# Patient Record
Sex: Female | Born: 1935
Health system: Southern US, Community
[De-identification: ages and names within clinical notes are randomized; demographics above are authoritative.]

## PROBLEM LIST (undated history)

## (undated) DIAGNOSIS — I10 Essential (primary) hypertension: Secondary | ICD-10-CM

## (undated) DIAGNOSIS — E7800 Pure hypercholesterolemia, unspecified: Secondary | ICD-10-CM

## (undated) DIAGNOSIS — M810 Age-related osteoporosis without current pathological fracture: Secondary | ICD-10-CM

## (undated) DIAGNOSIS — IMO0002 Reserved for concepts with insufficient information to code with codable children: Secondary | ICD-10-CM

## (undated) DIAGNOSIS — Z460 Encounter for fitting and adjustment of spectacles and contact lenses: Secondary | ICD-10-CM

## (undated) DIAGNOSIS — N63 Unspecified lump in unspecified breast: Secondary | ICD-10-CM

## (undated) DIAGNOSIS — I739 Peripheral vascular disease, unspecified: Secondary | ICD-10-CM

## (undated) DIAGNOSIS — N6019 Diffuse cystic mastopathy of unspecified breast: Secondary | ICD-10-CM

## (undated) DIAGNOSIS — E039 Hypothyroidism, unspecified: Secondary | ICD-10-CM

## (undated) DIAGNOSIS — R0989 Other specified symptoms and signs involving the circulatory and respiratory systems: Secondary | ICD-10-CM

## (undated) DIAGNOSIS — R9430 Abnormal result of cardiovascular function study, unspecified: Secondary | ICD-10-CM

## (undated) DIAGNOSIS — I2510 Atherosclerotic heart disease of native coronary artery without angina pectoris: Secondary | ICD-10-CM

## (undated) DIAGNOSIS — E785 Hyperlipidemia, unspecified: Secondary | ICD-10-CM

## (undated) DIAGNOSIS — K602 Anal fissure, unspecified: Secondary | ICD-10-CM

## (undated) DIAGNOSIS — I779 Disorder of arteries and arterioles, unspecified: Secondary | ICD-10-CM

## (undated) HISTORY — DX: Hypothyroidism, unspecified: E03.9

## (undated) HISTORY — DX: Other specified symptoms and signs involving the circulatory and respiratory systems: R09.89

## (undated) HISTORY — DX: Reserved for concepts with insufficient information to code with codable children: IMO0002

## (undated) HISTORY — DX: Age-related osteoporosis without current pathological fracture: M81.0

## (undated) HISTORY — DX: Pure hypercholesterolemia, unspecified: E78.00

## (undated) HISTORY — DX: Essential (primary) hypertension: I10

## (undated) HISTORY — DX: Unspecified lump in unspecified breast: N63.0

## (undated) HISTORY — DX: Disorder of arteries and arterioles, unspecified: I77.9

## (undated) HISTORY — DX: Hyperlipidemia, unspecified: E78.5

## (undated) HISTORY — DX: Diffuse cystic mastopathy of unspecified breast: N60.19

## (undated) HISTORY — PX: RECTOPERITONEAL FISTULA CLOSURE: SHX2314

## (undated) HISTORY — DX: Anal fissure, unspecified: K60.2

## (undated) HISTORY — DX: Abnormal result of cardiovascular function study, unspecified: R94.30

## (undated) HISTORY — DX: Atherosclerotic heart disease of native coronary artery without angina pectoris: I25.10

## (undated) HISTORY — DX: Peripheral vascular disease, unspecified: I73.9

## (undated) HISTORY — PX: THYROID CYST EXCISION: SHX2511

---

## 1991-07-08 HISTORY — PX: CORONARY ANGIOPLASTY: SHX604

## 1998-04-05 ENCOUNTER — Ambulatory Visit (HOSPITAL_COMMUNITY): Admission: RE | Admit: 1998-04-05 | Discharge: 1998-04-05 | Payer: Self-pay | Admitting: *Deleted

## 1998-05-02 ENCOUNTER — Other Ambulatory Visit: Admission: RE | Admit: 1998-05-02 | Discharge: 1998-05-02 | Payer: Self-pay | Admitting: *Deleted

## 1999-04-26 ENCOUNTER — Ambulatory Visit (HOSPITAL_COMMUNITY): Admission: RE | Admit: 1999-04-26 | Discharge: 1999-04-26 | Payer: Self-pay | Admitting: *Deleted

## 1999-04-26 ENCOUNTER — Encounter: Payer: Self-pay | Admitting: *Deleted

## 1999-06-03 ENCOUNTER — Other Ambulatory Visit: Admission: RE | Admit: 1999-06-03 | Discharge: 1999-06-03 | Payer: Self-pay | Admitting: *Deleted

## 2000-05-06 ENCOUNTER — Ambulatory Visit (HOSPITAL_COMMUNITY): Admission: RE | Admit: 2000-05-06 | Discharge: 2000-05-06 | Payer: Self-pay | Admitting: *Deleted

## 2000-05-06 ENCOUNTER — Encounter: Payer: Self-pay | Admitting: *Deleted

## 2000-07-01 ENCOUNTER — Other Ambulatory Visit: Admission: RE | Admit: 2000-07-01 | Discharge: 2000-07-01 | Payer: Self-pay | Admitting: *Deleted

## 2001-07-26 ENCOUNTER — Ambulatory Visit (HOSPITAL_COMMUNITY): Admission: RE | Admit: 2001-07-26 | Discharge: 2001-07-26 | Payer: Self-pay | Admitting: *Deleted

## 2001-07-26 ENCOUNTER — Encounter: Payer: Self-pay | Admitting: *Deleted

## 2001-08-18 ENCOUNTER — Other Ambulatory Visit: Admission: RE | Admit: 2001-08-18 | Discharge: 2001-08-18 | Payer: Self-pay | Admitting: *Deleted

## 2002-09-23 ENCOUNTER — Other Ambulatory Visit: Admission: RE | Admit: 2002-09-23 | Discharge: 2002-09-23 | Payer: Self-pay | Admitting: *Deleted

## 2002-09-28 ENCOUNTER — Encounter: Payer: Self-pay | Admitting: *Deleted

## 2002-09-28 ENCOUNTER — Ambulatory Visit (HOSPITAL_COMMUNITY): Admission: RE | Admit: 2002-09-28 | Discharge: 2002-09-28 | Payer: Self-pay | Admitting: *Deleted

## 2003-10-17 ENCOUNTER — Ambulatory Visit (HOSPITAL_COMMUNITY): Admission: RE | Admit: 2003-10-17 | Discharge: 2003-10-17 | Payer: Self-pay | Admitting: *Deleted

## 2003-10-18 ENCOUNTER — Other Ambulatory Visit: Admission: RE | Admit: 2003-10-18 | Discharge: 2003-10-18 | Payer: Self-pay | Admitting: *Deleted

## 2004-03-21 ENCOUNTER — Encounter: Admission: RE | Admit: 2004-03-21 | Discharge: 2004-03-21 | Payer: Self-pay | Admitting: Internal Medicine

## 2004-06-03 ENCOUNTER — Ambulatory Visit: Payer: Self-pay | Admitting: Endocrinology

## 2004-08-22 ENCOUNTER — Ambulatory Visit: Payer: Self-pay

## 2004-12-31 ENCOUNTER — Ambulatory Visit (HOSPITAL_COMMUNITY): Admission: RE | Admit: 2004-12-31 | Discharge: 2004-12-31 | Payer: Self-pay | Admitting: *Deleted

## 2005-03-27 ENCOUNTER — Ambulatory Visit: Payer: Self-pay

## 2005-05-06 ENCOUNTER — Ambulatory Visit: Payer: Self-pay | Admitting: Endocrinology

## 2005-05-09 ENCOUNTER — Ambulatory Visit (HOSPITAL_COMMUNITY): Admission: RE | Admit: 2005-05-09 | Discharge: 2005-05-09 | Payer: Self-pay | Admitting: Obstetrics and Gynecology

## 2005-05-26 ENCOUNTER — Ambulatory Visit: Payer: Self-pay | Admitting: Cardiology

## 2005-07-15 ENCOUNTER — Ambulatory Visit: Payer: Self-pay

## 2005-07-15 ENCOUNTER — Encounter: Payer: Self-pay | Admitting: Cardiology

## 2005-11-05 ENCOUNTER — Encounter: Payer: Self-pay | Admitting: *Deleted

## 2006-03-18 ENCOUNTER — Ambulatory Visit (HOSPITAL_COMMUNITY): Admission: RE | Admit: 2006-03-18 | Discharge: 2006-03-18 | Payer: Self-pay | Admitting: Obstetrics & Gynecology

## 2006-04-15 ENCOUNTER — Ambulatory Visit: Payer: Self-pay

## 2006-05-11 ENCOUNTER — Ambulatory Visit: Payer: Self-pay | Admitting: Endocrinology

## 2006-06-02 ENCOUNTER — Ambulatory Visit: Payer: Self-pay | Admitting: Cardiology

## 2007-04-05 ENCOUNTER — Encounter: Payer: Self-pay | Admitting: *Deleted

## 2007-04-05 DIAGNOSIS — E039 Hypothyroidism, unspecified: Secondary | ICD-10-CM | POA: Insufficient documentation

## 2007-04-05 DIAGNOSIS — E041 Nontoxic single thyroid nodule: Secondary | ICD-10-CM | POA: Insufficient documentation

## 2007-04-16 ENCOUNTER — Ambulatory Visit: Payer: Self-pay | Admitting: Internal Medicine

## 2007-05-18 ENCOUNTER — Encounter: Payer: Self-pay | Admitting: Cardiology

## 2007-05-18 ENCOUNTER — Ambulatory Visit: Payer: Self-pay

## 2007-05-21 ENCOUNTER — Ambulatory Visit: Payer: Self-pay | Admitting: Cardiology

## 2007-06-10 ENCOUNTER — Ambulatory Visit (HOSPITAL_COMMUNITY): Admission: RE | Admit: 2007-06-10 | Discharge: 2007-06-10 | Payer: Self-pay | Admitting: Obstetrics & Gynecology

## 2007-07-19 ENCOUNTER — Ambulatory Visit: Payer: Self-pay | Admitting: Internal Medicine

## 2007-07-19 DIAGNOSIS — N63 Unspecified lump in unspecified breast: Secondary | ICD-10-CM

## 2007-07-19 HISTORY — DX: Unspecified lump in unspecified breast: N63.0

## 2007-07-19 LAB — CONVERTED CEMR LAB
ALT: 29 units/L (ref 0–35)
Alkaline Phosphatase: 51 units/L (ref 39–117)
BUN: 19 mg/dL (ref 6–23)
Basophils Absolute: 0 10*3/uL (ref 0.0–0.1)
Basophils Relative: 0.1 % (ref 0.0–1.0)
Bilirubin Urine: NEGATIVE
CO2: 28 meq/L (ref 19–32)
Calcium: 9.6 mg/dL (ref 8.4–10.5)
Chloride: 105 meq/L (ref 96–112)
Creatinine, Ser: 0.7 mg/dL (ref 0.4–1.2)
Eosinophils Relative: 0.2 % (ref 0.0–5.0)
GFR calc Af Amer: 106 mL/min
Glucose, Bld: 109 mg/dL — ABNORMAL HIGH (ref 70–99)
Glucose, Urine, Semiquant: NEGATIVE
HCT: 38.4 % (ref 36.0–46.0)
Lymphocytes Relative: 15 % (ref 12.0–46.0)
MCV: 96.1 fL (ref 78.0–100.0)
Neutro Abs: 4.6 10*3/uL (ref 1.4–7.7)
Nitrite: NEGATIVE
Platelets: 277 10*3/uL (ref 150–400)
Potassium: 5.4 meq/L — ABNORMAL HIGH (ref 3.5–5.1)
TSH: 1 microintl units/mL (ref 0.35–5.50)
Total CHOL/HDL Ratio: 5
Total Protein: 6.8 g/dL (ref 6.0–8.3)
Urobilinogen, UA: 0.2
VLDL: 9 mg/dL (ref 0–40)
WBC: 5.8 10*3/uL (ref 4.5–10.5)
pH: 5

## 2007-07-21 ENCOUNTER — Encounter: Payer: Self-pay | Admitting: Internal Medicine

## 2007-07-21 ENCOUNTER — Ambulatory Visit: Payer: Self-pay | Admitting: Internal Medicine

## 2007-07-21 DIAGNOSIS — R7309 Other abnormal glucose: Secondary | ICD-10-CM | POA: Insufficient documentation

## 2007-08-05 ENCOUNTER — Other Ambulatory Visit: Admission: RE | Admit: 2007-08-05 | Discharge: 2007-08-05 | Payer: Self-pay | Admitting: Obstetrics & Gynecology

## 2007-08-09 ENCOUNTER — Telehealth: Payer: Self-pay | Admitting: Internal Medicine

## 2008-04-07 ENCOUNTER — Ambulatory Visit: Payer: Self-pay | Admitting: Internal Medicine

## 2008-04-07 DIAGNOSIS — M899 Disorder of bone, unspecified: Secondary | ICD-10-CM | POA: Insufficient documentation

## 2008-04-07 DIAGNOSIS — M949 Disorder of cartilage, unspecified: Secondary | ICD-10-CM

## 2008-04-07 DIAGNOSIS — R030 Elevated blood-pressure reading, without diagnosis of hypertension: Secondary | ICD-10-CM | POA: Insufficient documentation

## 2008-05-29 ENCOUNTER — Ambulatory Visit: Payer: Self-pay

## 2008-05-29 ENCOUNTER — Ambulatory Visit: Payer: Self-pay | Admitting: Cardiology

## 2008-07-20 ENCOUNTER — Ambulatory Visit (HOSPITAL_COMMUNITY): Admission: RE | Admit: 2008-07-20 | Discharge: 2008-07-20 | Payer: Self-pay | Admitting: Obstetrics & Gynecology

## 2008-08-23 ENCOUNTER — Ambulatory Visit: Payer: Self-pay | Admitting: Internal Medicine

## 2008-10-04 LAB — CONVERTED CEMR LAB
BUN: 18 mg/dL (ref 6–23)
Basophils Relative: 0.3 % (ref 0.0–3.0)
CO2: 29 meq/L (ref 19–32)
Calcium: 9.3 mg/dL (ref 8.4–10.5)
Chloride: 105 meq/L (ref 96–112)
Creatinine, Ser: 0.6 mg/dL (ref 0.4–1.2)
Direct LDL: 148.9 mg/dL
Eosinophils Absolute: 0 10*3/uL (ref 0.0–0.7)
GFR calc non Af Amer: 105 mL/min
Glucose, Bld: 93 mg/dL (ref 70–99)
HDL: 44.5 mg/dL (ref >39.0)
Hemoglobin: 13.4 g/dL (ref 12.0–15.0)
Hgb A1c MFr Bld: 5.6 % (ref 4.6–6.0)
Lymphocytes Relative: 27.4 % (ref 12.0–46.0)
Monocytes Absolute: 0.3 10*3/uL (ref 0.1–1.0)
Monocytes Relative: 7 % (ref 3.0–12.0)
Potassium: 4.9 meq/L (ref 3.5–5.1)
Sodium: 141 meq/L (ref 135–145)
Total Bilirubin: 1 mg/dL (ref 0.3–1.2)
VLDL: 14 mg/dL (ref 0–40)
WBC: 4.8 10*3/uL (ref 4.5–10.5)

## 2008-10-16 ENCOUNTER — Ambulatory Visit: Payer: Self-pay | Admitting: Internal Medicine

## 2008-12-06 ENCOUNTER — Ambulatory Visit: Payer: Self-pay | Admitting: Cardiology

## 2009-01-04 ENCOUNTER — Telehealth (INDEPENDENT_AMBULATORY_CARE_PROVIDER_SITE_OTHER): Payer: Self-pay | Admitting: *Deleted

## 2009-01-04 LAB — CONVERTED CEMR LAB: Pap Smear: NORMAL

## 2009-06-04 ENCOUNTER — Encounter: Payer: Self-pay | Admitting: Cardiology

## 2009-06-05 ENCOUNTER — Ambulatory Visit: Payer: Self-pay | Admitting: Cardiology

## 2009-07-27 ENCOUNTER — Ambulatory Visit: Payer: Self-pay | Admitting: Internal Medicine

## 2009-08-27 ENCOUNTER — Ambulatory Visit (HOSPITAL_COMMUNITY): Admission: RE | Admit: 2009-08-27 | Discharge: 2009-08-27 | Payer: Self-pay | Admitting: Obstetrics & Gynecology

## 2009-12-05 ENCOUNTER — Encounter: Payer: Self-pay | Admitting: Cardiology

## 2009-12-06 ENCOUNTER — Ambulatory Visit: Payer: Self-pay

## 2009-12-06 ENCOUNTER — Encounter: Payer: Self-pay | Admitting: Cardiology

## 2010-01-16 ENCOUNTER — Ambulatory Visit: Payer: Self-pay | Admitting: Internal Medicine

## 2010-01-21 LAB — CONVERTED CEMR LAB
Albumin: 4.5 g/dL (ref 3.5–5.2)
Alkaline Phosphatase: 54 units/L (ref 39–117)
CO2: 29 meq/L (ref 19–32)
Cholesterol: 204 mg/dL — ABNORMAL HIGH (ref 0–200)
GFR calc non Af Amer: 110.59 mL/min (ref >60)
Glucose, Bld: 104 mg/dL — ABNORMAL HIGH (ref 70–99)
HCT: 40.5 % (ref 36.0–46.0)
Lymphocytes Relative: 20.4 % (ref 12.0–46.0)
MCHC: 33.9 g/dL (ref 30.0–36.0)
MCV: 99.4 fL (ref 78.0–100.0)
Monocytes Relative: 6.4 % (ref 3.0–12.0)
Neutro Abs: 3.7 10*3/uL (ref 1.4–7.7)
Potassium: 5.3 meq/L — ABNORMAL HIGH (ref 3.5–5.1)
RBC: 4.07 M/uL (ref 3.87–5.11)
RDW: 13.2 % (ref 11.5–14.6)
TSH: 0.7 microintl units/mL (ref 0.35–5.50)
Total CHOL/HDL Ratio: 4
Triglycerides: 62 mg/dL (ref 0.0–149.0)
VLDL: 12.4 mg/dL (ref 0.0–40.0)

## 2010-06-19 ENCOUNTER — Ambulatory Visit: Payer: Self-pay | Admitting: Internal Medicine

## 2010-07-10 ENCOUNTER — Encounter: Payer: Self-pay | Admitting: Cardiology

## 2010-07-11 ENCOUNTER — Encounter: Payer: Self-pay | Admitting: Cardiology

## 2010-07-11 ENCOUNTER — Ambulatory Visit
Admission: RE | Admit: 2010-07-11 | Discharge: 2010-07-11 | Payer: Self-pay | Source: Home / Self Care | Attending: Cardiology | Admitting: Cardiology

## 2010-07-28 ENCOUNTER — Encounter: Payer: Self-pay | Admitting: Obstetrics & Gynecology

## 2010-08-08 NOTE — Miscellaneous (Signed)
  Clinical Lists Changes  Observations: Added new observation of PAST MED HX: CAD.... Marland KitchenPCI to the right coronary artery and diagonal in the remote past. Residual 70% LAD lesion....  /  Myoview 2007... no ischemia Hyperlipidemia Hypothyroidism Carotid artery disease.Marland KitchenMarland KitchenDoppler... June, 2010... 40-59% LICA, 60-79% RICA  /  doppler..12/2009..stable 60-79%RICA, 40-59% LICA Surgery for thyroid cyst in the past. Thyroid nodules followed by Dr. Everardo All Increased pulsation  in the abdomen.. no abdominal aneurysm by ultrasound EF   67% by Myoview scan January 2007 Hypertension.. white coat  Fosamax indigestion.  after hrt   age 76   (07/10/2010 15:53) Added new observation of PRIMARY MD: [name] MD (07/10/2010 15:53)       Past History:  Past Medical History: CAD.... Marland KitchenPCI to the right coronary artery and diagonal in the remote past. Residual 70% LAD lesion....  /  Myoview 2007... no ischemia Hyperlipidemia Hypothyroidism Carotid artery disease.Marland KitchenMarland KitchenDoppler... June, 2010... 40-59% LICA, 60-79% RICA  /  doppler..12/2009..stable 60-79%RICA, 40-59% LICA Surgery for thyroid cyst in the past. Thyroid nodules followed by Dr. Everardo All Increased pulsation  in the abdomen.. no abdominal aneurysm by ultrasound EF   67% by Myoview scan January 2007 Hypertension.. white coat  Fosamax indigestion.  after hrt   age 47

## 2010-08-08 NOTE — Assessment & Plan Note (Signed)
Summary: flu shot/cjr   Nurse Visit   Allergies: No Known Drug Allergies  Orders Added: 1)  Admin 1st Vaccine [90471] 2)  Flu Vaccine 53yrs + [95621]    Flu Vaccine Consent Questions     Do you have a history of severe allergic reactions to this vaccine? no    Any prior history of allergic reactions to egg and/or gelatin? no    Do you have a sensitivity to the preservative Thimersol? no    Do you have a past history of Guillan-Barre Syndrome? no    Do you currently have an acute febrile illness? no    Have you ever had a severe reaction to latex? no    Vaccine information given and explained to patient? yes    Are you currently pregnant? no    Lot Number:[id number]   Exp Date:01/04/2011   Site Given  Left Deltoid IM Romualdo Bolk, CMA (AAMA)  June 19, 2010 1:21 PM

## 2010-08-08 NOTE — Assessment & Plan Note (Signed)
Summary: emp/pt fasting/cjr   Vital Signs:  Patient profile:   76 year old female Menstrual status:  postmenopausal Height:      63 inches Weight:      111 pounds Pulse rate:   100 / minute BP sitting:   170 / 80  (left arm) Cuff size:   regular  Vitals Entered By: Romualdo Bolk, CMA (AAMA) (January 16, 2010 9:30 AM) CC: Annual Visit for Disease Management- No pap- Pt has a gyn who does paps   History of Present Illness: Natasha Harper cone ins for yearly check     1.   Risk factors based on Past M, S, F history: 2.   Physical Activities:  walk  swim  play  3.   Depression/mood:   Over all this . doing well 4.   Hearing:  good  5.   ADL's:   independent  does all housework  6.   Fall Risk:   none and no risk  wallks   about 2 miles each day.  7.   Home Safety:    no falls  8.   Height, weight, &visual acuity: vision  seeing     well  had vision check   9.   Counseling:  declines  colonscopy  10.   Labs ordered based on risk factors:  11.           Referral Coordination 12.           Care Plan 13.            Cognitive Assessment  Pt is A&Ox3,affect,speech,memory,attention,&motor skills appear intact.   Since last visit  here  there have been no major changes in health status   .  Bp  120 at home  very elevated in all office areas and cuff has been check ed also.  Dr Hyacinth Meeker stool card  get reg   checks  Thyoird no change in meds  LIPIDs: mds per Dr Myrtis Ser no reported se.      Preventive Care Screening  Mammogram:    Date:  11/04/2009    Results:  normal   Pap Smear:    Date:  01/04/2009    Results:  normal   Last Tetanus Booster:    Date:  07/07/2004    Results:  Historical    Contraindications/Deferment of Procedures/Staging:    Test/Procedure: Colonoscopy    Reason for deferment: patient declined   Preventive Screening-Counseling & Management  Alcohol-Tobacco     Alcohol drinks/day: <1     Alcohol type: wine      Smoking Status: quit     Year  Quit: 45 years ago  Caffeine-Diet-Exercise     Caffeine use/day: less than 1 a year     Does Patient Exercise: yes     Type of exercise: walking     Times/week: 7  Hep-HIV-STD-Contraception     Dental Visit-last 6 months yes     Sun Exposure-Excessive: no  Safety-Violence-Falls     Seat Belt Use: yes     Firearms in the Home: no firearms in the home     Smoke Detectors: yes     Fall Risk: no risk     Fall Risk Counseling: not indicated; no significant falls noted      Blood Transfusions:  no.    Current Medications (verified): 1)  Lipitor 80 Mg  Tabs (Atorvastatin Calcium) .... Take 1 By Mouth Qd 2)  Levothyroxine Sodium 50 Mcg  Tabs (  Levothyroxine Sodium) .... Take 1 By Mouth Qd 3)  Baby Aspirin 81 Mg  Chew (Aspirin) 4)  One-A-Day Extras Antioxidant   Caps (Multiple Vitamins-Minerals) 5)  Fish Oil   Oil (Fish Oil) .... Once Daily  Allergies (verified): No Known Drug Allergies  Past History:  Past medical, surgical, family and social histories (including risk factors) reviewed, and no changes noted (except as noted below).  Past Medical History: CAD.... Marland KitchenPCI to the right coronary artery and diagonal in the remote past. Residual 70% LAD lesion....  /  Myoview 2007... no ischemia Hyperlipidemia Hypothyroidism Carotid artery disease.Marland KitchenMarland KitchenDoppler... June, 2010... 40-59% LICA, 60-79% RICA Surgery for thyroid cyst in the past. Thyroid nodules followed by Dr. Everardo All Increased pulsation  in the abdomen.. no abdominal aneurysm by ultrasound EF   67% by Myoview scan January 2007 Hypertension.. white coat  Fosamax indigestion.  after hrt   age 48   Past Surgical History: Reviewed history from 04/07/2008 and no changes required. Removed thyroid cyst   1980s Percutaneous transluminal coronary angioplasty Stress Cardiolite (07/15/2005)  Past History:  Care Management: Cardiology Myrtis Ser Endocrinology: Everardo All Gynecology: Hyacinth Meeker  Family History: Reviewed history from 04/07/2008  and no changes required. Father died 73's MI Mom died 34 in her sleep.  No fam hx DM  No Cancer.  Osteoporosis and falling in mom  Social History: Reviewed history from 10/16/2008 and no changes required. Former Smoker  stopped in the 60s  Alcohol use-yes  1/ week .   Drug use-no Regular exercise-yes 2 miles  per days. Married sleep  is good  HH of 2    no pets  family was from [place]. Caffeine use/day:  less than 1 a year Seat Belt Use:  yes Dental Care w/in 6 mos.:  yes Sun Exposure-Excessive:  no Fall Risk:  no risk Blood Transfusions:  no  Review of Systems  The patient denies anorexia, fever, weight loss, weight gain, vision loss, decreased hearing, dyspnea on exertion, prolonged cough, abdominal pain, melena, hematochezia, severe indigestion/heartburn, hematuria, transient blindness, difficulty walking, depression, enlarged lymph nodes, angioedema, and breast masses.    Physical Exam  General:  Well-developed,well-nourished,in no acute distress; alert,appropriate and cooperative throughout examination Head:  normocephalic and atraumatic.   Eyes:  vision grossly intact and pupils equal.  eoms nl Ears:  R ear normal, L ear normal, and no external deformities.   Nose:  no external deformity, no external erythema, and no nasal discharge.   Mouth:  pharynx pink and moist.   Neck:  supple.  no new masses  Breasts:  No mass, nodules, thickening, tenderness, bulging, retraction, inflamation, nipple discharge or skin changes noted.   Lungs:  Normal respiratory effort, chest expands symmetrically. Lungs are clear to auscultation, no crackles or wheezes.no dullness.   Heart:  Normal rate and regular rhythm. S1 and S2 normal without gallop, murmur, click, rub or other extra sounds.no lifts.   Abdomen:  Bowel sounds positive,abdomen soft and non-tender without masses, organomegaly or  noted. Genitalia:  per gyne Msk:  normal ROM, no joint warmth, and no redness over joints.     Pulses:  nl pedal pulses without delay or bruit  Extremities:  no clubbing cyanosis or edema  Neurologic:  non focal  alert & oriented X3, gait normal, and DTRs symmetrical and normal.   Pt is A&Ox3,affect,speech,memory,attention,&motor skills appear intact.  Skin:  turgor normal, color normal, no ecchymoses, and no petechiae.   Cervical Nodes:  No lymphadenopathy noted Axillary Nodes:  No palpable lymphadenopathy  Psych:  Oriented X3, normally interactive, good eye contact, and minimally  anxious.     Impression & Recommendations:  Problem # 1:  PREVENTIVE HEALTH CARE (ICD-V70.0) counseled   declines colon   Problem # 2:  HYPERTENSION, WHITE COAT (ICD-796.2) see notes  from past  pat  again reports   normal readings at home or when not in a medical facility   Orders: TLB-BMP (Basic Metabolic Panel-BMET) (80048-METABOL)  Problem # 3:  HYPOTHYROIDISM (ICD-244.9)  Her updated medication list for this problem includes:    Levothyroxine Sodium 50 Mcg Tabs (Levothyroxine sodium) .Marland Kitchen... Take 1 by mouth qd  Orders: TLB-TSH (Thyroid Stimulating Hormone) (84443-TSH) Venipuncture (41324) Prescription Created Electronically [phone])  Labs Reviewed: TSH: 0.96 (08/23/2008)    HgBA1c: 5.6 (08/23/2008) Chol: 210 (08/23/2008)   HDL: 44.5 (08/23/2008)   LDL: DEL (08/23/2008)   TG: 69 (08/23/2008)  Problem # 4:  THYROID NODULE (ICD-241.0)  Orders: TLB-TSH (Thyroid Stimulating Hormone) (84443-TSH)  Problem # 5:  HYPERLIPIDEMIA (ICD-272.4)  Her updated medication list for this problem includes:    Lipitor 80 Mg Tabs (Atorvastatin calcium) .Marland Kitchen... Take 1 by mouth qd  Orders: TLB-Hepatic/Liver Function Pnl (80076-HEPATIC) TLB-Lipid Panel (80061-LIPID) Venipuncture (72536)  Labs Reviewed: SGOT: 28 (08/23/2008)   SGPT: 29 (08/23/2008)   HDL:44.5 (08/23/2008), 42.6 (07/19/2007)  LDL:DEL (08/23/2008), DEL (07/19/2007)  Chol:210 (08/23/2008), 213 (07/19/2007)  Trig:69 (08/23/2008), 44  (07/19/2007)  Problem # 6:  CORONARY ARTERY DISEASE (ICD-414.00)  Her updated medication list for this problem includes:    Baby Aspirin 81 Mg Chew (Aspirin)  Orders: TLB-Lipid Panel (80061-LIPID)  Labs Reviewed: Chol: 210 (08/23/2008)   HDL: 44.5 (08/23/2008)   LDL: DEL (08/23/2008)   TG: 69 (08/23/2008)  Problem # 7:  bone health high risk with family hx and age habitus and thyroid disease but she will not take meds for this  so    dexa t done by her GYne    . consider dexa   for information .  Complete Medication List: 1)  Lipitor 80 Mg Tabs (Atorvastatin calcium) .... Take 1 by mouth qd 2)  Levothyroxine Sodium 50 Mcg Tabs (Levothyroxine sodium) .... Take 1 by mouth qd 3)  Baby Aspirin 81 Mg Chew (Aspirin) 4)  One-a-day Extras Antioxidant Caps (Multiple vitamins-minerals) 5)  Fish Oil Oil (Fish oil) .... Once daily  Other Orders: TLB-CBC Platelet - w/Differential (85025-CBCD) T-Vitamin D (25-Hydroxy) (64403-47425)  Patient Instructions: 1)  You will be informed of lab results when available.  2)  Continue minitoring your Blood pressure  and call or follow up if elevated 150 and above .  3)  Continue healthy eating and exercise . Prescriptions: LEVOTHYROXINE SODIUM 50 MCG  TABS (LEVOTHYROXINE SODIUM) take 1 by mouth qd  #90 Tablet x 3   Entered and Authorized by:   [name] MD   Signed by:   [name] MD on 01/16/2010   Method used:   Electronically to        [facility]* (retail)             ,          Ph: [phone]       Fax: [phone]   RxID:   [id number] LIPITOR 80 MG  TABS (ATORVASTATIN CALCIUM) take 1 by mouth qd  #90 x 3   Entered and Authorized by:   [name] MD   Signed by:   [name] MD on 01/16/2010   Method used:  Electronically to        [facility]* (retail)             ,          Ph: [phone]       Fax: [phone]   RxID:   [id number]    Eye Exam  Last eye exam 2 years ago- Normal- Done in  [place]. Romualdo Bolk, CMA (AAMA)  January 16, 2010 9:36 AM

## 2010-08-08 NOTE — Assessment & Plan Note (Signed)
Summary: flu shot/cjr   Nurse Visit   Allergies: No Known Drug Allergies  Orders Added: 1)  Admin 1st Vaccine [90471] 2)  Flu Vaccine 19yrs + [40347]   Flu Vaccine Consent Questions     Do you have a history of severe allergic reactions to this vaccine? no    Any prior history of allergic reactions to egg and/or gelatin? no    Do you have a sensitivity to the preservative Thimersol? no    Do you have a past history of Guillan-Barre Syndrome? no    Do you currently have an acute febrile illness? no    Have you ever had a severe reaction to latex? no    Vaccine information given and explained to patient? yes    Are you currently pregnant? no    Lot Number:[id number]   Exp Date:01/03/2010   Site Given  Left Deltoid IM Romualdo Bolk, CMA [facility])  July 27, 2009 11:34 AM

## 2010-08-08 NOTE — Assessment & Plan Note (Signed)
Summary: Z6X      Allergies Added: NKDA  Visit Type:  Follow-up Primary Provider:  [name] MD  CC:  CAD.  History of Present Illness: The patient is seen for followup of coronary artery disease.  She has done very well.  I saw her last November, 2010.  In June, 2011, she had carotid Doppler showing stable disease.  She's not having any chest pain.  Her Myoview was done last in 2007.  There is normal LV function.  Current Medications (verified): 1)  Lipitor 80 Mg  Tabs (Atorvastatin Calcium) .... Take 1 By Mouth Qd 2)  Levothyroxine Sodium 50 Mcg  Tabs (Levothyroxine Sodium) .... Take 1 By Mouth Qd 3)  Baby Aspirin 81 Mg  Chew (Aspirin) 4)  One-A-Day Extras Antioxidant   Caps (Multiple Vitamins-Minerals) 5)  Fish Oil   Oil (Fish Oil) .... Once Daily  Allergies (verified): No Known Drug Allergies  Past History:  Past Medical History: CAD.... Marland KitchenPCI to the right coronary artery and diagonal in the remote past. Residual 70% LAD lesion....  /  Myoview 2007... no ischemia Hyperlipidemia .Marland KitchenMarland KitchenLDL remains high on Lipitor 80, but patient had stent she'll drop in LDL from Lipitor Hypothyroidism Carotid artery disease.Marland KitchenMarland KitchenDoppler... June, 2010... 40-59% LICA, 60-79% RICA  /  doppler..12/2009..stable 60-79%RICA, 40-59% LICA Surgery for thyroid cyst in the past. Thyroid nodules followed by Dr. Everardo All Increased pulsation  in the abdomen.. no abdominal aneurysm by ultrasound EF   67% by Myoview scan January 2007 Hypertension.. white coat  Fosamax indigestion.  after hrt   age 42   Review of Systems       Patient denies fever, chills, headache, sweats, rash, change in vision, change in hearing, chest pain, cough, nausea vomiting, urinary symptoms.  All other systems are reviewed and are negative  Vital Signs:  Patient profile:   76 year old female Menstrual status:  postmenopausal Height:      63 inches Weight:      112 pounds BMI:     19.91 Pulse rate:   83 / minute BP sitting:    154 / 62  (left arm) Cuff size:   regular  Vitals Entered By: Hardin Negus, RMA (July 11, 2010 9:55 AM)  Physical Exam  General:  patient looks great. Eyes:  no xanthelasma. Neck:  no jugular venous distention. Lungs:  lungs are clear.  Respiratory effort is nonlabored. Heart:  cardiac exam reveals S1 and S2.  No clicks or significant murmurs. Abdomen:  abdomen soft. Extremities:  no peripheral edema. Psych:  patient is oriented to person time and place.  Affect is normal.   Impression & Recommendations:  Problem # 1:  HYPERTENSION, WHITE COAT (ICD-796.2) Blood pressure was 120 systolic at home this morning.  She has white coat hypertension.  Her systolic of 154 here in the office today is better than usual.  No change in therapy.  Problem # 2:  HYPOTHYROIDISM (ICD-244.9)  Her updated medication list for this problem includes:    Levothyroxine Sodium 50 Mcg Tabs (Levothyroxine sodium) .Marland Kitchen... Take 1 by mouth qd Thyroid is treated.  Problem # 3:  CAROTID ARTERY STENOSIS, WITHOUT INFARCTION (ICD-433.10)  Her updated medication list for this problem includes:    Baby Aspirin 81 Mg Chew (Aspirin) Carotids are followed very carefully with Dopplers.  Problem # 4:  HYPERLIPIDEMIA (ICD-272.4)  Her updated medication list for this problem includes:    Lipitor 80 Mg Tabs (Atorvastatin calcium) .Marland Kitchen... Take 1 by mouth qd  The patient takes Lipitor 80 mg.  When this was started in the past she had a very high percentage dropped and her LDL.  Her LDL remains elevated but I feel that we should not change medicines further.  Problem # 5:  CORONARY ARTERY DISEASE (ICD-414.00)  Her updated medication list for this problem includes:    Baby Aspirin 81 Mg Chew (Aspirin)  Orders: EKG w/ Interpretation (93000) Coronary disease is stable.  EKG is done today and reviewed by me.  There is no significant abnormality.  No further workup is needed.  I'll see her back in one year.  Patient  Instructions: 1)  Your physician wants you to follow-up in:  1 year.  You will receive a reminder letter in the mail two months in advance. If you don't receive a letter, please call our office to schedule the follow-up appointment.

## 2010-08-08 NOTE — Miscellaneous (Signed)
Summary: Orders Update  Clinical Lists Changes  Orders: Added new Test order of Carotid Duplex (Carotid Duplex) - Signed 

## 2010-11-19 NOTE — Assessment & Plan Note (Signed)
[facility]                            CARDIOLOGY OFFICE NOTE   NAME:Breighner, ROBERTTA HALFHILL                      MRN:          [id number]  DATE:05/29/2008                            DOB:          08/03/1934    Ms. Graul is doing very well.  She is not having any significant  symptoms.  She has known coronary disease post intervention of the right  and diagonal in the past with residual LAD disease.  In 2007, she had a  negative Myoview scan.  We are following her carotids.  She is not  having any significant chest pain.  She is on high-dose Lipitor.  She is  on aspirin.   The patient is having no chest pain or shortness of breath.  She has had  no syncope or presyncope.   ALLERGIES:  No known drug allergies.   MEDICATIONS:  1. Lipitor 80.  2. Thyroxine 50 mcg.  3. Multivitamin.  4. Aspirin.  5. Fish oil.   OTHER MEDICAL PROBLEMS:  See the list on my note of May 21, 2007,  and see the EMR flow sheet.   REVIEW OF SYSTEMS:  She is not having any fevers or chills.  She has had  no skin rashes.  She has no GI or GU symptoms.   PHYSICAL EXAMINATION:  The patient brings blood pressures from home  today.  She has elevated pressure when she comes into the office, but  her pressures at home are very clearly documented in the normal range.  I have several days worth of pressures.  Her pressures run in the range  of 115-125 over 60-75.  This is quite good, and there is no reason to  change her meds.  The patient is oriented to person, time, and place.  Affect is normal.  HEENT reveals no xanthelasma.  She has normal  extraocular motion.  There are no carotid bruits.  There is no jugular  venous distention.  Lungs are clear.  Respiratory effort is not labored.  Cardiac exam reveals an S1 with an S2.  There are no clicks or  significant murmurs.  Her abdomen is soft.  She has no peripheral edema.   EKG reveals no significant change.   Ms. Schelling is  quite stable.  Her coronary disease is stable.  Her  problems are listed on the note of May 21, 2007.  No change in her  meds.     Luis Abed, MD, [facility]  Electronically Signed    JDK/MedQ  DD: 05/29/2008  DT: 05/29/2008  Job #: [id number]   cc:   Neta Mends. Fabian Sharp, MD

## 2010-11-19 NOTE — Assessment & Plan Note (Signed)
[facility]                            CARDIOLOGY OFFICE NOTE   NAME:Natasha Harper, Natasha Harper                      MRN:          [id number]  DATE:05/21/2007                            DOB:          February 07, 1935    Natasha Harper is doing well.  She has known coronary disease but has been  very stable.  She had an intervention of the right coronary in the past.  She has residual 70% LAD lesion.  She is not having chest pain.  Her  Myoview in January 2007, showed no ischemia.  She did receive aggressive  secondary prevention.   There has been a question of elevated blood pressure.  She insists that  she becomes anxious only when she comes to the physician's office.  The  rest of the time she has no problem.  In fact, when she had her carotid  Doppler, her systolic was in the range of 140.  Today her blood pressure  is clearly elevated.  We will encourage her to obtain a home blood  pressure cuff and to call in her pressures to [place] and to comes back for  followup pressures and then she will give this information to Dr. Fabian Sharp  and we can decide going forward if she needs to be treated.   PAST MEDICAL HISTORY:   ALLERGIES:  No known drug allergies.   MEDICATIONS:  1. Lipitor 80.  2. Thyroid 50 mcg.  3. Multivitamin.  4. Aspirin 81.   OTHER MEDICAL PROBLEMS:  See the list below.   REVIEW OF SYSTEMS:  See the HPI.  Her review of systems otherwise is  negative.   PHYSICAL EXAMINATION:  VITAL SIGNS:  Blood pressure when first taken was  180/70.  See the discussion above.  Her pulse is 85.  NEUROLOGIC:  The patient is oriented to person, time, and place.  Affect  is normal.  HEENT:  Reveals no xanthelasma.  She has normal extraocular motion.  NECK:  There is no jugular venous distention.  She has a soft right  carotid bruit.  LUNGS:  Clear.  Respiratory effort is not labored.  CARDIAC:  Reveals an S1 with an S2.  There are no clicks or significant  murmurs.  ABDOMEN:  Soft.  She has normal bowel sounds.  EXTREMITIES:  She has no peripheral edema.   EKG today is normal with normal sinus rhythm.  She had carotid dopplers  on May 21, 2007.  These show that her disease is stable.  She had  60-79% right carotid stenoses, 40-59% left carotid stenosis.   PROBLEMS:  1. Coronary disease.  She is post percutaneous coronary intervention      to the right coronary artery and the diagonal in the past with a      residual 70% left anterior descending artery lesion but no ischemia      in January 2007.  She has no symptoms.  Secondary prevention is      indicated.  2. Elevated cholesterol.  She is on high dose Lipitor.  3. History of surgery for a thyroid cyst in  the past.  She has thyroid      nodules that are followed by Dr. Everardo All.  4. Carotid stenosis.  See the data above.  This is stable and she will      have followup Dopplers in one year.  5. Question of a mild increased pulsation in her abdomen in the past      and her ultrasound shows no aneurysm of her aorta.  6. Blood pressure.  We need to be sure that she is not hypertensive at      home.  She will obtain a blood pressure cuff and obtain blood      pressures and call them in to [place] and come back for a followup      visit.     Natasha Abed, MD, [facility]  Electronically Signed    JDK/MedQ  DD: 05/21/2007  DT: 05/21/2007  Job #: [id number]   cc:   Neta Mends. Fabian Sharp, MD

## 2010-11-22 NOTE — Assessment & Plan Note (Signed)
[facility]                              CARDIOLOGY OFFICE NOTE   NAME:Humm, Natasha Harper                      MRN:          [id number]  DATE:06/02/2006                            DOB:          [birth year]    Natasha Harper is doing well.  She saw Dr. Everardo All recently for the follow up of  her thyroid.  She is not having any chest pain.  She does have known  coronary disease.  The patient underwent a Myoview scan in January of 2007.  She walked on the treadmill for 8 minutes.  She had no chest pain.  There  was no significant EKG change.  The nuclear images were normal.  Her  ejection fraction was normal.  She has had follow up of her carotids.  On  April 15, 2006, she has stable carotid disease with 60-79% on the right  and 40-59% on the left.  Last year we also did an abdominal ultrasound to be  sure that she had no abdominal aortic aneurysm and she had a normal caliber  aorta.  She did have calcified plaque in the aorta.   She has not had any shortness of breath or chest pain.  She is walking  regularly.  She has had no syncope or presyncope.   PAST MEDICAL HISTORY:  ALLERGIES:  NO KNOWN DRUG ALLERGIES.   Medications:  1. Lipitor 80.  2. Vitamin.  3. Aspirin 81.  4. Thyroxine 50 mcg.   Other Medical Problems:  See the list below.   REVIEW OF SYSTEMS:  She is really doing fine.  She has no complaints.  Her  review of systems is negative.   PHYSICAL EXAM:  Her weight is 121 pounds, which is stable.  Blood pressure  is 150/74.  This is somewhat increased from the past.  She says that she  feels nervous when she sees any doctors.  However, her blood pressures have  been lower in the past.  Also, her blood pressure was 173/69 when she saw  Dr. Everardo All on May 11, 2006.  Her pulse rate is 83.  Patient is oriented  to person, time and place and her affect is normal.  She does have carotid  bruit.  She has known carotid disease.  There is no  jugular venous  distention.  LUNGS:  Clear.  RESPIRATORY EFFORT:  Not labored.  CARDIAC:  An S1 with an S2.  She has no clicks or significant murmurs.  ABDOMEN:  Soft.  There is no masses or bruits.  She has 2+ distal pulses.  There is no edema.  She has no major musculoskeletal deformities.   EKG reveals no significant abnormalities.  She does have sinus rhythm.   PROBLEMS:  1. Coronary artery disease.  She had percutaneous coronary intervention to      the right coronary artery and a diagonal in the past.  She has residual      70% left anterior descending disease but is asymptomatic and her      Myoview in January of 2007 showed no ischemia.  Aggressive  secondary      prevention is in order.  She is on Lipitor 80 and she is on aspirin.  2. Elevated cholesterol.  Once again, she is on Lipitor.  3. History of surgery for a thyroid cyst in the past.  Also, she has      thyroid nodules and her overall thyroid status is followed by Dr.      Everardo All.  4. Significant carotid stenoses.  As mentioned, her carotids are stable by      Doppler and this needs to be followed yearly.  5. Question of a mild increased pulsation in her abdomen in the past.  Her      abdominal ultrasound showed no aneurysm.  6. Elevated blood pressure.  The patient will be seeing Dr. Fabian Sharp soon.      With these higher blood pressures, I suspect that medications do need      to be added.  In particular, with known coronary disease and carotid      disease adding an ACE inhibitor or an ARB would most likely be quite      prudent. I have not started the medicine today.  However, I talked with      the patient about discussing this with Dr. Fabian Sharp.     Luis Abed, MD, [facility]  Electronically Signed    JDK/MedQ  DD: 06/02/2006  DT: 06/02/2006  Job #: [id number]   cc:   Neta Mends. Fabian Sharp, MD

## 2010-12-13 ENCOUNTER — Other Ambulatory Visit: Payer: Self-pay | Admitting: Cardiology

## 2010-12-13 DIAGNOSIS — I6529 Occlusion and stenosis of unspecified carotid artery: Secondary | ICD-10-CM

## 2010-12-17 ENCOUNTER — Other Ambulatory Visit: Payer: Self-pay | Admitting: *Deleted

## 2010-12-17 ENCOUNTER — Encounter (INDEPENDENT_AMBULATORY_CARE_PROVIDER_SITE_OTHER): Payer: Medicare Other | Admitting: *Deleted

## 2010-12-17 DIAGNOSIS — I6529 Occlusion and stenosis of unspecified carotid artery: Secondary | ICD-10-CM

## 2010-12-18 ENCOUNTER — Encounter: Payer: Self-pay | Admitting: Cardiology

## 2011-03-03 ENCOUNTER — Other Ambulatory Visit: Payer: Self-pay | Admitting: Internal Medicine

## 2011-03-31 ENCOUNTER — Other Ambulatory Visit: Payer: Self-pay | Admitting: Internal Medicine

## 2011-06-02 ENCOUNTER — Encounter: Payer: Self-pay | Admitting: Internal Medicine

## 2011-06-02 ENCOUNTER — Ambulatory Visit (INDEPENDENT_AMBULATORY_CARE_PROVIDER_SITE_OTHER): Payer: Medicare Other | Admitting: Internal Medicine

## 2011-06-02 VITALS — BP 180/80 | HR 88 | Ht 63.0 in | Wt 111.0 lb

## 2011-06-02 DIAGNOSIS — R7309 Other abnormal glucose: Secondary | ICD-10-CM

## 2011-06-02 DIAGNOSIS — Z0000 Encounter for general adult medical examination without abnormal findings: Secondary | ICD-10-CM | POA: Insufficient documentation

## 2011-06-02 DIAGNOSIS — I2510 Atherosclerotic heart disease of native coronary artery without angina pectoris: Secondary | ICD-10-CM

## 2011-06-02 DIAGNOSIS — R030 Elevated blood-pressure reading, without diagnosis of hypertension: Secondary | ICD-10-CM

## 2011-06-02 DIAGNOSIS — E785 Hyperlipidemia, unspecified: Secondary | ICD-10-CM

## 2011-06-02 DIAGNOSIS — T887XXA Unspecified adverse effect of drug or medicament, initial encounter: Secondary | ICD-10-CM | POA: Insufficient documentation

## 2011-06-02 DIAGNOSIS — E039 Hypothyroidism, unspecified: Secondary | ICD-10-CM

## 2011-06-02 DIAGNOSIS — Z23 Encounter for immunization: Secondary | ICD-10-CM

## 2011-06-02 LAB — LIPID PANEL
Total CHOL/HDL Ratio: 4
VLDL: 11 mg/dL (ref 0.0–40.0)

## 2011-06-02 LAB — CBC WITH DIFFERENTIAL/PLATELET
Basophils Absolute: 0 10*3/uL (ref 0.0–0.1)
Basophils Relative: 0.4 % (ref 0.0–3.0)
Eosinophils Absolute: 0 10*3/uL (ref 0.0–0.7)
Eosinophils Relative: 0.1 % (ref 0.0–5.0)
Hemoglobin: 13.4 g/dL (ref 12.0–15.0)
Lymphs Abs: 1 10*3/uL (ref 0.7–4.0)
Monocytes Absolute: 0.2 10*3/uL (ref 0.1–1.0)
Neutro Abs: 3.6 10*3/uL (ref 1.4–7.7)
Platelets: 235 10*3/uL (ref 150.0–400.0)
WBC: 4.8 10*3/uL (ref 4.5–10.5)

## 2011-06-02 LAB — HEPATIC FUNCTION PANEL
Bilirubin, Direct: 0 mg/dL (ref 0.0–0.3)
Total Bilirubin: 0.8 mg/dL (ref 0.3–1.2)

## 2011-06-02 LAB — BASIC METABOLIC PANEL
Calcium: 9.3 mg/dL (ref 8.4–10.5)
Chloride: 104 mEq/L (ref 96–112)
Creatinine, Ser: 0.7 mg/dL (ref 0.4–1.2)
Glucose, Bld: 103 mg/dL — ABNORMAL HIGH (ref 70–99)

## 2011-06-02 LAB — CK: Total CK: 71 U/L (ref 7–177)

## 2011-06-02 NOTE — Assessment & Plan Note (Signed)
BP ate 128 121/60 - 70

## 2011-06-02 NOTE — Progress Notes (Signed)
Subjective:    Patient ID: Natasha Harper, female    DOB: 10/05/1934, 76 y.o.   MRN: [id number]  HPI Patient comes in today for follow up of  multiple medical problems.  And wellness visit  HT reading good at home comes with readings in the 120 range  Feels well no cp sob claudication  LIPIDS: change to generic liptor  felt different and   Less energy. Wrists and ankles hurt .  And some muscles?  Onset 2 weeks after changing brand.  Last year .  Want to go back to brand cause of poss se   inhaled corticosteroids on high dose per Dr Myrtis Ser     Hearing:  Ok   Vision:  No limitations at present .  Safety:  Has smoke detector and wears seat belts.  No firearms. No excess sun exposure. Sees dentist regularly.  Falls: no   Advance directive :  Reviewed  Has one.  Memory: Felt to be good  , no concern from her or her family.  Depression: No anhedonia unusual crying or depressive symptoms  Nutrition: Eats well balanced diet; adequate calcium and vitamin D. No swallowing chewiing problems.  Injury: no major injuries in the last six months.  Other healthcare providers:  Reviewed today .  Social:  Lives with husband married. No pets.   Preventive parameters: up-to-date on colonoscopy, mammogram, immunizations. Including Tdap and pneumovax.  ADLS:   There are no problems or need for assistance  driving, feeding, obtaining food, dressing, toileting and bathing, managing money using phone. She is independent.   Outpatient Encounter Prescriptions as of 06/02/2011  Medication Sig Dispense Refill  . Ascorbic Acid (VITAMIN C) 100 MG tablet Take 100 mg by mouth daily.        Marland Kitchen aspirin 81 MG tablet Take 81 mg by mouth daily.        Marland Kitchen atorvastatin (LIPITOR) 80 MG tablet        . fish oil-omega-3 fatty acids 1000 MG capsule Take 2 g by mouth daily.        Marland Kitchen levothyroxine (SYNTHROID, LEVOTHROID) 50 MCG tablet TAKE 1 TABLET DAILY  90 tablet  2  . MULTIPLE VITAMIN PO Take by mouth.        .  DISCONTD: atorvastatin (LIPITOR) 80 MG tablet TAKE 1 TABLET DAILY  90 tablet  1     Review of Systems ROS:  GEN/ HEENTNo fever, significant weight changes sweats headaches vision problems hearing changes, CV/ PULM; No chest pain shortness of breath cough, syncope,edema  change in exercise tolerance. GI /GU: No adominal pain, vomiting, change in bowel habits. No blood in the stool. No significant GU symptoms. SKIN/HEME: ,no acute skin rashes suspicious lesions or bleeding. No lymphadenopathy, nodules, masses.  NEURO/ PSYCH:  No neurologic signs such as weakness numbness No depression anxiety. IMM/ Allergy: No unusual infections.  Allergy .   no REST of 12 system review negative  Past history family history social history reviewed in the electronic medical record.  Centricity; not yet abstracted  In to EPIC      Objective:   Physical Exam Physical Exam: Vital signs reviewed BJY:NWGN is a well-developed well-nourished alert cooperative  white female who appears her stated age in no acute distress.  HEENT: normocephalic  traumatic , Eyes: PERRL EOM's full, conjunctiva clear, Nares: paten,t no deformity discharge or tenderness., Ears: no deformity EAC's clear TMs with normal landmarks. Mouth: clear OP, no lesions, edema.  Moist mucous membranes. Dentition in  adequate repair. NECK: supple without masses, thyromegaly or bruits. CHEST/PULM:  Clear to auscultation and percussion breath sounds equal no wheeze , rales or rhonchi. No chest wall deformities or tenderness. CV: PMI is nondisplaced, S1 S2 no gallops, murmurs, rubs. Peripheral pulses are full without delay.No JVD .  180/60  Breast: normal by inspection . No dimpling, discharge, , tenderness or discharge . lumpiness both breast right more than left.   ABDOMEN: Bowel sounds normal nontender  No guard or rebound, no hepato splenomegal no CVA tenderness.  No hernia. Extremtities:  No clubbing cyanosis or edema, no acute joint swelling or  redness no focal atrophy NEURO:  Oriented x3, cranial nerves 3-12 appear to be intact, no obvious focal weakness,gait within normal limits no abnormal reflexes or asymmetrical SKIN: No acute rashes normal turgor, color, no bruising or petechiae. PSYCH: Oriented, good eye contact, no obvious depression anxiety, cognition and judgment appear normal.      Assessment & Plan:  Preventive Health Care Counseled regarding healthy nutrition, exercise, sleep, injury prevention, calcium vit d and healthy wellness.  Marland KitchenLIPIDS  Poss se of meds changed to generic ok to change back and see how she does and check vit d in meantime,  WC  HT    Documented in past  Continuing.   Document.  Hypothyroid    No change in meds  Check levels is apparently now on generic .   Lumpy breasts .

## 2011-06-02 NOTE — Patient Instructions (Addendum)
Will notify you  of labs when available.  Ok to go back to brand lipitor Bring in blood pressure monitor to your cards visit. Continue lifestyle intervention healthy eating and exercise . Otherwise yearly check  However if  Muscle aches dont go away call  Here or dr Myrtis Ser office for advice

## 2011-06-03 LAB — SEDIMENTATION RATE: Sed Rate: 23 mm/hr — ABNORMAL HIGH (ref 0–22)

## 2011-06-03 LAB — VITAMIN D 25 HYDROXY (VIT D DEFICIENCY, FRACTURES): Vit D, 25-Hydroxy: 44 ng/mL (ref 30–89)

## 2011-06-05 NOTE — Progress Notes (Signed)
Quick Note:  Left message to call back. ______ 

## 2011-07-29 ENCOUNTER — Encounter: Payer: Self-pay | Admitting: *Deleted

## 2011-08-05 ENCOUNTER — Other Ambulatory Visit: Payer: Self-pay | Admitting: Internal Medicine

## 2011-08-05 MED ORDER — LEVOTHYROXINE SODIUM 50 MCG PO TABS: 50.0000 ug | ORAL_TABLET | Freq: Every day | ORAL | Status: DC

## 2011-08-05 NOTE — Telephone Encounter (Signed)
Rx sent to pharmacy   

## 2011-08-05 NOTE — Telephone Encounter (Signed)
Pt needs new rxs levothyroxine 50 mcg and atorvastatin 80 mg #90 with 3 refills call to [facility] [phone]

## 2011-08-15 ENCOUNTER — Encounter: Payer: Self-pay | Admitting: Cardiology

## 2011-08-15 DIAGNOSIS — I739 Peripheral vascular disease, unspecified: Secondary | ICD-10-CM

## 2011-08-15 DIAGNOSIS — I779 Disorder of arteries and arterioles, unspecified: Secondary | ICD-10-CM | POA: Insufficient documentation

## 2011-08-15 DIAGNOSIS — IMO0002 Reserved for concepts with insufficient information to code with codable children: Secondary | ICD-10-CM | POA: Insufficient documentation

## 2011-08-15 DIAGNOSIS — E785 Hyperlipidemia, unspecified: Secondary | ICD-10-CM | POA: Insufficient documentation

## 2011-08-15 DIAGNOSIS — I2510 Atherosclerotic heart disease of native coronary artery without angina pectoris: Secondary | ICD-10-CM | POA: Insufficient documentation

## 2011-08-15 DIAGNOSIS — E039 Hypothyroidism, unspecified: Secondary | ICD-10-CM | POA: Insufficient documentation

## 2011-08-15 DIAGNOSIS — R9430 Abnormal result of cardiovascular function study, unspecified: Secondary | ICD-10-CM | POA: Insufficient documentation

## 2011-08-18 ENCOUNTER — Ambulatory Visit (INDEPENDENT_AMBULATORY_CARE_PROVIDER_SITE_OTHER): Payer: Medicare Other | Admitting: Cardiology

## 2011-08-18 ENCOUNTER — Encounter: Payer: Self-pay | Admitting: Cardiology

## 2011-08-18 VITALS — BP 184/82 | HR 97 | Ht 63.0 in | Wt 116.0 lb

## 2011-08-18 DIAGNOSIS — E785 Hyperlipidemia, unspecified: Secondary | ICD-10-CM

## 2011-08-18 DIAGNOSIS — R030 Elevated blood-pressure reading, without diagnosis of hypertension: Secondary | ICD-10-CM

## 2011-08-18 DIAGNOSIS — I779 Disorder of arteries and arterioles, unspecified: Secondary | ICD-10-CM

## 2011-08-18 DIAGNOSIS — I2510 Atherosclerotic heart disease of native coronary artery without angina pectoris: Secondary | ICD-10-CM

## 2011-08-18 NOTE — Assessment & Plan Note (Signed)
The patient is doing well. She had a residual 70% lesion in the remote past. Her last nuclear scan exercise test was 2007. Therefore it is appropriate to proceed with followup exercise testing at this time. This will be arranged.

## 2011-08-18 NOTE — Assessment & Plan Note (Signed)
The patient has white coat hypertension. She checks her blood pressure at home on a regular basis. It is always normal. Her home blood pressure cuff has been correlated to the office cuff. She does not need any treatment.

## 2011-08-18 NOTE — Patient Instructions (Signed)
Your physician wants you to follow-up in:  12 months. You will receive a reminder letter in the mail two months in advance. If you don't receive a letter, please call our office to schedule the follow-up appointment.  Your physician has requested that you have a lexiscan myoview. For further information please visit [email or web address]. Please follow instruction sheet, as given.    

## 2011-08-18 NOTE — Progress Notes (Signed)
HPI Patient is seen today to followup coronary disease. I saw her last in December, 2011. She is doing well. She's not had any chest pain or shortness of breath. She underwent PCI to the right coronary artery and diagonal in the remote past. She had a residual 70% LAD lesion. Her last evaluation with exercise testing was in 2007 with a nuclear study. This revealed no ischemia. She has significant carotid artery disease also. Her Doppler's were followed very carefully.  As part of today's evaluation I have carefully reviewed all of her cardiology past medical history and completely updated the new electronic medical record No Known Allergies  Current Outpatient Prescriptions  Medication Sig Dispense Refill  . Ascorbic Acid (VITAMIN C) 100 MG tablet Take 100 mg by mouth daily.        Marland Kitchen aspirin 81 MG tablet Take 81 mg by mouth daily.        Marland Kitchen atorvastatin (LIPITOR) 80 MG tablet Take 80 mg by mouth daily.       . fish oil-omega-3 fatty acids 1000 MG capsule Take 2 g by mouth daily.        Marland Kitchen levothyroxine (SYNTHROID, LEVOTHROID) 50 MCG tablet Take 1 tablet (50 mcg total) by mouth daily.  90 tablet  3  . MULTIPLE VITAMIN PO Take by mouth.          History   Social History  . Marital Status: Married    Spouse Name: N/A    Number of Children: N/A  . Years of Education: N/A   Occupational History  . Not on file.   Social History Main Topics  . Smoking status: Former Smoker    Quit date: 07/07/1958  . Smokeless tobacco: Not on file  . Alcohol Use: Yes     1 per week  . Drug Use: No  . Sexually Active: Not on file   Other Topics Concern  . Not on file   Social History Narrative   HH  Of 2  No pets Family from [place] Exercise   Walks every day at least 30 minutes  2 miles .Sleep  Ok 10 - 7     Family History  Problem Relation Age of Onset  . Heart attack Father   . Osteoporosis      Past Medical History  Diagnosis Date  . CAD (coronary artery disease)     PCI to RCA and  diagonal in remote past, residual 70% LAD  /   nuclear, 2007, no ischemia  . Dyslipidemia     Significant drop in LDL from Lipitor even though LDL remains high  . Hypothyroidism     Thyroid surgery in the past, thyroid nodules followed by Dr.Ellison  . Carotid arterial disease     Doppler, June, 2011, stable, 60-79% R. ICA, 40-59% LICA  . HTN (hypertension)     white coat  . Prominent abdominal aortic pulsation     No abdominal aneurysm by ultrasound  . Ejection fraction     EF 65%, nuclear, 2007    Past Surgical History  Procedure Date  . Thyroid cyst excision   . Coronary angioplasty with stent placement     ROS Patient denies fever, chills, headache, sweats, rash, change in vision, change in hearing, chest pain, cough, nausea vomiting, urinary symptoms. All other systems are reviewed and are negative.  PHYSICAL EXAM Patient is stable. She is oriented to person time and place. Affect is normal. There is no xanthelasma. There is no jugular  venous distention. Lungs are clear. Respiratory effort is nonlabored. Cardiac exam reveals S1 and S2. There are no clicks or significant murmurs. The abdomen is soft. There is no peripheral edema. There no musculoskeletal deformities. There are no skin rashes. Filed Vitals:   08/18/11 0903  BP: 184/82  Pulse: 97  Height: 5\' 3"  (1.6 m)  Weight: 116 lb (52.617 kg)   EKG is done today and reviewed by me. She has normal sinus rhythm. There are very mild nonspecific ST-T wave changes. There is no significant change.  ASSESSMENT & PLAN

## 2011-08-18 NOTE — Assessment & Plan Note (Signed)
The patient has very careful followup of her significant carotid disease. This is stable.

## 2011-08-18 NOTE — Assessment & Plan Note (Signed)
The patient's lipids are well treated. Her LDL has remained high over time but she had a very significant drop with statin therapy. This percent reduction in her LDL is significant. No change in therapy.

## 2011-08-27 ENCOUNTER — Ambulatory Visit (HOSPITAL_COMMUNITY): Payer: Medicare Other | Attending: Cardiovascular Disease | Admitting: Radiology

## 2011-08-27 VITALS — BP 164/73 | Ht 63.0 in | Wt 111.0 lb

## 2011-08-27 DIAGNOSIS — I4949 Other premature depolarization: Secondary | ICD-10-CM

## 2011-08-27 DIAGNOSIS — E785 Hyperlipidemia, unspecified: Secondary | ICD-10-CM | POA: Insufficient documentation

## 2011-08-27 DIAGNOSIS — I739 Peripheral vascular disease, unspecified: Secondary | ICD-10-CM | POA: Insufficient documentation

## 2011-08-27 DIAGNOSIS — R002 Palpitations: Secondary | ICD-10-CM | POA: Insufficient documentation

## 2011-08-27 DIAGNOSIS — I2510 Atherosclerotic heart disease of native coronary artery without angina pectoris: Secondary | ICD-10-CM

## 2011-08-27 DIAGNOSIS — Z87891 Personal history of nicotine dependence: Secondary | ICD-10-CM | POA: Insufficient documentation

## 2011-08-27 DIAGNOSIS — I779 Disorder of arteries and arterioles, unspecified: Secondary | ICD-10-CM | POA: Insufficient documentation

## 2011-08-27 DIAGNOSIS — Z8249 Family history of ischemic heart disease and other diseases of the circulatory system: Secondary | ICD-10-CM | POA: Insufficient documentation

## 2011-08-27 DIAGNOSIS — Z9861 Coronary angioplasty status: Secondary | ICD-10-CM | POA: Insufficient documentation

## 2011-08-27 MED ORDER — TECHNETIUM TC 99M TETROFOSMIN IV KIT
10.0000 | PACK | Freq: Once | INTRAVENOUS | Status: AC | PRN
  Administered 2011-08-27: 10 via INTRAVENOUS

## 2011-08-27 MED ORDER — TECHNETIUM TC 99M TETROFOSMIN IV KIT
30.0000 | PACK | Freq: Once | INTRAVENOUS | Status: AC | PRN
  Administered 2011-08-27: 30 via INTRAVENOUS

## 2011-08-27 NOTE — Progress Notes (Signed)
[facility] SITE 3 NUCLEAR MED [address] [place] [address] [phone]  Cardiology Nuclear Med Study  Natasha Harper is a 77 y.o. female [id number] 02/19/1935   Nuclear Med Background Indication for Stress Test:  Evaluation for Ischemia and PTCA Patency History: '93 MI-'93 Angioplasty: RCA,DX, Heart Cath: residual 70% LAD PTCA to RCA, '07 MPS: EF: (-) ischemia Cardiac Risk Factors: Carotid Disease, Family History - CAD, History of Smoking, Lipids and PVD  Symptoms:  Palpitations   Nuclear Pre-Procedure Caffeine/Decaff Intake:  None NPO After: 7:30pm   Lungs:  clear IV 0.9% NS with Angio Cath:  20g  IV Site: R Antecubital  IV Started by:  Stanton Kidney, EMT-P  Chest Size (in):  34 Cup Size: C  Height: 5\' 3"  (1.6 m)  Weight:  111 lb (50.349 kg)  BMI:  Body mass index is 19.66 kg/(m^2). Tech Comments:  NA    Nuclear Med Study 1 or 2 day study: 1 day  Stress Test Type:  Stress  Reading MD: Charlton Haws, MD  Order Authorizing Provider:  J.Katz  Resting Radionuclide: Technetium 78m Tetrofosmin  Resting Radionuclide Dose: 11.0 mCi   Stress Radionuclide:  Technetium 46m Tetrofosmin  Stress Radionuclide Dose: 33.0 mCi           Stress Protocol Rest HR: 75 Stress HR: 136  Rest BP: 164/73 Stress BP: 212/66  Exercise Time (min): 6:45 METS: 8.10   Predicted Max HR: 146 bpm % Max HR: 93.15 bpm Rate Pressure Product: 28413   Dose of Adenosine (mg):  n/a Dose of Lexiscan: n/a mg  Dose of Atropine (mg): n/a Dose of Dobutamine: n/a mcg/kg/min (at max HR)  Stress Test Technologist: Milana Na, EMT-P  Nuclear Technologist:  Doyne Keel, CNMT     Rest Procedure:  Myocardial perfusion imaging was performed at rest 45 minutes following the intravenous administration of Technetium 81m Tetrofosmin. Rest ECG: NSR  Stress Procedure:  The patient exercised for 6:45.  The patient stopped due to fatigue and denied any chest pain.  There were no significant  ST-T wave changes and rare pvcs.  Technetium 6m Tetrofosmin was injected at peak exercise and myocardial perfusion imaging was performed after a brief delay. Stress ECG: No significant change from baseline ECG  QPS Raw Data Images:  Normal; no motion artifact; normal heart/lung ratio. Stress Images:  Normal homogeneous uptake in all areas of the myocardium. Rest Images:  Normal homogeneous uptake in all areas of the myocardium. Subtraction (SDS):  Normal Transient Ischemic Dilatation (Normal <1.22):  1.01 Lung/Heart Ratio (Normal <0.45):  0.33  Quantitative Gated Spect Images QGS EDV:  61 ml QGS ESV:  20 ml QGS cine images:  NL LV Function; NL Wall Motion QGS EF: 67%  Impression Exercise Capacity:  Fair exercise capacity. BP Response:  Hypertensive blood pressure response. Clinical Symptoms:  No chest pain. ECG Impression:  No significant ST segment change suggestive of ischemia. Comparison with Prior Nuclear Study: No images to compare  Overall Impression:  Normal stress nuclear study.   Charlton Haws

## 2011-09-10 ENCOUNTER — Telehealth: Payer: Self-pay | Admitting: Internal Medicine

## 2011-09-10 MED ORDER — ATORVASTATIN CALCIUM 80 MG PO TABS: 80.0000 mg | ORAL_TABLET | Freq: Every day | ORAL | Status: DC

## 2011-09-10 NOTE — Telephone Encounter (Signed)
Rx sent to [facility] x 1 rf.  Pt states she thinks she has enough to last until the rx comes in the mail.  Pt aware to call back if pt needs rx called in locally.

## 2011-09-10 NOTE — Telephone Encounter (Signed)
Patient called stating she need a refill on her atorvastatin and [facility] her pharmacy states they never received an rx from her MD. Patient states she only has 6 pills left.  Please assist.

## 2011-12-30 ENCOUNTER — Other Ambulatory Visit: Payer: Self-pay | Admitting: *Deleted

## 2011-12-30 DIAGNOSIS — I6529 Occlusion and stenosis of unspecified carotid artery: Secondary | ICD-10-CM

## 2012-01-06 ENCOUNTER — Encounter (INDEPENDENT_AMBULATORY_CARE_PROVIDER_SITE_OTHER): Payer: Medicare Other

## 2012-01-06 DIAGNOSIS — I6529 Occlusion and stenosis of unspecified carotid artery: Secondary | ICD-10-CM

## 2012-01-13 ENCOUNTER — Encounter: Payer: Self-pay | Admitting: Cardiology

## 2012-01-21 ENCOUNTER — Encounter: Payer: Self-pay | Admitting: Cardiology

## 2012-01-21 NOTE — Progress Notes (Signed)
   The patient had a followup carotid Doppler that was quite stable. Since it has been stable over time she does not need a six-month followup. We will arrange for a one-year followup.

## 2012-03-11 ENCOUNTER — Other Ambulatory Visit: Payer: Self-pay | Admitting: Family Medicine

## 2012-03-11 MED ORDER — ATORVASTATIN CALCIUM 80 MG PO TABS: 80.0000 mg | ORAL_TABLET | Freq: Every day | ORAL | Status: DC

## 2012-06-08 ENCOUNTER — Other Ambulatory Visit: Payer: Self-pay | Admitting: Internal Medicine

## 2012-06-08 MED ORDER — ATORVASTATIN CALCIUM 80 MG PO TABS: 80.0000 mg | ORAL_TABLET | Freq: Every day | ORAL | Status: DC

## 2012-06-09 ENCOUNTER — Ambulatory Visit (INDEPENDENT_AMBULATORY_CARE_PROVIDER_SITE_OTHER): Payer: Medicare Other | Admitting: Family Medicine

## 2012-06-09 DIAGNOSIS — Z23 Encounter for immunization: Secondary | ICD-10-CM

## 2012-06-21 ENCOUNTER — Other Ambulatory Visit: Payer: Self-pay | Admitting: Obstetrics & Gynecology

## 2012-06-21 DIAGNOSIS — Z1231 Encounter for screening mammogram for malignant neoplasm of breast: Secondary | ICD-10-CM

## 2012-07-08 ENCOUNTER — Ambulatory Visit (INDEPENDENT_AMBULATORY_CARE_PROVIDER_SITE_OTHER): Payer: Medicare Other | Admitting: Internal Medicine

## 2012-07-08 ENCOUNTER — Encounter: Payer: Self-pay | Admitting: Internal Medicine

## 2012-07-08 VITALS — BP 166/80 | HR 97 | Temp 98.0°F | Wt 115.0 lb

## 2012-07-08 DIAGNOSIS — E785 Hyperlipidemia, unspecified: Secondary | ICD-10-CM

## 2012-07-08 DIAGNOSIS — M899 Disorder of bone, unspecified: Secondary | ICD-10-CM

## 2012-07-08 DIAGNOSIS — E039 Hypothyroidism, unspecified: Secondary | ICD-10-CM

## 2012-07-08 DIAGNOSIS — R030 Elevated blood-pressure reading, without diagnosis of hypertension: Secondary | ICD-10-CM

## 2012-07-08 DIAGNOSIS — I2510 Atherosclerotic heart disease of native coronary artery without angina pectoris: Secondary | ICD-10-CM

## 2012-07-08 LAB — BASIC METABOLIC PANEL
BUN: 14 mg/dL (ref 6–23)
Creatinine, Ser: 0.6 mg/dL (ref 0.4–1.2)
GFR: 107.65 mL/min (ref >60.00)
Glucose, Bld: 101 mg/dL — ABNORMAL HIGH (ref 70–99)

## 2012-07-08 LAB — CBC WITH DIFFERENTIAL/PLATELET
Basophils Relative: 0.2 % (ref 0.0–3.0)
HCT: 39.4 % (ref 36.0–46.0)
Hemoglobin: 13.3 g/dL (ref 12.0–15.0)
Lymphocytes Relative: 20.6 % (ref 12.0–46.0)
Lymphs Abs: 1.1 10*3/uL (ref 0.7–4.0)
MCHC: 33.8 g/dL (ref 30.0–36.0)
Monocytes Relative: 5.5 % (ref 3.0–12.0)
Neutro Abs: 3.7 10*3/uL (ref 1.4–7.7)
RBC: 3.97 Mil/uL (ref 3.87–5.11)

## 2012-07-08 LAB — LIPID PANEL: Cholesterol: 179 mg/dL (ref 0–200)

## 2012-07-08 LAB — TSH: TSH: 0.77 u[IU]/mL (ref 0.35–5.50)

## 2012-07-08 LAB — HEPATIC FUNCTION PANEL
ALT: 28 U/L (ref 0–35)
AST: 29 U/L (ref 0–37)
Bilirubin, Direct: 0.1 mg/dL (ref 0.0–0.3)
Total Bilirubin: 0.8 mg/dL (ref 0.3–1.2)

## 2012-07-08 NOTE — Progress Notes (Signed)
Chief Complaint  Patient presents with  . Follow-up    HPI: Patient comes in today for follow up of  multiple medical problems.  Last visit  Was over a year ago.   Bp readings at home  119-123 range .   She has known white coat hypertension see note from Dr. Myrtis Ser blood pressure readings at home are usually in the 120 range.  She exercises walks on a regular basis no symptoms Dexa scan done by Dr. Hyacinth Meeker showed some declrease    Drinks penty of milk   Vit d ok. We'll not take medicine if abnormal so test was not repeated CAD CAROTID   Had neg stress nuclear study in feb 13  Fu carotid dopplers   Thyroid doing well on the brand Synthroid.  ROS: See pertinent positives and negatives per HPI. No unusual changes hearing vision bleeding. No significant depression no history of falling. Rest as per  hpi .   Past Medical History  Diagnosis Date  . CAD (coronary artery disease)     PCI to RCA and diagonal in remote past, residual 70% LAD  /   nuclear, 2007, no ischemia  . Dyslipidemia     Significant drop in LDL from Lipitor even though LDL remains high  . Hypothyroidism     Thyroid surgery in the past, thyroid nodules followed by Dr.Ellison  . Carotid arterial disease     Doppler, June, 2011, stable, 60-79% R. ICA, 40-59% LICA  . HTN (hypertension)     white coat  . Prominent abdominal aortic pulsation     No abdominal aneurysm by ultrasound  . Ejection fraction     EF 65%, nuclear, 2007    Family History  Problem Relation Age of Onset  . Heart attack Father   . Osteoporosis      History   Social History  . Marital Status: Married    Spouse Name: N/A    Number of Children: N/A  . Years of Education: N/A   Social History Main Topics  . Smoking status: Former Smoker    Quit date: 07/07/1958  . Smokeless tobacco: None  . Alcohol Use: Yes     Comment: 1 per week  . Drug Use: No  . Sexually Active: None   Other Topics Concern  . None   Social History Narrative   HH  Of  2  No pets Family from [place] Exercise   Walks every day at least 30 minutes  2 miles .Sleep  Ok 10 - 7     Outpatient Encounter Prescriptions as of 07/08/2012  Medication Sig Dispense Refill  . Ascorbic Acid (VITAMIN C) 100 MG tablet Take 100 mg by mouth daily.        Marland Kitchen aspirin 81 MG tablet Take 81 mg by mouth daily.        Marland Kitchen atorvastatin (LIPITOR) 80 MG tablet Take 1 tablet (80 mg total) by mouth daily.  90 tablet  0  . fish oil-omega-3 fatty acids 1000 MG capsule Take 2 g by mouth daily.        Marland Kitchen levothyroxine (SYNTHROID, LEVOTHROID) 50 MCG tablet Take 1 tablet (50 mcg total) by mouth daily.  90 tablet  3  . MULTIPLE VITAMIN PO Take by mouth.          EXAM:  BP 166/80  Pulse 97  Temp 98 F (36.7 C) (Oral)  Wt 115 lb (52.164 kg)  SpO2 96%  There is no height on file  to calculate BMI.  GENERAL: vitals reviewed and listed above, alert, oriented, appears well hydrated and in no acute distress  HEENT: atraumatic, conjunctiva  clear, no obvious abnormalities on inspection of external nose and ears OP : no lesion edema or exudate   NECK: no obvious masses on inspection palpation no adenopathy Abdomen:  Sof,t normal bowel sounds without hepatosplenomegaly, no guarding rebound or masses no CVA tenderness  LUNGS: clear to auscultation bilaterally, no wheezes, rales or rhonchi, good air movement  CV: HRRR, no clubbing cyanosis or  peripheral edema nl cap refill   MS: moves all extremities without noticeable focal  abnormality  PSYCH: pleasant and cooperative, no obvious depression or anxiety Lab Results  Component Value Date   WBC 4.8 06/02/2011   HGB 13.4 06/02/2011   HCT 39.5 06/02/2011   PLT 235.0 06/02/2011   GLUCOSE 103* 06/02/2011   CHOL 186 06/02/2011   TRIG 55.0 06/02/2011   HDL 51.70 06/02/2011   LDLDIRECT 152.1 01/16/2010   LDLCALC 123* 06/02/2011   ALT 26 06/02/2011   AST 29 06/02/2011   NA 140 06/02/2011   K 4.5 06/02/2011   CL 104 06/02/2011   CREATININE  0.7 06/02/2011   BUN 14 06/02/2011   CO2 26 06/02/2011   TSH 0.60 06/02/2011   HGBA1C 5.6 08/23/2008    ASSESSMENT AND PLAN:  Discussed the following assessment and plan:  1. Hypothyroidism  Basic metabolic panel, Lipid panel, TSH, Hepatic function panel, CBC with Differential   on brand med had problem with generic swallowing   2. HYPERTENSION, WHITE COAT  Basic metabolic panel, Lipid panel, TSH, Hepatic function panel, CBC with Differential   documented   3. OSTEOPENIA  Basic metabolic panel, Lipid panel, TSH, Hepatic function panel, CBC with Differential   counseled  doesnt want medication  4. CAD (coronary artery disease)  Basic metabolic panel, Lipid panel, TSH, Hepatic function panel, CBC with Differential   stable monitoring  nuclear stress 2013 neg   5. Dyslipidemia  Basic metabolic panel, Lipid panel, TSH, Hepatic function panel, CBC with Differential   on high dose lipitor without known se  labs  to  mointor for today     -Patient advised to return or notify health care team  immediately if symptoms worsen or persist or new concerns arise.  Patient Instructions  Continue lifestyle intervention healthy eating and exercise . Weight bearing exercise . Healthy eating . Will notify you  of labs when available.  If ok then  Yearly visit .      Neta Mends. Krithi Bray M.D.

## 2012-07-08 NOTE — Addendum Note (Signed)
Addended by: Raj Janus T on: 07/08/2012 12:12 PM   Modules accepted: Orders

## 2012-07-08 NOTE — Patient Instructions (Signed)
Continue lifestyle intervention healthy eating and exercise . Weight bearing exercise . Healthy eating . Will notify you  of labs when available.  If ok then  Yearly visit .

## 2012-07-09 ENCOUNTER — Ambulatory Visit (HOSPITAL_COMMUNITY)
Admission: RE | Admit: 2012-07-09 | Discharge: 2012-07-09 | Disposition: A | Discharge status: Home / Self Care | Payer: Medicare Other | Source: Ambulatory Visit | Attending: Obstetrics & Gynecology | Admitting: Obstetrics & Gynecology

## 2012-07-09 DIAGNOSIS — Z1231 Encounter for screening mammogram for malignant neoplasm of breast: Secondary | ICD-10-CM | POA: Insufficient documentation

## 2012-07-14 ENCOUNTER — Other Ambulatory Visit: Payer: Self-pay | Admitting: Obstetrics & Gynecology

## 2012-07-14 DIAGNOSIS — R928 Other abnormal and inconclusive findings on diagnostic imaging of breast: Secondary | ICD-10-CM

## 2012-07-14 NOTE — Progress Notes (Signed)
Quick Note:  Called and spoke with pt and pt is aware. ______ 

## 2012-07-22 ENCOUNTER — Other Ambulatory Visit: Payer: Self-pay | Admitting: Obstetrics & Gynecology

## 2012-07-22 ENCOUNTER — Ambulatory Visit
Admission: RE | Admit: 2012-07-22 | Discharge: 2012-07-22 | Disposition: A | Discharge status: Home / Self Care | Payer: Medicare Other | Source: Ambulatory Visit | Attending: Obstetrics & Gynecology | Admitting: Obstetrics & Gynecology

## 2012-07-22 DIAGNOSIS — R928 Other abnormal and inconclusive findings on diagnostic imaging of breast: Secondary | ICD-10-CM

## 2012-08-04 ENCOUNTER — Ambulatory Visit
Admission: RE | Admit: 2012-08-04 | Discharge: 2012-08-04 | Disposition: A | Discharge status: Home / Self Care | Payer: Medicare Other | Source: Ambulatory Visit | Attending: Obstetrics & Gynecology | Admitting: Obstetrics & Gynecology

## 2012-08-04 ENCOUNTER — Other Ambulatory Visit: Payer: Self-pay | Admitting: Obstetrics & Gynecology

## 2012-08-04 ENCOUNTER — Other Ambulatory Visit (HOSPITAL_COMMUNITY)
Admission: RE | Admit: 2012-08-04 | Discharge: 2012-08-04 | Disposition: A | Discharge status: Home / Self Care | Payer: Medicare Other | Source: Ambulatory Visit | Attending: Obstetrics & Gynecology | Admitting: Obstetrics & Gynecology

## 2012-08-04 DIAGNOSIS — R928 Other abnormal and inconclusive findings on diagnostic imaging of breast: Secondary | ICD-10-CM

## 2012-08-04 DIAGNOSIS — N6009 Solitary cyst of unspecified breast: Secondary | ICD-10-CM | POA: Insufficient documentation

## 2012-08-20 ENCOUNTER — Encounter: Payer: Self-pay | Admitting: Cardiology

## 2012-08-23 ENCOUNTER — Encounter: Payer: Self-pay | Admitting: Cardiology

## 2012-08-23 ENCOUNTER — Ambulatory Visit (INDEPENDENT_AMBULATORY_CARE_PROVIDER_SITE_OTHER): Payer: Medicare Other | Admitting: Cardiology

## 2012-08-23 VITALS — BP 154/86 | HR 90 | Ht 63.0 in | Wt 115.0 lb

## 2012-08-23 DIAGNOSIS — I779 Disorder of arteries and arterioles, unspecified: Secondary | ICD-10-CM

## 2012-08-23 DIAGNOSIS — E785 Hyperlipidemia, unspecified: Secondary | ICD-10-CM

## 2012-08-23 DIAGNOSIS — I2510 Atherosclerotic heart disease of native coronary artery without angina pectoris: Secondary | ICD-10-CM

## 2012-08-23 DIAGNOSIS — R030 Elevated blood-pressure reading, without diagnosis of hypertension: Secondary | ICD-10-CM

## 2012-08-23 NOTE — Assessment & Plan Note (Signed)
She has mild increase in her systolic pressure. She has white coat hypertension. We know that her pressures are normal at home. No change in therapy.

## 2012-08-23 NOTE — Patient Instructions (Addendum)
Your physician has requested that you have a carotid duplex in July, 2014. This test is an ultrasound of the carotid arteries in your neck. It looks at blood flow through these arteries that supply the brain with blood. Allow one hour for this exam. There are no restrictions or special instructions.  Your physician wants you to follow-up in: 1 year.   You will receive a reminder letter in the mail two months in advance. If you don't receive a letter, please call our office to schedule the follow-up appointment.

## 2012-08-23 NOTE — Assessment & Plan Note (Signed)
Her current dose of Lipitor is the appropriate dose of medicine for her.

## 2012-08-23 NOTE — Assessment & Plan Note (Signed)
Coronary disease is stable. Nuclear stress study February, 2013 revealed no scar or ischemia and a normal ejection fraction. No further workup needed

## 2012-08-23 NOTE — Assessment & Plan Note (Signed)
She will be scheduled to have a followup Doppler July, 2014.

## 2012-08-23 NOTE — Progress Notes (Signed)
Patient ID: Natasha Harper, female   DOB: [birth year], 78 y.o.   MRN: [id number]   HPI    The patient is seen for cardiology followup. I have followed her for many years. Fortunately she is remains stable. We decided last year to proceed with a nuclear stress study as it had been more than 5 years. The study showed normal LV function and no evidence of significant ischemia. She is on high-dose Lipitor. We know that her LDL remains above range. However I initially she had substantial decrease in LDL when Lipitor was added. The current guidelines Outlined that her current dosing of Lipitor is the drug that she should receive.  No Known Allergies  Current Outpatient Prescriptions  Medication Sig Dispense Refill  . Ascorbic Acid (VITAMIN C) 100 MG tablet Take 100 mg by mouth daily.        Marland Kitchen aspirin 81 MG tablet Take 81 mg by mouth daily.        Marland Kitchen atorvastatin (LIPITOR) 80 MG tablet Take 1 tablet (80 mg total) by mouth daily.  90 tablet  0  . fish oil-omega-3 fatty acids 1000 MG capsule Take 2 g by mouth daily.        Marland Kitchen levothyroxine (SYNTHROID, LEVOTHROID) 50 MCG tablet Take 50 mcg by mouth daily. Brand name only      . MULTIPLE VITAMIN PO Take by mouth.         No current facility-administered medications for this visit.    History   Social History  . Marital Status: Married    Spouse Name: N/A    Number of Children: N/A  . Years of Education: N/A   Occupational History  . Not on file.   Social History Main Topics  . Smoking status: Former Smoker    Quit date: 07/07/1958  . Smokeless tobacco: Not on file  . Alcohol Use: Yes     Comment: 1 per week  . Drug Use: No  . Sexually Active: Not on file   Other Topics Concern  . Not on file   Social History Narrative   HH     Of 2  No pets    Family from [place]    Exercise   Walks every day at least 30 minutes  2 miles .   Sleep  Ok 10 - 7     Family History  Problem Relation Age of Onset  . Heart attack Father   .  Osteoporosis      Past Medical History  Diagnosis Date  . CAD (coronary artery disease)     PCI to RCA and diagonal in remote past, residual 70% LAD  /   nuclear, 2007, no ischemia  . Dyslipidemia     Significant drop in LDL from Lipitor even though LDL remains high  . Hypothyroidism     Thyroid surgery in the past, thyroid nodules followed by Dr.Ellison  . Carotid arterial disease     Doppler, June, 2011, stable, 60-79% R. ICA, 40-59% LICA  . HTN (hypertension)     white coat  . Prominent abdominal aortic pulsation     No abdominal aneurysm by ultrasound  . Ejection fraction     EF 65%, nuclear, 2007    Past Surgical History  Procedure Laterality Date  . Thyroid cyst excision    . Coronary angioplasty with stent placement      Patient Active Problem List  Diagnosis  . HYPOTHYROIDISM  . LUMP OR MASS IN BREAST  .  OSTEOPENIA  . HYPERGLYCEMIA, FASTING  . HYPERTENSION, WHITE COAT  . Visit for preventive health examination  . Medication side effect  . CAD (coronary artery disease)  . Dyslipidemia  . Carotid arterial disease  . Hypothyroidism  . Ejection fraction    ROS    Patient denies fever, chills, headache, sweats, rash, change in vision, change in hearing, chest pain, cough, nausea vomiting, urinary symptoms. All other systems are reviewed and are negative.  PHYSICAL EXAM   Patient is oriented to person time and place. Affect is normal. There is no jugulovenous distention. She does have carotid bruits. Lungs are clear. Respiratory effort is nonlabored. Cardiac exam reveals S1 and S2. There no clicks or significant murmurs. The abdomen is soft. There's no peripheral edema.  Filed Vitals:   08/23/12 0907  BP: 154/86  Pulse: 90  Height: 5\' 3"  (1.6 m)  Weight: 115 lb (52.164 kg)   EKG is done today and reviewed by me. There is no significant abnormality. There is no significant change from the past.  ASSESSMENT & PLAN

## 2012-09-13 ENCOUNTER — Other Ambulatory Visit: Payer: Self-pay | Admitting: Family Medicine

## 2012-09-13 MED ORDER — ATORVASTATIN CALCIUM 80 MG PO TABS: 80.0000 mg | ORAL_TABLET | Freq: Every day | ORAL | Status: DC

## 2012-09-27 ENCOUNTER — Other Ambulatory Visit: Payer: Self-pay | Admitting: Internal Medicine

## 2012-09-27 MED ORDER — LEVOTHYROXINE SODIUM 50 MCG PO TABS: 50.0000 ug | ORAL_TABLET | Freq: Every day | ORAL | Status: DC

## 2012-12-20 ENCOUNTER — Other Ambulatory Visit: Payer: Self-pay | Admitting: Internal Medicine

## 2012-12-20 DIAGNOSIS — N6001 Solitary cyst of right breast: Secondary | ICD-10-CM

## 2012-12-24 ENCOUNTER — Ambulatory Visit
Admission: RE | Admit: 2012-12-24 | Discharge: 2012-12-24 | Disposition: A | Discharge status: Home / Self Care | Payer: Medicare Other | Source: Ambulatory Visit | Attending: Internal Medicine | Admitting: Internal Medicine

## 2012-12-24 DIAGNOSIS — N6001 Solitary cyst of right breast: Secondary | ICD-10-CM

## 2013-01-06 ENCOUNTER — Encounter (INDEPENDENT_AMBULATORY_CARE_PROVIDER_SITE_OTHER): Payer: Self-pay | Admitting: Surgery

## 2013-01-06 ENCOUNTER — Ambulatory Visit (INDEPENDENT_AMBULATORY_CARE_PROVIDER_SITE_OTHER): Payer: Medicare Other | Admitting: Surgery

## 2013-01-06 VITALS — BP 164/82 | HR 78 | Resp 18 | Ht 63.0 in | Wt 113.0 lb

## 2013-01-06 DIAGNOSIS — N63 Unspecified lump in unspecified breast: Secondary | ICD-10-CM

## 2013-01-06 NOTE — Patient Instructions (Signed)
We will schedule surgery to remove the cyst in the right breast.

## 2013-01-06 NOTE — Progress Notes (Signed)
Patient ID: Natasha Harper, female   DOB: [birth year], 78 y.o.   MRN: [id number]  Chief Complaint  Patient presents with  . Breast Mass    Eval rt br mass    HPI Natasha Harper is a 78 y.o. female.  She was referred for evaluation of a complex cyst in the right breast. She has had multiple cysts in her breast over many years. However, one of them appears to have an intracystic component. Excisional biopsy was recommended. The other cysts have been stable. She has had prior cyst aspirations many years ago. She is not having any rest symptoms or problems. She otherwise considers herself in good health.  HPI  Past Medical History  Diagnosis Date  . CAD (coronary artery disease)     PCI to RCA and diagonal in remote past, residual 70% LAD  /   nuclear, 2007, no ischemia  . Dyslipidemia     Significant drop in LDL from Lipitor even though LDL remains high  . Hypothyroidism     Thyroid surgery in the past, thyroid nodules followed by Dr.Ellison  . Carotid arterial disease     Doppler, June, 2011, stable, 60-79% R. ICA, 40-59% LICA  . HTN (hypertension)     white coat  . Prominent abdominal aortic pulsation     No abdominal aneurysm by ultrasound  . Ejection fraction     EF 65%, nuclear, 2007    Past Surgical History  Procedure Laterality Date  . Thyroid cyst excision    . Coronary angioplasty with stent placement      Family History  Problem Relation Age of Onset  . Heart attack Father   . Osteoporosis      Social History History  Substance Use Topics  . Smoking status: Former Smoker    Quit date: 07/07/1958  . Smokeless tobacco: Not on file  . Alcohol Use: Yes     Comment: 1 per week    No Known Allergies  Current Outpatient Prescriptions  Medication Sig Dispense Refill  . Ascorbic Acid (VITAMIN C) 100 MG tablet Take 100 mg by mouth daily.        Marland Kitchen aspirin 81 MG tablet Take 81 mg by mouth daily.        Marland Kitchen atorvastatin (LIPITOR) 80 MG tablet Take 1 tablet (80 mg  total) by mouth daily.  90 tablet  2  . fish oil-omega-3 fatty acids 1000 MG capsule Take 2 g by mouth daily.        Marland Kitchen levothyroxine (SYNTHROID, LEVOTHROID) 50 MCG tablet Take 1 tablet (50 mcg total) by mouth daily. Brand name only  90 tablet  1  . MULTIPLE VITAMIN PO Take by mouth.         No current facility-administered medications for this visit.    Review of Systems Review of Systems  Constitutional: Negative for fever, chills and unexpected weight change.  HENT: Negative for hearing loss, congestion, sore throat, trouble swallowing and voice change.   Eyes: Negative for visual disturbance.  Respiratory: Negative for cough and wheezing.   Cardiovascular: Negative for chest pain, palpitations and leg swelling.  Gastrointestinal: Negative for nausea, vomiting, abdominal pain, diarrhea, constipation, blood in stool, abdominal distention and anal bleeding.  Genitourinary: Negative for hematuria, vaginal bleeding and difficulty urinating.  Musculoskeletal: Negative for arthralgias.  Skin: Negative for rash and wound.  Neurological: Negative for seizures, syncope and headaches.  Hematological: Negative for adenopathy. Does not bruise/bleed easily.  Psychiatric/Behavioral: Negative for confusion.  Blood pressure 164/82, pulse 78, resp. rate 18, height 5\' 3"  (1.6 m), weight 113 lb (51.256 kg).  Physical Exam Physical Exam  Vitals reviewed. Constitutional: She is oriented to person, place, and time. She appears well-developed and well-nourished. No distress.  HENT:  Head: Normocephalic and atraumatic.  Mouth/Throat: Oropharynx is clear and moist.  Eyes: Conjunctivae and EOM are normal. Pupils are equal, round, and reactive to light. No scleral icterus.  Neck: Normal range of motion. Neck supple. No tracheal deviation present. No thyromegaly present.  Cardiovascular: Normal rate, regular rhythm, normal heart sounds and intact distal pulses.  Exam reveals no gallop and no friction rub.    No murmur heard. Pulmonary/Chest: Effort normal and breath sounds normal. No respiratory distress. She has no wheezes. She has no rales.    Multiple right breast cysts, all well circumscribed. The one in the lower outer quadrant is the one in question.  Abdominal: Soft. Bowel sounds are normal. She exhibits no distension and no mass. There is no tenderness. There is no rebound and no guarding.  Musculoskeletal: Normal range of motion. She exhibits no edema and no tenderness.  Lymphadenopathy:    She has no cervical adenopathy.    She has no axillary adenopathy.       Right: No supraclavicular adenopathy present.       Left: No supraclavicular adenopathy present.  Neurological: She is alert and oriented to person, place, and time.  Skin: Skin is warm and dry. No rash noted. She is not diaphoretic. No erythema.  Psychiatric: She has a normal mood and affect. Her behavior is normal. Judgment and thought content normal.    Data Reviewed I reviewed the mammogram ultrasound and pathology reports as well as the notes in the electronic medical record  Assessment    Complex cyst right breast lower-outer quadrant, uncertain clinical significance     Plan    I recommended excision of this area. This can be done as an outpatient with IV sedation or general anesthesia. I told her I think this is likely benign but could harbor an intracystic carcinoma., Risks and complications of surgery. I think all questions are answered. We'll set this up to be done at her convenience.        [name] 01/06/2013, 3:48 PM

## 2013-01-10 ENCOUNTER — Encounter (HOSPITAL_BASED_OUTPATIENT_CLINIC_OR_DEPARTMENT_OTHER): Payer: Self-pay | Admitting: *Deleted

## 2013-01-10 NOTE — Progress Notes (Signed)
No labs needed-sees dr Verlin Grills ekg-2/14

## 2013-01-12 ENCOUNTER — Encounter (HOSPITAL_BASED_OUTPATIENT_CLINIC_OR_DEPARTMENT_OTHER)
Admission: RE | Disposition: A | Discharge status: Home / Self Care | Payer: Self-pay | Source: Ambulatory Visit | Attending: Surgery

## 2013-01-12 ENCOUNTER — Encounter (HOSPITAL_BASED_OUTPATIENT_CLINIC_OR_DEPARTMENT_OTHER): Payer: Self-pay | Admitting: Anesthesiology

## 2013-01-12 ENCOUNTER — Ambulatory Visit (HOSPITAL_BASED_OUTPATIENT_CLINIC_OR_DEPARTMENT_OTHER)
Admission: RE | Admit: 2013-01-12 | Discharge: 2013-01-12 | Disposition: A | Discharge status: Home / Self Care | Payer: Medicare Other | Source: Ambulatory Visit | Attending: Surgery | Admitting: Surgery

## 2013-01-12 ENCOUNTER — Encounter (HOSPITAL_BASED_OUTPATIENT_CLINIC_OR_DEPARTMENT_OTHER): Payer: Self-pay | Admitting: *Deleted

## 2013-01-12 ENCOUNTER — Ambulatory Visit (HOSPITAL_BASED_OUTPATIENT_CLINIC_OR_DEPARTMENT_OTHER): Payer: Medicare Other | Admitting: Anesthesiology

## 2013-01-12 DIAGNOSIS — N6019 Diffuse cystic mastopathy of unspecified breast: Secondary | ICD-10-CM

## 2013-01-12 DIAGNOSIS — I6529 Occlusion and stenosis of unspecified carotid artery: Secondary | ICD-10-CM | POA: Insufficient documentation

## 2013-01-12 DIAGNOSIS — E785 Hyperlipidemia, unspecified: Secondary | ICD-10-CM | POA: Insufficient documentation

## 2013-01-12 DIAGNOSIS — Z79899 Other long term (current) drug therapy: Secondary | ICD-10-CM | POA: Insufficient documentation

## 2013-01-12 DIAGNOSIS — E039 Hypothyroidism, unspecified: Secondary | ICD-10-CM | POA: Insufficient documentation

## 2013-01-12 DIAGNOSIS — Z9861 Coronary angioplasty status: Secondary | ICD-10-CM | POA: Insufficient documentation

## 2013-01-12 DIAGNOSIS — Z87891 Personal history of nicotine dependence: Secondary | ICD-10-CM | POA: Insufficient documentation

## 2013-01-12 DIAGNOSIS — I2510 Atherosclerotic heart disease of native coronary artery without angina pectoris: Secondary | ICD-10-CM | POA: Insufficient documentation

## 2013-01-12 DIAGNOSIS — N63 Unspecified lump in unspecified breast: Secondary | ICD-10-CM

## 2013-01-12 DIAGNOSIS — I10 Essential (primary) hypertension: Secondary | ICD-10-CM | POA: Insufficient documentation

## 2013-01-12 DIAGNOSIS — I658 Occlusion and stenosis of other precerebral arteries: Secondary | ICD-10-CM | POA: Insufficient documentation

## 2013-01-12 DIAGNOSIS — Z7982 Long term (current) use of aspirin: Secondary | ICD-10-CM | POA: Insufficient documentation

## 2013-01-12 HISTORY — DX: Encounter for fitting and adjustment of spectacles and contact lenses: Z46.0

## 2013-01-12 HISTORY — PX: BREAST BIOPSY: SHX20

## 2013-01-12 SURGERY — BREAST BIOPSY
Anesthesia: Monitor Anesthesia Care | Site: Breast | Laterality: Right | Wound class: Clean

## 2013-01-12 MED ORDER — OXYCODONE HCL 5 MG/5ML PO SOLN: 5.0000 mg | Freq: Once | ORAL | Status: DC | PRN

## 2013-01-12 MED ORDER — HYDROCODONE-ACETAMINOPHEN 5-325 MG PO TABS: 1.0000 | ORAL_TABLET | ORAL | Status: DC | PRN

## 2013-01-12 MED ORDER — MIDAZOLAM HCL 5 MG/5ML IJ SOLN
INTRAMUSCULAR | Status: DC | PRN
  Administered 2013-01-12: 2 mg via INTRAVENOUS

## 2013-01-12 MED ORDER — MEPERIDINE HCL 25 MG/ML IJ SOLN: 6.2500 mg | INTRAMUSCULAR | Status: DC | PRN

## 2013-01-12 MED ORDER — FENTANYL CITRATE 0.05 MG/ML IJ SOLN: 50.0000 ug | INTRAMUSCULAR | Status: DC | PRN

## 2013-01-12 MED ORDER — HYDROMORPHONE HCL PF 1 MG/ML IJ SOLN: 0.2500 mg | INTRAMUSCULAR | Status: DC | PRN

## 2013-01-12 MED ORDER — LIDOCAINE HCL (CARDIAC) 20 MG/ML IV SOLN
INTRAVENOUS | Status: DC | PRN
  Administered 2013-01-12: 50 mg via INTRAVENOUS

## 2013-01-12 MED ORDER — FENTANYL CITRATE 0.05 MG/ML IJ SOLN
INTRAMUSCULAR | Status: DC | PRN
  Administered 2013-01-12: 100 ug via INTRAVENOUS

## 2013-01-12 MED ORDER — ONDANSETRON HCL 4 MG/2ML IJ SOLN: 4.0000 mg | Freq: Once | INTRAMUSCULAR | Status: DC | PRN

## 2013-01-12 MED ORDER — MIDAZOLAM HCL 2 MG/2ML IJ SOLN: 1.0000 mg | INTRAMUSCULAR | Status: DC | PRN

## 2013-01-12 MED ORDER — CEFAZOLIN SODIUM-DEXTROSE 2-3 GM-% IV SOLR
2.0000 g | INTRAVENOUS | Status: AC
  Administered 2013-01-12: 2 g via INTRAVENOUS

## 2013-01-12 MED ORDER — CHLORHEXIDINE GLUCONATE 4 % EX LIQD: 1.0000 "application " | Freq: Once | CUTANEOUS | Status: DC

## 2013-01-12 MED ORDER — LACTATED RINGERS IV SOLN
INTRAVENOUS | Status: DC
  Administered 2013-01-12: 08:00:00 via INTRAVENOUS

## 2013-01-12 MED ORDER — LIDOCAINE-EPINEPHRINE (PF) 1 %-1:200000 IJ SOLN
INTRAMUSCULAR | Status: DC | PRN
  Administered 2013-01-12: 10:00:00 via INTRAMUSCULAR

## 2013-01-12 MED ORDER — OXYCODONE HCL 5 MG PO TABS: 5.0000 mg | ORAL_TABLET | Freq: Once | ORAL | Status: DC | PRN

## 2013-01-12 MED ORDER — PROPOFOL 10 MG/ML IV BOLUS
INTRAVENOUS | Status: DC | PRN
  Administered 2013-01-12 (×4): 20 mg via INTRAVENOUS

## 2013-01-12 MED ORDER — ONDANSETRON HCL 4 MG/2ML IJ SOLN
INTRAMUSCULAR | Status: DC | PRN
  Administered 2013-01-12: 4 mg via INTRAVENOUS

## 2013-01-12 SURGICAL SUPPLY — 50 items
ADH SKN CLS APL DERMABOND .7 (GAUZE/BANDAGES/DRESSINGS) ×1
APPLICATOR COTTON TIP 6IN STRL (MISCELLANEOUS) IMPLANT
BINDER BREAST LRG (GAUZE/BANDAGES/DRESSINGS) IMPLANT
BINDER BREAST MEDIUM (GAUZE/BANDAGES/DRESSINGS) IMPLANT
BINDER BREAST XLRG (GAUZE/BANDAGES/DRESSINGS) IMPLANT
BINDER BREAST XXLRG (GAUZE/BANDAGES/DRESSINGS) IMPLANT
BLADE HEX COATED 2.75 (ELECTRODE) ×2 IMPLANT
BLADE SURG 15 STRL LF DISP TIS (BLADE) ×1 IMPLANT
BLADE SURG 15 STRL SS (BLADE) ×2
CANISTER SUCTION 1200CC (MISCELLANEOUS) ×2 IMPLANT
CHLORAPREP W/TINT 26ML (MISCELLANEOUS) ×2 IMPLANT
CLIP TI MEDIUM 6 (CLIP) IMPLANT
CLIP TI WIDE RED SMALL 6 (CLIP) IMPLANT
CLOTH BEACON ORANGE TIMEOUT ST (SAFETY) ×2 IMPLANT
COVER MAYO STAND STRL (DRAPES) ×2 IMPLANT
COVER TABLE BACK 60X90 (DRAPES) ×2 IMPLANT
DECANTER SPIKE VIAL GLASS SM (MISCELLANEOUS) IMPLANT
DERMABOND ADVANCED (GAUZE/BANDAGES/DRESSINGS) ×1
DERMABOND ADVANCED .7 DNX12 (GAUZE/BANDAGES/DRESSINGS) ×1 IMPLANT
DEVICE DUBIN W/COMP PLATE 8390 (MISCELLANEOUS) IMPLANT
DRAPE LAPAROTOMY TRNSV 102X78 (DRAPE) ×2 IMPLANT
DRAPE SURG 17X23 STRL (DRAPES) IMPLANT
DRAPE UTILITY XL STRL (DRAPES) ×2 IMPLANT
ELECT REM PT RETURN 9FT ADLT (ELECTROSURGICAL) ×2
ELECTRODE REM PT RTRN 9FT ADLT (ELECTROSURGICAL) ×1 IMPLANT
GLOVE BIO SURGEON STRL SZ 6 (GLOVE) ×1 IMPLANT
GLOVE BIOGEL PI IND STRL 6.5 (GLOVE) IMPLANT
GLOVE BIOGEL PI INDICATOR 6.5 (GLOVE) ×1
GLOVE EUDERMIC 7 POWDERFREE (GLOVE) ×2 IMPLANT
GLOVE SURG SS PI 7.0 STRL IVOR (GLOVE) ×1 IMPLANT
GOWN PREVENTION PLUS XLARGE (GOWN DISPOSABLE) ×5 IMPLANT
KIT MARKER MARGIN INK (KITS) IMPLANT
NDL HYPO 25X1 1.5 SAFETY (NEEDLE) ×1 IMPLANT
NEEDLE HYPO 25X1 1.5 SAFETY (NEEDLE) ×2 IMPLANT
NS IRRIG 1000ML POUR BTL (IV SOLUTION) IMPLANT
PACK BASIN DAY SURGERY FS (CUSTOM PROCEDURE TRAY) ×2 IMPLANT
PENCIL BUTTON HOLSTER BLD 10FT (ELECTRODE) ×2 IMPLANT
SHEET MEDIUM DRAPE 40X70 STRL (DRAPES) ×1 IMPLANT
SLEEVE SCD COMPRESS KNEE MED (MISCELLANEOUS) ×2 IMPLANT
SPONGE GAUZE 4X4 12PLY (GAUZE/BANDAGES/DRESSINGS) IMPLANT
SPONGE INTESTINAL PEANUT (DISPOSABLE) IMPLANT
SPONGE LAP 4X18 X RAY DECT (DISPOSABLE) ×2 IMPLANT
STAPLER VISISTAT 35W (STAPLE) IMPLANT
SUT MNCRL AB 4-0 PS2 18 (SUTURE) ×2 IMPLANT
SUT SILK 0 TIES 10X30 (SUTURE) IMPLANT
SUT VICRYL 3-0 CR8 SH (SUTURE) ×2 IMPLANT
SYR CONTROL 10ML LL (SYRINGE) ×2 IMPLANT
TOWEL OR NON WOVEN STRL DISP B (DISPOSABLE) ×2 IMPLANT
TUBE CONNECTING 20X1/4 (TUBING) ×2 IMPLANT
YANKAUER SUCT BULB TIP NO VENT (SUCTIONS) ×2 IMPLANT

## 2013-01-12 NOTE — Op Note (Signed)
Natasha Harper [facility] 1935/02/05 [id number] 01/06/2013  Preoperative diagnosis: complex right breast cyst, pathology inconclusive from needle core biopsy  Postoperative diagnosis: same  Procedure: excision complex right breast cyst  Surgeon: Currie Paris, MD, FACS  Anesthesia: MAC combined with regional for post-op pain   Clinical History and Indications: this patient has a complex right breast cyst with a question of an intraluminal mass. The needle core biopsy was inconclusive and excisional biopsy recommended. The mass was readily palpable.    Description of Procedure: the patient was seen in the preoperative area, the plans confirmed, and the right breast marked as the operative side. She was taken to the operating room and the mass was identified and marked prior to the administration of IV sedation.  After satisfactory IV sedation was obtained the right breast was prepped and draped in a time out was performed. A combination of 1% Xylocaine with epinephrine mixed equally with 0.5% Marcaine was used for local. I infiltrated the skin overlying the mass and around the mass was a subcutaneous tissues being fully anesthetized. I then made a curvilinear incision over the mass. The mass was excised using cautery. It came out intact and in toto. It appeared to be a blue domed cyst. Specimen mammogram showed the clip in the specimen.  Additional local was infiltrated to help with postop pain control. The incision was closed in layers with 3-0 Vicryl, 4-0 Monocryl subcuticular, plus Dermabond.  The patient tolerated the procedure well. There were no complications. Counts were correct. Blood loss minimal.  Currie Paris, MD, FACS 01/12/2013 10:12 AM

## 2013-01-12 NOTE — Anesthesia Preprocedure Evaluation (Addendum)
Anesthesia Evaluation  Patient identified by MRN, date of birth, ID band Patient awake    Reviewed: Allergy & Precautions, H&P , NPO status , Patient's Chart, lab work & pertinent test results  Airway Mallampati: I TM Distance: >3 FB Neck ROM: Full    Dental   Pulmonary          Cardiovascular hypertension, Pt. on medications + CAD     Neuro/Psych    GI/Hepatic   Endo/Other  Hypothyroidism   Renal/GU      Musculoskeletal   Abdominal   Peds  Hematology   Anesthesia Other Findings   Reproductive/Obstetrics                           Anesthesia Physical Anesthesia Plan  ASA: II  Anesthesia Plan: MAC   Post-op Pain Management:    Induction: Intravenous  Airway Management Planned: Simple Face Mask  Additional Equipment:   Intra-op Plan:   Post-operative Plan: Extubation in OR  Informed Consent: I have reviewed the patients History and Physical, chart, labs and discussed the procedure including the risks, benefits and alternatives for the proposed anesthesia with the patient or authorized representative who has indicated his/her understanding and acceptance.     Plan Discussed with: CRNA and Surgeon  Anesthesia Plan Comments:        Anesthesia Quick Evaluation

## 2013-01-12 NOTE — Anesthesia Postprocedure Evaluation (Signed)
Anesthesia Post Note  Patient: Natasha Harper  Procedure(s) Performed: Procedure(s) (LRB): Removal of right breast mass (Right)  Anesthesia type: general  Patient location: PACU  Post pain: Pain level controlled  Post assessment: Patient's Cardiovascular Status Stable  Last Vitals:  Filed Vitals:   01/12/13 1100  BP: 153/57  Pulse: 71  Temp: 36.8 C  Resp: 16    Post vital signs: Reviewed and stable  Level of consciousness: sedated  Complications: No apparent anesthesia complications

## 2013-01-12 NOTE — Transfer of Care (Signed)
Immediate Anesthesia Transfer of Care Note  Patient: Natasha Harper  Procedure(s) Performed: Procedure(s): Removal of right breast mass (Right)  Patient Location: PACU  Anesthesia Type:MAC  Level of Consciousness: awake, alert  and oriented  Airway & Oxygen Therapy: Patient Spontanous Breathing  Post-op Assessment: Report given to PACU RN and Post -op Vital signs reviewed and stable  Post vital signs: Reviewed and stable  Complications: No apparent anesthesia complications

## 2013-01-12 NOTE — Interval H&P Note (Signed)
History and Physical Interval Note:  01/12/2013 9:20 AM  Natasha Harper  has presented today for surgery, with the diagnosis of right breast mass  The various methods of treatment have been discussed with the patient and family. After consideration of risks, benefits and other options for treatment, the patient has consented to  Procedure(s): Removal of right breast mass (Right) as a surgical intervention .  The patient's history has been reviewed, patient examined, no change in status, stable for surgery.  I have reviewed the patient's chart and labs.  Questions were answered to the patient's satisfaction.   The right breast is marked as the operative side  Shondra Capps J

## 2013-01-12 NOTE — H&P (View-Only) (Signed)
Patient ID: Natasha Harper, female   DOB: 05/26/1935, 77 y.o.   MRN: [id number]  Chief Complaint  Patient presents with  . Breast Mass    Eval rt br mass    HPI Natasha Harper is a 77 y.o. female.  She was referred for evaluation of a complex cyst in the right breast. She has had multiple cysts in her breast over many years. However, one of them appears to have an intracystic component. Excisional biopsy was recommended. The other cysts have been stable. She has had prior cyst aspirations many years ago. She is not having any rest symptoms or problems. She otherwise considers herself in good health.  HPI  Past Medical History  Diagnosis Date  . CAD (coronary artery disease)     PCI to RCA and diagonal in remote past, residual 70% LAD  /   nuclear, 2007, no ischemia  . Dyslipidemia     Significant drop in LDL from Lipitor even though LDL remains high  . Hypothyroidism     Thyroid surgery in the past, thyroid nodules followed by Dr.Ellison  . Carotid arterial disease     Doppler, June, 2011, stable, 60-79% R. ICA, 40-59% LICA  . HTN (hypertension)     white coat  . Prominent abdominal aortic pulsation     No abdominal aneurysm by ultrasound  . Ejection fraction     EF 65%, nuclear, 2007    Past Surgical History  Procedure Laterality Date  . Thyroid cyst excision    . Coronary angioplasty with stent placement      Family History  Problem Relation Age of Onset  . Heart attack Father   . Osteoporosis      Social History History  Substance Use Topics  . Smoking status: Former Smoker    Quit date: 07/07/1958  . Smokeless tobacco: Not on file  . Alcohol Use: Yes     Comment: 1 per week    No Known Allergies  Current Outpatient Prescriptions  Medication Sig Dispense Refill  . Ascorbic Acid (VITAMIN C) 100 MG tablet Take 100 mg by mouth daily.        . aspirin 81 MG tablet Take 81 mg by mouth daily.        . atorvastatin (LIPITOR) 80 MG tablet Take 1 tablet (80 mg  total) by mouth daily.  90 tablet  2  . fish oil-omega-3 fatty acids 1000 MG capsule Take 2 g by mouth daily.        . levothyroxine (SYNTHROID, LEVOTHROID) 50 MCG tablet Take 1 tablet (50 mcg total) by mouth daily. Brand name only  90 tablet  1  . MULTIPLE VITAMIN PO Take by mouth.         No current facility-administered medications for this visit.    Review of Systems Review of Systems  Constitutional: Negative for fever, chills and unexpected weight change.  HENT: Negative for hearing loss, congestion, sore throat, trouble swallowing and voice change.   Eyes: Negative for visual disturbance.  Respiratory: Negative for cough and wheezing.   Cardiovascular: Negative for chest pain, palpitations and leg swelling.  Gastrointestinal: Negative for nausea, vomiting, abdominal pain, diarrhea, constipation, blood in stool, abdominal distention and anal bleeding.  Genitourinary: Negative for hematuria, vaginal bleeding and difficulty urinating.  Musculoskeletal: Negative for arthralgias.  Skin: Negative for rash and wound.  Neurological: Negative for seizures, syncope and headaches.  Hematological: Negative for adenopathy. Does not bruise/bleed easily.  Psychiatric/Behavioral: Negative for confusion.      Blood pressure 164/82, pulse 78, resp. rate 18, height 5' 3" (1.6 m), weight 113 lb (51.256 kg).  Physical Exam Physical Exam  Vitals reviewed. Constitutional: She is oriented to person, place, and time. She appears well-developed and well-nourished. No distress.  HENT:  Head: Normocephalic and atraumatic.  Mouth/Throat: Oropharynx is clear and moist.  Eyes: Conjunctivae and EOM are normal. Pupils are equal, round, and reactive to light. No scleral icterus.  Neck: Normal range of motion. Neck supple. No tracheal deviation present. No thyromegaly present.  Cardiovascular: Normal rate, regular rhythm, normal heart sounds and intact distal pulses.  Exam reveals no gallop and no friction rub.    No murmur heard. Pulmonary/Chest: Effort normal and breath sounds normal. No respiratory distress. She has no wheezes. She has no rales.    Multiple right breast cysts, all well circumscribed. The one in the lower outer quadrant is the one in question.  Abdominal: Soft. Bowel sounds are normal. She exhibits no distension and no mass. There is no tenderness. There is no rebound and no guarding.  Musculoskeletal: Normal range of motion. She exhibits no edema and no tenderness.  Lymphadenopathy:    She has no cervical adenopathy.    She has no axillary adenopathy.       Right: No supraclavicular adenopathy present.       Left: No supraclavicular adenopathy present.  Neurological: She is alert and oriented to person, place, and time.  Skin: Skin is warm and dry. No rash noted. She is not diaphoretic. No erythema.  Psychiatric: She has a normal mood and affect. Her behavior is normal. Judgment and thought content normal.    Data Reviewed I reviewed the mammogram ultrasound and pathology reports as well as the notes in the electronic medical record  Assessment    Complex cyst right breast lower-outer quadrant, uncertain clinical significance     Plan    I recommended excision of this area. This can be done as an outpatient with IV sedation or general anesthesia. I told her I think this is likely benign but could harbor an intracystic carcinoma., Risks and complications of surgery. I think all questions are answered. We'll set this up to be done at her convenience.        Rahmon Heigl J 01/06/2013, 3:48 PM    

## 2013-01-13 ENCOUNTER — Encounter (HOSPITAL_BASED_OUTPATIENT_CLINIC_OR_DEPARTMENT_OTHER): Payer: Self-pay | Admitting: Surgery

## 2013-01-13 ENCOUNTER — Encounter (INDEPENDENT_AMBULATORY_CARE_PROVIDER_SITE_OTHER): Payer: Self-pay | Admitting: Surgery

## 2013-01-13 ENCOUNTER — Telehealth (INDEPENDENT_AMBULATORY_CARE_PROVIDER_SITE_OTHER): Payer: Self-pay

## 2013-01-13 NOTE — Telephone Encounter (Signed)
Spoke with pt, giving her her path results - benign

## 2013-02-09 ENCOUNTER — Ambulatory Visit (INDEPENDENT_AMBULATORY_CARE_PROVIDER_SITE_OTHER): Payer: Medicare Other | Admitting: Surgery

## 2013-02-09 ENCOUNTER — Encounter (INDEPENDENT_AMBULATORY_CARE_PROVIDER_SITE_OTHER): Payer: Self-pay | Admitting: Surgery

## 2013-02-09 VITALS — BP 166/88 | HR 74 | Temp 97.3°F | Resp 14 | Ht 63.0 in | Wt 115.0 lb

## 2013-02-09 DIAGNOSIS — Z09 Encounter for follow-up examination after completed treatment for conditions other than malignant neoplasm: Secondary | ICD-10-CM

## 2013-02-09 DIAGNOSIS — N63 Unspecified lump in unspecified breast: Secondary | ICD-10-CM

## 2013-02-09 NOTE — Patient Instructions (Signed)
We will see you again on an as needed basis. Please call the office at [phone] if you have any questions or concerns. Thank you for allowing us to take care of you.  

## 2013-02-09 NOTE — Progress Notes (Signed)
NAMEMARIELYS Harper [facility]                                            DOB: 1935-03-23 DATE: 02/09/2013                                                  MRN: [id number]  CC:  Chief Complaint  Patient presents with  . Follow-up    HPI: This patient comes in for post op follow-up .Sheunderwent removal of a right breast mass on 01/12/13. She feels that she is doing well.  PE:  VITAL SIGNS: BP 166/88  Pulse 74  Temp(Src) 97.3 F (36.3 C) (Temporal)  Resp 14  Ht 5\' 3"  (1.6 m)  Wt 115 lb (52.164 kg)  BMI 20.38 kg/m2  General: The patient appears to be healthy, NAD Incision: Healing nicely  DATA REVIEWED: PATH: Diagnosis Breast, lumpectomy, Right - BENIGN CYST, 2.6 CM. - FIBROCYSTIC CHANGES. - THERE IS NO EVIDENCE OF MALIGNANCY. - SEE COMMENT. Microscopic Comment Grossly, there is a 2.6 cm cystic lesion. Histologic evaluation reveals a benign cyst lined by a simple layer of cuboidal cells. Within the lumen of the cyst is debris and cholesterol clefts, suggestive of previous procedure. Malignant features are not identified. The surgical resection margin(s) of the specimen were inked and microscopically evaluated. (JBK:ecj 01/13/2013) Pecola Leisure MD Pathologist, Electronic Signature (Case signed 01/13/2013)  IMPRESSION: The patient is doing well S/P removal of right breast mass.    PLAN: RTC PRN. I gave the patient a copy of the pathology report and reviewed it with her

## 2013-03-04 ENCOUNTER — Other Ambulatory Visit: Payer: Self-pay | Admitting: Family Medicine

## 2013-03-04 MED ORDER — LEVOTHYROXINE SODIUM 50 MCG PO TABS: 50.0000 ug | ORAL_TABLET | Freq: Every day | ORAL | Status: DC

## 2013-03-07 HISTORY — PX: BREAST EXCISIONAL BIOPSY: SUR124

## 2013-03-08 ENCOUNTER — Encounter (INDEPENDENT_AMBULATORY_CARE_PROVIDER_SITE_OTHER): Payer: Medicare Other

## 2013-03-08 DIAGNOSIS — I6529 Occlusion and stenosis of unspecified carotid artery: Secondary | ICD-10-CM

## 2013-03-08 DIAGNOSIS — I779 Disorder of arteries and arterioles, unspecified: Secondary | ICD-10-CM

## 2013-03-10 ENCOUNTER — Encounter: Payer: Self-pay | Admitting: Cardiology

## 2013-03-11 ENCOUNTER — Other Ambulatory Visit: Payer: Self-pay | Admitting: Family Medicine

## 2013-05-12 ENCOUNTER — Other Ambulatory Visit: Payer: Self-pay

## 2013-05-19 ENCOUNTER — Ambulatory Visit (INDEPENDENT_AMBULATORY_CARE_PROVIDER_SITE_OTHER): Payer: Medicare Other

## 2013-05-19 DIAGNOSIS — Z23 Encounter for immunization: Secondary | ICD-10-CM

## 2013-05-30 ENCOUNTER — Other Ambulatory Visit: Payer: Self-pay | Admitting: Family Medicine

## 2013-05-30 MED ORDER — ATORVASTATIN CALCIUM 80 MG PO TABS: 80.0000 mg | ORAL_TABLET | Freq: Every day | ORAL | Status: DC

## 2013-07-21 ENCOUNTER — Telehealth: Payer: Self-pay | Admitting: Internal Medicine

## 2013-07-21 NOTE — Telephone Encounter (Signed)
[facility] requesting a new script for levothyroxine (SYNTHROID, LEVOTHROID) 50 MCG tablet

## 2013-07-22 MED ORDER — LEVOTHYROXINE SODIUM 50 MCG PO TABS: 50.0000 ug | ORAL_TABLET | Freq: Every day | ORAL | Status: DC

## 2013-07-22 NOTE — Telephone Encounter (Signed)
I sent in a 90 day supply.  The patient is past due for her yearly wellness exam.  Please try to have this scheduled within the 90 days of her prescription.  Thanks!

## 2013-09-07 ENCOUNTER — Telehealth: Payer: Self-pay | Admitting: Internal Medicine

## 2013-09-07 MED ORDER — ATORVASTATIN CALCIUM 80 MG PO TABS: 80.0000 mg | ORAL_TABLET | Freq: Every day | ORAL | Status: DC

## 2013-09-07 NOTE — Telephone Encounter (Signed)
[facility] - [place], [place] - [address] requesting refill of atorvastatin (LIPITOR) 80 MG tablet

## 2013-09-07 NOTE — Telephone Encounter (Signed)
Seen the pt made an appt for April.  Sent in one 90 day refill to the pharmacy.

## 2013-09-21 ENCOUNTER — Ambulatory Visit (INDEPENDENT_AMBULATORY_CARE_PROVIDER_SITE_OTHER): Payer: Medicare Other | Admitting: Cardiology

## 2013-09-21 ENCOUNTER — Encounter: Payer: Self-pay | Admitting: Cardiology

## 2013-09-21 VITALS — BP 170/98 | HR 100 | Ht 63.0 in | Wt 121.0 lb

## 2013-09-21 DIAGNOSIS — I739 Peripheral vascular disease, unspecified: Secondary | ICD-10-CM

## 2013-09-21 DIAGNOSIS — R030 Elevated blood-pressure reading, without diagnosis of hypertension: Secondary | ICD-10-CM

## 2013-09-21 DIAGNOSIS — I2510 Atherosclerotic heart disease of native coronary artery without angina pectoris: Secondary | ICD-10-CM

## 2013-09-21 DIAGNOSIS — I779 Disorder of arteries and arterioles, unspecified: Secondary | ICD-10-CM

## 2013-09-21 NOTE — Progress Notes (Signed)
HPI  The patient is seen today for followup coronary disease. I saw her last February, 2014. Her coronary disease is stable. She is undergone nuclear stress testing in 2013. There was no ischemia. She has good LV function. She has been on 80 mg of atorvastatin for many years. She had a breast mass removed during the past year. It was benign. She's doing well.  No Known Allergies  Current Outpatient Prescriptions  Medication Sig Dispense Refill  . Ascorbic Acid (VITAMIN C) 100 MG tablet Take 100 mg by mouth daily.        Marland Kitchen. aspirin 81 MG tablet Take 81 mg by mouth daily.        Marland Kitchen. atorvastatin (LIPITOR) 80 MG tablet Take 1 tablet (80 mg total) by mouth daily.  90 tablet  0  . fish oil-omega-3 fatty acids 1000 MG capsule Take 2 g by mouth daily.        Marland Kitchen. levothyroxine (SYNTHROID, LEVOTHROID) 50 MCG tablet Take 1 tablet (50 mcg total) by mouth daily. Brand name only  90 tablet  0  . MULTIPLE VITAMIN PO Take by mouth.         No current facility-administered medications for this visit.    History   Social History  . Marital Status: Married    Spouse Name: N/A    Number of Children: N/A  . Years of Education: N/A   Occupational History  . Not on file.   Social History Main Topics  . Smoking status: Former Smoker    Quit date: 07/07/1958  . Smokeless tobacco: Not on file  . Alcohol Use: Yes     Comment: 1 per week  . Drug Use: No  . Sexual Activity: Not on file   Other Topics Concern  . Not on file   Social History Narrative   HH     Of 2  No pets    Family from [place]    Exercise   Walks every day at least 30 minutes  2 miles .   Sleep  Ok 10 - 7     Family History  Problem Relation Age of Onset  . Heart attack Father   . Osteoporosis      Past Medical History  Diagnosis Date  . Dyslipidemia     Significant drop in LDL from Lipitor even though LDL remains high  . Hypothyroidism     Thyroid surgery in the past, thyroid nodules followed by Dr.Ellison  .  Carotid arterial disease     Doppler, June, 2011, stable, 60-79% R. ICA, 40-59% LICA  . Prominent abdominal aortic pulsation     No abdominal aneurysm by ultrasound  . Ejection fraction     EF 65%, nuclear, 2007  . Contact lens/glasses fitting     wears contacts or glasses  . CAD (coronary artery disease)     PCI to RCA and diagonal in remote past, residual 70% LAD  /   nuclear, 2007, no ischemia  . HTN (hypertension)      no med    Past Surgical History  Procedure Laterality Date  . Thyroid cyst excision    . Coronary angioplasty  1993  . Rectoperitoneal fistula closure    . Breast biopsy Right 01/12/2013    Procedure: Removal of right breast mass;  Surgeon: Currie Parishristian J Streck, MD;  Location: [facility];  Service: General;  Laterality: Right;    Patient Active Problem List   Diagnosis Date Noted  .  CAD (coronary artery disease)   . Dyslipidemia   . Carotid arterial disease   . Hypothyroidism   . Ejection fraction   . Visit for preventive health examination 06/02/2011  . Medication side effect 06/02/2011  . OSTEOPENIA 04/07/2008  . HYPERTENSION, WHITE COAT 04/07/2008  . HYPERGLYCEMIA, FASTING 07/21/2007  . HYPOTHYROIDISM 04/05/2007    ROS   Patient denies fever, chills, headache, sweats, rash, change in vision, change in hearing, chest pain, cough, nausea vomiting, urinary symptoms. All other systems are reviewed and are negative.  PHYSICAL EXAM  Patient is oriented to person time and place. Affect is normal. There is no jugulovenous distention. Lungs are clear. Respiratory effort is not labored. Cardiac exam reveals S1 and S2. There no clicks or significant murmurs. The abdomen is soft. There is no peripheral edema.  Filed Vitals:   09/21/13 1124  BP: 170/98  Pulse: 100  Height: 5\' 3"  (1.6 m)  Weight: 121 lb (54.885 kg)   EKG is done today and reviewed by me. There is normal sinus rhythm. An EKG is normal.  ASSESSMENT & PLAN

## 2013-09-21 NOTE — Assessment & Plan Note (Signed)
The patient's cardiac status is stable. Her nuclear scan in 2013 revealed no ischemia. Wall motion is normal. She is treated with aspirin and high-dose atorvastatin. No further workup.  When I see her next year we will decide who will follow her in the future if I am not able to see her.

## 2013-09-21 NOTE — Patient Instructions (Signed)
**Note De-identified Natasha Harper** Your physician recommends that you continue on your current medications as directed. Please refer to the Current Medication list given to you today.  Your physician wants you to follow-up in: 1 year. You will receive a reminder letter in the mail two months in advance. If you don't receive a letter, please call our office to schedule the follow-up appointment.  

## 2013-09-21 NOTE — Assessment & Plan Note (Signed)
In September, 2015. Her last study was stable. She does have significant disease.

## 2013-09-21 NOTE — Assessment & Plan Note (Signed)
As always her blood pressure is high in the office today. We have documented well that she has white coat hypertension. No change in therapy.

## 2013-10-05 ENCOUNTER — Ambulatory Visit (INDEPENDENT_AMBULATORY_CARE_PROVIDER_SITE_OTHER): Payer: Medicare Other | Admitting: Internal Medicine

## 2013-10-05 ENCOUNTER — Encounter: Payer: Self-pay | Admitting: Internal Medicine

## 2013-10-05 VITALS — BP 158/76 | Temp 98.3°F | Ht 63.0 in | Wt 119.0 lb

## 2013-10-05 DIAGNOSIS — R7309 Other abnormal glucose: Secondary | ICD-10-CM

## 2013-10-05 DIAGNOSIS — E785 Hyperlipidemia, unspecified: Secondary | ICD-10-CM

## 2013-10-05 DIAGNOSIS — R030 Elevated blood-pressure reading, without diagnosis of hypertension: Secondary | ICD-10-CM

## 2013-10-05 DIAGNOSIS — I2510 Atherosclerotic heart disease of native coronary artery without angina pectoris: Secondary | ICD-10-CM

## 2013-10-05 DIAGNOSIS — E039 Hypothyroidism, unspecified: Secondary | ICD-10-CM

## 2013-10-05 DIAGNOSIS — Z23 Encounter for immunization: Secondary | ICD-10-CM

## 2013-10-05 DIAGNOSIS — H9190 Unspecified hearing loss, unspecified ear: Secondary | ICD-10-CM

## 2013-10-05 DIAGNOSIS — Z0000 Encounter for general adult medical examination without abnormal findings: Secondary | ICD-10-CM

## 2013-10-05 LAB — BASIC METABOLIC PANEL
BUN: 15 mg/dL (ref 6–23)
CALCIUM: 9.4 mg/dL (ref 8.4–10.5)
CO2: 26 mEq/L (ref 19–32)
Chloride: 103 mEq/L (ref 96–112)
Creatinine, Ser: 0.6 mg/dL (ref 0.4–1.2)
GFR: 99.35 mL/min (ref >60.00)
Glucose, Bld: 98 mg/dL (ref 70–99)
POTASSIUM: 5 meq/L (ref 3.5–5.1)
SODIUM: 136 meq/L (ref 135–145)

## 2013-10-05 LAB — HEPATIC FUNCTION PANEL
ALBUMIN: 4.2 g/dL (ref 3.5–5.2)
ALK PHOS: 53 U/L (ref 39–117)
ALT: 26 U/L (ref 0–35)
AST: 24 U/L (ref 0–37)
BILIRUBIN DIRECT: 0 mg/dL (ref 0.0–0.3)
BILIRUBIN TOTAL: 0.8 mg/dL (ref 0.3–1.2)
Total Protein: 7.1 g/dL (ref 6.0–8.3)

## 2013-10-05 LAB — LIPID PANEL
CHOL/HDL RATIO: 3
Cholesterol: 182 mg/dL (ref 0–200)
HDL: 52.1 mg/dL (ref >39.00)
LDL CALC: 119 mg/dL — AB (ref 0–99)
Triglycerides: 57 mg/dL (ref 0.0–149.0)
VLDL: 11.4 mg/dL (ref 0.0–40.0)

## 2013-10-05 LAB — CBC WITH DIFFERENTIAL/PLATELET
BASOS ABS: 0 10*3/uL (ref 0.0–0.1)
Basophils Relative: 0.3 % (ref 0.0–3.0)
EOS PCT: 0.3 % (ref 0.0–5.0)
Eosinophils Absolute: 0 10*3/uL (ref 0.0–0.7)
HEMATOCRIT: 38.5 % (ref 36.0–46.0)
Hemoglobin: 12.9 g/dL (ref 12.0–15.0)
LYMPHS ABS: 1.1 10*3/uL (ref 0.7–4.0)
LYMPHS PCT: 25.4 % (ref 12.0–46.0)
MCHC: 33.5 g/dL (ref 30.0–36.0)
MCV: 99.8 fl (ref 78.0–100.0)
Monocytes Absolute: 0.3 10*3/uL (ref 0.1–1.0)
Monocytes Relative: 7.8 % (ref 3.0–12.0)
Neutro Abs: 2.8 10*3/uL (ref 1.4–7.7)
Neutrophils Relative %: 66.2 % (ref 43.0–77.0)
PLATELETS: 242 10*3/uL (ref 150.0–400.0)
RBC: 3.85 Mil/uL — ABNORMAL LOW (ref 3.87–5.11)
RDW: 13.8 % (ref 11.5–14.6)
WBC: 4.2 10*3/uL — AB (ref 4.5–10.5)

## 2013-10-05 LAB — HEMOGLOBIN A1C: Hgb A1c MFr Bld: 5.7 % (ref 4.6–6.5)

## 2013-10-05 LAB — TSH: TSH: 0.44 u[IU]/mL (ref 0.35–5.50)

## 2013-10-05 NOTE — Patient Instructions (Signed)
Continue lifestyle intervention healthy eating and exercise . You hearng screen in left ear showed decreased hearing   You could consider getting  Audiologist to check  Your hearing . Will notify you  of labs when available.  Wellness and labs and med check in a year.   Fall Prevention and Home Safety Falls cause injuries and can affect all age groups. It is possible to use preventive measures to significantly decrease the likelihood of falls. There are many simple measures which can make your home safer and prevent falls. OUTDOORS  Repair cracks and edges of walkways and driveways.  Remove high doorway thresholds.  Trim shrubbery on the main path into your home.  Have good outside lighting.  Clear walkways of tools, rocks, debris, and clutter.  Check that handrails are not broken and are securely fastened. Both sides of steps should have handrails.  Have leaves, snow, and ice cleared regularly.  Use sand or salt on walkways during winter months.  In the garage, clean up grease or oil spills. BATHROOM  Install night lights.  Install grab bars by the toilet and in the tub and shower.  Use non-skid mats or decals in the tub or shower.  Place a plastic non-slip stool in the shower to sit on, if needed.  Keep floors dry and clean up all water on the floor immediately.  Remove soap buildup in the tub or shower on a regular basis.  Secure bath mats with non-slip, double-sided rug tape.  Remove throw rugs and tripping hazards from the floors. BEDROOMS  Install night lights.  Make sure a bedside light is easy to reach.  Do not use oversized bedding.  Keep a telephone by your bedside.  Have a firm chair with side arms to use for getting dressed.  Remove throw rugs and tripping hazards from the floor. KITCHEN  Keep handles on pots and pans turned toward the center of the stove. Use back burners when possible.  Clean up spills quickly and allow time for  drying.  Avoid walking on wet floors.  Avoid hot utensils and knives.  Position shelves so they are not too high or low.  Place commonly used objects within easy reach.  If necessary, use a sturdy step stool with a grab bar when reaching.  Keep electrical cables out of the way.  Do not use floor polish or wax that makes floors slippery. If you must use wax, use non-skid floor wax.  Remove throw rugs and tripping hazards from the floor. STAIRWAYS  Never leave objects on stairs.  Place handrails on both sides of stairways and use them. Fix any loose handrails. Make sure handrails on both sides of the stairways are as long as the stairs.  Check carpeting to make sure it is firmly attached along stairs. Make repairs to worn or loose carpet promptly.  Avoid placing throw rugs at the top or bottom of stairways, or properly secure the rug with carpet tape to prevent slippage. Get rid of throw rugs, if possible.  Have an electrician put in a light switch at the top and bottom of the stairs. OTHER FALL PREVENTION TIPS  Wear low-heel or rubber-soled shoes that are supportive and fit well. Wear closed toe shoes.  When using a stepladder, make sure it is fully opened and both spreaders are firmly locked. Do not climb a closed stepladder.  Add color or contrast paint or tape to grab bars and handrails in your home. Place contrasting color strips on  first and last steps.  Learn and use mobility aids as needed. Install an electrical emergency response system.  Turn on lights to avoid dark areas. Replace light bulbs that burn out immediately. Get light switches that glow.  Arrange furniture to create clear pathways. Keep furniture in the same place.  Firmly attach carpet with non-skid or double-sided tape.  Eliminate uneven floor surfaces.  Select a carpet pattern that does not visually hide the edge of steps.  Be aware of all pets. OTHER HOME SAFETY TIPS  Set the water temperature  for 120 F (48.8 C).  Keep emergency numbers on or near the telephone.  Keep smoke detectors on every level of the home and near sleeping areas. Document Released: 06/13/2002 Document Revised: 12/23/2011 Document Reviewed: 09/12/2011 [facility] Patient Information 2014 [place], [place].

## 2013-10-05 NOTE — Progress Notes (Signed)
Chief Complaint  Patient presents with  . Medicare Wellness    HPI: Patient comes in today for Preventive Medicare wellness visit . No major injuries, ed visits  , new medications since last visit. Had breast mass removal benign scar tissue.  On synthroid  50 per day.    Health Maintenance  Topic Date Due  . Influenza Vaccine  02/04/2014  . Tetanus/tdap  07/07/2014  . Colonoscopy  06/01/2021  . Pneumococcal Polysaccharide Vaccine Age 77 And Over  Completed  . Zostavax  Completed   Health Maintenance Review     Hearing: ok  Vision:  No limitations at present . Last eye check UTD  Safety:  Has smoke detector and wears seat belts.  No firearms. No excess sun exposure. Sees dentist regularly.  Falls: no  Advance directive :  Reviewed  hsa hcpoa but not adv directive.   Memory: Felt to be good  , no concern from her or her family.  Depression: No anhedonia unusual crying or depressive symptoms  Nutrition: Eats well balanced diet; adequate calcium and vitamin D. No swallowing chewing problems.  Injury: no major injuries in the last six months.  Other healthcare providers:  Reviewed today .  Social:  Lives with spouse married. No pets.   Preventive parameters: up-to-date  Reviewed   ADLS:   There are no problems or need for assistance  driving, feeding, obtaining food, dressing, toileting and bathing, managing money using phone. She is independent.  EXERCISE/ HABITS  Per week  Walk  Weather permits daily,   No tobacco ,red wine 3 x per week.   Etoh,water no sugar drinks.    ROS:  GEN/ HEENT: No fever, significant weight changes sweats headaches vision problems hearing changes, CV/ PULM; No chest pain shortness of breath cough, syncope,edema  change in exercise tolerance. GI /GU: No adominal pain, vomiting, change in bowel habits. No blood in the stool. No significant GU symptoms. SKIN/HEME: ,no acute skin rashes suspicious lesions or bleeding. No lymphadenopathy,  nodules, masses.  NEURO/ PSYCH:  No neurologic signs such as weakness numbness. No depression anxiety. IMM/ Allergy: No unusual infections.  Allergy .   REST of 12 system review negative except as per HPI   Past Medical History  Diagnosis Date  . Dyslipidemia     Significant drop in LDL from Lipitor even though LDL remains high  . Hypothyroidism     Thyroid surgery in the past, thyroid nodules followed by Dr.Ellison  . Carotid arterial disease     Doppler, June, 2011, stable, 60-79% R. ICA, 40-59% LICA  . Prominent abdominal aortic pulsation     No abdominal aneurysm by ultrasound  . Ejection fraction     EF 65%, nuclear, 2007  . Contact lens/glasses fitting     wears contacts or glasses  . CAD (coronary artery disease)     PCI to RCA and diagonal in remote past, residual 70% LAD  /   nuclear, 2007, no ischemia  . HTN (hypertension)      no med  . Lump or mass in breast 07/19/2007    Excised 01/12/13. B9 on pathQualifier: Diagnosis of  By: Fabian Sharp MD, Neta Mends      Family History  Problem Relation Age of Onset  . Heart attack Father   . Osteoporosis      History   Social History  . Marital Status: Married    Spouse Name: N/A    Number of Children: N/A  . Years  of Education: N/A   Social History Main Topics  . Smoking status: Former Smoker    Quit date: 07/07/1958  . Smokeless tobacco: None  . Alcohol Use: Yes     Comment: 1 per week  . Drug Use: No  . Sexual Activity: None   Other Topics Concern  . None   Social History Narrative   HH     Of 2  No pets    Family from [place]    Exercise   Walks every day at least 30 minutes  2 miles .   Sleep  Ok 10 - 7     Outpatient Encounter Prescriptions as of 10/05/2013  Medication Sig  . Ascorbic Acid (VITAMIN C) 100 MG tablet Take 100 mg by mouth daily.    Marland Kitchen aspirin 81 MG tablet Take 81 mg by mouth daily.    Marland Kitchen atorvastatin (LIPITOR) 80 MG tablet Take 1 tablet (80 mg total) by mouth daily.  . fish oil-omega-3  fatty acids 1000 MG capsule Take 2 g by mouth daily.    Marland Kitchen levothyroxine (SYNTHROID, LEVOTHROID) 50 MCG tablet Take 1 tablet (50 mcg total) by mouth daily. Brand name only  . MULTIPLE VITAMIN PO Take by mouth.      EXAM:  BP 158/76  Temp(Src) 98.3 F (36.8 C) (Oral)  Ht 5\' 3"  (1.6 m)  Wt 119 lb (53.978 kg)  BMI 21.09 kg/m2  Body mass index is 21.09 kg/(m^2).  Physical Exam: Vital signs reviewed [id number] is a well-developed well-nourished alert cooperative   who appears stated age  Or younger in no acute distress.  HEENT: normocephalic atraumatic , Eyes: PERRL EOM's full, conjunctiva clear, Nares: paten,t no deformity discharge or tenderness., Ears: no deformity EAC's clear TMs with normal landmarks. Mouth: clear OP, no lesions, edema.  Moist mucous membranes. Dentition in adequate repair. NECK: supple without masses, thyromegaly or bruits. Breast: normal by inspection . No dimpling, discharge, masses, tenderness or discharge  Well healed scar inferior breast . . CHEST/PULM:  Clear to auscultation and percussion breath sounds equal no wheeze , rales or rhonchi. No chest wall deformities or tenderness. CV: PMI is nondisplaced, S1 S2 no gallops, murmurs, rubs. Peripheral pulses are present No JVD .  ABDOMEN: Bowel sounds normal nontender  No guard or rebound, no hepato splenomegal no CVA tenderness.  No hernia. There is a mid abdominal bruit Extremtities:  No clubbing cyanosis or edema, no acute joint swelling or redness no focal atrophy NEURO:  Oriented x3, cranial nerves 3-12 appear to be intact, no obvious focal weakness,gait within normal limits no abnormal reflexes or asymmetrical SKIN: No acute rashes normal turgor, color, no bruising or petechiae. PSYCH: Oriented, good eye contact, no obvious depression anxiety, cognition and judgment appear normal. LN: no cervical axillary inguinal adenopathy No noted deficits in memory, attention, and speech.   Lab Results  Component Value Date    WBC 4.2* 10/05/2013   HGB 12.9 10/05/2013   HCT 38.5 10/05/2013   PLT 242.0 10/05/2013   GLUCOSE 98 10/05/2013   CHOL 182 10/05/2013   TRIG 57.0 10/05/2013   HDL 52.10 10/05/2013   LDLDIRECT 152.1 01/16/2010   LDLCALC 119* 10/05/2013   ALT 26 10/05/2013   AST 24 10/05/2013   NA 136 10/05/2013   K 5.0 10/05/2013   CL 103 10/05/2013   CREATININE 0.6 10/05/2013   BUN 15 10/05/2013   CO2 26 10/05/2013   TSH 0.44 10/05/2013   HGBA1C 5.7 10/05/2013    ASSESSMENT  AND PLAN:  Discussed the following assessment and plan:  Visit for preventive health examination - declined dexa would not take medication etc  - Plan: Basic metabolic panel, CBC with Differential, Hemoglobin A1c, Hepatic function panel, Lipid panel, TSH  CAD (coronary artery disease)  Dyslipidemia - Plan: Lipid panel  HYPERGLYCEMIA, FASTING - Plan: CBC with Differential, Hemoglobin A1c  HYPOTHYROIDISM - Plan: TSH  HYPERTENSION, WHITE COAT - 120s at home  - Plan: Basic metabolic panel, CBC with Differential, Hepatic function panel  Need for vaccination with 13-polyvalent pneumococcal conjugate vaccine - Plan: Pneumococcal conjugate vaccine 13-valent  Decreased hearing - screen pt aware will consider further eval Declining dexa scan reviewed health care parameters appears well today check labs maintain medicines followup in a year or earlier if needed. Fortunately her vascular disease does not appear to be symptomatic at this time. Patient Care Team: Madelin HeadingsWanda K Panosh, MD as PCP - General Luis AbedJeffrey D Katz, MD (Cardiology) Annamaria BootsMary Suzanne Miller, MD (Gynecology)  Patient Instructions  Continue lifestyle intervention healthy eating and exercise . You hearng screen in left ear showed decreased hearing   You could consider getting  Audiologist to check  Your hearing . Will notify you  of labs when available.  Wellness and labs and med check in a year.   Fall Prevention and Home Safety Falls cause injuries and can affect all age groups. It is possible to  use preventive measures to significantly decrease the likelihood of falls. There are many simple measures which can make your home safer and prevent falls. OUTDOORS  Repair cracks and edges of walkways and driveways.  Remove high doorway thresholds.  Trim shrubbery on the main path into your home.  Have good outside lighting.  Clear walkways of tools, rocks, debris, and clutter.  Check that handrails are not broken and are securely fastened. Both sides of steps should have handrails.  Have leaves, snow, and ice cleared regularly.  Use sand or salt on walkways during winter months.  In the garage, clean up grease or oil spills. BATHROOM  Install night lights.  Install grab bars by the toilet and in the tub and shower.  Use non-skid mats or decals in the tub or shower.  Place a plastic non-slip stool in the shower to sit on, if needed.  Keep floors dry and clean up all water on the floor immediately.  Remove soap buildup in the tub or shower on a regular basis.  Secure bath mats with non-slip, double-sided rug tape.  Remove throw rugs and tripping hazards from the floors. BEDROOMS  Install night lights.  Make sure a bedside light is easy to reach.  Do not use oversized bedding.  Keep a telephone by your bedside.  Have a firm chair with side arms to use for getting dressed.  Remove throw rugs and tripping hazards from the floor. KITCHEN  Keep handles on pots and pans turned toward the center of the stove. Use back burners when possible.  Clean up spills quickly and allow time for drying.  Avoid walking on wet floors.  Avoid hot utensils and knives.  Position shelves so they are not too high or low.  Place commonly used objects within easy reach.  If necessary, use a sturdy step stool with a grab bar when reaching.  Keep electrical cables out of the way.  Do not use floor polish or wax that makes floors slippery. If you must use wax, use non-skid floor  wax.  Remove throw rugs and  tripping hazards from the floor. STAIRWAYS  Never leave objects on stairs.  Place handrails on both sides of stairways and use them. Fix any loose handrails. Make sure handrails on both sides of the stairways are as long as the stairs.  Check carpeting to make sure it is firmly attached along stairs. Make repairs to worn or loose carpet promptly.  Avoid placing throw rugs at the top or bottom of stairways, or properly secure the rug with carpet tape to prevent slippage. Get rid of throw rugs, if possible.  Have an electrician put in a light switch at the top and bottom of the stairs. OTHER FALL PREVENTION TIPS  Wear low-heel or rubber-soled shoes that are supportive and fit well. Wear closed toe shoes.  When using a stepladder, make sure it is fully opened and both spreaders are firmly locked. Do not climb a closed stepladder.  Add color or contrast paint or tape to grab bars and handrails in your home. Place contrasting color strips on first and last steps.  Learn and use mobility aids as needed. Install an electrical emergency response system.  Turn on lights to avoid dark areas. Replace light bulbs that burn out immediately. Get light switches that glow.  Arrange furniture to create clear pathways. Keep furniture in the same place.  Firmly attach carpet with non-skid or double-sided tape.  Eliminate uneven floor surfaces.  Select a carpet pattern that does not visually hide the edge of steps.  Be aware of all pets. OTHER HOME SAFETY TIPS  Set the water temperature for 120 F (48.8 C).  Keep emergency numbers on or near the telephone.  Keep smoke detectors on every level of the home and near sleeping areas. Document Released: 06/13/2002 Document Revised: 12/23/2011 Document Reviewed: 09/12/2011 [facility] Patient Information 2014 [place], [place].     Neta Mends. Panosh M.D.     Pre visit review using our clinic review tool, if applicable.  No additional management support is needed unless otherwise documented below in the visit note.

## 2013-11-02 ENCOUNTER — Ambulatory Visit: Payer: Self-pay | Admitting: Obstetrics & Gynecology

## 2013-11-03 ENCOUNTER — Encounter: Payer: Self-pay | Admitting: Obstetrics & Gynecology

## 2013-11-04 ENCOUNTER — Ambulatory Visit (INDEPENDENT_AMBULATORY_CARE_PROVIDER_SITE_OTHER): Payer: Medicare Other | Admitting: Obstetrics & Gynecology

## 2013-11-04 ENCOUNTER — Encounter: Payer: Self-pay | Admitting: Obstetrics & Gynecology

## 2013-11-04 VITALS — BP 140/80 | HR 84 | Ht 63.0 in | Wt 118.0 lb

## 2013-11-04 DIAGNOSIS — Z01419 Encounter for gynecological examination (general) (routine) without abnormal findings: Secondary | ICD-10-CM

## 2013-11-04 DIAGNOSIS — Z124 Encounter for screening for malignant neoplasm of cervix: Secondary | ICD-10-CM

## 2013-11-04 NOTE — Patient Instructions (Signed)

## 2013-11-04 NOTE — Progress Notes (Signed)
79 y.o. G3P3 MarriedCaucasianF here for annual exam.  No vaginal bleeding.  Doing well.  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  Staying in the area.  Saw Dr. Fabian SharpPanosh 10/05/13.  Note reviewed.  Had excsional biopsy in September.  Pathology was negative.  MMG due.  Pt aware and will schedule.  Patient's last menstrual period was 07/07/1997.          Sexually active: yes  The current method of family planning is post menopausal status.    Exercising: yes  walking Smoker:  Former smoker   Health Maintenance: Pap:  06/26/10 WNL History of abnormal Pap:  no MMG:  07/09/12 MMG, 1/14-additional views, US, biopsies right breast-then excisional biopsy 9/14-scar tissue from needle biopsy Colonoscopy:  none BMD:   2010, osteoporosis TDaP:  2013 Screening Labs: PCP, Hb today: PCP, Urine today: PCP   reports that she quit smoking about 55 years ago. She has never used smokeless tobacco. She reports that she does not drink alcohol or use illicit drugs.  Past Medical History  Diagnosis Date  . Dyslipidemia     Significant drop in LDL from Lipitor even though LDL remains high  . Hypothyroidism     Thyroid surgery in the past, thyroid nodules followed by Dr.Ellison  . Carotid arterial disease     Doppler, June, 2011, stable, 60-79% R. ICA, 40-59% LICA  . Prominent abdominal aortic pulsation     No abdominal aneurysm by ultrasound  . Ejection fraction     EF 65%, nuclear, 2007  . Contact lens/glasses fitting     wears contacts or glasses  . CAD (coronary artery disease)     PCI to RCA and diagonal in remote past, residual 70% LAD  /   nuclear, 2007, no ischemia  . HTN (hypertension)      no med  . Lump or mass in breast 07/19/2007    Excised 01/12/13. B9 on pathQualifier: Diagnosis of  By: Fabian SharpPanosh MD, Neta MendsWanda K    . Fibrocystic breast   . Hypercholesterolemia   . Rectal fissure   . Osteoporosis     Past Surgical History  Procedure Laterality Date  . Thyroid cyst excision    . Coronary angioplasty  1993  .  Rectoperitoneal fistula closure    . Breast biopsy Right 01/12/2013    Procedure: Removal of right breast mass;  Surgeon: Currie Parishristian J Streck, MD;  Location: [facility];  Service: General;  Laterality: Right;  . Breast excisional biopsy  9/14    scar tissue from the needle biopsy    Current Outpatient Prescriptions  Medication Sig Dispense Refill  . Ascorbic Acid (VITAMIN C) 100 MG tablet Take 100 mg by mouth daily.        Marland Kitchen. aspirin 81 MG tablet Take 81 mg by mouth daily.        Marland Kitchen. atorvastatin (LIPITOR) 80 MG tablet Take 1 tablet (80 mg total) by mouth daily.  90 tablet  0  . fish oil-omega-3 fatty acids 1000 MG capsule Take 2 g by mouth daily.        Marland Kitchen. levothyroxine (SYNTHROID, LEVOTHROID) 50 MCG tablet Take 1 tablet (50 mcg total) by mouth daily. Brand name only  90 tablet  0  . MULTIPLE VITAMIN PO Take by mouth.         No current facility-administered medications for this visit.    Family History  Problem Relation Age of Onset  . Heart attack Father   . Osteoporosis Mother  ROS:  Pertinent items are noted in HPI.  Otherwise, a comprehensive ROS was negative.  Exam:   BP 140/80  Pulse 84  Ht 5\' 3"  (1.6 m)  Wt 118 lb (53.524 kg)  BMI 20.91 kg/m2  LMP 07/07/1997  Weight change: +5lb  Height: 5\' 3"  (160 cm)  Ht Readings from Last 3 Encounters:  11/04/13 5\' 3"  (1.6 m)  10/05/13 5\' 3"  (1.6 m)  09/21/13 5\' 3"  (1.6 m)    General appearance: alert, cooperative and appears stated age Head: Normocephalic, without obvious abnormality, atraumatic Neck: no adenopathy, supple, symmetrical, trachea midline and thyroid normal to inspection and palpation Lungs: clear to auscultation bilaterally Breasts: normal appearance, no masses or tenderness, pt has existing nodularieis in the right breast that are stable Heart: regular rate and rhythm Abdomen: soft, non-tender; bowel sounds normal; no masses,  no organomegaly Extremities: extremities normal, atraumatic, no  cyanosis or edema Skin: Skin color, texture, turgor normal. No rashes or lesions Lymph nodes: Cervical, supraclavicular, and axillary nodes normal. No abnormal inguinal nodes palpated Neurologic: Grossly normal   Pelvic: External genitalia:  no lesions              Urethra:  normal appearing urethra with no masses, tenderness or lesions              Bartholins and Skenes: normal                 Vagina: normal appearing vagina with normal color and discharge, no lesions              Cervix: no lesions              Pap taken: yes Bimanual Exam:  Uterus:  normal size, contour, position, consistency, mobility, non-tender              Adnexa: normal adnexa and no mass, fullness, tenderness               Rectovaginal: Confirms               Anus:  normal sphincter tone, no lesions  A:  Well Woman with normal exam PMP, no HRT Hypothyroidism Osteoporosis Elevated lipids  P:   Mammogram due.  Pt will schedule. pap smear obtained Labs with Dr. Fabian SharpPanosh Vaccines UTD Declines BMD as she will not proceed with any treatement. Declines colonoscopy.  She knows I disagree with this. return annually or prn  An After Visit Summary was printed and given to the patient.

## 2013-11-07 LAB — IPS PAP SMEAR ONLY

## 2013-12-22 ENCOUNTER — Telehealth: Payer: Self-pay | Admitting: Internal Medicine

## 2013-12-22 MED ORDER — LEVOTHYROXINE SODIUM 50 MCG PO TABS: 50.0000 ug | ORAL_TABLET | Freq: Every day | ORAL | Status: DC

## 2013-12-22 MED ORDER — ATORVASTATIN CALCIUM 80 MG PO TABS: 80.0000 mg | ORAL_TABLET | Freq: Every day | ORAL | Status: DC

## 2013-12-22 NOTE — Telephone Encounter (Signed)
Sent to the pharmacy by e-scribe. 

## 2013-12-22 NOTE — Telephone Encounter (Signed)
[facility] - [place], [place] - [address] is requesting 90 day re-fills on the following: atorvastatin (LIPITOR) 80 MG tablet levothyroxine (SYNTHROID, LEVOTHROID) 50 MCG tablet

## 2014-05-08 ENCOUNTER — Encounter: Payer: Self-pay | Admitting: Obstetrics & Gynecology

## 2014-06-12 ENCOUNTER — Ambulatory Visit (INDEPENDENT_AMBULATORY_CARE_PROVIDER_SITE_OTHER): Payer: Medicare Other | Admitting: Family Medicine

## 2014-06-12 DIAGNOSIS — Z23 Encounter for immunization: Secondary | ICD-10-CM

## 2014-09-14 ENCOUNTER — Other Ambulatory Visit: Payer: Self-pay | Admitting: Internal Medicine

## 2014-09-15 ENCOUNTER — Telehealth: Payer: Self-pay | Admitting: Family Medicine

## 2014-09-15 NOTE — Telephone Encounter (Signed)
Refilled once.  Pt is now due for her medicare wellness visit.  Will send a message to scheduling.

## 2014-09-15 NOTE — Telephone Encounter (Signed)
The pt is now due for her medicare wellness exam.  Please call the pt and make an appt.  Thanks!

## 2014-09-15 NOTE — Telephone Encounter (Signed)
vm has not been set up °

## 2014-09-19 NOTE — Telephone Encounter (Signed)
Pt has been sch for 12-20-14

## 2014-09-22 ENCOUNTER — Ambulatory Visit (INDEPENDENT_AMBULATORY_CARE_PROVIDER_SITE_OTHER): Payer: Medicare Other | Admitting: Cardiology

## 2014-09-22 ENCOUNTER — Encounter: Payer: Self-pay | Admitting: Cardiology

## 2014-09-22 VITALS — BP 146/82 | HR 83 | Ht 63.0 in | Wt 110.4 lb

## 2014-09-22 DIAGNOSIS — I2510 Atherosclerotic heart disease of native coronary artery without angina pectoris: Secondary | ICD-10-CM

## 2014-09-22 DIAGNOSIS — E785 Hyperlipidemia, unspecified: Secondary | ICD-10-CM

## 2014-09-22 DIAGNOSIS — I739 Peripheral vascular disease, unspecified: Secondary | ICD-10-CM

## 2014-09-22 DIAGNOSIS — R030 Elevated blood-pressure reading, without diagnosis of hypertension: Secondary | ICD-10-CM

## 2014-09-22 DIAGNOSIS — I779 Disorder of arteries and arterioles, unspecified: Secondary | ICD-10-CM

## 2014-09-22 NOTE — Patient Instructions (Addendum)
Your physician recommends that you continue on your current medications as directed. Please refer to the Current Medication list given to you today.  Your physician has requested that you have a carotid duplex. This test is an ultrasound of the carotid arteries in your neck. It looks at blood flow through these arteries that supply the brain with blood. Allow one hour for this exam. There are no restrictions or special instructions.  Your physician wants you to follow-up in: 1 year. You will receive a reminder letter in the mail two months in advance. If you don't receive a letter, please call our office to schedule the follow-up appointment.  

## 2014-09-22 NOTE — Assessment & Plan Note (Signed)
Patient had PCI to the RCA and diagonal in the remote past. There was 70% residual LAD disease. Nuclear scans in 2007 and 2013 revealed no ischemia. Ejection fraction has remained normal. She is on aspirin and high-dose statin. She is stable. No further workup is needed.

## 2014-09-22 NOTE — Assessment & Plan Note (Signed)
Historically we have never gotten her LDL to goal. However she had a significant drop when we started Lipitor in the past. I've chosen not to press for any other medications over time.

## 2014-09-22 NOTE — Progress Notes (Signed)
Cardiology Office Note   Date:  09/22/2014   ID:  Natasha Harper, DOB 1935-05-20, MRN [id number]  PCP:  Lorretta Harp, MD  Cardiologist:  Willa Rough, MD   Chief Complaint  Patient presents with  . Appointment    Follow-up coronary artery disease      History of Present Illness: Natasha Harper is a 79 y.o. female who presents today to follow-up coronary artery disease. I have followed her since the early 1990s. Her coronary disease is stable. She underwent stress testing in 2013 with no obvious ischemia. She has good left ventricular function. She has been on high-dose atorvastatin for many years. She is doing well. She is not having any chest pain or shortness of breath.    Past Medical History  Diagnosis Date  . Dyslipidemia     Significant drop in LDL from Lipitor even though LDL remains high  . Hypothyroidism     Thyroid surgery in the past, thyroid nodules followed by Dr.Ellison  . Carotid arterial disease     Doppler, June, 2011, stable, 60-79% R. ICA, 40-59% LICA  . Prominent abdominal aortic pulsation     No abdominal aneurysm by ultrasound  . Ejection fraction     EF 65%, nuclear, 2007  . Contact lens/glasses fitting     wears contacts or glasses  . CAD (coronary artery disease)     PCI to RCA and diagonal in remote past, residual 70% LAD  /   nuclear, 2007, no ischemia  . HTN (hypertension)      no med  . Lump or mass in breast 07/19/2007    Excised 01/12/13. B9 on pathQualifier: Diagnosis of  By: Fabian Sharp MD, Neta Mends    . Fibrocystic breast   . Hypercholesterolemia   . Rectal fissure   . Osteoporosis     Past Surgical History  Procedure Laterality Date  . Thyroid cyst excision    . Coronary angioplasty  1993  . Rectoperitoneal fistula closure    . Breast biopsy Right 01/12/2013    Procedure: Removal of right breast mass;  Surgeon: Currie Paris, MD;  Location: [facility];  Service: General;  Laterality: Right;  . Breast  excisional biopsy  9/14    scar tissue from the needle biopsy    Patient Active Problem List   Diagnosis Date Noted  . Decreased hearing 10/05/2013  . CAD (coronary artery disease)   . Dyslipidemia   . Carotid arterial disease   . Hypothyroidism   . Ejection fraction   . Visit for preventive health examination 06/02/2011  . Medication side effect 06/02/2011  . OSTEOPENIA 04/07/2008  . HYPERTENSION, WHITE COAT 04/07/2008  . HYPERGLYCEMIA, FASTING 07/21/2007  . HYPOTHYROIDISM 04/05/2007      Current Outpatient Prescriptions  Medication Sig Dispense Refill  . Ascorbic Acid (VITAMIN C) 100 MG tablet Take 100 mg by mouth daily.      Marland Kitchen aspirin 81 MG tablet Take 81 mg by mouth daily.      Marland Kitchen atorvastatin (LIPITOR) 80 MG tablet TAKE 1 BY MOUTH DAILY 90 tablet 0  . fish oil-omega-3 fatty acids 1000 MG capsule Take 2 g by mouth daily.      Marland Kitchen levothyroxine (SYNTHROID, LEVOTHROID) 50 MCG tablet Take 1 tablet (50 mcg total) by mouth daily. Brand name only 90 tablet 2  . MULTIPLE VITAMIN PO Take by mouth.       No current facility-administered medications for this visit.    Allergies:  Actonel; Evista; and Fosamax    Social History:  The patient  reports that she quit smoking about 56 years ago. She has never used smokeless tobacco. She reports that she does not drink alcohol or use illicit drugs.   Family History:  The patient's family history includes Heart attack in her father; Osteoporosis in her mother.    ROS:  Please see the history of present illness.     Patient denies fever, chills, headache, sweats, rash, change in vision, change in hearing, chest pain, cough, nausea or vomiting, urinary symptoms. All other systems are reviewed and are negative.   PHYSICAL EXAM: VS:  BP 146/82 mmHg  Pulse 83  Ht 5\' 3"  (1.6 m)  Wt 110 lb 6.4 oz (50.077 kg)  BMI 19.56 kg/m2  LMP 07/07/1997 , Patient is oriented to person time and place. Affect is normal. Head is atraumatic. Sclera and  conjunctiva are normal. There is no jugular venous distention. Lungs are clear. Respiratory effort is nonlabored. Cardiac exam reveals an S1 and S2. The abdomen is soft. There is no peripheral edema. There are no musculoskeletal deformities. There are no skin rashes.  EKG:  EKG is done today and reviewed by me. There is normal sinus rhythm. There is no significant change from the past.    Recent Labs: 10/05/2013: ALT 26; BUN 15; Creatinine 0.6; Hemoglobin 12.9; Platelets 242.0; Potassium 5.0; Sodium 136; TSH 0.44    Lipid Panel    Component Value Date/Time   CHOL 182 10/05/2013 1042   TRIG 57.0 10/05/2013 1042   HDL 52.10 10/05/2013 1042   CHOLHDL 3 10/05/2013 1042   VLDL 11.4 10/05/2013 1042   LDLCALC 119* 10/05/2013 1042   LDLDIRECT 152.1 01/16/2010 1015      Wt Readings from Last 3 Encounters:  09/22/14 110 lb 6.4 oz (50.077 kg)  11/04/13 118 lb (53.524 kg)  10/05/13 119 lb (53.978 kg)      Current medicines are reviewed  The patient understands her medications.     ASSESSMENT AND PLAN:

## 2014-09-22 NOTE — Assessment & Plan Note (Signed)
The patient has known carotid disease. I thought she had had a follow-up study in 2015. However after review the last report that I see was 2014. She will be scheduled for a carotid Doppler soon.

## 2014-09-22 NOTE — Assessment & Plan Note (Addendum)
She has well-documented whitecoat hypertension. Today her pressure is actually pretty good. No change in therapy.   The patient is aware that I will be retiring in September, 2016. I will be making arrangements for her to see Dr. Dietrich PatesPaula Ross for cardiology follow-up in the future.

## 2014-10-04 ENCOUNTER — Ambulatory Visit (HOSPITAL_COMMUNITY): Payer: Medicare Other | Attending: Cardiology | Admitting: Cardiology

## 2014-10-04 DIAGNOSIS — I779 Disorder of arteries and arterioles, unspecified: Secondary | ICD-10-CM | POA: Insufficient documentation

## 2014-10-04 DIAGNOSIS — I6523 Occlusion and stenosis of bilateral carotid arteries: Secondary | ICD-10-CM | POA: Diagnosis present

## 2014-10-04 DIAGNOSIS — I739 Peripheral vascular disease, unspecified: Secondary | ICD-10-CM

## 2014-10-04 NOTE — Progress Notes (Signed)
Carotid duplex performed 

## 2014-10-06 ENCOUNTER — Other Ambulatory Visit: Payer: Self-pay | Admitting: Internal Medicine

## 2014-10-06 NOTE — Telephone Encounter (Signed)
Sent to the pharmacy by e-scribe. 

## 2014-10-10 ENCOUNTER — Telehealth: Payer: Self-pay | Admitting: Internal Medicine

## 2014-10-10 NOTE — Telephone Encounter (Signed)
Spoke to the pt.  She is paying out of pocket for the name brand.  No authorization required.

## 2014-10-10 NOTE — Telephone Encounter (Signed)
PA request was received for Synthroid 50 mcg.  [facility] prefers generic.  Is there a reason why patient is taking Brand?

## 2014-10-11 NOTE — Telephone Encounter (Signed)
Ok

## 2014-10-17 ENCOUNTER — Telehealth: Payer: Self-pay | Admitting: Internal Medicine

## 2014-10-17 NOTE — Telephone Encounter (Signed)
[facility] has questions that need to be answered before her SYNTHROID 50 MCG tablet  Can be approved. What meds tried and failed? They faxed info 04/06.

## 2014-10-17 NOTE — Telephone Encounter (Signed)
Patient pays out of pocket for medication.

## 2014-10-23 NOTE — Telephone Encounter (Signed)
PA was submitted and approved for the below medication.

## 2014-12-20 ENCOUNTER — Ambulatory Visit (INDEPENDENT_AMBULATORY_CARE_PROVIDER_SITE_OTHER): Payer: Medicare Other | Admitting: Internal Medicine

## 2014-12-20 ENCOUNTER — Encounter: Payer: Self-pay | Admitting: Internal Medicine

## 2014-12-20 VITALS — BP 124/70 | Temp 98.1°F | Ht 62.25 in | Wt 109.8 lb

## 2014-12-20 DIAGNOSIS — E785 Hyperlipidemia, unspecified: Secondary | ICD-10-CM

## 2014-12-20 DIAGNOSIS — M899 Disorder of bone, unspecified: Secondary | ICD-10-CM

## 2014-12-20 DIAGNOSIS — E039 Hypothyroidism, unspecified: Secondary | ICD-10-CM | POA: Diagnosis not present

## 2014-12-20 DIAGNOSIS — Z0000 Encounter for general adult medical examination without abnormal findings: Secondary | ICD-10-CM | POA: Insufficient documentation

## 2014-12-20 DIAGNOSIS — R030 Elevated blood-pressure reading, without diagnosis of hypertension: Secondary | ICD-10-CM

## 2014-12-20 DIAGNOSIS — Z23 Encounter for immunization: Secondary | ICD-10-CM

## 2014-12-20 DIAGNOSIS — M949 Disorder of cartilage, unspecified: Secondary | ICD-10-CM

## 2014-12-20 DIAGNOSIS — IMO0001 Reserved for inherently not codable concepts without codable children: Secondary | ICD-10-CM

## 2014-12-20 LAB — CBC WITH DIFFERENTIAL/PLATELET
Basophils Absolute: 0 10*3/uL (ref 0.0–0.1)
Basophils Relative: 0.5 % (ref 0.0–3.0)
EOS ABS: 0 10*3/uL (ref 0.0–0.7)
Eosinophils Relative: 0.2 % (ref 0.0–5.0)
HCT: 40.4 % (ref 36.0–46.0)
Hemoglobin: 13.4 g/dL (ref 12.0–15.0)
LYMPHS PCT: 20.9 % (ref 12.0–46.0)
Lymphs Abs: 1.1 10*3/uL (ref 0.7–4.0)
MCHC: 33.2 g/dL (ref 30.0–36.0)
MCV: 100.9 fl — ABNORMAL HIGH (ref 78.0–100.0)
Monocytes Absolute: 0.4 10*3/uL (ref 0.1–1.0)
Monocytes Relative: 7.5 % (ref 3.0–12.0)
NEUTROS PCT: 70.9 % (ref 43.0–77.0)
Neutro Abs: 3.6 10*3/uL (ref 1.4–7.7)
PLATELETS: 235 10*3/uL (ref 150.0–400.0)
RBC: 4.01 Mil/uL (ref 3.87–5.11)
RDW: 13.7 % (ref 11.5–15.5)
WBC: 5 10*3/uL (ref 4.0–10.5)

## 2014-12-20 LAB — HEPATIC FUNCTION PANEL
ALT: 25 U/L (ref 0–35)
AST: 25 U/L (ref 0–37)
Albumin: 4.4 g/dL (ref 3.5–5.2)
Alkaline Phosphatase: 53 U/L (ref 39–117)
Bilirubin, Direct: 0.1 mg/dL (ref 0.0–0.3)
TOTAL PROTEIN: 7 g/dL (ref 6.0–8.3)
Total Bilirubin: 0.7 mg/dL (ref 0.2–1.2)

## 2014-12-20 LAB — LIPID PANEL
CHOL/HDL RATIO: 4
Cholesterol: 189 mg/dL (ref 0–200)
HDL: 53.6 mg/dL (ref >39.00)
LDL CALC: 124 mg/dL — AB (ref 0–99)
NonHDL: 135.4
TRIGLYCERIDES: 57 mg/dL (ref 0.0–149.0)
VLDL: 11.4 mg/dL (ref 0.0–40.0)

## 2014-12-20 LAB — BASIC METABOLIC PANEL
BUN: 14 mg/dL (ref 6–23)
CO2: 28 mEq/L (ref 19–32)
Calcium: 9.7 mg/dL (ref 8.4–10.5)
Chloride: 103 mEq/L (ref 96–112)
Creatinine, Ser: 0.63 mg/dL (ref 0.40–1.20)
GFR: 97.22 mL/min (ref >60.00)
GLUCOSE: 99 mg/dL (ref 70–99)
POTASSIUM: 4.9 meq/L (ref 3.5–5.1)
SODIUM: 137 meq/L (ref 135–145)

## 2014-12-20 LAB — TSH: TSH: 0.63 u[IU]/mL (ref 0.35–4.50)

## 2014-12-20 LAB — VITAMIN D 25 HYDROXY (VIT D DEFICIENCY, FRACTURES): VITD: 22.83 ng/mL — ABNORMAL LOW (ref 30.00–100.00)

## 2014-12-20 MED ORDER — SYNTHROID 50 MCG PO TABS: ORAL_TABLET | ORAL | Status: DC

## 2014-12-20 MED ORDER — ATORVASTATIN CALCIUM 80 MG PO TABS: ORAL_TABLET | ORAL | Status: DC

## 2014-12-20 NOTE — Progress Notes (Signed)
Pre visit review using our clinic review tool, if applicable. No additional management support is needed unless otherwise documented below in the visit note.  Chief Complaint  Patient presents with  . Medicare Wellness    HPI: Natasha Harper 81 y.o. comes in today for Preventive Medicare wellness visit . She feels she is doing quite well .  No cv sx  At this time  Thyroid no change med taking needs refill Lipid  On hgihg dose for vascular risk no se reported   Health Maintenance  Topic Date Due  . DEXA SCAN  04/21/2002  . INFLUENZA VACCINE  02/05/2015  . COLONOSCOPY  06/01/2021  . TETANUS/TDAP  12/19/2024  . ZOSTAVAX  Completed  . PNA vac Low Risk Adult  Completed   Health Maintenance Review LIFESTYLE:  Exercise:  Walking  Tobacco/ETS:no only smoke for  1 years years ago  Alcohol: ocass red wine  Sugar beverages:  no Sleep:  Ok 7-8  Drug use: no Bone density:  Colonoscopy:   Dr Hyacinth Meeker does  Stool tests  Due mammo  MEDICARE DOCUMENT QUESTIONS  TO SCAN   Hearing: ok  No change  Dec hearing  functions well   Vision:  No limitations at present . Last eye check UTD  Safety:  Has smoke detector and wears seat belts.  No firearms. No excess sun exposure. Sees dentist regularly.  Falls: no  Advance directive :  Reviewed  doesnt have one.  HO given   Memory: Felt to be good  , no concern from her or her family.  Depression: No anhedonia unusual crying or depressive symptoms  Nutrition: Eats well balanced diet; adequate calcium and vitamin D. No swallowing chewing problems.  Injury: no major injuries in the last six months.  Other healthcare providers:  Reviewed today .  Social:  Lives with spouse married. No pets.   hh of 2   Preventive parameters: up-to-date  Reviewed   ADLS:   There are no problems or need for assistance  driving, feeding, obtaining food, dressing, toileting and bathing, managing money using phone. She is independent.     ROS:  GEN/  HEENT: No fever, significant weight changes sweats headaches vision problems hearing changes, CV/ PULM; No chest pain shortness of breath cough, syncope,edema  change in exercise tolerance. GI /GU: No adominal pain, vomiting, change in bowel habits. No blood in the stool. No significant GU symptoms. SKIN/HEME: ,no acute skin rashes suspicious lesions or bleeding. No lymphadenopathy, nodules, masses.  NEURO/ PSYCH:  No neurologic signs such as weakness numbness. No depression anxiety. IMM/ Allergy: No unusual infections.  Allergy .   REST of 12 system review negative except as per HPI   Past Medical History  Diagnosis Date  . Dyslipidemia     Significant drop in LDL from Lipitor even though LDL remains high  . Hypothyroidism     Thyroid surgery in the past, thyroid nodules followed by Dr.Ellison  . Carotid arterial disease     Doppler, June, 2011, stable, 60-79% R. ICA, 40-59% LICA  . Prominent abdominal aortic pulsation     No abdominal aneurysm by ultrasound  . Ejection fraction     EF 65%, nuclear, 2007  . Contact lens/glasses fitting     wears contacts or glasses  . CAD (coronary artery disease)     PCI to RCA and diagonal in remote past, residual 70% LAD  /   nuclear, 2007, no ischemia  . HTN (hypertension)  no med  . Lump or mass in breast 07/19/2007    Excised 01/12/13. B9 on pathQualifier: Diagnosis of  By: Fabian Sharp MD, Neta Mends    . Fibrocystic breast   . Hypercholesterolemia   . Rectal fissure   . Osteoporosis     Family History  Problem Relation Age of Onset  . Heart attack Father   . Osteoporosis Mother     History   Social History  . Marital Status: Married    Spouse Name: N/A  . Number of Children: N/A  . Years of Education: N/A   Social History Main Topics  . Smoking status: Former Smoker    Quit date: 07/07/1958  . Smokeless tobacco: Never Used  . Alcohol Use: No  . Drug Use: No  . Sexual Activity:    Partners: Male   Other Topics Concern  .  None   Social History Narrative   HH     Of 2  No pets    Family from [place]    Exercise   Walks every day at least 30 minutes  2 miles .   Sleep  Ok 10 - 7    Just moved to condo      Outpatient Encounter Prescriptions as of 12/20/2014  Medication Sig  . Ascorbic Acid (VITAMIN C) 100 MG tablet Take 100 mg by mouth daily.    Marland Kitchen aspirin 81 MG tablet Take 81 mg by mouth daily.    Marland Kitchen atorvastatin (LIPITOR) 80 MG tablet TAKE 1 BY MOUTH DAILY  . fish oil-omega-3 fatty acids 1000 MG capsule Take 2 g by mouth daily.    . MULTIPLE VITAMIN PO Take by mouth.    . SYNTHROID 50 MCG tablet TAKE 1 BY MOUTH DAILY  . [DISCONTINUED] atorvastatin (LIPITOR) 80 MG tablet TAKE 1 BY MOUTH DAILY  . [DISCONTINUED] SYNTHROID 50 MCG tablet TAKE 1 BY MOUTH DAILY   No facility-administered encounter medications on file as of 12/20/2014.    EXAM:  BP 124/70 mmHg  Temp(Src) 98.1 F (36.7 C) (Oral)  Ht 5' 2.25" (1.581 m)  Wt 109 lb 12.8 oz (49.805 kg)  BMI 19.93 kg/m2  LMP 07/07/1997  Body mass index is 19.93 kg/(m^2).  Physical Exam: Vital signs reviewed [id number] is a well-developed well-nourished alert cooperative   who appears stated age in no acute distress.  HEENT: normocephalic atraumatic , Eyes: PERRL EOM's full, conjunctiva clear, Nares: paten,t no deformity discharge or tenderness., Ears: no deformity EAC's clear TMs with normal landmarks. Mouth: clear OP, no lesions, edema.  Moist mucous membranes. Dentition in adequate repair. NECK: supple without masses, thyromegaly or bruits. CHEST/PULM:  Clear to auscultation and percussion breath sounds equal no wheeze , rales or rhonchi. No chest wall deformities or tenderness.Breast: normal by inspection . No dimpling, discharge, masses, tenderness or discharge . CV: PMI is nondisplaced, S1 S2 no gallops, murmurs, rubs. Peripheral pulses are full without delay.No JVD .  ABDOMEN: Bowel sounds normal nontender  No guard or rebound, no hepato splenomegal  no CVA tenderness.  No hernia. Extremtities:  No clubbing cyanosis or edema, no acute joint swelling or redness no focal atrophy  Vv noted pronation left foot  NEURO:  Oriented x3, cranial nerves 3-12 appear to be intact, no obvious focal weakness,gait within normal limits SKIN: No acute rashes normal turgor, color, no bruising or petechiae. PSYCH: Oriented, good eye contact, no obvious depression anxiety, cognition and judgment appear normal. LN: no cervical axillary inguinal adenopathy No noted deficits in  memory, attention, and speech.   Lab Results  Component Value Date   WBC 4.2* 10/05/2013   HGB 12.9 10/05/2013   HCT 38.5 10/05/2013   PLT 242.0 10/05/2013   GLUCOSE 98 10/05/2013   CHOL 182 10/05/2013   TRIG 57.0 10/05/2013   HDL 52.10 10/05/2013   LDLDIRECT 152.1 01/16/2010   LDLCALC 119* 10/05/2013   ALT 26 10/05/2013   AST 24 10/05/2013   NA 136 10/05/2013   K 5.0 10/05/2013   CL 103 10/05/2013   CREATININE 0.6 10/05/2013   BUN 15 10/05/2013   CO2 26 10/05/2013   TSH 0.44 10/05/2013   HGBA1C 5.7 10/05/2013  .hm BP Readings from Last 3 Encounters:  12/20/14 124/70  09/22/14 146/82  11/04/13 140/80    ASSESSMENT AND PLAN:  Discussed the following assessment and plan:  Visit for preventive health examination - Plan: Basic metabolic panel, CBC with Differential/Platelet, Hepatic function panel, Lipid panel, TSH, Vit D  25 hydroxy (rtn osteoporosis monitoring)  Medicare annual wellness visit, subsequent - Ho on advance directives  review  Hypothyroidism, unspecified hypothyroidism type - lab and refill  - Plan: Basic metabolic panel, CBC with Differential/Platelet, Hepatic function panel, Lipid panel, TSH, Vit D  25 hydroxy (rtn osteoporosis monitoring)  Hyperlipidemia - Plan: Basic metabolic panel, CBC with Differential/Platelet, Hepatic function panel, Lipid panel, TSH, Vit D  25 hydroxy (rtn osteoporosis monitoring)  Need for tetanus booster - Plan: Td vaccine  greater than or equal to 7yo preservative free IM  Disorder of bone and cartilage - bone helth not interested in taking meds exericsien vit d check level today - Plan: Basic metabolic panel, CBC with Differential/Platelet, Hepatic function panel, Lipid panel, TSH, Vit D  25 hydroxy (rtn osteoporosis monitoring)  Elevated BP - reported normal at home  continue to monitor  Transitioning to dr Tenny Craw dr Myrtis Ser retiring follws catotid disease lipids cad  And wc ht. Not interested in osteoporosis medication  So declines dexa   Patient Care Team: [name], MD as PCP - General Luis Abed, MD (Cardiology) Jerene Bears, MD (Gynecology)  Patient Instructions  Continue lifestyle intervention healthy eating and exercise .  Healthy lifestyle includes : At least 150 minutes of exercise weeks  , weight at healthy levels, which is usually   BMI 19-25. Avoid trans fats and processed foods;  Increase fresh fruits and veges to 5 servings per day. And avoid sweet beverages including tea and juice. Mediterranean diet with olive oil and nuts have been noted to be heart and brain healthy . Avoid tobacco products . Limit  alcohol to  7 per week for women and 14 servings for men.  Get adequate sleep . Wear seat belts . Don't text and drive .   Will notify you  of labs when available. Due for mammogram  Check bp readings at home  Can do daily for 1 week per month to make sure in range  Best is below 140/90 however below 145 is acceptable   Address Advance directive  Pearlean Brownie. Panosh M.D.

## 2014-12-20 NOTE — Patient Instructions (Addendum)
Continue lifestyle intervention healthy eating and exercise .  Healthy lifestyle includes : At least 150 minutes of exercise weeks  , weight at healthy levels, which is usually   BMI 19-25. Avoid trans fats and processed foods;  Increase fresh fruits and veges to 5 servings per day. And avoid sweet beverages including tea and juice. Mediterranean diet with olive oil and nuts have been noted to be heart and brain healthy . Avoid tobacco products . Limit  alcohol to  7 per week for women and 14 servings for men.  Get adequate sleep . Wear seat belts . Don't text and drive .   Will notify you  of labs when available. Due for mammogram  Check bp readings at home  Can do daily for 1 week per month to make sure in range  Best is below 140/90 however below 145 is acceptable   Address Advance directive  [facility]

## 2014-12-21 ENCOUNTER — Ambulatory Visit (INDEPENDENT_AMBULATORY_CARE_PROVIDER_SITE_OTHER): Payer: Medicare Other | Admitting: Obstetrics & Gynecology

## 2014-12-21 ENCOUNTER — Encounter: Payer: Self-pay | Admitting: Obstetrics & Gynecology

## 2014-12-21 VITALS — BP 130/72 | HR 80 | Resp 14 | Ht 62.5 in | Wt 110.0 lb

## 2014-12-21 DIAGNOSIS — Z01419 Encounter for gynecological examination (general) (routine) without abnormal findings: Secondary | ICD-10-CM

## 2014-12-21 DIAGNOSIS — Z124 Encounter for screening for malignant neoplasm of cervix: Secondary | ICD-10-CM

## 2014-12-21 NOTE — Progress Notes (Signed)
Patient ID: Natasha Harper, female   DOB: [birth year], 80 y.o.   MRN: [id number]   80 y.o. G3P3 MarriedCaucasianF here for annual exam.  Doing well.  Saw Dr. Myrtis Ser in march.  Had carotid dopplers in April.  These were stable.  This shows stenosis that hasn't changed.  Will have repeated in one year.    Denies vaginal bleeding.  Husband having cataract surgery.  Pt knows she is overdue for her MMG.  She will schedule after her husband's surgery for each eye is scheduled.    PCP:  Dr. Fabian Sharp.  Just saw her yesterday.    Patient's last menstrual period was 07/07/1997.          Sexually active: Yes.    The current method of family planning is post menopausal status.    Exercising: Yes.    walking Smoker:  No- (Former smoker)  Health Maintenance: Pap:  11-04-13  History of abnormal Pap:  no MMG:  1/14-additional views, [place], biopsies right breast-then excisional biopsy 9/14-scar tissue from needle biopsy Colonoscopy:  Never BMD:   2010 osteoporosis  TD: 12-20-14 Screening Labs: PCP does labs Urine today: PCP    reports that she quit smoking about 56 years ago. She has never used smokeless tobacco. She reports that she does not drink alcohol or use illicit drugs.  Past Medical History  Diagnosis Date  . Dyslipidemia     Significant drop in LDL from Lipitor even though LDL remains high  . Hypothyroidism     Thyroid surgery in the past, thyroid nodules followed by Dr.Ellison  . Carotid arterial disease     Doppler, June, 2011, stable, 60-79% R. ICA, 40-59% LICA  . Prominent abdominal aortic pulsation     No abdominal aneurysm by ultrasound  . Ejection fraction     EF 65%, nuclear, 2007  . Contact lens/glasses fitting     wears contacts or glasses  . CAD (coronary artery disease)     PCI to RCA and diagonal in remote past, residual 70% LAD  /   nuclear, 2007, no ischemia  . HTN (hypertension)      no med  . Lump or mass in breast 07/19/2007    Excised 01/12/13. B9 on pathQualifier:  Diagnosis of  By: Fabian Sharp MD, Neta Mends    . Fibrocystic breast   . Hypercholesterolemia   . Rectal fissure   . Osteoporosis     Past Surgical History  Procedure Laterality Date  . Thyroid cyst excision    . Coronary angioplasty  1993  . Rectoperitoneal fistula closure    . Breast biopsy Right 01/12/2013    Procedure: Removal of right breast mass;  Surgeon: Currie Paris, MD;  Location: [facility];  Service: General;  Laterality: Right;  . Breast excisional biopsy  9/14    scar tissue from the needle biopsy    Current Outpatient Prescriptions  Medication Sig Dispense Refill  . Ascorbic Acid (VITAMIN C) 100 MG tablet Take 100 mg by mouth daily.      Marland Kitchen aspirin 81 MG tablet Take 81 mg by mouth daily.      Marland Kitchen atorvastatin (LIPITOR) 80 MG tablet TAKE 1 BY MOUTH DAILY 90 tablet 3  . fish oil-omega-3 fatty acids 1000 MG capsule Take 2 g by mouth daily.      . MULTIPLE VITAMIN PO Take by mouth.      . SYNTHROID 50 MCG tablet TAKE 1 BY MOUTH DAILY 90 tablet 3  No current facility-administered medications for this visit.    Family History  Problem Relation Age of Onset  . Heart attack Father   . Osteoporosis Mother     ROS:  Pertinent items are noted in HPI.  Otherwise, a comprehensive ROS was negative.  Exam:   BP 130/72 mmHg  Pulse 80  Resp 14  Ht 5' 2.5" (1.588 m)  Wt 110 lb (49.896 kg)  BMI 19.79 kg/m2  LMP 07/07/1997  Weight change: -8#  Height: 5' 2.5" (158.8 cm)  Ht Readings from Last 3 Encounters:  12/21/14 5' 2.5" (1.588 m)  12/20/14 5' 2.25" (1.581 m)  09/22/14 5\' 3"  (1.6 m)    General appearance: alert, cooperative and appears stated age Head: Normocephalic, without obvious abnormality, atraumatic Neck: no adenopathy, supple, symmetrical, trachea midline and thyroid normal to inspection and palpation Lungs: clear to auscultation bilaterally Breasts: normal appearance, left breast masses that have been present for years Heart: regular rate and  rhythm Abdomen: soft, non-tender; bowel sounds normal; no masses,  no organomegaly Extremities: extremities normal, atraumatic, no cyanosis or edema Skin: Skin color, texture, turgor normal. No rashes or lesions Lymph nodes: Cervical, supraclavicular, and axillary nodes normal. No abnormal inguinal nodes palpated Neurologic: Grossly normal   Pelvic: External genitalia:  no lesions              Urethra:  normal appearing urethra with no masses, tenderness or lesions              Bartholins and Skenes: normal                 Vagina: normal appearing vagina with normal color and discharge, no lesions              Cervix: no lesions              Pap taken: No. Bimanual Exam:  Uterus:  normal size, contour, position, consistency, mobility, non-tender              Adnexa: normal adnexa and no mass, fullness, tenderness               Rectovaginal: Confirms               Anus:  normal sphincter tone, no lesions  Chaperone was present for exam.   A: Well Woman with normal exam PMP, no HRT Hypothyroidism Osteoporosis Elevated lipids Coronary artery disease--followed by Dr. Myrtis Ser Carotid artery disease--doing yearly dopplers  P: Mammogram due. Pt will schedule. Info for breast center given pap smear obtained 2015.  No pap today. Labs with Dr. Fabian Sharp Declines BMD as she will not proceed with any treatement. Declines colonoscopy. Has never had one.  Encouraged but again she declined.  She knows I disagree with this. return annually or prn

## 2015-06-15 ENCOUNTER — Encounter: Payer: Self-pay | Admitting: Internal Medicine

## 2015-06-19 ENCOUNTER — Ambulatory Visit (INDEPENDENT_AMBULATORY_CARE_PROVIDER_SITE_OTHER): Payer: Medicare Other | Admitting: Family Medicine

## 2015-06-19 DIAGNOSIS — Z23 Encounter for immunization: Secondary | ICD-10-CM | POA: Diagnosis not present

## 2015-09-20 ENCOUNTER — Other Ambulatory Visit: Payer: Self-pay | Admitting: Internal Medicine

## 2015-09-20 DIAGNOSIS — I6523 Occlusion and stenosis of bilateral carotid arteries: Secondary | ICD-10-CM

## 2015-10-04 ENCOUNTER — Ambulatory Visit (HOSPITAL_COMMUNITY)
Admission: RE | Admit: 2015-10-04 | Discharge: 2015-10-04 | Disposition: A | Discharge status: Home / Self Care | Payer: Medicare Other | Source: Ambulatory Visit | Attending: Internal Medicine | Admitting: Internal Medicine

## 2015-10-04 DIAGNOSIS — E785 Hyperlipidemia, unspecified: Secondary | ICD-10-CM | POA: Diagnosis not present

## 2015-10-04 DIAGNOSIS — I10 Essential (primary) hypertension: Secondary | ICD-10-CM | POA: Insufficient documentation

## 2015-10-04 DIAGNOSIS — I6523 Occlusion and stenosis of bilateral carotid arteries: Secondary | ICD-10-CM

## 2015-10-05 ENCOUNTER — Ambulatory Visit (INDEPENDENT_AMBULATORY_CARE_PROVIDER_SITE_OTHER): Payer: Medicare Other | Admitting: Internal Medicine

## 2015-10-05 ENCOUNTER — Encounter: Payer: Self-pay | Admitting: Internal Medicine

## 2015-10-05 VITALS — BP 156/74 | HR 87 | Ht 63.0 in | Wt 114.8 lb

## 2015-10-05 DIAGNOSIS — Z7982 Long term (current) use of aspirin: Secondary | ICD-10-CM

## 2015-10-05 DIAGNOSIS — E785 Hyperlipidemia, unspecified: Secondary | ICD-10-CM

## 2015-10-05 DIAGNOSIS — Z1329 Encounter for screening for other suspected endocrine disorder: Secondary | ICD-10-CM

## 2015-10-05 DIAGNOSIS — R030 Elevated blood-pressure reading, without diagnosis of hypertension: Secondary | ICD-10-CM

## 2015-10-05 NOTE — Patient Instructions (Signed)
**Note De-Identified Natasha Harper Obfuscation** Medication Instructions:  Same-no changes  Labwork: Lipids, CBC, CMET and TSH on 10/10/15 anytime between 7:30 and 5:00 at this office.  Testing/Procedures: None  Follow-Up: Your physician wants you to follow-up in: 1 year. You will receive a reminder letter in the mail two months in advance. If you don't receive a letter, please call our office to schedule the follow-up appointment.     If you need a refill on your cardiac medications before your next appointment, please call your pharmacy.

## 2015-10-05 NOTE — Progress Notes (Signed)
Cardiology Office Note   Date:  10/05/2015   ID:  Natasha Prestorlene G Garlington, DOB [birth year], MRN [id number]  PCP:  Lorretta HarpPANOSH,Natasha KOTVAN, MD  Cardiologist:   Dietrich PatesPaula Ross, MD    Pt presents for f/u of CAD and CV dz   History of Present Illness: Natasha Harper is a 81 y.o. female with a history of CAD  Remoe intervention in 1993   HL,  Last stres test in 2013  Also a history of CV dz  Just had carotid USN  Moderate stable plaquing    Feeling great  Breathing good  Walks 2xmlies daily when weather No dizziness  er   Outpatient Prescriptions Prior to Visit  Medication Sig Dispense Refill  . Ascorbic Acid (VITAMIN C) 100 MG tablet Take 100 mg by mouth daily.      Marland Kitchen. aspirin 81 MG tablet Take 81 mg by mouth daily.      Marland Kitchen. atorvastatin (LIPITOR) 80 MG tablet TAKE 1 BY MOUTH DAILY 90 tablet 3  . fish oil-omega-3 fatty acids 1000 MG capsule Take 2 g by mouth daily.      . MULTIPLE VITAMIN PO Take 1 tablet by mouth daily.     Marland Kitchen. SYNTHROID 50 MCG tablet TAKE 1 BY MOUTH DAILY 90 tablet 3   No facility-administered medications prior to visit.     Allergies:   Actonel; Evista; and Fosamax   Past Medical History  Diagnosis Date  . Dyslipidemia     Significant drop in LDL from Lipitor even though LDL remains high  . Hypothyroidism     Thyroid surgery in the past, thyroid nodules followed by Dr.Ellison  . Carotid arterial disease (HCC)     Doppler, June, 2011, stable, 60-79% R. ICA, 40-59% LICA  . Prominent abdominal aortic pulsation     No abdominal aneurysm by ultrasound  . Ejection fraction     EF 65%, nuclear, 2007  . Contact lens/glasses fitting     wears contacts or glasses  . CAD (coronary artery disease)     PCI to RCA and diagonal in remote past, residual 70% LAD  /   nuclear, 2007, no ischemia  . HTN (hypertension)      no med  . Lump or mass in breast 07/19/2007    Excised 01/12/13. B9 on pathQualifier: Diagnosis of  By: Fabian SharpPanosh MD, Neta MendsWanda K    . Fibrocystic breast   .  Hypercholesterolemia   . Rectal fissure   . Osteoporosis     Past Surgical History  Procedure Laterality Date  . Thyroid cyst excision    . Coronary angioplasty  1993  . Rectoperitoneal fistula closure    . Breast biopsy Right 01/12/2013    Procedure: Removal of right breast mass;  Surgeon: Currie Parishristian J Streck, MD;  Location: [facility];  Service: General;  Laterality: Right;  . Breast excisional biopsy  9/14    scar tissue from the needle biopsy     Social History:  The patient  reports that she quit smoking about 57 years ago. She has never used smokeless tobacco. She reports that she does not drink alcohol or use illicit drugs.   Family History:  The patient's family history includes Heart attack in her father; Osteoporosis in her mother.    ROS:  Please see the history of present illness. All other systems are reviewed and  Negative to the above problem except as noted.    PHYSICAL EXAM: VS:  BP 156/74 mmHg  Pulse 87  Ht  (1.6 m)  Wt 114 lb 12.8 oz (52.073 kg)  BMI 20.34 kg/m2  LMP 07/07/1997   BP was 165/85 on my check   GEN:  Thin d, in no acute distress HEENT: normal Neck: no JVD, + carotid bruits No masses Cardiac: RRR; no murmurs, rubs, or gallops,no edema  Respiratory:  clear to auscultation bilaterally, normal work of breathing GI: soft, nontender, nondistended, + BS  No hepatomegaly  MS: no deformity Moving all extremities   Skin: warm and dry, no rash Neuro:  Strength and sensation are intact Psych: euthymic mood, full affect   EKG:  EKG is ordered today.  SR 87 bpm     Lipid Panel    Component Value Date/Time   CHOL 189 12/20/2014 1057   TRIG 57.0 12/20/2014 1057   HDL 53.60 12/20/2014 1057   CHOLHDL 4 12/20/2014 1057   VLDL 11.4 12/20/2014 1057   LDLCALC 124* 12/20/2014 1057   LDLDIRECT 152.1 01/16/2010 1015      Wt Readings from Last 3 Encounters:  10/05/15 114 lb 12.8 oz (52.073 kg)  12/21/14 110 lb (49.896 kg)    12/20/14 109 lb 12.8 oz (49.805 kg)      ASSESSMENT AND PLAN:  1  CAD  No symptsm of angina (difficulty swallowing at time)  Follow  2  HL  Will get lipdis    3  BP  HIgh today  She says it is always better at home   I have asked her to come in with BP cuff  Compare  Bring when she comes with labs  She should also take cuff and readings to her appt with W Panosh in June   4  CV dz  Moderate plaquing of carotids  F/U in 1 year    Get all her labs for her physical in June    Follow up in 1 year      Signed, Dietrich Pates, MD  10/05/2015 10:12 AM    [facility] [address] [place], [place], [place]  [address] Phone: [phone]; Fax: [phone]

## 2015-10-10 ENCOUNTER — Other Ambulatory Visit (INDEPENDENT_AMBULATORY_CARE_PROVIDER_SITE_OTHER): Payer: Medicare Other | Admitting: *Deleted

## 2015-10-10 ENCOUNTER — Ambulatory Visit (INDEPENDENT_AMBULATORY_CARE_PROVIDER_SITE_OTHER): Payer: Medicare Other | Admitting: *Deleted

## 2015-10-10 VITALS — BP 169/82 | HR 96 | Resp 18

## 2015-10-10 DIAGNOSIS — Z136 Encounter for screening for cardiovascular disorders: Secondary | ICD-10-CM

## 2015-10-10 DIAGNOSIS — Z7982 Long term (current) use of aspirin: Secondary | ICD-10-CM | POA: Diagnosis not present

## 2015-10-10 DIAGNOSIS — E785 Hyperlipidemia, unspecified: Secondary | ICD-10-CM | POA: Diagnosis not present

## 2015-10-10 DIAGNOSIS — Z0130 Encounter for examination of blood pressure without abnormal findings: Secondary | ICD-10-CM

## 2015-10-10 DIAGNOSIS — Z1329 Encounter for screening for other suspected endocrine disorder: Secondary | ICD-10-CM | POA: Diagnosis not present

## 2015-10-10 LAB — LIPID PANEL
CHOL/HDL RATIO: 2.9 ratio (ref <=5.0)
Cholesterol: 181 mg/dL (ref 125–200)
HDL: 62 mg/dL (ref >=46)
LDL Cholesterol: 107 mg/dL (ref <130)
Triglycerides: 58 mg/dL (ref <150)
VLDL: 12 mg/dL (ref <30)

## 2015-10-10 LAB — CBC WITH DIFFERENTIAL/PLATELET
BASOS PCT: 0 %
Basophils Absolute: 0 cells/uL (ref 0–200)
EOS ABS: 0 {cells}/uL — AB (ref 15–500)
Eosinophils Relative: 0 %
HEMATOCRIT: 40.6 % (ref 35.0–45.0)
Hemoglobin: 13.4 g/dL (ref 11.7–15.5)
LYMPHS ABS: 1342 {cells}/uL (ref 850–3900)
Lymphocytes Relative: 22 %
MCH: 33.3 pg — ABNORMAL HIGH (ref 27.0–33.0)
MCHC: 33 g/dL (ref 32.0–36.0)
MCV: 100.7 fL — AB (ref 80.0–100.0)
MONO ABS: 366 {cells}/uL (ref 200–950)
MPV: 10.2 fL (ref 7.5–12.5)
Monocytes Relative: 6 %
Neutro Abs: 4392 cells/uL (ref 1500–7800)
Neutrophils Relative %: 72 %
Platelets: 261 10*3/uL (ref 140–400)
RBC: 4.03 MIL/uL (ref 3.80–5.10)
RDW: 13.4 % (ref 11.0–15.0)
WBC: 6.1 10*3/uL (ref 3.8–10.8)

## 2015-10-10 LAB — COMPREHENSIVE METABOLIC PANEL
ALK PHOS: 53 U/L (ref 33–130)
ALT: 23 U/L (ref 6–29)
AST: 23 U/L (ref 10–35)
Albumin: 4.4 g/dL (ref 3.6–5.1)
BILIRUBIN TOTAL: 0.7 mg/dL (ref 0.2–1.2)
BUN: 14 mg/dL (ref 7–25)
CALCIUM: 9.3 mg/dL (ref 8.6–10.4)
CO2: 25 mmol/L (ref 20–31)
Chloride: 102 mmol/L (ref 98–110)
Creat: 0.67 mg/dL (ref 0.60–0.93)
Glucose, Bld: 105 mg/dL — ABNORMAL HIGH (ref 65–99)
POTASSIUM: 3.9 mmol/L (ref 3.5–5.3)
Sodium: 137 mmol/L (ref 135–146)
TOTAL PROTEIN: 6.7 g/dL (ref 6.1–8.1)

## 2015-10-10 LAB — TSH: TSH: 0.92 m[IU]/L

## 2015-10-10 NOTE — Progress Notes (Signed)
1.) Reason for visit: BP check  2.) Name of MD requesting visit: Dr. Tenny Crawoss  3.) H&P: BP was elevated at OV 10/05/15--156/74.    4.) ROS related to problem: patient brought her home cuff with her.  bp with that cuff was 169/82.  Checked manually BP was 170/82.  She brought a list of blood pressure reading from 3/31 through today, taken at different times of day.  The highest is 129/75.  The lowest is 122/62  All other readings are within 120s/60s-70s.  "at home I'm not nervous".  5.) Assessment and plan per MD: Will forward to Dr. Tenny Crawoss for review.  I instructed patient that per Dr. Charlott Rakesoss's OV note patient should continue to monitor BP at home and take list of readings to her PCP appointment in June.  Pt verbalizes understanding and states she usually does this.

## 2015-10-10 NOTE — Addendum Note (Signed)
Addended by: Tonita PhoenixBOWDEN, Bulmaro Feagans K on: 10/10/2015 09:12 AM   Modules accepted: Orders

## 2015-10-22 NOTE — Progress Notes (Signed)
Keep on same meds  ?

## 2015-11-13 ENCOUNTER — Other Ambulatory Visit: Payer: Self-pay

## 2015-11-13 MED ORDER — SYNTHROID 50 MCG PO TABS: ORAL_TABLET | ORAL | Status: DC

## 2015-11-13 MED ORDER — ATORVASTATIN CALCIUM 80 MG PO TABS: ORAL_TABLET | ORAL | Status: DC

## 2015-11-14 ENCOUNTER — Telehealth: Payer: Self-pay | Admitting: Internal Medicine

## 2015-11-14 ENCOUNTER — Telehealth: Payer: Self-pay | Admitting: *Deleted

## 2015-11-14 MED ORDER — SYNTHROID 50 MCG PO TABS: ORAL_TABLET | ORAL | Status: DC

## 2015-11-14 NOTE — Telephone Encounter (Signed)
Synthroid PA sent to [employer] [place] via Cover My Meds  ,   [facility]

## 2015-11-14 NOTE — Telephone Encounter (Signed)
Synthroid 50 mcg has been approved from 11-14-15 to 11-13-16

## 2016-03-03 ENCOUNTER — Other Ambulatory Visit: Payer: Self-pay | Admitting: Obstetrics & Gynecology

## 2016-03-03 ENCOUNTER — Other Ambulatory Visit: Payer: Self-pay | Admitting: Surgery

## 2016-03-03 DIAGNOSIS — Z1231 Encounter for screening mammogram for malignant neoplasm of breast: Secondary | ICD-10-CM

## 2016-03-13 ENCOUNTER — Ambulatory Visit
Admission: RE | Admit: 2016-03-13 | Discharge: 2016-03-13 | Disposition: A | Discharge status: Home / Self Care | Payer: Medicare Other | Source: Ambulatory Visit | Attending: Family Medicine | Admitting: Family Medicine

## 2016-03-13 DIAGNOSIS — Z1231 Encounter for screening mammogram for malignant neoplasm of breast: Secondary | ICD-10-CM | POA: Diagnosis not present

## 2016-03-18 ENCOUNTER — Encounter: Payer: Self-pay | Admitting: Obstetrics & Gynecology

## 2016-03-18 ENCOUNTER — Ambulatory Visit (INDEPENDENT_AMBULATORY_CARE_PROVIDER_SITE_OTHER): Payer: Medicare Other | Admitting: Obstetrics & Gynecology

## 2016-03-18 ENCOUNTER — Other Ambulatory Visit: Payer: Self-pay | Admitting: Obstetrics & Gynecology

## 2016-03-18 VITALS — BP 172/74 | HR 80 | Resp 16 | Ht 63.0 in | Wt 114.0 lb

## 2016-03-18 DIAGNOSIS — Z124 Encounter for screening for malignant neoplasm of cervix: Secondary | ICD-10-CM

## 2016-03-18 DIAGNOSIS — Z01419 Encounter for gynecological examination (general) (routine) without abnormal findings: Secondary | ICD-10-CM

## 2016-03-18 DIAGNOSIS — R928 Other abnormal and inconclusive findings on diagnostic imaging of breast: Secondary | ICD-10-CM

## 2016-03-18 LAB — POCT URINALYSIS DIPSTICK
Bilirubin, UA: NEGATIVE
Blood, UA: NEGATIVE
Glucose, UA: NEGATIVE
KETONES UA: NEGATIVE
Leukocytes, UA: NEGATIVE
Nitrite, UA: NEGATIVE
PH UA: 5
PROTEIN UA: NEGATIVE
UROBILINOGEN UA: NEGATIVE

## 2016-03-18 NOTE — Progress Notes (Signed)
81 y.o. G3P3 MarriedCaucasianF here for annual exam.  Doing well.  No vaginal bleeding.    Dr  Tenny Crawoss did blood work in April.  Carotid dopplers were done in march and were stable.    PCP:  Dr. Fabian SharpPanosh  Patient's last menstrual period was 07/07/1997.          Sexually active: Yes.    The current method of family planning is post menopausal status.    Exercising: Yes.     Smoker:  no  Health Maintenance: Pap:  11/04/2013 negative History of abnormal Pap:  no MMG:  03/14/16 follow up is needed Colonoscopy:  Never BMD:   2010 Osteoporosis TDaP:  2016 Hep C testing: not indicated Zostavax:  Completed Pneumonia vaccine:  completed Screening Labs: PCP, Hb today: PCP, Urine today: normal   reports that she quit smoking about 57 years ago. She has never used smokeless tobacco. She reports that she does not drink alcohol or use drugs.  Past Medical History:  Diagnosis Date  . CAD (coronary artery disease)    PCI to RCA and diagonal in remote past, residual 70% LAD  /   nuclear, 2007, no ischemia  . Carotid arterial disease (HCC)    Doppler, June, 2011, stable, 60-79% R. ICA, 40-59% LICA  . Contact lens/glasses fitting    wears contacts or glasses  . Dyslipidemia    Significant drop in LDL from Lipitor even though LDL remains high  . Ejection fraction    EF 65%, nuclear, 2007  . Fibrocystic breast   . HTN (hypertension)     no med  . Hypercholesterolemia   . Hypothyroidism    Thyroid surgery in the past, thyroid nodules followed by Dr.Ellison  . Lump or mass in breast 07/19/2007   Excised 01/12/13. B9 on pathQualifier: Diagnosis of  By: Fabian SharpPanosh MD, Neta MendsWanda K    . Osteoporosis   . Prominent abdominal aortic pulsation    No abdominal aneurysm by ultrasound  . Rectal fissure     Past Surgical History:  Procedure Laterality Date  . BREAST BIOPSY Right 01/12/2013   Procedure: Removal of right breast mass;  Surgeon: Currie Parishristian J Streck, MD;  Location: [facility];  Service:  General;  Laterality: Right;  . BREAST EXCISIONAL BIOPSY  9/14   scar tissue from the needle biopsy  . CORONARY ANGIOPLASTY  1993  . RECTOPERITONEAL FISTULA CLOSURE    . THYROID CYST EXCISION      Current Outpatient Prescriptions  Medication Sig Dispense Refill  . Ascorbic Acid (VITAMIN C) 100 MG tablet Take 100 mg by mouth daily.      Marland Kitchen. aspirin 81 MG tablet Take 81 mg by mouth daily.      Marland Kitchen. atorvastatin (LIPITOR) 80 MG tablet TAKE 1 BY MOUTH DAILY 90 tablet 3  . fish oil-omega-3 fatty acids 1000 MG capsule Take 2 g by mouth daily.      . MULTIPLE VITAMIN PO Take 1 tablet by mouth daily.     Marland Kitchen. SYNTHROID 50 MCG tablet TAKE 1 BY MOUTH DAILY 90 tablet 3   No current facility-administered medications for this visit.     Family History  Problem Relation Age of Onset  . Heart attack Father   . Osteoporosis Mother     ROS:  Pertinent items are noted in HPI.  Otherwise, a comprehensive ROS was negative.  Exam:   BP (!) 172/74 (BP Location: Right Arm, Patient Position: Sitting, Cuff Size: Normal)   Pulse 80  Resp 16   Ht 5\' 3"  (1.6 m)   Wt 114 lb (51.7 kg)   LMP 07/07/1997   BMI 20.19 kg/m   Weight change: +4#   Height: 5\' 3"  (160 cm)  Ht Readings from Last 3 Encounters:  03/18/16 5\' 3"  (1.6 m)  10/05/15 5\' 3"  (1.6 m)  12/21/14 5' 2.5" (1.588 m)   General appearance: alert, cooperative and appears stated age Head: Normocephalic, without obvious abnormality, atraumatic Neck: no adenopathy, supple, symmetrical, trachea midline and thyroid normal to inspection and palpation Lungs: clear to auscultation bilaterally Breasts: multiple cyst feeling lesions in bilateral breasts.  This is a stable exam from prior years. Heart: regular rate and rhythm Abdomen: soft, non-tender; bowel sounds normal; no masses,  no organomegaly Extremities: extremities normal, atraumatic, no cyanosis or edema Skin: Skin color, texture, turgor normal. No rashes or lesions Lymph nodes: Cervical,  supraclavicular, and axillary nodes normal. No abnormal inguinal nodes palpated Neurologic: Grossly normal   Pelvic: External genitalia:  no lesions              Urethra:  normal appearing urethra with no masses, tenderness or lesions              Bartholins and Skenes: normal                 Vagina: normal appearing vagina with normal color and discharge, no lesions              Cervix: no lesions              Pap taken: Yes.   Bimanual Exam:  Uterus:  normal              Adnexa: normal adnexa and no mass, fullness, tenderness               Rectovaginal: Confirms               Anus:  normal sphincter tone, no lesions  Chaperone was present for exam.  A:              Well Woman with normal exam PMP, no HRT Hypothyroidism Osteoporosis.  Declines additional BMDs. H/O carotic plaques and h/o hyperlipidemia  P:      Mammogram guidelines reviewed.  Pt is continuing yearly MMG due hx of prior complex cysts and due to difficulty with clinical breast exam. pap smear obtained Lab work done typically with Dr. Fabian Sharp and now with Dr. Tenny Craw. Continues to decline with screening colonoscopy, stool tests for blood, and BMD.   return annually or prn

## 2016-03-19 LAB — IPS PAP SMEAR ONLY

## 2016-03-21 ENCOUNTER — Ambulatory Visit
Admission: RE | Admit: 2016-03-21 | Discharge: 2016-03-21 | Disposition: A | Discharge status: Home / Self Care | Payer: Medicare Other | Source: Ambulatory Visit | Attending: Obstetrics & Gynecology | Admitting: Obstetrics & Gynecology

## 2016-03-21 DIAGNOSIS — N63 Unspecified lump in breast: Secondary | ICD-10-CM | POA: Diagnosis not present

## 2016-03-21 DIAGNOSIS — R928 Other abnormal and inconclusive findings on diagnostic imaging of breast: Secondary | ICD-10-CM

## 2016-03-21 DIAGNOSIS — N6002 Solitary cyst of left breast: Secondary | ICD-10-CM | POA: Diagnosis not present

## 2016-06-02 ENCOUNTER — Ambulatory Visit (INDEPENDENT_AMBULATORY_CARE_PROVIDER_SITE_OTHER): Payer: Medicare Other | Admitting: Family Medicine

## 2016-06-02 DIAGNOSIS — Z23 Encounter for immunization: Secondary | ICD-10-CM | POA: Diagnosis not present

## 2016-09-19 NOTE — Progress Notes (Signed)
No chief complaint on file.   HPI: Natasha Harper 82 y.o. comes in today for Preventive Medicare  visit  And med management .Since last visit. No major health change  Sees card yearly  Dr Waunita Schooner Following breast area .  Has wc ht  Monitor at home 125  Range and  Monitor has been checked in office  .    Thyroid taking med daily  LIPIDS      On high dose  Med    Vascular disease  On asa no bleeding   Health Maintenance  Topic Date Due  . DEXA SCAN  09/22/2017 (Originally 04/21/2002)  . TETANUS/TDAP  12/19/2024  . INFLUENZA VACCINE  Completed  . PNA vac Low Risk Adult  Completed   Health Maintenance Review LIFESTYLE:  TAD red wine 3 x per week  Sugar beverages:n Sleep:ok  walks exercise every day weather permitting    Hearing: some dec   Vision:  No limitations at present . Last eye check UTD  Safety:  Has smoke detector and wears seat belts.  No firearms. No excess sun exposure. Sees dentist regularly.  Falls: n  Memory: Felt to be ok   , no concern from her or her family.  Depression: No anhedonia unusual crying or depressive symptoms  Nutrition: Eats well balanced diet; adequate calcium and vitamin D. No swallowing chewing problems.  Injury: no major injuries in the last six months.  Other healthcare providers:  Reviewed today .  Social:  Lives with spouse married. No pets.   Preventive parameters: up-to-date  Reviewed   ADLS:   There are no problems or need for assistance  driving, feeding, obtaining food, dressing, toileting and bathing, managing money using phone. She is independent.    ROS:  GEN/ HEENT: No fever, significant weight changes sweats headaches vision problems hearing changes, CV/ PULM; No chest pain shortness of breath cough, syncope,edema  change in exercise tolerance. GI /GU: No adominal pain, vomiting, change in bowel habits. No blood in the stool. No significant GU symptoms. SKIN/HEME: ,no acute skin rashes suspicious  lesions or bleeding. No lymphadenopathy, nodules, masses.  NEURO/ PSYCH:  No neurologic signs such as weakness numbness. No depression anxiety. IMM/ Allergy: No unusual infections.  Allergy .   REST of 12 system review negative except as per HPI   Past Medical History:  Diagnosis Date  . CAD (coronary artery disease)    PCI to RCA and diagonal in remote past, residual 70% LAD  /   nuclear, 2007, no ischemia  . Carotid arterial disease (HCC)    Doppler, June, 2011, stable, 62-69% R. ICA, 48-54% LICA  . Contact lens/glasses fitting    wears contacts or glasses  . Dyslipidemia    Significant drop in LDL from Lipitor even though LDL remains high  . Ejection fraction    EF 65%, nuclear, 2007  . Fibrocystic breast   . HTN (hypertension)     no med  . Hypercholesterolemia   . Hypothyroidism    Thyroid surgery in the past, thyroid nodules followed by Dr.Ellison  . Lump or mass in breast 07/19/2007   Excised 01/12/13. B9 on pathQualifier: Diagnosis of  By: Regis Bill MD, Standley Brooking    . Osteoporosis   . Prominent abdominal aortic pulsation    No abdominal aneurysm by ultrasound  . Rectal fissure     Family History  Problem Relation Age of Onset  . Heart attack Father   . Osteoporosis Mother  Social History   Social History  . Marital status: Married    Spouse name: N/A  . Number of children: N/A  . Years of education: N/A   Social History Main Topics  . Smoking status: Former Smoker    Quit date: 07/07/1958  . Smokeless tobacco: Never Used  . Alcohol use No  . Drug use: No  . Sexual activity: Yes    Partners: Male   Other Topics Concern  . None   Social History Narrative   HH     Of 2  No pets    Family from [place] every day at least 30 minutes  2 miles .   Sleep  Ok 10 - 7    Just moved to condo      Outpatient Encounter Prescriptions as of 09/22/2016  Medication Sig  . Ascorbic Acid (VITAMIN C) 100 MG tablet Take 100 mg by mouth daily.      Marland Kitchen aspirin 81 MG tablet Take 81 mg by mouth daily.    Marland Kitchen atorvastatin (LIPITOR) 80 MG tablet TAKE 1 BY MOUTH DAILY  . fish oil-omega-3 fatty acids 1000 MG capsule Take 2 g by mouth daily.    . MULTIPLE VITAMIN PO Take 1 tablet by mouth daily.   Marland Kitchen SYNTHROID 50 MCG tablet TAKE 1 BY MOUTH DAILY   No facility-administered encounter medications on file as of 09/22/2016.     EXAM:  BP (!) 182/80 (BP Location: Right Arm, Patient Position: Sitting, Cuff Size: Normal)   Pulse 92   Temp 98.2 F (36.8 C) (Oral)   Ht 5' 2.75" (1.594 m)   Wt 116 lb (52.6 kg)   LMP 07/07/1997   BMI 20.71 kg/m   Body mass index is 20.71 kg/m. bp r and left 170/80 168/80 Physical Exam: Vital signs reviewed seems mildy hard of hearing  Nl affect  IPJ:ASNK is a well-developed well-nourished alert cooperative   who appears stated age in no acute distress.  HEENT: normocephalic atraumatic , Eyes: PERRL EOM's full, conjunctiva clear, Nares: paten,t no deformity discharge or tenderness., Ears: no deformity EAC's clear TMs with normal landmarks. Mouth: clear OP, no lesions, edema.  Moist mucous membranes. Dentition in adequate repair. NECK: supple without masses, thyromegaly or bruits. CHEST/PULM:  Clear to auscultation and percussion breath sounds equal no wheeze , rales or rhonchi. No chest wall deformities or tenderness. CV: PMI is nondisplaced, S1 S2 no gallops, murmurs, rubs. Peripheral pulses are full .No JVD .  Breast exam tender no obv masses  ABDOMEN: Bowel sounds normal nontender  No guard or rebound, no hepato splenomegal no CVA tenderness.  Mid abd bruit heard  But pulses present    Extremtities:  No clubbing cyanosis or edema, no acute joint swelling or redness no focal atrophy vv djd changes no ulcers swelling  NEURO:  Oriented x3, cranial nerves 3-12 appear to be intact, no obvious focal weakness,gait within normal limits no abnormal reflexes or asymmetrical SKIN: No acute rashes normal turgor, color, no  bruising or petechiae. Age changes  PSYCH: Oriented, good eye contact, no obvious depression anxiety, cognition and judgment appear normal. LN: no cervical axillary i adenopathy No noted deficits in memory, attention, and speech.    Wt Readings from Last 3 Encounters:  09/22/16 116 lb (52.6 kg)  03/18/16 114 lb (51.7 kg)  10/05/15 114 lb 12.8 oz (52.1 kg)   Wt Readings from Last 3 Encounters:  09/22/16 116 lb (52.6 kg)  03/18/16 114 lb (51.7 kg)  10/05/15 114 lb 12.8 oz (52.1 kg)    ASSESSMENT AND PLAN:  Discussed the following assessment and plan:  Visit for preventive health examination - Plan: TSH, Lipid panel, Hepatic function panel, Hemoglobin F7T, Basic metabolic panel, CBC with Differential/Platelet  HYPERGLYCEMIA, FASTING - Plan: TSH, Lipid panel, Hepatic function panel, Hemoglobin K2I, Basic metabolic panel, CBC with Differential/Platelet  HYPERTENSION, WHITE COAT - Plan: TSH, Lipid panel, Hepatic function panel, Hemoglobin O9B, Basic metabolic panel, CBC with Differential/Platelet  Dyslipidemia - Plan: TSH, Lipid panel, Hepatic function panel, Hemoglobin D5H, Basic metabolic panel, CBC with Differential/Platelet  Hypothyroidism, unspecified type - Plan: TSH, Lipid panel  Patient Care Team: Burnis Medin, MD as PCP - General Megan Salon, MD (Gynecology) Fay Records, MD as Consulting Physician (Cardiology) Rutherford Guys, MD as Consulting Physician (Ophthalmology)  Patient Instructions  Glad  You are doing well.  consider getting hearing checked  Audiology   Will notify you  of labs when available.   Consider getting bone density to check risk for fracture  .  Please check your blood pressure readings twice a day for a week at home to make sure they are in range. If not then contact us or Dr. Harrington Challenger for follow-up. Blood pressure 120/80 is much better.   Bone Health Bones protect organs, store calcium, and anchor muscles. Good health habits, such as eating  nutritious foods and exercising regularly, are important for maintaining healthy bones. They can also help to prevent a condition that causes bones to lose density and become weak and brittle (osteoporosis). Why is bone mass important? Bone mass refers to the amount of bone tissue that you have. The higher your bone mass, the stronger your bones. An important step toward having healthy bones throughout life is to have strong and dense bones during childhood. A young adult who has a high bone mass is more likely to have a high bone mass later in life. Bone mass at its greatest it is called peak bone mass. A large decline in bone mass occurs in older adults. In women, it occurs about the time of menopause. During this time, it is important to practice good health habits, because if more bone is lost than what is replaced, the bones will become less healthy and more likely to break (fracture). If you find that you have a low bone mass, you may be able to prevent osteoporosis or further bone loss by changing your diet and lifestyle. How can I find out if my bone mass is low? Bone mass can be measured with an X-ray test that is called a bone mineral density (BMD) test. This test is recommended for all women who are age 65 or older. It may also be recommended for men who are age 56 or older, or for people who are more likely to develop osteoporosis due to:  Having bones that break easily.  Having a long-term disease that weakens bones, such as kidney disease or rheumatoid arthritis.  Having menopause earlier than normal.  Taking medicine that weakens bones, such as steroids, thyroid hormones, or hormone treatment for breast cancer or prostate cancer.  Smoking.  Drinking three or more alcoholic drinks each day. What are the nutritional recommendations for healthy bones? To have healthy bones, you need to get enough of the right minerals and vitamins. Most nutrition experts recommend getting these  nutrients from the foods that you eat. Nutritional recommendations vary from person to person. Ask your  health care provider what is healthy for you. Here are some general guidelines. Calcium Recommendations  Calcium is the most important (essential) mineral for bone health. Most people can get enough calcium from their diet, but supplements may be recommended for people who are at risk for osteoporosis. Good sources of calcium include:  Dairy products, such as low-fat or nonfat milk, cheese, and yogurt.  Dark green leafy vegetables, such as bok choy and broccoli.  Calcium-fortified foods, such as orange juice, cereal, bread, soy beverages, and tofu products.  Nuts, such as almonds. Follow these recommended amounts for daily calcium intake:  Children, age 193?3: 700 mg.  Children, age 19?8: 1,000 mg.  Children, age 52?13: 1,300 mg.  Teens, age 80?18: 1,300 mg.  Adults, age 76?50: 1,000 mg.  Adults, age 29?70:  Men: 1,000 mg.  Women: 1,200 mg.  Adults, age 199 or older: 1,200 mg.  Pregnant and breastfeeding females:  Teens: 1,300 mg.  Adults: 1,000 mg. Vitamin D Recommendations  Vitamin D is the most essential vitamin for bone health. It helps the body to absorb calcium. Sunlight stimulates the skin to make vitamin D, so be sure to get enough sunlight. If you live in a cold climate or you do not get outside often, your health care provider may recommend that you take vitamin D supplements. Good sources of vitamin D in your diet include:  Egg yolks.  Saltwater fish.  Milk and cereal fortified with vitamin D. Follow these recommended amounts for daily vitamin D intake:  Children and teens, age 72?18: 26 international units.  Adults, age 81 or younger: 400-800 international units.  Adults, age 80 or older: 800-1,000 international units. Other Nutrients  Other nutrients for bone health include:  Phosphorus. This mineral is found in meat, poultry, dairy foods, nuts, and  legumes. The recommended daily intake for adult men and adult women is 700 mg.  Magnesium. This mineral is found in seeds, nuts, dark green vegetables, and legumes. The recommended daily intake for adult men is 400?420 mg. For adult women, it is 310?320 mg.  Vitamin K. This vitamin is found in green leafy vegetables. The recommended daily intake is 120 mg for adult men and 90 mg for adult women. What type of physical activity is best for building and maintaining healthy bones? Weight-bearing and strength-building activities are important for building and maintaining peak bone mass. Weight-bearing activities cause muscles and bones to work against gravity. Strength-building activities increases muscle strength that supports bones. Weight-bearing and muscle-building activities include:  Walking and hiking.  Jogging and running.  Dancing.  Gym exercises.  Lifting weights.  Tennis and racquetball.  Climbing stairs.  Aerobics. Adults should get at least 30 minutes of moderate physical activity on most days. Children should get at least 60 minutes of moderate physical activity on most days. Ask your health care provide what type of exercise is best for you. Where can I find more information? For more information, check out the following websites:  [place]: [employer]se  [employer] of Health: http://www.niams.AnonymousEar.fr.asp This information is not intended to replace advice given to you by your health care provider. Make sure you discuss any questions you have with your health care provider. Document Released: 09/13/2003 Document Revised: 01/11/2016 Document Reviewed: 06/28/2014 Elsevier Interactive Patient Education  2017 [employer] Maintenance, Female Adopting a healthy lifestyle and getting preventive care can go a long way to promote health and wellness. Talk with your health  care provider about what  schedule of regular examinations is right for you. This is a good chance for you to check in with your provider about disease prevention and staying healthy. In between checkups, there are plenty of things you can do on your own. Experts have done a lot of research about which lifestyle changes and preventive measures are most likely to keep you healthy. Ask your health care provider for more information. Weight and diet Eat a healthy diet  Be sure to include plenty of vegetables, fruits, low-fat dairy products, and lean protein.  Do not eat a lot of foods high in solid fats, added sugars, or salt.  Get regular exercise. This is one of the most important things you can do for your health.  Most adults should exercise for at least 150 minutes each week. The exercise should increase your heart rate and make you sweat (moderate-intensity exercise).  Most adults should also do strengthening exercises at least twice a week. This is in addition to the moderate-intensity exercise. Maintain a healthy weight  Body mass index (BMI) is a measurement that can be used to identify possible weight problems. It estimates body fat based on height and weight. Your health care provider can help determine your BMI and help you achieve or maintain a healthy weight.  For females 54 years of age and older:  A BMI below 18.5 is considered underweight.  A BMI of 18.5 to 24.9 is normal.  A BMI of 25 to 29.9 is considered overweight.  A BMI of 30 and above is considered obese. Watch levels of cholesterol and blood lipids  You should start having your blood tested for lipids and cholesterol at 80 years of age, then have this test every 5 years.  You may need to have your cholesterol levels checked more often if:  Your lipid or cholesterol levels are high.  You are older than 80 years of age.  You are at high risk for heart disease. Cancer screening Lung Cancer  Lung cancer  screening is recommended for adults 81-66 years old who are at high risk for lung cancer because of a history of smoking.  A yearly low-dose CT scan of the lungs is recommended for people who:  Currently smoke.  Have quit within the past 15 years.  Have at least a 30-pack-year history of smoking. A pack year is smoking an average of one pack of cigarettes a day for 1 year.  Yearly screening should continue until it has been 15 years since you quit.  Yearly screening should stop if you develop a health problem that would prevent you from having lung cancer treatment. Breast Cancer  Practice breast self-awareness. This means understanding how your breasts normally appear and feel.  It also means doing regular breast self-exams. Let your health care provider know about any changes, no matter how small.  If you are in your 20s or 30s, you should have a clinical breast exam (CBE) by a health care provider every 1-3 years as part of a regular health exam.  If you are 9 or older, have a CBE every year. Also consider having a breast X-ray (mammogram) every year.  If you have a family history of breast cancer, talk to your health care provider about genetic screening.  If you are at high risk for breast cancer, talk to your health care provider about having an MRI and a mammogram every year.  Breast cancer gene (BRCA) assessment is recommended for women who have family members with  BRCA-related cancers. BRCA-related cancers include:  Breast.  Ovarian.  Tubal.  Peritoneal cancers.  Results of the assessment will determine the need for genetic counseling and BRCA1 and BRCA2 testing. Cervical Cancer  Your health care provider may recommend that you be screened regularly for cancer of the pelvic organs (ovaries, uterus, and vagina). This screening involves a pelvic examination, including checking for microscopic changes to the surface of your cervix (Pap test). You may be encouraged to  have this screening done every 3 years, beginning at age 2.  For women ages 23-65, health care providers may recommend pelvic exams and Pap testing every 3 years, or they may recommend the Pap and pelvic exam, combined with testing for human papilloma virus (HPV), every 5 years. Some types of HPV increase your risk of cervical cancer. Testing for HPV may also be done on women of any age with unclear Pap test results.  Other health care providers may not recommend any screening for nonpregnant women who are considered low risk for pelvic cancer and who do not have symptoms. Ask your health care provider if a screening pelvic exam is right for you.  If you have had past treatment for cervical cancer or a condition that could lead to cancer, you need Pap tests and screening for cancer for at least 20 years after your treatment. If Pap tests have been discontinued, your risk factors (such as having a new sexual partner) need to be reassessed to determine if screening should resume. Some women have medical problems that increase the chance of getting cervical cancer. In these cases, your health care provider may recommend more frequent screening and Pap tests. Colorectal Cancer  This type of cancer can be detected and often prevented.  Routine colorectal cancer screening usually begins at 80 years of age and continues through 80 years of age.  Your health care provider may recommend screening at an earlier age if you have risk factors for colon cancer.  Your health care provider may also recommend using home test kits to check for hidden blood in the stool.  A small camera at the end of a tube can be used to examine your colon directly (sigmoidoscopy or colonoscopy). This is done to check for the earliest forms of colorectal cancer.  Routine screening usually begins at age 11.  Direct examination of the colon should be repeated every 5-10 years through 80 years of age. However, you may need to be  screened more often if early forms of precancerous polyps or small growths are found. Skin Cancer  Check your skin from head to toe regularly.  Tell your health care provider about any new moles or changes in moles, especially if there is a change in a mole's shape or color.  Also tell your health care provider if you have a mole that is larger than the size of a pencil eraser.  Always use sunscreen. Apply sunscreen liberally and repeatedly throughout the day.  Protect yourself by wearing long sleeves, pants, a wide-brimmed hat, and sunglasses whenever you are outside. Heart disease, diabetes, and high blood pressure  High blood pressure causes heart disease and increases the risk of stroke. High blood pressure is more likely to develop in:  People who have blood pressure in the high end of the normal range (130-139/85-89 mm Hg).  People who are overweight or obese.  People who are African American.  If you are 66-30 years of age, have your blood pressure checked every 3-5 years. If  you are 16 years of age or older, have your blood pressure checked every year. You should have your blood pressure measured twice-once when you are at a hospital or clinic, and once when you are not at a hospital or clinic. Record the average of the two measurements. To check your blood pressure when you are not at a hospital or clinic, you can use:  An automated blood pressure machine at a pharmacy.  A home blood pressure monitor.  If you are between 42 years and 8 years old, ask your health care provider if you should take aspirin to prevent strokes.  Have regular diabetes screenings. This involves taking a blood sample to check your fasting blood sugar level.  If you are at a normal weight and have a low risk for diabetes, have this test once every three years after 80 years of age.  If you are overweight and have a high risk for diabetes, consider being tested at a younger age or more  often. Preventing infection Hepatitis B  If you have a higher risk for hepatitis B, you should be screened for this virus. You are considered at high risk for hepatitis B if:  You were born in a country where hepatitis B is common. Ask your health care provider which countries are considered high risk.  Your parents were born in a high-risk country, and you have not been immunized against hepatitis B (hepatitis B vaccine).  You have HIV or AIDS.  You use needles to inject street drugs.  You live with someone who has hepatitis B.  You have had sex with someone who has hepatitis B.  You get hemodialysis treatment.  You take certain medicines for conditions, including cancer, organ transplantation, and autoimmune conditions. Hepatitis C  Blood testing is recommended for:  Everyone born from [birth year] through 1963.  Anyone with known risk factors for hepatitis C. Sexually transmitted infections (STIs)  You should be screened for sexually transmitted infections (STIs) including gonorrhea and chlamydia if:  You are sexually active and are younger than 80 years of age.  You are older than 80 years of age and your health care provider tells you that you are at risk for this type of infection.  Your sexual activity has changed since you were last screened and you are at an increased risk for chlamydia or gonorrhea. Ask your health care provider if you are at risk.  If you do not have HIV, but are at risk, it may be recommended that you take a prescription medicine daily to prevent HIV infection. This is called pre-exposure prophylaxis (PrEP). You are considered at risk if:  You are sexually active and do not regularly use condoms or know the HIV status of your partner(s).  You take drugs by injection.  You are sexually active with a partner who has HIV. Talk with your health care provider about whether you are at high risk of being infected with HIV. If you choose to begin PrEP, you  should first be tested for HIV. You should then be tested every 3 months for as long as you are taking PrEP. Pregnancy  If you are premenopausal and you may become pregnant, ask your health care provider about preconception counseling.  If you may become pregnant, take 400 to 800 micrograms (mcg) of folic acid every day.  If you want to prevent pregnancy, talk to your health care provider about birth control (contraception). Osteoporosis and menopause  Osteoporosis is a disease in which  the bones lose minerals and strength with aging. This can result in serious bone fractures. Your risk for osteoporosis can be identified using a bone density scan.  If you are 4 years of age or older, or if you are at risk for osteoporosis and fractures, ask your health care provider if you should be screened.  Ask your health care provider whether you should take a calcium or vitamin D supplement to lower your risk for osteoporosis.  Menopause may have certain physical symptoms and risks.  Hormone replacement therapy may reduce some of these symptoms and risks. Talk to your health care provider about whether hormone replacement therapy is right for you. Follow these instructions at home:  Schedule regular health, dental, and eye exams.  Stay current with your immunizations.  Do not use any tobacco products including cigarettes, chewing tobacco, or electronic cigarettes.  If you are pregnant, do not drink alcohol.  If you are breastfeeding, limit how much and how often you drink alcohol.  Limit alcohol intake to no more than 1 drink per day for nonpregnant women. One drink equals 12 ounces of beer, 5 ounces of wine, or 1 ounces of hard liquor.  Do not use street drugs.  Do not share needles.  Ask your health care provider for help if you need support or information about quitting drugs.  Tell your health care provider if you often feel depressed.  Tell your health care provider if you have  ever been abused or do not feel safe at home. This information is not intended to replace advice given to you by your health care provider. Make sure you discuss any questions you have with your health care provider. Document Released: 01/06/2011 Document Revised: 11/29/2015 Document Reviewed: 03/27/2015 Elsevier Interactive Patient Education  2017 [place]. Mykiah Schmuck M.D.

## 2016-09-22 ENCOUNTER — Ambulatory Visit (INDEPENDENT_AMBULATORY_CARE_PROVIDER_SITE_OTHER): Payer: Medicare Other | Admitting: Internal Medicine

## 2016-09-22 ENCOUNTER — Encounter: Payer: Self-pay | Admitting: Internal Medicine

## 2016-09-22 VITALS — BP 182/80 | HR 92 | Temp 98.2°F | Ht 62.75 in | Wt 116.0 lb

## 2016-09-22 DIAGNOSIS — E039 Hypothyroidism, unspecified: Secondary | ICD-10-CM

## 2016-09-22 DIAGNOSIS — R7309 Other abnormal glucose: Secondary | ICD-10-CM | POA: Diagnosis not present

## 2016-09-22 DIAGNOSIS — Z0000 Encounter for general adult medical examination without abnormal findings: Secondary | ICD-10-CM | POA: Diagnosis not present

## 2016-09-22 DIAGNOSIS — R030 Elevated blood-pressure reading, without diagnosis of hypertension: Secondary | ICD-10-CM | POA: Diagnosis not present

## 2016-09-22 DIAGNOSIS — E785 Hyperlipidemia, unspecified: Secondary | ICD-10-CM

## 2016-09-22 LAB — HEMOGLOBIN A1C: Hgb A1c MFr Bld: 5.6 % (ref 4.6–6.5)

## 2016-09-22 LAB — HEPATIC FUNCTION PANEL
ALBUMIN: 4.3 g/dL (ref 3.5–5.2)
ALK PHOS: 57 U/L (ref 39–117)
ALT: 24 U/L (ref 0–35)
AST: 24 U/L (ref 0–37)
BILIRUBIN DIRECT: 0.1 mg/dL (ref 0.0–0.3)
TOTAL PROTEIN: 6.7 g/dL (ref 6.0–8.3)
Total Bilirubin: 0.7 mg/dL (ref 0.2–1.2)

## 2016-09-22 LAB — BASIC METABOLIC PANEL
BUN: 15 mg/dL (ref 6–23)
CHLORIDE: 102 meq/L (ref 96–112)
CO2: 27 mEq/L (ref 19–32)
Calcium: 9.6 mg/dL (ref 8.4–10.5)
Creatinine, Ser: 0.5 mg/dL (ref 0.40–1.20)
GFR: 126.36 mL/min (ref >60.00)
GLUCOSE: 103 mg/dL — AB (ref 70–99)
POTASSIUM: 4.3 meq/L (ref 3.5–5.1)
SODIUM: 139 meq/L (ref 135–145)

## 2016-09-22 LAB — LIPID PANEL
CHOL/HDL RATIO: 4
Cholesterol: 204 mg/dL — ABNORMAL HIGH (ref 0–200)
HDL: 55.5 mg/dL (ref >39.00)
LDL CALC: 136 mg/dL — AB (ref 0–99)
NONHDL: 148.88
TRIGLYCERIDES: 62 mg/dL (ref 0.0–149.0)
VLDL: 12.4 mg/dL (ref 0.0–40.0)

## 2016-09-22 LAB — CBC WITH DIFFERENTIAL/PLATELET
BASOS ABS: 0 10*3/uL (ref 0.0–0.1)
Basophils Relative: 0.3 % (ref 0.0–3.0)
EOS ABS: 0 10*3/uL (ref 0.0–0.7)
Eosinophils Relative: 0.1 % (ref 0.0–5.0)
HEMATOCRIT: 39.3 % (ref 36.0–46.0)
Hemoglobin: 13.4 g/dL (ref 12.0–15.0)
LYMPHS PCT: 16.5 % (ref 12.0–46.0)
Lymphs Abs: 0.8 10*3/uL (ref 0.7–4.0)
MCHC: 34 g/dL (ref 30.0–36.0)
MCV: 101.7 fl — AB (ref 78.0–100.0)
Monocytes Absolute: 0.3 10*3/uL (ref 0.1–1.0)
Monocytes Relative: 6.3 % (ref 3.0–12.0)
NEUTROS ABS: 3.6 10*3/uL (ref 1.4–7.7)
Neutrophils Relative %: 76.8 % (ref 43.0–77.0)
PLATELETS: 260 10*3/uL (ref 150.0–400.0)
RBC: 3.87 Mil/uL (ref 3.87–5.11)
RDW: 12.8 % (ref 11.5–15.5)
WBC: 4.7 10*3/uL (ref 4.0–10.5)

## 2016-09-22 LAB — TSH: TSH: 0.65 u[IU]/mL (ref 0.35–4.50)

## 2016-09-22 NOTE — Patient Instructions (Addendum)
Glad  You are doing well.  consider getting hearing checked  Audiology   Will notify you  of labs when available.   Consider getting bone density to check risk for fracture  .  Please check your blood pressure readings twice a day for a week at home to make sure they are in range. If not then contact us or Dr. Harrington Challenger for follow-up. Blood pressure 120/80 is much better.   Bone Health Bones protect organs, store calcium, and anchor muscles. Good health habits, such as eating nutritious foods and exercising regularly, are important for maintaining healthy bones. They can also help to prevent a condition that causes bones to lose density and become weak and brittle (osteoporosis). Why is bone mass important? Bone mass refers to the amount of bone tissue that you have. The higher your bone mass, the stronger your bones. An important step toward having healthy bones throughout life is to have strong and dense bones during childhood. A young adult who has a high bone mass is more likely to have a high bone mass later in life. Bone mass at its greatest it is called peak bone mass. A large decline in bone mass occurs in older adults. In women, it occurs about the time of menopause. During this time, it is important to practice good health habits, because if more bone is lost than what is replaced, the bones will become less healthy and more likely to break (fracture). If you find that you have a low bone mass, you may be able to prevent osteoporosis or further bone loss by changing your diet and lifestyle. How can I find out if my bone mass is low? Bone mass can be measured with an X-ray test that is called a bone mineral density (BMD) test. This test is recommended for all women who are age 50 or older. It may also be recommended for men who are age 65 or older, or for people who are more likely to develop osteoporosis due to:  Having bones that break easily.  Having a long-term disease that weakens bones,  such as kidney disease or rheumatoid arthritis.  Having menopause earlier than normal.  Taking medicine that weakens bones, such as steroids, thyroid hormones, or hormone treatment for breast cancer or prostate cancer.  Smoking.  Drinking three or more alcoholic drinks each day. What are the nutritional recommendations for healthy bones? To have healthy bones, you need to get enough of the right minerals and vitamins. Most nutrition experts recommend getting these nutrients from the foods that you eat. Nutritional recommendations vary from person to person. Ask your health care provider what is healthy for you. Here are some general guidelines. Calcium Recommendations  Calcium is the most important (essential) mineral for bone health. Most people can get enough calcium from their diet, but supplements may be recommended for people who are at risk for osteoporosis. Good sources of calcium include:  Dairy products, such as low-fat or nonfat milk, cheese, and yogurt.  Dark green leafy vegetables, such as bok choy and broccoli.  Calcium-fortified foods, such as orange juice, cereal, bread, soy beverages, and tofu products.  Nuts, such as almonds. Follow these recommended amounts for daily calcium intake:  Children, age 63?3: 700 mg.  Children, age 82?8: 1,000 mg.  Children, age 59?13: 1,300 mg.  Teens, age 82?18: 1,300 mg.  Adults, age 80?50: 1,000 mg.  Adults, age 31?70:  Men: 1,000 mg.  Women: 1,200 mg.  Adults, age 829 or older:  1,200 mg.  Pregnant and breastfeeding females:  Teens: 1,300 mg.  Adults: 1,000 mg. Vitamin D Recommendations  Vitamin D is the most essential vitamin for bone health. It helps the body to absorb calcium. Sunlight stimulates the skin to make vitamin D, so be sure to get enough sunlight. If you live in a cold climate or you do not get outside often, your health care provider may recommend that you take vitamin D supplements. Good sources of vitamin D  in your diet include:  Egg yolks.  Saltwater fish.  Milk and cereal fortified with vitamin D. Follow these recommended amounts for daily vitamin D intake:  Children and teens, age 20?18: 73 international units.  Adults, age 36 or younger: 400-800 international units.  Adults, age 65 or older: 800-1,000 international units. Other Nutrients  Other nutrients for bone health include:  Phosphorus. This mineral is found in meat, poultry, dairy foods, nuts, and legumes. The recommended daily intake for adult men and adult women is 700 mg.  Magnesium. This mineral is found in seeds, nuts, dark green vegetables, and legumes. The recommended daily intake for adult men is 400?420 mg. For adult women, it is 310?320 mg.  Vitamin K. This vitamin is found in green leafy vegetables. The recommended daily intake is 120 mg for adult men and 90 mg for adult women. What type of physical activity is best for building and maintaining healthy bones? Weight-bearing and strength-building activities are important for building and maintaining peak bone mass. Weight-bearing activities cause muscles and bones to work against gravity. Strength-building activities increases muscle strength that supports bones. Weight-bearing and muscle-building activities include:  Walking and hiking.  Jogging and running.  Dancing.  Gym exercises.  Lifting weights.  Tennis and racquetball.  Climbing stairs.  Aerobics. Adults should get at least 30 minutes of moderate physical activity on most days. Children should get at least 60 minutes of moderate physical activity on most days. Ask your health care provide what type of exercise is best for you. Where can I find more information? For more information, check out the following websites:  [place]: YardHomes.se  [employer] of Health: http://www.niams.AnonymousEar.fr.asp This  information is not intended to replace advice given to you by your health care provider. Make sure you discuss any questions you have with your health care provider. Document Released: 09/13/2003 Document Revised: 01/11/2016 Document Reviewed: 06/28/2014 Elsevier Interactive Patient Education  2017 [place] Maintenance, Female Adopting a healthy lifestyle and getting preventive care can go a long way to promote health and wellness. Talk with your health care provider about what schedule of regular examinations is right for you. This is a good chance for you to check in with your provider about disease prevention and staying healthy. In between checkups, there are plenty of things you can do on your own. Experts have done a lot of research about which lifestyle changes and preventive measures are most likely to keep you healthy. Ask your health care provider for more information. Weight and diet Eat a healthy diet  Be sure to include plenty of vegetables, fruits, low-fat dairy products, and lean protein.  Do not eat a lot of foods high in solid fats, added sugars, or salt.  Get regular exercise. This is one of the most important things you can do for your health.  Most adults should exercise for at least 150 minutes each week. The exercise should increase your heart rate and make you sweat (moderate-intensity exercise).  Most adults should also do strengthening exercises at least twice a week. This is in addition to the moderate-intensity exercise. Maintain a healthy weight  Body mass index (BMI) is a measurement that can be used to identify possible weight problems. It estimates body fat based on height and weight. Your health care provider can help determine your BMI and help you achieve or maintain a healthy weight.  For females 9 years of age and older:  A BMI below 18.5 is considered underweight.  A BMI of 18.5 to 24.9 is normal.  A BMI of 25 to 29.9 is considered  overweight.  A BMI of 30 and above is considered obese. Watch levels of cholesterol and blood lipids  You should start having your blood tested for lipids and cholesterol at 80 years of age, then have this test every 5 years.  You may need to have your cholesterol levels checked more often if:  Your lipid or cholesterol levels are high.  You are older than 80 years of age.  You are at high risk for heart disease. Cancer screening Lung Cancer  Lung cancer screening is recommended for adults 30-77 years old who are at high risk for lung cancer because of a history of smoking.  A yearly low-dose CT scan of the lungs is recommended for people who:  Currently smoke.  Have quit within the past 15 years.  Have at least a 30-pack-year history of smoking. A pack year is smoking an average of one pack of cigarettes a day for 1 year.  Yearly screening should continue until it has been 15 years since you quit.  Yearly screening should stop if you develop a health problem that would prevent you from having lung cancer treatment. Breast Cancer  Practice breast self-awareness. This means understanding how your breasts normally appear and feel.  It also means doing regular breast self-exams. Let your health care provider know about any changes, no matter how small.  If you are in your 20s or 30s, you should have a clinical breast exam (CBE) by a health care provider every 1-3 years as part of a regular health exam.  If you are 12 or older, have a CBE every year. Also consider having a breast X-ray (mammogram) every year.  If you have a family history of breast cancer, talk to your health care provider about genetic screening.  If you are at high risk for breast cancer, talk to your health care provider about having an MRI and a mammogram every year.  Breast cancer gene (BRCA) assessment is recommended for women who have family members with BRCA-related cancers. BRCA-related cancers  include:  Breast.  Ovarian.  Tubal.  Peritoneal cancers.  Results of the assessment will determine the need for genetic counseling and BRCA1 and BRCA2 testing. Cervical Cancer  Your health care provider may recommend that you be screened regularly for cancer of the pelvic organs (ovaries, uterus, and vagina). This screening involves a pelvic examination, including checking for microscopic changes to the surface of your cervix (Pap test). You may be encouraged to have this screening done every 3 years, beginning at age 42.  For women ages 55-65, health care providers may recommend pelvic exams and Pap testing every 3 years, or they may recommend the Pap and pelvic exam, combined with testing for human papilloma virus (HPV), every 5 years. Some types of HPV increase your risk of cervical cancer. Testing for HPV may also be done on women of any age with  unclear Pap test results.  Other health care providers may not recommend any screening for nonpregnant women who are considered low risk for pelvic cancer and who do not have symptoms. Ask your health care provider if a screening pelvic exam is right for you.  If you have had past treatment for cervical cancer or a condition that could lead to cancer, you need Pap tests and screening for cancer for at least 20 years after your treatment. If Pap tests have been discontinued, your risk factors (such as having a new sexual partner) need to be reassessed to determine if screening should resume. Some women have medical problems that increase the chance of getting cervical cancer. In these cases, your health care provider may recommend more frequent screening and Pap tests. Colorectal Cancer  This type of cancer can be detected and often prevented.  Routine colorectal cancer screening usually begins at 80 years of age and continues through 80 years of age.  Your health care provider may recommend screening at an earlier age if you have risk factors  for colon cancer.  Your health care provider may also recommend using home test kits to check for hidden blood in the stool.  A small camera at the end of a tube can be used to examine your colon directly (sigmoidoscopy or colonoscopy). This is done to check for the earliest forms of colorectal cancer.  Routine screening usually begins at age 26.  Direct examination of the colon should be repeated every 5-10 years through 80 years of age. However, you may need to be screened more often if early forms of precancerous polyps or small growths are found. Skin Cancer  Check your skin from head to toe regularly.  Tell your health care provider about any new moles or changes in moles, especially if there is a change in a mole's shape or color.  Also tell your health care provider if you have a mole that is larger than the size of a pencil eraser.  Always use sunscreen. Apply sunscreen liberally and repeatedly throughout the day.  Protect yourself by wearing long sleeves, pants, a wide-brimmed hat, and sunglasses whenever you are outside. Heart disease, diabetes, and high blood pressure  High blood pressure causes heart disease and increases the risk of stroke. High blood pressure is more likely to develop in:  People who have blood pressure in the high end of the normal range (130-139/85-89 mm Hg).  People who are overweight or obese.  People who are African American.  If you are 11-21 years of age, have your blood pressure checked every 3-5 years. If you are 16 years of age or older, have your blood pressure checked every year. You should have your blood pressure measured twice-once when you are at a hospital or clinic, and once when you are not at a hospital or clinic. Record the average of the two measurements. To check your blood pressure when you are not at a hospital or clinic, you can use:  An automated blood pressure machine at a pharmacy.  A home blood pressure monitor.  If you  are between 52 years and 10 years old, ask your health care provider if you should take aspirin to prevent strokes.  Have regular diabetes screenings. This involves taking a blood sample to check your fasting blood sugar level.  If you are at a normal weight and have a low risk for diabetes, have this test once every three years after 80 years of age.  If you are overweight and have a high risk for diabetes, consider being tested at a younger age or more often. Preventing infection Hepatitis B  If you have a higher risk for hepatitis B, you should be screened for this virus. You are considered at high risk for hepatitis B if:  You were born in a country where hepatitis B is common. Ask your health care provider which countries are considered high risk.  Your parents were born in a high-risk country, and you have not been immunized against hepatitis B (hepatitis B vaccine).  You have HIV or AIDS.  You use needles to inject street drugs.  You live with someone who has hepatitis B.  You have had sex with someone who has hepatitis B.  You get hemodialysis treatment.  You take certain medicines for conditions, including cancer, organ transplantation, and autoimmune conditions. Hepatitis C  Blood testing is recommended for:  Everyone born from [birth year] through 1963.  Anyone with known risk factors for hepatitis C. Sexually transmitted infections (STIs)  You should be screened for sexually transmitted infections (STIs) including gonorrhea and chlamydia if:  You are sexually active and are younger than 80 years of age.  You are older than 80 years of age and your health care provider tells you that you are at risk for this type of infection.  Your sexual activity has changed since you were last screened and you are at an increased risk for chlamydia or gonorrhea. Ask your health care provider if you are at risk.  If you do not have HIV, but are at risk, it may be recommended that you  take a prescription medicine daily to prevent HIV infection. This is called pre-exposure prophylaxis (PrEP). You are considered at risk if:  You are sexually active and do not regularly use condoms or know the HIV status of your partner(s).  You take drugs by injection.  You are sexually active with a partner who has HIV. Talk with your health care provider about whether you are at high risk of being infected with HIV. If you choose to begin PrEP, you should first be tested for HIV. You should then be tested every 3 months for as long as you are taking PrEP. Pregnancy  If you are premenopausal and you may become pregnant, ask your health care provider about preconception counseling.  If you may become pregnant, take 400 to 800 micrograms (mcg) of folic acid every day.  If you want to prevent pregnancy, talk to your health care provider about birth control (contraception). Osteoporosis and menopause  Osteoporosis is a disease in which the bones lose minerals and strength with aging. This can result in serious bone fractures. Your risk for osteoporosis can be identified using a bone density scan.  If you are 58 years of age or older, or if you are at risk for osteoporosis and fractures, ask your health care provider if you should be screened.  Ask your health care provider whether you should take a calcium or vitamin D supplement to lower your risk for osteoporosis.  Menopause may have certain physical symptoms and risks.  Hormone replacement therapy may reduce some of these symptoms and risks. Talk to your health care provider about whether hormone replacement therapy is right for you. Follow these instructions at home:  Schedule regular health, dental, and eye exams.  Stay current with your immunizations.  Do not use any tobacco products including cigarettes, chewing tobacco, or electronic cigarettes.  If you are  pregnant, do not drink alcohol.  If you are breastfeeding, limit  how much and how often you drink alcohol.  Limit alcohol intake to no more than 1 drink per day for nonpregnant women. One drink equals 12 ounces of beer, 5 ounces of wine, or 1 ounces of hard liquor.  Do not use street drugs.  Do not share needles.  Ask your health care provider for help if you need support or information about quitting drugs.  Tell your health care provider if you often feel depressed.  Tell your health care provider if you have ever been abused or do not feel safe at home. This information is not intended to replace advice given to you by your health care provider. Make sure you discuss any questions you have with your health care provider. Document Released: 01/06/2011 Document Revised: 11/29/2015 Document Reviewed: 03/27/2015 Elsevier Interactive Patient Education  2017 [employer].

## 2016-10-02 NOTE — Progress Notes (Signed)
Cardiology Office Note   Date:  10/03/2016   ID:  Natasha Harper, DOB [birth year], MRN [id number]  PCP:  Lorretta Harp, MD  Cardiologist:   Dietrich Pates, MD   F?U of CAD      History of Present Illness: Natasha Harper is a 82 y.o. female with a history of CAD  Remote intervention in 1993  Stress test in 2013  Aslo a nistory of mod CV dz, HL  I saw him in March 2017  No SOB   BP at home 118 to 120  Then 120 to 124    Pt says she walks  Plans to do more with weathier improving   Denoes CP     No outpatient prescriptions have been marked as taking for the 10/03/16 encounter (Office Visit) with Pricilla Riffle, MD.     Allergies:   Actonel [risedronate sodium]; Evista [raloxifene]; and Fosamax [alendronate sodium]   Past Medical History:  Diagnosis Date  . CAD (coronary artery disease)    PCI to RCA and diagonal in remote past, residual 70% LAD  /   nuclear, 2007, no ischemia  . Carotid arterial disease (HCC)    Doppler, June, 2011, stable, 60-79% R. ICA, 40-59% LICA  . Contact lens/glasses fitting    wears contacts or glasses  . Dyslipidemia    Significant drop in LDL from Lipitor even though LDL remains high  . Ejection fraction    EF 65%, nuclear, 2007  . Fibrocystic breast   . HTN (hypertension)     no med  . Hypercholesterolemia   . Hypothyroidism    Thyroid surgery in the past, thyroid nodules followed by Dr.Ellison  . Lump or mass in breast 07/19/2007   Excised 01/12/13. B9 on pathQualifier: Diagnosis of  By: Fabian Sharp MD, Neta Mends    . Osteoporosis   . Prominent abdominal aortic pulsation    No abdominal aneurysm by ultrasound  . Rectal fissure     Past Surgical History:  Procedure Laterality Date  . BREAST BIOPSY Right 01/12/2013   Procedure: Removal of right breast mass;  Surgeon: Currie Paris, MD;  Location: [facility];  Service: General;  Laterality: Right;  . BREAST EXCISIONAL BIOPSY  9/14   scar tissue from the needle biopsy  .  CORONARY ANGIOPLASTY  1993  . RECTOPERITONEAL FISTULA CLOSURE    . THYROID CYST EXCISION       Social History:  The patient  reports that she quit smoking about 58 years ago. She has never used smokeless tobacco. She reports that she does not drink alcohol or use drugs.   Family History:  The patient's family history includes Heart attack in her father; Osteoporosis in her mother.    ROS:  Please see the history of present illness. All other systems are reviewed and  Negative to the above problem except as noted.    PHYSICAL EXAM: VS:  BP (!) 180/90   Pulse 84   Ht 5' 2.75" (1.594 m)   Wt 115 lb 3.2 oz (52.3 kg)   LMP 07/07/1997   BMI 20.57 kg/m   GEN: Well nourished, well developed, in no acute distress  HEENT: normal  Neck: no JVD, Bruit over R carotid  NO masses Cardiac: RRR; no murmurs, rubs, or gallops,no edema  Respiratory:  clear to auscultation bilaterally, normal work of breathing GI: soft, nontender, nondistended, + BS  No hepatomegaly  MS: no deformity Moving all extremities   Skin: warm  and dry, no rash Neuro:  Strength and sensation are intact Psych: euthymic mood, full affect   EKG:  EKG is not  ordered today.   Lipid Panel    Component Value Date/Time   CHOL 204 (H) 09/22/2016 1059   TRIG 62.0 09/22/2016 1059   HDL 55.50 09/22/2016 1059   CHOLHDL 4 09/22/2016 1059   VLDL 12.4 09/22/2016 1059   LDLCALC 136 (H) 09/22/2016 1059   LDLDIRECT 152.1 01/16/2010 1015      Wt Readings from Last 3 Encounters:  10/03/16 115 lb 3.2 oz (52.3 kg)  09/22/16 116 lb (52.6 kg)  03/18/16 114 lb (51.7 kg)      ASSESSMENT AND PLAN:  1  HTN  BP is high  She says she takes at home  110s to 120s  Says she has brought cuff to clinic and it has been OK    2  CAD  No symtpoms to sugg angina    3  CV dz    FU USN next week  4  HL  LDL is 137  Not optimal  disccussed with pt  She refuses to add med or change meds  Says she will work on diet  F?U in 1 year     Current medicines are reviewed at length with the patient today.  The patient does not have concerns regarding medicines.  Signed, Dietrich PatesPaula Miraya Cudney, MD  10/03/2016 10:54 AM    [facility] [address], [place], [place]  [address] Phone: [phone]; Fax: [phone]

## 2016-10-03 ENCOUNTER — Ambulatory Visit (INDEPENDENT_AMBULATORY_CARE_PROVIDER_SITE_OTHER): Payer: Medicare Other | Admitting: Internal Medicine

## 2016-10-03 ENCOUNTER — Encounter: Payer: Self-pay | Admitting: Internal Medicine

## 2016-10-03 VITALS — BP 180/90 | HR 84 | Ht 62.75 in | Wt 115.2 lb

## 2016-10-03 DIAGNOSIS — I2510 Atherosclerotic heart disease of native coronary artery without angina pectoris: Secondary | ICD-10-CM | POA: Diagnosis not present

## 2016-10-03 NOTE — Patient Instructions (Signed)
Your physician wants you to follow-up in: ONE YEAR WITH DR ROSS You will receive a reminder letter in the mail two months in advance. If you don't receive a letter, please call our office to schedule the follow-up appointment.   If you need a refill on your cardiac medications before your next appointment, please call your pharmacy.  

## 2016-10-20 ENCOUNTER — Ambulatory Visit (HOSPITAL_COMMUNITY)
Admission: RE | Admit: 2016-10-20 | Discharge: 2016-10-20 | Disposition: A | Discharge status: Home / Self Care | Payer: Medicare Other | Source: Ambulatory Visit | Attending: Cardiology | Admitting: Cardiology

## 2016-10-20 ENCOUNTER — Other Ambulatory Visit: Payer: Self-pay | Admitting: Internal Medicine

## 2016-10-20 DIAGNOSIS — I10 Essential (primary) hypertension: Secondary | ICD-10-CM | POA: Insufficient documentation

## 2016-10-20 DIAGNOSIS — I6523 Occlusion and stenosis of bilateral carotid arteries: Secondary | ICD-10-CM

## 2016-10-20 DIAGNOSIS — Z87891 Personal history of nicotine dependence: Secondary | ICD-10-CM | POA: Diagnosis not present

## 2016-10-20 DIAGNOSIS — I2510 Atherosclerotic heart disease of native coronary artery without angina pectoris: Secondary | ICD-10-CM | POA: Diagnosis not present

## 2016-10-28 ENCOUNTER — Other Ambulatory Visit: Payer: Self-pay | Admitting: Emergency Medicine

## 2016-10-28 MED ORDER — SYNTHROID 50 MCG PO TABS: ORAL_TABLET | ORAL | 3 refills | Status: DC

## 2016-12-02 ENCOUNTER — Other Ambulatory Visit: Payer: Self-pay | Admitting: Family Medicine

## 2016-12-02 MED ORDER — ATORVASTATIN CALCIUM 80 MG PO TABS: ORAL_TABLET | ORAL | 2 refills | Status: DC

## 2016-12-02 NOTE — Telephone Encounter (Signed)
Sent to the pharmacy by e-scribe.  Pt had yearly 09/22/16 and asked to return in 1 year.

## 2017-01-16 ENCOUNTER — Telehealth: Payer: Self-pay

## 2017-01-16 NOTE — Telephone Encounter (Signed)
Received PA request for Synthroid. PA submitted & pending. Key: [id number]

## 2017-01-19 NOTE — Telephone Encounter (Signed)
PA approved, form faxed back to pharmacy. 

## 2017-03-27 ENCOUNTER — Encounter: Payer: Self-pay | Admitting: Internal Medicine

## 2017-04-30 ENCOUNTER — Other Ambulatory Visit: Payer: Self-pay | Admitting: Obstetrics & Gynecology

## 2017-04-30 ENCOUNTER — Ambulatory Visit (INDEPENDENT_AMBULATORY_CARE_PROVIDER_SITE_OTHER): Payer: Medicare Other

## 2017-04-30 DIAGNOSIS — Z23 Encounter for immunization: Secondary | ICD-10-CM | POA: Diagnosis not present

## 2017-04-30 DIAGNOSIS — Z1231 Encounter for screening mammogram for malignant neoplasm of breast: Secondary | ICD-10-CM

## 2017-05-21 ENCOUNTER — Ambulatory Visit
Admission: RE | Admit: 2017-05-21 | Discharge: 2017-05-21 | Disposition: A | Discharge status: Home / Self Care | Payer: Medicare Other | Source: Ambulatory Visit | Attending: Obstetrics & Gynecology | Admitting: Obstetrics & Gynecology

## 2017-05-21 DIAGNOSIS — Z1231 Encounter for screening mammogram for malignant neoplasm of breast: Secondary | ICD-10-CM | POA: Diagnosis not present

## 2017-07-16 ENCOUNTER — Encounter: Payer: Self-pay | Admitting: Obstetrics & Gynecology

## 2017-07-16 ENCOUNTER — Ambulatory Visit (INDEPENDENT_AMBULATORY_CARE_PROVIDER_SITE_OTHER): Payer: Medicare Other | Admitting: Obstetrics & Gynecology

## 2017-07-16 ENCOUNTER — Other Ambulatory Visit: Payer: Self-pay

## 2017-07-16 VITALS — BP 156/82 | HR 100 | Resp 14 | Ht 62.5 in | Wt 115.2 lb

## 2017-07-16 DIAGNOSIS — Z01419 Encounter for gynecological examination (general) (routine) without abnormal findings: Secondary | ICD-10-CM | POA: Diagnosis not present

## 2017-07-16 NOTE — Progress Notes (Signed)
83 y.o. G3P3 MarriedCaucasianF here for annual exam.  Doing well.  Had a nice holiday.  Denies vaginal bleeding.    Patient's last menstrual period was 07/07/1997.          Sexually active: Yes.    The current method of family planning is post menopausal status.    Exercising: Yes.    walking Smoker:  Former smoker   Health Maintenance: Pap:  03/18/16 Neg   11/04/13 Neg  History of abnormal Pap:  no MMG:  05/21/17 BIRADS1:neg   Colonoscopy:  never BMD:   2010 osteoporosis  TDaP:  2016  Pneumonia vaccine(s):  2015 Shingrix:   2012 Hep C testing: n/a Screening Labs: PCP, Hb today: PCP, Urine today: not collected    reports that she quit smoking about 59 years ago. she has never used smokeless tobacco. She reports that she does not drink alcohol or use drugs.  Past Medical History:  Diagnosis Date  . CAD (coronary artery disease)    PCI to RCA and diagonal in remote past, residual 70% LAD  /   nuclear, 2007, no ischemia  . Carotid arterial disease (HCC)    Doppler, June, 2011, stable, 60-79% R. ICA, 40-59% LICA  . Contact lens/glasses fitting    wears contacts or glasses  . Dyslipidemia    Significant drop in LDL from Lipitor even though LDL remains high  . Ejection fraction    EF 65%, nuclear, 2007  . Fibrocystic breast   . HTN (hypertension)     no med  . Hypercholesterolemia   . Hypothyroidism    Thyroid surgery in the past, thyroid nodules followed by Dr.Ellison  . Lump or mass in breast 07/19/2007   Excised 01/12/13. B9 on pathQualifier: Diagnosis of  By: Fabian Sharp MD, Neta Mends    . Osteoporosis   . Prominent abdominal aortic pulsation    No abdominal aneurysm by ultrasound  . Rectal fissure     Past Surgical History:  Procedure Laterality Date  . BREAST BIOPSY Right 01/12/2013   Procedure: Removal of right breast mass;  Surgeon: Currie Paris, MD;  Location: [facility];  Service: General;  Laterality: Right;  . BREAST EXCISIONAL BIOPSY  9/14   scar  tissue from the needle biopsy  . CORONARY ANGIOPLASTY  1993  . RECTOPERITONEAL FISTULA CLOSURE    . THYROID CYST EXCISION      Current Outpatient Medications  Medication Sig Dispense Refill  . Ascorbic Acid (VITAMIN C) 100 MG tablet Take 100 mg by mouth daily.      Marland Kitchen aspirin 81 MG tablet Take 81 mg by mouth daily.      Marland Kitchen atorvastatin (LIPITOR) 80 MG tablet TAKE 1 BY MOUTH DAILY 90 tablet 2  . fish oil-omega-3 fatty acids 1000 MG capsule Take 2 g by mouth daily.      . MULTIPLE VITAMIN PO Take 1 tablet by mouth daily.     Marland Kitchen SYNTHROID 50 MCG tablet TAKE 1 BY MOUTH DAILY 90 tablet 3   No current facility-administered medications for this visit.     Family History  Problem Relation Age of Onset  . Heart attack Father   . Osteoporosis Mother     ROS:  Pertinent items are noted in HPI.  Otherwise, a comprehensive ROS was negative.  Exam:   BP (!) 156/82 (BP Location: Right Arm, Patient Position: Sitting, Cuff Size: Normal)   Pulse 100   Resp 14   Ht 5' 2.5" (1.588 m)  Wt 115 lb 4 oz (52.3 kg)   LMP 07/07/1997   BMI 20.74 kg/m    Height: 5' 2.5" (158.8 cm)  Ht Readings from Last 3 Encounters:  07/16/17 5' 2.5" (1.588 m)  10/03/16 5' 2.75" (1.594 m)  09/22/16 5' 2.75" (1.594 m)    General appearance: alert, cooperative and appears stated age Head: Normocephalic, without obvious abnormality, atraumatic Neck: no adenopathy, supple, symmetrical, trachea midline and thyroid normal to inspection and palpation Lungs: clear to auscultation bilaterally Breasts: normal appearance, no masses or tenderness,  Right bbreast mass Heart: regular rate and rhythm Abdomen: soft, non-tender; bowel sounds normal; no masses,  no organomegaly Extremities: extremities normal, atraumatic, no cyanosis or edema Skin: Skin color, texture, turgor normal. No rashes or lesions Lymph nodes: Cervical, supraclavicular, and axillary nodes normal. No abnormal inguinal nodes palpated Neurologic: Grossly  normal   Pelvic: External genitalia:  no lesions              Urethra:  normal appearing urethra with no masses, tenderness or lesions              Bartholins and Skenes: normal                 Vagina: normal appearing vagina with normal color and discharge, no lesions              Cervix: no lesions              Pap taken: No. Bimanual Exam:  Uterus:  normal size, contour, position, consistency, mobility, non-tender              Adnexa: normal adnexa and no mass, fullness, tenderness               Rectovaginal: Confirms               Anus:  normal sphincter tone, no lesions  Chaperone was present for exam.  A:  Well Woman with normal exam PMP, no HRT Thyroid nodule, on medication for suppression Osteoporosis.  Has declined additional BMDs. H/o carotic plaques and elevated lipids.  Followed by Dr. Tenny Crawoss.   Elevated blood pressure, also noted on AEX with Dr. Fabian SharpPanosh.  Pt aware.  Will follow up with Dr. Fabian SharpPanosh.  Has appt upcoming.  P:   Mammogram guidelines reviewed pap smear not indicated Continues to decline colonoscopy.  Cologuard discussed.  Declines as well. BMD discussed.  Declines. Lab work done with Dr. Fabian SharpPanosh 3/18. return annually or prn

## 2017-08-05 ENCOUNTER — Other Ambulatory Visit: Payer: Self-pay

## 2017-08-05 MED ORDER — SYNTHROID 50 MCG PO TABS: ORAL_TABLET | ORAL | 3 refills | Status: DC

## 2017-08-27 ENCOUNTER — Telehealth: Payer: Self-pay | Admitting: Internal Medicine

## 2017-08-27 ENCOUNTER — Other Ambulatory Visit: Payer: Self-pay

## 2017-08-27 MED ORDER — ATORVASTATIN CALCIUM 80 MG PO TABS: ORAL_TABLET | ORAL | 0 refills | Status: DC

## 2017-08-27 NOTE — Telephone Encounter (Signed)
Copied from CRM [id number]. Topic: Quick Communication - Rx Refill/Question >> Aug 27, 2017 12:47 PM Natasha Harper, Natasha Harper wrote: Medication: atorvastatin (LIPITOR) 80 MG tablet   Has the patient contacted their pharmacy? Yes.  Pharmacy says that they have reached out to the provider but no answer   (Agent: If no, request that the patient contact the pharmacy for the refill.)   Preferred Pharmacy (with phone number or street name): [address] - [place], [place] - [id number] [phone] (Phone) [phone] (Fax)     Agent: Please be advised that RX refills may take up to 3 business days. We ask that you follow-up with your pharmacy.

## 2017-10-13 ENCOUNTER — Other Ambulatory Visit: Payer: Self-pay | Admitting: Internal Medicine

## 2017-10-13 DIAGNOSIS — I6523 Occlusion and stenosis of bilateral carotid arteries: Secondary | ICD-10-CM

## 2017-10-22 ENCOUNTER — Ambulatory Visit (HOSPITAL_COMMUNITY)
Admission: RE | Admit: 2017-10-22 | Discharge: 2017-10-22 | Disposition: A | Discharge status: Home / Self Care | Payer: Medicare Other | Source: Ambulatory Visit | Attending: Cardiovascular Disease | Admitting: Cardiovascular Disease

## 2017-10-22 DIAGNOSIS — I6523 Occlusion and stenosis of bilateral carotid arteries: Secondary | ICD-10-CM | POA: Insufficient documentation

## 2017-11-20 ENCOUNTER — Other Ambulatory Visit: Payer: Self-pay | Admitting: Internal Medicine

## 2017-11-24 NOTE — Progress Notes (Signed)
Chief Complaint  Patient presents with  . Annual Exam    No new concerns    HPI: Natasha Harper 83 y.o. comes in today for Preventive Medicare exam/visit .Since last visit.   Doing ok has seen gyne and to see dr Harrington Challenger next week   mammo has breast lumps   About the same  bp 129 range at home    .    Highest  131  Always goes up in office and in past  bp monitor correlated  Hx for since seeing dr Ron Parker.   Taking thyroid med daily  Lipids   Taking  For vascular disease no sx .  Has decline bone  Further bone building  Therapy see past se    Health Maintenance  Topic Date Due  . DEXA SCAN  04/21/2002  . INFLUENZA VACCINE  02/04/2018  . TETANUS/TDAP  12/19/2024  . PNA vac Low Risk Adult  Completed   Health Maintenance Review LIFESTYLE:  Exercise:   Walk  2 miles per day .   Tobacco/ETS: no Alcohol:   Red wine couple per week  Sugar beverages: no Sleep: at least  7  Drug use: no HH: 2  No pets  [place] died    Hearing:  Pretty good   Vision:  No limitations at present . Last eye check UTD  contact .     Safety:  Has smoke detector and wears seat belts.  No firearms. No excess sun exposure. Sees dentist regular  Memory: Felt to be good  , no concern from her or her family.  Depression: No anhedonia unusual crying or depressive symptoms  Nutrition: Eats well balanced diet; adequate calcium and vitamin D. No swallowing chewing problems.  Injury: no major injuries in the last six months.  Other healthcare providers:  Reviewed today .  Social:  Lives with spouse married. No pets.   Preventive parameters: up-to-date  Reviewed   ADLS:   There are no problems or need for assistance  driving, feeding, obtaining food, dressing, toileting and bathing, managing money using phone. She is independent.  Daughter breast cancer  Post rx  Doing better   ROS:  GEN/ HEENT: No fever, significant weight changes sweats headaches vision problems hearing changes, CV/ PULM; No  chest pain shortness of breath cough, syncope,edema  change in exercise tolerance. GI /GU: No adominal pain, vomiting, change in bowel habits. No blood in the stool. No significant GU symptoms. SKIN/HEME: ,no acute skin rashes suspicious lesions or bleeding. No lymphadenopathy, nodules, masses.  NEURO/ PSYCH:  No neurologic signs such as weakness numbness. No depression anxiety. IMM/ Allergy: No unusual infections.  Allergy .   REST of 12 system review negative except as per HPI   Past Medical History:  Diagnosis Date  . CAD (coronary artery disease)    PCI to RCA and diagonal in remote past, residual 70% LAD  /   nuclear, 2007, no ischemia  . Carotid arterial disease (HCC)    Doppler, June, 2011, stable, 21-30% R. ICA, 86-57% LICA  . Contact lens/glasses fitting    wears contacts or glasses  . Dyslipidemia    Significant drop in LDL from Lipitor even though LDL remains high  . Ejection fraction    EF 65%, nuclear, 2007  . Fibrocystic breast   . HTN (hypertension)     no med  . Hypercholesterolemia   . Hypothyroidism    Thyroid surgery in the past, thyroid nodules followed by Dr.Ellison  .  Lump or mass in breast 07/19/2007   Excised 01/12/13. B9 on pathQualifier: Diagnosis of  By: Regis Bill MD, Standley Brooking    . Osteoporosis   . Prominent abdominal aortic pulsation    No abdominal aneurysm by ultrasound  . Rectal fissure     Family History  Problem Relation Age of Onset  . Heart attack Father   . Osteoporosis Mother   . Breast cancer Daughter     Social History   Socioeconomic History  . Marital status: Married    Spouse name: Not on file  . Number of children: Not on file  . Years of education: Not on file  . Highest education level: Not on file  Occupational History  . Not on file  Social Needs  . Financial resource strain: Not on file  . Food insecurity:    Worry: Not on file    Inability: Not on file  . Transportation needs:    Medical: Not on file    Non-medical:  Not on file  Tobacco Use  . Smoking status: Former Smoker    Last attempt to quit: 07/07/1958    Years since quitting: 59.4  . Smokeless tobacco: Never Used  Substance and Sexual Activity  . Alcohol use: No  . Drug use: No  . Sexual activity: Yes    Partners: Male  Lifestyle  . Physical activity:    Days per week: Not on file    Minutes per session: Not on file  . Stress: Not on file  Relationships  . Social connections:    Talks on phone: Not on file    Gets together: Not on file    Attends religious service: Not on file    Active member of club or organization: Not on file    Attends meetings of clubs or organizations: Not on file    Relationship status: Not on file  Other Topics Concern  . Not on file  Social History Narrative   HH     Of 2  No pets    Family from [place] every day at least 30 minutes  2 miles .   Sleep  Ok 10 - 7    Just moved to condo      Outpatient Encounter Medications as of 11/25/2017  Medication Sig  . Ascorbic Acid (VITAMIN C) 100 MG tablet Take 100 mg by mouth daily.    Marland Kitchen aspirin 81 MG tablet Take 81 mg by mouth daily.    Marland Kitchen atorvastatin (LIPITOR) 80 MG tablet TAKE 1 TABLET BY MOUTH DAILY. PATIENT NEEDS APPOINTMENT FOR FUTHER REFILLS.  . fish oil-omega-3 fatty acids 1000 MG capsule Take 2 g by mouth daily.    . MULTIPLE VITAMIN PO Take 1 tablet by mouth daily.   Marland Kitchen SYNTHROID 50 MCG tablet TAKE 1 BY MOUTH DAILY   No facility-administered encounter medications on file as of 11/25/2017.     EXAM:  BP (!) 162/80 (BP Location: Right Arm, Patient Position: Sitting, Cuff Size: Normal)   Pulse (!) 101   Temp 98.1 F (36.7 C) (Oral)   Ht 5' 2.5" (1.588 m)   Wt 115 lb 1.6 oz (52.2 kg)   LMP 07/07/1997   BMI 20.72 kg/m   Body mass index is 20.72 kg/m.  Physical Exam: Vital signs reviewed AJO:INOM is a well-developed well-nourished alert cooperative   who appears stated age in no acute distress.  HEENT: normocephalic  atraumatic , Eyes: PERRL EOM's  full, conjunctiva clear, Nares: paten,t no deformity discharge or tenderness., Ears: no deformity EAC's clear TMs with normal landmarks. Mouth: clear OP, no lesions, edema.  Moist mucous membranes. Dentition in adequate repair. NECK: supple without masses, thyromegaly or bruits. CHEST/PULM:  Clear to auscultation and percussion breath sounds equal no wheeze , rales or rhonchi. Mild kyphosis . Breast  Right with 2 cysts mibile  nontender 1-2 cm smooth no axilla changes  CV: PMI is nondisplaced, S1 S2 no gallops, murmurs, rubs. Peripheral pulses are full without delay.No JVD .   Varicosities superfical legs no ulcers  ABDOMEN: Bowel sounds normal nontender  No guard or rebound, no hepato splenomegal no CVA tenderness.   Extremtities:  No clubbing cyanosis or edema, no acute joint swelling or redness no focal atrophy NEURO:  Oriented x3, cranial nerves 3-12 appear to be intact, no obvious focal weakness,gait within normal limits no abnormal reflexes or asymmetrical SKIN: No acute rashes normal turgor, color, no bruising or petechiae. PSYCH: Oriented, good eye contact,  cognition and judgment appear normal. mildy anxious  Nl speech  LN: no cervical axillary inguinal adenopathy No noted deficits in memory, attention, and speech.   Lab Results  Component Value Date   WBC 4.7 09/22/2016   HGB 13.4 09/22/2016   HCT 39.3 09/22/2016   PLT 260.0 09/22/2016   GLUCOSE 103 (H) 09/22/2016   CHOL 204 (H) 09/22/2016   TRIG 62.0 09/22/2016   HDL 55.50 09/22/2016   LDLDIRECT 152.1 01/16/2010   LDLCALC 136 (H) 09/22/2016   ALT 24 09/22/2016   AST 24 09/22/2016   NA 139 09/22/2016   K 4.3 09/22/2016   CL 102 09/22/2016   CREATININE 0.50 09/22/2016   BUN 15 09/22/2016   CO2 27 09/22/2016   TSH 0.65 09/22/2016   HGBA1C 5.6 09/22/2016    ASSESSMENT AND PLAN:  Discussed the following assessment and plan:  Visit for preventive health examination  Medication management  - Plan: CBC with Differential/Platelet, Basic metabolic panel, Hepatic function panel, Hemoglobin A1c, Lipid panel, TSH  HYPERTENSION, WHITE COAT - contineu to monitor nl readings at home and  bring in monitor  to  yearly or every other year visit s - Plan: CBC with Differential/Platelet, Basic metabolic panel, Hepatic function panel, Hemoglobin A1c, Lipid panel, TSH  Hypothyroidism, unspecified type - Plan: CBC with Differential/Platelet, Basic metabolic panel, Hepatic function panel, Hemoglobin A1c, Lipid panel, TSH  HYPERGLYCEMIA, FASTING - Plan: CBC with Differential/Platelet, Basic metabolic panel, Hepatic function panel, Hemoglobin A1c, Lipid panel, TSH  Dyslipidemia - Plan: CBC with Differential/Platelet, Basic metabolic panel, Hepatic function panel, Hemoglobin A1c, Lipid panel, TSH  Bilateral carotid artery disease, unspecified type ([place]) - Plan: CBC with Differential/Platelet, Basic metabolic panel, Hepatic function panel, Hemoglobin A1c, Lipid panel, TSH  Osteoporosis without current pathological fracture, unspecified osteoporosis type - [past hx of se of meds  should reconsider  check into pro[facility]   Multiple benign lumps of breast - being followed   Patient Care Team: Burnis Medin, MD as PCP - General Megan Salon, MD (Gynecology) Fay Records, MD as Consulting Physician (Cardiology) Rutherford Guys, MD as Consulting Physician (Ophthalmology)  Patient Instructions  Please continue to monitor BP readings and  Take in your monitor when you go to see dr Harrington Challenger to make sure still in range .    Will notify you  of labs when available.  malke sure you are getting vit D .    Health Maintenance, Female Adopting a healthy lifestyle  and getting preventive care can go a long way to promote health and wellness. Talk with your health care provider about what schedule of regular examinations is right for you. This is a good chance for you to check in with your provider about disease  prevention and staying healthy. In between checkups, there are plenty of things you can do on your own. Experts have done a lot of research about which lifestyle changes and preventive measures are most likely to keep you healthy. Ask your health care provider for more information. Weight and diet Eat a healthy diet  Be sure to include plenty of vegetables, fruits, low-fat dairy products, and lean protein.  Do not eat a lot of foods high in solid fats, added sugars, or salt.  Get regular exercise. This is one of the most important things you can do for your health. ? Most adults should exercise for at least 150 minutes each week. The exercise should increase your heart rate and make you sweat (moderate-intensity exercise). ? Most adults should also do strengthening exercises at least twice a week. This is in addition to the moderate-intensity exercise.  Maintain a healthy weight  Body mass index (BMI) is a measurement that can be used to identify possible weight problems. It estimates body fat based on height and weight. Your health care provider can help determine your BMI and help you achieve or maintain a healthy weight.  For females 20 years of age and older: ? A BMI below 18.5 is considered underweight. ? A BMI of 18.5 to 24.9 is normal. ? A BMI of 25 to 29.9 is considered overweight. ? A BMI of 30 and above is considered obese.  Watch levels of cholesterol and blood lipids  You should start having your blood tested for lipids and cholesterol at 81 years of age, then have this test every 5 years.  You may need to have your cholesterol levels checked more often if: ? Your lipid or cholesterol levels are high. ? You are older than 81 years of age. ? You are at high risk for heart disease.  Cancer screening Lung Cancer  Lung cancer screening is recommended for adults 73-41 years old who are at high risk for lung cancer because of a history of smoking.  A yearly low-dose CT scan  of the lungs is recommended for people who: ? Currently smoke. ? Have quit within the past 15 years. ? Have at least a 30-pack-year history of smoking. A pack year is smoking an average of one pack of cigarettes a day for 1 year.  Yearly screening should continue until it has been 15 years since you quit.  Yearly screening should stop if you develop a health problem that would prevent you from having lung cancer treatment.  Breast Cancer  Practice breast self-awareness. This means understanding how your breasts normally appear and feel.  It also means doing regular breast self-exams. Let your health care provider know about any changes, no matter how small.  If you are in your 20s or 30s, you should have a clinical breast exam (CBE) by a health care provider every 1-3 years as part of a regular health exam.  If you are 60 or older, have a CBE every year. Also consider having a breast X-ray (mammogram) every year.  If you have a family history of breast cancer, talk to your health care provider about genetic screening.  If you are at high risk for breast cancer, talk to your  health care provider about having an MRI and a mammogram every year.  Breast cancer gene (BRCA) assessment is recommended for women who have family members with BRCA-related cancers. BRCA-related cancers include: ? Breast. ? Ovarian. ? Tubal. ? Peritoneal cancers.  Results of the assessment will determine the need for genetic counseling and BRCA1 and BRCA2 testing.  Cervical Cancer Your health care provider may recommend that you be screened regularly for cancer of the pelvic organs (ovaries, uterus, and vagina). This screening involves a pelvic examination, including checking for microscopic changes to the surface of your cervix (Pap test). You may be encouraged to have this screening done every 3 years, beginning at age 81.  For women ages 42-65, health care providers may recommend pelvic exams and Pap testing  every 3 years, or they may recommend the Pap and pelvic exam, combined with testing for human papilloma virus (HPV), every 5 years. Some types of HPV increase your risk of cervical cancer. Testing for HPV may also be done on women of any age with unclear Pap test results.  Other health care providers may not recommend any screening for nonpregnant women who are considered low risk for pelvic cancer and who do not have symptoms. Ask your health care provider if a screening pelvic exam is right for you.  If you have had past treatment for cervical cancer or a condition that could lead to cancer, you need Pap tests and screening for cancer for at least 20 years after your treatment. If Pap tests have been discontinued, your risk factors (such as having a new sexual partner) need to be reassessed to determine if screening should resume. Some women have medical problems that increase the chance of getting cervical cancer. In these cases, your health care provider may recommend more frequent screening and Pap tests.  Colorectal Cancer  This type of cancer can be detected and often prevented.  Routine colorectal cancer screening usually begins at 81 years of age and continues through 81 years of age.  Your health care provider may recommend screening at an earlier age if you have risk factors for colon cancer.  Your health care provider may also recommend using home test kits to check for hidden blood in the stool.  A small camera at the end of a tube can be used to examine your colon directly (sigmoidoscopy or colonoscopy). This is done to check for the earliest forms of colorectal cancer.  Routine screening usually begins at age 83.  Direct examination of the colon should be repeated every 5-10 years through 81 years of age. However, you may need to be screened more often if early forms of precancerous polyps or small growths are found.  Skin Cancer  Check your skin from head to toe  regularly.  Tell your health care provider about any new moles or changes in moles, especially if there is a change in a mole's shape or color.  Also tell your health care provider if you have a mole that is larger than the size of a pencil eraser.  Always use sunscreen. Apply sunscreen liberally and repeatedly throughout the day.  Protect yourself by wearing long sleeves, pants, a wide-brimmed hat, and sunglasses whenever you are outside.  Heart disease, diabetes, and high blood pressure  High blood pressure causes heart disease and increases the risk of stroke. High blood pressure is more likely to develop in: ? People who have blood pressure in the high end of the normal range (130-139/85-89 mm Hg). ?  People who are overweight or obese. ? People who are African American.  If you are 16-19 years of age, have your blood pressure checked every 3-5 years. If you are 4 years of age or older, have your blood pressure checked every year. You should have your blood pressure measured twice-once when you are at a hospital or clinic, and once when you are not at a hospital or clinic. Record the average of the two measurements. To check your blood pressure when you are not at a hospital or clinic, you can use: ? An automated blood pressure machine at a pharmacy. ? A home blood pressure monitor.  If you are between 47 years and 2 years old, ask your health care provider if you should take aspirin to prevent strokes.  Have regular diabetes screenings. This involves taking a blood sample to check your fasting blood sugar level. ? If you are at a normal weight and have a low risk for diabetes, have this test once every three years after 81 years of age. ? If you are overweight and have a high risk for diabetes, consider being tested at a younger age or more often. Preventing infection Hepatitis B  If you have a higher risk for hepatitis B, you should be screened for this virus. You are considered  at high risk for hepatitis B if: ? You were born in a country where hepatitis B is common. Ask your health care provider which countries are considered high risk. ? Your parents were born in a high-risk country, and you have not been immunized against hepatitis B (hepatitis B vaccine). ? You have HIV or AIDS. ? You use needles to inject street drugs. ? You live with someone who has hepatitis B. ? You have had sex with someone who has hepatitis B. ? You get hemodialysis treatment. ? You take certain medicines for conditions, including cancer, organ transplantation, and autoimmune conditions.  Hepatitis C  Blood testing is recommended for: ? Everyone born from [birth year] through 1963. ? Anyone with known risk factors for hepatitis C.  Sexually transmitted infections (STIs)  You should be screened for sexually transmitted infections (STIs) including gonorrhea and chlamydia if: ? You are sexually active and are younger than 81 years of age. ? You are older than 81 years of age and your health care provider tells you that you are at risk for this type of infection. ? Your sexual activity has changed since you were last screened and you are at an increased risk for chlamydia or gonorrhea. Ask your health care provider if you are at risk.  If you do not have HIV, but are at risk, it may be recommended that you take a prescription medicine daily to prevent HIV infection. This is called pre-exposure prophylaxis (PrEP). You are considered at risk if: ? You are sexually active and do not regularly use condoms or know the HIV status of your partner(s). ? You take drugs by injection. ? You are sexually active with a partner who has HIV.  Talk with your health care provider about whether you are at high risk of being infected with HIV. If you choose to begin PrEP, you should first be tested for HIV. You should then be tested every 3 months for as long as you are taking PrEP. Pregnancy  If you are  premenopausal and you may become pregnant, ask your health care provider about preconception counseling.  If you may become pregnant, take 400 to 800 micrograms (mcg)  of folic acid every day.  If you want to prevent pregnancy, talk to your health care provider about birth control (contraception). Osteoporosis and menopause  Osteoporosis is a disease in which the bones lose minerals and strength with aging. This can result in serious bone fractures. Your risk for osteoporosis can be identified using a bone density scan.  If you are 48 years of age or older, or if you are at risk for osteoporosis and fractures, ask your health care provider if you should be screened.  Ask your health care provider whether you should take a calcium or vitamin D supplement to lower your risk for osteoporosis.  Menopause may have certain physical symptoms and risks.  Hormone replacement therapy may reduce some of these symptoms and risks. Talk to your health care provider about whether hormone replacement therapy is right for you. Follow these instructions at home:  Schedule regular health, dental, and eye exams.  Stay current with your immunizations.  Do not use any tobacco products including cigarettes, chewing tobacco, or electronic cigarettes.  If you are pregnant, do not drink alcohol.  If you are breastfeeding, limit how much and how often you drink alcohol.  Limit alcohol intake to no more than 1 drink per day for nonpregnant women. One drink equals 12 ounces of beer, 5 ounces of wine, or 1 ounces of hard liquor.  Do not use street drugs.  Do not share needles.  Ask your health care provider for help if you need support or information about quitting drugs.  Tell your health care provider if you often feel depressed.  Tell your health care provider if you have ever been abused or do not feel safe at home. This information is not intended to replace advice given to you by your health care  provider. Make sure you discuss any questions you have with your health care provider. Document Released: 01/06/2011 Document Revised: 11/29/2015 Document Reviewed: 03/27/2015 Elsevier Interactive Patient Education  2018 [place]. Thamar Holik M.D.

## 2017-11-25 ENCOUNTER — Encounter: Payer: Self-pay | Admitting: Internal Medicine

## 2017-11-25 ENCOUNTER — Ambulatory Visit (INDEPENDENT_AMBULATORY_CARE_PROVIDER_SITE_OTHER): Payer: Medicare Other | Admitting: Internal Medicine

## 2017-11-25 VITALS — BP 162/80 | HR 101 | Temp 98.1°F | Ht 62.5 in | Wt 115.1 lb

## 2017-11-25 DIAGNOSIS — Z0000 Encounter for general adult medical examination without abnormal findings: Secondary | ICD-10-CM | POA: Diagnosis not present

## 2017-11-25 DIAGNOSIS — Z79899 Other long term (current) drug therapy: Secondary | ICD-10-CM

## 2017-11-25 DIAGNOSIS — E039 Hypothyroidism, unspecified: Secondary | ICD-10-CM | POA: Diagnosis not present

## 2017-11-25 DIAGNOSIS — N63 Unspecified lump in unspecified breast: Secondary | ICD-10-CM

## 2017-11-25 DIAGNOSIS — I779 Disorder of arteries and arterioles, unspecified: Secondary | ICD-10-CM

## 2017-11-25 DIAGNOSIS — M810 Age-related osteoporosis without current pathological fracture: Secondary | ICD-10-CM | POA: Diagnosis not present

## 2017-11-25 DIAGNOSIS — R030 Elevated blood-pressure reading, without diagnosis of hypertension: Secondary | ICD-10-CM

## 2017-11-25 DIAGNOSIS — E785 Hyperlipidemia, unspecified: Secondary | ICD-10-CM

## 2017-11-25 DIAGNOSIS — I739 Peripheral vascular disease, unspecified: Secondary | ICD-10-CM

## 2017-11-25 DIAGNOSIS — R7309 Other abnormal glucose: Secondary | ICD-10-CM

## 2017-11-25 LAB — BASIC METABOLIC PANEL
BUN: 12 mg/dL (ref 6–23)
CALCIUM: 9.5 mg/dL (ref 8.4–10.5)
CO2: 28 meq/L (ref 19–32)
Chloride: 101 mEq/L (ref 96–112)
Creatinine, Ser: 0.59 mg/dL (ref 0.40–1.20)
GFR: 104.08 mL/min (ref >60.00)
GLUCOSE: 101 mg/dL — AB (ref 70–99)
Potassium: 4.3 mEq/L (ref 3.5–5.1)
Sodium: 137 mEq/L (ref 135–145)

## 2017-11-25 LAB — CBC WITH DIFFERENTIAL/PLATELET
BASOS PCT: 0.3 % (ref 0.0–3.0)
Basophils Absolute: 0 10*3/uL (ref 0.0–0.1)
EOS ABS: 0 10*3/uL (ref 0.0–0.7)
Eosinophils Relative: 0.3 % (ref 0.0–5.0)
HEMATOCRIT: 39.6 % (ref 36.0–46.0)
HEMOGLOBIN: 13.2 g/dL (ref 12.0–15.0)
LYMPHS PCT: 15.4 % (ref 12.0–46.0)
Lymphs Abs: 0.8 10*3/uL (ref 0.7–4.0)
MCHC: 33.4 g/dL (ref 30.0–36.0)
MCV: 104.4 fl — ABNORMAL HIGH (ref 78.0–100.0)
MONO ABS: 0.3 10*3/uL (ref 0.1–1.0)
Monocytes Relative: 7 % (ref 3.0–12.0)
Neutro Abs: 3.8 10*3/uL (ref 1.4–7.7)
Neutrophils Relative %: 77 % (ref 43.0–77.0)
Platelets: 274 10*3/uL (ref 150.0–400.0)
RBC: 3.8 Mil/uL — AB (ref 3.87–5.11)
RDW: 14.1 % (ref 11.5–15.5)
WBC: 5 10*3/uL (ref 4.0–10.5)

## 2017-11-25 LAB — LIPID PANEL
CHOL/HDL RATIO: 3
Cholesterol: 199 mg/dL (ref 0–200)
HDL: 57.7 mg/dL (ref >39.00)
LDL CALC: 130 mg/dL — AB (ref 0–99)
NonHDL: 141.79
Triglycerides: 60 mg/dL (ref 0.0–149.0)
VLDL: 12 mg/dL (ref 0.0–40.0)

## 2017-11-25 LAB — HEPATIC FUNCTION PANEL
ALK PHOS: 59 U/L (ref 39–117)
ALT: 22 U/L (ref 0–35)
AST: 22 U/L (ref 0–37)
Albumin: 4.3 g/dL (ref 3.5–5.2)
BILIRUBIN DIRECT: 0.1 mg/dL (ref 0.0–0.3)
BILIRUBIN TOTAL: 0.6 mg/dL (ref 0.2–1.2)
Total Protein: 7.1 g/dL (ref 6.0–8.3)

## 2017-11-25 LAB — TSH: TSH: 1 u[IU]/mL (ref 0.35–4.50)

## 2017-11-25 LAB — HEMOGLOBIN A1C: Hgb A1c MFr Bld: 5.6 % (ref 4.6–6.5)

## 2017-11-25 NOTE — Patient Instructions (Addendum)
Please continue to monitor BP readings and  Take in your monitor when you go to see dr Harrington Challenger to make sure still in range .    Will notify you  of labs when available.  malke sure you are getting vit D .    Health Maintenance, Female Adopting a healthy lifestyle and getting preventive care can go a long way to promote health and wellness. Talk with your health care provider about what schedule of regular examinations is right for you. This is a good chance for you to check in with your provider about disease prevention and staying healthy. In between checkups, there are plenty of things you can do on your own. Experts have done a lot of research about which lifestyle changes and preventive measures are most likely to keep you healthy. Ask your health care provider for more information. Weight and diet Eat a healthy diet  Be sure to include plenty of vegetables, fruits, low-fat dairy products, and lean protein.  Do not eat a lot of foods high in solid fats, added sugars, or salt.  Get regular exercise. This is one of the most important things you can do for your health. ? Most adults should exercise for at least 150 minutes each week. The exercise should increase your heart rate and make you sweat (moderate-intensity exercise). ? Most adults should also do strengthening exercises at least twice a week. This is in addition to the moderate-intensity exercise.  Maintain a healthy weight  Body mass index (BMI) is a measurement that can be used to identify possible weight problems. It estimates body fat based on height and weight. Your health care provider can help determine your BMI and help you achieve or maintain a healthy weight.  For females 53 years of age and older: ? A BMI below 18.5 is considered underweight. ? A BMI of 18.5 to 24.9 is normal. ? A BMI of 25 to 29.9 is considered overweight. ? A BMI of 30 and above is considered obese.  Watch levels of cholesterol and blood  lipids  You should start having your blood tested for lipids and cholesterol at 81 years of age, then have this test every 5 years.  You may need to have your cholesterol levels checked more often if: ? Your lipid or cholesterol levels are high. ? You are older than 81 years of age. ? You are at high risk for heart disease.  Cancer screening Lung Cancer  Lung cancer screening is recommended for adults 50-33 years old who are at high risk for lung cancer because of a history of smoking.  A yearly low-dose CT scan of the lungs is recommended for people who: ? Currently smoke. ? Have quit within the past 15 years. ? Have at least a 30-pack-year history of smoking. A pack year is smoking an average of one pack of cigarettes a day for 1 year.  Yearly screening should continue until it has been 15 years since you quit.  Yearly screening should stop if you develop a health problem that would prevent you from having lung cancer treatment.  Breast Cancer  Practice breast self-awareness. This means understanding how your breasts normally appear and feel.  It also means doing regular breast self-exams. Let your health care provider know about any changes, no matter how small.  If you are in your 20s or 30s, you should have a clinical breast exam (CBE) by a health care provider every 1-3 years as part of a regular  health exam.  If you are 40 or older, have a CBE every year. Also consider having a breast X-ray (mammogram) every year.  If you have a family history of breast cancer, talk to your health care provider about genetic screening.  If you are at high risk for breast cancer, talk to your health care provider about having an MRI and a mammogram every year.  Breast cancer gene (BRCA) assessment is recommended for women who have family members with BRCA-related cancers. BRCA-related cancers include: ? Breast. ? Ovarian. ? Tubal. ? Peritoneal cancers.  Results of the assessment will  determine the need for genetic counseling and BRCA1 and BRCA2 testing.  Cervical Cancer Your health care provider may recommend that you be screened regularly for cancer of the pelvic organs (ovaries, uterus, and vagina). This screening involves a pelvic examination, including checking for microscopic changes to the surface of your cervix (Pap test). You may be encouraged to have this screening done every 3 years, beginning at age 75.  For women ages 30-65, health care providers may recommend pelvic exams and Pap testing every 3 years, or they may recommend the Pap and pelvic exam, combined with testing for human papilloma virus (HPV), every 5 years. Some types of HPV increase your risk of cervical cancer. Testing for HPV may also be done on women of any age with unclear Pap test results.  Other health care providers may not recommend any screening for nonpregnant women who are considered low risk for pelvic cancer and who do not have symptoms. Ask your health care provider if a screening pelvic exam is right for you.  If you have had past treatment for cervical cancer or a condition that could lead to cancer, you need Pap tests and screening for cancer for at least 20 years after your treatment. If Pap tests have been discontinued, your risk factors (such as having a new sexual partner) need to be reassessed to determine if screening should resume. Some women have medical problems that increase the chance of getting cervical cancer. In these cases, your health care provider may recommend more frequent screening and Pap tests.  Colorectal Cancer  This type of cancer can be detected and often prevented.  Routine colorectal cancer screening usually begins at 81 years of age and continues through 81 years of age.  Your health care provider may recommend screening at an earlier age if you have risk factors for colon cancer.  Your health care provider may also recommend using home test kits to check  for hidden blood in the stool.  A small camera at the end of a tube can be used to examine your colon directly (sigmoidoscopy or colonoscopy). This is done to check for the earliest forms of colorectal cancer.  Routine screening usually begins at age 81.  Direct examination of the colon should be repeated every 5-10 years through 81 years of age. However, you may need to be screened more often if early forms of precancerous polyps or small growths are found.  Skin Cancer  Check your skin from head to toe regularly.  Tell your health care provider about any new moles or changes in moles, especially if there is a change in a mole's shape or color.  Also tell your health care provider if you have a mole that is larger than the size of a pencil eraser.  Always use sunscreen. Apply sunscreen liberally and repeatedly throughout the day.  Protect yourself by wearing long sleeves, pants, a  wide-brimmed hat, and sunglasses whenever you are outside.  Heart disease, diabetes, and high blood pressure  High blood pressure causes heart disease and increases the risk of stroke. High blood pressure is more likely to develop in: ? People who have blood pressure in the high end of the normal range (130-139/85-89 mm Hg). ? People who are overweight or obese. ? People who are African American.  If you are 33-38 years of age, have your blood pressure checked every 3-5 years. If you are 47 years of age or older, have your blood pressure checked every year. You should have your blood pressure measured twice-once when you are at a hospital or clinic, and once when you are not at a hospital or clinic. Record the average of the two measurements. To check your blood pressure when you are not at a hospital or clinic, you can use: ? An automated blood pressure machine at a pharmacy. ? A home blood pressure monitor.  If you are between 2 years and 60 years old, ask your health care provider if you should take  aspirin to prevent strokes.  Have regular diabetes screenings. This involves taking a blood sample to check your fasting blood sugar level. ? If you are at a normal weight and have a low risk for diabetes, have this test once every three years after 81 years of age. ? If you are overweight and have a high risk for diabetes, consider being tested at a younger age or more often. Preventing infection Hepatitis B  If you have a higher risk for hepatitis B, you should be screened for this virus. You are considered at high risk for hepatitis B if: ? You were born in a country where hepatitis B is common. Ask your health care provider which countries are considered high risk. ? Your parents were born in a high-risk country, and you have not been immunized against hepatitis B (hepatitis B vaccine). ? You have HIV or AIDS. ? You use needles to inject street drugs. ? You live with someone who has hepatitis B. ? You have had sex with someone who has hepatitis B. ? You get hemodialysis treatment. ? You take certain medicines for conditions, including cancer, organ transplantation, and autoimmune conditions.  Hepatitis C  Blood testing is recommended for: ? Everyone born from [birth year] through 1963. ? Anyone with known risk factors for hepatitis C.  Sexually transmitted infections (STIs)  You should be screened for sexually transmitted infections (STIs) including gonorrhea and chlamydia if: ? You are sexually active and are younger than 81 years of age. ? You are older than 81 years of age and your health care provider tells you that you are at risk for this type of infection. ? Your sexual activity has changed since you were last screened and you are at an increased risk for chlamydia or gonorrhea. Ask your health care provider if you are at risk.  If you do not have HIV, but are at risk, it may be recommended that you take a prescription medicine daily to prevent HIV infection. This is called  pre-exposure prophylaxis (PrEP). You are considered at risk if: ? You are sexually active and do not regularly use condoms or know the HIV status of your partner(s). ? You take drugs by injection. ? You are sexually active with a partner who has HIV.  Talk with your health care provider about whether you are at high risk of being infected with HIV. If you choose to  begin PrEP, you should first be tested for HIV. You should then be tested every 3 months for as long as you are taking PrEP. Pregnancy  If you are premenopausal and you may become pregnant, ask your health care provider about preconception counseling.  If you may become pregnant, take 400 to 800 micrograms (mcg) of folic acid every day.  If you want to prevent pregnancy, talk to your health care provider about birth control (contraception). Osteoporosis and menopause  Osteoporosis is a disease in which the bones lose minerals and strength with aging. This can result in serious bone fractures. Your risk for osteoporosis can be identified using a bone density scan.  If you are 9 years of age or older, or if you are at risk for osteoporosis and fractures, ask your health care provider if you should be screened.  Ask your health care provider whether you should take a calcium or vitamin D supplement to lower your risk for osteoporosis.  Menopause may have certain physical symptoms and risks.  Hormone replacement therapy may reduce some of these symptoms and risks. Talk to your health care provider about whether hormone replacement therapy is right for you. Follow these instructions at home:  Schedule regular health, dental, and eye exams.  Stay current with your immunizations.  Do not use any tobacco products including cigarettes, chewing tobacco, or electronic cigarettes.  If you are pregnant, do not drink alcohol.  If you are breastfeeding, limit how much and how often you drink alcohol.  Limit alcohol intake to no more  than 1 drink per day for nonpregnant women. One drink equals 12 ounces of beer, 5 ounces of wine, or 1 ounces of hard liquor.  Do not use street drugs.  Do not share needles.  Ask your health care provider for help if you need support or information about quitting drugs.  Tell your health care provider if you often feel depressed.  Tell your health care provider if you have ever been abused or do not feel safe at home. This information is not intended to replace advice given to you by your health care provider. Make sure you discuss any questions you have with your health care provider. Document Released: 01/06/2011 Document Revised: 11/29/2015 Document Reviewed: 03/27/2015 Elsevier Interactive Patient Education  [employer].

## 2017-12-01 ENCOUNTER — Ambulatory Visit: Payer: Medicare Other | Admitting: Internal Medicine

## 2017-12-01 ENCOUNTER — Encounter: Payer: Self-pay | Admitting: Internal Medicine

## 2017-12-01 VITALS — BP 170/72 | HR 86 | Ht 62.5 in | Wt 114.1 lb

## 2017-12-01 DIAGNOSIS — E782 Mixed hyperlipidemia: Secondary | ICD-10-CM

## 2017-12-01 DIAGNOSIS — I6523 Occlusion and stenosis of bilateral carotid arteries: Secondary | ICD-10-CM | POA: Diagnosis not present

## 2017-12-01 DIAGNOSIS — I10 Essential (primary) hypertension: Secondary | ICD-10-CM

## 2017-12-01 DIAGNOSIS — I25700 Atherosclerosis of coronary artery bypass graft(s), unspecified, with unstable angina pectoris: Secondary | ICD-10-CM

## 2017-12-01 NOTE — Patient Instructions (Signed)
Your physician recommends that you continue on your current medications as directed. Please refer to the Current Medication list given to you today. Your physician wants you to follow-up in: 1 year with Dr. Ross.  You will receive a reminder letter in the mail two months in advance. If you don't receive a letter, please call our office to schedule the follow-up appointment.  

## 2017-12-01 NOTE — Progress Notes (Signed)
++   Cardiology Office Note   Date:  12/01/2017   ID:  Natasha Harper, DOB 12-Feb-1935, MRN [id number]  PCP:  [name], MD  Cardiologist:   Dietrich Pates, MD   F/U  of CAD      History of Present Illness: Natasha Harper is a 83 y.o. female with a history of CAD  Remote intervention in 1993  Stress test in 2013  Aslo a nistory of mod CV dz, HL  I saw her in March 2018    Since seen she denies CP   Breathihg is good   Walks in morning    No dizziness   BP is better at home    She says it is always high in doctors office     Current Meds  Medication Sig  . Ascorbic Acid (VITAMIN C) 100 MG tablet Take 100 mg by mouth daily.    Marland Kitchen aspirin 81 MG tablet Take 81 mg by mouth daily.    Marland Kitchen atorvastatin (LIPITOR) 80 MG tablet TAKE 1 TABLET BY MOUTH DAILY. PATIENT NEEDS APPOINTMENT FOR FUTHER REFILLS.  . fish oil-omega-3 fatty acids 1000 MG capsule Take 2 g by mouth daily.    . MULTIPLE VITAMIN PO Take 1 tablet by mouth daily.   Marland Kitchen SYNTHROID 50 MCG tablet TAKE 1 BY MOUTH DAILY     Allergies:   Actonel [risedronate sodium]; Evista [raloxifene]; and Fosamax [alendronate sodium]   Past Medical History:  Diagnosis Date  . CAD (coronary artery disease)    PCI to RCA and diagonal in remote past, residual 70% LAD  /   nuclear, 2007, no ischemia  . Carotid arterial disease (HCC)    Doppler, June, 2011, stable, 60-79% R. ICA, 40-59% LICA  . Contact lens/glasses fitting    wears contacts or glasses  . Dyslipidemia    Significant drop in LDL from Lipitor even though LDL remains high  . Ejection fraction    EF 65%, nuclear, 2007  . Fibrocystic breast   . HTN (hypertension)     no med  . Hypercholesterolemia   . Hypothyroidism    Thyroid surgery in the past, thyroid nodules followed by Dr.Ellison  . Lump or mass in breast 07/19/2007   Excised 01/12/13. B9 on pathQualifier: Diagnosis of  By: Fabian Sharp MD, Neta Mends    . Osteoporosis   . Prominent abdominal aortic pulsation    No abdominal  aneurysm by ultrasound  . Rectal fissure     Past Surgical History:  Procedure Laterality Date  . BREAST BIOPSY Right 01/12/2013   Procedure: Removal of right breast mass;  Surgeon: Currie Paris, MD;  Location: [facility];  Service: General;  Laterality: Right;  . BREAST EXCISIONAL BIOPSY  9/14   scar tissue from the needle biopsy  . CORONARY ANGIOPLASTY  1993  . RECTOPERITONEAL FISTULA CLOSURE    . THYROID CYST EXCISION       Social History:  The patient  reports that she quit smoking about 59 years ago. She has never used smokeless tobacco. She reports that she does not drink alcohol or use drugs.   Family History:  The patient's family history includes Breast cancer in her daughter; Heart attack in her father; Osteoporosis in her mother.    ROS:  Please see the history of present illness. All other systems are reviewed and  Negative to the above problem except as noted.    PHYSICAL EXAM: VS:  BP (!) 170/72  Pulse 86   Ht 5' 2.5" (1.588 m)   Wt 51.8 kg (114 lb 1.9 oz)   LMP 07/07/1997   BMI 20.54 kg/m   GEN: Well nourished, well developed, in no acute distress  HEENT: normal  Neck: JVP is normal    Cardiac: RRR; no murmurs, rubs, or gallops,no edema  Respiratory:  clear to auscultation bilaterally, normal work of breathing GI: soft, nontender, nondistended, + BS  No hepatomegaly  MS: no deformity Moving all extremities   Skin: warm and dry, no rash Neuro:  Strength and sensation are intact Psych: euthymic mood, full affect   EKG:  EKG is  ordered today.  SR 86 bpm     Lipid Panel    Component Value Date/Time   CHOL 199 11/25/2017 1023   TRIG 60.0 11/25/2017 1023   HDL 57.70 11/25/2017 1023   CHOLHDL 3 11/25/2017 1023   VLDL 12.0 11/25/2017 1023   LDLCALC 130 (H) 11/25/2017 1023   LDLDIRECT 152.1 01/16/2010 1015      Wt Readings from Last 3 Encounters:  12/01/17 51.8 kg (114 lb 1.9 oz)  11/25/17 52.2 kg (115 lb 1.6 oz)  07/16/17 52.3  kg (115 lb 4 oz)      ASSESSMENT AND PLAN:  1  HTN  BP remains high in clinic   She says much better at home   [facility] has brought cuff in past   I encouraged her to follow closely      2  CAD  No symtpoms to sugg angina    3  CV dz    Remains mild     4  HL  LDL remains in 130s   She says she is taking lipitor daily   In past she has been adament not to change   Would recomm she continue     Pt wants f/u in 1 year   No sooner    WIll call with problems     Current medicines are reviewed at length with the patient today.  The patient does not have concerns regarding medicines.  Signed, Dietrich Pates, MD  12/01/2017 9:31 AM    [facility] [address] [place], [place], [place]  [address] Phone: [phone]; Fax: [phone]

## 2018-01-15 ENCOUNTER — Encounter: Payer: Medicare Other | Admitting: Internal Medicine

## 2018-02-24 ENCOUNTER — Other Ambulatory Visit: Payer: Self-pay | Admitting: Internal Medicine

## 2018-05-05 ENCOUNTER — Telehealth: Payer: Self-pay | Admitting: *Deleted

## 2018-05-05 NOTE — Telephone Encounter (Signed)
Prior auth for Synthroid sent to [email or web address] [id number].

## 2018-05-05 NOTE — Telephone Encounter (Signed)
05/05/2018-Noted.  Copied from CRM [id number]. Topic: General - Other >> May 05, 2018 11:27 AM Tamela Oddi wrote: Reason for CRM: Erie Noe with [employer] called to inform the doctor of an approved medication for SYNTHROID 50 MCG tablet.  Please advise if there are any questions.  CB# [phone].

## 2018-05-10 ENCOUNTER — Ambulatory Visit (INDEPENDENT_AMBULATORY_CARE_PROVIDER_SITE_OTHER): Payer: Medicare Other | Admitting: *Deleted

## 2018-05-10 DIAGNOSIS — Z23 Encounter for immunization: Secondary | ICD-10-CM | POA: Diagnosis not present

## 2018-09-17 ENCOUNTER — Ambulatory Visit: Payer: Medicare Other | Admitting: Obstetrics & Gynecology

## 2018-10-25 ENCOUNTER — Other Ambulatory Visit: Payer: Self-pay | Admitting: Internal Medicine

## 2019-07-16 ENCOUNTER — Ambulatory Visit: Payer: Medicare Other | Attending: Internal Medicine

## 2019-07-16 DIAGNOSIS — Z23 Encounter for immunization: Secondary | ICD-10-CM

## 2019-07-16 NOTE — Progress Notes (Signed)
   Covid-19 Vaccination Clinic  Name:  Natasha Harper    MRN: [id number] DOB: [birth year]  07/16/2019  [name] was observed post Covid-19 immunization for 30 minutes based on pre-vaccination screening without incidence. She was provided with Vaccine Information Sheet and instruction to access the V-Safe system.   [name] was instructed to call 911 with any severe reactions post vaccine: Marland Kitchen Difficulty breathing  . Swelling of your face and throat  . A fast heartbeat  . A bad rash all over your body  . Dizziness and weakness    Immunizations Administered    Name Date Dose VIS Date Route   Pfizer COVID-19 Vaccine 07/16/2019  2:34 PM 0.3 mL 06/17/2019 Intramuscular   Manufacturer: [employer], [employer]   Lot: [id number]   NDC: [address]-3

## 2019-07-27 ENCOUNTER — Other Ambulatory Visit: Payer: Self-pay | Admitting: Internal Medicine

## 2019-08-05 ENCOUNTER — Ambulatory Visit: Payer: Medicare Other

## 2019-08-06 ENCOUNTER — Ambulatory Visit: Payer: Medicare Other | Attending: Internal Medicine

## 2019-08-06 DIAGNOSIS — Z23 Encounter for immunization: Secondary | ICD-10-CM | POA: Insufficient documentation

## 2019-08-06 NOTE — Progress Notes (Signed)
   Covid-19 Vaccination Clinic  Name:  Natasha Harper    MRN: [id number] DOB: 04-Jun-1935  08/06/2019  [name] was observed post Covid-19 immunization for 15 minutes without incidence. She was provided with Vaccine Information Sheet and instruction to access the V-Safe system.   [name] was instructed to call 911 with any severe reactions post vaccine: Marland Kitchen Difficulty breathing  . Swelling of your face and throat  . A fast heartbeat  . A bad rash all over your body  . Dizziness and weakness    Immunizations Administered    Name Date Dose VIS Date Route   Pfizer COVID-19 Vaccine 08/06/2019 10:38 AM 0.3 mL 06/17/2019 Intramuscular   Manufacturer: [employer], [employer]   Lot: [id number]   NDC: [address]-7

## 2019-09-02 ENCOUNTER — Telehealth: Payer: Self-pay | Admitting: Internal Medicine

## 2019-09-02 MED ORDER — ATORVASTATIN CALCIUM 80 MG PO TABS: ORAL_TABLET | ORAL | 0 refills | Status: DC

## 2019-09-02 NOTE — Telephone Encounter (Signed)
Medication has been sent in 

## 2019-09-02 NOTE — Telephone Encounter (Signed)
Medication refill: atorvastatin  Pharmacy: [facility]  FAX: [phone]  Pt is completely out of this medication and due to her husband having surgery she will not be able until after March 11th. I have scheduled her a cpe for September 19, 2019 at 10:00am. Pt is asking if she can have it filled until she can come in at that time

## 2019-09-16 NOTE — Progress Notes (Signed)
Chief Complaint  Patient presents with  . Annual Exam    HPI: Patient  Natasha Harper  85 y.o. comes in today for Preventive Health Care visit  She is hard of hearing exp with mask.     Says her bp syst is in the 120 130 range at hoime  Last checked   In past month No new ss  Sees Dr Tenny Craw yearly  No cv pulm sx reported  Taking vit d  caclium upsets stomach HLD taking  atorva  Thyroid taking med  synthroid  Has had both Covid  shioots  Masks is uncomfortable breathing after a while and is looking forward to open breathing   Health Maintenance  Topic Date Due  . DEXA SCAN  Never done  . INFLUENZA VACCINE  02/05/2019  . TETANUS/TDAP  12/19/2024  . PNA vac Low Risk Adult  Completed   Health Maintenance Review LIFESTYLE:  Exercise:  walka a lot and house ework  Tobacco/ETS: Alcohol:  rew wine  Sugar beverages: Sleep: up twice   About 7-8  Drug use: no HH of  2   Denies fall or depression but gets anxious   ROS:  GEN/ HEENT: No fever, significant weight changes sweats headaches vision problemsCV/ PULM; No chest pain shortness of breath cough, syncope,edema  change in exercise tolerance. GI /GU: No adominal pain, vomiting, change in bowel habits. No blood in the stool. SKIN/HEME: ,no acute skin rashes suspicious lesions or bleeding. No lymphadenopathy, nodules, masses.  NEURO/ PSYCH:  No new neurologic signs such as weakness numbness. Tends to be anxious in medical visits  IMM/ Allergy: No unusual infections.  Allergy .   REST of 12 system review negative except as per HPI   Past Medical History:  Diagnosis Date  . CAD (coronary artery disease)    PCI to RCA and diagonal in remote past, residual 70% LAD  /   nuclear, 2007, no ischemia  . Carotid arterial disease (HCC)    Doppler, June, 2011, stable, 60-79% R. ICA, 40-59% LICA  . Contact lens/glasses fitting    wears contacts or glasses  . Dyslipidemia    Significant drop in LDL from Lipitor even though LDL remains  high  . Ejection fraction    EF 65%, nuclear, 2007  . Fibrocystic breast   . HTN (hypertension)     no med  . Hypercholesterolemia   . Hypothyroidism    Thyroid surgery in the past, thyroid nodules followed by Dr.Ellison  . Lump or mass in breast 07/19/2007   Excised 01/12/13. B9 on pathQualifier: Diagnosis of  By: Fabian Sharp MD, Neta Mends    . Osteoporosis   . Prominent abdominal aortic pulsation    No abdominal aneurysm by ultrasound  . Rectal fissure     Past Surgical History:  Procedure Laterality Date  . BREAST BIOPSY Right 01/12/2013   Procedure: Removal of right breast mass;  Surgeon: Currie Paris, MD;  Location: [facility];  Service: General;  Laterality: Right;  . BREAST EXCISIONAL BIOPSY  9/14   scar tissue from the needle biopsy  . CORONARY ANGIOPLASTY  1993  . RECTOPERITONEAL FISTULA CLOSURE    . THYROID CYST EXCISION      Family History  Problem Relation Age of Onset  . Heart attack Father   . Osteoporosis Mother   . Breast cancer Daughter     Social History   Socioeconomic History  . Marital status: Married    Spouse name:  Not on file  . Number of children: Not on file  . Years of education: Not on file  . Highest education level: Not on file  Occupational History  . Not on file  Tobacco Use  . Smoking status: Former Smoker    Quit date: 07/07/1958    Years since quitting: 61.2  . Smokeless tobacco: Never Used  Substance and Sexual Activity  . Alcohol use: No  . Drug use: No  . Sexual activity: Yes    Partners: Male  Other Topics Concern  . Not on file  Social History Narrative   HH     Of 2  No pets    Family from [place]    Exercise   Walks every day at least 30 minutes  2 miles .   Sleep  Ok 10 - 7    [place]     Social Determinants of [employer]   Financial Resource Strain:   . Difficulty of Paying Living Expenses:   Food Insecurity:   . Worried About Programme researcher, broadcasting/film/videounning Out of Food in the Last Year:   . Baristaan Out of Food  in the Last Year:   Transportation Needs:   . Freight forwarderLack of Transportation (Medical):   Marland Kitchen. Lack of Transportation (Non-Medical):   Physical Activity:   . Days of Exercise per Week:   . Minutes of Exercise per Session:   Stress:   . Feeling of Stress :   Social Connections:   . Frequency of Communication with Friends and Family:   . Frequency of Social Gatherings with Friends and Family:   . Attends Religious Services:   . Active Member of Clubs or Organizations:   . Attends BankerClub or Organization Meetings:   Marland Kitchen. Marital Status:     Outpatient Medications Prior to Visit  Medication Sig Dispense Refill  . Ascorbic Acid (VITAMIN C) 100 MG tablet Take 100 mg by mouth daily.      Marland Kitchen. aspirin 81 MG tablet Take 81 mg by mouth daily.      Marland Kitchen. atorvastatin (LIPITOR) 80 MG tablet TAKE 1 TABLET BY MOUTH DAILY 90 tablet 0  . fish oil-omega-3 fatty acids 1000 MG capsule Take 2 g by mouth daily.      . MULTIPLE VITAMIN PO Take 1 tablet by mouth daily.     Marland Kitchen. SYNTHROID 50 MCG tablet TAKE 1 TABLET BY MOUTH DAILY 90 tablet 3   No facility-administered medications prior to visit.     EXAM:  BP (!) 160/80   Pulse 95   Temp 98 F (36.7 C) (Other (Comment))   Ht 5' 1.5" (1.562 m)   Wt 112 lb (50.8 kg)   LMP 07/07/1997   SpO2 99%   BMI 20.82 kg/m  Reports  120 sytolic  Range.  In feb  Body mass index is 20.82 kg/m. Wt Readings from Last 3 Encounters:  09/19/19 112 lb (50.8 kg)  12/01/17 114 lb 1.9 oz (51.8 kg)  11/25/17 115 lb 1.6 oz (52.2 kg)    Physical Exam: Vital signs reviewed ZOX:WRUEGEN:This is a well-developed well-nourished alert cooperative    who appearsr stated age in no acute distress. Had to repeat and lift my mask  She is hard of hearing  HEENT: normocephalic atraumatic , Eyes: PERRL EOM's full, conjunctiva clear, ., Ears: no deformity EAC's clear TMs with normal landmarks. Mouth: clear OP, masked NECK: supple without masses, thyromegaly or bruits. CHEST/PULM:  Clear to auscultation and  percussion breath sounds equal no  wheeze , rales or rhonchi. No chest wall deformities or tenderness. CV: PMI is nondisplaced, S1 S2 no gallops, murmurs, rubs. Peripheral pulses are present without delay.No JVD .  ABDOMEN: Bowel sounds normal nontender  No guard or rebound, no hepato splenomegal no CVA tenderness.   Extremtities:  No clubbing cyanosis or edema, no acute joint swelling or redness no focal atrophy  Many superficial vv in feet  But no edema  Some djd changes  Bunion feet but no callous or ulcers  NEURO:  Oriented x3, cranial nerves 3-12 appear to be intact,x hearing   no obvious focal weakness,gait within normal limits no abnormal reflexes gait nl  Memory not tested  today SKIN: No acute rashes normal turgor, color, no bruising or petechiae. Age changes  PSYCH: Oriented, good eye contact, no obvious depression anxiety,says  bp goes up in doctors office   LN: no cervical axillary inguinal adenopathy  Lab Results  Component Value Date   WBC 5.0 11/25/2017   HGB 13.2 11/25/2017   HCT 39.6 11/25/2017   PLT 274.0 11/25/2017   GLUCOSE 101 (H) 11/25/2017   CHOL 199 11/25/2017   TRIG 60.0 11/25/2017   HDL 57.70 11/25/2017   LDLDIRECT 152.1 01/16/2010   LDLCALC 130 (H) 11/25/2017   ALT 22 11/25/2017   AST 22 11/25/2017   NA 137 11/25/2017   K 4.3 11/25/2017   CL 101 11/25/2017   CREATININE 0.59 11/25/2017   BUN 12 11/25/2017   CO2 28 11/25/2017   TSH 1.00 11/25/2017   HGBA1C 5.6 11/25/2017    BP Readings from Last 3 Encounters:  09/19/19 (!) 160/80  12/01/17 (!) 170/72  11/25/17 (!) 162/80  repeat 140/90  Lab plan reviewed with patient   ASSESSMENT AND PLAN:  Discussed the following assessment and plan:    ICD-10-CM   1. Visit for preventive health examination  Z00.00   2. Elevated blood pressure reading without diagnosis of hypertension ?  Z61.0 Basic metabolic panel    CBC with Differential/Platelet    Hepatic function panel    Lipid panel    TSH    T4, free     VITAMIN D 25 Hydroxy (Vit-D Deficiency, Fractures)  3. Medication management  R60.454 Basic metabolic panel    CBC with Differential/Platelet    Hepatic function panel    Lipid panel    TSH    T4, free    VITAMIN D 25 Hydroxy (Vit-D Deficiency, Fractures)  4. Hypothyroidism, unspecified type  U98.1 Basic metabolic panel    CBC with Differential/Platelet    Hepatic function panel    Lipid panel    TSH    T4, free    VITAMIN D 25 Hydroxy (Vit-D Deficiency, Fractures)  5. Dyslipidemia  X91.4 Basic metabolic panel    CBC with Differential/Platelet    Hepatic function panel    Lipid panel    TSH    T4, free    VITAMIN D 25 Hydroxy (Vit-D Deficiency, Fractures)  6. Disorder of bone and cartilage  M89.9 VITAMIN D 25 Hydroxy (Vit-D Deficiency, Fractures)   M94.9   7. Decreased hearing of both ears  H91.93    aggravated by masking   Return in about 1 year (around 09/18/2020) for depending on results and BP control. Bp monitoring at home advised   Continue weight bearing  Exercise and vit d  Strongly consider hearing assistance   A barrier to best communication .  NO medication changes  Patient Care Team: Shanon Ace  K, MD as PCP - General Pricilla Riffle, MD as PCP - Cardiology (Cardiology) Jerene Bears, MD (Gynecology) Pricilla Riffle, MD as Consulting Physician (Cardiology) Jethro Bolus, MD as Consulting Physician (Ophthalmology) Patient Instructions  Please check your BP readings at home every 1-2 weeks to make sure   In range  Below 140/90 would be best .   Will notify of labs when available.   Keep up exercise  Vit d   1000 - 4000 IU per day  For bone health Strongly advise  Check in to hearing assistance   To help with communication   .   Health Maintenance, Female Adopting a healthy lifestyle and getting preventive care are important in promoting health and wellness. Ask your health care provider about:  The right schedule for you to have regular tests and  exams.  Things you can do on your own to prevent diseases and keep yourself healthy. What should I know about diet, weight, and exercise? Eat a healthy diet   Eat a diet that includes plenty of vegetables, fruits, low-fat dairy products, and lean protein.  Do not eat a lot of foods that are high in solid fats, added sugars, or sodium. Maintain a healthy weight Body mass index (BMI) is used to identify weight problems. It estimates body fat based on height and weight. Your health care provider can help determine your BMI and help you achieve or maintain a healthy weight. Get regular exercise Get regular exercise. This is one of the most important things you can do for your health. Most adults should:  Exercise for at least 150 minutes each week. The exercise should increase your heart rate and make you sweat (moderate-intensity exercise).  Do strengthening exercises at least twice a week. This is in addition to the moderate-intensity exercise.  Spend less time sitting. Even light physical activity can be beneficial. Watch cholesterol and blood lipids Have your blood tested for lipids and cholesterol at 83 years of age, then have this test every 5 years. Have your cholesterol levels checked more often if:  Your lipid or cholesterol levels are high.  You are older than 83 years of age.  You are at high risk for heart disease. What should I know about cancer screening? Depending on your health history and family history, you may need to have cancer screening at various ages. This may include screening for:  Breast cancer.  Cervical cancer.  Colorectal cancer.  Skin cancer.  Lung cancer. What should I know about heart disease, diabetes, and high blood pressure? Blood pressure and heart disease  High blood pressure causes heart disease and increases the risk of stroke. This is more likely to develop in people who have high blood pressure readings, are of African descent, or are  overweight.  Have your blood pressure checked: ? Every 3-5 years if you are 24-58 years of age. ? Every year if you are 88 years old or older. Diabetes Have regular diabetes screenings. This checks your fasting blood sugar level. Have the screening done:  Once every three years after age 70 if you are at a normal weight and have a low risk for diabetes.  More often and at a younger age if you are overweight or have a high risk for diabetes. What should I know about preventing infection? Hepatitis B If you have a higher risk for hepatitis B, you should be screened for this virus. Talk with your health care provider to find out if  you are at risk for hepatitis B infection. Hepatitis C Testing is recommended for:  Everyone born from [birth year] through 1963.  Anyone with known risk factors for hepatitis C. Sexually transmitted infections (STIs)  Get screened for STIs, including gonorrhea and chlamydia, if: ? You are sexually active and are younger than 84 years of age. ? You are older than 83 years of age and your health care provider tells you that you are at risk for this type of infection. ? Your sexual activity has changed since you were last screened, and you are at increased risk for chlamydia or gonorrhea. Ask your health care provider if you are at risk.  Ask your health care provider about whether you are at high risk for HIV. Your health care provider may recommend a prescription medicine to help prevent HIV infection. If you choose to take medicine to prevent HIV, you should first get tested for HIV. You should then be tested every 3 months for as long as you are taking the medicine. Pregnancy  If you are about to stop having your period (premenopausal) and you may become pregnant, seek counseling before you get pregnant.  Take 400 to 800 micrograms (mcg) of folic acid every day if you become pregnant.  Ask for birth control (contraception) if you want to prevent  pregnancy. Osteoporosis and menopause Osteoporosis is a disease in which the bones lose minerals and strength with aging. This can result in bone fractures. If you are 46 years old or older, or if you are at risk for osteoporosis and fractures, ask your health care provider if you should:  Be screened for bone loss.  Take a calcium or vitamin D supplement to lower your risk of fractures.  Be given hormone replacement therapy (HRT) to treat symptoms of menopause. Follow these instructions at home: Lifestyle  Do not use any products that contain nicotine or tobacco, such as cigarettes, e-cigarettes, and chewing tobacco. If you need help quitting, ask your health care provider.  Do not use street drugs.  Do not share needles.  Ask your health care provider for help if you need support or information about quitting drugs. Alcohol use  Do not drink alcohol if: ? Your health care provider tells you not to drink. ? You are pregnant, may be pregnant, or are planning to become pregnant.  If you drink alcohol: ? Limit how much you use to 0-1 drink a day. ? Limit intake if you are breastfeeding.  Be aware of how much alcohol is in your drink. In the [place] one drink equals one 12 oz bottle of beer (355 mL), one 5 oz glass of wine (148 mL), or one 1 oz glass of hard liquor (44 mL). General instructions  Schedule regular health, dental, and eye exams.  Stay current with your vaccines.  Tell your health care provider if: ? You often feel depressed. ? You have ever been abused or do not feel safe at home. Summary  Adopting a healthy lifestyle and getting preventive care are important in promoting health and wellness.  Follow your health care provider's instructions about healthy diet, exercising, and getting tested or screened for diseases.  Follow your health care provider's instructions on monitoring your cholesterol and blood pressure. This information is not intended to replace  advice given to you by your health care provider. Make sure you discuss any questions you have with your health care provider. Document Revised: 06/16/2018 Document Reviewed: 06/16/2018 Elsevier Patient Education  2020 Elsevier  Inc.     Neta Mends. Trysta Showman M.D.

## 2019-09-19 ENCOUNTER — Ambulatory Visit (INDEPENDENT_AMBULATORY_CARE_PROVIDER_SITE_OTHER): Payer: Medicare Other | Admitting: Internal Medicine

## 2019-09-19 ENCOUNTER — Other Ambulatory Visit: Payer: Self-pay

## 2019-09-19 ENCOUNTER — Encounter: Payer: Self-pay | Admitting: Internal Medicine

## 2019-09-19 VITALS — BP 160/80 | HR 95 | Temp 98.0°F | Ht 61.5 in | Wt 112.0 lb

## 2019-09-19 DIAGNOSIS — Z79899 Other long term (current) drug therapy: Secondary | ICD-10-CM

## 2019-09-19 DIAGNOSIS — M899 Disorder of bone, unspecified: Secondary | ICD-10-CM

## 2019-09-19 DIAGNOSIS — E785 Hyperlipidemia, unspecified: Secondary | ICD-10-CM | POA: Diagnosis not present

## 2019-09-19 DIAGNOSIS — M949 Disorder of cartilage, unspecified: Secondary | ICD-10-CM

## 2019-09-19 DIAGNOSIS — E039 Hypothyroidism, unspecified: Secondary | ICD-10-CM

## 2019-09-19 DIAGNOSIS — R030 Elevated blood-pressure reading, without diagnosis of hypertension: Secondary | ICD-10-CM

## 2019-09-19 DIAGNOSIS — Z0000 Encounter for general adult medical examination without abnormal findings: Secondary | ICD-10-CM | POA: Diagnosis not present

## 2019-09-19 DIAGNOSIS — H9193 Unspecified hearing loss, bilateral: Secondary | ICD-10-CM

## 2019-09-19 LAB — CBC WITH DIFFERENTIAL/PLATELET
Basophils Absolute: 0 10*3/uL (ref 0.0–0.1)
Basophils Relative: 0.5 % (ref 0.0–3.0)
Eosinophils Absolute: 0 10*3/uL (ref 0.0–0.7)
Eosinophils Relative: 0.3 % (ref 0.0–5.0)
HCT: 40 % (ref 36.0–46.0)
Hemoglobin: 13.7 g/dL (ref 12.0–15.0)
Lymphocytes Relative: 15.9 % (ref 12.0–46.0)
Lymphs Abs: 0.8 10*3/uL (ref 0.7–4.0)
MCHC: 34.1 g/dL (ref 30.0–36.0)
MCV: 103.8 fl — ABNORMAL HIGH (ref 78.0–100.0)
Monocytes Absolute: 0.3 10*3/uL (ref 0.1–1.0)
Monocytes Relative: 5 % (ref 3.0–12.0)
Neutro Abs: 4 10*3/uL (ref 1.4–7.7)
Neutrophils Relative %: 78.3 % — ABNORMAL HIGH (ref 43.0–77.0)
Platelets: 235 10*3/uL (ref 150.0–400.0)
RBC: 3.85 Mil/uL — ABNORMAL LOW (ref 3.87–5.11)
RDW: 13.4 % (ref 11.5–15.5)
WBC: 5.1 10*3/uL (ref 4.0–10.5)

## 2019-09-19 LAB — LIPID PANEL
Cholesterol: 194 mg/dL (ref 0–200)
HDL: 55.5 mg/dL (ref >39.00)
LDL Cholesterol: 125 mg/dL — ABNORMAL HIGH (ref 0–99)
NonHDL: 138.36
Total CHOL/HDL Ratio: 3
Triglycerides: 66 mg/dL (ref 0.0–149.0)
VLDL: 13.2 mg/dL (ref 0.0–40.0)

## 2019-09-19 LAB — VITAMIN D 25 HYDROXY (VIT D DEFICIENCY, FRACTURES): VITD: 37.02 ng/mL (ref 30.00–100.00)

## 2019-09-19 LAB — BASIC METABOLIC PANEL
BUN: 14 mg/dL (ref 6–23)
CO2: 27 mEq/L (ref 19–32)
Calcium: 9.7 mg/dL (ref 8.4–10.5)
Chloride: 101 mEq/L (ref 96–112)
Creatinine, Ser: 0.62 mg/dL (ref 0.40–1.20)
GFR: 92.06 mL/min (ref >60.00)
Glucose, Bld: 108 mg/dL — ABNORMAL HIGH (ref 70–99)
Potassium: 4.7 mEq/L (ref 3.5–5.1)
Sodium: 137 mEq/L (ref 135–145)

## 2019-09-19 LAB — HEPATIC FUNCTION PANEL
ALT: 25 U/L (ref 0–35)
AST: 26 U/L (ref 0–37)
Albumin: 4.4 g/dL (ref 3.5–5.2)
Alkaline Phosphatase: 63 U/L (ref 39–117)
Bilirubin, Direct: 0.1 mg/dL (ref 0.0–0.3)
Total Bilirubin: 0.6 mg/dL (ref 0.2–1.2)
Total Protein: 7.3 g/dL (ref 6.0–8.3)

## 2019-09-19 LAB — T4, FREE: Free T4: 1.26 ng/dL (ref 0.60–1.60)

## 2019-09-19 LAB — TSH: TSH: 1.01 u[IU]/mL (ref 0.35–4.50)

## 2019-09-19 NOTE — Patient Instructions (Addendum)
Please check your BP readings at home every 1-2 weeks to make sure   In range  Below 140/90 would be best .   Will notify of labs when available.   Keep up exercise  Vit d   1000 - 4000 IU per day  For bone health Strongly advise  Check in to hearing assistance   To help with communication   .   Health Maintenance, Female Adopting a healthy lifestyle and getting preventive care are important in promoting health and wellness. Ask your health care provider about:  The right schedule for you to have regular tests and exams.  Things you can do on your own to prevent diseases and keep yourself healthy. What should I know about diet, weight, and exercise? Eat a healthy diet   Eat a diet that includes plenty of vegetables, fruits, low-fat dairy products, and lean protein.  Do not eat a lot of foods that are high in solid fats, added sugars, or sodium. Maintain a healthy weight Body mass index (BMI) is used to identify weight problems. It estimates body fat based on height and weight. Your health care provider can help determine your BMI and help you achieve or maintain a healthy weight. Get regular exercise Get regular exercise. This is one of the most important things you can do for your health. Most adults should:  Exercise for at least 150 minutes each week. The exercise should increase your heart rate and make you sweat (moderate-intensity exercise).  Do strengthening exercises at least twice a week. This is in addition to the moderate-intensity exercise.  Spend less time sitting. Even light physical activity can be beneficial. Watch cholesterol and blood lipids Have your blood tested for lipids and cholesterol at 83 years of age, then have this test every 5 years. Have your cholesterol levels checked more often if:  Your lipid or cholesterol levels are high.  You are older than 83 years of age.  You are at high risk for heart disease. What should I know about cancer  screening? Depending on your health history and family history, you may need to have cancer screening at various ages. This may include screening for:  Breast cancer.  Cervical cancer.  Colorectal cancer.  Skin cancer.  Lung cancer. What should I know about heart disease, diabetes, and high blood pressure? Blood pressure and heart disease  High blood pressure causes heart disease and increases the risk of stroke. This is more likely to develop in people who have high blood pressure readings, are of African descent, or are overweight.  Have your blood pressure checked: ? Every 3-5 years if you are 56-61 years of age. ? Every year if you are 101 years old or older. Diabetes Have regular diabetes screenings. This checks your fasting blood sugar level. Have the screening done:  Once every three years after age 38 if you are at a normal weight and have a low risk for diabetes.  More often and at a younger age if you are overweight or have a high risk for diabetes. What should I know about preventing infection? Hepatitis B If you have a higher risk for hepatitis B, you should be screened for this virus. Talk with your health care provider to find out if you are at risk for hepatitis B infection. Hepatitis C Testing is recommended for:  Everyone born from [birth year] through 1963.  Anyone with known risk factors for hepatitis C. Sexually transmitted infections (STIs)  Get screened for STIs,  including gonorrhea and chlamydia, if: ? You are sexually active and are younger than 83 years of age. ? You are older than 83 years of age and your health care provider tells you that you are at risk for this type of infection. ? Your sexual activity has changed since you were last screened, and you are at increased risk for chlamydia or gonorrhea. Ask your health care provider if you are at risk.  Ask your health care provider about whether you are at high risk for HIV. Your health care provider may  recommend a prescription medicine to help prevent HIV infection. If you choose to take medicine to prevent HIV, you should first get tested for HIV. You should then be tested every 3 months for as long as you are taking the medicine. Pregnancy  If you are about to stop having your period (premenopausal) and you may become pregnant, seek counseling before you get pregnant.  Take 400 to 800 micrograms (mcg) of folic acid every day if you become pregnant.  Ask for birth control (contraception) if you want to prevent pregnancy. Osteoporosis and menopause Osteoporosis is a disease in which the bones lose minerals and strength with aging. This can result in bone fractures. If you are 35 years old or older, or if you are at risk for osteoporosis and fractures, ask your health care provider if you should:  Be screened for bone loss.  Take a calcium or vitamin D supplement to lower your risk of fractures.  Be given hormone replacement therapy (HRT) to treat symptoms of menopause. Follow these instructions at home: Lifestyle  Do not use any products that contain nicotine or tobacco, such as cigarettes, e-cigarettes, and chewing tobacco. If you need help quitting, ask your health care provider.  Do not use street drugs.  Do not share needles.  Ask your health care provider for help if you need support or information about quitting drugs. Alcohol use  Do not drink alcohol if: ? Your health care provider tells you not to drink. ? You are pregnant, may be pregnant, or are planning to become pregnant.  If you drink alcohol: ? Limit how much you use to 0-1 drink a day. ? Limit intake if you are breastfeeding.  Be aware of how much alcohol is in your drink. In the [place] one drink equals one 12 oz bottle of beer (355 mL), one 5 oz glass of wine (148 mL), or one 1 oz glass of hard liquor (44 mL). General instructions  Schedule regular health, dental, and eye exams.  Stay current with your  vaccines.  Tell your health care provider if: ? You often feel depressed. ? You have ever been abused or do not feel safe at home. Summary  Adopting a healthy lifestyle and getting preventive care are important in promoting health and wellness.  Follow your health care provider's instructions about healthy diet, exercising, and getting tested or screened for diseases.  Follow your health care provider's instructions on monitoring your cholesterol and blood pressure. This information is not intended to replace advice given to you by your health care provider. Make sure you discuss any questions you have with your health care provider. Document Revised: 06/16/2018 Document Reviewed: 06/16/2018 Elsevier Patient Education  2020 [employer].

## 2019-09-21 NOTE — Progress Notes (Signed)
Thyroid in range stay on same dose  Blood sugar borderline  the same  Cholesterol the same  Vit d is normal range now

## 2019-10-24 ENCOUNTER — Other Ambulatory Visit: Payer: Self-pay | Admitting: Internal Medicine

## 2019-11-02 ENCOUNTER — Telehealth: Payer: Self-pay | Admitting: Internal Medicine

## 2019-11-02 NOTE — Telephone Encounter (Signed)
I called [facility] at [phone] and spoke to a pharmacist and he stated that the Synthroid 50 mcg has already been processed and is ready to ship to the patient. He said it could have been the timing when she called they may have not received the approval yet.   I called the patient and let her know and she verbalized an understanding.

## 2019-11-02 NOTE — Telephone Encounter (Signed)
Patient needs a PA for this medication   SYNTHROID 50 MCG tablet  [facility]) [facility] - [place], [place] - [address] Phone:  [phone]  Fax:  [phone]

## 2019-11-02 NOTE — Telephone Encounter (Signed)
Pt states she called her Pharmacy to refill her Synthroid and they told her the PA was not approved yet. Pt is wondering what she needs to do b/c she was informed by CMA that it was approved.  She also wants to know if it is approved-does she need to notify them again or will it be automatic to the pharmacy?   Pt can be reached at [phone]

## 2019-11-02 NOTE — Telephone Encounter (Signed)
Prior authorization was completed on 11/01/2019 Key # [id number] Approved from 11/01/2019 through 10/31/2020  Called patient and let her know that the PA has been approved and for her to reach out to [facility] to make sure they send now that her PA has been approved. Patient verbalized an understanding.

## 2019-11-03 NOTE — Telephone Encounter (Signed)
Leta Jungling from [facility] called and stated the Synthroid is approved for one year starting April 27th, 2021

## 2020-03-09 ENCOUNTER — Encounter: Payer: Self-pay | Admitting: Internal Medicine

## 2020-04-25 ENCOUNTER — Telehealth: Payer: Self-pay | Admitting: Internal Medicine

## 2020-04-25 NOTE — Telephone Encounter (Signed)
NO ANSWER/NO VM. Pt due to schedule Medicare Annual Wellness Visit (AWV) either virtually or in office.  Last AWV 12/20/14; please schedule at anytime with [facility] Nurse Health Advisor 2.  This should be a 45 minute visit.

## 2020-05-17 ENCOUNTER — Ambulatory Visit: Payer: Medicare Other | Admitting: Internal Medicine

## 2020-07-05 ENCOUNTER — Telehealth: Payer: Self-pay | Admitting: Internal Medicine

## 2020-07-05 NOTE — Telephone Encounter (Signed)
Left message for patient to call back and schedule Medicare Annual Wellness Visit (AWV) either virtually or in office.   Last AWV 12/20/14  please schedule at anytime with [facility] Nurse Health Advisor 1 or 2   This should be a 45 minute visit.

## 2020-07-26 NOTE — Progress Notes (Signed)
++   Cardiology Office Note   Date:  07/31/2020   ID:  Natasha Harper, DOB 04/18/1935, MRN [id number]  PCP:  [name], MD  Cardiologist:   Dietrich Pates, MD   F/U  of CAD and hypertension    History of Present Illness: Natasha Harper is a 86 y.o. female with a history of CAD  Remote intervention in 1993 (a PCI to the RCA and diagonal; residual 70% lesion parentheses.  Stress test in two thousand seven and 2013  Aslo a nistory of mod CV dz, HL and hypertension (whitecoat). I saw the pt in 2019  Since seen she denies CP   Breathing is OK   Walking regularly    NO dizziness  No TIA like symptoms BP at home   126 to 130/    Current Meds  Medication Sig  . Ascorbic Acid (VITAMIN C) 100 MG tablet Take 100 mg by mouth daily.  Marland Kitchen aspirin 81 MG tablet Take 81 mg by mouth daily.  Marland Kitchen atorvastatin (LIPITOR) 80 MG tablet TAKE 1 TABLET BY MOUTH DAILY  . fish oil-omega-3 fatty acids 1000 MG capsule Take 2 g by mouth daily.  . MULTIPLE VITAMIN PO Take 1 tablet by mouth daily.  Marland Kitchen SYNTHROID 50 MCG tablet TAKE 1 TABLET BY MOUTH DAILY     Allergies:   Actonel [risedronate sodium], Evista [raloxifene], and Fosamax [alendronate sodium]   Past Medical History:  Diagnosis Date  . CAD (coronary artery disease)    PCI to RCA and diagonal in remote past, residual 70% LAD  /   nuclear, 2007, no ischemia  . Carotid arterial disease (HCC)    Doppler, June, 2011, stable, 60-79% R. ICA, 40-59% LICA  . Contact lens/glasses fitting    wears contacts or glasses  . Dyslipidemia    Significant drop in LDL from Lipitor even though LDL remains high  . Ejection fraction    EF 65%, nuclear, 2007  . Fibrocystic breast   . HTN (hypertension)     no med  . Hypercholesterolemia   . Hypothyroidism    Thyroid surgery in the past, thyroid nodules followed by Dr.Ellison  . Lump or mass in breast 07/19/2007   Excised 01/12/13. B9 on pathQualifier: Diagnosis of  By: Fabian Sharp MD, Neta Mends    . Osteoporosis   .  Prominent abdominal aortic pulsation    No abdominal aneurysm by ultrasound  . Rectal fissure     Past Surgical History:  Procedure Laterality Date  . BREAST BIOPSY Right 01/12/2013   Procedure: Removal of right breast mass;  Surgeon: Currie Paris, MD;  Location: [facility];  Service: General;  Laterality: Right;  . BREAST EXCISIONAL BIOPSY  9/14   scar tissue from the needle biopsy  . CORONARY ANGIOPLASTY  1993  . RECTOPERITONEAL FISTULA CLOSURE    . THYROID CYST EXCISION       Social History:  The patient  reports that she quit smoking about 62 years ago. She has never used smokeless tobacco. She reports that she does not drink alcohol and does not use drugs.   Family History:  The patient's family history includes Breast cancer in her daughter; Heart attack in her father; Osteoporosis in her mother.    ROS:  Please see the history of present illness. All other systems are reviewed and  Negative to the above problem except as noted.    PHYSICAL EXAM: VS:  BP (!) 192/100   Pulse 91  Ht 5\' 3"  (1.6 m)   Wt 116 lb 9.6 oz (52.9 kg)   LMP 07/07/1997   BMI 20.65 kg/m   09/04/1997 86 yo in no acute distress  HEENT: normal  Neck: JVP is normal    Cardiac: RRR; no murmurs  No LE  edema   Promnent veins   Respiratory:  clear to auscultation bilaterally GI: soft, nontender, nondistended, + BS  No hepatomegaly  MS: no deformity Moving all extremities   Skin: warm and dry, no rash Neuro:  Strength and sensation are intact Psych: euthymic mood, full affect   EKG:  EKG is  ordered today.  SR91 bpm     Lipid Panel    Component Value Date/Time   CHOL 194 09/19/2019 1041   TRIG 66.0 09/19/2019 1041   HDL 55.50 09/19/2019 1041   CHOLHDL 3 09/19/2019 1041   VLDL 13.2 09/19/2019 1041   LDLCALC 125 (H) 09/19/2019 1041   LDLDIRECT 152.1 01/16/2010 1015      Wt Readings from Last 3 Encounters:  07/31/20 116 lb 9.6 oz (52.9 kg)  09/19/19 112 lb (50.8 kg)   12/01/17 114 lb 1.9 oz (51.8 kg)      ASSESSMENT AND PLAN:  1  HTN patient's blood pressure today in clinic is severely elevated.  She says at home it is reading in the 120s to 130s.  I have stressed to her the importance of taking her cuff to Dr. 12/03/17 office so that we make sure that it is accurate and reading those numbers out.  She has a long history of whitecoat hypertension and again she says she is anxious because she has not been here in a couple of years.  . 2  CAD remote intervention.  Myoview after that showed no ischemia.  Patient fairly active for her age.  No symtpoms to sugg angina follow-up.  3  CV dz    Remains mild.  Will set up for carotid ultrasound.  4  HL LDL has been higher than it supposed to be.  Longstanding.  She is due to get lipids soon with Dr. Rosezella Florida.  She ate today so I would not check.  Plan for follow-up in 1 year.    Signed, Fabian Sharp, MD  07/31/2020 9:36 AM    [facility] [address] [place], [place], [place]  [place] Phone: [phone]; Fax: [phone]

## 2020-07-31 ENCOUNTER — Other Ambulatory Visit: Payer: Self-pay

## 2020-07-31 ENCOUNTER — Ambulatory Visit: Payer: Medicare Other | Admitting: Internal Medicine

## 2020-07-31 ENCOUNTER — Encounter: Payer: Self-pay | Admitting: Internal Medicine

## 2020-07-31 VITALS — BP 192/100 | HR 91 | Ht 63.0 in | Wt 116.6 lb

## 2020-07-31 DIAGNOSIS — I6523 Occlusion and stenosis of bilateral carotid arteries: Secondary | ICD-10-CM | POA: Diagnosis not present

## 2020-07-31 NOTE — Patient Instructions (Signed)
Medication Instructions:  No changes *If you need a refill on your cardiac medications before your next appointment, please call your pharmacy*   Lab Work: Take lab slip with you to Dr. Rosezella Florida office to get blood work   Testing/Procedures: Your physician has requested that you have a carotid duplex. This test is an ultrasound of the carotid arteries in your neck. It looks at blood flow through these arteries that supply the brain with blood. Allow one hour for this exam. There are no restrictions or special instructions.   Follow-Up: At [facility], you and your health needs are our priority.  As part of our continuing mission to provide you with exceptional heart care, we have created designated Provider Care Teams.  These Care Teams include your primary Cardiologist (physician) and Advanced Practice Providers (APPs -  Physician Assistants and Nurse Practitioners) who all work together to provide you with the care you need, when you need it.   Your next appointment:   12 month(s)  The format for your next appointment:   In Person  Provider:   You may see Dietrich Pates, MD or one of the following Advanced Practice Providers on your designated Care Team:    Tereso Newcomer, PA-C  Chelsea Aus, [place]   Other Instructions

## 2020-08-06 ENCOUNTER — Ambulatory Visit (HOSPITAL_COMMUNITY)
Admission: RE | Admit: 2020-08-06 | Discharge: 2020-08-06 | Disposition: A | Discharge status: Home / Self Care | Payer: Medicare Other | Source: Ambulatory Visit | Attending: Internal Medicine | Admitting: Internal Medicine

## 2020-08-06 ENCOUNTER — Telehealth: Payer: Self-pay

## 2020-08-06 ENCOUNTER — Other Ambulatory Visit: Payer: Self-pay

## 2020-08-06 DIAGNOSIS — I6523 Occlusion and stenosis of bilateral carotid arteries: Secondary | ICD-10-CM

## 2020-08-06 NOTE — Telephone Encounter (Signed)
Pt erbalized understanding and agreed to carotid in one year.

## 2020-08-06 NOTE — Telephone Encounter (Signed)
-----   Message from Pricilla Riffle, MD sent at 08/06/2020  3:06 PM EST ----- Mild plaquing of R carotid    No new recommendations   I would not keep on automatic follow up

## 2020-09-05 ENCOUNTER — Telehealth: Payer: Self-pay | Admitting: Internal Medicine

## 2020-09-05 NOTE — Telephone Encounter (Signed)
Left message for patient to call back and schedule Medicare Annual Wellness Visit (AWV) either virtually or in office. No detailed message  Last AWV 12/20/14  please schedule at anytime with [facility] Nurse Health Advisor 1 or 2   This should be a 45 minute visit.

## 2020-10-22 ENCOUNTER — Other Ambulatory Visit: Payer: Self-pay

## 2020-10-22 ENCOUNTER — Encounter: Payer: Self-pay | Admitting: Internal Medicine

## 2020-10-22 ENCOUNTER — Ambulatory Visit (INDEPENDENT_AMBULATORY_CARE_PROVIDER_SITE_OTHER): Payer: Medicare Other | Admitting: Internal Medicine

## 2020-10-22 VITALS — BP 160/80 | HR 91 | Temp 97.6°F | Ht 61.5 in | Wt 115.0 lb

## 2020-10-22 DIAGNOSIS — E039 Hypothyroidism, unspecified: Secondary | ICD-10-CM | POA: Diagnosis not present

## 2020-10-22 DIAGNOSIS — Z79899 Other long term (current) drug therapy: Secondary | ICD-10-CM | POA: Diagnosis not present

## 2020-10-22 DIAGNOSIS — H9193 Unspecified hearing loss, bilateral: Secondary | ICD-10-CM | POA: Diagnosis not present

## 2020-10-22 DIAGNOSIS — Z0000 Encounter for general adult medical examination without abnormal findings: Secondary | ICD-10-CM | POA: Diagnosis not present

## 2020-10-22 DIAGNOSIS — N6029 Fibroadenosis of unspecified breast: Secondary | ICD-10-CM

## 2020-10-22 DIAGNOSIS — E538 Deficiency of other specified B group vitamins: Secondary | ICD-10-CM | POA: Diagnosis not present

## 2020-10-22 DIAGNOSIS — E785 Hyperlipidemia, unspecified: Secondary | ICD-10-CM

## 2020-10-22 DIAGNOSIS — R030 Elevated blood-pressure reading, without diagnosis of hypertension: Secondary | ICD-10-CM

## 2020-10-22 DIAGNOSIS — I779 Disorder of arteries and arterioles, unspecified: Secondary | ICD-10-CM

## 2020-10-22 LAB — CBC WITH DIFFERENTIAL/PLATELET
Basophils Absolute: 0 10*3/uL (ref 0.0–0.1)
Basophils Relative: 0.4 % (ref 0.0–3.0)
Eosinophils Absolute: 0 10*3/uL (ref 0.0–0.7)
Eosinophils Relative: 0.1 % (ref 0.0–5.0)
HCT: 40.6 % (ref 36.0–46.0)
Hemoglobin: 13.6 g/dL (ref 12.0–15.0)
Lymphocytes Relative: 16.9 % (ref 12.0–46.0)
Lymphs Abs: 0.8 10*3/uL (ref 0.7–4.0)
MCHC: 33.5 g/dL (ref 30.0–36.0)
MCV: 103.2 fl — ABNORMAL HIGH (ref 78.0–100.0)
Monocytes Absolute: 0.3 10*3/uL (ref 0.1–1.0)
Monocytes Relative: 5.4 % (ref 3.0–12.0)
Neutro Abs: 3.7 10*3/uL (ref 1.4–7.7)
Neutrophils Relative %: 77.2 % — ABNORMAL HIGH (ref 43.0–77.0)
Platelets: 227 10*3/uL (ref 150.0–400.0)
RBC: 3.94 Mil/uL (ref 3.87–5.11)
RDW: 13.1 % (ref 11.5–15.5)
WBC: 4.8 10*3/uL (ref 4.0–10.5)

## 2020-10-22 LAB — BASIC METABOLIC PANEL
BUN: 13 mg/dL (ref 6–23)
CO2: 27 mEq/L (ref 19–32)
Calcium: 9.4 mg/dL (ref 8.4–10.5)
Chloride: 101 mEq/L (ref 96–112)
Creatinine, Ser: 0.63 mg/dL (ref 0.40–1.20)
GFR: 81.99 mL/min (ref >60.00)
Glucose, Bld: 100 mg/dL — ABNORMAL HIGH (ref 70–99)
Potassium: 4.3 mEq/L (ref 3.5–5.1)
Sodium: 137 mEq/L (ref 135–145)

## 2020-10-22 LAB — LIPID PANEL
Cholesterol: 178 mg/dL (ref 0–200)
HDL: 57.5 mg/dL (ref >39.00)
LDL Cholesterol: 107 mg/dL — ABNORMAL HIGH (ref 0–99)
NonHDL: 120.97
Total CHOL/HDL Ratio: 3
Triglycerides: 68 mg/dL (ref 0.0–149.0)
VLDL: 13.6 mg/dL (ref 0.0–40.0)

## 2020-10-22 LAB — HEPATIC FUNCTION PANEL
ALT: 24 U/L (ref 0–35)
AST: 26 U/L (ref 0–37)
Albumin: 4.2 g/dL (ref 3.5–5.2)
Alkaline Phosphatase: 59 U/L (ref 39–117)
Bilirubin, Direct: 0.1 mg/dL (ref 0.0–0.3)
Total Bilirubin: 0.6 mg/dL (ref 0.2–1.2)
Total Protein: 7.1 g/dL (ref 6.0–8.3)

## 2020-10-22 LAB — TSH: TSH: 0.91 u[IU]/mL (ref 0.35–4.50)

## 2020-10-22 NOTE — Progress Notes (Signed)
Chief Complaint  Patient presents with  . Annual Exam    HPI: Natasha Harper 86 y.o. comes in today for Preventive Medicare exam and medication evaluation..  Dr. Tenny Craw request blood pressure machine comparison and lab work to include lipid BMP CBC.  She states her blood pressure always rises in the doctor's office has brought her machine today.  Readings at home include 135/72 pulse 90 139/74 pulse 84 134/74 pulse 91 1 reading was 141.  No current chest pain shortness of breath  Did have a little down mood previously but getting out walking.  No injury or fall.  Has lumpy breasts and is followed with mammograms.  Health Maintenance  Topic Date Due  . DEXA SCAN  Never done  . COVID-19 Vaccine (4 - Booster for Pfizer series) 10/23/2020  . INFLUENZA VACCINE  02/04/2021  . TETANUS/TDAP  12/19/2024  . PNA vac Low Risk Adult  Completed  . HPV VACCINES  Aged Out   Health Maintenance Review LIFESTYLE:  Exercise:  Walk on most  Days  Tobacco/ETS:n Alcohol: Occasional  Sleep: 7 to 8 hours nocturia x2 Drug use: no HH: 2 cat    Hearing:  The same as decreased hearing  Vision:  No limitations at present . Last eye check UTD  Safety:  Has smoke detector and wears seat belts.  No firearms. No excess sun exposure. Sees dentist regularly.  Falls: No  Memory: Felt to be good  , no concern from her or her family.  Depression: No anhedonia unusual crying or depressive symptoms  Nutrition: Eats well balanced diet; adequate calcium and vitamin D.  Takes over-the-counter multivitamin with D no swallowing chewing problems.  Injury: no major injuries in the last six months.  Other healthcare providers:  Reviewed today .   Preventive parameters: up-to-date  Reviewed has had DEXA scan has declined bone building therapy in the past  ADLS:   There are no problems or need for assistance  driving, feeding, obtaining food, dressing, toileting and bathing, managing money using phone.  She is independent.    ROS:  GEN/ HEENT: No fever, significant weight changes sweats headaches vision problems hearing changes, CV/ PULM; No chest pain shortness of breath cough, syncope,edema  change in exercise tolerance. GI /GU: No adominal pain, vomiting, change in bowel habits. No blood in the stool. No significant GU symptoms. SKIN/HEME: ,no acute skin rashes suspicious lesions or bleeding. No lymphadenopathy, nodules, masses.  NEURO/ PSYCH:  No neurologic signs such as weakness numbness. No depression anxiety. IMM/ Allergy: No unusual infections.  Allergy .   REST of 12 system review negative except as per HPI   Past Medical History:  Diagnosis Date  . CAD (coronary artery disease)    PCI to RCA and diagonal in remote past, residual 70% LAD  /   nuclear, 2007, no ischemia  . Carotid arterial disease (HCC)    Doppler, June, 2011, stable, 60-79% R. ICA, 40-59% LICA  . Contact lens/glasses fitting    wears contacts or glasses  . Dyslipidemia    Significant drop in LDL from Lipitor even though LDL remains high  . Ejection fraction    EF 65%, nuclear, 2007  . Fibrocystic breast   . HTN (hypertension)     no med  . Hypercholesterolemia   . Hypothyroidism    Thyroid surgery in the past, thyroid nodules followed by Dr.Ellison  . Lump or mass in breast 07/19/2007   Excised 01/12/13. B9 on pathQualifier: Diagnosis of  ByFabian Sharp MD, Neta Mends    . Osteoporosis   . Prominent abdominal aortic pulsation    No abdominal aneurysm by ultrasound  . Rectal fissure     Family History  Problem Relation Age of Onset  . Heart attack Father   . Osteoporosis Mother   . Breast cancer Daughter     Social History   Socioeconomic History  . Marital status: Married    Spouse name: Not on file  . Number of children: Not on file  . Years of education: Not on file  . Highest education level: Not on file  Occupational History  . Not on file  Tobacco Use  . Smoking status: Former Smoker     Quit date: 07/07/1958    Years since quitting: 62.3  . Smokeless tobacco: Never Used  Substance and Sexual Activity  . Alcohol use: No  . Drug use: No  . Sexual activity: Yes    Partners: Male  Other Topics Concern  . Not on file  Social History Narrative   HH     Of 2  No pets    Family from [place]    Exercise   Walks every day at least 30 minutes  2 miles .   Sleep  Ok 10 - 7    Just moved to [place] [employer] Strain: Not on file  Food Insecurity: Not on file  Transportation Needs: Not on file  Physical Activity: Not on file  Stress: Not on file  Social Connections: Not on file    Outpatient Encounter Medications as of 10/22/2020  Medication Sig  . Ascorbic Acid (VITAMIN C) 100 MG tablet Take 100 mg by mouth daily.  Marland Kitchen aspirin 81 MG tablet Take 81 mg by mouth daily.  Marland Kitchen atorvastatin (LIPITOR) 80 MG tablet TAKE 1 TABLET BY MOUTH DAILY  . fish oil-omega-3 fatty acids 1000 MG capsule Take 2 g by mouth daily.  . MULTIPLE VITAMIN PO Take 1 tablet by mouth daily.  Marland Kitchen SYNTHROID 50 MCG tablet TAKE 1 TABLET BY MOUTH DAILY   No facility-administered encounter medications on file as of 10/22/2020.    EXAM:  BP (!) 160/80 (BP Location: Left Arm, Patient Position: Sitting, Cuff Size: Normal)   Pulse 91   Temp 97.6 F (36.4 C) (Oral)   Ht 5' 1.5" (1.562 m)   Wt 115 lb (52.2 kg)   LMP 07/07/1997   SpO2 96%   BMI 21.38 kg/m  BP 172/70 88 office   176/91 88 patient monitor  Body mass index is 21.38 kg/m. Blood pressure right arm sitting office cuff 172/70 pulse 88 patient machine 176/91 pulse 88. Physical Exam: Vital signs reviewed GNF:AOZH is a well-developed well-nourished alert cooperative   who appears stated age in no acute distress.  Hard of hearing normal cognition. HEENT: normocephalic atraumatic , Eyes: PERRL EOM's full, conjunctiva clear, Nares: paten,t no deformity discharge or tenderness., Ears: no deformity EAC's clear  TMs with normal landmarks. Mouth: Masked NECK: supple without masses, thyromegaly right carotid bruit CHEST/PULM:  Clear to auscultation and percussion breath sounds equal no wheeze , rales or rhonchi. No chest wall deformities or tenderness. CV: PMI is nondisplaced, S1 S2 no gallops, murmurs, rubs. Peripheral pulses present without delay.No JVD .  Breast multiple lumps right more than left no dimpling or pain (patient said this is normal for her") ABDOMEN: Bowel sounds normal nontender  No guard or rebound, no hepato  splenomegal no CVA tenderness.  Abdominal bruit systolic only heard Extremtities:  No clubbing cyanosis or edema, no acute joint swelling or redness no focal atrophy some DJD changes bunions left more than right no ulcers. NEURO:  Oriented x3, cranial nerves 3-12 appear to be intact, no obvious focal weakness,gait within normal limits no abnormal reflexes or asymmetrical SKIN: No acute rashes normal turgor, color, no bruising or petechiae. PSYCH: Oriented, good eye contact, no obvious depression anxiety, cognition and judgment appear normal. LN: no cervical axillary adenopathy No noted deficits in memory, attention, and speech.   Lab Results  Component Value Date   WBC 4.8 10/22/2020   HGB 13.6 10/22/2020   HCT 40.6 10/22/2020   PLT 227.0 10/22/2020   GLUCOSE 100 (H) 10/22/2020   CHOL 178 10/22/2020   TRIG 68.0 10/22/2020   HDL 57.50 10/22/2020   LDLDIRECT 152.1 01/16/2010   LDLCALC 107 (H) 10/22/2020   ALT 24 10/22/2020   AST 26 10/22/2020   NA 137 10/22/2020   K 4.3 10/22/2020   CL 101 10/22/2020   CREATININE 0.63 10/22/2020   BUN 13 10/22/2020   CO2 27 10/22/2020   TSH 0.91 10/22/2020   HGBA1C 5.6 11/25/2017    ASSESSMENT AND PLAN:  Discussed the following assessment and plan:  Visit for preventive health examination  Medication management - Plan: Basic metabolic panel, CBC with Differential/Platelet, Hepatic function panel, Lipid panel, TSH, TSH, Lipid  panel, Hepatic function panel, CBC with Differential/Platelet, Basic metabolic panel  Hypothyroidism, unspecified type - Plan: Basic metabolic panel, CBC with Differential/Platelet, Hepatic function panel, Lipid panel, TSH, TSH, Lipid panel, Hepatic function panel, CBC with Differential/Platelet, Basic metabolic panel  HYPERTENSION, WHITE COAT  Decreased hearing of both ears - Plan: Basic metabolic panel, CBC with Differential/Platelet, Hepatic function panel, Lipid panel, TSH, TSH, Lipid panel, Hepatic function panel, CBC with Differential/Platelet, Basic metabolic panel  Dyslipidemia - Plan: Basic metabolic panel, CBC with Differential/Platelet, Hepatic function panel, Lipid panel, TSH, TSH, Lipid panel, Hepatic function panel, CBC with Differential/Platelet, Basic metabolic panel  Bilateral carotid artery disease, unspecified type (HCC) - Plan: Basic metabolic panel, CBC with Differential/Platelet, Hepatic function panel, Lipid panel, TSH, TSH, Lipid panel, Hepatic function panel, CBC with Differential/Platelet, Basic metabolic panel  Lumpy breasts, unspecified laterality It appears that her home monitor is correlates with in office readings. Her home readings are in the high 130 range average as she reports. Suggest she take twice daily readings 3 days in a row and document for [place].  We will send information to Dr. Tenny Craw. Patient Care Team: Alvah Gilder, Neta Mends, MD as PCP - General Pricilla Riffle, MD as PCP - Cardiology (Cardiology) Jerene Bears, MD (Gynecology) Pricilla Riffle, MD as Consulting Physician (Cardiology) Jethro Bolus, MD as Consulting Physician (Ophthalmology)  Patient Instructions   Will notify you  of labs when available. And will send to Dr Tenny Craw.  Continue BP monitoring  3 days in a row  When at home  It seem that your monitor  Reads close to  Our office reading today .  Will send info to dr Tenny Craw.   Fu with mammograms for the lumpy breast .     Health Maintenance,  Female Adopting a healthy lifestyle and getting preventive care are important in promoting health and wellness. Ask your health care provider about:  The right schedule for you to have regular tests and exams.  Things you can do on your own to prevent diseases and keep yourself healthy. What  should I know about diet, weight, and exercise? Eat a healthy diet  Eat a diet that includes plenty of vegetables, fruits, low-fat dairy products, and lean protein.  Do not eat a lot of foods that are high in solid fats, added sugars, or sodium.   Maintain a healthy weight Body mass index (BMI) is used to identify weight problems. It estimates body fat based on height and weight. Your health care provider can help determine your BMI and help you achieve or maintain a healthy weight. Get regular exercise Get regular exercise. This is one of the most important things you can do for your health. Most adults should:  Exercise for at least 150 minutes each week. The exercise should increase your heart rate and make you sweat (moderate-intensity exercise).  Do strengthening exercises at least twice a week. This is in addition to the moderate-intensity exercise.  Spend less time sitting. Even light physical activity can be beneficial. Watch cholesterol and blood lipids Have your blood tested for lipids and cholesterol at 84 years of age, then have this test every 5 years. Have your cholesterol levels checked more often if:  Your lipid or cholesterol levels are high.  You are older than 84 years of age.  You are at high risk for heart disease. What should I know about cancer screening? Depending on your health history and family history, you may need to have cancer screening at various ages. This may include screening for:  Breast cancer.  Cervical cancer.  Colorectal cancer.  Skin cancer.  Lung cancer. What should I know about heart disease, diabetes, and high blood pressure? Blood pressure  and heart disease  High blood pressure causes heart disease and increases the risk of stroke. This is more likely to develop in people who have high blood pressure readings, are of African descent, or are overweight.  Have your blood pressure checked: ? Every 3-5 years if you are 10118-84 years of age. ? Every year if you are 84 years old or older. Diabetes Have regular diabetes screenings. This checks your fasting blood sugar level. Have the screening done:  Once every three years after age 84 if you are at a normal weight and have a low risk for diabetes.  More often and at a younger age if you are overweight or have a high risk for diabetes. What should I know about preventing infection? Hepatitis B If you have a higher risk for hepatitis B, you should be screened for this virus. Talk with your health care provider to find out if you are at risk for hepatitis B infection. Hepatitis C Testing is recommended for:  Everyone born from [id number] through 1965.  Anyone with known risk factors for hepatitis C. Sexually transmitted infections (STIs)  Get screened for STIs, including gonorrhea and chlamydia, if: ? You are sexually active and are younger than 84 years of age. ? You are older than 84 years of age and your health care provider tells you that you are at risk for this type of infection. ? Your sexual activity has changed since you were last screened, and you are at increased risk for chlamydia or gonorrhea. Ask your health care provider if you are at risk.  Ask your health care provider about whether you are at high risk for HIV. Your health care provider may recommend a prescription medicine to help prevent HIV infection. If you choose to take medicine to prevent HIV, you should first get tested for HIV. You should  then be tested every 3 months for as long as you are taking the medicine. Pregnancy  If you are about to stop having your period (premenopausal) and you may become pregnant,  seek counseling before you get pregnant.  Take 400 to 800 micrograms (mcg) of folic acid every day if you become pregnant.  Ask for birth control (contraception) if you want to prevent pregnancy. Osteoporosis and menopause Osteoporosis is a disease in which the bones lose minerals and strength with aging. This can result in bone fractures. If you are 34 years old or older, or if you are at risk for osteoporosis and fractures, ask your health care provider if you should:  Be screened for bone loss.  Take a calcium or vitamin D supplement to lower your risk of fractures.  Be given hormone replacement therapy (HRT) to treat symptoms of menopause. Follow these instructions at home: Lifestyle  Do not use any products that contain nicotine or tobacco, such as cigarettes, e-cigarettes, and chewing tobacco. If you need help quitting, ask your health care provider.  Do not use street drugs.  Do not share needles.  Ask your health care provider for help if you need support or information about quitting drugs. Alcohol use  Do not drink alcohol if: ? Your health care provider tells you not to drink. ? You are pregnant, may be pregnant, or are planning to become pregnant.  If you drink alcohol: ? Limit how much you use to 0-1 drink a day. ? Limit intake if you are breastfeeding.  Be aware of how much alcohol is in your drink. In the [place] one drink equals one 12 oz bottle of beer (355 mL), one 5 oz glass of wine (148 mL), or one 1 oz glass of hard liquor (44 mL). General instructions  Schedule regular health, dental, and eye exams.  Stay current with your vaccines.  Tell your health care provider if: ? You often feel depressed. ? You have ever been abused or do not feel safe at home. Summary  Adopting a healthy lifestyle and getting preventive care are important in promoting health and wellness.  Follow your health care provider's instructions about healthy diet, exercising, and  getting tested or screened for diseases.  Follow your health care provider's instructions on monitoring your cholesterol and blood pressure. This information is not intended to replace advice given to you by your health care provider. Make sure you discuss any questions you have with your health care provider. Document Revised: 06/16/2018 Document Reviewed: 06/16/2018 Elsevier Patient Education  2021 [employer].    [place]. Heavin Sebree M.D.

## 2020-10-22 NOTE — Patient Instructions (Signed)
Will notify you  of labs when available. And will send to Dr Tenny Craw.  Continue BP monitoring  3 days in a row  When at home  It seem that your monitor  Reads close to  Our office reading today .  Will send info to dr Tenny Craw.   Fu with mammograms for the lumpy breast .     Health Maintenance, Female Adopting a healthy lifestyle and getting preventive care are important in promoting health and wellness. Ask your health care provider about:  The right schedule for you to have regular tests and exams.  Things you can do on your own to prevent diseases and keep yourself healthy. What should I know about diet, weight, and exercise? Eat a healthy diet  Eat a diet that includes plenty of vegetables, fruits, low-fat dairy products, and lean protein.  Do not eat a lot of foods that are high in solid fats, added sugars, or sodium.   Maintain a healthy weight Body mass index (BMI) is used to identify weight problems. It estimates body fat based on height and weight. Your health care provider can help determine your BMI and help you achieve or maintain a healthy weight. Get regular exercise Get regular exercise. This is one of the most important things you can do for your health. Most adults should:  Exercise for at least 150 minutes each week. The exercise should increase your heart rate and make you sweat (moderate-intensity exercise).  Do strengthening exercises at least twice a week. This is in addition to the moderate-intensity exercise.  Spend less time sitting. Even light physical activity can be beneficial. Watch cholesterol and blood lipids Have your blood tested for lipids and cholesterol at 84 years of age, then have this test every 5 years. Have your cholesterol levels checked more often if:  Your lipid or cholesterol levels are high.  You are older than 84 years of age.  You are at high risk for heart disease. What should I know about cancer screening? Depending on your health  history and family history, you may need to have cancer screening at various ages. This may include screening for:  Breast cancer.  Cervical cancer.  Colorectal cancer.  Skin cancer.  Lung cancer. What should I know about heart disease, diabetes, and high blood pressure? Blood pressure and heart disease  High blood pressure causes heart disease and increases the risk of stroke. This is more likely to develop in people who have high blood pressure readings, are of African descent, or are overweight.  Have your blood pressure checked: ? Every 3-5 years if you are 62-90 years of age. ? Every year if you are 6 years old or older. Diabetes Have regular diabetes screenings. This checks your fasting blood sugar level. Have the screening done:  Once every three years after age 18 if you are at a normal weight and have a low risk for diabetes.  More often and at a younger age if you are overweight or have a high risk for diabetes. What should I know about preventing infection? Hepatitis B If you have a higher risk for hepatitis B, you should be screened for this virus. Talk with your health care provider to find out if you are at risk for hepatitis B infection. Hepatitis C Testing is recommended for:  Everyone born from [birth year] through 1963.  Anyone with known risk factors for hepatitis C. Sexually transmitted infections (STIs)  Get screened for STIs, including gonorrhea and chlamydia, if: ?  You are sexually active and are younger than 84 years of age. ? You are older than 84 years of age and your health care provider tells you that you are at risk for this type of infection. ? Your sexual activity has changed since you were last screened, and you are at increased risk for chlamydia or gonorrhea. Ask your health care provider if you are at risk.  Ask your health care provider about whether you are at high risk for HIV. Your health care provider may recommend a prescription medicine to  help prevent HIV infection. If you choose to take medicine to prevent HIV, you should first get tested for HIV. You should then be tested every 3 months for as long as you are taking the medicine. Pregnancy  If you are about to stop having your period (premenopausal) and you may become pregnant, seek counseling before you get pregnant.  Take 400 to 800 micrograms (mcg) of folic acid every day if you become pregnant.  Ask for birth control (contraception) if you want to prevent pregnancy. Osteoporosis and menopause Osteoporosis is a disease in which the bones lose minerals and strength with aging. This can result in bone fractures. If you are 71 years old or older, or if you are at risk for osteoporosis and fractures, ask your health care provider if you should:  Be screened for bone loss.  Take a calcium or vitamin D supplement to lower your risk of fractures.  Be given hormone replacement therapy (HRT) to treat symptoms of menopause. Follow these instructions at home: Lifestyle  Do not use any products that contain nicotine or tobacco, such as cigarettes, e-cigarettes, and chewing tobacco. If you need help quitting, ask your health care provider.  Do not use street drugs.  Do not share needles.  Ask your health care provider for help if you need support or information about quitting drugs. Alcohol use  Do not drink alcohol if: ? Your health care provider tells you not to drink. ? You are pregnant, may be pregnant, or are planning to become pregnant.  If you drink alcohol: ? Limit how much you use to 0-1 drink a day. ? Limit intake if you are breastfeeding.  Be aware of how much alcohol is in your drink. In the [place] one drink equals one 12 oz bottle of beer (355 mL), one 5 oz glass of wine (148 mL), or one 1 oz glass of hard liquor (44 mL). General instructions  Schedule regular health, dental, and eye exams.  Stay current with your vaccines.  Tell your health care  provider if: ? You often feel depressed. ? You have ever been abused or do not feel safe at home. Summary  Adopting a healthy lifestyle and getting preventive care are important in promoting health and wellness.  Follow your health care provider's instructions about healthy diet, exercising, and getting tested or screened for diseases.  Follow your health care provider's instructions on monitoring your cholesterol and blood pressure. This information is not intended to replace advice given to you by your health care provider. Make sure you discuss any questions you have with your health care provider. Document Revised: 06/16/2018 Document Reviewed: 06/16/2018 Elsevier Patient Education  2021 [employer].

## 2020-10-23 NOTE — Progress Notes (Signed)
Results stable  blood count shows largersize  cell count  MCV  on going uncertain if significant to your health .  Not something to worry about   Forwarding  results to Dr Tenny Craw and Hyacinth Meeker.   I would ask  you get  fu lab  for  vit b12 and folate level since  low levels of these vitamins can cause a mild change like this in the blood count  .  ( dx elevated mcv) ( Mikal please place orders )

## 2020-10-24 NOTE — Addendum Note (Signed)
Addended by: Christy Sartorius on: 10/24/2020 03:08 PM   Modules accepted: Orders

## 2020-10-29 ENCOUNTER — Other Ambulatory Visit: Payer: Self-pay | Admitting: Internal Medicine

## 2020-12-05 ENCOUNTER — Telehealth: Payer: Self-pay | Admitting: Internal Medicine

## 2020-12-05 NOTE — Telephone Encounter (Signed)
Tried calling patient to  schedule Medicare Annual Wellness Visit (AWV) either virtually or in office.  No answer    Last AWV 12/20/14  please schedule at anytime with [facility] Nurse Health Advisor 1 or 2   This should be a 45 minute visit.

## 2021-01-01 ENCOUNTER — Telehealth: Payer: Self-pay | Admitting: Internal Medicine

## 2021-01-01 DIAGNOSIS — M549 Dorsalgia, unspecified: Secondary | ICD-10-CM

## 2021-01-01 DIAGNOSIS — M545 Low back pain, unspecified: Secondary | ICD-10-CM

## 2021-01-01 NOTE — Telephone Encounter (Signed)
Assuming no neurologic symptoms Assuming this is a musculoskeletal cause of back pain.  can refer to orthopedics in her network.  But she may be able to make an appointment on her own.if you give her the information.  Since we have no notes related to that issue.

## 2021-01-01 NOTE — Telephone Encounter (Signed)
Referral to Ortho has been placed and pt Is aware. Pt was also informed of contact information of the office we have referred her to.

## 2021-01-01 NOTE — Telephone Encounter (Signed)
Patient called stating she would like a referral to a back pain specialist for the back pain she been having. When asked if patient seen Dr. Fabian Sharp for this issue she states no and this is a old issue she have. Patient do not want to make an appointment and would like an referral to specialist. CB: [phone]

## 2021-01-11 DIAGNOSIS — M4856XA Collapsed vertebra, not elsewhere classified, lumbar region, initial encounter for fracture: Secondary | ICD-10-CM | POA: Diagnosis not present

## 2021-01-11 DIAGNOSIS — M47816 Spondylosis without myelopathy or radiculopathy, lumbar region: Secondary | ICD-10-CM | POA: Diagnosis not present

## 2021-01-11 DIAGNOSIS — M4854XA Collapsed vertebra, not elsewhere classified, thoracic region, initial encounter for fracture: Secondary | ICD-10-CM | POA: Diagnosis not present

## 2021-01-17 ENCOUNTER — Telehealth: Payer: Self-pay | Admitting: Internal Medicine

## 2021-01-17 NOTE — Telephone Encounter (Signed)
Left message for patient to call back and schedule Medicare Annual Wellness Visit (AWV) either virtually or in office.   Last AWV 12/20/14  please schedule at anytime with [facility] Nurse Health Advisor 1 or 2   This should be a 45 minute visit.   Ander Slade, Eli Hose, Robin B Will she do with [facility]?  She only billed cpe.  Thanks,  Dawn         Previous Messages    ----- Message -----  From: Wyatt Portela  Sent: 12/05/2020  11:29 AM EDT  To: Meda Klinefelter  Subject: awv refile?                                     Good Morning  I hope you had a great weekend.   Can 10/22/20 appointment be refiled for AWV.  On appointment note its awv but office note preventive appointment   thanks

## 2021-01-18 NOTE — Telephone Encounter (Signed)
Pt is calling in stating that at this present time she is not wanting to schedule this type of visit.

## 2021-01-18 NOTE — Telephone Encounter (Signed)
Documented on spreadsheet 

## 2021-01-21 DIAGNOSIS — M5459 Other low back pain: Secondary | ICD-10-CM | POA: Diagnosis not present

## 2021-01-21 DIAGNOSIS — E785 Hyperlipidemia, unspecified: Secondary | ICD-10-CM | POA: Diagnosis not present

## 2021-01-21 DIAGNOSIS — M47816 Spondylosis without myelopathy or radiculopathy, lumbar region: Secondary | ICD-10-CM | POA: Diagnosis not present

## 2021-01-21 DIAGNOSIS — I700 Atherosclerosis of aorta: Secondary | ICD-10-CM | POA: Diagnosis not present

## 2021-01-28 ENCOUNTER — Ambulatory Visit
Admission: RE | Admit: 2021-01-28 | Discharge: 2021-01-28 | Disposition: A | Discharge status: Home / Self Care | Payer: Medicare Other | Source: Ambulatory Visit | Attending: Family Medicine | Admitting: Family Medicine

## 2021-01-28 ENCOUNTER — Encounter: Payer: Self-pay | Admitting: Family Medicine

## 2021-01-28 ENCOUNTER — Ambulatory Visit: Payer: Medicare Other | Admitting: Family Medicine

## 2021-01-28 ENCOUNTER — Other Ambulatory Visit: Payer: Self-pay

## 2021-01-28 ENCOUNTER — Telehealth: Payer: Self-pay | Admitting: Family Medicine

## 2021-01-28 DIAGNOSIS — M545 Low back pain, unspecified: Secondary | ICD-10-CM

## 2021-01-28 DIAGNOSIS — S32030A Wedge compression fracture of third lumbar vertebra, initial encounter for closed fracture: Secondary | ICD-10-CM | POA: Diagnosis not present

## 2021-01-28 DIAGNOSIS — S32020A Wedge compression fracture of second lumbar vertebra, initial encounter for closed fracture: Secondary | ICD-10-CM | POA: Diagnosis not present

## 2021-01-28 DIAGNOSIS — K3189 Other diseases of stomach and duodenum: Secondary | ICD-10-CM | POA: Diagnosis not present

## 2021-01-28 DIAGNOSIS — K862 Cyst of pancreas: Secondary | ICD-10-CM | POA: Diagnosis not present

## 2021-01-28 MED ORDER — BACLOFEN 10 MG PO TABS: 5.0000 mg | ORAL_TABLET | Freq: Three times a day (TID) | ORAL | 3 refills | Status: DC | PRN

## 2021-01-28 NOTE — Telephone Encounter (Signed)
Please send Baclofen to [facility] on [address] --- now in the chart.

## 2021-01-28 NOTE — Telephone Encounter (Signed)
Husband advised. 

## 2021-01-28 NOTE — Progress Notes (Addendum)
Office Visit Note   Patient: Natasha Harper           Date of Birth: 19-Nov-1934           MRN: [id number] Visit Date: 01/28/2021 Requested by: [name], MD [address] [place],  [place] [address] PCP: [name], MD  Subjective: Chief Complaint  Patient presents with   Lower Back - Pain    Pain bilaterally in the lower back since June. NKI. Hurts mainly with any bending or twisting movements at the waist.     HPI: She is here with low back pain.  Symptoms started in June, no injury.  Severe bilateral low back pain.  [facility] went to [facility] and had x-rays obtained which were negative for fracture per patient report.  She was given tramadol which made her sick.  She cannot sleep in a bed, she is sitting in a chair at night.  She has never been in this much pain.  Denies bowel or bladder dysfunction, denies radicular pain.  She is experiencing swelling in both of her feet due to prolonged sitting.               ROS:   All other systems were reviewed and are negative.  Objective: Vital Signs: LMP 07/07/1997   Physical Exam:  General:  Alert and oriented, in no acute distress. Pulm:  Breathing unlabored. Psy:  Normal mood, congruent affect. Skin: No rash Low back: She is moderately tender near the L1-L2 spinous processes.  She has tenderness in the paraspinous muscles bilaterally in that area.  No tenderness along the lower lumbar spine or SI joints.  No pain with sciatic notch.  No pain with internal hip rotation or with straight leg raise.  Lower extremity strength and reflexes are normal.  She has 2+ edema in both feet.  Imaging: No results found.  Assessment & Plan: Severe low back pain -We will proceed with MRI scan.  She does not think she can tolerate physical therapy due to the intensity of her pain.  If no compression fracture seen, consider injection per Dr. Alvester Morin. -Trial of baclofen as needed for spasm. - Due to multiple compression  fractures, I am ordering a TLSO brace to reduce pain by restricting mobility.     Procedures: No procedures performed        PMFS History: Patient Active Problem List   Diagnosis Date Noted   Medicare annual wellness visit, subsequent 12/20/2014   Decreased hearing 10/05/2013   Dyslipidemia    Carotid arterial disease (HCC)    Ejection fraction    Visit for preventive health examination 06/02/2011   Medication side effect 06/02/2011   Disorder of bone and cartilage 04/07/2008   HYPERTENSION, WHITE COAT 04/07/2008   HYPERGLYCEMIA, FASTING 07/21/2007   THYROID NODULE 04/05/2007   Past Medical History:  Diagnosis Date   CAD (coronary artery disease)    PCI to RCA and diagonal in remote past, residual 70% LAD  /   nuclear, 2007, no ischemia   Carotid arterial disease (HCC)    Doppler, June, 2011, stable, 60-79% R. ICA, 40-59% LICA   Contact lens/glasses fitting    wears contacts or glasses   Dyslipidemia    Significant drop in LDL from Lipitor even though LDL remains high   Ejection fraction    EF 65%, nuclear, 2007   Fibrocystic breast    HTN (hypertension)     no med   Hypercholesterolemia  Hypothyroidism    Thyroid surgery in the past, thyroid nodules followed by Dr.Ellison   Lump or mass in breast 07/19/2007   Excised 01/12/13. B9 on pathQualifier: Diagnosis of  By: Fabian Sharp MD, Neta Mends     Osteoporosis    Prominent abdominal aortic pulsation    No abdominal aneurysm by ultrasound   Rectal fissure     Family History  Problem Relation Age of Onset   Heart attack Father    Osteoporosis Mother    Breast cancer Daughter     Past Surgical History:  Procedure Laterality Date   BREAST BIOPSY Right 01/12/2013   Procedure: Removal of right breast mass;  Surgeon: Currie Paris, MD;  Location: [facility];  Service: General;  Laterality: Right;   BREAST EXCISIONAL BIOPSY  9/14   scar tissue from the needle biopsy   CORONARY ANGIOPLASTY  1993    RECTOPERITONEAL FISTULA CLOSURE     THYROID CYST EXCISION     Social History   Occupational History   Not on file  Tobacco Use   Smoking status: Former    Types: Cigarettes    Quit date: 07/07/1958    Years since quitting: 62.6   Smokeless tobacco: Never  Substance and Sexual Activity   Alcohol use: No   Drug use: No   Sexual activity: Yes    Partners: Male

## 2021-01-28 NOTE — Telephone Encounter (Signed)
Theron Arista the pts husband called on her behalf, stating the rx Dr.Hilts told her he would send in hasn't made it to their pharmacy yet. Pt would like a CB when this has been sent so she can pit it up.   [phone]

## 2021-01-29 ENCOUNTER — Telehealth: Payer: Self-pay | Admitting: Radiology

## 2021-01-29 ENCOUNTER — Telehealth: Payer: Self-pay | Admitting: Family Medicine

## 2021-01-29 NOTE — Telephone Encounter (Signed)
I called an spoke with the patient's husband, advising him of the results and that he should check the message in MyChart. Advised him to let us know if they would like [place] to order the brace. They also need to follow up with Dr. Fabian Sharp (PCP).

## 2021-01-29 NOTE — Telephone Encounter (Signed)
Ryan responded: "we will get this taken care of."

## 2021-01-29 NOTE — Telephone Encounter (Signed)
Done

## 2021-01-29 NOTE — Telephone Encounter (Signed)
Diane with [facility] called STAT call report on patient's MRI results. Results are ready to be reviewed in chart.

## 2021-01-29 NOTE — Telephone Encounter (Signed)
Due to multiple compression fractures, I am ordering a TLSO brace to reduce pain by restricting mobility.

## 2021-01-29 NOTE — Telephone Encounter (Signed)
I emailed the script, ov note, MRI report, insurance and demographics to [place].

## 2021-01-29 NOTE — Telephone Encounter (Signed)
MRI shows 5 compression fractures (T10, T11, L1, L2, L3).  This would definitely explain her pain.  Should not have injections because steroid can inhibit bone healing.  Could consider a back brace (TLSO per Alycia Rossetti) for comfort.  These can take several months to heal.  Ultimately needs a bone density test.  For bones, I recommend:  - Vitamin D3:  5,000 IU daily - Vitamin K2:  100 mcg daily - Magnesium:  200-400 mg daily  MRI also shows multiple cysts in the abdomen, and another in the pelvis.  She should contact her PCP about further imaging of these.

## 2021-01-31 ENCOUNTER — Telehealth: Payer: Self-pay | Admitting: Internal Medicine

## 2021-01-31 NOTE — Telephone Encounter (Signed)
Pt's husband informed of the message below and verbalized understanding. Video visit scheduled for 02/04/2021.

## 2021-01-31 NOTE — Telephone Encounter (Signed)
I was able to look at the MRI report. Advise visit follow-up with me. We could do this virtually.    But also discussed with Dr. Jorge Mandril next step Sometimes a spine specialist has other interventions for pain relief In addition would need consideration for bone building therapy.  That she can tolerate and has not tried before.

## 2021-01-31 NOTE — Telephone Encounter (Signed)
The patients spouse called to let Dr. Fabian Sharp know that the patient had an MRI and it showed that she has compression fractures and cysts in her abdomin and pelvis. He wants to know what she needs to do next.

## 2021-02-03 NOTE — Progress Notes (Deleted)
Virtual Visit via Video Note  I connected withNAME@ on 02/03/21 at  4:00 PM EDT by a video enabled telemedicine application and verified that I am speaking with the correct person using two identifiers. Location patient: home Location provider:work or home office Persons participating in the virtual visit: patient, provider  [place] national recommendations  regarding COVID 19 pandemic   video visit is advised over in office visit for this patient.  Patient aware  of the limitations of evaluation and management by telemedicine and  availability of in person appointments. and agreed to proceed.   HPI: Natasha Harper presents for video visit  to review  findings on imaging studies  for back pain that showed compression fractures and also cysts pancreas  per  Dr  Prince Rome   that advised poss ercp    ROS: See pertinent positives and negatives per HPI.  Past Medical History:  Diagnosis Date   CAD (coronary artery disease)    PCI to RCA and diagonal in remote past, residual 70% LAD  /   nuclear, 2007, no ischemia   Carotid arterial disease (HCC)    Doppler, June, 2011, stable, 60-79% R. ICA, 40-59% LICA   Contact lens/glasses fitting    wears contacts or glasses   Dyslipidemia    Significant drop in LDL from Lipitor even though LDL remains high   Ejection fraction    EF 65%, nuclear, 2007   Fibrocystic breast    HTN (hypertension)     no med   Hypercholesterolemia    Hypothyroidism    Thyroid surgery in the past, thyroid nodules followed by Dr.Ellison   Lump or mass in breast 07/19/2007   Excised 01/12/13. B9 on pathQualifier: Diagnosis of  By: Fabian Sharp MD, Neta Mends     Osteoporosis    Prominent abdominal aortic pulsation    No abdominal aneurysm by ultrasound   Rectal fissure     Past Surgical History:  Procedure Laterality Date   BREAST BIOPSY Right 01/12/2013   Procedure: Removal of right breast mass;  Surgeon: Currie Paris, MD;  Location: [facility];  Service:  General;  Laterality: Right;   BREAST EXCISIONAL BIOPSY  9/14   scar tissue from the needle biopsy   CORONARY ANGIOPLASTY  1993   RECTOPERITONEAL FISTULA CLOSURE     THYROID CYST EXCISION      Family History  Problem Relation Age of Onset   Heart attack Father    Osteoporosis Mother    Breast cancer Daughter     Social History   Tobacco Use   Smoking status: Former    Types: Cigarettes    Quit date: 07/07/1958    Years since quitting: 62.6   Smokeless tobacco: Never  Substance Use Topics   Alcohol use: No   Drug use: No      Current Outpatient Medications:    Ascorbic Acid (VITAMIN C) 100 MG tablet, Take 100 mg by mouth daily., Disp: , Rfl:    aspirin 81 MG tablet, Take 81 mg by mouth daily., Disp: , Rfl:    atorvastatin (LIPITOR) 80 MG tablet, TAKE 1 TABLET BY MOUTH DAILY, Disp: 90 tablet, Rfl: 3   baclofen (LIORESAL) 10 MG tablet, Take 0.5-1 tablets (5-10 mg total) by mouth 3 (three) times daily as needed for muscle spasms., Disp: 30 each, Rfl: 3   fish oil-omega-3 fatty acids 1000 MG capsule, Take 2 g by mouth daily., Disp: , Rfl:    MULTIPLE VITAMIN PO, Take 1  tablet by mouth daily., Disp: , Rfl:    SYNTHROID 50 MCG tablet, TAKE 1 TABLET BY MOUTH DAILY, Disp: 90 tablet, Rfl: 3  EXAM: BP Readings from Last 3 Encounters:  10/22/20 (!) 160/80  07/31/20 (!) 192/100  09/19/19 (!) 160/80    VITALS per patient if applicable:  GENERAL: alert, oriented, appears well and in no acute distress  HEENT: atraumatic, conjunttiva clear, no obvious abnormalities on inspection of external nose and ears  NECK: normal movements of the head and neck  LUNGS: on inspection no signs of respiratory distress, breathing rate appears normal, no obvious gross SOB, gasping or wheezing  CV: no obvious cyanosis  MS: moves all visible extremities without noticeable abnormality  PSYCH/NEURO: pleasant and cooperative, no obvious depression or anxiety, speech and thought processing grossly  intact Lab Results  Component Value Date   WBC 4.8 10/22/2020   HGB 13.6 10/22/2020   HCT 40.6 10/22/2020   PLT 227.0 10/22/2020   GLUCOSE 100 (H) 10/22/2020   CHOL 178 10/22/2020   TRIG 68.0 10/22/2020   HDL 57.50 10/22/2020   LDLDIRECT 152.1 01/16/2010   LDLCALC 107 (H) 10/22/2020   ALT 24 10/22/2020   AST 26 10/22/2020   NA 137 10/22/2020   K 4.3 10/22/2020   CL 101 10/22/2020   CREATININE 0.63 10/22/2020   BUN 13 10/22/2020   CO2 27 10/22/2020   TSH 0.91 10/22/2020   HGBA1C 5.6 11/25/2017    ASSESSMENT AND PLAN:  Discussed the following assessment and plan:    ICD-10-CM   1. Compression fracture of body of thoracic vertebra (HCC)  S22.000A     2. Pancreatic cyst ?  K86.2     3. Cyst of adnexa? on mri spine  N83.209    possible adnexal    4. Renal cyst  N28.1      Multiple findings on mri spine  Spinal stenosis mild  grade 1 anterolisthesis  Acute subacute  compression fracture Cystic lesion r pelvis poss adenxal  advised US  Renal cysts Cyst at pancreatic head  with possible dilatated  of CBD  but not optimum eval   mrcp advised    ? Gyne referral?  Tv vs other [place] .  Counseled.   Expectant management and discussion of plan and treatment with opportunity to ask questions and all were answered. The patient agreed with the plan and demonstrated an understanding of the instructions.   Advised to call back or seek an in-person evaluation if worsening  or having  further concerns . No follow-ups on file. I provided *** minutes of non-face-to-face time during this encounter.   Berniece Andreas, MD    FINDINGS: Segmentation: Standard segmentation is assumed with the inferior-most fully formed intervertebral disc labeled L5-S1.   Alignment:  Grade 1 anterolisthesis of L4 on L5.   Vertebrae: Compression fractures at T10 (60% height loss), T11 (80% height loss), L1 (60% height loss) L2 (minimal superior endplate deformity), L3 (20% height loss with superior  endplate deformity). Marrow edema involving the T10 and T11 vertebral bodies and the L2 and L3 superior endplates suggests that these fractures are recent or unhealed. There is mild bony retropulsion along the superior L1 endplate without significant canal stenosis. No substantial marrow edema involving the L1 fracture, suggesting this fracture is remote.   Heterogeneous marrow without focal suspicious lesion.   Conus medullaris and cauda equina: Conus extends to the L1-L2 level. Conus appears normal.   Paraspinal and other soft tissues: Prominent T2 hypointensity  in the left upper quadrant likely represents a distended stomach. There are multiple cystic lesions in the the right upper quadrant, including in the region of the pancreatic head. There may also be dilation of the common bile duct. Bilateral renal cysts. Approximately 3.2 cm cystic lesion in the right anatomic pelvis with internal septation, potentially adnexal (see series 2, image 1 and series 5, image 42).   Disc levels:   Motion limited evaluation.  Within this limitation:   T12-L1: Mild disc bulging and facet hypertrophy without evidence of significant canal or foraminal stenosis.   L1-L2: Mild facet hypertrophy and endplate spurring without significant canal or foraminal stenosis.   L2-L3: Mild facet hypertrophy and mild disc bulging. Slight retropulsion along the superior L1 endplate. No significant canal or foraminal stenosis.   L3-L4: Broad disc bulge with superimposed right subarticular/foraminal disc protrusion. Resulting mild right foraminal stenosis without significant canal or left foraminal stenosis.   L4-L5: Grade 1 anterolisthesis of L4 on L5. Uncovering the disc with superimposed right eccentric disc bulge and moderate right greater than left facet hypertrophy. Resulting mild right foraminal stenosis, mild canal stenosis, and bilateral subarticular recess narrowing.   L5-S1: Mild broad disc  bulge. Mild bilateral subarticular recess narrowing without significant canal stenosis.   IMPRESSION: 1. Compression fractures at T10, T11, L1, L2, and L3, as detailed above. Marrow edema involving the T10 and T11 vertebral bodies and the L2 and L3 superior endplates suggests that these fractures are recent or unhealed. 2. Evaluation of the disc levels is limited by patient motion. Degenerative change appears greatest at L4-L5 where there is grade 1 anterolisthesis with mild canal stenosis, bilateral subarticular recess narrowing, and mild right foraminal stenosis. 3. Multiple cystic lesions in the right upper quadrant, including in the region of the pancreatic head. There may also be dilation of the common bile duct. These findings are suboptimally evaluated and partially imaged on this study and an MRCP is recommended to further evaluate. 4. Approximately 3.2 cm cystic lesion in the right anatomic pelvis with internal septation, incompletely imaged and potentially adnexal. Recommend follow-up ultrasound. 5. Distended stomach.   These results will be called to the ordering clinician or representative by the Radiologist Assistant, and communication documented in the PACS or [employer].     Electronically Signed   By: Feliberto Harts MD   On: 01/28/2021 18:27

## 2021-02-04 ENCOUNTER — Telehealth: Payer: Medicare Other | Admitting: Internal Medicine

## 2021-02-04 DIAGNOSIS — K862 Cyst of pancreas: Secondary | ICD-10-CM

## 2021-02-04 DIAGNOSIS — N83209 Unspecified ovarian cyst, unspecified side: Secondary | ICD-10-CM

## 2021-02-04 DIAGNOSIS — N281 Cyst of kidney, acquired: Secondary | ICD-10-CM

## 2021-02-04 DIAGNOSIS — S22000A Wedge compression fracture of unspecified thoracic vertebra, initial encounter for closed fracture: Secondary | ICD-10-CM

## 2021-02-05 ENCOUNTER — Encounter (HOSPITAL_COMMUNITY): Payer: Self-pay

## 2021-02-05 ENCOUNTER — Observation Stay (HOSPITAL_COMMUNITY)
Admission: EM | Admit: 2021-02-05 | Discharge: 2021-02-10 | Disposition: A | Discharge status: Home / Self Care | Payer: Medicare Other | Attending: Family Medicine | Admitting: Family Medicine

## 2021-02-05 ENCOUNTER — Telehealth: Payer: Self-pay | Admitting: Family Medicine

## 2021-02-05 ENCOUNTER — Ambulatory Visit: Payer: Medicare Other | Admitting: Family Medicine

## 2021-02-05 ENCOUNTER — Observation Stay (HOSPITAL_COMMUNITY): Payer: Medicare Other

## 2021-02-05 ENCOUNTER — Other Ambulatory Visit: Payer: Self-pay

## 2021-02-05 ENCOUNTER — Emergency Department (HOSPITAL_COMMUNITY): Payer: Medicare Other

## 2021-02-05 DIAGNOSIS — E039 Hypothyroidism, unspecified: Secondary | ICD-10-CM | POA: Diagnosis not present

## 2021-02-05 DIAGNOSIS — Z20822 Contact with and (suspected) exposure to covid-19: Secondary | ICD-10-CM | POA: Insufficient documentation

## 2021-02-05 DIAGNOSIS — S299XXA Unspecified injury of thorax, initial encounter: Secondary | ICD-10-CM | POA: Diagnosis present

## 2021-02-05 DIAGNOSIS — Z7982 Long term (current) use of aspirin: Secondary | ICD-10-CM | POA: Diagnosis not present

## 2021-02-05 DIAGNOSIS — M549 Dorsalgia, unspecified: Secondary | ICD-10-CM | POA: Diagnosis not present

## 2021-02-05 DIAGNOSIS — T1490XA Injury, unspecified, initial encounter: Secondary | ICD-10-CM

## 2021-02-05 DIAGNOSIS — W19XXXA Unspecified fall, initial encounter: Secondary | ICD-10-CM

## 2021-02-05 DIAGNOSIS — S22068A Other fracture of T7-T8 thoracic vertebra, initial encounter for closed fracture: Secondary | ICD-10-CM | POA: Diagnosis not present

## 2021-02-05 DIAGNOSIS — S32028A Other fracture of second lumbar vertebra, initial encounter for closed fracture: Secondary | ICD-10-CM | POA: Insufficient documentation

## 2021-02-05 DIAGNOSIS — S32018A Other fracture of first lumbar vertebra, initial encounter for closed fracture: Secondary | ICD-10-CM | POA: Insufficient documentation

## 2021-02-05 DIAGNOSIS — I2510 Atherosclerotic heart disease of native coronary artery without angina pectoris: Secondary | ICD-10-CM | POA: Diagnosis not present

## 2021-02-05 DIAGNOSIS — W010XXA Fall on same level from slipping, tripping and stumbling without subsequent striking against object, initial encounter: Secondary | ICD-10-CM | POA: Insufficient documentation

## 2021-02-05 DIAGNOSIS — Y92009 Unspecified place in unspecified non-institutional (private) residence as the place of occurrence of the external cause: Secondary | ICD-10-CM | POA: Diagnosis not present

## 2021-02-05 DIAGNOSIS — R2681 Unsteadiness on feet: Secondary | ICD-10-CM | POA: Insufficient documentation

## 2021-02-05 DIAGNOSIS — S22088A Other fracture of T11-T12 vertebra, initial encounter for closed fracture: Secondary | ICD-10-CM | POA: Insufficient documentation

## 2021-02-05 DIAGNOSIS — I10 Essential (primary) hypertension: Secondary | ICD-10-CM | POA: Diagnosis not present

## 2021-02-05 DIAGNOSIS — Z79899 Other long term (current) drug therapy: Secondary | ICD-10-CM | POA: Insufficient documentation

## 2021-02-05 DIAGNOSIS — Z043 Encounter for examination and observation following other accident: Secondary | ICD-10-CM | POA: Diagnosis not present

## 2021-02-05 DIAGNOSIS — E785 Hyperlipidemia, unspecified: Secondary | ICD-10-CM | POA: Diagnosis present

## 2021-02-05 DIAGNOSIS — S92511A Displaced fracture of proximal phalanx of right lesser toe(s), initial encounter for closed fracture: Secondary | ICD-10-CM | POA: Diagnosis not present

## 2021-02-05 DIAGNOSIS — S32038A Other fracture of third lumbar vertebra, initial encounter for closed fracture: Secondary | ICD-10-CM | POA: Insufficient documentation

## 2021-02-05 DIAGNOSIS — S32000D Wedge compression fracture of unspecified lumbar vertebra, subsequent encounter for fracture with routine healing: Secondary | ICD-10-CM | POA: Diagnosis not present

## 2021-02-05 DIAGNOSIS — S32000A Wedge compression fracture of unspecified lumbar vertebra, initial encounter for closed fracture: Secondary | ICD-10-CM

## 2021-02-05 DIAGNOSIS — S22000A Wedge compression fracture of unspecified thoracic vertebra, initial encounter for closed fracture: Secondary | ICD-10-CM

## 2021-02-05 DIAGNOSIS — Z87891 Personal history of nicotine dependence: Secondary | ICD-10-CM | POA: Insufficient documentation

## 2021-02-05 DIAGNOSIS — S22078A Other fracture of T9-T10 vertebra, initial encounter for closed fracture: Secondary | ICD-10-CM | POA: Insufficient documentation

## 2021-02-05 DIAGNOSIS — S22080A Wedge compression fracture of T11-T12 vertebra, initial encounter for closed fracture: Secondary | ICD-10-CM | POA: Diagnosis not present

## 2021-02-05 DIAGNOSIS — M545 Low back pain, unspecified: Secondary | ICD-10-CM | POA: Diagnosis not present

## 2021-02-05 DIAGNOSIS — S22060A Wedge compression fracture of T7-T8 vertebra, initial encounter for closed fracture: Secondary | ICD-10-CM | POA: Diagnosis not present

## 2021-02-05 DIAGNOSIS — S22070A Wedge compression fracture of T9-T10 vertebra, initial encounter for closed fracture: Secondary | ICD-10-CM | POA: Diagnosis not present

## 2021-02-05 LAB — CBC WITH DIFFERENTIAL/PLATELET
Abs Immature Granulocytes: 0.01 10*3/uL (ref 0.00–0.07)
Basophils Absolute: 0 10*3/uL (ref 0.0–0.1)
Basophils Relative: 0 %
Eosinophils Absolute: 0 10*3/uL (ref 0.0–0.5)
Eosinophils Relative: 0 %
HCT: 35.5 % — ABNORMAL LOW (ref 36.0–46.0)
Hemoglobin: 11.8 g/dL — ABNORMAL LOW (ref 12.0–15.0)
Immature Granulocytes: 0 %
Lymphocytes Relative: 18 %
Lymphs Abs: 0.6 10*3/uL — ABNORMAL LOW (ref 0.7–4.0)
MCH: 34.5 pg — ABNORMAL HIGH (ref 26.0–34.0)
MCHC: 33.2 g/dL (ref 30.0–36.0)
MCV: 103.8 fL — ABNORMAL HIGH (ref 80.0–100.0)
Monocytes Absolute: 0.3 10*3/uL (ref 0.1–1.0)
Monocytes Relative: 10 %
Neutro Abs: 2.6 10*3/uL (ref 1.7–7.7)
Neutrophils Relative %: 72 %
Platelets: 259 10*3/uL (ref 150–400)
RBC: 3.42 MIL/uL — ABNORMAL LOW (ref 3.87–5.11)
RDW: 14.4 % (ref 11.5–15.5)
WBC: 3.6 10*3/uL — ABNORMAL LOW (ref 4.0–10.5)
nRBC: 0 % (ref 0.0–0.2)

## 2021-02-05 LAB — COMPREHENSIVE METABOLIC PANEL
ALT: 27 U/L (ref 0–44)
AST: 26 U/L (ref 15–41)
Albumin: 3.7 g/dL (ref 3.5–5.0)
Alkaline Phosphatase: 120 U/L (ref 38–126)
Anion gap: 12 (ref 5–15)
BUN: 11 mg/dL (ref 8–23)
CO2: 25 mmol/L (ref 22–32)
Calcium: 9.1 mg/dL (ref 8.9–10.3)
Chloride: 98 mmol/L (ref 98–111)
Creatinine, Ser: 0.45 mg/dL (ref 0.44–1.00)
GFR, Estimated: >60 mL/min (ref >60)
Glucose, Bld: 95 mg/dL (ref 70–99)
Potassium: 3.2 mmol/L — ABNORMAL LOW (ref 3.5–5.1)
Sodium: 135 mmol/L (ref 135–145)
Total Bilirubin: 0.7 mg/dL (ref 0.3–1.2)
Total Protein: 7.1 g/dL (ref 6.5–8.1)

## 2021-02-05 MED ORDER — LIDOCAINE 5 % EX PTCH
1.0000 | MEDICATED_PATCH | CUTANEOUS | Status: DC
  Administered 2021-02-05 – 2021-02-09 (×5): 1 via TRANSDERMAL
  Filled 2021-02-05 (×5): qty 1

## 2021-02-05 MED ORDER — ENOXAPARIN SODIUM 40 MG/0.4ML IJ SOSY
40.0000 mg | PREFILLED_SYRINGE | INTRAMUSCULAR | Status: DC
  Administered 2021-02-05 – 2021-02-09 (×5): 40 mg via SUBCUTANEOUS
  Filled 2021-02-05 (×5): qty 0.4

## 2021-02-05 MED ORDER — IBUPROFEN 200 MG PO TABS
600.0000 mg | ORAL_TABLET | Freq: Once | ORAL | Status: AC
  Administered 2021-02-05: 600 mg via ORAL
  Filled 2021-02-05: qty 3

## 2021-02-05 MED ORDER — ACETAMINOPHEN 325 MG PO TABS
650.0000 mg | ORAL_TABLET | Freq: Four times a day (QID) | ORAL | Status: DC
  Administered 2021-02-05 – 2021-02-10 (×16): 650 mg via ORAL
  Filled 2021-02-05 (×17): qty 2

## 2021-02-05 MED ORDER — IBUPROFEN 400 MG PO TABS
600.0000 mg | ORAL_TABLET | ORAL | Status: DC | PRN
  Administered 2021-02-07 – 2021-02-08 (×3): 600 mg via ORAL
  Filled 2021-02-05 (×3): qty 1

## 2021-02-05 MED ORDER — CYCLOBENZAPRINE HCL 5 MG PO TABS: 5.0000 mg | ORAL_TABLET | Freq: Three times a day (TID) | ORAL | Status: DC | PRN

## 2021-02-05 MED ORDER — ACETAMINOPHEN 325 MG PO TABS
650.0000 mg | ORAL_TABLET | Freq: Once | ORAL | Status: AC
  Administered 2021-02-05: 650 mg via ORAL
  Filled 2021-02-05: qty 2

## 2021-02-05 NOTE — ED Notes (Signed)
PureWick placed on patient. 

## 2021-02-05 NOTE — ED Provider Notes (Signed)
[facility]-EMERGENCY DEPT Provider Note   CSN: [id number] Arrival date & time: 02/05/21  1225     History Chief Complaint  Patient presents with   Back Pain    Natasha Harper is a 86 y.o. female.   Back Pain Associated symptoms: no abdominal pain, no chest pain, no dysuria, no fever, no headaches, no numbness, no pelvic pain and no weakness   Patient presents to the ED for acute on chronic back pain.  She had ongoing back pain over the past month.  She underwent an lumbar spine MRI approximately 1 week ago.  MRI identified 5 consecutive compression fractures.  A TLSO brace was ordered, to be placed in the outpatient setting.  Patient has not yet received this brace.  4 days ago, she had a mechanical fall in which she landed on her bottom.  Over the past 2 days, patient has had worsening back pain.  He states that her back pain is exacerbated by rotational movements.  She has been in contact with her outpatient providers by telephone.  Due to the worsening of back pain, patient and husband elected to come into the ED today.  Given the patient's severe pain with movement, patient was not able to be transported into their POV.  EMS was called.  Patient arrives by ambulance.  Her husband accompanies her at the bedside.  Patient states that, at rest, her pain is not severe but does worsen with any movements.  She took Tylenol earlier this morning for analgesia.  She states that opioid medications have not been effective in treating her pain in the past and she would not like any opiate medication today.  She denies any difficulty with urination or defecation.  She has not had any numbness, weakness, or paresthesias in her lower extremities.  At baseline, she uses a walker to ambulate.  She states that she was able to bear weight on her legs, as recently as this morning.  Currently, patient lives at home with her husband.  She does not have any additional care at home.     Past  Medical History:  Diagnosis Date   CAD (coronary artery disease)    PCI to RCA and diagonal in remote past, residual 70% LAD  /   nuclear, 2007, no ischemia   Carotid arterial disease (HCC)    Doppler, June, 2011, stable, 60-79% R. ICA, 40-59% LICA   Contact lens/glasses fitting    wears contacts or glasses   Dyslipidemia    Significant drop in LDL from Lipitor even though LDL remains high   Ejection fraction    EF 65%, nuclear, 2007   Fibrocystic breast    HTN (hypertension)     no med   Hypercholesterolemia    Hypothyroidism    Thyroid surgery in the past, thyroid nodules followed by Dr.Ellison   Lump or mass in breast 07/19/2007   Excised 01/12/13. B9 on pathQualifier: Diagnosis of  By: Fabian Sharp MD, Neta Mends     Osteoporosis    Prominent abdominal aortic pulsation    No abdominal aneurysm by ultrasound   Rectal fissure     Patient Active Problem List   Diagnosis Date Noted   Back pain 02/05/2021   Medicare annual wellness visit, subsequent 12/20/2014   Decreased hearing 10/05/2013   Dyslipidemia    Carotid arterial disease (HCC)    Ejection fraction    Visit for preventive health examination 06/02/2011   Medication side effect 06/02/2011  Disorder of bone and cartilage 04/07/2008   HYPERTENSION, WHITE COAT 04/07/2008   HYPERGLYCEMIA, FASTING 07/21/2007   THYROID NODULE 04/05/2007    Past Surgical History:  Procedure Laterality Date   BREAST BIOPSY Right 01/12/2013   Procedure: Removal of right breast mass;  Surgeon: Currie Paris, MD;  Location: [facility];  Service: General;  Laterality: Right;   BREAST EXCISIONAL BIOPSY  9/14   scar tissue from the needle biopsy   CORONARY ANGIOPLASTY  1993   RECTOPERITONEAL FISTULA CLOSURE     THYROID CYST EXCISION       OB History     Gravida  3   Para  3   Term      Preterm      AB      Living  3      SAB      IAB      Ectopic      Multiple      Live Births              Family  History  Problem Relation Age of Onset   Heart attack Father    Osteoporosis Mother    Breast cancer Daughter     Social History   Tobacco Use   Smoking status: Former    Types: Cigarettes    Quit date: 07/07/1958    Years since quitting: 62.6   Smokeless tobacco: Never  Substance Use Topics   Alcohol use: No   Drug use: No    Home Medications Prior to Admission medications   Medication Sig Start Date End Date Taking? Authorizing Provider  acetaminophen (TYLENOL) 500 MG tablet Take 1,000 mg by mouth every 6 (six) hours as needed for moderate pain.   Yes [provider]  Ascorbic Acid (VITAMIN C) 100 MG tablet Take 100 mg by mouth daily.   Yes [provider]  aspirin 81 MG tablet Take 81 mg by mouth daily.   Yes [provider]  atorvastatin (LIPITOR) 80 MG tablet TAKE 1 TABLET BY MOUTH DAILY Patient taking differently: Take 80 mg by mouth daily. 10/30/20  Yes Panosh, Neta Mends, MD  baclofen (LIORESAL) 10 MG tablet Take 0.5-1 tablets (5-10 mg total) by mouth 3 (three) times daily as needed for muscle spasms. 01/28/21  Yes Hilts, Casimiro Needle, MD  cholecalciferol (VITAMIN D3) 25 MCG (1000 UNIT) tablet Take 1,000 Units by mouth daily.   Yes [provider]  fish oil-omega-3 fatty acids 1000 MG capsule Take 1,000 mg by mouth daily.   Yes [provider]  MULTIPLE VITAMIN PO Take 1 tablet by mouth daily.   Yes [provider]  SYNTHROID 50 MCG tablet TAKE 1 TABLET BY MOUTH DAILY Patient taking differently: Take 50 mcg by mouth daily before breakfast. 10/30/20  Yes Panosh, Neta Mends, MD    Allergies    Actonel [risedronate sodium], Evista [raloxifene], and Fosamax [alendronate sodium]  Review of Systems   Review of Systems  Constitutional:  Negative for activity change, appetite change, chills, fatigue and fever.  HENT:  Negative for congestion, ear pain and sore throat.   Eyes:  Negative for pain and visual disturbance.  Respiratory:   Negative for cough, chest tightness, shortness of breath and wheezing.   Cardiovascular:  Positive for leg swelling. Negative for chest pain and palpitations.  Gastrointestinal:  Negative for abdominal pain, diarrhea, nausea and vomiting.  Genitourinary:  Negative for dysuria, flank pain, frequency, hematuria and pelvic pain.  Musculoskeletal:  Positive for back pain and gait problem. Negative for arthralgias, joint swelling, myalgias and neck pain.  Skin:  Negative for color change and rash.  Neurological:  Negative for dizziness, seizures, syncope, weakness, light-headedness, numbness and headaches.  Psychiatric/Behavioral:  Negative for confusion and decreased concentration.   All other systems reviewed and are negative.  Physical Exam Updated Vital Signs BP (!) 165/76   Pulse 80   Temp 98.3 F (36.8 C)   Resp 18   LMP 07/07/1997   SpO2 96%   Physical Exam Vitals and nursing note reviewed.  Constitutional:      General: She is not in acute distress.    Appearance: Normal appearance. She is well-developed and normal weight. She is not ill-appearing, toxic-appearing or diaphoretic.  HENT:     Head: Normocephalic and atraumatic.     Right Ear: External ear normal.     Left Ear: External ear normal.     Nose: Nose normal.     Mouth/Throat:     Mouth: Mucous membranes are moist.     Pharynx: Oropharynx is clear.  Eyes:     Extraocular Movements: Extraocular movements intact.     Conjunctiva/sclera: Conjunctivae normal.  Cardiovascular:     Rate and Rhythm: Normal rate and regular rhythm.     Heart sounds: No murmur heard. Pulmonary:     Effort: Pulmonary effort is normal. No respiratory distress.     Breath sounds: Normal breath sounds. No wheezing or rales.  Chest:     Chest wall: No tenderness.  Abdominal:     Palpations: Abdomen is soft.     Tenderness: There is no abdominal tenderness. There is no guarding.  Musculoskeletal:        General: No swelling or tenderness.      Cervical back: Neck supple. No rigidity or tenderness.  Skin:    General: Skin is warm and dry.  Neurological:     General: No focal deficit present.     Mental Status: She is alert and oriented to person, place, and time.     Cranial Nerves: No cranial nerve deficit.     Sensory: No sensory deficit.     Motor: No weakness.  Psychiatric:        Mood and Affect: Mood normal.        Behavior: Behavior normal.    ED Results / Procedures / Treatments   Labs (all labs ordered are listed, but only abnormal results are displayed) Labs Reviewed  CBC WITH DIFFERENTIAL/PLATELET - Abnormal; Notable for the following components:      Result Value   WBC 3.6 (*)    RBC 3.42 (*)    Hemoglobin 11.8 (*)    HCT 35.5 (*)    MCV 103.8 (*)    MCH 34.5 (*)    Lymphs Abs 0.6 (*)    All other components within normal limits  COMPREHENSIVE METABOLIC PANEL - Abnormal; Notable for the following components:   Potassium 3.2 (*)    All other components within normal limits  RESP PANEL BY RT-PCR (FLU A&B, COVID) [id number]  CBC    EKG None  Radiology CT Thoracic Spine Wo Contrast  Result Date: 02/05/2021 CLINICAL DATA:  Persistent back pain and immobility. Patient fell again 5 days ago. Spine fracture, thoracic, traumatic. EXAM: CT THORACIC AND LUMBAR SPINE WITHOUT CONTRAST TECHNIQUE: Multidetector CT imaging of the thoracic and lumbar spine was performed without contrast. Multiplanar CT image reconstructions were also generated. COMPARISON:  MRI lumbar spine  01/28/2021. No other comparison studies. FINDINGS: CT THORACIC SPINE FINDINGS Alignment: Mild convex right scoliosis. The sagittal alignment is normal. Vertebrae: The bones are diffusely demineralized. As demonstrated on recent MRI, there are compression fractures at T10 (approximately 60% loss of vertebral body height) and T11 (approximately 80% loss of vertebral body height) which appear unchanged. Both of these fractures are associated with mild  osseous retropulsion, and demonstrated marrow edema on MRI consistent with recent or unhealed fractures. In addition, there is a superior endplate compression fracture at T8 which was not previously imaged. This results in approximately 75% loss of vertebral body height. There is a mild superior endplate compression fracture at T5, resulting in approximately 15% loss of vertebral body height. No other vertebral body fractures or focal osseous lesions. Suspected old fractures of the 8th ribs bilaterally and right 9th rib near the costovertebral junctions. Paraspinal and other soft tissues: Mild paraspinous edema at T10 and T11 supporting recent fractures at these levels. No significant paraspinal hematoma. Diffuse atherosclerosis of the aorta, great vessels and coronary arteries. Small bilateral pleural effusions with bibasilar atelectasis and underlying mild emphysema. Disc levels: No large disc herniation or high-grade spinal stenosis is identified. There is no significant acquired disc pathology at or above the T7-8 level. There is vacuum phenomenon within the T8-9 and T9-10 discs. As above, there is mild osseous retropulsion at T10 and T11 without resulting significant spinal stenosis. CT LUMBAR SPINE FINDINGS Segmentation: There are 5 lumbar type vertebral bodies. Alignment: Degenerative grade 1 anterolisthesis at L4-5. Vertebrae: Chronic compression fracture at L1 resulting in 50% loss of vertebral body height and mild osseous retropulsion is unchanged from the MRI. There are minimal superior endplate compression fractures at L2 and L3 which are also unchanged. The L3 fracture is associated with minimal osseous retropulsion. Both of these fractures were associated with mild superior endplate edema on MRI. No new fractures. Paraspinal and other soft tissues: No acute paraspinal findings. Extensive aortic and branch vessel atherosclerosis. The bladder appears mildly distended. Disc levels: The disc space findings  are unchanged from the recent MRI. At L4-5, there is a grade 1 anterolisthesis secondary to bilateral facet hypertrophy, and this contributes to mild spinal stenosis, narrowing of the lateral recesses and right foramen. IMPRESSION: 1. The subacute fractures at T10, T11, L2 and L3 demonstrated on MRI 1 week ago have not significantly changed in the interval. No progressive loss of vertebral body height or progressive osseous retropulsion. 2. In addition, there is an age-indeterminate T8 compression deformity resulting in approximately 75% loss of vertebral body height, not previously imaged. Mild T5 compression deformity, not previously imaged. 3. Stable healed chronic L1 compression fracture. 4. Mild multilevel spondylosis without high-grade spinal stenosis or large disc herniation. Stable degenerative anterolisthesis at L4-5. 5. Extensive Aortic Atherosclerosis (ICD10-I70.0). Electronically Signed   By: Carey Bullocks M.D.   On: 02/05/2021 16:33   CT Lumbar Spine Wo Contrast  Result Date: 02/05/2021 CLINICAL DATA:  Persistent back pain and immobility. Patient fell again 5 days ago. Spine fracture, thoracic, traumatic. EXAM: CT THORACIC AND LUMBAR SPINE WITHOUT CONTRAST TECHNIQUE: Multidetector CT imaging of the thoracic and lumbar spine was performed without contrast. Multiplanar CT image reconstructions were also generated. COMPARISON:  MRI lumbar spine 01/28/2021. No other comparison studies. FINDINGS: CT THORACIC SPINE FINDINGS Alignment: Mild convex right scoliosis. The sagittal alignment is normal. Vertebrae: The bones are diffusely demineralized. As demonstrated on recent MRI, there are compression fractures at T10 (approximately 60% loss of vertebral body height)  and T11 (approximately 80% loss of vertebral body height) which appear unchanged. Both of these fractures are associated with mild osseous retropulsion, and demonstrated marrow edema on MRI consistent with recent or unhealed fractures. In  addition, there is a superior endplate compression fracture at T8 which was not previously imaged. This results in approximately 75% loss of vertebral body height. There is a mild superior endplate compression fracture at T5, resulting in approximately 15% loss of vertebral body height. No other vertebral body fractures or focal osseous lesions. Suspected old fractures of the 8th ribs bilaterally and right 9th rib near the costovertebral junctions. Paraspinal and other soft tissues: Mild paraspinous edema at T10 and T11 supporting recent fractures at these levels. No significant paraspinal hematoma. Diffuse atherosclerosis of the aorta, great vessels and coronary arteries. Small bilateral pleural effusions with bibasilar atelectasis and underlying mild emphysema. Disc levels: No large disc herniation or high-grade spinal stenosis is identified. There is no significant acquired disc pathology at or above the T7-8 level. There is vacuum phenomenon within the T8-9 and T9-10 discs. As above, there is mild osseous retropulsion at T10 and T11 without resulting significant spinal stenosis. CT LUMBAR SPINE FINDINGS Segmentation: There are 5 lumbar type vertebral bodies. Alignment: Degenerative grade 1 anterolisthesis at L4-5. Vertebrae: Chronic compression fracture at L1 resulting in 50% loss of vertebral body height and mild osseous retropulsion is unchanged from the MRI. There are minimal superior endplate compression fractures at L2 and L3 which are also unchanged. The L3 fracture is associated with minimal osseous retropulsion. Both of these fractures were associated with mild superior endplate edema on MRI. No new fractures. Paraspinal and other soft tissues: No acute paraspinal findings. Extensive aortic and branch vessel atherosclerosis. The bladder appears mildly distended. Disc levels: The disc space findings are unchanged from the recent MRI. At L4-5, there is a grade 1 anterolisthesis secondary to bilateral facet  hypertrophy, and this contributes to mild spinal stenosis, narrowing of the lateral recesses and right foramen. IMPRESSION: 1. The subacute fractures at T10, T11, L2 and L3 demonstrated on MRI 1 week ago have not significantly changed in the interval. No progressive loss of vertebral body height or progressive osseous retropulsion. 2. In addition, there is an age-indeterminate T8 compression deformity resulting in approximately 75% loss of vertebral body height, not previously imaged. Mild T5 compression deformity, not previously imaged. 3. Stable healed chronic L1 compression fracture. 4. Mild multilevel spondylosis without high-grade spinal stenosis or large disc herniation. Stable degenerative anterolisthesis at L4-5. 5. Extensive Aortic Atherosclerosis (ICD10-I70.0). Electronically Signed   By: Carey BullocksWilliam  Veazey M.D.   On: 02/05/2021 16:33   DG Foot 2 Views Right  Result Date: 02/05/2021 CLINICAL DATA:  Fall.  Pain. EXAM: RIGHT FOOT - 2 VIEW COMPARISON:  None. FINDINGS: AP and lateral views. Osteopenia. Vascular calcifications. Minimally displaced, transverse fracture involving the proximal portion of the proximal phalanx of the fifth digit. No definite intra-articular extension. IMPRESSION: 1. Proximal fifth phalanx fracture. 2. Osteopenia. Electronically Signed   By: Jeronimo GreavesKyle  Talbot M.D.   On: 02/05/2021 21:15    Procedures Procedures   Medications Ordered in ED Medications  lidocaine (LIDODERM) 5 % 1 patch (1 patch Transdermal Patch Applied 02/05/21 1822)  acetaminophen (TYLENOL) tablet 650 mg (650 mg Oral Given 02/05/21 1822)  enoxaparin (LOVENOX) injection 40 mg (has no administration in time range)  ibuprofen (ADVIL) tablet 600 mg (has no administration in time range)  cyclobenzaprine (FLEXERIL) tablet 5 mg (has no administration in time range)  acetaminophen (TYLENOL) tablet 650 mg (650 mg Oral Given 02/05/21 1510)  ibuprofen (ADVIL) tablet 600 mg (600 mg Oral Given 02/05/21 1822)    ED Course  I  have reviewed the triage vital signs and the nursing notes.  Pertinent labs & imaging results that were available during my care of the patient were reviewed by me and considered in my medical decision making (see chart for details).    MDM Rules/Calculators/A&P                           Patient is a 86 year old female who presents for acute on chronic lower back pain.  She was diagnosed with multiple compression fractures in her lumbar and lower T-spine 1 week ago.  Since that time, she has had 1 additional fall.  She describes a fall was mechanical in nature.  She got up use the restroom and leaned on a chair that had wheels on it.  Because the chair rolled away from her, she ended up falling.  This occurred approximately 4 days ago.  Over the past 2 days, she has had worsening of her subacute back pain.  Pain became so severe today that patient arrives via EMS.  Vital signs upon arrival notable for elevated SBP.  Patient is found sitting upright on stretcher.  She states that her pain is only severe with certain movements.  She describes exacerbation of pain with rotational movements primarily.  Patient has intact sensation and strength in bilateral lower extremities.  She denies any red flag symptoms.  I do not suspect any spinal cord compression.  Presently, she does not have any midline areas of tenderness in her back.  She has not had any other areas of pain since her recent fall.  Patient is likely at high risk for fractures.  Given her recent fall, since her MRI, she may have suffered new fractures and/or worsening of her existing fractures.  CT scan of thoracic and lumbar spine was ordered. While in the ED, she was given Tylenol and ibuprofen for management of her pain. CT scans showed similar appearance of known fractures.  An additional compression fracture was noted at T8.  I spoke with neurosurgery regarding management.  They advised TLSO brace and 2-week follow-up.  TLSO brace was ordered.   Lidocaine patch and scheduled Tylenol was ordered.  I had multiple discussions with the patient and her husband regarding disposition.  Initially, patient stated that she would prefer to go home.  Patient's husband was concerned given her immobility and his limited ability to move her or transfer her.  Following observation in the ED, patient also stated that she would be uncomfortable going home.  Given the concerns of the patient and her husband, patient was admitted to hospitalist service for further management.  Final Clinical Impression(s) / ED Diagnoses Final diagnoses:  Trauma  Fall    Rx / DC Orders ED Discharge Orders     None        Gloris Manchester, MD 02/05/21 2119

## 2021-02-05 NOTE — H&P (Addendum)
History and Physical    Natasha Harper LDJ:[id number] DOB: Mar 03, 1935 DOA: 02/05/2021  PCP: [name], MD  Patient coming from: home, husband at bedside  I have personally briefly reviewed patient's old medical records in [facility]  Chief Complaint: Worsening back pain  HPI: Natasha Harper is a 86 y.o. female with medical history significant for carotid artery disease, hypothyroidism, hyperlipidemia who presents with concerns of worsening lower back pain and weakness.  Patient reports she has been having pain since June.  States her pain is in her mid to lower back. Pain is worse with rotation and flexion but improved with rest.  It starts midline and then spreads to her paraspinal musculature.  Denies any lower extremity numbness or tingling.  No saddle anesthesia or incontinence.    She seen [facility] in early July and reportedly had x-ray that were negative for fracture.  She was then told to do physical therapy at a pool which she cannot do due to pain.  Also given tramadol but states it just made her "simple minded" and constipation.  She then presented to orthopedic again on 7/25 and had MRI showing compression fracture at T10, T11, L1, L2 and L3.  Shortly after this MRI she had a fall at home.  States she was walking towards a chair but he had wheels and slipped out from under her causing her to land on her buttocks.  Did not hit her head or have loss of consciousness.  However since then she has been having worsening back pain.  Today she had another follow-up appointment with orthopedic blood husband who is elderly could not get her into the car due to her pain and limited mobility so they decided to present to the ED.  ED Course: She was afebrile, mildly hypertensive up to 180s on room air.  WBC of 3.6, hemoglobin of 11.8 from 13.6. CT thoracic and lumbar spine showed no significant change in previous subacute fracture seen on MRI.  There is a new age-indeterminate T8  compression fracture with vertebral body height loss.  No high-grade spinal stenosis or large disc herniation.  ED physician discussed with neurosurgery who recommended TL SLO brace as patient is nonoperable.  They recommend follow-up with them in about 2 weeks in the office.  Hospitalist on-call for admission for pain control and PT evaluation.  Review of Systems  Constitutional: No Weight Change, No Fever ENT/Mouth: No sore throat, No Rhinorrhea Eyes: No Eye Pain, No Vision Changes Cardiovascular: No Chest Pain, no SOB, Respiratory: No Cough, Gastrointestinal: No Nausea, No Vomiting, No Diarrhea Genitourinary: No Urgency, No Flank Pain Musculoskeletal: + Arthralgias, +Myalgias Skin: No Skin Lesions, No Pruritus, Neuro: no Weakness, No Numbness,  No Loss of Consciousness, No Syncope Psych: No Anxiety/Panic, No Depression, no decrease appetite Heme/Lymph: No Bruising, No Bleeding  Past Medical History:  Diagnosis Date   CAD (coronary artery disease)    PCI to RCA and diagonal in remote past, residual 70% LAD  /   nuclear, 2007, no ischemia   Carotid arterial disease (HCC)    Doppler, June, 2011, stable, 60-79% R. ICA, 40-59% LICA   Contact lens/glasses fitting    wears contacts or glasses   Dyslipidemia    Significant drop in LDL from Lipitor even though LDL remains high   Ejection fraction    EF 65%, nuclear, 2007   Fibrocystic breast    HTN (hypertension)     no med   Hypercholesterolemia  Hypothyroidism    Thyroid surgery in the past, thyroid nodules followed by Dr.Ellison   Lump or mass in breast 07/19/2007   Excised 01/12/13. B9 on pathQualifier: Diagnosis of  By: Fabian Sharp MD, Neta Mends     Osteoporosis    Prominent abdominal aortic pulsation    No abdominal aneurysm by ultrasound   Rectal fissure     Past Surgical History:  Procedure Laterality Date   BREAST BIOPSY Right 01/12/2013   Procedure: Removal of right breast mass;  Surgeon: Currie Paris, MD;  Location:  [facility];  Service: General;  Laterality: Right;   BREAST EXCISIONAL BIOPSY  9/14   scar tissue from the needle biopsy   CORONARY ANGIOPLASTY  1993   RECTOPERITONEAL FISTULA CLOSURE     THYROID CYST EXCISION       reports that she quit smoking about 62 years ago. She has never used smokeless tobacco. She reports that she does not drink alcohol and does not use drugs. Social History  Allergies  Allergen Reactions   Actonel [Risedronate Sodium]     bloating   Evista [Raloxifene]     Abdominal cramps   Fosamax [Alendronate Sodium]     Abdominal cramps    Family History  Problem Relation Age of Onset   Heart attack Father    Osteoporosis Mother    Breast cancer Daughter      Prior to Admission medications   Medication Sig Start Date End Date Taking? Authorizing Provider  Ascorbic Acid (VITAMIN C) 100 MG tablet Take 100 mg by mouth daily.    [provider]  aspirin 81 MG tablet Take 81 mg by mouth daily.    [provider]  atorvastatin (LIPITOR) 80 MG tablet TAKE 1 TABLET BY MOUTH DAILY 10/30/20   Panosh, Neta Mends, MD  baclofen (LIORESAL) 10 MG tablet Take 0.5-1 tablets (5-10 mg total) by mouth 3 (three) times daily as needed for muscle spasms. 01/28/21   Hilts, Casimiro Needle, MD  fish oil-omega-3 fatty acids 1000 MG capsule Take 2 g by mouth daily.    [provider]  MULTIPLE VITAMIN PO Take 1 tablet by mouth daily.    [provider]  SYNTHROID 50 MCG tablet TAKE 1 TABLET BY MOUTH DAILY 10/30/20   [name], MD    Physical Exam: Vitals:   02/05/21 1715 02/05/21 1815 02/05/21 1900 02/05/21 1930  BP: (!) 184/95 (!) 180/91 (!) 156/78 (!) 171/90  Pulse: 96 90 87 81  Resp: 18 18    Temp:      SpO2: 96% 97% 96% 94%    Constitutional: NAD, calm, elderly female laying flat in bed with TLSO brace  Vitals:   02/05/21 1715 02/05/21 1815 02/05/21 1900 02/05/21 1930  BP: (!) 184/95 (!) 180/91 (!) 156/78 (!) 171/90  Pulse: 96 90  87 81  Resp: 18 18    Temp:      SpO2: 96% 97% 96% 94%   Eyes: PERRL, lids and conjunctivae normal ENMT: Mucous membranes are moist.  Neck: normal, supple Respiratory: clear to auscultation bilaterally, no wheezing, no crackles. Normal respiratory effort. No accessory muscle use.  Cardiovascular: Regular rate and rhythm, no murmurs / rubs / gallops. No extremity edema. Abdomen: no tenderness, no masses palpated. Bowel sounds positive.  Musculoskeletal: no clubbing / cyanosis. No joint deformity upper and lower extremities. Unable to do back exam since patient had on TLSO brace and had intolerance to pain with any rotation. Skin: no rashes,  lesions, ulcers. No induration Neurologic: CN 2-12 grossly intact. Sensation intact. Strength 5/5 in all 4.  Psychiatric: Normal judgment and insight. Alert and oriented x 3. Normal mood.    Labs on Admission: I have personally reviewed following labs and imaging studies  CBC: Recent Labs  Lab 02/05/21 1824  WBC 3.6*  NEUTROABS 2.6  HGB 11.8*  HCT 35.5*  MCV 103.8*  PLT 259   Basic Metabolic Panel: Recent Labs  Lab 02/05/21 1824  NA 135  K 3.2*  CL 98  CO2 25  GLUCOSE 95  BUN 11  CREATININE 0.45  CALCIUM 9.1   GFR: CrCl cannot be calculated (Unknown ideal weight.). Liver Function Tests: Recent Labs  Lab 02/05/21 1824  AST 26  ALT 27  ALKPHOS 120  BILITOT 0.7  PROT 7.1  ALBUMIN 3.7   No results for input(s): LIPASE, AMYLASE in the last 168 hours. No results for input(s): AMMONIA in the last 168 hours. Coagulation Profile: No results for input(s): INR, PROTIME in the last 168 hours. Cardiac Enzymes: No results for input(s): CKTOTAL, CKMB, CKMBINDEX, TROPONINI in the last 168 hours. BNP (last 3 results) No results for input(s): PROBNP in the last 8760 hours. HbA1C: No results for input(s): HGBA1C in the last 72 hours. CBG: No results for input(s): GLUCAP in the last 168 hours. Lipid Profile: No results for input(s):  CHOL, HDL, LDLCALC, TRIG, CHOLHDL, LDLDIRECT in the last 72 hours. Thyroid Function Tests: No results for input(s): TSH, T4TOTAL, FREET4, T3FREE, THYROIDAB in the last 72 hours. Anemia Panel: No results for input(s): VITAMINB12, FOLATE, FERRITIN, TIBC, IRON, RETICCTPCT in the last 72 hours. Urine analysis:    Component Value Date/Time   COLORURINE yellow 07/19/2007 0851   APPEARANCEUR Clear 07/19/2007 0851   LABSPEC >=1.030 07/19/2007 0851   PHURINE 5.0 07/19/2007 0851   HGBUR negative 07/19/2007 0851   BILIRUBINUR n 03/18/2016 1328   PROTEINUR n 03/18/2016 1328   UROBILINOGEN negative 03/18/2016 1328   UROBILINOGEN 0.2 07/19/2007 0851   NITRITE n 03/18/2016 1328   NITRITE negative 07/19/2007 0851   LEUKOCYTESUR Negative 03/18/2016 1328    Radiological Exams on Admission: CT Thoracic Spine Wo Contrast  Result Date: 02/05/2021 CLINICAL DATA:  Persistent back pain and immobility. Patient fell again 5 days ago. Spine fracture, thoracic, traumatic. EXAM: CT THORACIC AND LUMBAR SPINE WITHOUT CONTRAST TECHNIQUE: Multidetector CT imaging of the thoracic and lumbar spine was performed without contrast. Multiplanar CT image reconstructions were also generated. COMPARISON:  MRI lumbar spine 01/28/2021. No other comparison studies. FINDINGS: CT THORACIC SPINE FINDINGS Alignment: Mild convex right scoliosis. The sagittal alignment is normal. Vertebrae: The bones are diffusely demineralized. As demonstrated on recent MRI, there are compression fractures at T10 (approximately 60% loss of vertebral body height) and T11 (approximately 80% loss of vertebral body height) which appear unchanged. Both of these fractures are associated with mild osseous retropulsion, and demonstrated marrow edema on MRI consistent with recent or unhealed fractures. In addition, there is a superior endplate compression fracture at T8 which was not previously imaged. This results in approximately 75% loss of vertebral body height.  There is a mild superior endplate compression fracture at T5, resulting in approximately 15% loss of vertebral body height. No other vertebral body fractures or focal osseous lesions. Suspected old fractures of the 8th ribs bilaterally and right 9th rib near the costovertebral junctions. Paraspinal and other soft tissues: Mild paraspinous edema at T10 and T11 supporting recent fractures at these levels. No significant paraspinal hematoma.  Diffuse atherosclerosis of the aorta, great vessels and coronary arteries. Small bilateral pleural effusions with bibasilar atelectasis and underlying mild emphysema. Disc levels: No large disc herniation or high-grade spinal stenosis is identified. There is no significant acquired disc pathology at or above the T7-8 level. There is vacuum phenomenon within the T8-9 and T9-10 discs. As above, there is mild osseous retropulsion at T10 and T11 without resulting significant spinal stenosis. CT LUMBAR SPINE FINDINGS Segmentation: There are 5 lumbar type vertebral bodies. Alignment: Degenerative grade 1 anterolisthesis at L4-5. Vertebrae: Chronic compression fracture at L1 resulting in 50% loss of vertebral body height and mild osseous retropulsion is unchanged from the MRI. There are minimal superior endplate compression fractures at L2 and L3 which are also unchanged. The L3 fracture is associated with minimal osseous retropulsion. Both of these fractures were associated with mild superior endplate edema on MRI. No new fractures. Paraspinal and other soft tissues: No acute paraspinal findings. Extensive aortic and branch vessel atherosclerosis. The bladder appears mildly distended. Disc levels: The disc space findings are unchanged from the recent MRI. At L4-5, there is a grade 1 anterolisthesis secondary to bilateral facet hypertrophy, and this contributes to mild spinal stenosis, narrowing of the lateral recesses and right foramen. IMPRESSION: 1. The subacute fractures at T10, T11,  L2 and L3 demonstrated on MRI 1 week ago have not significantly changed in the interval. No progressive loss of vertebral body height or progressive osseous retropulsion. 2. In addition, there is an age-indeterminate T8 compression deformity resulting in approximately 75% loss of vertebral body height, not previously imaged. Mild T5 compression deformity, not previously imaged. 3. Stable healed chronic L1 compression fracture. 4. Mild multilevel spondylosis without high-grade spinal stenosis or large disc herniation. Stable degenerative anterolisthesis at L4-5. 5. Extensive Aortic Atherosclerosis (ICD10-I70.0). Electronically Signed   By: Carey Bullocks M.D.   On: 02/05/2021 16:33   CT Lumbar Spine Wo Contrast  Result Date: 02/05/2021 CLINICAL DATA:  Persistent back pain and immobility. Patient fell again 5 days ago. Spine fracture, thoracic, traumatic. EXAM: CT THORACIC AND LUMBAR SPINE WITHOUT CONTRAST TECHNIQUE: Multidetector CT imaging of the thoracic and lumbar spine was performed without contrast. Multiplanar CT image reconstructions were also generated. COMPARISON:  MRI lumbar spine 01/28/2021. No other comparison studies. FINDINGS: CT THORACIC SPINE FINDINGS Alignment: Mild convex right scoliosis. The sagittal alignment is normal. Vertebrae: The bones are diffusely demineralized. As demonstrated on recent MRI, there are compression fractures at T10 (approximately 60% loss of vertebral body height) and T11 (approximately 80% loss of vertebral body height) which appear unchanged. Both of these fractures are associated with mild osseous retropulsion, and demonstrated marrow edema on MRI consistent with recent or unhealed fractures. In addition, there is a superior endplate compression fracture at T8 which was not previously imaged. This results in approximately 75% loss of vertebral body height. There is a mild superior endplate compression fracture at T5, resulting in approximately 15% loss of vertebral  body height. No other vertebral body fractures or focal osseous lesions. Suspected old fractures of the 8th ribs bilaterally and right 9th rib near the costovertebral junctions. Paraspinal and other soft tissues: Mild paraspinous edema at T10 and T11 supporting recent fractures at these levels. No significant paraspinal hematoma. Diffuse atherosclerosis of the aorta, great vessels and coronary arteries. Small bilateral pleural effusions with bibasilar atelectasis and underlying mild emphysema. Disc levels: No large disc herniation or high-grade spinal stenosis is identified. There is no significant acquired disc pathology at or above the  T7-8 level. There is vacuum phenomenon within the T8-9 and T9-10 discs. As above, there is mild osseous retropulsion at T10 and T11 without resulting significant spinal stenosis. CT LUMBAR SPINE FINDINGS Segmentation: There are 5 lumbar type vertebral bodies. Alignment: Degenerative grade 1 anterolisthesis at L4-5. Vertebrae: Chronic compression fracture at L1 resulting in 50% loss of vertebral body height and mild osseous retropulsion is unchanged from the MRI. There are minimal superior endplate compression fractures at L2 and L3 which are also unchanged. The L3 fracture is associated with minimal osseous retropulsion. Both of these fractures were associated with mild superior endplate edema on MRI. No new fractures. Paraspinal and other soft tissues: No acute paraspinal findings. Extensive aortic and branch vessel atherosclerosis. The bladder appears mildly distended. Disc levels: The disc space findings are unchanged from the recent MRI. At L4-5, there is a grade 1 anterolisthesis secondary to bilateral facet hypertrophy, and this contributes to mild spinal stenosis, narrowing of the lateral recesses and right foramen. IMPRESSION: 1. The subacute fractures at T10, T11, L2 and L3 demonstrated on MRI 1 week ago have not significantly changed in the interval. No progressive loss of  vertebral body height or progressive osseous retropulsion. 2. In addition, there is an age-indeterminate T8 compression deformity resulting in approximately 75% loss of vertebral body height, not previously imaged. Mild T5 compression deformity, not previously imaged. 3. Stable healed chronic L1 compression fracture. 4. Mild multilevel spondylosis without high-grade spinal stenosis or large disc herniation. Stable degenerative anterolisthesis at L4-5. 5. Extensive Aortic Atherosclerosis (ICD10-I70.0). Electronically Signed   By: Carey BullocksWilliam  Veazey M.D.   On: 02/05/2021 16:33      Assessment/Plan  Multilevel spinal compression fracture (T8, T10, T11, L1, L2, L3) -pt non-operable.  Neurosurgery recommends TLSO brace and follow-up outpatient in 2 weeks -pr does not want opioid for pain. Scheduled Tylenol and PRN ibuprofen. Lidocaine patch. Right now pain is bearable without movement.  -PT eval in the morning   Ecchymosis on right foot -will obtain X-ray to evaluate for fx  Hypothyroidism -Continue Synthroid  Hyperlipidemia -Continue statin  Cystic lesion in right pelvis -incidental finding on MRI  -recommend follow up ultrasound outpatient   Multiple cystic lesions in RUQ quadrant/pancreatic head -incidental finding on MRI  -LFTs normal -outpatient follow up MRCP   DVT prophylaxis:.Lovenox Code Status: Full Family Communication: Plan discussed with patient at bedside  disposition Plan: Home with observation Consults called:  Admission status: Observation  Level of care: Med-Surg  Status is: Observation  The patient remains OBS appropriate and will d/c before 2 midnights.  Dispo: The patient is from: Home              Anticipated d/c is to: Home              Patient currently is not medically stable to d/c.   Difficult to place patient No         Anselm Junglinghing T Maeby Vankleeck DO [facility]   If 7PM-7AM, please contact night-coverage [email or web address]   02/05/2021, 8:10 PM

## 2021-02-05 NOTE — ED Triage Notes (Signed)
Ems brings pt in from home for back pain. Pt had MRI early last week. Pt had another fall 5 days ago. Pt still reports back pain and less mobility.

## 2021-02-05 NOTE — ED Notes (Signed)
Ortho tech at bedside putting pt's back brace on.

## 2021-02-05 NOTE — Telephone Encounter (Signed)
Pts husband Theron Arista called stating the pt is basically paralyzed and there's no way he'll be able to get her in the car for her appt today at 2. Theron Arista would like Dr.Hilts to have the pt admitted and would like a CB ASAP.   [phone]

## 2021-02-05 NOTE — Progress Notes (Signed)
Orthopedic Tech Progress Note Patient Details:  Natasha Harper 1935/03/15 [id number]         Saul Fordyce 02/05/2021, 5:55 PMCalled [facility] for TLSO brace.

## 2021-02-06 DIAGNOSIS — I10 Essential (primary) hypertension: Secondary | ICD-10-CM | POA: Diagnosis not present

## 2021-02-06 DIAGNOSIS — S22060A Wedge compression fracture of T7-T8 vertebra, initial encounter for closed fracture: Secondary | ICD-10-CM

## 2021-02-06 DIAGNOSIS — S32000D Wedge compression fracture of unspecified lumbar vertebra, subsequent encounter for fracture with routine healing: Secondary | ICD-10-CM | POA: Diagnosis not present

## 2021-02-06 DIAGNOSIS — E039 Hypothyroidism, unspecified: Secondary | ICD-10-CM

## 2021-02-06 DIAGNOSIS — S32000A Wedge compression fracture of unspecified lumbar vertebra, initial encounter for closed fracture: Secondary | ICD-10-CM

## 2021-02-06 DIAGNOSIS — S22000A Wedge compression fracture of unspecified thoracic vertebra, initial encounter for closed fracture: Secondary | ICD-10-CM

## 2021-02-06 LAB — CBC
HCT: 31 % — ABNORMAL LOW (ref 36.0–46.0)
Hemoglobin: 10.2 g/dL — ABNORMAL LOW (ref 12.0–15.0)
MCH: 35.1 pg — ABNORMAL HIGH (ref 26.0–34.0)
MCHC: 32.9 g/dL (ref 30.0–36.0)
MCV: 106.5 fL — ABNORMAL HIGH (ref 80.0–100.0)
Platelets: 236 10*3/uL (ref 150–400)
RBC: 2.91 MIL/uL — ABNORMAL LOW (ref 3.87–5.11)
RDW: 14.6 % (ref 11.5–15.5)
WBC: 3.7 10*3/uL — ABNORMAL LOW (ref 4.0–10.5)
nRBC: 0 % (ref 0.0–0.2)

## 2021-02-06 LAB — RESP PANEL BY RT-PCR (FLU A&B, COVID) ARPGX2
Influenza A by PCR: NEGATIVE
Influenza B by PCR: NEGATIVE
SARS Coronavirus 2 by RT PCR: NEGATIVE

## 2021-02-06 MED ORDER — LEVOTHYROXINE SODIUM 50 MCG PO TABS
50.0000 ug | ORAL_TABLET | Freq: Every day | ORAL | Status: DC
  Administered 2021-02-06 – 2021-02-10 (×5): 50 ug via ORAL
  Filled 2021-02-06 (×5): qty 1

## 2021-02-06 MED ORDER — VITAMIN D 25 MCG (1000 UNIT) PO TABS
1000.0000 [IU] | ORAL_TABLET | Freq: Every day | ORAL | Status: DC
  Administered 2021-02-06 – 2021-02-10 (×5): 1000 [IU] via ORAL
  Filled 2021-02-06 (×5): qty 1

## 2021-02-06 MED ORDER — POTASSIUM CHLORIDE CRYS ER 20 MEQ PO TBCR
40.0000 meq | EXTENDED_RELEASE_TABLET | ORAL | Status: AC
  Administered 2021-02-06 (×2): 40 meq via ORAL
  Filled 2021-02-06 (×2): qty 2

## 2021-02-06 MED ORDER — VITAMIN C 250 MG PO TABS
125.0000 mg | ORAL_TABLET | Freq: Every day | ORAL | Status: DC
  Administered 2021-02-06 – 2021-02-10 (×5): 125 mg via ORAL
  Filled 2021-02-06 (×5): qty 1

## 2021-02-06 MED ORDER — OMEGA-3-ACID ETHYL ESTERS 1 G PO CAPS
1.0000 g | ORAL_CAPSULE | Freq: Every day | ORAL | Status: DC
  Administered 2021-02-06 – 2021-02-10 (×5): 1 g via ORAL
  Filled 2021-02-06 (×5): qty 1

## 2021-02-06 MED ORDER — ASPIRIN EC 81 MG PO TBEC
81.0000 mg | DELAYED_RELEASE_TABLET | Freq: Every day | ORAL | Status: DC
  Administered 2021-02-06 – 2021-02-10 (×5): 81 mg via ORAL
  Filled 2021-02-06 (×5): qty 1

## 2021-02-06 MED ORDER — ATORVASTATIN CALCIUM 40 MG PO TABS
80.0000 mg | ORAL_TABLET | Freq: Every day | ORAL | Status: DC
  Administered 2021-02-06 – 2021-02-10 (×5): 80 mg via ORAL
  Filled 2021-02-06 (×5): qty 2

## 2021-02-06 MED ORDER — OMEGA-3 FATTY ACIDS 1000 MG PO CAPS: 1000.0000 mg | ORAL_CAPSULE | Freq: Every day | ORAL | Status: DC

## 2021-02-06 NOTE — Evaluation (Signed)
Physical Therapy Evaluation Patient Details Name: Natasha Harper MRN: [id number] DOB: [birth year] Today's Date: 02/06/2021   History of Present Illness  Natasha Harper is a 86 y.o. female with medical history significant for carotid artery disease, hypothyroidism, hyperlipidemia who presents with concerns of worsening lower back pain and weakness.Followed by [facility]- MRI showing compression fracture at T10, T11, L1, L2 and L3.In ED CT thoracic and lumbar spine showed no significant change in previous subacute fracture seen on MRI.  There is a new age-indeterminate T8 compression fracture with vertebral body height loss., NS recommended TLSO  Clinical Impression  Patient admitted with compression fractures of thoracic and lumbar with Possible new fractureT8 and pain/ decreased ability to ambulate.  Patient resting in bed, TLSO in place, thoracic support riding at patient's neck/throat, having to pull it away to prevent ir from choking.her. Max assist to sit up onto bedside . Patient reporting increased pressure on  neck/throat when  sitting. PT Unable to adjust the brace down on her trunk due to brace resting on patient thighs.max assist to return back into semi seated on gurney, keeping HOB raised.  Patient did stand on a stool at RW and stepped in place x 2( Patient short and feet did not touch floor when seated so foot stool was used.) Patient  reports intermittent escalation of pain, especially when UE's were not supporting trunk to to take pressure  off of back.  Spouse came in, states another  brace had been ordered when followed by [facility], unsure of it's style, informed pt/spouse that neurosurgery recommended TLSO.  Call made to [facility] to request the TLSO be check for fit as patient  not tolerating the pressure on the throat and clavicles.Person spoken to said someone would be by within the hour.  Patient not interested in SNF but informed patient that  her progress and pain will need  to improve in order to return home with spouse assisting. Patient's daughter is not able. Pt admitted with above diagnosis.  Pt currently with functional limitations due to the deficits listed below (see PT Problem List). Pt will benefit from skilled PT to increase their independence and safety with mobility to allow discharge to the venue listed below.     Follow Up Recommendations Home health PT;Supervision/Assistance - 24 hour vs SNF, depending on progress.    Equipment Recommendations  Rolling walker with 5" wheels    Recommendations for Other Services OT consult     Precautions / Restrictions Precautions Precautions: Fall Required Braces or Orthoses: Spinal Brace Spinal Brace: Thoracolumbosacral orthotic      Mobility  Bed Mobility Overal bed mobility: Needs Assistance Bed Mobility: Supine to Sit;Sit to Supine     Supine to sit: HOB elevated;Max assist Sit to supine: Max assist   General bed mobility comments: HOB raised, patient assisted  moving legs to bed edge. Max assist to SLOWLY raise trunk,patient with C/O 10/10 pain. Once in sitting and feet supported on stool, patient reports decreased pain as long as she maximally support s with UE's.. Patient required support of the legs back onto bed, HOB raised maximally.    Transfers Overall transfer level: Needs assistance Equipment used: Rolling walker (2 wheeled) Transfers: Sit to/from Stand Sit to Stand: Mod assist         General transfer comment: Steady assist to rise to stand(ON stool  due  to patient small stature and feet  do not touch floor  on gurney).Patient supported on RW, Patient with  difficulty transitioning  hands to RW due to supporting trunk .  Ambulation/Gait Ambulation/Gait assistance: Mod assist   Assistive device: Rolling walker (2 wheeled)       General Gait Details: patient abble to step in place x 4 while supporting on RW.  Stairs            Wheelchair Mobility    Modified  Rankin (Stroke Patients Only)       Balance Overall balance assessment: Needs assistance;History of Falls Sitting-balance support: Bilateral upper extremity supported;Feet supported Sitting balance-[name]: Fair Sitting balance - Comments: max support of UE's to decrease back pain   Standing balance support: Bilateral upper extremity supported;During functional activity Standing balance-[name]: Poor Standing balance comment: max support on RW.                             Pertinent Vitals/Pain Pain Assessment: 0-10 Pain Score: 10-Worst pain ever Pain Location: low to mid back Pain Descriptors / Indicators: Crying;Discomfort;Grimacing;Guarding;Jabbing;Moaning Pain Intervention(s): Monitored during session;Limited activity within patient's tolerance;Premedicated before session;Repositioned    Home Living Family/patient expects to be discharged to:: Private residence Living Arrangements: Spouse/significant other Available Help at Discharge: Family;Available 24 hours/day Type of Home: House Home Access: Level entry     Home Layout: One level Home Equipment: None      Prior Function Level of Independence: Independent         Comments: until  back issues     Hand Dominance   Dominant Hand: Right    Extremity/Trunk Assessment   Upper Extremity Assessment Upper Extremity Assessment: Overall WFL for tasks assessed    Lower Extremity Assessment Lower Extremity Assessment: Generalized weakness    Cervical / Trunk Assessment Cervical / Trunk Assessment: Kyphotic  Communication   Communication: No difficulties;HOH  Cognition Arousal/Alertness: Awake/alert Behavior During Therapy: WFL for tasks assessed/performed;Anxious Overall Cognitive Status: Within Functional Limits for tasks assessed                                 General Comments: anxious about moving and back pain, also TLSO pushing on throat and clavicles      General  Comments      Exercises     Assessment/Plan    PT Assessment Patient needs continued PT services  PT Problem List Decreased strength;Decreased knowledge of precautions;Decreased mobility;Decreased knowledge of use of DME;Decreased activity tolerance;Decreased safety awareness;Pain       PT Treatment Interventions DME instruction;Therapeutic activities;Gait training;Therapeutic exercise;Patient/family education;Functional mobility training;Balance training    PT Goals (Current goals can be found in the Care Plan section)  Acute Rehab PT Goals Patient Stated Goal: to not be in pain, go home PT Goal Formulation: With patient/family Time For Goal Achievement: 02/20/21 Potential to Achieve Goals: Good    Frequency Min 3X/week   Barriers to discharge        Co-evaluation               AM-PAC PT "6 Clicks" Mobility  Outcome Measure Help needed turning from your back to your side while in a flat bed without using bedrails?: A Lot Help needed moving from lying on your back to sitting on the side of a flat bed without using bedrails?: A Lot Help needed moving to and from a bed to a chair (including a wheelchair)?: A Lot Help needed standing up from a chair using your  arms (e.g., wheelchair or bedside chair)?: A Lot Help needed to walk in hospital room?: Total Help needed climbing 3-5 steps with a railing? : Total 6 Click Score: 10    End of Session   Activity Tolerance: Patient limited by pain Patient left: in bed;with call bell/phone within reach Nurse Communication: Mobility status PT Visit Diagnosis: Unsteadiness on feet (R26.81);Difficulty in walking, not elsewhere classified (R26.2);Pain    Time: 9163-8466 PT Time Calculation (min) (ACUTE ONLY): 51 min   Charges:   PT Evaluation $PT Eval Moderate Complexity: 1 Mod PT Treatments $Therapeutic Activity: 23-37 mins        Blanchard Kelch PT Acute Rehabilitation Services Pager [phone] Office  [phone]   Rada Hay 02/06/2021, 10:22 AM

## 2021-02-06 NOTE — Progress Notes (Addendum)
[facility]  PROGRESS NOTE  Natasha Harper BJS:[id number] DOB: 06-Apr-1935 DOA: 02/05/2021 PCP: [name], MD   Brief HPI:   86 year old female with history of carotid artery disease, hypothyroidism, hyperlipidemia presented with worsening lower back pain and generalized weakness. She was seen by [facility] in early July and reportedly had x-ray that were negative for fracture.  She was then told to do physical therapy at a pool which she cannot do due to pain.  Also given tramadol but states it just made her "simple minded" and constipation.  She then presented to orthopedic again on 7/25 and had MRI showing compression fracture at T10, T11, L1, L2 and L3.  Shortly after this MRI she had a fall at home.  States she was walking towards a chair but he had wheels and slipped out from under her causing her to land on her buttocks.  Did not hit her head or have loss of consciousness.  However since then she has been having worsening back pain. She was brought to the ED due to limited mobility and pain.   CT thoracic and lumbar spine showed no significant change in previous subacute fracture seen on MRI.  There is a new age-indeterminate T8 compression fracture with vertebral body height loss.  No high-grade spinal stenosis or large disc herniation.  ED provider discussed with neurosurgery, they recommended TLSO brace.  Subjective   Patient seen and examined, denies pain.   Assessment/Plan:    Spinal compression fracture, T8, T10, T11, L1, L2, L3 -Nonoperable as per neurosurgery -Recommends TLSO brace and outpatient follow-up with 2 weeks -Patient wants to avoid opioids for pain -Continue Tylenol as needed, ibuprofen as needed, lidocaine patch -PT eval  Left fifth proximal phalanx fracture -Seen on x-ray of the foot -We will consult orthopedic surgery  Hypothyroidism -Continue Synthroid  Hyperlipidemia -Continue statin  Cystic lesion in right pelvis -Incidental finding  on MRI -Recommend follow-up ultrasound as outpatient  Multiple cystic lesions in right upper quadrant/pancreatic head -Incidental finding on MRI -LFTs are normal -Outpatient follow-up MRCP  Hypokalemia -Potassium was 3.2 -Replace potassium K. Dur 40 mEq x 2 -Follow BMP in am     Scheduled medications:    acetaminophen  650 mg Oral Q6H   aspirin EC  81 mg Oral Daily   atorvastatin  80 mg Oral Daily   cholecalciferol  1,000 Units Oral Daily   enoxaparin (LOVENOX) injection  40 mg Subcutaneous Q24H   levothyroxine  50 mcg Oral Q0600   lidocaine  1 patch Transdermal Q24H   omega-3 acid ethyl esters  1 g Oral Daily   vitamin C  125 mg Oral Daily         Data Reviewed:   CBG:  No results for input(s): GLUCAP in the last 168 hours.  SpO2: 96 %    Vitals:   02/06/21 1100 02/06/21 1330 02/06/21 1400 02/06/21 1442  BP: (!) 145/72 (!) 146/71 (!) 146/77 (!) 155/63  Pulse: 68 71 77 83  Resp: 18 16 16 20   Temp:   98.6 F (37 C) 98.2 F (36.8 C)  TempSrc:   Oral Oral  SpO2: 92% 93% 95% 96%    No intake or output data in the 24 hours ending 02/06/21 1510  No intake/output data recorded.  There were no vitals filed for this visit.  CBC:  Recent Labs  Lab 02/05/21 1824 02/06/21 0500  WBC 3.6* 3.7*  HGB 11.8* 10.2*  HCT 35.5* 31.0*  PLT 259 236  MCV 103.8* 106.5*  MCH 34.5* 35.1*  MCHC 33.2 32.9  RDW 14.4 14.6  LYMPHSABS 0.6*  --   MONOABS 0.3  --   EOSABS 0.0  --   BASOSABS 0.0  --     Complete metabolic panel:  Recent Labs  Lab 02/05/21 1824  NA 135  K 3.2*  CL 98  CO2 25  GLUCOSE 95  BUN 11  CREATININE 0.45  CALCIUM 9.1  AST 26  ALT 27  ALKPHOS 120  BILITOT 0.7  ALBUMIN 3.7    No results for input(s): LIPASE, AMYLASE in the last 168 hours.  Recent Labs  Lab 02/05/21 2030  SARSCOV2NAA NEGATIVE    ------------------------------------------------------------------------------------------------------------------ No results for  input(s): CHOL, HDL, LDLCALC, TRIG, CHOLHDL, LDLDIRECT in the last 72 hours.  Lab Results  Component Value Date   HGBA1C 5.6 11/25/2017   ------------------------------------------------------------------------------------------------------------------ No results for input(s): TSH, T4TOTAL, T3FREE, THYROIDAB in the last 72 hours.  Invalid input(s): FREET3 ------------------------------------------------------------------------------------------------------------------ No results for input(s): VITAMINB12, FOLATE, FERRITIN, TIBC, IRON, RETICCTPCT in the last 72 hours.  Coagulation profile No results for input(s): INR, PROTIME in the last 168 hours. No results for input(s): DDIMER in the last 72 hours.  Cardiac Enzymes No results for input(s): CKTOTAL, CKMB, CKMBINDEX, TROPONINI in the last 168 hours.  ------------------------------------------------------------------------------------------------------------------ No results found for: BNP   Antibiotics: Anti-infectives (From admission, onward)    None        Radiology Reports  DG Pelvis 1-2 Views  Result Date: 02/05/2021 CLINICAL DATA:  Fall. EXAM: PELVIS - 1-2 VIEW COMPARISON:  None. FINDINGS: Overlying artifact obscures assessment of the left sacrum and sacroiliac joints. No evidence of acute pelvic fracture. Both femoral heads are seated. Pubic rami appear intact. Pubic symphysis is congruent. The bones are diffusely under mineralized. IMPRESSION: No evidence of pelvic fracture. Overlying artifact obscures assessment of the left sacrum and sacroiliac joints. Electronically Signed   By: Narda RutherfordMelanie  Sanford M.D.   On: 02/05/2021 21:16   CT Thoracic Spine Wo Contrast  Result Date: 02/05/2021 CLINICAL DATA:  Persistent back pain and immobility. Patient fell again 5 days ago. Spine fracture, thoracic, traumatic. EXAM: CT THORACIC AND LUMBAR SPINE WITHOUT CONTRAST TECHNIQUE: Multidetector CT imaging of the thoracic and lumbar spine  was performed without contrast. Multiplanar CT image reconstructions were also generated. COMPARISON:  MRI lumbar spine 01/28/2021. No other comparison studies. FINDINGS: CT THORACIC SPINE FINDINGS Alignment: Mild convex right scoliosis. The sagittal alignment is normal. Vertebrae: The bones are diffusely demineralized. As demonstrated on recent MRI, there are compression fractures at T10 (approximately 60% loss of vertebral body height) and T11 (approximately 80% loss of vertebral body height) which appear unchanged. Both of these fractures are associated with mild osseous retropulsion, and demonstrated marrow edema on MRI consistent with recent or unhealed fractures. In addition, there is a superior endplate compression fracture at T8 which was not previously imaged. This results in approximately 75% loss of vertebral body height. There is a mild superior endplate compression fracture at T5, resulting in approximately 15% loss of vertebral body height. No other vertebral body fractures or focal osseous lesions. Suspected old fractures of the 8th ribs bilaterally and right 9th rib near the costovertebral junctions. Paraspinal and other soft tissues: Mild paraspinous edema at T10 and T11 supporting recent fractures at these levels. No significant paraspinal hematoma. Diffuse atherosclerosis of the aorta, great vessels and coronary arteries. Small bilateral pleural effusions with bibasilar atelectasis and underlying mild emphysema. Disc levels: No large disc herniation  or high-grade spinal stenosis is identified. There is no significant acquired disc pathology at or above the T7-8 level. There is vacuum phenomenon within the T8-9 and T9-10 discs. As above, there is mild osseous retropulsion at T10 and T11 without resulting significant spinal stenosis. CT LUMBAR SPINE FINDINGS Segmentation: There are 5 lumbar type vertebral bodies. Alignment: Degenerative grade 1 anterolisthesis at L4-5. Vertebrae: Chronic compression  fracture at L1 resulting in 50% loss of vertebral body height and mild osseous retropulsion is unchanged from the MRI. There are minimal superior endplate compression fractures at L2 and L3 which are also unchanged. The L3 fracture is associated with minimal osseous retropulsion. Both of these fractures were associated with mild superior endplate edema on MRI. No new fractures. Paraspinal and other soft tissues: No acute paraspinal findings. Extensive aortic and branch vessel atherosclerosis. The bladder appears mildly distended. Disc levels: The disc space findings are unchanged from the recent MRI. At L4-5, there is a grade 1 anterolisthesis secondary to bilateral facet hypertrophy, and this contributes to mild spinal stenosis, narrowing of the lateral recesses and right foramen. IMPRESSION: 1. The subacute fractures at T10, T11, L2 and L3 demonstrated on MRI 1 week ago have not significantly changed in the interval. No progressive loss of vertebral body height or progressive osseous retropulsion. 2. In addition, there is an age-indeterminate T8 compression deformity resulting in approximately 75% loss of vertebral body height, not previously imaged. Mild T5 compression deformity, not previously imaged. 3. Stable healed chronic L1 compression fracture. 4. Mild multilevel spondylosis without high-grade spinal stenosis or large disc herniation. Stable degenerative anterolisthesis at L4-5. 5. Extensive Aortic Atherosclerosis (ICD10-I70.0). Electronically Signed   By: Carey Bullocks M.D.   On: 02/05/2021 16:33   CT Lumbar Spine Wo Contrast  Result Date: 02/05/2021 CLINICAL DATA:  Persistent back pain and immobility. Patient fell again 5 days ago. Spine fracture, thoracic, traumatic. EXAM: CT THORACIC AND LUMBAR SPINE WITHOUT CONTRAST TECHNIQUE: Multidetector CT imaging of the thoracic and lumbar spine was performed without contrast. Multiplanar CT image reconstructions were also generated. COMPARISON:  MRI lumbar  spine 01/28/2021. No other comparison studies. FINDINGS: CT THORACIC SPINE FINDINGS Alignment: Mild convex right scoliosis. The sagittal alignment is normal. Vertebrae: The bones are diffusely demineralized. As demonstrated on recent MRI, there are compression fractures at T10 (approximately 60% loss of vertebral body height) and T11 (approximately 80% loss of vertebral body height) which appear unchanged. Both of these fractures are associated with mild osseous retropulsion, and demonstrated marrow edema on MRI consistent with recent or unhealed fractures. In addition, there is a superior endplate compression fracture at T8 which was not previously imaged. This results in approximately 75% loss of vertebral body height. There is a mild superior endplate compression fracture at T5, resulting in approximately 15% loss of vertebral body height. No other vertebral body fractures or focal osseous lesions. Suspected old fractures of the 8th ribs bilaterally and right 9th rib near the costovertebral junctions. Paraspinal and other soft tissues: Mild paraspinous edema at T10 and T11 supporting recent fractures at these levels. No significant paraspinal hematoma. Diffuse atherosclerosis of the aorta, great vessels and coronary arteries. Small bilateral pleural effusions with bibasilar atelectasis and underlying mild emphysema. Disc levels: No large disc herniation or high-grade spinal stenosis is identified. There is no significant acquired disc pathology at or above the T7-8 level. There is vacuum phenomenon within the T8-9 and T9-10 discs. As above, there is mild osseous retropulsion at T10 and T11 without resulting significant spinal  stenosis. CT LUMBAR SPINE FINDINGS Segmentation: There are 5 lumbar type vertebral bodies. Alignment: Degenerative grade 1 anterolisthesis at L4-5. Vertebrae: Chronic compression fracture at L1 resulting in 50% loss of vertebral body height and mild osseous retropulsion is unchanged from the  MRI. There are minimal superior endplate compression fractures at L2 and L3 which are also unchanged. The L3 fracture is associated with minimal osseous retropulsion. Both of these fractures were associated with mild superior endplate edema on MRI. No new fractures. Paraspinal and other soft tissues: No acute paraspinal findings. Extensive aortic and branch vessel atherosclerosis. The bladder appears mildly distended. Disc levels: The disc space findings are unchanged from the recent MRI. At L4-5, there is a grade 1 anterolisthesis secondary to bilateral facet hypertrophy, and this contributes to mild spinal stenosis, narrowing of the lateral recesses and right foramen. IMPRESSION: 1. The subacute fractures at T10, T11, L2 and L3 demonstrated on MRI 1 week ago have not significantly changed in the interval. No progressive loss of vertebral body height or progressive osseous retropulsion. 2. In addition, there is an age-indeterminate T8 compression deformity resulting in approximately 75% loss of vertebral body height, not previously imaged. Mild T5 compression deformity, not previously imaged. 3. Stable healed chronic L1 compression fracture. 4. Mild multilevel spondylosis without high-grade spinal stenosis or large disc herniation. Stable degenerative anterolisthesis at L4-5. 5. Extensive Aortic Atherosclerosis (ICD10-I70.0). Electronically Signed   By: Carey Bullocks M.D.   On: 02/05/2021 16:33   DG Foot 2 Views Right  Result Date: 02/05/2021 CLINICAL DATA:  Fall.  Pain. EXAM: RIGHT FOOT - 2 VIEW COMPARISON:  None. FINDINGS: AP and lateral views. Osteopenia. Vascular calcifications. Minimally displaced, transverse fracture involving the proximal portion of the proximal phalanx of the fifth digit. No definite intra-articular extension. IMPRESSION: 1. Proximal fifth phalanx fracture. 2. Osteopenia. Electronically Signed   By: Jeronimo Greaves M.D.   On: 02/05/2021 21:15      DVT prophylaxis: Lovenox  Code  Status: Full code  Family Communication: No family at bedside   Consultants:   Procedures:     Objective    Physical Examination:   General-appears in no acute distress Heart-S1-S2, regular, no murmur auscultated Lungs-clear to auscultation bilaterally, no wheezing or crackles auscultated Abdomen-soft, nontender, no organomegaly Extremities-no edema in the lower extremities Neuro-alert, oriented x3, no focal deficit noted Status is: Inpatient  Dispo: The patient is from: Home              Anticipated d/c is to: Skilled nursing facility              Anticipated d/c date is: 02/08/2021              Patient currently not stable for discharge  Barrier to discharge-back pain  COVID-19 Labs  No results for input(s): DDIMER, FERRITIN, LDH, CRP in the last 72 hours.  Lab Results  Component Value Date   SARSCOV2NAA NEGATIVE 02/05/2021    Microbiology  Recent Results (from the past 240 hour(s))  Resp Panel by RT-PCR (Flu A&B, Covid) Nasopharyngeal Swab     Status: None   Collection Time: 02/05/21  8:30 PM   Specimen: Nasopharyngeal Swab; Nasopharyngeal(NP) swabs in vial transport medium  Result Value Ref Range Status   SARS Coronavirus 2 by RT PCR NEGATIVE NEGATIVE Final    Comment: (NOTE) SARS-CoV-2 target nucleic acids are NOT DETECTED.  The SARS-CoV-2 RNA is generally detectable in upper respiratory specimens during the acute phase of infection. The lowest concentration of  SARS-CoV-2 viral copies this assay can detect is 138 copies/mL. A negative result does not preclude SARS-Cov-2 infection and should not be used as the sole basis for treatment or other patient management decisions. A negative result may occur with  improper specimen collection/handling, submission of specimen other than nasopharyngeal swab, presence of viral mutation(s) within the areas targeted by this assay, and inadequate number of viral copies(<138 copies/mL). A negative result must be  combined with clinical observations, patient history, and epidemiological information. The expected result is Negative.  Fact Sheet for Patients:  BloggerCourse.com  Fact Sheet for Healthcare Providers:  SeriousBroker.it  This test is no t yet approved or cleared by the [place] FDA and  has been authorized for detection and/or diagnosis of SARS-CoV-2 by FDA under an Emergency Use Authorization (EUA). This EUA will remain  in effect (meaning this test can be used) for the duration of the COVID-19 declaration under Section 564(b)(1) of the Act, 21 U.S.C.section 360bbb-3(b)(1), unless the authorization is terminated  or revoked sooner.    specimens during the acute phase of infection. The lowest concentration of SARS-CoV-2 viral copies this assay can detect is 138 copies/mL. A negative result does not preclude SARS-Cov-2 infection and should not be used as the sole basis for treatment or other patient management decisions. A negative result may occur with  improper specimen collection/handling, submission of specimen other than nasopharyngeal swab, presence of viral mutation(s) within the areas targeted by this assay, and inadequate number of viral copies(<138 copies/mL). A negative result must be c ombined with clinical observations, patient history, and epidemiological information. The expected result is Negative.  Fact Sheet for Patients:  BloggerCourse.com  Fact Sheet for Healthcare Providers:  SeriousBroker.it  This test is not yet approved or cleared by the [place] FDA and  has been authorized for detection and/or diagnosis of SARS-CoV-2 by FDA under an Emergency Use Authorization (EUA). This EUA will remain  in effect (meaning this test can be used) for the duration of the COVID-19 declaration under Section 564(b)(1) of the Act, 21 U.S.C.section 360bbb-3(b)(1),  unless the authorization is terminated  or revoked sooner.       Influenza A by PCR NEGATIVE NEGATIVE Final   Influenza B by PCR NEGATIVE NEGATIVE Final    Comment: (NOTE) The Xpert Xpress SARS-CoV-2/FLU/RSV plus assay is intended as an aid in the diagnosis of influenza from Nasopharyngeal swab specimens and should not be used as a sole basis for treatment. Nasal washings and aspirates are unacceptable for Xpert Xpress SARS-CoV-2/FLU/RSV testing.  Fact Sheet for Patients: BloggerCourse.com  Fact Sheet for Healthcare Providers: SeriousBroker.it  This test is not yet approved or cleared by the [place] FDA and has been authorized for detection and/or diagnosis of SARS-CoV-2 by FDA under an Emergency Use Authorization (EUA). This EUA will remain in effect (meaning this test can be used) for the duration of the COVID-19 declaration under Section 564(b)(1) of the Act, 21 U.S.C. section 360bbb-3(b)(1), unless the authorization is terminated or revoked.  Performed at [facility], [address] [place], [place] [address]          Meredeth Ide   [facility] If 7PM-7AM, please contact night-coverage at [email or web address], Office  [phone]   02/06/2021, 3:10 PM  LOS: 0 days

## 2021-02-07 DIAGNOSIS — S32000D Wedge compression fracture of unspecified lumbar vertebra, subsequent encounter for fracture with routine healing: Secondary | ICD-10-CM | POA: Diagnosis not present

## 2021-02-07 DIAGNOSIS — S22060A Wedge compression fracture of T7-T8 vertebra, initial encounter for closed fracture: Secondary | ICD-10-CM | POA: Diagnosis not present

## 2021-02-07 DIAGNOSIS — I10 Essential (primary) hypertension: Secondary | ICD-10-CM | POA: Diagnosis not present

## 2021-02-07 DIAGNOSIS — E039 Hypothyroidism, unspecified: Secondary | ICD-10-CM | POA: Diagnosis not present

## 2021-02-07 LAB — BASIC METABOLIC PANEL
Anion gap: 8 (ref 5–15)
BUN: 23 mg/dL (ref 8–23)
CO2: 25 mmol/L (ref 22–32)
Calcium: 8.9 mg/dL (ref 8.9–10.3)
Chloride: 102 mmol/L (ref 98–111)
Creatinine, Ser: 0.48 mg/dL (ref 0.44–1.00)
GFR, Estimated: >60 mL/min (ref >60)
Glucose, Bld: 81 mg/dL (ref 70–99)
Potassium: 4.6 mmol/L (ref 3.5–5.1)
Sodium: 135 mmol/L (ref 135–145)

## 2021-02-07 LAB — CBC
HCT: 34.5 % — ABNORMAL LOW (ref 36.0–46.0)
Hemoglobin: 11.6 g/dL — ABNORMAL LOW (ref 12.0–15.0)
MCH: 35.5 pg — ABNORMAL HIGH (ref 26.0–34.0)
MCHC: 33.6 g/dL (ref 30.0–36.0)
MCV: 105.5 fL — ABNORMAL HIGH (ref 80.0–100.0)
Platelets: 263 10*3/uL (ref 150–400)
RBC: 3.27 MIL/uL — ABNORMAL LOW (ref 3.87–5.11)
RDW: 14.5 % (ref 11.5–15.5)
WBC: 4.3 10*3/uL (ref 4.0–10.5)
nRBC: 0 % (ref 0.0–0.2)

## 2021-02-07 MED ORDER — HYDRALAZINE HCL 25 MG PO TABS
25.0000 mg | ORAL_TABLET | Freq: Four times a day (QID) | ORAL | Status: DC | PRN
  Administered 2021-02-07 – 2021-02-09 (×2): 25 mg via ORAL
  Filled 2021-02-07 (×2): qty 1

## 2021-02-07 MED ORDER — AMLODIPINE BESYLATE 5 MG PO TABS
5.0000 mg | ORAL_TABLET | Freq: Every day | ORAL | Status: DC
  Administered 2021-02-07 – 2021-02-10 (×4): 5 mg via ORAL
  Filled 2021-02-07 (×4): qty 1

## 2021-02-07 NOTE — Evaluation (Signed)
Occupational Therapy Evaluation Patient Details Name: Natasha Harper MRN: [id number] DOB: 1934/07/27 Today's Date: 02/07/2021    History of Present Illness Natasha Harper is a 86 y.o. female with medical history significant for carotid artery disease, hypothyroidism, hyperlipidemia who presents with concerns of worsening lower back pain and weakness.Followed by [facility]- MRI showing compression fracture at T10, T11, L1, L2 and L3.In ED CT thoracic and lumbar spine showed no significant change in previous subacute fracture seen on MRI.  There is a new age-indeterminate T8 compression fracture with vertebral body height loss., NS recommended TLSO, Left 5th proximal phalanx fracture( No WB orders)   Clinical Impression   Patient lives with spouse in a single level house with level entry, independent at baseline. Patient currently needing increased assist with all ADLs due to pain limiting activity tolerance, balance, safety. Patient set up to min A for UB ADL, max to total A LB ADL and min x2 for functional transfers. Patient able to void on commode, needing total A for perianal care due to heavily reliant on walker. Patient also needing assistance to turn walker during transfers, reports pain that radiates to her back when trying to push walker herself with [employer]. Due to functional impairments would recommend rehab at D/C, acute OT to follow.    Follow Up Recommendations  SNF    Equipment Recommendations  3 in 1 bedside commode;Tub/shower seat       Precautions / Restrictions Precautions Precautions: Fall Required Braces or Orthoses: Spinal Brace Spinal Brace: Thoracolumbosacral orthotic      Mobility Bed Mobility Overal bed mobility: Needs Assistance Bed Mobility: Rolling;Sidelying to Sit Rolling: Mod assist Sidelying to sit: Mod assist;HOB elevated       General bed mobility comments: needing mod A due to pain, difficulty pushing through UEs to elevate trunk     Transfers Overall transfer level: Needs assistance Equipment used: Rolling walker (2 wheeled) Transfers: Sit to/from [employer] Sit to Stand: Min assist;+2 physical assistance;+2 safety/equipment Stand pivot transfers: Min assist;+2 physical assistance;+2 safety/equipment       General transfer comment: see toilet transfer in ADL section    Balance Overall balance assessment: Needs assistance;History of Falls Sitting-balance support: Feet supported;Bilateral upper extremity supported Sitting balance-[name]: Poor Sitting balance - Comments: max support of UEs   Standing balance support: Bilateral upper extremity supported;During functional activity Standing balance-[name]: Poor Standing balance comment: reliant on UEs and external assist                           ADL either performed or assessed with clinical judgement   ADL Overall ADL's : Needs assistance/impaired     Grooming: Set up;Sitting   Upper Body Bathing: Minimal assistance;Sitting   Lower Body Bathing: Maximal assistance;Sitting/lateral leans;Sit to/from stand   Upper Body Dressing : Minimal assistance;Sitting   Lower Body Dressing: Total assistance;Sitting/lateral leans;Sit to/from stand Lower Body Dressing Details (indicate cue type and reason): due to acute compression fx and pain Toilet Transfer: Minimal assistance;+2 for physical assistance;+2 for safety/equipment;Cueing for safety;Cueing for sequencing;Stand-pivot;BSC;RW Toilet Transfer Details (indicate cue type and reason): patient first transfer to bedside commode then recliner. needs assistance to manage turning walker as she reports pain in back when pushing with her arms to try and turn walker herself. Toileting- Clothing Manipulation and Hygiene: Total assistance;Sit to/from stand Toileting - Clothing Manipulation Details (indicate cue type and reason): reliant on B UE support  Functional mobility during  ADLs: Minimal assistance;+2 for safety/equipment;+2 for physical assistance;Cueing for safety;Cueing for sequencing;Rolling walker General ADL Comments: patient below her baseline with self care due to pain limiting activity tolerance, balance and strength      Pertinent Vitals/Pain Pain Assessment: Faces Faces Pain Scale: Hurts even more Pain Location: low to mid back Pain Descriptors / Indicators: Discomfort;Grimacing;Guarding Pain Intervention(s): RN gave pain meds during session     Hand Dominance Right   Extremity/Trunk Assessment Upper Extremity Assessment Upper Extremity Assessment: Generalized weakness   Lower Extremity Assessment Lower Extremity Assessment: Defer to PT evaluation   Cervical / Trunk Assessment Cervical / Trunk Assessment: Kyphotic   Communication Communication Communication: HOH   Cognition Arousal/Alertness: Awake/alert Behavior During Therapy: WFL for tasks assessed/performed;Anxious Overall Cognitive Status: Within Functional Limits for tasks assessed                                                Home Living Family/patient expects to be discharged to:: Private residence Living Arrangements: Spouse/significant other Available Help at Discharge: Family;Available 24 hours/day Type of Home: House Home Access: Level entry     Home Layout: One level     Bathroom Shower/Tub: Runner, broadcasting/film/video: None          Prior Functioning/Environment Level of Independence: Independent        Comments: until  back issues        OT Problem List: Decreased strength;Decreased activity tolerance;Impaired balance (sitting and/or standing);Pain;Decreased safety awareness;Decreased knowledge of use of DME or AE      OT Treatment/Interventions: Self-care/ADL training;DME and/or AE instruction;Therapeutic activities;Patient/family education;Balance training    OT Goals(Current goals can be found in the care  plan section) Acute Rehab OT Goals Patient Stated Goal: less pain OT Goal Formulation: With patient Time For Goal Achievement: 02/21/21 Potential to Achieve Goals: Good  OT Frequency: Min 2X/week   Barriers to D/C:               AM-PAC OT "6 Clicks" Daily Activity     Outcome Measure Help from another person eating meals?: None Help from another person taking care of personal grooming?: A Little Help from another person toileting, which includes using toliet, bedpan, or urinal?: A Lot Help from another person bathing (including washing, rinsing, drying)?: A Lot Help from another person to put on and taking off regular upper body clothing?: A Little Help from another person to put on and taking off regular lower body clothing?: Total 6 Click Score: 15   End of Session Equipment Utilized During Treatment: Back brace;Rolling walker Nurse Communication: Mobility status  Activity Tolerance: Patient tolerated treatment well Patient left: in chair;with call bell/phone within reach;with chair alarm set  OT Visit Diagnosis: Unsteadiness on feet (R26.81);Other abnormalities of gait and mobility (R26.89);History of falling (Z91.81);Muscle weakness (generalized) (M62.81);Pain Pain - part of body:  (back)                Time: 1026-1050 OT Time Calculation (min): 24 min Charges:  OT General Charges $OT Visit: 1 Visit OT Evaluation $OT Eval Low Complexity: 1 Low OT Treatments $Self Care/Home Management : 8-22 mins  Marlyce Huge OT OT pager: [phone]  Carmelia Roller 02/07/2021, 2:58 PM

## 2021-02-07 NOTE — Progress Notes (Signed)
[facility]  PROGRESS NOTE  Natasha Harper BJY:[id number] DOB: 09/08/1934 DOA: 02/05/2021 PCP: [name], MD   Brief HPI:   86 year old female with history of carotid artery disease, hypothyroidism, hyperlipidemia presented with worsening lower back pain and generalized weakness. She was seen by [facility] in early July and reportedly had x-ray that were negative for fracture.  She was then told to do physical therapy at a pool which she cannot do due to pain.  Also given tramadol but states it just made her "simple minded" and constipation.  She then presented to orthopedic again on 7/25 and had MRI showing compression fracture at T10, T11, L1, L2 and L3.  Shortly after this MRI she had a fall at home.  States she was walking towards a chair but he had wheels and slipped out from under her causing her to land on her buttocks.  Did not hit her head or have loss of consciousness.  However since then she has been having worsening back pain. She was brought to the ED due to limited mobility and pain.   CT thoracic and lumbar spine showed no significant change in previous subacute fracture seen on MRI.  There is a new age-indeterminate T8 compression fracture with vertebral body height loss.  No high-grade spinal stenosis or large disc herniation.  ED provider discussed with neurosurgery, they recommended TLSO brace.  Subjective   Patient seen and examined, pain well controlled with Tylenol.  Husband at bedside.   Assessment/Plan:    Spinal compression fracture, T8, T10, T11, L1, L2, L3 -Nonoperable as per neurosurgery -Recommends TLSO brace and outpatient follow-up with 2 weeks -Patient wants to avoid opioids for pain -Continue Tylenol as needed, ibuprofen as needed, lidocaine patch -PT eval obtained, patient can go to skilled nursing facility for rehab  Left fifth proximal phalanx fracture -Seen on x-ray of the foot -Discussed with orthopedist Dr. Roda Shutters, he recommends putting  postop shoe for a month -No surgical intervention recommended   Hypothyroidism -Continue Synthroid  Hyperlipidemia -Continue statin  Cystic lesion in right pelvis -Incidental finding on MRI -Recommend follow-up ultrasound as outpatient  Multiple cystic lesions in right upper quadrant/pancreatic head -Incidental finding on MRI -LFTs are normal -Outpatient follow-up MRCP  Hypokalemia -replete  Hypertension -Blood pressure is elevated -We will start amlodipine 5 mg daily -Hydralazine 25 mg p.o. every 6 hours as needed for BP greater than 160/100.     Scheduled medications:    acetaminophen  650 mg Oral Q6H   aspirin EC  81 mg Oral Daily   atorvastatin  80 mg Oral Daily   cholecalciferol  1,000 Units Oral Daily   enoxaparin (LOVENOX) injection  40 mg Subcutaneous Q24H   levothyroxine  50 mcg Oral Q0600   lidocaine  1 patch Transdermal Q24H   omega-3 acid ethyl esters  1 g Oral Daily   vitamin C  125 mg Oral Daily         Data Reviewed:   CBG:  No results for input(s): GLUCAP in the last 168 hours.  SpO2: 98 %    Vitals:   02/06/21 2101 02/07/21 0144 02/07/21 0615 02/07/21 1002  BP: (!) 181/72 (!) 178/77 (!) 186/77 (!) 186/79  Pulse: 84 84 80 83  Resp: Temp: 98.5 F (36.9 C) 98.5 F (36.9 C) 98.5 F (36.9 C) 98.9 F (37.2 C)  TempSrc: Oral Oral  Oral  SpO2: 97% 96% 96% 98%     Intake/Output Summary (Last  24 hours) at 02/07/2021 1314 Last data filed at 02/07/2021 1000 Gross per 24 hour  Intake 490 ml  Output 500 ml  Net -10 ml    08/02 1901 - 08/04 0700 In: 250 [P.O.:250] Out: 150 [Urine:150]  There were no vitals filed for this visit.  CBC:  Recent Labs  Lab 02/05/21 1824 02/06/21 0500 02/07/21 0437  WBC 3.6* 3.7* 4.3  HGB 11.8* 10.2* 11.6*  HCT 35.5* 31.0* 34.5*  PLT 259 236 263  MCV 103.8* 106.5* 105.5*  MCH 34.5* 35.1* 35.5*  MCHC 33.2 32.9 33.6  RDW 14.4 14.6 14.5  LYMPHSABS 0.6*  --   --   MONOABS 0.3  --    --   EOSABS 0.0  --   --   BASOSABS 0.0  --   --     Complete metabolic panel:  Recent Labs  Lab 02/05/21 1824 02/07/21 0437  NA 135 135  K 3.2* 4.6  CL 98 102  CO2 25 25  GLUCOSE 95 81  BUN 11 23  CREATININE 0.45 0.48  CALCIUM 9.1 8.9  AST 26  --   ALT 27  --   ALKPHOS 120  --   BILITOT 0.7  --   ALBUMIN 3.7  --     No results for input(s): LIPASE, AMYLASE in the last 168 hours.  Recent Labs  Lab 02/05/21 2030  SARSCOV2NAA NEGATIVE    ------------------------------------------------------------------------------------------------------------------ No results for input(s): CHOL, HDL, LDLCALC, TRIG, CHOLHDL, LDLDIRECT in the last 72 hours.  Lab Results  Component Value Date   HGBA1C 5.6 11/25/2017   ------------------------------------------------------------------------------------------------------------------ No results for input(s): TSH, T4TOTAL, T3FREE, THYROIDAB in the last 72 hours.  Invalid input(s): FREET3 ------------------------------------------------------------------------------------------------------------------ No results for input(s): VITAMINB12, FOLATE, FERRITIN, TIBC, IRON, RETICCTPCT in the last 72 hours.  Coagulation profile No results for input(s): INR, PROTIME in the last 168 hours. No results for input(s): DDIMER in the last 72 hours.  Cardiac Enzymes No results for input(s): CKTOTAL, CKMB, CKMBINDEX, TROPONINI in the last 168 hours.  ------------------------------------------------------------------------------------------------------------------ No results found for: BNP   Antibiotics: Anti-infectives (From admission, onward)    None        Radiology Reports  DG Pelvis 1-2 Views  Result Date: 02/05/2021 CLINICAL DATA:  Fall. EXAM: PELVIS - 1-2 VIEW COMPARISON:  None. FINDINGS: Overlying artifact obscures assessment of the left sacrum and sacroiliac joints. No evidence of acute pelvic fracture. Both femoral heads are  seated. Pubic rami appear intact. Pubic symphysis is congruent. The bones are diffusely under mineralized. IMPRESSION: No evidence of pelvic fracture. Overlying artifact obscures assessment of the left sacrum and sacroiliac joints. Electronically Signed   By: Narda Rutherford M.D.   On: 02/05/2021 21:16   CT Thoracic Spine Wo Contrast  Result Date: 02/05/2021 CLINICAL DATA:  Persistent back pain and immobility. Patient fell again 5 days ago. Spine fracture, thoracic, traumatic. EXAM: CT THORACIC AND LUMBAR SPINE WITHOUT CONTRAST TECHNIQUE: Multidetector CT imaging of the thoracic and lumbar spine was performed without contrast. Multiplanar CT image reconstructions were also generated. COMPARISON:  MRI lumbar spine 01/28/2021. No other comparison studies. FINDINGS: CT THORACIC SPINE FINDINGS Alignment: Mild convex right scoliosis. The sagittal alignment is normal. Vertebrae: The bones are diffusely demineralized. As demonstrated on recent MRI, there are compression fractures at T10 (approximately 60% loss of vertebral body height) and T11 (approximately 80% loss of vertebral body height) which appear unchanged. Both of these fractures are associated with mild osseous retropulsion, and demonstrated marrow edema on MRI  consistent with recent or unhealed fractures. In addition, there is a superior endplate compression fracture at T8 which was not previously imaged. This results in approximately 75% loss of vertebral body height. There is a mild superior endplate compression fracture at T5, resulting in approximately 15% loss of vertebral body height. No other vertebral body fractures or focal osseous lesions. Suspected old fractures of the 8th ribs bilaterally and right 9th rib near the costovertebral junctions. Paraspinal and other soft tissues: Mild paraspinous edema at T10 and T11 supporting recent fractures at these levels. No significant paraspinal hematoma. Diffuse atherosclerosis of the aorta, great vessels  and coronary arteries. Small bilateral pleural effusions with bibasilar atelectasis and underlying mild emphysema. Disc levels: No large disc herniation or high-grade spinal stenosis is identified. There is no significant acquired disc pathology at or above the T7-8 level. There is vacuum phenomenon within the T8-9 and T9-10 discs. As above, there is mild osseous retropulsion at T10 and T11 without resulting significant spinal stenosis. CT LUMBAR SPINE FINDINGS Segmentation: There are 5 lumbar type vertebral bodies. Alignment: Degenerative grade 1 anterolisthesis at L4-5. Vertebrae: Chronic compression fracture at L1 resulting in 50% loss of vertebral body height and mild osseous retropulsion is unchanged from the MRI. There are minimal superior endplate compression fractures at L2 and L3 which are also unchanged. The L3 fracture is associated with minimal osseous retropulsion. Both of these fractures were associated with mild superior endplate edema on MRI. No new fractures. Paraspinal and other soft tissues: No acute paraspinal findings. Extensive aortic and branch vessel atherosclerosis. The bladder appears mildly distended. Disc levels: The disc space findings are unchanged from the recent MRI. At L4-5, there is a grade 1 anterolisthesis secondary to bilateral facet hypertrophy, and this contributes to mild spinal stenosis, narrowing of the lateral recesses and right foramen. IMPRESSION: 1. The subacute fractures at T10, T11, L2 and L3 demonstrated on MRI 1 week ago have not significantly changed in the interval. No progressive loss of vertebral body height or progressive osseous retropulsion. 2. In addition, there is an age-indeterminate T8 compression deformity resulting in approximately 75% loss of vertebral body height, not previously imaged. Mild T5 compression deformity, not previously imaged. 3. Stable healed chronic L1 compression fracture. 4. Mild multilevel spondylosis without high-grade spinal  stenosis or large disc herniation. Stable degenerative anterolisthesis at L4-5. 5. Extensive Aortic Atherosclerosis (ICD10-I70.0). Electronically Signed   By: Carey Bullocks M.D.   On: 02/05/2021 16:33   CT Lumbar Spine Wo Contrast  Result Date: 02/05/2021 CLINICAL DATA:  Persistent back pain and immobility. Patient fell again 5 days ago. Spine fracture, thoracic, traumatic. EXAM: CT THORACIC AND LUMBAR SPINE WITHOUT CONTRAST TECHNIQUE: Multidetector CT imaging of the thoracic and lumbar spine was performed without contrast. Multiplanar CT image reconstructions were also generated. COMPARISON:  MRI lumbar spine 01/28/2021. No other comparison studies. FINDINGS: CT THORACIC SPINE FINDINGS Alignment: Mild convex right scoliosis. The sagittal alignment is normal. Vertebrae: The bones are diffusely demineralized. As demonstrated on recent MRI, there are compression fractures at T10 (approximately 60% loss of vertebral body height) and T11 (approximately 80% loss of vertebral body height) which appear unchanged. Both of these fractures are associated with mild osseous retropulsion, and demonstrated marrow edema on MRI consistent with recent or unhealed fractures. In addition, there is a superior endplate compression fracture at T8 which was not previously imaged. This results in approximately 75% loss of vertebral body height. There is a mild superior endplate compression fracture at T5, resulting in  approximately 15% loss of vertebral body height. No other vertebral body fractures or focal osseous lesions. Suspected old fractures of the 8th ribs bilaterally and right 9th rib near the costovertebral junctions. Paraspinal and other soft tissues: Mild paraspinous edema at T10 and T11 supporting recent fractures at these levels. No significant paraspinal hematoma. Diffuse atherosclerosis of the aorta, great vessels and coronary arteries. Small bilateral pleural effusions with bibasilar atelectasis and underlying mild  emphysema. Disc levels: No large disc herniation or high-grade spinal stenosis is identified. There is no significant acquired disc pathology at or above the T7-8 level. There is vacuum phenomenon within the T8-9 and T9-10 discs. As above, there is mild osseous retropulsion at T10 and T11 without resulting significant spinal stenosis. CT LUMBAR SPINE FINDINGS Segmentation: There are 5 lumbar type vertebral bodies. Alignment: Degenerative grade 1 anterolisthesis at L4-5. Vertebrae: Chronic compression fracture at L1 resulting in 50% loss of vertebral body height and mild osseous retropulsion is unchanged from the MRI. There are minimal superior endplate compression fractures at L2 and L3 which are also unchanged. The L3 fracture is associated with minimal osseous retropulsion. Both of these fractures were associated with mild superior endplate edema on MRI. No new fractures. Paraspinal and other soft tissues: No acute paraspinal findings. Extensive aortic and branch vessel atherosclerosis. The bladder appears mildly distended. Disc levels: The disc space findings are unchanged from the recent MRI. At L4-5, there is a grade 1 anterolisthesis secondary to bilateral facet hypertrophy, and this contributes to mild spinal stenosis, narrowing of the lateral recesses and right foramen. IMPRESSION: 1. The subacute fractures at T10, T11, L2 and L3 demonstrated on MRI 1 week ago have not significantly changed in the interval. No progressive loss of vertebral body height or progressive osseous retropulsion. 2. In addition, there is an age-indeterminate T8 compression deformity resulting in approximately 75% loss of vertebral body height, not previously imaged. Mild T5 compression deformity, not previously imaged. 3. Stable healed chronic L1 compression fracture. 4. Mild multilevel spondylosis without high-grade spinal stenosis or large disc herniation. Stable degenerative anterolisthesis at L4-5. 5. Extensive Aortic  Atherosclerosis (ICD10-I70.0). Electronically Signed   By: Carey Bullocks M.D.   On: 02/05/2021 16:33   DG Foot 2 Views Right  Result Date: 02/05/2021 CLINICAL DATA:  Fall.  Pain. EXAM: RIGHT FOOT - 2 VIEW COMPARISON:  None. FINDINGS: AP and lateral views. Osteopenia. Vascular calcifications. Minimally displaced, transverse fracture involving the proximal portion of the proximal phalanx of the fifth digit. No definite intra-articular extension. IMPRESSION: 1. Proximal fifth phalanx fracture. 2. Osteopenia. Electronically Signed   By: Jeronimo Greaves M.D.   On: 02/05/2021 21:15      DVT prophylaxis: Lovenox  Code Status: Full code  Family Communication: Discussed with husband at bedside   Consultants:   Procedures:     Objective    Physical Examination:  General-appears in no acute distress Heart-S1-S2, regular, no murmur auscultated Lungs-clear to auscultation bilaterally, no wheezing or crackles auscultated Abdomen-soft, nontender, no organomegaly Extremities-no edema in the lower extremities Neuro-alert, oriented x3, no focal deficit noted   Status is: Inpatient  Dispo: The patient is from: Home              Anticipated d/c is to: Skilled nursing facility              Anticipated d/c date is: 02/08/2021              Patient currently not stable for discharge  Barrier to discharge-back  pain  COVID-19 Labs  No results for input(s): DDIMER, FERRITIN, LDH, CRP in the last 72 hours.  Lab Results  Component Value Date   SARSCOV2NAA NEGATIVE 02/05/2021    Microbiology  Recent Results (from the past 240 hour(s))  Resp Panel by RT-PCR (Flu A&B, Covid) Nasopharyngeal Swab     Status: None   Collection Time: 02/05/21  8:30 PM   Specimen: Nasopharyngeal Swab; Nasopharyngeal(NP) swabs in vial transport medium  Result Value Ref Range Status   SARS Coronavirus 2 by RT PCR NEGATIVE NEGATIVE Final    Comment: (NOTE) SARS-CoV-2 target nucleic acids are NOT DETECTED.  The  SARS-CoV-2 RNA is generally detectable in upper respiratory specimens during the acute phase of infection. The lowest concentration of SARS-CoV-2 viral copies this assay can detect is 138 copies/mL. A negative result does not preclude SARS-Cov-2 infection and should not be used as the sole basis for treatment or other patient management decisions. A negative result may occur with  improper specimen collection/handling, submission of specimen other than nasopharyngeal swab, presence of viral mutation(s) within the areas targeted by this assay, and inadequate number of viral copies(<138 copies/mL). A negative result must be combined with clinical observations, patient history, and epidemiological information. The expected result is Negative.  Fact Sheet for Patients:  BloggerCourse.comhttps://www.fda.gov/media/152166/download  Fact Sheet for Healthcare Providers:  SeriousBroker.ithttps://www.fda.gov/media/152162/download  This test is no t yet approved or cleared by the [place] FDA and  has been authorized for detection and/or diagnosis of SARS-CoV-2 by FDA under an Emergency Use Authorization (EUA). This EUA will remain  in effect (meaning this test can be used) for the duration of the COVID-19 declaration under Section 564(b)(1) of the Act, 21 U.S.C.section 360bbb-3(b)(1), unless the authorization is terminated  or revoked sooner.    specimens during the acute phase of infection. The lowest concentration of SARS-CoV-2 viral copies this assay can detect is 138 copies/mL. A negative result does not preclude SARS-Cov-2 infection and should not be used as the sole basis for treatment or other patient management decisions. A negative result may occur with  improper specimen collection/handling, submission of specimen other than nasopharyngeal swab, presence of viral mutation(s) within the areas targeted by this assay, and inadequate number of viral copies(<138 copies/mL). A negative result must be c ombined  with clinical observations, patient history, and epidemiological information. The expected result is Negative.  Fact Sheet for Patients:  BloggerCourse.comhttps://www.fda.gov/media/152166/download  Fact Sheet for Healthcare Providers:  SeriousBroker.ithttps://www.fda.gov/media/152162/download  This test is not yet approved or cleared by the [place] FDA and  has been authorized for detection and/or diagnosis of SARS-CoV-2 by FDA under an Emergency Use Authorization (EUA). This EUA will remain  in effect (meaning this test can be used) for the duration of the COVID-19 declaration under Section 564(b)(1) of the Act, 21 U.S.C.section 360bbb-3(b)(1), unless the authorization is terminated  or revoked sooner.       Influenza A by PCR NEGATIVE NEGATIVE Final   Influenza B by PCR NEGATIVE NEGATIVE Final    Comment: (NOTE) The Xpert Xpress SARS-CoV-2/FLU/RSV plus assay is intended as an aid in the diagnosis of influenza from Nasopharyngeal swab specimens and should not be used as a sole basis for treatment. Nasal washings and aspirates are unacceptable for Xpert Xpress SARS-CoV-2/FLU/RSV testing.  Fact Sheet for Patients: BloggerCourse.comhttps://www.fda.gov/media/152166/download  Fact Sheet for Healthcare Providers: SeriousBroker.ithttps://www.fda.gov/media/152162/download  This test is not yet approved or cleared by the [place] FDA and has been authorized for detection and/or diagnosis of SARS-CoV-2 by  FDA under an Emergency Use Authorization (EUA). This EUA will remain in effect (meaning this test can be used) for the duration of the COVID-19 declaration under Section 564(b)(1) of the Act, 21 U.S.C. section 360bbb-3(b)(1), unless the authorization is terminated or revoked.  Performed at [facility], [address] [place], [place] [address]          Meredeth Ide   [facility] If 7PM-7AM, please contact night-coverage at [email or web address], Office  [phone]   02/07/2021, 1:14 PM  LOS: 0 days

## 2021-02-07 NOTE — Progress Notes (Signed)
Orthopedic Tech Progress Note Patient Details:  Natasha Harper 09-02-1934 [id number]  Patient ID: Natasha Harper, female   DOB: 05-13-1935, 86 y.o.   MRN: [id number]  Natasha Harper 02/07/2021, 2:07 PM Right post op shoe applied

## 2021-02-07 NOTE — TOC Initial Note (Signed)
Transition of Care [facility]) - Initial/Assessment Note    Patient Details  Name: Natasha Harper MRN: [id number] Date of Birth: 25-May-1935  [facility] of Care [facility]) CM/SW Contact:    Lennart Pall, LCSW Phone Number: 02/07/2021, 3:05 PM  Clinical Narrative:                 Met with pt and spouse this afternoon to introduce self/ TOC role.  Both very pleasant and are aware that therapies have recommended SNF for rehab prior to return home with spouse.  They are agreeable with this plan. Will begin SNF bed search and insurance authorization.  Expected Discharge Plan: Skilled Nursing Facility Barriers to Discharge: Continued Medical Work up, Ship broker   Patient Goals and CMS Choice Patient states their goals for this hospitalization and ongoing recovery are:: to return home following SNF rehab; decrease in overall pain CMS Medicare.gov Compare Post Acute Care list provided to:: Patient Choice offered to / list presented to : Patient, Spouse  Expected Discharge Plan and Services Expected Discharge Plan: [place] In-house Referral: Clinical Social Work   Post Acute Care Choice: [place] Living arrangements for the past 2 months: [place]                 DME Arranged: N/A DME Agency: NA                  Prior Living Arrangements/Services Living arrangements for the past 2 months: Single Family Home Lives with:: Spouse Patient language and need for interpreter reviewed:: Yes Do you feel safe going back to the place where you live?: Yes      Need for Family Participation in Patient Care: Yes (Comment) Care giver support system in place?: Yes (comment)   Criminal Activity/Legal Involvement Pertinent to Current Situation/Hospitalization: No - Comment as needed  Activities of Daily Living Home Assistive Devices/Equipment: Contact lenses, Walker (specify type), Raised toilet seat with rails (2 contact lens) ADL Screening  (condition at time of admission) Patient's cognitive ability adequate to safely complete daily activities?: Yes Is the patient deaf or have difficulty hearing?: Yes Does the patient have difficulty seeing, even when wearing glasses/contacts?: No (wears 2 contact lens) Does the patient have difficulty concentrating, remembering, or making decisions?: No Patient able to express need for assistance with ADLs?: Yes Does the patient have difficulty dressing or bathing?: Yes Independently performs ADLs?: No Communication: Independent Dressing (OT): Needs assistance Is this a change from baseline?: Change from baseline, expected to last >3 days Grooming: Independent Feeding: Independent Bathing: Needs assistance Is this a change from baseline?: Change from baseline, expected to last >3 days Toileting: Needs assistance Is this a change from baseline?: Change from baseline, expected to last >3days In/Out Bed: Needs assistance Is this a change from baseline?: Change from baseline, expected to last >3 days Walks in Home: Needs assistance Is this a change from baseline?: Change from baseline, expected to last >3 days Does the patient have difficulty walking or climbing stairs?: Yes Weakness of Legs: None (feels weak at waist on both sides) Weakness of Arms/Hands: None  Permission Sought/Granted Permission sought to share information with : Family Supports Permission granted to share information with : Yes, Verbal Permission Granted  Share Information with NAME: Adalyne Lovick     Permission granted to share info w Relationship: spouse  Permission granted to share info w Contact Information: [phone]  Emotional Assessment Appearance:: Appears stated age Attitude/Demeanor/Rapport: Engaged, Gracious Affect (typically observed):  Accepting Orientation: : Oriented to Self, Oriented to Place, Oriented to  Time, Oriented to Situation Alcohol / Substance Use: Not Applicable Psych Involvement: No  (comment)  Admission diagnosis:  Back pain [M54.9] Trauma [T14.90XA] Fall [W19.XXXA] Patient Active Problem List   Diagnosis Date Noted   Compression fracture of thoracic vertebra ([place]) 02/06/2021   Compression fracture of lumbar vertebra ([place]) 02/06/2021   Back pain 02/05/2021   Medicare annual wellness visit, subsequent 12/20/2014   Decreased hearing 10/05/2013   Dyslipidemia    Carotid arterial disease ([place])    Hypothyroidism    Ejection fraction    Visit for preventive health examination 06/02/2011   Medication side effect 06/02/2011   Disorder of bone and cartilage 04/07/2008   HYPERTENSION, WHITE COAT 04/07/2008   HYPERGLYCEMIA, FASTING 07/21/2007   THYROID NODULE 04/05/2007   PCP:  Burnis Medin, MD Pharmacy:   [place], [place] - [address] [place] [address] Phone: [phone] Fax: [phone]     Social Determinants of Health (SDOH) Interventions    Readmission Risk Interventions No flowsheet data found.

## 2021-02-08 DIAGNOSIS — I10 Essential (primary) hypertension: Secondary | ICD-10-CM | POA: Diagnosis not present

## 2021-02-08 DIAGNOSIS — S32000D Wedge compression fracture of unspecified lumbar vertebra, subsequent encounter for fracture with routine healing: Secondary | ICD-10-CM | POA: Diagnosis not present

## 2021-02-08 DIAGNOSIS — S22060A Wedge compression fracture of T7-T8 vertebra, initial encounter for closed fracture: Secondary | ICD-10-CM | POA: Diagnosis not present

## 2021-02-08 DIAGNOSIS — E039 Hypothyroidism, unspecified: Secondary | ICD-10-CM | POA: Diagnosis not present

## 2021-02-08 DIAGNOSIS — S22080A Wedge compression fracture of T11-T12 vertebra, initial encounter for closed fracture: Secondary | ICD-10-CM | POA: Diagnosis not present

## 2021-02-08 NOTE — TOC Progression Note (Signed)
Transition of Care [facility]) - Progression Note    Patient Details  Name: Natasha Harper MRN: [id number] Date of Birth: 12/31/1934  Transition of Care [facility]) CM/SW Contact  Lennart Pall, LCSW Phone Number: 02/08/2021, 4:59 PM  Clinical Narrative:    Met with pt and spouse this afternoon to review SNF bed offers and they have accepted bed at [facility].   Have begun insurance authorization (Ref# [id number]).  Facility may not be able to admit until Monday and pending auth approval.  Will alert oncoming TOC weekend coverage.   Expected Discharge Plan: [place] Barriers to Discharge: Continued Medical Work up, Ship broker  Expected Discharge Plan and Services Expected Discharge Plan: [place] In-house Referral: Clinical Social Work   Post Acute Care Choice: [place] Living arrangements for the past 2 months: Single Family Home                 DME Arranged: N/A DME Agency: NA                   Social Determinants of Health (SDOH) Interventions    Readmission Risk Interventions No flowsheet data found.

## 2021-02-08 NOTE — Progress Notes (Signed)
[facility]  PROGRESS NOTE  Natasha Harper:[id number] DOB: [birth year] DOA: 02/05/2021 PCP: [name], MD   Brief HPI:   86 year old female with history of carotid artery disease, hypothyroidism, hyperlipidemia presented with worsening lower back pain and generalized weakness. She was seen by [facility] in early July and reportedly had x-ray that were negative for fracture.  She was then told to do physical therapy at a pool which she cannot do due to pain.  Also given tramadol but states it just made her "simple minded" and constipation.  She then presented to orthopedic again on 7/25 and had MRI showing compression fracture at T10, T11, L1, L2 and L3.  Shortly after this MRI she had a fall at home.  States she was walking towards a chair but he had wheels and slipped out from under her causing her to land on her buttocks.  Did not hit her head or have loss of consciousness.  However since then she has been having worsening back pain. She was brought to the ED due to limited mobility and pain.   CT thoracic and lumbar spine showed no significant change in previous subacute fracture seen on MRI.  There is a new age-indeterminate T8 compression fracture with vertebral body height loss.  No high-grade spinal stenosis or large disc herniation.  ED provider discussed with neurosurgery, they recommended TLSO brace.  Subjective   Patient seen and examined, awaiting to go to skilled nursing facility.   Assessment/Plan:    Spinal compression fracture, T8, T10, T11, L1, L2, L3 -Nonoperable as per neurosurgery -Recommends TLSO brace and outpatient follow-up with 2 weeks -Patient wants to avoid opioids for pain -Continue Tylenol as needed, ibuprofen as needed, lidocaine patch -PT eval obtained, patient can go to skilled nursing facility for rehab  Left fifth proximal phalanx fracture -Seen on x-ray of the foot -Discussed with orthopedist Dr. Roda Shutters, he recommends putting postop shoe  for a month -No surgical intervention recommended   Hypothyroidism -Continue Synthroid  Hyperlipidemia -Continue statin  Cystic lesion in right pelvis -Incidental finding on MRI -Recommend follow-up ultrasound as outpatient  Multiple cystic lesions in right upper quadrant/pancreatic head -Incidental finding on MRI -LFTs are normal -Outpatient follow-up MRCP  Hypokalemia -replete  Hypertension -Blood pressure is elevated -We will start amlodipine 5 mg daily -Hydralazine 25 mg p.o. every 6 hours as needed for BP greater than 160/100.     Scheduled medications:    acetaminophen  650 mg Oral Q6H   amLODipine  5 mg Oral Daily   aspirin EC  81 mg Oral Daily   atorvastatin  80 mg Oral Daily   cholecalciferol  1,000 Units Oral Daily   enoxaparin (LOVENOX) injection  40 mg Subcutaneous Q24H   levothyroxine  50 mcg Oral Q0600   lidocaine  1 patch Transdermal Q24H   omega-3 acid ethyl esters  1 g Oral Daily   vitamin C  125 mg Oral Daily         Data Reviewed:   CBG:  No results for input(s): GLUCAP in the last 168 hours.  SpO2: 98 %    Vitals:   02/07/21 1844 02/07/21 2103 02/08/21 0430 02/08/21 1315  BP: (!) 160/73 (!) 169/78 (!) 173/91 (!) 158/75  Pulse: 100 95 94 100  Resp: 16 14 16 16   Temp: 98.2 F (36.8 C) 97.6 F (36.4 C) 98.3 F (36.8 C) (!) 97.4 F (36.3 C)  TempSrc: Oral Oral Oral Oral  SpO2: 96% 97% 96% 98%  Weight:   52.2 kg   Height:   5\' 1"  (1.549 m)      Intake/Output Summary (Last 24 hours) at 02/08/2021 1528 Last data filed at 02/08/2021 1400 Gross per 24 hour  Intake 600 ml  Output 100 ml  Net 500 ml    08/03 1901 - 08/05 0700 In: 730 [P.O.:730] Out: 500 [Urine:500]  Filed Weights   02/08/21 0430  Weight: 52.2 kg    CBC:  Recent Labs  Lab 02/05/21 1824 02/06/21 0500 02/07/21 0437  WBC 3.6* 3.7* 4.3  HGB 11.8* 10.2* 11.6*  HCT 35.5* 31.0* 34.5*  PLT 259 236 263  MCV 103.8* 106.5* 105.5*  MCH 34.5* 35.1* 35.5*   MCHC 33.2 32.9 33.6  RDW 14.4 14.6 14.5  LYMPHSABS 0.6*  --   --   MONOABS 0.3  --   --   EOSABS 0.0  --   --   BASOSABS 0.0  --   --     Complete metabolic panel:  Recent Labs  Lab 02/05/21 1824 02/07/21 0437  NA 135 135  K 3.2* 4.6  CL 98 102  CO2 25 25  GLUCOSE 95 81  BUN 11 23  CREATININE 0.45 0.48  CALCIUM 9.1 8.9  AST 26  --   ALT 27  --   ALKPHOS 120  --   BILITOT 0.7  --   ALBUMIN 3.7  --     No results for input(s): LIPASE, AMYLASE in the last 168 hours.  Recent Labs  Lab 02/05/21 2030  SARSCOV2NAA NEGATIVE    ------------------------------------------------------------------------------------------------------------------ No results for input(s): CHOL, HDL, LDLCALC, TRIG, CHOLHDL, LDLDIRECT in the last 72 hours.  Lab Results  Component Value Date   HGBA1C 5.6 11/25/2017   ------------------------------------------------------------------------------------------------------------------ No results for input(s): TSH, T4TOTAL, T3FREE, THYROIDAB in the last 72 hours.  Invalid input(s): FREET3 ------------------------------------------------------------------------------------------------------------------ No results for input(s): VITAMINB12, FOLATE, FERRITIN, TIBC, IRON, RETICCTPCT in the last 72 hours.  Coagulation profile No results for input(s): INR, PROTIME in the last 168 hours. No results for input(s): DDIMER in the last 72 hours.  Cardiac Enzymes No results for input(s): CKTOTAL, CKMB, CKMBINDEX, TROPONINI in the last 168 hours.  ------------------------------------------------------------------------------------------------------------------ No results found for: BNP   Antibiotics: Anti-infectives (From admission, onward)    None        Radiology Reports  No results found.    DVT prophylaxis: Lovenox  Code Status: Full code  Family Communication: Discussed with husband at  bedside   Consultants:   Procedures:     Objective    Physical Examination:  General-appears in no acute distress Heart-S1-S2, regular, no murmur auscultated Lungs-clear to auscultation bilaterally, no wheezing or crackles auscultated Abdomen-soft, nontender, no organomegaly Extremities-no edema in the lower extremities Neuro-alert, oriented x3, no focal deficit noted   Status is: Inpatient  Dispo: The patient is from: Home              Anticipated d/c is to: Skilled nursing facility              Anticipated d/c date is: 02/08/2021              Patient currently not stable for discharge  Barrier to discharge-back pain  COVID-19 Labs  No results for input(s): DDIMER, FERRITIN, LDH, CRP in the last 72 hours.  Lab Results  Component Value Date   SARSCOV2NAA NEGATIVE 02/05/2021    Microbiology  Recent Results (from the past 240 hour(s))  Resp Panel by RT-PCR (Flu A&B,  Covid) Nasopharyngeal Swab     Status: None   Collection Time: 02/05/21  8:30 PM   Specimen: Nasopharyngeal Swab; Nasopharyngeal(NP) swabs in vial transport medium  Result Value Ref Range Status   SARS Coronavirus 2 by RT PCR NEGATIVE NEGATIVE Final    Comment: (NOTE) SARS-CoV-2 target nucleic acids are NOT DETECTED.  The SARS-CoV-2 RNA is generally detectable in upper respiratory specimens during the acute phase of infection. The lowest concentration of SARS-CoV-2 viral copies this assay can detect is 138 copies/mL. A negative result does not preclude SARS-Cov-2 infection and should not be used as the sole basis for treatment or other patient management decisions. A negative result may occur with  improper specimen collection/handling, submission of specimen other than nasopharyngeal swab, presence of viral mutation(s) within the areas targeted by this assay, and inadequate number of viral copies(<138 copies/mL). A negative result must be combined with clinical observations, patient history, and  epidemiological information. The expected result is Negative.  Fact Sheet for Patients:  BloggerCourse.comhttps://www.fda.gov/media/152166/download  Fact Sheet for Healthcare Providers:  SeriousBroker.ithttps://www.fda.gov/media/152162/download  This test is no t yet approved or cleared by the [place] FDA and  has been authorized for detection and/or diagnosis of SARS-CoV-2 by FDA under an Emergency Use Authorization (EUA). This EUA will remain  in effect (meaning this test can be used) for the duration of the COVID-19 declaration under Section 564(b)(1) of the Act, 21 U.S.C.section 360bbb-3(b)(1), unless the authorization is terminated  or revoked sooner.    specimens during the acute phase of infection. The lowest concentration of SARS-CoV-2 viral copies this assay can detect is 138 copies/mL. A negative result does not preclude SARS-Cov-2 infection and should not be used as the sole basis for treatment or other patient management decisions. A negative result may occur with  improper specimen collection/handling, submission of specimen other than nasopharyngeal swab, presence of viral mutation(s) within the areas targeted by this assay, and inadequate number of viral copies(<138 copies/mL). A negative result must be c ombined with clinical observations, patient history, and epidemiological information. The expected result is Negative.  Fact Sheet for Patients:  BloggerCourse.comhttps://www.fda.gov/media/152166/download  Fact Sheet for Healthcare Providers:  SeriousBroker.ithttps://www.fda.gov/media/152162/download  This test is not yet approved or cleared by the [place] FDA and  has been authorized for detection and/or diagnosis of SARS-CoV-2 by FDA under an Emergency Use Authorization (EUA). This EUA will remain  in effect (meaning this test can be used) for the duration of the COVID-19 declaration under Section 564(b)(1) of the Act, 21 U.S.C.section 360bbb-3(b)(1), unless the authorization is terminated  or revoked sooner.        Influenza A by PCR NEGATIVE NEGATIVE Final   Influenza B by PCR NEGATIVE NEGATIVE Final    Comment: (NOTE) The Xpert Xpress SARS-CoV-2/FLU/RSV plus assay is intended as an aid in the diagnosis of influenza from Nasopharyngeal swab specimens and should not be used as a sole basis for treatment. Nasal washings and aspirates are unacceptable for Xpert Xpress SARS-CoV-2/FLU/RSV testing.  Fact Sheet for Patients: BloggerCourse.comhttps://www.fda.gov/media/152166/download  Fact Sheet for Healthcare Providers: SeriousBroker.ithttps://www.fda.gov/media/152162/download  This test is not yet approved or cleared by the [place] FDA and has been authorized for detection and/or diagnosis of SARS-CoV-2 by FDA under an Emergency Use Authorization (EUA). This EUA will remain in effect (meaning this test can be used) for the duration of the COVID-19 declaration under Section 564(b)(1) of the Act, 21 U.S.C. section 360bbb-3(b)(1), unless the authorization is terminated or revoked.  Performed at [facility], [address] [place], [place] [address]          Meredeth Ide   [facility] If 7PM-7AM, please contact night-coverage at [email or web address], Office  [phone]   02/08/2021, 3:28 PM  LOS: 0 days

## 2021-02-08 NOTE — Progress Notes (Signed)
Physical Therapy Treatment Patient Details Name: Natasha Harper MRN: [id number] DOB: 19-May-1935 Today's Date: 02/08/2021    History of Present Illness Natasha MALLIE GIAMBRA is a 86 y.o. female with medical history significant for carotid artery disease, hypothyroidism, hyperlipidemia who presents with concerns of worsening lower back pain and weakness.Followed by [facility]- MRI showing compression fracture at T10, T11, L1, L2 and L3.In ED CT thoracic and lumbar spine showed no significant change in previous subacute fracture seen on MRI.  There is a new age-indeterminate T8 compression fracture with vertebral body height loss., NS recommended TLSO, Left 5th proximal phalanx fracture( No WB orders)    PT Comments    The patient found seated on bed edge, spouse present. Patient has a [place] TLSO in place that does not appear to fit. Assisted placing the TLSO provided in  ED . The  brace had been adjusted and the sternal piece was not pressing on neck. Patient  and spouse instructed in  donning and tightening the brace. Patient reports the brace does feel supportive and is comfortable.  Patient  stands with extra effort at [facility]. Patient ambulated x 90' then 20' using RW. Patient does not indicate that she has  pain. Patient will benefit from post acute rehab to improve functional mobility and safety.  Follow Up Recommendations  Supervision/Assistance - 24 hour;SNF     Equipment Recommendations  None recommended by PT    Recommendations for Other Services       Precautions / Restrictions Precautions Precautions: Fall Required Braces or Orthoses: Spinal Brace;Splint/Cast Spinal Brace: Thoracolumbosacral orthotic;Applied in sitting position Splint/Cast: post op shoe on rt. Restrictions Weight Bearing Restrictions: No    Mobility  Bed Mobility               General bed mobility comments: seated on side of bed    Transfers Overall transfer level: Needs assistance Equipment used:  Rolling walker (2 wheeled) Transfers: Sit to/from Stand Sit to Stand: Min assist         General transfer comment: min assist from bed and toilet  Ambulation/Gait Ambulation/Gait assistance: Min assist Gait Distance (Feet): -90 Feet (then 20) Assistive device: Rolling walker (2 wheeled) Gait Pattern/deviations: Step-through pattern Gait velocity: decr   General Gait Details: slow speed, assist to turn   at tight corners   Stairs             Wheelchair Mobility    Modified Rankin (Stroke Patients Only)       Balance Overall balance assessment: Needs assistance;History of Falls Sitting-balance support: Feet supported;No upper extremity supported Sitting balance-[name]: Good     Standing balance support: Bilateral upper extremity supported Standing balance-[name]: Fair Standing balance comment: reliant on UEs                            Cognition Arousal/Alertness: Awake/alert Behavior During Therapy: WFL for tasks assessed/performed;Anxious                                          Exercises      General Comments        Pertinent Vitals/Pain Pain Assessment: No/denies pain    Home Living                      Prior Function  PT Goals (current goals can now be found in the care plan section) Progress towards PT goals: Progressing toward goals    Frequency    Min 3X/week      PT Plan Current plan remains appropriate    Co-evaluation              AM-PAC PT "6 Clicks" Mobility   Outcome Measure  Help needed turning from your back to your side while in a flat bed without using bedrails?: A Lot Help needed moving from lying on your back to sitting on the side of a flat bed without using bedrails?: A Lot Help needed moving to and from a bed to a chair (including a wheelchair)?: A Little Help needed standing up from a chair using your arms (e.g., wheelchair or bedside chair)?: A  Little Help needed to walk in hospital room?: A Little Help needed climbing 3-5 steps with a railing? : A Lot 6 Click Score: 15    End of Session   Activity Tolerance: Patient tolerated treatment well Patient left: in chair;with call bell/phone within reach;with family/visitor present Nurse Communication: Mobility status PT Visit Diagnosis: Unsteadiness on feet (R26.81);Difficulty in walking, not elsewhere classified (R26.2);Pain     Time: 6599-3570 PT Time Calculation (min) (ACUTE ONLY): 35 min  Charges:  $Gait Training: 8-22 mins $Self Care/Home Management: 8-22                     Blanchard Kelch PT Acute Rehabilitation Services Pager [phone] Office [phone]    Rada Hay 02/08/2021, 12:28 PM

## 2021-02-08 NOTE — NC FL2 (Signed)
[place] MEDICAID FL2 LEVEL OF CARE SCREENING TOOL     IDENTIFICATION  Patient Name: Natasha Harper Birthdate: 1935/04/19 Sex: female Admission Date (Current Location): 02/05/2021  [facility] and [place] Number:  Producer, television/film/video and Address:  [facility],  [address], [place] [address]      Provider Number: [id number]  Attending Physician Name and Address:  Meredeth Ide, MD  Relative Name and Phone Number:  spouse, Theron Arista @ [phone]    Current Level of Care: Hospital Recommended Level of Care: Skilled Nursing Facility Prior Approval Number:    Date Approved/Denied:   PASRR Number: [id number]  Discharge Plan: SNF    Current Diagnoses: Patient Active Problem List   Diagnosis Date Noted   Compression fracture of thoracic vertebra (HCC) 02/06/2021   Compression fracture of lumbar vertebra (HCC) 02/06/2021   Back pain 02/05/2021   Medicare annual wellness visit, subsequent 12/20/2014   Decreased hearing 10/05/2013   Dyslipidemia    Carotid arterial disease (HCC)    Hypothyroidism    Ejection fraction    Visit for preventive health examination 06/02/2011   Medication side effect 06/02/2011   Disorder of bone and cartilage 04/07/2008   HYPERTENSION, WHITE COAT 04/07/2008   HYPERGLYCEMIA, FASTING 07/21/2007   THYROID NODULE 04/05/2007    Orientation RESPIRATION BLADDER Height & Weight     Self, Time, Situation, Place  Normal Continent, External catheter (currently with purewick) Weight: 115 lb 1.3 oz (52.2 kg) Height:  5\' 1"  (154.9 cm)  BEHAVIORAL SYMPTOMS/MOOD NEUROLOGICAL BOWEL NUTRITION STATUS      Continent    AMBULATORY STATUS COMMUNICATION OF NEEDS Skin   Extensive Assist Verbally Normal                       Personal Care Assistance Level of Assistance  Bathing, Dressing Bathing Assistance: Limited assistance   Dressing Assistance: Limited assistance     Functional Limitations Info             SPECIAL CARE  FACTORS FREQUENCY  PT (By licensed PT), OT (By licensed OT)     PT Frequency: 5x/wk OT Frequency: 5x/wk            Contractures Contractures Info: Not present    Additional Factors Info  Code Status, Allergies Code Status Info: Full Allergies Info: see MAR           Current Medications (02/08/2021):  This is the current hospital active medication list Current Facility-Administered Medications  Medication Dose Route Frequency Provider Last Rate Last Admin   acetaminophen (TYLENOL) tablet 650 mg  650 mg Oral Q6H 04/10/2021, MD   650 mg at 02/08/21 0517   amLODipine (NORVASC) tablet 5 mg  5 mg Oral Daily 04/10/21, MD   5 mg at 02/08/21 04/10/21   aspirin EC tablet 81 mg  81 mg Oral Daily Tu, Ching T, DO   81 mg at 02/08/21 0926   atorvastatin (LIPITOR) tablet 80 mg  80 mg Oral Daily Tu, Ching T, DO   80 mg at 02/08/21 04/10/21   cholecalciferol (VITAMIN D3) tablet 1,000 Units  1,000 Units Oral Daily 3154 T, DO   1,000 Units at 02/08/21 04/10/21   cyclobenzaprine (FLEXERIL) tablet 5 mg  5 mg Oral TID PRN Tu, Ching T, DO       enoxaparin (LOVENOX) injection 40 mg  40 mg Subcutaneous Q24H Tu, Ching T, DO   40 mg at 02/07/21 2057  hydrALAZINE (APRESOLINE) tablet 25 mg  25 mg Oral Q6H PRN Meredeth Ide, MD   25 mg at 02/07/21 1430   ibuprofen (ADVIL) tablet 600 mg  600 mg Oral Q4H PRN Tu, Ching T, DO   600 mg at 02/08/21 0517   levothyroxine (SYNTHROID) tablet 50 mcg  50 mcg Oral Q0600 Tu, Ching T, DO   50 mcg at 02/08/21 0517   lidocaine (LIDODERM) 5 % 1 patch  1 patch Transdermal Q24H Gloris Manchester, MD   1 patch at 02/07/21 1800   omega-3 acid ethyl esters (LOVAZA) capsule 1 g  1 g Oral Daily Tu, Ching T, DO   1 g at 02/08/21 1610   vitamin C (ASCORBIC ACID) tablet 125 mg  125 mg Oral Daily Tu, Ching T, DO   125 mg at 02/07/21 1053     Discharge Medications: Please see discharge summary for a list of discharge medications.  Relevant Imaging Results:  Relevant Lab  Results:   Additional Information SS# [id number];  COVID vaccine x 2 and booster x 1  Aizza Santiago, LCSW

## 2021-02-09 DIAGNOSIS — I10 Essential (primary) hypertension: Secondary | ICD-10-CM

## 2021-02-09 DIAGNOSIS — E039 Hypothyroidism, unspecified: Secondary | ICD-10-CM | POA: Diagnosis not present

## 2021-02-09 DIAGNOSIS — S32000D Wedge compression fracture of unspecified lumbar vertebra, subsequent encounter for fracture with routine healing: Secondary | ICD-10-CM | POA: Diagnosis not present

## 2021-02-09 DIAGNOSIS — S22060A Wedge compression fracture of T7-T8 vertebra, initial encounter for closed fracture: Secondary | ICD-10-CM | POA: Diagnosis not present

## 2021-02-09 NOTE — Progress Notes (Signed)
Physical Therapy Treatment Patient Details Name: Natasha Harper MRN: [id number] DOB: 02-02-1935 Today's Date: 02/09/2021    History of Present Illness Natasha Harper is a 86 y.o. female with medical history significant for carotid artery disease, hypothyroidism, hyperlipidemia who presents with concerns of worsening lower back pain and weakness.Followed by [facility]- MRI showing compression fracture at T10, T11, L1, L2 and L3.In ED CT thoracic and lumbar spine showed no significant change in previous subacute fracture seen on MRI.  There is a new age-indeterminate T8 compression fracture with vertebral body height loss., NS recommended TLSO, Left 5th proximal phalanx fracture( No WB orders)    PT Comments    Pt with marked improvement in activity tolerance and with decreased assist required for most tasks.  Pt and spouse pleased with progress and hopeful for dc home with HHPT follow up vs original plan for SNF follow up.   Follow Up Recommendations  Supervision/Assistance - 24 hour;SNF;Home health PT (dependent on acute stay progress)     Equipment Recommendations  None recommended by PT    Recommendations for Other Services OT consult     Precautions / Restrictions Precautions Precautions: Fall Required Braces or Orthoses: Spinal Brace;Splint/Cast Spinal Brace: Thoracolumbosacral orthotic;Applied in sitting position Splint/Cast: post op shoe on rt. Restrictions Weight Bearing Restrictions: No    Mobility  Bed Mobility Overal bed mobility: Needs Assistance Bed Mobility: Supine to Sit     Supine to sit: HOB elevated;Min assist     General bed mobility comments: Patient refuses rolling despite encouargement stating rolling hurt her back the most. patient able to sit straight up in bed with hand hold and then scoot to the edge without complaints of pain. TLSO donned in sitting.    Transfers Overall transfer level: Needs assistance Equipment used: Rolling walker (2  wheeled) Transfers: Sit to/from Stand Sit to Stand: Min guard Stand pivot transfers: Min assist;+2 physical assistance;+2 safety/equipment       General transfer comment: Min guard to ambulate to bathroom and perorm toilet transfer and then to recilner. No complaints of pain.  Ambulation/Gait Ambulation/Gait assistance: Min Chemical engineer (Feet): 240 Feet Assistive device: Rolling walker (2 wheeled) Gait Pattern/deviations: Step-through pattern     General Gait Details: cues for posture and position from RW   Stairs             Wheelchair Mobility    Modified Rankin (Stroke Patients Only)       Balance Overall balance assessment: Mild deficits observed, not formally tested Sitting-balance support: No upper extremity supported Sitting balance-[name]: Good     Standing balance support: Bilateral upper extremity supported Standing balance-[name]: Fair                              Cognition Arousal/Alertness: Awake/alert Behavior During Therapy: WFL for tasks assessed/performed Overall Cognitive Status: Within Functional Limits for tasks assessed                                 General Comments: anxious about moving and back pain, also TLSO pushing on throat and clavicles      Exercises      General Comments        Pertinent Vitals/Pain Pain Assessment: No/denies pain Faces Pain Scale: Hurts even more Pain Location: low to mid back Pain Descriptors / Indicators: Discomfort;Grimacing;Guarding    Home  Living                      Prior Function            PT Goals (current goals can now be found in the care plan section) Acute Rehab PT Goals Patient Stated Goal: less pain PT Goal Formulation: With patient/family Time For Goal Achievement: 02/20/21 Potential to Achieve Goals: Good Progress towards PT goals: Progressing toward goals    Frequency    Min 3X/week      PT Plan Current plan  remains appropriate    Co-evaluation              AM-PAC PT "6 Clicks" Mobility   Outcome Measure  Help needed turning from your back to your side while in a flat bed without using bedrails?: A Little Help needed moving from lying on your back to sitting on the side of a flat bed without using bedrails?: A Little Help needed moving to and from a bed to a chair (including a wheelchair)?: A Little Help needed standing up from a chair using your arms (e.g., wheelchair or bedside chair)?: A Little Help needed to walk in hospital room?: A Little Help needed climbing 3-5 steps with a railing? : A Little 6 Click Score: 18    End of Session Equipment Utilized During Treatment: Gait belt Activity Tolerance: Patient tolerated treatment well Patient left: in chair;with call bell/phone within reach;with family/visitor present Nurse Communication: Mobility status PT Visit Diagnosis: Unsteadiness on feet (R26.81);Difficulty in walking, not elsewhere classified (R26.2);Pain     Time: 4462-8638 PT Time Calculation (min) (ACUTE ONLY): 28 min  Charges:  $Gait Training: 8-22 mins                     Mauro Kaufmann PT Acute Rehabilitation Services Pager [phone] Office [phone]    Jacari Kirsten 02/09/2021, 1:27 PM

## 2021-02-09 NOTE — Progress Notes (Signed)
[facility]  PROGRESS NOTE  Natasha Harper FWY:[id number] DOB: 11-May-1935 DOA: 02/05/2021 PCP: [name], MD   Brief HPI:   86 year old female with history of carotid artery disease, hypothyroidism, hyperlipidemia presented with worsening lower back pain and generalized weakness. She was seen by [facility] in early July and reportedly had x-ray that were negative for fracture.  She was then told to do physical therapy at a pool which she cannot do due to pain.  Also given tramadol but states it just made her "simple minded" and constipation.  She then presented to orthopedic again on 7/25 and had MRI showing compression fracture at T10, T11, L1, L2 and L3.  Shortly after this MRI she had a fall at home.  States she was walking towards a chair but he had wheels and slipped out from under her causing her to land on her buttocks.  Did not hit her head or have loss of consciousness.  However since then she has been having worsening back pain. She was brought to the ED due to limited mobility and pain.   CT thoracic and lumbar spine showed no significant change in previous subacute fracture seen on MRI.  There is a new age-indeterminate T8 compression fracture with vertebral body height loss.  No high-grade spinal stenosis or large disc herniation.  ED provider discussed with neurosurgery, they recommended TLSO brace.  Subjective   Patient seen and examined, awaiting bed at skilled nursing facility.   Assessment/Plan:    Spinal compression fracture, T8, T10, T11, L1, L2, L3 -Nonoperable as per neurosurgery -Recommends TLSO brace and outpatient follow-up with 2 weeks -Patient wants to avoid opioids for pain -Continue Tylenol as needed, ibuprofen as needed, lidocaine patch -PT eval obtained, patient to go to [facility].  Insurance authorization pending  Left fifth proximal phalanx fracture -Seen on x-ray of the foot -Discussed with orthopedist Dr. Roda Shutters, he recommends putting  postop shoe for a month -No surgical intervention recommended   Hypothyroidism -Continue Synthroid  Hyperlipidemia -Continue statin  Cystic lesion in right pelvis -Incidental finding on MRI -Recommend follow-up ultrasound as outpatient  Multiple cystic lesions in right upper quadrant/pancreatic head -Incidental finding on MRI -LFTs are normal -Outpatient follow-up MRCP  Hypokalemia -replete  Hypertension -Blood pressure has been elevated  -Patient started on amlodipine 5 mg daily  -We will also add losartan 25 mg daily  -Continue hydralazine 25 mg p.o. every 6 hours as needed for BP greater than 160/100.     Scheduled medications:    acetaminophen  650 mg Oral Q6H   amLODipine  5 mg Oral Daily   aspirin EC  81 mg Oral Daily   atorvastatin  80 mg Oral Daily   cholecalciferol  1,000 Units Oral Daily   enoxaparin (LOVENOX) injection  40 mg Subcutaneous Q24H   levothyroxine  50 mcg Oral Q0600   lidocaine  1 patch Transdermal Q24H   omega-3 acid ethyl esters  1 g Oral Daily   vitamin C  125 mg Oral Daily         Data Reviewed:   CBG:  No results for input(s): GLUCAP in the last 168 hours.  SpO2: 96 %    Vitals:   02/08/21 1315 02/08/21 2122 02/09/21 0554 02/09/21 0833  BP: (!) 158/75 (!) 177/78 (!) 193/85 (!) 150/65  Pulse: 100 91 91 98  Resp: 16 16 16 18   Temp: (!) 97.4 F (36.3 C) 98.5 F (36.9 C) 98.8 F (37.1 C) 98 F (36.7  C)  TempSrc: Oral Oral Oral Oral  SpO2: 98% 97% 96% 96%  Weight:      Height:         Intake/Output Summary (Last 24 hours) at 02/09/2021 1002 Last data filed at 02/09/2021 0933 Gross per 24 hour  Intake 600 ml  Output 450 ml  Net 150 ml    08/04 1901 - 08/06 0700 In: 720 [P.O.:720] Out: 350 [Urine:350]  Filed Weights   02/08/21 0430  Weight: 52.2 kg    CBC:  Recent Labs  Lab 02/05/21 1824 02/06/21 0500 02/07/21 0437  WBC 3.6* 3.7* 4.3  HGB 11.8* 10.2* 11.6*  HCT 35.5* 31.0* 34.5*  PLT 259 236 263   MCV 103.8* 106.5* 105.5*  MCH 34.5* 35.1* 35.5*  MCHC 33.2 32.9 33.6  RDW 14.4 14.6 14.5  LYMPHSABS 0.6*  --   --   MONOABS 0.3  --   --   EOSABS 0.0  --   --   BASOSABS 0.0  --   --     Complete metabolic panel:  Recent Labs  Lab 02/05/21 1824 02/07/21 0437  NA 135 135  K 3.2* 4.6  CL 98 102  CO2 25 25  GLUCOSE 95 81  BUN 11 23  CREATININE 0.45 0.48  CALCIUM 9.1 8.9  AST 26  --   ALT 27  --   ALKPHOS 120  --   BILITOT 0.7  --   ALBUMIN 3.7  --     No results for input(s): LIPASE, AMYLASE in the last 168 hours.  Recent Labs  Lab 02/05/21 2030  SARSCOV2NAA NEGATIVE    ------------------------------------------------------------------------------------------------------------------ No results for input(s): CHOL, HDL, LDLCALC, TRIG, CHOLHDL, LDLDIRECT in the last 72 hours.  Lab Results  Component Value Date   HGBA1C 5.6 11/25/2017   ------------------------------------------------------------------------------------------------------------------ No results for input(s): TSH, T4TOTAL, T3FREE, THYROIDAB in the last 72 hours.  Invalid input(s): FREET3 ------------------------------------------------------------------------------------------------------------------ No results for input(s): VITAMINB12, FOLATE, FERRITIN, TIBC, IRON, RETICCTPCT in the last 72 hours.  Coagulation profile No results for input(s): INR, PROTIME in the last 168 hours. No results for input(s): DDIMER in the last 72 hours.  Cardiac Enzymes No results for input(s): CKTOTAL, CKMB, CKMBINDEX, TROPONINI in the last 168 hours.  ------------------------------------------------------------------------------------------------------------------ No results found for: BNP   Antibiotics: Anti-infectives (From admission, onward)    None        Radiology Reports  No results found.    DVT prophylaxis: Lovenox  Code Status: Full code  Family Communication: Discussed with husband at  bedside   Consultants:   Procedures:     Objective    Physical Examination:  General-appears in no acute distress Heart-S1-S2, regular, no murmur auscultated Lungs-clear to auscultation bilaterally, no wheezing or crackles auscultated Abdomen-soft, nontender, no organomegaly Extremities-no edema in the lower extremities Neuro-alert, oriented x3, no focal deficit noted   Status is: Inpatient  Dispo: The patient is from: Home              Anticipated d/c is to: Skilled nursing facility              Anticipated d/c date is: 02/11/2021              Patient currently not stable for discharge  Barrier to discharge-back pain  COVID-19 Labs  No results for input(s): DDIMER, FERRITIN, LDH, CRP in the last 72 hours.  Lab Results  Component Value Date   SARSCOV2NAA NEGATIVE 02/05/2021    Microbiology  Recent Results (from the  past 240 hour(s))  Resp Panel by RT-PCR (Flu A&B, Covid) Nasopharyngeal Swab     Status: None   Collection Time: 02/05/21  8:30 PM   Specimen: Nasopharyngeal Swab; Nasopharyngeal(NP) swabs in vial transport medium  Result Value Ref Range Status   SARS Coronavirus 2 by RT PCR NEGATIVE NEGATIVE Final    Comment: (NOTE) SARS-CoV-2 target nucleic acids are NOT DETECTED.  The SARS-CoV-2 RNA is generally detectable in upper respiratory specimens during the acute phase of infection. The lowest concentration of SARS-CoV-2 viral copies this assay can detect is 138 copies/mL. A negative result does not preclude SARS-Cov-2 infection and should not be used as the sole basis for treatment or other patient management decisions. A negative result may occur with  improper specimen collection/handling, submission of specimen other than nasopharyngeal swab, presence of viral mutation(s) within the areas targeted by this assay, and inadequate number of viral copies(<138 copies/mL). A negative result must be combined with clinical observations, patient history, and  epidemiological information. The expected result is Negative.  Fact Sheet for Patients:  BloggerCourse.com  Fact Sheet for Healthcare Providers:  SeriousBroker.it  This test is no t yet approved or cleared by the [place] FDA and  has been authorized for detection and/or diagnosis of SARS-CoV-2 by FDA under an Emergency Use Authorization (EUA). This EUA will remain  in effect (meaning this test can be used) for the duration of the COVID-19 declaration under Section 564(b)(1) of the Act, 21 U.S.C.section 360bbb-3(b)(1), unless the authorization is terminated  or revoked sooner.    specimens during the acute phase of infection. The lowest concentration of SARS-CoV-2 viral copies this assay can detect is 138 copies/mL. A negative result does not preclude SARS-Cov-2 infection and should not be used as the sole basis for treatment or other patient management decisions. A negative result may occur with  improper specimen collection/handling, submission of specimen other than nasopharyngeal swab, presence of viral mutation(s) within the areas targeted by this assay, and inadequate number of viral copies(<138 copies/mL). A negative result must be c ombined with clinical observations, patient history, and epidemiological information. The expected result is Negative.  Fact Sheet for Patients:  BloggerCourse.com  Fact Sheet for Healthcare Providers:  SeriousBroker.it  This test is not yet approved or cleared by the [place] FDA and  has been authorized for detection and/or diagnosis of SARS-CoV-2 by FDA under an Emergency Use Authorization (EUA). This EUA will remain  in effect (meaning this test can be used) for the duration of the COVID-19 declaration under Section 564(b)(1) of the Act, 21 U.S.C.section 360bbb-3(b)(1), unless the authorization is terminated  or revoked sooner.        Influenza A by PCR NEGATIVE NEGATIVE Final   Influenza B by PCR NEGATIVE NEGATIVE Final    Comment: (NOTE) The Xpert Xpress SARS-CoV-2/FLU/RSV plus assay is intended as an aid in the diagnosis of influenza from Nasopharyngeal swab specimens and should not be used as a sole basis for treatment. Nasal washings and aspirates are unacceptable for Xpert Xpress SARS-CoV-2/FLU/RSV testing.  Fact Sheet for Patients: BloggerCourse.com  Fact Sheet for Healthcare Providers: SeriousBroker.it  This test is not yet approved or cleared by the [place] FDA and has been authorized for detection and/or diagnosis of SARS-CoV-2 by FDA under an Emergency Use Authorization (EUA). This EUA will remain in effect (meaning this test can be used) for the duration of the COVID-19 declaration under Section 564(b)(1) of the Act, 21 U.S.C. section 360bbb-3(b)(1), unless the authorization  is terminated or revoked.  Performed at [facility], [address] [place], [place] [address]          Meredeth Ide   [facility] If 7PM-7AM, please contact night-coverage at [email or web address], Office  [phone]   02/09/2021, 10:02 AM  LOS: 0 days

## 2021-02-09 NOTE — Progress Notes (Signed)
Occupational Therapy Treatment Patient Details Name: Natasha Harper MRN: [id number] DOB: October 14, 1934 Today's Date: 02/09/2021    History of present illness Natasha Harper is a 86 y.o. female with medical history significant for carotid artery disease, hypothyroidism, hyperlipidemia who presents with concerns of worsening lower back pain and weakness.Followed by [facility]- MRI showing compression fracture at T10, T11, L1, L2 and L3.In ED CT thoracic and lumbar spine showed no significant change in previous subacute fracture seen on MRI.  There is a new age-indeterminate T8 compression fracture with vertebral body height loss., NS recommended TLSO, Left 5th proximal phalanx fracture( No WB orders)   OT comments  Patient tolerated supine to sit transfer with only min assist. Min guard to stand with RW and ambulate. Min guard for toilet transfer in bathroom and toileting. Patient tolerating ADLs well today without complaints of pain. Discussion with patient and husband in room in regards to POC. Patient doing well today and can go home with [facility] therapy. Discussed home DME, how to perform car transfer and reiteration of spinal precautions. Will continue to follow acutely.    Follow Up Recommendations  Home health OT    Equipment Recommendations  None recommended by OT    Recommendations for Other Services      Precautions / Restrictions Precautions Precautions: Fall Required Braces or Orthoses: Spinal Brace;Splint/Cast Spinal Brace: Thoracolumbosacral orthotic;Applied in sitting position Splint/Cast: post op shoe on rt. Restrictions Weight Bearing Restrictions: No       Mobility Bed Mobility Overal bed mobility: Needs Assistance Bed Mobility: Supine to Sit     Supine to sit: HOB elevated;Min assist     General bed mobility comments: Patient refuses rolling despite encouargement stating rolling hurt her back the most. patient able to sit straight up in bed with hand hold and then  scoot to the edge without complaints of pain. TLSO donned in sitting.    Transfers Overall transfer level: Needs assistance Equipment used: Rolling walker (2 wheeled) Transfers: Sit to/from Stand Sit to Stand: Min guard         General transfer comment: Min guard to ambulate to bathroom and perorm toilet transfer and then to recilner. No complaints of pain.    Balance Overall balance assessment: Mild deficits observed, not formally tested Sitting-balance support: No upper extremity supported Sitting balance-[name]: Good     Standing balance support: Bilateral upper extremity supported Standing balance-[name]: Fair                             ADL either performed or assessed with clinical judgement   ADL Overall ADL's : Needs assistance/impaired                         Toilet Transfer: Min guard;Ambulation;RW;Grab bars;Regular Social worker and Hygiene: Min guard;Sit to/from stand       Functional mobility during ADLs: Rolling walker       Vision Patient Visual Report: No change from baseline     Perception     Praxis      Cognition Arousal/Alertness: Awake/alert Behavior During Therapy: WFL for tasks assessed/performed Overall Cognitive Status: Within Functional Limits for tasks assessed  Exercises     Shoulder Instructions       General Comments      Pertinent Vitals/ Pain       Pain Assessment: No/denies pain  Home Living                                          Prior Functioning/Environment              Frequency  Min 2X/week        Progress Toward Goals  OT Goals(current goals can now be found in the care plan section)  Progress towards OT goals: Progressing toward goals  Acute Rehab OT Goals Patient Stated Goal: less pain OT Goal Formulation: With patient Time For Goal Achievement:  02/21/21 Potential to Achieve Goals: Good  Plan Discharge plan needs to be updated    Co-evaluation                 AM-PAC OT "6 Clicks" Daily Activity     Outcome Measure   Help from another person eating meals?: None Help from another person taking care of personal grooming?: A Little Help from another person toileting, which includes using toliet, bedpan, or urinal?: A Little Help from another person bathing (including washing, rinsing, drying)?: A Little Help from another person to put on and taking off regular upper body clothing?: A Little Help from another person to put on and taking off regular lower body clothing?: A Lot 6 Click Score: 18    End of Session Equipment Utilized During Treatment: Rolling walker  OT Visit Diagnosis: Unsteadiness on feet (R26.81);Other abnormalities of gait and mobility (R26.89);History of falling (Z91.81);Muscle weakness (generalized) (M62.81);Pain   Activity Tolerance Patient tolerated treatment well   Patient Left in chair;with call bell/phone within reach;with family/visitor present   Nurse Communication Mobility status        Time: 7106-2694 OT Time Calculation (min): 28 min  Charges: OT General Charges $OT Visit: 1 Visit OT Treatments $Self Care/Home Management : 8-22 mins  Waldron Session, OTR/L Acute Care Rehab Services  Office [phone] Pager: [phone]    Kelli Churn 02/09/2021, 1:17 PM

## 2021-02-10 DIAGNOSIS — E039 Hypothyroidism, unspecified: Secondary | ICD-10-CM | POA: Diagnosis not present

## 2021-02-10 DIAGNOSIS — S22060A Wedge compression fracture of T7-T8 vertebra, initial encounter for closed fracture: Secondary | ICD-10-CM | POA: Diagnosis not present

## 2021-02-10 DIAGNOSIS — I10 Essential (primary) hypertension: Secondary | ICD-10-CM | POA: Diagnosis not present

## 2021-02-10 DIAGNOSIS — S32000D Wedge compression fracture of unspecified lumbar vertebra, subsequent encounter for fracture with routine healing: Secondary | ICD-10-CM | POA: Diagnosis not present

## 2021-02-10 MED ORDER — AMLODIPINE BESYLATE 5 MG PO TABS: 5.0000 mg | ORAL_TABLET | Freq: Every day | ORAL | 3 refills | Status: DC

## 2021-02-10 MED ORDER — LIDOCAINE 5 % EX PTCH: 1.0000 | MEDICATED_PATCH | CUTANEOUS | 0 refills | Status: DC

## 2021-02-10 NOTE — Progress Notes (Signed)
Orthopedic Tech Progress Note Patient Details:  LINNAEA AHN 1934/08/20 [id number]  Went to 3E before pt discharge to confirm with Darl Pikes, RN, that the PO shoe was actually on the correct foot. X-ray report/findings state right fifth proximal phalanx fracture, not left, which is what is documented in all other encounter notes although there are no films obtained of the Lt foot. Helped pt apply the PO shoe and TLSO. Husband was at bedside and wanted to see how it was put on once more before being discharged.   Ortho Devices Type of Ortho Device: Postop shoe/boot (TLSO) Ortho Device/Splint Location: RLE Ortho Device/Splint Interventions: Application   Post Interventions Patient Tolerated: Well Instructions Provided: Care of device, Adjustment of device  Ark Agrusa Carmine Savoy 02/10/2021, 12:24 PM

## 2021-02-10 NOTE — Discharge Instructions (Signed)
Wear postop shoe for 1 month

## 2021-02-10 NOTE — Progress Notes (Signed)
Patient and spouse were given discharge instructions, and all questions were answered. Patient was stable for discharge and was taken to the main exit by wheelchair. 

## 2021-02-10 NOTE — TOC Transition Note (Signed)
Transition of Care [facility]) - CM/SW Discharge Note   Patient Details  Name: Natasha Harper MRN: [id number] Date of Birth: Jan 01, 1935  Transition of Care [facility] CM/SW Contact:  Deakin Lacek, Vinnie Langton, LCSW Phone Number: 02/10/2021, 10:36 AM   Clinical Narrative:     Home Health Physical Therapy has been arranged for patient [facility].  English as a second language teacher received from [employer].    Barriers to Discharge: Continued Medical Work up, English as a second language teacher   Patient Goals and CMS Choice Patient states their goals for this hospitalization and ongoing recovery are:: to return home following SNF rehab; decrease in overall pain CMS Medicare.gov Compare Post Acute Care list provided to:: Patient Choice offered to / list presented to : Patient, Spouse  Discharge Placement     Home with home health.    Discharge Plan and Services In-house Referral: Clinical Social Work   Post Acute Care Choice: Home Health Physical Therapy       DME Arranged: N/A DME Agency: NA    Social Determinants of Health (SDOH) Interventions     Readmission Risk Interventions No flowsheet data found.  Danford Bad, BSW, MSW, [employer]  Licensed Visual merchandiser  [employer]  Mailing Address-[address] [place], [place] [address] Physical Address-[address] [place], [place] [address] Toll Free Main # [phone] Fax # [phone] Cell # [phone]  [email or web address]

## 2021-02-10 NOTE — Discharge Summary (Signed)
Physician Discharge Summary  Natasha Harper ZOX:[id number] DOB: 14-Aug-1934 DOA: 02/05/2021  PCP: [name], MD  Admit date: 02/05/2021 Discharge date: 02/10/2021  Time spent: 60 minutes  Recommendations for Outpatient Follow-up:  Follow-up PCP in 2 weeks Follow-up neurosurgery in 1 weeks   Discharge Diagnoses:  Principal Problem:   Compression fracture of lumbar vertebra [facility]) Active Problems:   Dyslipidemia   Hypothyroidism   Back pain   Compression fracture of thoracic vertebra [facility])   Discharge Condition: Stable  Diet recommendation: Heart healthy diet  Filed Weights   02/08/21 0430  Weight: 52.2 kg    History of present illness:   86 year old female with history of carotid artery disease, hypothyroidism, hyperlipidemia presented with worsening lower back pain and generalized weakness. She was seen by [facility] in early July and reportedly had x-ray that were negative for fracture.  She was then told to do physical therapy at a pool which she cannot do due to pain.  Also given tramadol but states it just made her "simple minded" and constipation.  She then presented to orthopedic again on 7/25 and had MRI showing compression fracture at T10, T11, L1, L2 and L3.  Shortly after this MRI she had a fall at home.  States she was walking towards a chair but he had wheels and slipped out from under her causing her to land on her buttocks.  Did not hit her head or have loss of consciousness.  However since then she has been having worsening back pain. She was brought to the ED due to limited mobility and pain.   CT thoracic and lumbar spine showed no significant change in previous subacute fracture seen on MRI.  There is a new age-indeterminate T8 compression fracture with vertebral body height loss.  No high-grade spinal stenosis or large disc herniation.   ED provider discussed with neurosurgery, they recommended TLSO brace  Hospital Course:   Spinal compression fracture,  T8, T10, T11, L1, L2, L3 -Nonoperable as per neurosurgery -Recommends TLSO brace and outpatient follow-up with 2 weeks -Patient wants to avoid opioids for pain -Continue Tylenol as needed, ibuprofen as needed, lidocaine patch -PT eval obtained, patient to go home with home health PT   Left fifth proximal phalanx fracture -Seen on x-ray of the foot -Discussed with orthopedist Dr. Roda Shutters, he recommends putting postop shoe for a month -No surgical intervention recommended     Hypothyroidism -Continue Synthroid   Hyperlipidemia -Continue statin   Cystic lesion in right pelvis -Incidental finding on MRI -Recommend follow-up ultrasound as outpatient   Multiple cystic lesions in right upper quadrant/pancreatic head -Incidental finding on MRI -LFTs are normal -Outpatient follow-up MRCP   Hypokalemia -replete   Hypertension -Blood pressure has been elevated  -Patient started on amlodipine 5 mg daily            Procedures:   Consultations:   Discharge Exam: Vitals:   02/09/21 2129 02/10/21 0539  BP: 137/75 (!) 145/67  Pulse: 92 94  Resp: 18 18  Temp: 98.9 F (37.2 C) 98.7 F (37.1 C)  SpO2: 97% 96%    General: Appears in no acute distress Cardiovascular: S1-S2, regular Respiratory: Clear to auscultation bilaterally  Discharge Instructions   Discharge Instructions     Diet - low sodium heart healthy   Complete by: As directed    Increase activity slowly   Complete by: As directed       Allergies as of 02/10/2021       Reactions  Actonel [risedronate Sodium]    bloating   Evista [raloxifene]    Abdominal cramps   Fosamax [alendronate Sodium]    Abdominal cramps        Medication List     TAKE these medications    acetaminophen 500 MG tablet Commonly known as: TYLENOL Take 1,000 mg by mouth every 6 (six) hours as needed for moderate pain.   amLODipine 5 MG tablet Commonly known as: NORVASC Take 1 tablet (5 mg total) by mouth daily.    aspirin 81 MG tablet Take 81 mg by mouth daily.   atorvastatin 80 MG tablet Commonly known as: LIPITOR TAKE 1 TABLET BY MOUTH DAILY   baclofen 10 MG tablet Commonly known as: LIORESAL Take 0.5-1 tablets (5-10 mg total) by mouth 3 (three) times daily as needed for muscle spasms.   cholecalciferol 25 MCG (1000 UNIT) tablet Commonly known as: VITAMIN D3 Take 1,000 Units by mouth daily.   fish oil-omega-3 fatty acids 1000 MG capsule Take 1,000 mg by mouth daily.   lidocaine 5 % Commonly known as: LIDODERM Place 1 patch onto the skin daily. Remove & Discard patch within 12 hours or as directed by MD   MULTIPLE VITAMIN PO Take 1 tablet by mouth daily.   Synthroid 50 MCG tablet Generic drug: levothyroxine TAKE 1 TABLET BY MOUTH DAILY What changed:  how much to take when to take this   vitamin C 100 MG tablet Take 100 mg by mouth daily.       Allergies  Allergen Reactions   Actonel [Risedronate Sodium]     bloating   Evista [Raloxifene]     Abdominal cramps   Fosamax [Alendronate Sodium]     Abdominal cramps    Contact information for after-discharge care     Destination     [facility] .   Service: Skilled Nursing Contact information: [address] [place] [address] [phone]                      The results of significant diagnostics from this hospitalization (including imaging, microbiology, ancillary and laboratory) are listed below for reference.    Significant Diagnostic Studies: DG Pelvis 1-2 Views  Result Date: 02/05/2021 CLINICAL DATA:  Fall. EXAM: PELVIS - 1-2 VIEW COMPARISON:  None. FINDINGS: Overlying artifact obscures assessment of the left sacrum and sacroiliac joints. No evidence of acute pelvic fracture. Both femoral heads are seated. Pubic rami appear intact. Pubic symphysis is congruent. The bones are diffusely under mineralized. IMPRESSION: No evidence of pelvic fracture. Overlying artifact  obscures assessment of the left sacrum and sacroiliac joints. Electronically Signed   By: Narda Rutherford M.D.   On: 02/05/2021 21:16   CT Thoracic Spine Wo Contrast  Result Date: 02/05/2021 CLINICAL DATA:  Persistent back pain and immobility. Patient fell again 5 days ago. Spine fracture, thoracic, traumatic. EXAM: CT THORACIC AND LUMBAR SPINE WITHOUT CONTRAST TECHNIQUE: Multidetector CT imaging of the thoracic and lumbar spine was performed without contrast. Multiplanar CT image reconstructions were also generated. COMPARISON:  MRI lumbar spine 01/28/2021. No other comparison studies. FINDINGS: CT THORACIC SPINE FINDINGS Alignment: Mild convex right scoliosis. The sagittal alignment is normal. Vertebrae: The bones are diffusely demineralized. As demonstrated on recent MRI, there are compression fractures at T10 (approximately 60% loss of vertebral body height) and T11 (approximately 80% loss of vertebral body height) which appear unchanged. Both of these fractures are associated with mild osseous retropulsion, and demonstrated marrow edema  on MRI consistent with recent or unhealed fractures. In addition, there is a superior endplate compression fracture at T8 which was not previously imaged. This results in approximately 75% loss of vertebral body height. There is a mild superior endplate compression fracture at T5, resulting in approximately 15% loss of vertebral body height. No other vertebral body fractures or focal osseous lesions. Suspected old fractures of the 8th ribs bilaterally and right 9th rib near the costovertebral junctions. Paraspinal and other soft tissues: Mild paraspinous edema at T10 and T11 supporting recent fractures at these levels. No significant paraspinal hematoma. Diffuse atherosclerosis of the aorta, great vessels and coronary arteries. Small bilateral pleural effusions with bibasilar atelectasis and underlying mild emphysema. Disc levels: No large disc herniation or high-grade  spinal stenosis is identified. There is no significant acquired disc pathology at or above the T7-8 level. There is vacuum phenomenon within the T8-9 and T9-10 discs. As above, there is mild osseous retropulsion at T10 and T11 without resulting significant spinal stenosis. CT LUMBAR SPINE FINDINGS Segmentation: There are 5 lumbar type vertebral bodies. Alignment: Degenerative grade 1 anterolisthesis at L4-5. Vertebrae: Chronic compression fracture at L1 resulting in 50% loss of vertebral body height and mild osseous retropulsion is unchanged from the MRI. There are minimal superior endplate compression fractures at L2 and L3 which are also unchanged. The L3 fracture is associated with minimal osseous retropulsion. Both of these fractures were associated with mild superior endplate edema on MRI. No new fractures. Paraspinal and other soft tissues: No acute paraspinal findings. Extensive aortic and branch vessel atherosclerosis. The bladder appears mildly distended. Disc levels: The disc space findings are unchanged from the recent MRI. At L4-5, there is a grade 1 anterolisthesis secondary to bilateral facet hypertrophy, and this contributes to mild spinal stenosis, narrowing of the lateral recesses and right foramen. IMPRESSION: 1. The subacute fractures at T10, T11, L2 and L3 demonstrated on MRI 1 week ago have not significantly changed in the interval. No progressive loss of vertebral body height or progressive osseous retropulsion. 2. In addition, there is an age-indeterminate T8 compression deformity resulting in approximately 75% loss of vertebral body height, not previously imaged. Mild T5 compression deformity, not previously imaged. 3. Stable healed chronic L1 compression fracture. 4. Mild multilevel spondylosis without high-grade spinal stenosis or large disc herniation. Stable degenerative anterolisthesis at L4-5. 5. Extensive Aortic Atherosclerosis (ICD10-I70.0). Electronically Signed   By: Carey Bullocks  M.D.   On: 02/05/2021 16:33   CT Lumbar Spine Wo Contrast  Result Date: 02/05/2021 CLINICAL DATA:  Persistent back pain and immobility. Patient fell again 5 days ago. Spine fracture, thoracic, traumatic. EXAM: CT THORACIC AND LUMBAR SPINE WITHOUT CONTRAST TECHNIQUE: Multidetector CT imaging of the thoracic and lumbar spine was performed without contrast. Multiplanar CT image reconstructions were also generated. COMPARISON:  MRI lumbar spine 01/28/2021. No other comparison studies. FINDINGS: CT THORACIC SPINE FINDINGS Alignment: Mild convex right scoliosis. The sagittal alignment is normal. Vertebrae: The bones are diffusely demineralized. As demonstrated on recent MRI, there are compression fractures at T10 (approximately 60% loss of vertebral body height) and T11 (approximately 80% loss of vertebral body height) which appear unchanged. Both of these fractures are associated with mild osseous retropulsion, and demonstrated marrow edema on MRI consistent with recent or unhealed fractures. In addition, there is a superior endplate compression fracture at T8 which was not previously imaged. This results in approximately 75% loss of vertebral body height. There is a mild superior endplate compression fracture at T5,  resulting in approximately 15% loss of vertebral body height. No other vertebral body fractures or focal osseous lesions. Suspected old fractures of the 8th ribs bilaterally and right 9th rib near the costovertebral junctions. Paraspinal and other soft tissues: Mild paraspinous edema at T10 and T11 supporting recent fractures at these levels. No significant paraspinal hematoma. Diffuse atherosclerosis of the aorta, great vessels and coronary arteries. Small bilateral pleural effusions with bibasilar atelectasis and underlying mild emphysema. Disc levels: No large disc herniation or high-grade spinal stenosis is identified. There is no significant acquired disc pathology at or above the T7-8 level. There  is vacuum phenomenon within the T8-9 and T9-10 discs. As above, there is mild osseous retropulsion at T10 and T11 without resulting significant spinal stenosis. CT LUMBAR SPINE FINDINGS Segmentation: There are 5 lumbar type vertebral bodies. Alignment: Degenerative grade 1 anterolisthesis at L4-5. Vertebrae: Chronic compression fracture at L1 resulting in 50% loss of vertebral body height and mild osseous retropulsion is unchanged from the MRI. There are minimal superior endplate compression fractures at L2 and L3 which are also unchanged. The L3 fracture is associated with minimal osseous retropulsion. Both of these fractures were associated with mild superior endplate edema on MRI. No new fractures. Paraspinal and other soft tissues: No acute paraspinal findings. Extensive aortic and branch vessel atherosclerosis. The bladder appears mildly distended. Disc levels: The disc space findings are unchanged from the recent MRI. At L4-5, there is a grade 1 anterolisthesis secondary to bilateral facet hypertrophy, and this contributes to mild spinal stenosis, narrowing of the lateral recesses and right foramen. IMPRESSION: 1. The subacute fractures at T10, T11, L2 and L3 demonstrated on MRI 1 week ago have not significantly changed in the interval. No progressive loss of vertebral body height or progressive osseous retropulsion. 2. In addition, there is an age-indeterminate T8 compression deformity resulting in approximately 75% loss of vertebral body height, not previously imaged. Mild T5 compression deformity, not previously imaged. 3. Stable healed chronic L1 compression fracture. 4. Mild multilevel spondylosis without high-grade spinal stenosis or large disc herniation. Stable degenerative anterolisthesis at L4-5. 5. Extensive Aortic Atherosclerosis (ICD10-I70.0). Electronically Signed   By: Carey Bullocks M.D.   On: 02/05/2021 16:33   MR Lumbar Spine w/o contrast  Result Date: 01/28/2021 EXAM: MRI LUMBAR SPINE  WITHOUT CONTRAST TECHNIQUE: Multiplanar, multisequence MR imaging of the lumbar spine was performed. No intravenous contrast was administered. COMPARISON:  None. FINDINGS: Segmentation: Standard segmentation is assumed with the inferior-most fully formed intervertebral disc labeled L5-S1. Alignment:  Grade 1 anterolisthesis of L4 on L5. Vertebrae: Compression fractures at T10 (60% height loss), T11 (80% height loss), L1 (60% height loss) L2 (minimal superior endplate deformity), L3 (20% height loss with superior endplate deformity). Marrow edema involving the T10 and T11 vertebral bodies and the L2 and L3 superior endplates suggests that these fractures are recent or unhealed. There is mild bony retropulsion along the superior L1 endplate without significant canal stenosis. No substantial marrow edema involving the L1 fracture, suggesting this fracture is remote. Heterogeneous marrow without focal suspicious lesion. Conus medullaris and cauda equina: Conus extends to the L1-L2 level. Conus appears normal. Paraspinal and other soft tissues: Prominent T2 hypointensity in the left upper quadrant likely represents a distended stomach. There are multiple cystic lesions in the the right upper quadrant, including in the region of the pancreatic head. There may also be dilation of the common bile duct. Bilateral renal cysts. Approximately 3.2 cm cystic lesion in the right anatomic pelvis with  internal septation, potentially adnexal (see series 2, image 1 and series 5, image 42). Disc levels: Motion limited evaluation.  Within this limitation: T12-L1: Mild disc bulging and facet hypertrophy without evidence of significant canal or foraminal stenosis. L1-L2: Mild facet hypertrophy and endplate spurring without significant canal or foraminal stenosis. L2-L3: Mild facet hypertrophy and mild disc bulging. Slight retropulsion along the superior L1 endplate. No significant canal or foraminal stenosis. L3-L4: Broad disc bulge with  superimposed right subarticular/foraminal disc protrusion. Resulting mild right foraminal stenosis without significant canal or left foraminal stenosis. L4-L5: Grade 1 anterolisthesis of L4 on L5. Uncovering the disc with superimposed right eccentric disc bulge and moderate right greater than left facet hypertrophy. Resulting mild right foraminal stenosis, mild canal stenosis, and bilateral subarticular recess narrowing. L5-S1: Mild broad disc bulge. Mild bilateral subarticular recess narrowing without significant canal stenosis. IMPRESSION: 1. Compression fractures at T10, T11, L1, L2, and L3, as detailed above. Marrow edema involving the T10 and T11 vertebral bodies and the L2 and L3 superior endplates suggests that these fractures are recent or unhealed. 2. Evaluation of the disc levels is limited by patient motion. Degenerative change appears greatest at L4-L5 where there is grade 1 anterolisthesis with mild canal stenosis, bilateral subarticular recess narrowing, and mild right foraminal stenosis. 3. Multiple cystic lesions in the right upper quadrant, including in the region of the pancreatic head. There may also be dilation of the common bile duct. These findings are suboptimally evaluated and partially imaged on this study and an MRCP is recommended to further evaluate. 4. Approximately 3.2 cm cystic lesion in the right anatomic pelvis with internal septation, incompletely imaged and potentially adnexal. Recommend follow-up ultrasound. 5. Distended stomach. These results will be called to the ordering clinician or representative by the Radiologist Assistant, and communication documented in the PACS or [facility] Dashboard. Electronically Signed   By: FeConstellation Energyliberto HartsFrederick S Jones MD   On: 01/28/2021 18:27   DG Foot 2 Views Right  Result Date: 02/05/2021 CLINICAL DATA:  Fall.  Pain. EXAM: RIGHT FOOT - 2 VIEW COMPARISON:  None. FINDINGS: AP and lateral views. Osteopenia. Vascular calcifications. Minimally displaced,  transverse fracture involving the proximal portion of the proximal phalanx of the fifth digit. No definite intra-articular extension. IMPRESSION: 1. Proximal fifth phalanx fracture. 2. Osteopenia. Electronically Signed   By: Jeronimo GreavesKyle  Talbot M.D.   On: 02/05/2021 21:15    Microbiology: Recent Results (from the past 240 hour(s))  Resp Panel by RT-PCR (Flu A&B, Covid) Nasopharyngeal Swab     Status: None   Collection Time: 02/05/21  8:30 PM   Specimen: Nasopharyngeal Swab; Nasopharyngeal(NP) swabs in vial transport medium  Result Value Ref Range Status   SARS Coronavirus 2 by RT PCR NEGATIVE NEGATIVE Final    Comment: (NOTE) SARS-CoV-2 target nucleic acids are NOT DETECTED.  The SARS-CoV-2 RNA is generally detectable in upper respiratory specimens during the acute phase of infection. The lowest concentration of SARS-CoV-2 viral copies this assay can detect is 138 copies/mL. A negative result does not preclude SARS-Cov-2 infection and should not be used as the sole basis for treatment or other patient management decisions. A negative result may occur with  improper specimen collection/handling, submission of specimen other than nasopharyngeal swab, presence of viral mutation(s) within the areas targeted by this assay, and inadequate number of viral copies(<138 copies/mL). A negative result must be combined with clinical observations, patient history, and epidemiological information. The expected result is Negative.  Fact Sheet for Patients:  BloggerCourse.comhttps://www.fda.gov/media/152166/download  Fact  Sheet for Healthcare Providers:  SeriousBroker.it  This test is no t yet approved or cleared by the [place] FDA and  has been authorized for detection and/or diagnosis of SARS-CoV-2 by FDA under an Emergency Use Authorization (EUA). This EUA will remain  in effect (meaning this test can be used) for the duration of the COVID-19 declaration under Section 564(b)(1) of the  Act, 21 U.S.C.section 360bbb-3(b)(1), unless the authorization is terminated  or revoked sooner.    specimens during the acute phase of infection. The lowest concentration of SARS-CoV-2 viral copies this assay can detect is 138 copies/mL. A negative result does not preclude SARS-Cov-2 infection and should not be used as the sole basis for treatment or other patient management decisions. A negative result may occur with  improper specimen collection/handling, submission of specimen other than nasopharyngeal swab, presence of viral mutation(s) within the areas targeted by this assay, and inadequate number of viral copies(<138 copies/mL). A negative result must be c ombined with clinical observations, patient history, and epidemiological information. The expected result is Negative.  Fact Sheet for Patients:  BloggerCourse.com  Fact Sheet for Healthcare Providers:  SeriousBroker.it  This test is not yet approved or cleared by the [place] FDA and  has been authorized for detection and/or diagnosis of SARS-CoV-2 by FDA under an Emergency Use Authorization (EUA). This EUA will remain  in effect (meaning this test can be used) for the duration of the COVID-19 declaration under Section 564(b)(1) of the Act, 21 U.S.C.section 360bbb-3(b)(1), unless the authorization is terminated  or revoked sooner.       Influenza A by PCR NEGATIVE NEGATIVE Final   Influenza B by PCR NEGATIVE NEGATIVE Final    Comment: (NOTE) The Xpert Xpress SARS-CoV-2/FLU/RSV plus assay is intended as an aid in the diagnosis of influenza from Nasopharyngeal swab specimens and should not be used as a sole basis for treatment. Nasal washings and aspirates are unacceptable for Xpert Xpress SARS-CoV-2/FLU/RSV testing.  Fact Sheet for Patients: BloggerCourse.com  Fact Sheet for Healthcare  Providers: SeriousBroker.it  This test is not yet approved or cleared by the [place] FDA and has been authorized for detection and/or diagnosis of SARS-CoV-2 by FDA under an Emergency Use Authorization (EUA). This EUA will remain in effect (meaning this test can be used) for the duration of the COVID-19 declaration under Section 564(b)(1) of the Act, 21 U.S.C. section 360bbb-3(b)(1), unless the authorization is terminated or revoked.  Performed at [facility], [address] [place], [place] [address]      Labs: Basic Metabolic Panel: Recent Labs  Lab 02/05/21 1824 02/07/21 0437  NA 135 135  K 3.2* 4.6  CL 98 102  CO2 25 25  GLUCOSE 95 81  BUN 11 23  CREATININE 0.45 0.48  CALCIUM 9.1 8.9   Liver Function Tests: Recent Labs  Lab 02/05/21 1824  AST 26  ALT 27  ALKPHOS 120  BILITOT 0.7  PROT 7.1  ALBUMIN 3.7   No results for input(s): LIPASE, AMYLASE in the last 168 hours. No results for input(s): AMMONIA in the last 168 hours. CBC: Recent Labs  Lab 02/05/21 1824 02/06/21 0500 02/07/21 0437  WBC 3.6* 3.7* 4.3  NEUTROABS 2.6  --   --   HGB 11.8* 10.2* 11.6*  HCT 35.5* 31.0* 34.5*  MCV 103.8* 106.5* 105.5*  PLT 259 236 263   Cardiac Enzymes: No results for input(s): CKTOTAL, CKMB, CKMBINDEX, TROPONINI in the last 168 hours. BNP: BNP (last 3  results) No results for input(s): BNP in the last 8760 hours.  ProBNP (last 3 results) No results for input(s): PROBNP in the last 8760 hours.  CBG: No results for input(s): GLUCAP in the last 168 hours.     Signed:  Meredeth Ide MD.  [facility] 02/10/2021, 8:50 AM

## 2021-02-12 DIAGNOSIS — M419 Scoliosis, unspecified: Secondary | ICD-10-CM | POA: Diagnosis not present

## 2021-02-12 DIAGNOSIS — E039 Hypothyroidism, unspecified: Secondary | ICD-10-CM | POA: Diagnosis not present

## 2021-02-12 DIAGNOSIS — S32019D Unspecified fracture of first lumbar vertebra, subsequent encounter for fracture with routine healing: Secondary | ICD-10-CM | POA: Diagnosis not present

## 2021-02-12 DIAGNOSIS — I6529 Occlusion and stenosis of unspecified carotid artery: Secondary | ICD-10-CM | POA: Diagnosis not present

## 2021-02-12 DIAGNOSIS — I10 Essential (primary) hypertension: Secondary | ICD-10-CM | POA: Diagnosis not present

## 2021-02-12 DIAGNOSIS — E7800 Pure hypercholesterolemia, unspecified: Secondary | ICD-10-CM | POA: Diagnosis not present

## 2021-02-12 DIAGNOSIS — S92512D Displaced fracture of proximal phalanx of left lesser toe(s), subsequent encounter for fracture with routine healing: Secondary | ICD-10-CM | POA: Diagnosis not present

## 2021-02-12 DIAGNOSIS — S22079D Unspecified fracture of T9-T10 vertebra, subsequent encounter for fracture with routine healing: Secondary | ICD-10-CM | POA: Diagnosis not present

## 2021-02-12 DIAGNOSIS — S22069D Unspecified fracture of T7-T8 vertebra, subsequent encounter for fracture with routine healing: Secondary | ICD-10-CM | POA: Diagnosis not present

## 2021-02-12 DIAGNOSIS — M810 Age-related osteoporosis without current pathological fracture: Secondary | ICD-10-CM | POA: Diagnosis not present

## 2021-02-12 DIAGNOSIS — I2510 Atherosclerotic heart disease of native coronary artery without angina pectoris: Secondary | ICD-10-CM | POA: Diagnosis not present

## 2021-02-12 DIAGNOSIS — N6019 Diffuse cystic mastopathy of unspecified breast: Secondary | ICD-10-CM | POA: Diagnosis not present

## 2021-02-12 DIAGNOSIS — S32039D Unspecified fracture of third lumbar vertebra, subsequent encounter for fracture with routine healing: Secondary | ICD-10-CM | POA: Diagnosis not present

## 2021-02-12 DIAGNOSIS — S32029D Unspecified fracture of second lumbar vertebra, subsequent encounter for fracture with routine healing: Secondary | ICD-10-CM | POA: Diagnosis not present

## 2021-02-12 DIAGNOSIS — M4316 Spondylolisthesis, lumbar region: Secondary | ICD-10-CM | POA: Diagnosis not present

## 2021-02-12 DIAGNOSIS — S9031XD Contusion of right foot, subsequent encounter: Secondary | ICD-10-CM | POA: Diagnosis not present

## 2021-02-15 ENCOUNTER — Telehealth: Payer: Self-pay

## 2021-02-15 NOTE — Telephone Encounter (Signed)
[facility] called to give physical therapy orders and to inform that patient is taking medications not listed on the discharge summery. Phone # [phone]

## 2021-02-18 NOTE — Telephone Encounter (Signed)
I cannot answer this inquiry  I have not seen nor had a visit since her hospitalization and recent fall . Please inquire as to her medical status   did she go to [facility]? has she seen the neurosurgery or other spine specialist?  Physical therapy orders should come from the physical medicine  orthopedic neurosurgery specialist.  Who has been managing her case.  She can make a in person visit with me if needed.

## 2021-02-18 NOTE — Telephone Encounter (Signed)
I spoke with the pt and she reported that she is feeling fair and has been seeing physical therapists from [facility]. Pt stated that therapist is supposed to get her fitted for compression stocking for her legs. Pt stated that she has not heard from neurosurgery or other spine specialist. Pt declined an office vist and she stated she does not need to be seen at the moment. I informed pt to contact our office if she needs assistance with any future problem.

## 2021-02-21 ENCOUNTER — Telehealth: Payer: Self-pay | Admitting: Internal Medicine

## 2021-02-21 NOTE — Telephone Encounter (Signed)
Free from [facility] call and stated he need a verbal order for a nurse because of pt swollen legs.

## 2021-02-24 NOTE — Telephone Encounter (Signed)
Ok  But will need a visit   in the future month by medicare rules for referral.

## 2021-02-25 ENCOUNTER — Telehealth: Payer: Self-pay

## 2021-02-25 NOTE — Telephone Encounter (Signed)
Natasha Harper from [facility] care called for verbal orders Natasha Harper says its is ok to leave verbal orders on voicemail [phone]

## 2021-02-25 NOTE — Telephone Encounter (Signed)
Left a message to return my call.

## 2021-02-26 NOTE — Telephone Encounter (Signed)
Shereeh with [facility] has been given verbal orders.

## 2021-02-28 ENCOUNTER — Telehealth: Payer: Self-pay | Admitting: Internal Medicine

## 2021-02-28 DIAGNOSIS — S22070A Wedge compression fracture of T9-T10 vertebra, initial encounter for closed fracture: Secondary | ICD-10-CM | POA: Diagnosis not present

## 2021-02-28 DIAGNOSIS — R030 Elevated blood-pressure reading, without diagnosis of hypertension: Secondary | ICD-10-CM | POA: Diagnosis not present

## 2021-02-28 DIAGNOSIS — S22080A Wedge compression fracture of T11-T12 vertebra, initial encounter for closed fracture: Secondary | ICD-10-CM | POA: Diagnosis not present

## 2021-02-28 NOTE — Telephone Encounter (Signed)
Pt call and stated she want a call back about a  diuretic  .

## 2021-02-28 NOTE — Telephone Encounter (Signed)
I cannot prescribe a new medication without a visit  assessment .   Please contact patient  assess what is going on and make appt if not already on  schedule.

## 2021-03-01 ENCOUNTER — Telehealth: Payer: Self-pay

## 2021-03-01 NOTE — Telephone Encounter (Signed)
I spoke with the pt and she stated that Dr. Dutch Quint with Neurosurgery wanted her to ask PCP for Diuretic because of edema in her legs that has been ongoing for 1 month. Patient reported that her skin has been breaking on her lower legs. Patient declined visit with one of our other providers in office in absence of PCP and stated that she would just buy something OTC.

## 2021-03-01 NOTE — Telephone Encounter (Signed)
Nurse called asking if measurements were needed for compression socks  call back #[phone] Vernona Rieger from [facility]

## 2021-03-04 NOTE — Telephone Encounter (Signed)
I spoke with Natasha Harper with [facility] home health and she stated she called in this morning to schedule the pt for an appt due to lower leg edema. Pt has been scheduled to see Dr. Swaziland 08/30 to address this issue. Natasha Harper stated that the pt's condition has not changed since Friday with no weight gain or drainage from her legs.

## 2021-03-05 ENCOUNTER — Other Ambulatory Visit: Payer: Self-pay

## 2021-03-05 ENCOUNTER — Ambulatory Visit (INDEPENDENT_AMBULATORY_CARE_PROVIDER_SITE_OTHER): Payer: Medicare Other | Admitting: Internal Medicine

## 2021-03-05 ENCOUNTER — Encounter: Payer: Self-pay | Admitting: Internal Medicine

## 2021-03-05 ENCOUNTER — Ambulatory Visit: Payer: Medicare Other | Admitting: Family Medicine

## 2021-03-05 VITALS — BP 180/84 | HR 105 | Temp 98.7°F | Ht 61.0 in | Wt 111.4 lb

## 2021-03-05 DIAGNOSIS — Z09 Encounter for follow-up examination after completed treatment for conditions other than malignant neoplasm: Secondary | ICD-10-CM

## 2021-03-05 DIAGNOSIS — S32019D Unspecified fracture of first lumbar vertebra, subsequent encounter for fracture with routine healing: Secondary | ICD-10-CM | POA: Diagnosis not present

## 2021-03-05 DIAGNOSIS — S9031XD Contusion of right foot, subsequent encounter: Secondary | ICD-10-CM

## 2021-03-05 DIAGNOSIS — E876 Hypokalemia: Secondary | ICD-10-CM

## 2021-03-05 DIAGNOSIS — R609 Edema, unspecified: Secondary | ICD-10-CM | POA: Diagnosis not present

## 2021-03-05 DIAGNOSIS — R21 Rash and other nonspecific skin eruption: Secondary | ICD-10-CM | POA: Diagnosis not present

## 2021-03-05 DIAGNOSIS — M899 Disorder of bone, unspecified: Secondary | ICD-10-CM

## 2021-03-05 DIAGNOSIS — M810 Age-related osteoporosis without current pathological fracture: Secondary | ICD-10-CM

## 2021-03-05 DIAGNOSIS — I6529 Occlusion and stenosis of unspecified carotid artery: Secondary | ICD-10-CM

## 2021-03-05 DIAGNOSIS — M8000XD Age-related osteoporosis with current pathological fracture, unspecified site, subsequent encounter for fracture with routine healing: Secondary | ICD-10-CM

## 2021-03-05 DIAGNOSIS — S92512D Displaced fracture of proximal phalanx of left lesser toe(s), subsequent encounter for fracture with routine healing: Secondary | ICD-10-CM

## 2021-03-05 DIAGNOSIS — Z9181 History of falling: Secondary | ICD-10-CM

## 2021-03-05 DIAGNOSIS — Z79899 Other long term (current) drug therapy: Secondary | ICD-10-CM

## 2021-03-05 DIAGNOSIS — R030 Elevated blood-pressure reading, without diagnosis of hypertension: Secondary | ICD-10-CM

## 2021-03-05 DIAGNOSIS — E039 Hypothyroidism, unspecified: Secondary | ICD-10-CM

## 2021-03-05 DIAGNOSIS — M419 Scoliosis, unspecified: Secondary | ICD-10-CM

## 2021-03-05 DIAGNOSIS — M949 Disorder of cartilage, unspecified: Secondary | ICD-10-CM

## 2021-03-05 DIAGNOSIS — S22079D Unspecified fracture of T9-T10 vertebra, subsequent encounter for fracture with routine healing: Secondary | ICD-10-CM

## 2021-03-05 DIAGNOSIS — S32029D Unspecified fracture of second lumbar vertebra, subsequent encounter for fracture with routine healing: Secondary | ICD-10-CM | POA: Diagnosis not present

## 2021-03-05 DIAGNOSIS — N6019 Diffuse cystic mastopathy of unspecified breast: Secondary | ICD-10-CM

## 2021-03-05 DIAGNOSIS — S32000D Wedge compression fracture of unspecified lumbar vertebra, subsequent encounter for fracture with routine healing: Secondary | ICD-10-CM

## 2021-03-05 DIAGNOSIS — S32039D Unspecified fracture of third lumbar vertebra, subsequent encounter for fracture with routine healing: Secondary | ICD-10-CM | POA: Diagnosis not present

## 2021-03-05 DIAGNOSIS — I2510 Atherosclerotic heart disease of native coronary artery without angina pectoris: Secondary | ICD-10-CM

## 2021-03-05 DIAGNOSIS — Z7982 Long term (current) use of aspirin: Secondary | ICD-10-CM

## 2021-03-05 DIAGNOSIS — E7800 Pure hypercholesterolemia, unspecified: Secondary | ICD-10-CM

## 2021-03-05 DIAGNOSIS — I10 Essential (primary) hypertension: Secondary | ICD-10-CM

## 2021-03-05 DIAGNOSIS — D649 Anemia, unspecified: Secondary | ICD-10-CM

## 2021-03-05 DIAGNOSIS — S22068D Other fracture of T7-T8 thoracic vertebra, subsequent encounter for fracture with routine healing: Secondary | ICD-10-CM | POA: Diagnosis not present

## 2021-03-05 DIAGNOSIS — M4316 Spondylolisthesis, lumbar region: Secondary | ICD-10-CM

## 2021-03-05 MED ORDER — FUROSEMIDE 20 MG PO TABS: 20.0000 mg | ORAL_TABLET | Freq: Every day | ORAL | 0 refills | Status: DC

## 2021-03-05 MED ORDER — LIDOCAINE 5 % EX PTCH: 1.0000 | MEDICATED_PATCH | CUTANEOUS | 0 refills | Status: DC

## 2021-03-05 MED ORDER — KETOCONAZOLE 2 % EX CREA: 1.0000 "application " | TOPICAL_CREAM | Freq: Two times a day (BID) | CUTANEOUS | 0 refills | Status: DC

## 2021-03-05 NOTE — Progress Notes (Signed)
Chief Complaint  Patient presents with   Edema    Patient complains of Bi- lateral edema in lower extremities, x1 month,      HPI: Natasha Harper 86 y.o. come in fo spouse today for follow-up hospital medication evaluation of edema in feet. See hospital discharge admitted 8 2-8 7 with compression fractures of the lumbar and thoracic vertebra because of causing severe pain was felt to be a nonsurgical problem and she was discharged home although the discussion was to go to rehab. She did see Dr. Dutch Quint at follow-up Since then her pain persists but is okay if she is static bothers her when leaning against it or certain movements. Her appetite is down since hospitalization No vomiting diarrhea She had some swelling in her legs before she was discharged from the hospital. Blood pressure at home is 140 occasionally 150 range she has severe whitecoat hypertension.  Home health asked about compression stockings and meters She has been using lidocaine patches 5% and run out asked for refill.  It is quite helpful to put on 2 or 1-1/2 patches.  The back where she places it itches a good bit and she will scratch.  Denies pain in her skin is not using heat.    Admit date: 02/05/2021 Discharge date: 02/10/2021   Time spent: 60 minutes   Recommendations for Outpatient Follow-up:  Follow-up PCP in 2 weeks Follow-up neurosurgery in 1 weeks     Discharge Diagnoses:  Principal Problem:   Compression fracture of lumbar vertebra (HCC) Active Problems:   Dyslipidemia   Hypothyroidism   Back pain   Compression fracture of thoracic vertebra (HCC)  Spinal compression fracture, T8, T10, T11, L1, L2, L3 -Nonoperable as per neurosurgery -Recommends TLSO brace and outpatient follow-up with 2 weeks -Patient wants to avoid opioids for pain -Continue Tylenol as needed, ibuprofen as needed, lidocaine patch -PT eval obtained, patient to go home with home health PT   Left fifth proximal phalanx  fracture -Seen on x-ray of the foot -Discussed with orthopedist Dr. Roda Shutters, he recommends putting postop shoe for a month -No surgical intervention recommended     Hypothyroidism -Continue Synthroid   Hyperlipidemia -Continue statin   Cystic lesion in right pelvis -Incidental finding on MRI -Recommend follow-up ultrasound as outpatient   Multiple cystic lesions in right upper quadrant/pancreatic head -Incidental finding on MRI -LFTs are normal -Outpatient follow-up MRCP   Hypokalemia -replete   Hypertension -Blood pressure has been elevated  -Patient started on amlodipine 5 mg daily         ROS: See pertinent positives and negatives per HPI.  Denies current chest pain shortness of breath still has baseline hearing deficit no falling. 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  His PICC line was taken out in June she was caring for him at the time.  Past Medical History:  Diagnosis Date   CAD (coronary artery disease)    PCI to RCA and diagonal in remote past, residual 70% LAD  /   nuclear, 2007, no ischemia   Carotid arterial disease (HCC)    Doppler, June, 2011, stable, 60-79% R. ICA, 40-59% LICA   Contact lens/glasses fitting    wears contacts or glasses   Dyslipidemia    Significant drop in LDL from Lipitor even though LDL remains high   Ejection fraction    EF 65%, nuclear, 2007   Fibrocystic breast  HTN (hypertension)     no med   Hypercholesterolemia    Hypothyroidism    Thyroid surgery in the past, thyroid nodules followed by Dr.Ellison   Lump or mass in breast 07/19/2007   Excised 01/12/13. B9 on pathQualifier: Diagnosis of  By: Fabian Sharp MD, Neta Mends     Osteoporosis    Prominent abdominal aortic pulsation    No abdominal aneurysm by ultrasound   Rectal fissure     Family History  Problem Relation Age of Onset   Heart attack Father    Osteoporosis Mother    Breast  cancer Daughter     Social History   Socioeconomic History   Marital status: Married    Spouse name: Not on file   Number of children: Not on file   Years of education: Not on file   Highest education level: Not on file  Occupational History   Not on file  Tobacco Use   Smoking status: Former    Types: Cigarettes    Quit date: 07/07/1958    Years since quitting: 62.7   Smokeless tobacco: Never  Substance and Sexual Activity   Alcohol use: No   Drug use: No   Sexual activity: Yes    Partners: Male  Other Topics Concern   Not on file  Social History Narrative   HH     Of 2  No pets    Family from [place]    Exercise   Walks every day at least 30 minutes  2 miles .   Sleep  Ok 10 - 7    Just moved to [place] [facility]   Financial Resource Strain: Not on file  Food Insecurity: Not on file  Transportation Needs: Not on file  Physical Activity: Not on file  Stress: Not on file  Social Connections: Not on file    Outpatient Medications Prior to Visit  Medication Sig Dispense Refill   acetaminophen (TYLENOL) 500 MG tablet Take 1,000 mg by mouth every 6 (six) hours as needed for moderate pain.     amLODipine (NORVASC) 5 MG tablet Take 1 tablet (5 mg total) by mouth daily. 30 tablet 3   Ascorbic Acid (VITAMIN C) 100 MG tablet Take 100 mg by mouth daily.     aspirin 81 MG tablet Take 81 mg by mouth daily.     atorvastatin (LIPITOR) 80 MG tablet TAKE 1 TABLET BY MOUTH DAILY 90 tablet 3   baclofen (LIORESAL) 10 MG tablet Take 0.5-1 tablets (5-10 mg total) by mouth 3 (three) times daily as needed for muscle spasms. 30 each 3   cholecalciferol (VITAMIN D3) 25 MCG (1000 UNIT) tablet Take 1,000 Units by mouth daily.     fish oil-omega-3 fatty acids 1000 MG capsule Take 1,000 mg by mouth daily.     MULTIPLE VITAMIN PO Take 1 tablet by mouth daily.     SYNTHROID 50 MCG tablet TAKE 1 TABLET BY MOUTH DAILY 90 tablet 3   lidocaine (LIDODERM) 5 % Place 1  patch onto the skin daily. Remove & Discard patch within 12 hours or as directed by MD 30 patch 0   No facility-administered medications prior to visit.     EXAM:  BP (!) 180/84 (BP Location: Left Arm, Patient Position: Sitting, Cuff Size: Normal)   Pulse (!) 105   Temp 98.7 F (37.1 C) (Oral)   Ht 5\' 1"  (1.549 m)   Wt 111 lb 6.4 oz (50.5 kg)  LMP 07/07/1997   SpO2 95%   BMI 21.05 kg/m   Body mass index is 21.05 kg/m. States she is medium today. GENERAL: vitals reviewed and listed above, alert, oriented, appears well hydrated and in no acute distress HEENT: atraumatic, conjunctiva  clear, no obvious abnormalities on inspection of external nose and ears hard of hearing hearing aids OP : Masked NECK: no obvious masses on inspection palpation  LUNGS: clear to auscultation bilaterally, no wheezes, rales or rhonchi, good air movement kyphosis and deformity of spine. CV: HRRR, no clubbing cyanosis lower extremity +3 edema skin is tight but no weeping or ulcers. MS: moves all extremities has a roller walker. Skin back shows scattered bumps some excoriated throughout the middle of the back and upper and a few satellite bumps down on the posterior waist.  There are no pustules or vesicles or discharge.  She states this is where the patches. PSYCH: pleasant and cooperative, no obvious depression or anxiety Lab Results  Component Value Date   WBC 4.3 02/07/2021   HGB 11.6 (L) 02/07/2021   HCT 34.5 (L) 02/07/2021   PLT 263 02/07/2021   GLUCOSE 81 02/07/2021   CHOL 178 10/22/2020   TRIG 68.0 10/22/2020   HDL 57.50 10/22/2020   LDLDIRECT 152.1 01/16/2010   LDLCALC 107 (H) 10/22/2020   ALT 27 02/05/2021   AST 26 02/05/2021   NA 135 02/07/2021   K 4.6 02/07/2021   CL 102 02/07/2021   CREATININE 0.48 02/07/2021   BUN 23 02/07/2021   CO2 25 02/07/2021   TSH 0.91 10/22/2020   HGBA1C 5.6 11/25/2017   BP Readings from Last 3 Encounters:  03/05/21 (!) 180/84  02/10/21 (!) 145/67   10/22/20 (!) 160/80   No results found for: [id number]  ASSESSMENT AND PLAN:  Discussed the following assessment and plan:  Edema, unspecified type - Plan: Basic metabolic panel  Medication management - Plan: Basic metabolic panel, TSH  Disorder of bone and cartilage  Rash  Compression fracture of lumbar vertebra with routine healing, unspecified lumbar vertebral level, subsequent encounter - and thoracic  - Plan: VITAMIN D 25 Hydroxy (Vit-D Deficiency, Fractures)  HYPERTENSION, WHITE COAT - Plan: Basic metabolic panel  Hospital discharge follow-up  Hypothyroidism, unspecified type - Plan: TSH  Osteoporosis with current pathological fracture with routine healing, unspecified osteoporosis type, subsequent encounter - Plan: VITAMIN D 25 Hydroxy (Vit-D Deficiency, Fractures)  Anemia, unspecified type - Plan: Vitamin B12 bone health meds to consider in the future she had been reluctant in the past with some difficulties consider Prolia if appropriate and GFR is normal If blood pressure is above 150 in the 160 range notify for intervention. We can try Lasix 20 mg a day over the next few weeks to see effect and then check BMP to check potassium .  Uncertain how much amlodipine is contributing but probably not the main culprit.  Will ask home health to obtain compression knee-high's 20 3-  0 for her to use first thing in the morning  There are multiple loose ends and incidental findings from the hospitalization that we will need to address at her follow-up in 6 to 8 weeks.  This includes a cyst that may require an MRCP.  I do not think she would be able to hold still for that procedure at this time we will wait until her pain is improved. Will review record for plan Attention to skin care.  Continue home health.  I have concerned about the rash appears to  be discrete and excoriated from itching.  It does not fit the pattern only of where the patch is applied as a contact and.  I  suppose it could be bacterial skin infection like a folliculitis but would treat as yeast Candida rash.  Caution with patches and avoid scratching can use over-the-counter hydrocortisone if needed for itching.  If progressive or looks infected we will readdress.  -Patient advised to return or notify health care team  if  new concerns arise.  Patient Instructions  Will refill lidocaine patches but do not scratch to make rash   use cool compresses and   hydrocortisone 1 % OTC  if needed.  For itching  Also add anti yeast medication     for 1-2 weeks   there may be  yeast on top of    irritation rash.   Compression stockings order will be given to home health but  20 - 30 mm knee high should do  Elevated feet as possible.   Stockings  in am  and moisturizing to avoid skin cracking.   Plan lab in 2-3 weeks  to check potassium level on the fluid  pill.   Eventually may  consider medication for bone health osteoporosis.    Neta Mends. Achol Azpeitia M.D.

## 2021-03-05 NOTE — Telephone Encounter (Signed)
Pt has been scheduled for OV today at 3:00 with PCP.

## 2021-03-05 NOTE — Telephone Encounter (Signed)
So I have openings this afternoon at 3 pm today can she change the appointment from Dr. Swaziland to my schedule She already had an appointment next week. If she has the appointment with Dr. Swaziland today she will still have to come back on September 7 to see me. This is to enable signing all the home health records.

## 2021-03-05 NOTE — Patient Instructions (Addendum)
Will refill lidocaine patches but do not scratch to make rash   use cool compresses and   hydrocortisone 1 % OTC  if needed.  For itching  Also add anti yeast medication     for 1-2 weeks   there may be  yeast on top of    irritation rash.   Compression stockings order will be given to home health but  20 - 30 mm knee high should do  Elevated feet as possible.   Stockings  in am  and moisturizing to avoid skin cracking.   Plan lab in 2-3 weeks  to check potassium level on the fluid  pill.   Eventually may  consider medication for bone health osteoporosis.

## 2021-03-06 NOTE — Telephone Encounter (Signed)
-----   Message from [name], MD sent at 03/05/2021  6:17 PM EDT ----- Please have home health order  her compression knee highs ( her preference) 20 - 30 mm

## 2021-03-06 NOTE — Telephone Encounter (Signed)
I spoke with Harriett Sine from [facility] and those order have been given.

## 2021-03-08 ENCOUNTER — Telehealth: Payer: Self-pay | Admitting: Internal Medicine

## 2021-03-08 NOTE — Telephone Encounter (Signed)
Beth from [facility] call and stated she need a order to state that they don't have to call if pt bp is under 200.pt stated that pt said she don't take amlodipine.Beth's # is [phone].

## 2021-03-12 ENCOUNTER — Telehealth: Payer: Self-pay | Admitting: Internal Medicine

## 2021-03-12 MED ORDER — KETOCONAZOLE 2 % EX CREA: 1.0000 "application " | TOPICAL_CREAM | Freq: Two times a day (BID) | CUTANEOUS | 0 refills | Status: DC

## 2021-03-12 NOTE — Telephone Encounter (Signed)
Patient's husband called and asked if they could get a refill on ketoconazole (NIZORAL) 2 % cream  Husband states the prescription is originally no refill, but she still needs it.       Send to  [facility] [address], [place] - [address] Phone:  [phone]  Fax:  [phone]      Good callback number is [phone]

## 2021-03-12 NOTE — Telephone Encounter (Signed)
Rx sent to pt's pharmacy

## 2021-03-13 ENCOUNTER — Ambulatory Visit: Payer: Medicare Other | Admitting: Internal Medicine

## 2021-03-13 NOTE — Telephone Encounter (Signed)
Okay to change call order f that they do not need to call unless blood pressure 200 and over  In regard to amlodipine when she taking it when I saw her last. ? It was on her medication list.  Please confirm with patient that she never took the amlodipine.

## 2021-03-14 ENCOUNTER — Telehealth: Payer: Self-pay | Admitting: Internal Medicine

## 2021-03-14 DIAGNOSIS — S9031XD Contusion of right foot, subsequent encounter: Secondary | ICD-10-CM | POA: Diagnosis not present

## 2021-03-14 DIAGNOSIS — S32039D Unspecified fracture of third lumbar vertebra, subsequent encounter for fracture with routine healing: Secondary | ICD-10-CM | POA: Diagnosis not present

## 2021-03-14 DIAGNOSIS — M810 Age-related osteoporosis without current pathological fracture: Secondary | ICD-10-CM | POA: Diagnosis not present

## 2021-03-14 DIAGNOSIS — S32029D Unspecified fracture of second lumbar vertebra, subsequent encounter for fracture with routine healing: Secondary | ICD-10-CM | POA: Diagnosis not present

## 2021-03-14 DIAGNOSIS — S22079D Unspecified fracture of T9-T10 vertebra, subsequent encounter for fracture with routine healing: Secondary | ICD-10-CM | POA: Diagnosis not present

## 2021-03-14 DIAGNOSIS — M4316 Spondylolisthesis, lumbar region: Secondary | ICD-10-CM | POA: Diagnosis not present

## 2021-03-14 DIAGNOSIS — I2510 Atherosclerotic heart disease of native coronary artery without angina pectoris: Secondary | ICD-10-CM | POA: Diagnosis not present

## 2021-03-14 DIAGNOSIS — I6529 Occlusion and stenosis of unspecified carotid artery: Secondary | ICD-10-CM | POA: Diagnosis not present

## 2021-03-14 DIAGNOSIS — N6019 Diffuse cystic mastopathy of unspecified breast: Secondary | ICD-10-CM | POA: Diagnosis not present

## 2021-03-14 DIAGNOSIS — S22069D Unspecified fracture of T7-T8 vertebra, subsequent encounter for fracture with routine healing: Secondary | ICD-10-CM | POA: Diagnosis not present

## 2021-03-14 DIAGNOSIS — E7800 Pure hypercholesterolemia, unspecified: Secondary | ICD-10-CM | POA: Diagnosis not present

## 2021-03-14 DIAGNOSIS — I10 Essential (primary) hypertension: Secondary | ICD-10-CM | POA: Diagnosis not present

## 2021-03-14 DIAGNOSIS — S92512D Displaced fracture of proximal phalanx of left lesser toe(s), subsequent encounter for fracture with routine healing: Secondary | ICD-10-CM | POA: Diagnosis not present

## 2021-03-14 DIAGNOSIS — S32019D Unspecified fracture of first lumbar vertebra, subsequent encounter for fracture with routine healing: Secondary | ICD-10-CM | POA: Diagnosis not present

## 2021-03-14 DIAGNOSIS — E039 Hypothyroidism, unspecified: Secondary | ICD-10-CM | POA: Diagnosis not present

## 2021-03-14 DIAGNOSIS — M419 Scoliosis, unspecified: Secondary | ICD-10-CM | POA: Diagnosis not present

## 2021-03-14 NOTE — Telephone Encounter (Signed)
Beth from [facility] has been given call order in regard to the pt's BP.

## 2021-03-14 NOTE — Telephone Encounter (Signed)
Left a message for Beth from [facility] to return my call. Pt confirmed that she is not taking any BP medications.

## 2021-03-14 NOTE — Telephone Encounter (Signed)
Beth from [facility] called today wanting a message sent back to Dr. Fabian Sharp on behalf of the patient. Beth states that the patient was started on lasix last week and the patient took the pill for three days. She states the patient started having some diarrhea and nausea so she stopped the medication. The patient has been off the medication for three days, according to [facility] She says that the swelling has gotten better and that the patient has been wearing compression socks.   Beth just wanted this message to be passed along to Dr. Fabian Sharp so that she is aware.  Beth's contact number is [phone].

## 2021-03-16 NOTE — Telephone Encounter (Signed)
Ok   to stop the lsaix and use as needed . Do daily weights  ( in am )    . report if 3-4 pound weight gains in a few days and swelling increase and we can use the lasix as needed   at that time.

## 2021-03-18 NOTE — Telephone Encounter (Signed)
I spoke with Beth from [facility] and she has been informed of the message below and verbalized understanding. Beth stated that the pt does not like to take Lasix as she thinks the prescription is causing her problems. Beth stated she would call back in to the office if issues are ongoing.

## 2021-03-21 ENCOUNTER — Telehealth: Payer: Self-pay | Admitting: Internal Medicine

## 2021-03-21 NOTE — Telephone Encounter (Signed)
Beth from [facility] called to let you know that the patient is continuing to have mild edema in lower legs down to feet. Also, the patient is elavating and wearing the pressure socks as requested.  Beth could be contacted at [phone].  Please advise.

## 2021-03-29 ENCOUNTER — Telehealth: Payer: Self-pay | Admitting: Internal Medicine

## 2021-03-29 MED ORDER — KETOCONAZOLE 2 % EX CREA: 1.0000 "application " | TOPICAL_CREAM | Freq: Two times a day (BID) | CUTANEOUS | 0 refills | Status: DC

## 2021-03-29 NOTE — Telephone Encounter (Signed)
PT spouse called to request a refill of his wifes ketoconazole (NIZORAL) 2 % cream to be called into the [facility] pharmacy on file.

## 2021-03-29 NOTE — Addendum Note (Signed)
Addended by: Johnella Moloney on: 03/29/2021 11:09 AM   Modules accepted: Orders

## 2021-03-29 NOTE — Telephone Encounter (Signed)
Rx done. 

## 2021-04-13 ENCOUNTER — Other Ambulatory Visit: Payer: Self-pay

## 2021-04-13 ENCOUNTER — Inpatient Hospital Stay (HOSPITAL_BASED_OUTPATIENT_CLINIC_OR_DEPARTMENT_OTHER)
Admission: EM | Admit: 2021-04-13 | Discharge: 2021-04-15 | DRG: 552 | Disposition: A | Discharge status: Home Health | Payer: Medicare Other | Attending: Internal Medicine | Admitting: Internal Medicine

## 2021-04-13 ENCOUNTER — Encounter (HOSPITAL_BASED_OUTPATIENT_CLINIC_OR_DEPARTMENT_OTHER): Payer: Self-pay

## 2021-04-13 ENCOUNTER — Emergency Department (HOSPITAL_BASED_OUTPATIENT_CLINIC_OR_DEPARTMENT_OTHER): Payer: Medicare Other

## 2021-04-13 DIAGNOSIS — M4854XA Collapsed vertebra, not elsewhere classified, thoracic region, initial encounter for fracture: Secondary | ICD-10-CM | POA: Diagnosis present

## 2021-04-13 DIAGNOSIS — Z79890 Hormone replacement therapy (postmenopausal): Secondary | ICD-10-CM

## 2021-04-13 DIAGNOSIS — S3210XA Unspecified fracture of sacrum, initial encounter for closed fracture: Principal | ICD-10-CM | POA: Diagnosis present

## 2021-04-13 DIAGNOSIS — E039 Hypothyroidism, unspecified: Secondary | ICD-10-CM | POA: Diagnosis not present

## 2021-04-13 DIAGNOSIS — M810 Age-related osteoporosis without current pathological fracture: Secondary | ICD-10-CM | POA: Diagnosis present

## 2021-04-13 DIAGNOSIS — Z7982 Long term (current) use of aspirin: Secondary | ICD-10-CM

## 2021-04-13 DIAGNOSIS — Y92019 Unspecified place in single-family (private) house as the place of occurrence of the external cause: Secondary | ICD-10-CM

## 2021-04-13 DIAGNOSIS — Z79899 Other long term (current) drug therapy: Secondary | ICD-10-CM | POA: Diagnosis not present

## 2021-04-13 DIAGNOSIS — E860 Dehydration: Secondary | ICD-10-CM | POA: Diagnosis present

## 2021-04-13 DIAGNOSIS — Z8262 Family history of osteoporosis: Secondary | ICD-10-CM

## 2021-04-13 DIAGNOSIS — S32000A Wedge compression fracture of unspecified lumbar vertebra, initial encounter for closed fracture: Secondary | ICD-10-CM

## 2021-04-13 DIAGNOSIS — D7589 Other specified diseases of blood and blood-forming organs: Secondary | ICD-10-CM | POA: Diagnosis not present

## 2021-04-13 DIAGNOSIS — M546 Pain in thoracic spine: Secondary | ICD-10-CM | POA: Diagnosis not present

## 2021-04-13 DIAGNOSIS — W19XXXA Unspecified fall, initial encounter: Secondary | ICD-10-CM | POA: Diagnosis present

## 2021-04-13 DIAGNOSIS — E785 Hyperlipidemia, unspecified: Secondary | ICD-10-CM | POA: Diagnosis present

## 2021-04-13 DIAGNOSIS — Z8249 Family history of ischemic heart disease and other diseases of the circulatory system: Secondary | ICD-10-CM

## 2021-04-13 DIAGNOSIS — M8008XA Age-related osteoporosis with current pathological fracture, vertebra(e), initial encounter for fracture: Secondary | ICD-10-CM | POA: Diagnosis present

## 2021-04-13 DIAGNOSIS — Z9181 History of falling: Secondary | ICD-10-CM

## 2021-04-13 DIAGNOSIS — D649 Anemia, unspecified: Secondary | ICD-10-CM | POA: Diagnosis present

## 2021-04-13 DIAGNOSIS — Z23 Encounter for immunization: Secondary | ICD-10-CM | POA: Diagnosis not present

## 2021-04-13 DIAGNOSIS — M549 Dorsalgia, unspecified: Secondary | ICD-10-CM | POA: Diagnosis not present

## 2021-04-13 DIAGNOSIS — E871 Hypo-osmolality and hyponatremia: Secondary | ICD-10-CM | POA: Diagnosis not present

## 2021-04-13 DIAGNOSIS — Z20822 Contact with and (suspected) exposure to covid-19: Secondary | ICD-10-CM | POA: Diagnosis not present

## 2021-04-13 DIAGNOSIS — I2510 Atherosclerotic heart disease of native coronary artery without angina pectoris: Secondary | ICD-10-CM | POA: Diagnosis not present

## 2021-04-13 DIAGNOSIS — S22000A Wedge compression fracture of unspecified thoracic vertebra, initial encounter for closed fracture: Secondary | ICD-10-CM | POA: Diagnosis present

## 2021-04-13 DIAGNOSIS — E876 Hypokalemia: Secondary | ICD-10-CM | POA: Diagnosis present

## 2021-04-13 DIAGNOSIS — Z888 Allergy status to other drugs, medicaments and biological substances status: Secondary | ICD-10-CM

## 2021-04-13 DIAGNOSIS — E7800 Pure hypercholesterolemia, unspecified: Secondary | ICD-10-CM | POA: Diagnosis present

## 2021-04-13 DIAGNOSIS — M4856XA Collapsed vertebra, not elsewhere classified, lumbar region, initial encounter for fracture: Secondary | ICD-10-CM | POA: Diagnosis present

## 2021-04-13 DIAGNOSIS — Z803 Family history of malignant neoplasm of breast: Secondary | ICD-10-CM | POA: Diagnosis not present

## 2021-04-13 DIAGNOSIS — Y939 Activity, unspecified: Secondary | ICD-10-CM | POA: Insufficient documentation

## 2021-04-13 DIAGNOSIS — I10 Essential (primary) hypertension: Secondary | ICD-10-CM | POA: Diagnosis present

## 2021-04-13 DIAGNOSIS — M545 Low back pain, unspecified: Secondary | ICD-10-CM | POA: Diagnosis not present

## 2021-04-13 DIAGNOSIS — Z87891 Personal history of nicotine dependence: Secondary | ICD-10-CM | POA: Diagnosis not present

## 2021-04-13 DIAGNOSIS — S32010A Wedge compression fracture of first lumbar vertebra, initial encounter for closed fracture: Secondary | ICD-10-CM | POA: Diagnosis not present

## 2021-04-13 DIAGNOSIS — Y929 Unspecified place or not applicable: Secondary | ICD-10-CM | POA: Insufficient documentation

## 2021-04-13 DIAGNOSIS — D638 Anemia in other chronic diseases classified elsewhere: Secondary | ICD-10-CM | POA: Diagnosis present

## 2021-04-13 HISTORY — DX: Unspecified fracture of sacrum, initial encounter for closed fracture: S32.10XA

## 2021-04-13 HISTORY — DX: Wedge compression fracture of unspecified thoracic vertebra, initial encounter for closed fracture: S22.000A

## 2021-04-13 LAB — CBC WITH DIFFERENTIAL/PLATELET
Abs Immature Granulocytes: 0.02 10*3/uL (ref 0.00–0.07)
Basophils Absolute: 0 10*3/uL (ref 0.0–0.1)
Basophils Relative: 0 %
Eosinophils Absolute: 0 10*3/uL (ref 0.0–0.5)
Eosinophils Relative: 0 %
HCT: 31.8 % — ABNORMAL LOW (ref 36.0–46.0)
Hemoglobin: 10.6 g/dL — ABNORMAL LOW (ref 12.0–15.0)
Immature Granulocytes: 0 %
Lymphocytes Relative: 9 %
Lymphs Abs: 0.6 10*3/uL — ABNORMAL LOW (ref 0.7–4.0)
MCH: 35.5 pg — ABNORMAL HIGH (ref 26.0–34.0)
MCHC: 33.3 g/dL (ref 30.0–36.0)
MCV: 106.4 fL — ABNORMAL HIGH (ref 80.0–100.0)
Monocytes Absolute: 0.5 10*3/uL (ref 0.1–1.0)
Monocytes Relative: 9 %
Neutro Abs: 5 10*3/uL (ref 1.7–7.7)
Neutrophils Relative %: 82 %
Platelets: 378 10*3/uL (ref 150–400)
RBC: 2.99 MIL/uL — ABNORMAL LOW (ref 3.87–5.11)
RDW: 14.7 % (ref 11.5–15.5)
WBC: 6.2 10*3/uL (ref 4.0–10.5)
nRBC: 0 % (ref 0.0–0.2)

## 2021-04-13 LAB — COMPREHENSIVE METABOLIC PANEL
ALT: 71 U/L — ABNORMAL HIGH (ref 0–44)
AST: 60 U/L — ABNORMAL HIGH (ref 15–41)
Albumin: 3.5 g/dL (ref 3.5–5.0)
Alkaline Phosphatase: 636 U/L — ABNORMAL HIGH (ref 38–126)
Anion gap: 10 (ref 5–15)
BUN: 10 mg/dL (ref 8–23)
CO2: 26 mmol/L (ref 22–32)
Calcium: 9.5 mg/dL (ref 8.9–10.3)
Chloride: 97 mmol/L — ABNORMAL LOW (ref 98–111)
Creatinine, Ser: 0.45 mg/dL (ref 0.44–1.00)
GFR, Estimated: >60 mL/min (ref >60)
Glucose, Bld: 109 mg/dL — ABNORMAL HIGH (ref 70–99)
Potassium: 3.3 mmol/L — ABNORMAL LOW (ref 3.5–5.1)
Sodium: 133 mmol/L — ABNORMAL LOW (ref 135–145)
Total Bilirubin: 0.6 mg/dL (ref 0.3–1.2)
Total Protein: 6.8 g/dL (ref 6.5–8.1)

## 2021-04-13 LAB — RESP PANEL BY RT-PCR (FLU A&B, COVID) ARPGX2
Influenza A by PCR: NEGATIVE
Influenza B by PCR: NEGATIVE
SARS Coronavirus 2 by RT PCR: NEGATIVE

## 2021-04-13 MED ORDER — KETOROLAC TROMETHAMINE 15 MG/ML IJ SOLN
15.0000 mg | Freq: Four times a day (QID) | INTRAMUSCULAR | Status: DC | PRN
  Administered 2021-04-14: 15 mg via INTRAVENOUS
  Filled 2021-04-13: qty 1

## 2021-04-13 MED ORDER — FENTANYL CITRATE PF 50 MCG/ML IJ SOSY
50.0000 ug | PREFILLED_SYRINGE | Freq: Once | INTRAMUSCULAR | Status: DC
Start: 2021-04-13 — End: 2021-04-13

## 2021-04-13 MED ORDER — DIAZEPAM 5 MG/ML IJ SOLN
2.0000 mg | Freq: Once | INTRAMUSCULAR | Status: AC
  Administered 2021-04-13: 2 mg via INTRAVENOUS
  Filled 2021-04-13: qty 2

## 2021-04-13 MED ORDER — ACETAMINOPHEN 325 MG PO TABS
650.0000 mg | ORAL_TABLET | Freq: Four times a day (QID) | ORAL | Status: DC | PRN
  Administered 2021-04-14 – 2021-04-15 (×2): 650 mg via ORAL
  Filled 2021-04-13 (×2): qty 2

## 2021-04-13 MED ORDER — KETOROLAC TROMETHAMINE 30 MG/ML IJ SOLN
15.0000 mg | Freq: Once | INTRAMUSCULAR | Status: AC
  Administered 2021-04-13: 15 mg via INTRAVENOUS
  Filled 2021-04-13: qty 1

## 2021-04-13 MED ORDER — ACETAMINOPHEN 650 MG RE SUPP: 650.0000 mg | Freq: Four times a day (QID) | RECTAL | Status: DC | PRN

## 2021-04-13 MED ORDER — ACETAMINOPHEN 325 MG PO TABS
650.0000 mg | ORAL_TABLET | Freq: Once | ORAL | Status: AC
  Administered 2021-04-13: 650 mg via ORAL
  Filled 2021-04-13: qty 2

## 2021-04-13 MED ORDER — ONDANSETRON HCL 4 MG/2ML IJ SOLN: 4.0000 mg | Freq: Four times a day (QID) | INTRAMUSCULAR | Status: DC | PRN

## 2021-04-13 MED ORDER — SODIUM CHLORIDE 0.9 % IV SOLN: INTRAVENOUS | Status: DC

## 2021-04-13 MED ORDER — INFLUENZA VAC A&B SA ADJ QUAD 0.5 ML IM PRSY
0.5000 mL | PREFILLED_SYRINGE | INTRAMUSCULAR | Status: AC
  Administered 2021-04-15: 0.5 mL via INTRAMUSCULAR
  Filled 2021-04-13: qty 0.5

## 2021-04-13 MED ORDER — ONDANSETRON HCL 4 MG PO TABS: 4.0000 mg | ORAL_TABLET | Freq: Four times a day (QID) | ORAL | Status: DC | PRN

## 2021-04-13 MED ORDER — LIDOCAINE 5 % EX PTCH
1.0000 | MEDICATED_PATCH | CUTANEOUS | Status: DC
  Administered 2021-04-13 – 2021-04-14 (×2): 1 via TRANSDERMAL
  Filled 2021-04-13 (×3): qty 1

## 2021-04-13 MED ORDER — ENOXAPARIN SODIUM 40 MG/0.4ML IJ SOSY
40.0000 mg | PREFILLED_SYRINGE | INTRAMUSCULAR | Status: DC
  Administered 2021-04-13 – 2021-04-14 (×2): 40 mg via SUBCUTANEOUS
  Filled 2021-04-13 (×2): qty 0.4

## 2021-04-13 NOTE — Plan of Care (Signed)
Plan of care discussed.   

## 2021-04-13 NOTE — ED Triage Notes (Signed)
She tells [place] she "tripped" and fell on her bottom at home. She c/o coccyx area pain. She was ambulatory with 2 assist and is in no distress. Her husband is with her.

## 2021-04-13 NOTE — ED Notes (Signed)
RT note: Pt. placed on 2 lpm n/c oxygen after given medicine for pain per RN, RT to monitor.

## 2021-04-13 NOTE — Progress Notes (Signed)
Orthopedic Tech Progress Note Patient Details:  Natasha Harper 12/20/1934 [id number]  Patient ID: Natasha Harper, female   DOB: [birth year], 86 y.o.   MRN: [id number]  Natasha Harper 04/13/2021, 8:25 PMCalled and routed TLSO order to [facility]

## 2021-04-13 NOTE — ED Notes (Signed)
Pt placed on 2L o2 d/t o2 92 % after receiving diazepam medication

## 2021-04-13 NOTE — ED Provider Notes (Signed)
[facility] EMERGENCY DEPT Provider Note   CSN: [id number] Arrival date & time: 04/13/21  1514     History Chief Complaint  Patient presents with   Marletta Lor    Natasha Harper is a 86 y.o. female.  Patient with history of compression fractures with mechanical fall last night.  Having pain in her upper and lower back.  Did not hit her head or lose consciousness.  Not on blood thinners.  Ambulates typically with a walker but having increased pain today.  Denies any numbness or tingling in her legs.  No loss of bowel or bladder.  The history is provided by the patient.  Fall This is a new problem. Episode onset: yesterday. The problem occurs constantly. The problem has not changed since onset.Pertinent negatives include no chest pain, no abdominal pain, no headaches and no shortness of breath. The symptoms are aggravated by walking. Nothing relieves the symptoms. She has tried nothing for the symptoms. The treatment provided no relief.      Past Medical History:  Diagnosis Date   CAD (coronary artery disease)    PCI to RCA and diagonal in remote past, residual 70% LAD  /   nuclear, 2007, no ischemia   Carotid arterial disease (HCC)    Doppler, June, 2011, stable, 60-79% R. ICA, 40-59% LICA   Contact lens/glasses fitting    wears contacts or glasses   Dyslipidemia    Significant drop in LDL from Lipitor even though LDL remains high   Ejection fraction    EF 65%, nuclear, 2007   Fibrocystic breast    HTN (hypertension)     no med   Hypercholesterolemia    Hypothyroidism    Thyroid surgery in the past, thyroid nodules followed by Natasha Harper   Lump or mass in breast 07/19/2007   Excised 01/12/13. B9 on pathQualifier: Diagnosis of  By: Natasha Sharp MD, Natasha Harper     Osteoporosis    Prominent abdominal aortic pulsation    No abdominal aneurysm by ultrasound   Rectal fissure     Patient Active Problem List   Diagnosis Date Noted   Compression fracture of thoracic vertebra  (HCC) 02/06/2021   Compression fracture of lumbar vertebra (HCC) 02/06/2021   Back pain 02/05/2021   Medicare annual wellness visit, subsequent 12/20/2014   Decreased hearing 10/05/2013   Dyslipidemia    Carotid arterial disease (HCC)    Hypothyroidism    Ejection fraction    Visit for preventive health examination 06/02/2011   Medication side effect 06/02/2011   Disorder of bone and cartilage 04/07/2008   HYPERTENSION, WHITE COAT 04/07/2008   HYPERGLYCEMIA, FASTING 07/21/2007   THYROID NODULE 04/05/2007    Past Surgical History:  Procedure Laterality Date   BREAST BIOPSY Right 01/12/2013   Procedure: Removal of right breast mass;  Surgeon: Natasha Paris, MD;  Location: [facility];  Service: General;  Laterality: Right;   BREAST EXCISIONAL BIOPSY  9/14   scar tissue from the needle biopsy   CORONARY ANGIOPLASTY  1993   RECTOPERITONEAL FISTULA CLOSURE     THYROID CYST EXCISION       OB History     Gravida  3   Para  3   Term      Preterm      AB      Living  3      SAB      IAB      Ectopic      Multiple  Live Births              Family History  Problem Relation Age of Onset   Heart attack Father    Osteoporosis Mother    Breast cancer Daughter     Social History   Tobacco Use   Smoking status: Former    Types: Cigarettes    Quit date: 07/07/1958    Years since quitting: 62.8   Smokeless tobacco: Never  Substance Use Topics   Alcohol use: No   Drug use: No    Home Medications Prior to Admission medications   Medication Sig Start Date End Date Taking? Authorizing Provider  acetaminophen (TYLENOL) 500 MG tablet Take 1,000 mg by mouth every 6 (six) hours as needed for moderate pain.    [provider]  amLODipine (NORVASC) 5 MG tablet Take 1 tablet (5 mg total) by mouth daily. 02/10/21   Natasha Ide, MD  Ascorbic Acid (VITAMIN C) 100 MG tablet Take 100 mg by mouth daily.    [provider]  aspirin  81 MG tablet Take 81 mg by mouth daily.    [provider]  atorvastatin (LIPITOR) 80 MG tablet TAKE 1 TABLET BY MOUTH DAILY 10/30/20   Natasha Harper, Natasha Mends, MD  baclofen (LIORESAL) 10 MG tablet Take 0.5-1 tablets (5-10 mg total) by mouth 3 (three) times daily as needed for muscle spasms. 01/28/21   Natasha Harper, Natasha Needle, MD  cholecalciferol (VITAMIN D3) 25 MCG (1000 UNIT) tablet Take 1,000 Units by mouth daily.    [provider]  fish oil-omega-3 fatty acids 1000 MG capsule Take 1,000 mg by mouth daily.    [provider]  furosemide (LASIX) 20 MG tablet Take 1 tablet (20 mg total) by mouth daily. For 2 weeks and then as directed . 03/05/21   Natasha Harper, Natasha Mends, MD  ketoconazole (NIZORAL) 2 % cream Apply 1 application topically 2 (two) times daily. To rash 03/29/21   Natasha Harper, Natasha Mends, MD  lidocaine (LIDODERM) 5 % Place 1 patch onto the skin daily. Remove & Discard patch within 12 hours or as directed by MD 03/05/21   Natasha Harper, Natasha Mends, MD  MULTIPLE VITAMIN PO Take 1 tablet by mouth daily.    [provider]  SYNTHROID 50 MCG tablet TAKE 1 TABLET BY MOUTH DAILY 10/30/20   Natasha Harper, Natasha Mends, MD    Allergies    Actonel [risedronate sodium], Evista [raloxifene], and Fosamax [alendronate sodium]  Review of Systems   Review of Systems  Constitutional:  Negative for chills and fever.  HENT:  Negative for ear pain and sore throat.   Eyes:  Negative for pain and visual disturbance.  Respiratory:  Negative for cough and shortness of breath.   Cardiovascular:  Negative for chest pain and palpitations.  Gastrointestinal:  Negative for abdominal pain and vomiting.  Genitourinary:  Negative for dysuria and hematuria.  Musculoskeletal:  Positive for back pain and gait problem. Negative for arthralgias.  Skin:  Negative for color change and rash.  Neurological:  Negative for seizures, syncope, weakness, numbness and headaches.  All other systems reviewed and are negative.  Physical  Exam Updated Vital Signs BP (!) 170/83   Pulse 81   Temp 97.8 F (36.6 C)   Resp 18   LMP 07/07/1997   SpO2 100%   Physical Exam Vitals and nursing note reviewed.  Constitutional:      General: She is not in acute distress.    Appearance: She is well-developed.  HENT:  Head: Normocephalic and atraumatic.  Eyes:     Conjunctiva/sclera: Conjunctivae normal.  Neck:     Comments: No midline spinal tenderness in the cervical spine Cardiovascular:     Rate and Rhythm: Normal rate and regular rhythm.     Heart sounds: No murmur heard. Pulmonary:     Effort: Pulmonary effort is normal. No respiratory distress.     Breath sounds: Normal breath sounds.  Abdominal:     General: Abdomen is flat. There is no distension.     Palpations: Abdomen is soft.     Tenderness: There is no abdominal tenderness.  Musculoskeletal:        General: Tenderness present.     Cervical back: Normal range of motion and neck supple. No tenderness.     Comments: Tenderness throughout paraspinal muscles bilaterally in the thoracic and lumbar spine with midline spinal tenderness throughout but no obvious step-offs  Skin:    General: Skin is warm and dry.  Neurological:     General: No focal deficit present.     Mental Status: She is alert and oriented to person, place, and time.     Cranial Nerves: No cranial nerve deficit.     Sensory: No sensory deficit.     Motor: No weakness.     Coordination: Coordination normal.    ED Results / Procedures / Treatments   Labs (all labs ordered are listed, but only abnormal results are displayed) Labs Reviewed  CBC WITH DIFFERENTIAL/PLATELET - Abnormal; Notable for the following components:      Result Value   RBC 2.99 (*)    Hemoglobin 10.6 (*)    HCT 31.8 (*)    MCV 106.4 (*)    MCH 35.5 (*)    Lymphs Abs 0.6 (*)    All other components within normal limits  COMPREHENSIVE METABOLIC PANEL - Abnormal; Notable for the following components:   Sodium 133  (*)    Potassium 3.3 (*)    Chloride 97 (*)    Glucose, Bld 109 (*)    AST 60 (*)    ALT 71 (*)    Alkaline Phosphatase 636 (*)    All other components within normal limits  RESP PANEL BY RT-PCR (FLU A&B, COVID) [id number]    EKG None  Radiology CT Thoracic Spine Wo Contrast  Result Date: 04/13/2021 CLINICAL DATA:  Provided history: Pain. Additional history provided: Patient reports trip and fall this morning. Pain in area of coccyx. EXAM: CT THORACIC SPINE WITHOUT CONTRAST TECHNIQUE: Multidetector CT images of the thoracic were obtained using the standard protocol without intravenous contrast. COMPARISON:  CT of the thoracic spine 02/05/2021. FINDINGS: Alignment: Thoracic dextrocurvature. Exaggerated thoracic kyphosis. No significant spondylolisthesis or bony retropulsion. Vertebrae: Unchanged mild T3 superior endplate compression deformity. Height loss at site of a T5 superior endplate compression fracture has slightly progressed as compared to the CT thoracic spine of 02/05/2021, now 20%. Interval T6 vertebral compression fracture (30% height loss). Progressive height loss at site of a T7 superior endplate vertebral compression fracture (now 30%). Unchanged severe T8 vertebral compression fracture. Progressive height loss at site of a T9 vertebral compression fracture (now approximately 70%). Interval height loss at site of a T10 vertebral compression fracture (now approximately 70%). Redemonstrated severe T11 vertebral compression fracture. Interval T12 compression fracture (20% height loss). Paraspinal and other soft tissues: Aortic atherosclerosis. No consolidation within the imaged lungs. Disc levels: Thoracic spondylosis without appreciable high-grade spinal canal stenosis. No compressive bony neural foraminal narrowing. IMPRESSION:  1. Comparison is made to the prior thoracic spine CT of 02/05/2021. 2. Unchanged mild T3 superior endplate vertebral compression deformity. 3. Height loss at site of  a T5 superior endplate vertebral compression fracture has progressed (now 20%). 4. Interval T6 vertebral compression fracture (30% height loss). 5. Progressive height loss at site of a T7 superior endplate vertebral compression fracture (now 30%). 6. Unchanged severe T8 vertebral compression fracture. 7. Progressive height loss at site of a T9 vertebral compression fracture (now approximately 70%). 8. Interval height loss at site of a T10 vertebral compression fracture (now approximately 70%). 9. Unchanged severe T11 vertebral compression fracture. 10. Interval T12 compression fracture (20% height loss). 11. No significant bony retropulsion. 12. Thoracic spondylosis, without appreciable high-grade spinal canal stenosis. No compressive bony neural foraminal narrowing. 13. Exaggerated thoracic kyphosis. 14. Thoracic levocurvature. Electronically Signed   By: Jackey Loge D.O.   On: 04/13/2021 18:32   CT Lumbar Spine Wo Contrast  Result Date: 04/13/2021 CLINICAL DATA:  Pain. Additional history provided: Trip and fall today. Patient reports coccyx pain. EXAM: CT LUMBAR SPINE WITHOUT CONTRAST TECHNIQUE: Multidetector CT imaging of the lumbar spine was performed without intravenous contrast administration. Multiplanar CT image reconstructions were also generated. COMPARISON:  CT of the lumbar spine 02/05/2021. FINDINGS: Segmentation: 5 lumbar vertebrae. The caudal most well-formed intervertebral disc space designated L5-S1. Alignment: Trace bony retropulsion at the level of the L1 superior endplate mild thoracic dextrocurvature. 3 mm L4-L5 grade 1 anterolisthesis. Vertebrae: Unchanged L1 vertebral compression fracture (50% height loss). Unchanged trace bony retropulsion at the level of the L1 superior endplate. Unchanged subtle L2 superior endplate vertebral compression deformity. Slight progressive height loss at site of a mild L3 vertebral compression fracture. New L4 compression deformity (20% height loss). Mild L5  superior endplate compression deformity with possible subtle progressive height loss (now 20-30%). New mildly displaced acute fracture through the sacrum at the S1-S2 junction. Subtle kyphotic angulation at the fracture site. Please note the coccyx is not included in the field of view. Paraspinal and other soft tissues: Aortic atherosclerosis. Colonic diverticulosis. Paraspinal soft tissues unremarkable. Disc levels: No more than mild disc space narrowing at any level. Lumbar spondylosis, most notably as follows. At L4-L5, there is 3 mm grade 1 anterolisthesis. Disc uncovering with disc bulge. Facet arthrosis and ligamentum flavum hypertrophy. Bilateral subarticular stenosis with moderate central canal stenosis. Moderate bilateral neural foraminal narrowing. No appreciable significant central canal stenosis at the remaining levels. IMPRESSION: 1. Comparison is made to the prior lumbar spine CT of 02/05/2021. 2. Unchanged L1 vertebral compression fracture (50% height loss) with unchanged trace bony retropulsion at the level of the L1 superior endplate. 3. Unchanged subtle L2 superior endplate vertebral compression fracture. 4. Slight progressive height loss at site of a mild L3 vertebral compression fracture. 5. New L4 vertebral compression fracture (20% height loss). 6. Mild L5 superior endplate vertebral compression fracture with likely subtle progressive height loss (now 20-30%). 7. New mildly displaced acute fracture through the sacrum at the S1-S2 junction. Subtle kyphotic angulation at the fracture site. Please note the coccyx is not included in the field of view. 8. Lumbar spondylosis. Most notably at L4-L5, there is 3 mm grade 1 anterolisthesis. Disc uncovering with disc bulge. Facet arthrosis and ligamentum flavum hypertrophy. Bilateral subarticular stenosis with moderate central canal stenosis. Moderate bilateral neural foraminal narrowing. 9. Lumbar dextrocurvature. 10.  Aortic Atherosclerosis  (ICD10-I70.0). Electronically Signed   By: Jackey Loge D.O.   On: 04/13/2021 18:46  Procedures Procedures   Medications Ordered in ED Medications  lidocaine (LIDODERM) 5 % 1 patch (has no administration in time range)  diazepam (VALIUM) injection 2 mg (2 mg Intravenous Given 04/13/21 1706)  acetaminophen (TYLENOL) tablet 650 mg (650 mg Oral Given 04/13/21 1706)    ED Course  I have reviewed the triage vital signs and the nursing notes.  Pertinent labs & imaging results that were available during my care of the patient were reviewed by me and considered in my medical decision making (see chart for details).    MDM Rules/Calculators/A&P                           Natasha Harper is here with back pain after mechanical fall last night.  Patient has been in too much pain to move since fall last night.  Has basically just laid in bed.  Family brought patient in tonight.  She is having pain all throughout her back.  Has some midline tenderness throughout.  No symptoms to suggest cauda equina.  She is on a blood thinner.  She did not hit her head or lose consciousness.  She has no hip pain or extremity pain.  CT scan was obtained of her lumbar and thoracic spine which showed multiple new and progression of old fractures.  Seems like she has new T6 fracture and new L4 fracture as well as sacral fracture.  She does not want narcotic pain medicine therefore she was given IV Valium and Tylenol.  She is not able to ambulate with her walker and she is in too much discomfort.  We will talk with neurosurgery and admit to medicine for pain control and PT and OT and likely placement.  This chart was dictated using voice recognition software.  Despite best efforts to proofread,  errors can occur which can change the documentation meaning.   Final Clinical Impression(s) / ED Diagnoses Final diagnoses:  Compression fracture of body of thoracic vertebra (HCC)  Compression fracture of lumbar vertebra,  unspecified lumbar vertebral level, initial encounter (HCC)  Closed fracture of sacrum, unspecified portion of sacrum, initial encounter [facility])    Rx / DC Orders ED Discharge Orders     None        Virgina Norfolk, DO 04/13/21 1939

## 2021-04-13 NOTE — H&P (Signed)
History and Physical   Natasha Harper:[id number] DOB: 25-Dec-1934 DOA: 04/13/2021  Referring MD/NP/PA: Dr Virgina Norfolk  PCP: [name], MD   Outpatient Specialists: Dr Prince Rome, Orthopedic   Patient coming from: Home  Chief Complaint: Fall  HPI: Natasha Harper is a 86 y.o. female with medical history significant of coronary artery disease, hyperlipidemia, basal dysfunction CHF, essential hypertension, hyperlipidemia, hypothyroidism who sustained a mechanical fall last night.  Patient sustained pain in her upper and lower back.  She did not hit her head and did not lose consciousness.  Patient is not on any blood thinners also.  She typically uses a walker to move around but the pain is worse today.  No numbness or tingling in her legs no loss of bladder or bladder function.  Patient has had previous compression fractures.  She was seen in the emergency room unable to navigate due to severe pain.  She lives alone.  She has no symptoms to suggest cauda equina.  CT scan of the lumbar and thoracic spine showed multiple new and progression of old fractures.  There is questionable T6 and new L4 fracture there is also sacral fracture.  Patient being treated conservatively with mild pain management as well as a brace.  She has been admitted for pain management.  ED Course: Temperature 97.8 blood pressure 156/92, pulse 96 respiratory 18 oxygen sat 92% on room air.Sodium 133 potassium 3.3 chloride 97 CO2 26 and glucose 109.  White count is 6.2 hemoglobin 10.6 and platelet 378.  COVID-19 and influenza negative.  CT lumbar spine thoracic spine with multiple compression fractures.  Patient being admitted for pain management  Review of Systems: As per HPI otherwise 10 point review of systems negative.    Past Medical History:  Diagnosis Date   CAD (coronary artery disease)    PCI to RCA and diagonal in remote past, residual 70% LAD  /   nuclear, 2007, no ischemia   Carotid arterial disease (HCC)     Doppler, June, 2011, stable, 60-79% R. ICA, 40-59% LICA   Contact lens/glasses fitting    wears contacts or glasses   Dyslipidemia    Significant drop in LDL from Lipitor even though LDL remains high   Ejection fraction    EF 65%, nuclear, 2007   Fibrocystic breast    HTN (hypertension)     no med   Hypercholesterolemia    Hypothyroidism    Thyroid surgery in the past, thyroid nodules followed by Dr.Ellison   Lump or mass in breast 07/19/2007   Excised 01/12/13. B9 on pathQualifier: Diagnosis of  By: Fabian Sharp MD, Neta Mends     Osteoporosis    Prominent abdominal aortic pulsation    No abdominal aneurysm by ultrasound   Rectal fissure     Past Surgical History:  Procedure Laterality Date   BREAST BIOPSY Right 01/12/2013   Procedure: Removal of right breast mass;  Surgeon: Currie Paris, MD;  Location:  SURGERY CENTER;  Service: General;  Laterality: Right;   BREAST EXCISIONAL BIOPSY  9/14   scar tissue from the needle biopsy   CORONARY ANGIOPLASTY  1993   RECTOPERITONEAL FISTULA CLOSURE     THYROID CYST EXCISION       reports that she quit smoking about 62 years ago. She has never used smokeless tobacco. She reports that she does not drink alcohol and does not use drugs.  Allergies  Allergen Reactions   Actonel [Risedronate Sodium]  bloating   Evista [Raloxifene]     Abdominal cramps   Fosamax [Alendronate Sodium]     Abdominal cramps    Family History  Problem Relation Age of Onset   Heart attack Father    Osteoporosis Mother    Breast cancer Daughter      Prior to Admission medications   Medication Sig Start Date End Date Taking? Authorizing Provider  acetaminophen (TYLENOL) 500 MG tablet Take 1,000 mg by mouth every 6 (six) hours as needed for moderate pain.    [provider]  amLODipine (NORVASC) 5 MG tablet Take 1 tablet (5 mg total) by mouth daily. 02/10/21   Meredeth Ide, MD  Ascorbic Acid (VITAMIN C) 100 MG tablet Take 100 mg by  mouth daily.    [provider]  aspirin 81 MG tablet Take 81 mg by mouth daily.    [provider]  atorvastatin (LIPITOR) 80 MG tablet TAKE 1 TABLET BY MOUTH DAILY 10/30/20   Panosh, Neta Mends, MD  baclofen (LIORESAL) 10 MG tablet Take 0.5-1 tablets (5-10 mg total) by mouth 3 (three) times daily as needed for muscle spasms. 01/28/21   Hilts, Casimiro Needle, MD  cholecalciferol (VITAMIN D3) 25 MCG (1000 UNIT) tablet Take 1,000 Units by mouth daily.    [provider]  fish oil-omega-3 fatty acids 1000 MG capsule Take 1,000 mg by mouth daily.    [provider]  furosemide (LASIX) 20 MG tablet Take 1 tablet (20 mg total) by mouth daily. For 2 weeks and then as directed . 03/05/21   Panosh, Neta Mends, MD  ketoconazole (NIZORAL) 2 % cream Apply 1 application topically 2 (two) times daily. To rash 03/29/21   Panosh, Neta Mends, MD  lidocaine (LIDODERM) 5 % Place 1 patch onto the skin daily. Remove & Discard patch within 12 hours or as directed by MD 03/05/21   Panosh, Neta Mends, MD  MULTIPLE VITAMIN PO Take 1 tablet by mouth daily.    [provider]  SYNTHROID 50 MCG tablet TAKE 1 TABLET BY MOUTH DAILY 10/30/20   Panosh, Neta Mends, MD    Physical Exam: Vitals:   04/13/21 1723 04/13/21 1844 04/13/21 1939 04/13/21 2145  BP:  (!) 170/83 (!) 156/92 (!) 179/70  Pulse:  81 82 85  Resp:  18 18 16   Temp:    97.8 F (36.6 C)  TempSrc:    Oral  SpO2: 100% 100% 95% 94%      Constitutional: Acutely ill looking in mild distress due to pain Vitals:   04/13/21 1723 04/13/21 1844 04/13/21 1939 04/13/21 2145  BP:  (!) 170/83 (!) 156/92 (!) 179/70  Pulse:  81 82 85  Resp:  18 18 16   Temp:    97.8 F (36.6 C)  TempSrc:    Oral  SpO2: 100% 100% 95% 94%   Eyes: PERRL, lids and conjunctivae normal ENMT: Mucous membranes are moist. Posterior pharynx clear of any exudate or lesions.Normal dentition.  Neck: normal, supple, no masses, no thyromegaly Respiratory: clear to  auscultation bilaterally, no wheezing, no crackles. Normal respiratory effort. No accessory muscle use.  Cardiovascular: Regular rate and rhythm, no murmurs / rubs / gallops. No extremity edema. 2+ pedal pulses. No carotid bruits.  Abdomen: no tenderness, no masses palpated. No hepatosplenomegaly. Bowel sounds positive.  Musculoskeletal: Tenderness over the thoracic and lumbar spine with decreased range of motion no clubbing / cyanosis. No joint deformity upper and lower extremities. Normal muscle tone.  Skin: no rashes,  lesions, ulcers. No induration Neurologic: CN 2-12 grossly intact. Sensation intact, DTR normal. Strength 5/5 in all 4.  Psychiatric: Normal judgment and insight. Alert and oriented x 3. Normal mood.     Labs on Admission: I have personally reviewed following labs and imaging studies  CBC: Recent Labs  Lab 04/13/21 1655  WBC 6.2  NEUTROABS 5.0  HGB 10.6*  HCT 31.8*  MCV 106.4*  PLT 378   Basic Metabolic Panel: Recent Labs  Lab 04/13/21 1655  NA 133*  K 3.3*  CL 97*  CO2 26  GLUCOSE 109*  BUN 10  CREATININE 0.45  CALCIUM 9.5   GFR: CrCl cannot be calculated (Unknown ideal weight.). Liver Function Tests: Recent Labs  Lab 04/13/21 1655  AST 60*  ALT 71*  ALKPHOS 636*  BILITOT 0.6  PROT 6.8  ALBUMIN 3.5   No results for input(s): LIPASE, AMYLASE in the last 168 hours. No results for input(s): AMMONIA in the last 168 hours. Coagulation Profile: No results for input(s): INR, PROTIME in the last 168 hours. Cardiac Enzymes: No results for input(s): CKTOTAL, CKMB, CKMBINDEX, TROPONINI in the last 168 hours. BNP (last 3 results) No results for input(s): PROBNP in the last 8760 hours. HbA1C: No results for input(s): HGBA1C in the last 72 hours. CBG: No results for input(s): GLUCAP in the last 168 hours. Lipid Profile: No results for input(s): CHOL, HDL, LDLCALC, TRIG, CHOLHDL, LDLDIRECT in the last 72 hours. Thyroid Function Tests: No results for  input(s): TSH, T4TOTAL, FREET4, T3FREE, THYROIDAB in the last 72 hours. Anemia Panel: No results for input(s): VITAMINB12, FOLATE, FERRITIN, TIBC, IRON, RETICCTPCT in the last 72 hours. Urine analysis:    Component Value Date/Time   COLORURINE yellow 07/19/2007 0851   APPEARANCEUR Clear 07/19/2007 0851   LABSPEC >=1.030 07/19/2007 0851   PHURINE 5.0 07/19/2007 0851   HGBUR negative 07/19/2007 0851   BILIRUBINUR n 03/18/2016 1328   PROTEINUR n 03/18/2016 1328   UROBILINOGEN negative 03/18/2016 1328   UROBILINOGEN 0.2 07/19/2007 0851   NITRITE n 03/18/2016 1328   NITRITE negative 07/19/2007 0851   LEUKOCYTESUR Negative 03/18/2016 1328   Sepsis Labs: @LABRCNTIP (procalcitonin:4,lacticidven:4) ) Recent Results (from the past 240 hour(s))  Resp Panel by RT-PCR (Flu A&B, Covid) Nasopharyngeal Swab     Status: None   Collection Time: 04/13/21  7:50 PM   Specimen: Nasopharyngeal Swab; Nasopharyngeal(NP) swabs in vial transport medium  Result Value Ref Range Status   SARS Coronavirus 2 by RT PCR NEGATIVE NEGATIVE Final    Comment: (NOTE) SARS-CoV-2 target nucleic acids are NOT DETECTED.  The SARS-CoV-2 RNA is generally detectable in upper respiratory specimens during the acute phase of infection. The lowest concentration of SARS-CoV-2 viral copies this assay can detect is 138 copies/mL. A negative result does not preclude SARS-Cov-2 infection and should not be used as the sole basis for treatment or other patient management decisions. A negative result may occur with  improper specimen collection/handling, submission of specimen other than nasopharyngeal swab, presence of viral mutation(s) within the areas targeted by this assay, and inadequate number of viral copies(<138 copies/mL). A negative result must be combined with clinical observations, patient history, and epidemiological information. The expected result is Negative.  Fact Sheet for Patients:   BloggerCourse.com  Fact Sheet for Healthcare Providers:  SeriousBroker.it  This test is no t yet approved or cleared by the [place] FDA and  has been authorized for detection and/or diagnosis of SARS-CoV-2 by FDA under an Emergency Use Authorization (EUA).  This EUA will remain  in effect (meaning this test can be used) for the duration of the COVID-19 declaration under Section 564(b)(1) of the Act, 21 U.S.C.section 360bbb-3(b)(1), unless the authorization is terminated  or revoked sooner.       Influenza A by PCR NEGATIVE NEGATIVE Final   Influenza B by PCR NEGATIVE NEGATIVE Final    Comment: (NOTE) The Xpert Xpress SARS-CoV-2/FLU/RSV plus assay is intended as an aid in the diagnosis of influenza from Nasopharyngeal swab specimens and should not be used as a sole basis for treatment. Nasal washings and aspirates are unacceptable for Xpert Xpress SARS-CoV-2/FLU/RSV testing.  Fact Sheet for Patients: BloggerCourse.com  Fact Sheet for Healthcare Providers: SeriousBroker.it  This test is not yet approved or cleared by the [place] FDA and has been authorized for detection and/or diagnosis of SARS-CoV-2 by FDA under an Emergency Use Authorization (EUA). This EUA will remain in effect (meaning this test can be used) for the duration of the COVID-19 declaration under Section 564(b)(1) of the Act, 21 U.S.C. section 360bbb-3(b)(1), unless the authorization is terminated or revoked.  Performed at [facility], [address] [place], [place] [address]      Radiological Exams on Admission: CT Thoracic Spine Wo Contrast  Result Date: 04/13/2021 CLINICAL DATA:  Provided history: Pain. Additional history provided: Patient reports trip and fall this morning. Pain in area of coccyx. EXAM: CT THORACIC SPINE WITHOUT CONTRAST TECHNIQUE: Multidetector CT images  of the thoracic were obtained using the standard protocol without intravenous contrast. COMPARISON:  CT of the thoracic spine 02/05/2021. FINDINGS: Alignment: Thoracic dextrocurvature. Exaggerated thoracic kyphosis. No significant spondylolisthesis or bony retropulsion. Vertebrae: Unchanged mild T3 superior endplate compression deformity. Height loss at site of a T5 superior endplate compression fracture has slightly progressed as compared to the CT thoracic spine of 02/05/2021, now 20%. Interval T6 vertebral compression fracture (30% height loss). Progressive height loss at site of a T7 superior endplate vertebral compression fracture (now 30%). Unchanged severe T8 vertebral compression fracture. Progressive height loss at site of a T9 vertebral compression fracture (now approximately 70%). Interval height loss at site of a T10 vertebral compression fracture (now approximately 70%). Redemonstrated severe T11 vertebral compression fracture. Interval T12 compression fracture (20% height loss). Paraspinal and other soft tissues: Aortic atherosclerosis. No consolidation within the imaged lungs. Disc levels: Thoracic spondylosis without appreciable high-grade spinal canal stenosis. No compressive bony neural foraminal narrowing. IMPRESSION: 1. Comparison is made to the prior thoracic spine CT of 02/05/2021. 2. Unchanged mild T3 superior endplate vertebral compression deformity. 3. Height loss at site of a T5 superior endplate vertebral compression fracture has progressed (now 20%). 4. Interval T6 vertebral compression fracture (30% height loss). 5. Progressive height loss at site of a T7 superior endplate vertebral compression fracture (now 30%). 6. Unchanged severe T8 vertebral compression fracture. 7. Progressive height loss at site of a T9 vertebral compression fracture (now approximately 70%). 8. Interval height loss at site of a T10 vertebral compression fracture (now approximately 70%). 9. Unchanged severe T11  vertebral compression fracture. 10. Interval T12 compression fracture (20% height loss). 11. No significant bony retropulsion. 12. Thoracic spondylosis, without appreciable high-grade spinal canal stenosis. No compressive bony neural foraminal narrowing. 13. Exaggerated thoracic kyphosis. 14. Thoracic levocurvature. Electronically Signed   By: Jackey Loge D.O.   On: 04/13/2021 18:32   CT Lumbar Spine Wo Contrast  Result Date: 04/13/2021 CLINICAL DATA:  Pain. Additional history provided: Trip and fall today. Patient reports coccyx pain.  EXAM: CT LUMBAR SPINE WITHOUT CONTRAST TECHNIQUE: Multidetector CT imaging of the lumbar spine was performed without intravenous contrast administration. Multiplanar CT image reconstructions were also generated. COMPARISON:  CT of the lumbar spine 02/05/2021. FINDINGS: Segmentation: 5 lumbar vertebrae. The caudal most well-formed intervertebral disc space designated L5-S1. Alignment: Trace bony retropulsion at the level of the L1 superior endplate mild thoracic dextrocurvature. 3 mm L4-L5 grade 1 anterolisthesis. Vertebrae: Unchanged L1 vertebral compression fracture (50% height loss). Unchanged trace bony retropulsion at the level of the L1 superior endplate. Unchanged subtle L2 superior endplate vertebral compression deformity. Slight progressive height loss at site of a mild L3 vertebral compression fracture. New L4 compression deformity (20% height loss). Mild L5 superior endplate compression deformity with possible subtle progressive height loss (now 20-30%). New mildly displaced acute fracture through the sacrum at the S1-S2 junction. Subtle kyphotic angulation at the fracture site. Please note the coccyx is not included in the field of view. Paraspinal and other soft tissues: Aortic atherosclerosis. Colonic diverticulosis. Paraspinal soft tissues unremarkable. Disc levels: No more than mild disc space narrowing at any level. Lumbar spondylosis, most notably as follows. At  L4-L5, there is 3 mm grade 1 anterolisthesis. Disc uncovering with disc bulge. Facet arthrosis and ligamentum flavum hypertrophy. Bilateral subarticular stenosis with moderate central canal stenosis. Moderate bilateral neural foraminal narrowing. No appreciable significant central canal stenosis at the remaining levels. IMPRESSION: 1. Comparison is made to the prior lumbar spine CT of 02/05/2021. 2. Unchanged L1 vertebral compression fracture (50% height loss) with unchanged trace bony retropulsion at the level of the L1 superior endplate. 3. Unchanged subtle L2 superior endplate vertebral compression fracture. 4. Slight progressive height loss at site of a mild L3 vertebral compression fracture. 5. New L4 vertebral compression fracture (20% height loss). 6. Mild L5 superior endplate vertebral compression fracture with likely subtle progressive height loss (now 20-30%). 7. New mildly displaced acute fracture through the sacrum at the S1-S2 junction. Subtle kyphotic angulation at the fracture site. Please note the coccyx is not included in the field of view. 8. Lumbar spondylosis. Most notably at L4-L5, there is 3 mm grade 1 anterolisthesis. Disc uncovering with disc bulge. Facet arthrosis and ligamentum flavum hypertrophy. Bilateral subarticular stenosis with moderate central canal stenosis. Moderate bilateral neural foraminal narrowing. 9. Lumbar dextrocurvature. 10.  Aortic Atherosclerosis (ICD10-I70.0). Electronically Signed   By: Jackey Loge D.O.   On: 04/13/2021 18:46      Assessment/Plan Principal Problem:   Compression fracture of thoracic vertebra, closed, initial encounter [facility]) Active Problems:   Dyslipidemia   Hypothyroidism   Compression fracture of thoracic vertebra (HCC)   Compression of lumbar vertebra (HCC)   Sacral fracture, closed (HCC)     #1 fall with multiple compression fractures: Patient is being admitted for pain management.  TLSO brace ordered.  Patient does not want  narcotics.  Conservative measures including muscle relaxants were given.  Continue with PT and OT.  Possible placement if needed needed.  Has seen orthopedics in the past.  May need to follow-up.  #2 hypothyroidism: Continue with levothyroxine 50 mcg  #3 hyperlipidemia: Continue home regimen of atorvastatin and fish oil  #4 essential hypertension: Continue amlodipine  #5 anemia of chronic disease: Hemoglobin 10 g.  Appears to be chronic disease.  Continue to monitor  #6 hyponatremia: Probably dehydration.  Will hydrate  #7 hypokalemia: Replete  DVT prophylaxis: Lovenox Code Status: Full code Family Communication: No family at bedside Disposition Plan: Home Consults called: None Admission status:  Inpatient  Severity of Illness: The appropriate patient status for this patient is INPATIENT. Inpatient status is judged to be reasonable and necessary in order to provide the required intensity of service to ensure the patient's safety. The patient's presenting symptoms, physical exam findings, and initial radiographic and laboratory data in the context of their chronic comorbidities is felt to place them at high risk for further clinical deterioration. Furthermore, it is not anticipated that the patient will be medically stable for discharge from the hospital within 2 midnights of admission. The following factors support the patient status of inpatient.   " The patient's presenting symptoms include fall with back pain. " The worrisome physical exam findings include severe tenderness in the spine. " The initial radiographic and laboratory data are worrisome because of CT findings of multiple compression fractures. " The chronic co-morbidities include hypertension and hypothyroidism.   * I certify that at the point of admission it is my clinical judgment that the patient will require inpatient hospital care spanning beyond 2 midnights from the point of admission due to high intensity of service,  high risk for further deterioration and high frequency of surveillance required.Lonia Blood MD [facility] Pager [phone]  If 7PM-7AM, please contact night-coverage [email or web address] Password [facility]  04/13/2021, 9:51 PM

## 2021-04-13 NOTE — ED Notes (Signed)
Patient transported to CT 

## 2021-04-14 DIAGNOSIS — Z79899 Other long term (current) drug therapy: Secondary | ICD-10-CM | POA: Diagnosis not present

## 2021-04-14 DIAGNOSIS — S3219XA Other fracture of sacrum, initial encounter for closed fracture: Secondary | ICD-10-CM | POA: Diagnosis not present

## 2021-04-14 DIAGNOSIS — D649 Anemia, unspecified: Secondary | ICD-10-CM | POA: Diagnosis present

## 2021-04-14 DIAGNOSIS — D638 Anemia in other chronic diseases classified elsewhere: Secondary | ICD-10-CM | POA: Diagnosis not present

## 2021-04-14 DIAGNOSIS — E039 Hypothyroidism, unspecified: Secondary | ICD-10-CM | POA: Diagnosis not present

## 2021-04-14 DIAGNOSIS — E876 Hypokalemia: Secondary | ICD-10-CM | POA: Diagnosis present

## 2021-04-14 DIAGNOSIS — Z8262 Family history of osteoporosis: Secondary | ICD-10-CM | POA: Diagnosis not present

## 2021-04-14 DIAGNOSIS — M545 Low back pain, unspecified: Secondary | ICD-10-CM | POA: Diagnosis not present

## 2021-04-14 DIAGNOSIS — Z23 Encounter for immunization: Secondary | ICD-10-CM | POA: Diagnosis not present

## 2021-04-14 DIAGNOSIS — Z8249 Family history of ischemic heart disease and other diseases of the circulatory system: Secondary | ICD-10-CM | POA: Diagnosis not present

## 2021-04-14 DIAGNOSIS — E7800 Pure hypercholesterolemia, unspecified: Secondary | ICD-10-CM | POA: Diagnosis not present

## 2021-04-14 DIAGNOSIS — M4856XA Collapsed vertebra, not elsewhere classified, lumbar region, initial encounter for fracture: Secondary | ICD-10-CM | POA: Diagnosis not present

## 2021-04-14 DIAGNOSIS — M810 Age-related osteoporosis without current pathological fracture: Secondary | ICD-10-CM | POA: Diagnosis not present

## 2021-04-14 DIAGNOSIS — S22000A Wedge compression fracture of unspecified thoracic vertebra, initial encounter for closed fracture: Secondary | ICD-10-CM | POA: Diagnosis not present

## 2021-04-14 DIAGNOSIS — Z87891 Personal history of nicotine dependence: Secondary | ICD-10-CM | POA: Diagnosis not present

## 2021-04-14 DIAGNOSIS — Z20822 Contact with and (suspected) exposure to covid-19: Secondary | ICD-10-CM | POA: Diagnosis not present

## 2021-04-14 DIAGNOSIS — E871 Hypo-osmolality and hyponatremia: Secondary | ICD-10-CM | POA: Diagnosis not present

## 2021-04-14 DIAGNOSIS — I2510 Atherosclerotic heart disease of native coronary artery without angina pectoris: Secondary | ICD-10-CM | POA: Diagnosis not present

## 2021-04-14 DIAGNOSIS — I10 Essential (primary) hypertension: Secondary | ICD-10-CM | POA: Diagnosis not present

## 2021-04-14 DIAGNOSIS — M4854XA Collapsed vertebra, not elsewhere classified, thoracic region, initial encounter for fracture: Secondary | ICD-10-CM | POA: Diagnosis not present

## 2021-04-14 DIAGNOSIS — S3210XA Unspecified fracture of sacrum, initial encounter for closed fracture: Secondary | ICD-10-CM | POA: Diagnosis not present

## 2021-04-14 LAB — COMPREHENSIVE METABOLIC PANEL
ALT: 59 U/L — ABNORMAL HIGH (ref 0–44)
AST: 51 U/L — ABNORMAL HIGH (ref 15–41)
Albumin: 2.7 g/dL — ABNORMAL LOW (ref 3.5–5.0)
Alkaline Phosphatase: 549 U/L — ABNORMAL HIGH (ref 38–126)
Anion gap: 10 (ref 5–15)
BUN: 9 mg/dL (ref 8–23)
CO2: 24 mmol/L (ref 22–32)
Calcium: 8.8 mg/dL — ABNORMAL LOW (ref 8.9–10.3)
Chloride: 104 mmol/L (ref 98–111)
Creatinine, Ser: 0.36 mg/dL — ABNORMAL LOW (ref 0.44–1.00)
GFR, Estimated: >60 mL/min (ref >60)
Glucose, Bld: 88 mg/dL (ref 70–99)
Potassium: 3.2 mmol/L — ABNORMAL LOW (ref 3.5–5.1)
Sodium: 138 mmol/L (ref 135–145)
Total Bilirubin: 0.6 mg/dL (ref 0.3–1.2)
Total Protein: 5.8 g/dL — ABNORMAL LOW (ref 6.5–8.1)

## 2021-04-14 LAB — CBC
HCT: 26.4 % — ABNORMAL LOW (ref 36.0–46.0)
Hemoglobin: 8.9 g/dL — ABNORMAL LOW (ref 12.0–15.0)
MCH: 36.2 pg — ABNORMAL HIGH (ref 26.0–34.0)
MCHC: 33.7 g/dL (ref 30.0–36.0)
MCV: 107.3 fL — ABNORMAL HIGH (ref 80.0–100.0)
Platelets: 318 10*3/uL (ref 150–400)
RBC: 2.46 MIL/uL — ABNORMAL LOW (ref 3.87–5.11)
RDW: 14.8 % (ref 11.5–15.5)
WBC: 4.1 10*3/uL (ref 4.0–10.5)
nRBC: 0 % (ref 0.0–0.2)

## 2021-04-14 MED ORDER — SENNA 8.6 MG PO TABS: 1.0000 | ORAL_TABLET | Freq: Every day | ORAL | Status: DC | PRN

## 2021-04-14 MED ORDER — LEVOTHYROXINE SODIUM 50 MCG PO TABS
50.0000 ug | ORAL_TABLET | Freq: Every day | ORAL | Status: DC
  Administered 2021-04-14 – 2021-04-15 (×2): 50 ug via ORAL
  Filled 2021-04-14 (×2): qty 1

## 2021-04-14 MED ORDER — AMLODIPINE BESYLATE 5 MG PO TABS
5.0000 mg | ORAL_TABLET | Freq: Every day | ORAL | Status: DC
  Administered 2021-04-14: 5 mg via ORAL
  Filled 2021-04-14: qty 1

## 2021-04-14 MED ORDER — FOLIC ACID 1 MG PO TABS
1.0000 mg | ORAL_TABLET | Freq: Every day | ORAL | Status: DC
  Administered 2021-04-14 – 2021-04-15 (×2): 1 mg via ORAL
  Filled 2021-04-14 (×2): qty 1

## 2021-04-14 MED ORDER — POTASSIUM CHLORIDE 20 MEQ PO PACK
20.0000 meq | PACK | Freq: Once | ORAL | Status: AC
  Administered 2021-04-14: 20 meq via ORAL
  Filled 2021-04-14: qty 1

## 2021-04-14 MED ORDER — BACLOFEN 10 MG PO TABS: 5.0000 mg | ORAL_TABLET | Freq: Three times a day (TID) | ORAL | Status: DC | PRN

## 2021-04-14 MED ORDER — ATORVASTATIN CALCIUM 40 MG PO TABS
80.0000 mg | ORAL_TABLET | Freq: Every day | ORAL | Status: DC
  Administered 2021-04-14 – 2021-04-15 (×2): 80 mg via ORAL
  Filled 2021-04-14 (×2): qty 2

## 2021-04-14 MED ORDER — TRAMADOL HCL 50 MG PO TABS
50.0000 mg | ORAL_TABLET | Freq: Four times a day (QID) | ORAL | Status: DC | PRN
  Administered 2021-04-14: 50 mg via ORAL
  Filled 2021-04-14: qty 1

## 2021-04-14 NOTE — Progress Notes (Signed)
Orthopedic Tech Progress Note Patient Details:  Natasha Harper [birth year] [id number]  Patient ID: Natasha Harper, female   DOB: 03/25/1935, 86 y.o.   MRN: [id number]  Saul Fordyce 04/14/2021, 10:20 AMChecking on the status of TLSO brace.

## 2021-04-14 NOTE — Progress Notes (Signed)
Patient ID: Natasha Harper, female   DOB: [birth year], 86 y.o.   MRN: [id number]  PROGRESS NOTE    Natasha Harper  AOZ:[id number] DOB: [birth year] DOA: 04/13/2021 PCP: [name], MD   Brief Narrative:  86 y.o. female with medical history significant of coronary artery disease, hyperlipidemia, chronic dysfunction CHF, essential hypertension, hyperlipidemia, hypothyroidism, compression vertebral fractures presented with mechanical fall at home with worsening back pain.  On presentation, CT scan of the lumbar and thoracic spine showed multiple old compression fractures with possibility of questionable new L4 fracture along with sacral fracture.  ED provider communicated with neurosurgeon on-call who recommended conservative management and TLSO brace.  Patient refused brace.  She was admitted for pain management.  Assessment & Plan:   Possible mechanical fall with multiple thoracic and lumbar vertebral compression fractures along with possible sacral fracture at S1-S2 -Patient has chronic vertebral compression fractures and might have new L4 fracture along with sacral fracture as per imaging. -ED provider communicated with neurosurgeon on-call who recommended conservative management and TLSO brace.  Patient refused brace stating that brace did not help last time and made pain worse.  -PT/OT eval.  Pain management.  Wants to avoid narcotics as much as possible.  Bowel regimen. -I communicated with Dr. Dawley/neurosurgery via secure chat who recommended conservative management and patient is not a candidate for kyphoplasty  Hypothyroidism--continue levothyroxine  Hyperlipidemia -Continue atorvastatin  Essential hypertension -Continue amlodipine  Anemia of chronic disease Macrocytosis -hemoglobin stable.   -Empirically start oral folic acid supplementation.  Check vitamin B12 level in AM.  Hyponatremia -Treated with IV fluids.  Resolved.  DC IV fluids.  Hypokalemia -Replace.  DVT  prophylaxis: Lovenox Code Status: Full Family Communication: Husband at bedside Disposition Plan: Status is: Inpatient  Remains inpatient appropriate because:Inpatient level of care appropriate due to severity of illness  Dispo: The patient is from: Home              Anticipated d/c is to: Home              Patient currently is not medically stable to d/c.   Difficult to place patient No  Consultants: Communicated with neurosurgery via secure chat  Procedures: None  Antimicrobials: None   Subjective: Patient seen and examined at bedside.  Has not gotten out of bed yet.  Denies nausea, vomiting, fever or vomiting.  Objective: Vitals:   04/13/21 1844 04/13/21 1939 04/13/21 2145 04/14/21 0241  BP: (!) 170/83 (!) 156/92 (!) 179/70 (!) 160/69  Pulse: 81 82 85 84  Resp: 18 18 16 17   Temp:   97.8 F (36.6 C) 98.5 F (36.9 C)  TempSrc:   Oral   SpO2: 100% 95% 94% 93%  Weight:   46 kg   Height:   5\' 1"  (1.549 m)     Intake/Output Summary (Last 24 hours) at 04/14/2021 1032 Last data filed at 04/14/2021 1015 Gross per 24 hour  Intake 1092.42 ml  Output 450 ml  Net 642.42 ml   Filed Weights   04/13/21 2145  Weight: 46 kg    Examination:  General exam: Appears calm and comfortable.  Currently on room air. Respiratory system: Bilateral decreased breath sounds at bases Cardiovascular system: S1 & S2 heard, Rate controlled Gastrointestinal system: Abdomen is nondistended, soft and nontender. Normal bowel sounds heard. Extremities: No cyanosis, clubbing, edema  Central nervous system: Alert and oriented. No focal neurological deficits. Moving extremities Skin: No rashes, lesions or ulcers Psychiatry: Judgement and insight  appear normal. Mood & affect appropriate.     Data Reviewed: I have personally reviewed following labs and imaging studies  CBC: Recent Labs  Lab 04/13/21 1655 04/14/21 0325  WBC 6.2 4.1  NEUTROABS 5.0  --   HGB 10.6* 8.9*  HCT 31.8* 26.4*  MCV  106.4* 107.3*  PLT 378 318   Basic Metabolic Panel: Recent Labs  Lab 04/13/21 1655 04/14/21 0325  NA 133* 138  K 3.3* 3.2*  CL 97* 104  CO2 26 24  GLUCOSE 109* 88  BUN 10 9  CREATININE 0.45 0.36*  CALCIUM 9.5 8.8*   GFR: Estimated Creatinine Clearance: 38.7 mL/min (A) (by C-G formula based on SCr of 0.36 mg/dL (L)). Liver Function Tests: Recent Labs  Lab 04/13/21 1655 04/14/21 0325  AST 60* 51*  ALT 71* 59*  ALKPHOS 636* 549*  BILITOT 0.6 0.6  PROT 6.8 5.8*  ALBUMIN 3.5 2.7*   No results for input(s): LIPASE, AMYLASE in the last 168 hours. No results for input(s): AMMONIA in the last 168 hours. Coagulation Profile: No results for input(s): INR, PROTIME in the last 168 hours. Cardiac Enzymes: No results for input(s): CKTOTAL, CKMB, CKMBINDEX, TROPONINI in the last 168 hours. BNP (last 3 results) No results for input(s): PROBNP in the last 8760 hours. HbA1C: No results for input(s): HGBA1C in the last 72 hours. CBG: No results for input(s): GLUCAP in the last 168 hours. Lipid Profile: No results for input(s): CHOL, HDL, LDLCALC, TRIG, CHOLHDL, LDLDIRECT in the last 72 hours. Thyroid Function Tests: No results for input(s): TSH, T4TOTAL, FREET4, T3FREE, THYROIDAB in the last 72 hours. Anemia Panel: No results for input(s): VITAMINB12, FOLATE, FERRITIN, TIBC, IRON, RETICCTPCT in the last 72 hours. Sepsis Labs: No results for input(s): PROCALCITON, LATICACIDVEN in the last 168 hours.  Recent Results (from the past 240 hour(s))  Resp Panel by RT-PCR (Flu A&B, Covid) Nasopharyngeal Swab     Status: None   Collection Time: 04/13/21  7:50 PM   Specimen: Nasopharyngeal Swab; Nasopharyngeal(NP) swabs in vial transport medium  Result Value Ref Range Status   SARS Coronavirus 2 by RT PCR NEGATIVE NEGATIVE Final    Comment: (NOTE) SARS-CoV-2 target nucleic acids are NOT DETECTED.  The SARS-CoV-2 RNA is generally detectable in upper respiratory specimens during the  acute phase of infection. The lowest concentration of SARS-CoV-2 viral copies this assay can detect is 138 copies/mL. A negative result does not preclude SARS-Cov-2 infection and should not be used as the sole basis for treatment or other patient management decisions. A negative result may occur with  improper specimen collection/handling, submission of specimen other than nasopharyngeal swab, presence of viral mutation(s) within the areas targeted by this assay, and inadequate number of viral copies(<138 copies/mL). A negative result must be combined with clinical observations, patient history, and epidemiological information. The expected result is Negative.  Fact Sheet for Patients:  BloggerCourse.com  Fact Sheet for Healthcare Providers:  SeriousBroker.it  This test is no t yet approved or cleared by the [place] FDA and  has been authorized for detection and/or diagnosis of SARS-CoV-2 by FDA under an Emergency Use Authorization (EUA). This EUA will remain  in effect (meaning this test can be used) for the duration of the COVID-19 declaration under Section 564(b)(1) of the Act, 21 U.S.C.section 360bbb-3(b)(1), unless the authorization is terminated  or revoked sooner.       Influenza A by PCR NEGATIVE NEGATIVE Final   Influenza B by PCR NEGATIVE NEGATIVE Final  Comment: (NOTE) The Xpert Xpress SARS-CoV-2/FLU/RSV plus assay is intended as an aid in the diagnosis of influenza from Nasopharyngeal swab specimens and should not be used as a sole basis for treatment. Nasal washings and aspirates are unacceptable for Xpert Xpress SARS-CoV-2/FLU/RSV testing.  Fact Sheet for Patients: BloggerCourse.com  Fact Sheet for Healthcare Providers: SeriousBroker.it  This test is not yet approved or cleared by the [place] FDA and has been authorized for detection and/or  diagnosis of SARS-CoV-2 by FDA under an Emergency Use Authorization (EUA). This EUA will remain in effect (meaning this test can be used) for the duration of the COVID-19 declaration under Section 564(b)(1) of the Act, 21 U.S.C. section 360bbb-3(b)(1), unless the authorization is terminated or revoked.  Performed at [facility], [address], [place], [place] [address]          Radiology Studies: CT Thoracic Spine Wo Contrast  Result Date: 04/13/2021 CLINICAL DATA:  Provided history: Pain. Additional history provided: Patient reports trip and fall this morning. Pain in area of coccyx. EXAM: CT THORACIC SPINE WITHOUT CONTRAST TECHNIQUE: Multidetector CT images of the thoracic were obtained using the standard protocol without intravenous contrast. COMPARISON:  CT of the thoracic spine 02/05/2021. FINDINGS: Alignment: Thoracic dextrocurvature. Exaggerated thoracic kyphosis. No significant spondylolisthesis or bony retropulsion. Vertebrae: Unchanged mild T3 superior endplate compression deformity. Height loss at site of a T5 superior endplate compression fracture has slightly progressed as compared to the CT thoracic spine of 02/05/2021, now 20%. Interval T6 vertebral compression fracture (30% height loss). Progressive height loss at site of a T7 superior endplate vertebral compression fracture (now 30%). Unchanged severe T8 vertebral compression fracture. Progressive height loss at site of a T9 vertebral compression fracture (now approximately 70%). Interval height loss at site of a T10 vertebral compression fracture (now approximately 70%). Redemonstrated severe T11 vertebral compression fracture. Interval T12 compression fracture (20% height loss). Paraspinal and other soft tissues: Aortic atherosclerosis. No consolidation within the imaged lungs. Disc levels: Thoracic spondylosis without appreciable high-grade spinal canal stenosis. No compressive bony neural foraminal  narrowing. IMPRESSION: 1. Comparison is made to the prior thoracic spine CT of 02/05/2021. 2. Unchanged mild T3 superior endplate vertebral compression deformity. 3. Height loss at site of a T5 superior endplate vertebral compression fracture has progressed (now 20%). 4. Interval T6 vertebral compression fracture (30% height loss). 5. Progressive height loss at site of a T7 superior endplate vertebral compression fracture (now 30%). 6. Unchanged severe T8 vertebral compression fracture. 7. Progressive height loss at site of a T9 vertebral compression fracture (now approximately 70%). 8. Interval height loss at site of a T10 vertebral compression fracture (now approximately 70%). 9. Unchanged severe T11 vertebral compression fracture. 10. Interval T12 compression fracture (20% height loss). 11. No significant bony retropulsion. 12. Thoracic spondylosis, without appreciable high-grade spinal canal stenosis. No compressive bony neural foraminal narrowing. 13. Exaggerated thoracic kyphosis. 14. Thoracic levocurvature. Electronically Signed   By: Jackey Loge D.O.   On: 04/13/2021 18:32   CT Lumbar Spine Wo Contrast  Result Date: 04/13/2021 CLINICAL DATA:  Pain. Additional history provided: Trip and fall today. Patient reports coccyx pain. EXAM: CT LUMBAR SPINE WITHOUT CONTRAST TECHNIQUE: Multidetector CT imaging of the lumbar spine was performed without intravenous contrast administration. Multiplanar CT image reconstructions were also generated. COMPARISON:  CT of the lumbar spine 02/05/2021. FINDINGS: Segmentation: 5 lumbar vertebrae. The caudal most well-formed intervertebral disc space designated L5-S1. Alignment: Trace bony retropulsion at the level of the L1 superior endplate  mild thoracic dextrocurvature. 3 mm L4-L5 grade 1 anterolisthesis. Vertebrae: Unchanged L1 vertebral compression fracture (50% height loss). Unchanged trace bony retropulsion at the level of the L1 superior endplate. Unchanged subtle L2  superior endplate vertebral compression deformity. Slight progressive height loss at site of a mild L3 vertebral compression fracture. New L4 compression deformity (20% height loss). Mild L5 superior endplate compression deformity with possible subtle progressive height loss (now 20-30%). New mildly displaced acute fracture through the sacrum at the S1-S2 junction. Subtle kyphotic angulation at the fracture site. Please note the coccyx is not included in the field of view. Paraspinal and other soft tissues: Aortic atherosclerosis. Colonic diverticulosis. Paraspinal soft tissues unremarkable. Disc levels: No more than mild disc space narrowing at any level. Lumbar spondylosis, most notably as follows. At L4-L5, there is 3 mm grade 1 anterolisthesis. Disc uncovering with disc bulge. Facet arthrosis and ligamentum flavum hypertrophy. Bilateral subarticular stenosis with moderate central canal stenosis. Moderate bilateral neural foraminal narrowing. No appreciable significant central canal stenosis at the remaining levels. IMPRESSION: 1. Comparison is made to the prior lumbar spine CT of 02/05/2021. 2. Unchanged L1 vertebral compression fracture (50% height loss) with unchanged trace bony retropulsion at the level of the L1 superior endplate. 3. Unchanged subtle L2 superior endplate vertebral compression fracture. 4. Slight progressive height loss at site of a mild L3 vertebral compression fracture. 5. New L4 vertebral compression fracture (20% height loss). 6. Mild L5 superior endplate vertebral compression fracture with likely subtle progressive height loss (now 20-30%). 7. New mildly displaced acute fracture through the sacrum at the S1-S2 junction. Subtle kyphotic angulation at the fracture site. Please note the coccyx is not included in the field of view. 8. Lumbar spondylosis. Most notably at L4-L5, there is 3 mm grade 1 anterolisthesis. Disc uncovering with disc bulge. Facet arthrosis and ligamentum flavum  hypertrophy. Bilateral subarticular stenosis with moderate central canal stenosis. Moderate bilateral neural foraminal narrowing. 9. Lumbar dextrocurvature. 10.  Aortic Atherosclerosis (ICD10-I70.0). Electronically Signed   By: Jackey Loge D.O.   On: 04/13/2021 18:46        Scheduled Meds:  amLODipine  5 mg Oral Daily   atorvastatin  80 mg Oral Daily   enoxaparin (LOVENOX) injection  40 mg Subcutaneous Q24H   influenza vaccine adjuvanted  0.5 mL Intramuscular Tomorrow-1000   levothyroxine  50 mcg Oral Q0600   lidocaine  1 patch Transdermal Q24H   Continuous Infusions:  sodium chloride 50 mL/hr at 04/14/21 0849          Glade Lloyd, MD [facility] 04/14/2021, 10:32 AM

## 2021-04-14 NOTE — Plan of Care (Signed)
  Problem: Pain Managment: Goal: General experience of comfort will improve Outcome: Progressing   

## 2021-04-15 DIAGNOSIS — E039 Hypothyroidism, unspecified: Secondary | ICD-10-CM | POA: Diagnosis not present

## 2021-04-15 DIAGNOSIS — D638 Anemia in other chronic diseases classified elsewhere: Secondary | ICD-10-CM | POA: Diagnosis not present

## 2021-04-15 DIAGNOSIS — E876 Hypokalemia: Secondary | ICD-10-CM | POA: Diagnosis not present

## 2021-04-15 DIAGNOSIS — Z888 Allergy status to other drugs, medicaments and biological substances status: Secondary | ICD-10-CM | POA: Diagnosis not present

## 2021-04-15 DIAGNOSIS — W19XXXA Unspecified fall, initial encounter: Secondary | ICD-10-CM | POA: Diagnosis present

## 2021-04-15 DIAGNOSIS — Z8262 Family history of osteoporosis: Secondary | ICD-10-CM | POA: Diagnosis not present

## 2021-04-15 DIAGNOSIS — Z7982 Long term (current) use of aspirin: Secondary | ICD-10-CM | POA: Diagnosis not present

## 2021-04-15 DIAGNOSIS — M810 Age-related osteoporosis without current pathological fracture: Secondary | ICD-10-CM | POA: Diagnosis not present

## 2021-04-15 DIAGNOSIS — Z8249 Family history of ischemic heart disease and other diseases of the circulatory system: Secondary | ICD-10-CM | POA: Diagnosis not present

## 2021-04-15 DIAGNOSIS — M545 Low back pain, unspecified: Secondary | ICD-10-CM | POA: Diagnosis not present

## 2021-04-15 DIAGNOSIS — M4854XA Collapsed vertebra, not elsewhere classified, thoracic region, initial encounter for fracture: Secondary | ICD-10-CM | POA: Diagnosis not present

## 2021-04-15 DIAGNOSIS — M8008XA Age-related osteoporosis with current pathological fracture, vertebra(e), initial encounter for fracture: Secondary | ICD-10-CM | POA: Diagnosis present

## 2021-04-15 DIAGNOSIS — Z79899 Other long term (current) drug therapy: Secondary | ICD-10-CM | POA: Diagnosis not present

## 2021-04-15 DIAGNOSIS — M4856XA Collapsed vertebra, not elsewhere classified, lumbar region, initial encounter for fracture: Secondary | ICD-10-CM | POA: Diagnosis not present

## 2021-04-15 DIAGNOSIS — S22000A Wedge compression fracture of unspecified thoracic vertebra, initial encounter for closed fracture: Secondary | ICD-10-CM | POA: Diagnosis not present

## 2021-04-15 DIAGNOSIS — Z803 Family history of malignant neoplasm of breast: Secondary | ICD-10-CM | POA: Diagnosis not present

## 2021-04-15 DIAGNOSIS — S3219XA Other fracture of sacrum, initial encounter for closed fracture: Secondary | ICD-10-CM | POA: Diagnosis not present

## 2021-04-15 DIAGNOSIS — Z20822 Contact with and (suspected) exposure to covid-19: Secondary | ICD-10-CM | POA: Diagnosis not present

## 2021-04-15 DIAGNOSIS — Z87891 Personal history of nicotine dependence: Secondary | ICD-10-CM | POA: Diagnosis not present

## 2021-04-15 DIAGNOSIS — E871 Hypo-osmolality and hyponatremia: Secondary | ICD-10-CM | POA: Diagnosis not present

## 2021-04-15 DIAGNOSIS — I2510 Atherosclerotic heart disease of native coronary artery without angina pectoris: Secondary | ICD-10-CM | POA: Diagnosis not present

## 2021-04-15 DIAGNOSIS — I10 Essential (primary) hypertension: Secondary | ICD-10-CM | POA: Diagnosis not present

## 2021-04-15 DIAGNOSIS — E860 Dehydration: Secondary | ICD-10-CM | POA: Diagnosis not present

## 2021-04-15 DIAGNOSIS — Z79890 Hormone replacement therapy (postmenopausal): Secondary | ICD-10-CM | POA: Diagnosis not present

## 2021-04-15 DIAGNOSIS — Z23 Encounter for immunization: Secondary | ICD-10-CM | POA: Diagnosis not present

## 2021-04-15 DIAGNOSIS — S3210XA Unspecified fracture of sacrum, initial encounter for closed fracture: Secondary | ICD-10-CM | POA: Diagnosis not present

## 2021-04-15 DIAGNOSIS — Y92019 Unspecified place in single-family (private) house as the place of occurrence of the external cause: Secondary | ICD-10-CM | POA: Diagnosis not present

## 2021-04-15 DIAGNOSIS — E7800 Pure hypercholesterolemia, unspecified: Secondary | ICD-10-CM | POA: Diagnosis not present

## 2021-04-15 DIAGNOSIS — D7589 Other specified diseases of blood and blood-forming organs: Secondary | ICD-10-CM | POA: Diagnosis not present

## 2021-04-15 LAB — VITAMIN B12: Vitamin B-12: 740 pg/mL (ref 180–914)

## 2021-04-15 MED ORDER — TRAMADOL HCL 50 MG PO TABS: 50.0000 mg | ORAL_TABLET | Freq: Four times a day (QID) | ORAL | 0 refills | Status: DC | PRN

## 2021-04-15 MED ORDER — FOLIC ACID 1 MG PO TABS: 1.0000 mg | ORAL_TABLET | Freq: Every day | ORAL | 0 refills | Status: DC

## 2021-04-15 MED ORDER — POTASSIUM CHLORIDE CRYS ER 20 MEQ PO TBCR
40.0000 meq | EXTENDED_RELEASE_TABLET | Freq: Once | ORAL | Status: AC
  Administered 2021-04-15: 40 meq via ORAL
  Filled 2021-04-15: qty 2

## 2021-04-15 NOTE — Evaluation (Signed)
Occupational Therapy Evaluation Patient Details Name: Natasha Harper MRN: [id number] DOB: [birth year] Today's Date: 04/15/2021   History of Present Illness 86 y.o. female  presented with mechanical fall at home with worsening back pain.  On presentation, CT scan of the lumbar and thoracic spine showed multiple old compression fractures with possibility of questionable new L4 fracture along with sacral fracture. Pt had a recent admission  August 2-Aug 7 for falls and multiple thoracic and lumbar compression fractures. Pt was DCed with a TLSO but pt stated she's not been able to tolerate wearing it.  Pt with medical history significant of coronary artery disease, hyperlipidemia, chronic dysfunction CHF, essential hypertension, hyperlipidemia, hypothyroidism, compression vertebral fractures.   Clinical Impression   Patient was previously living at home with husband with independence in ADLs at [facility] level. Currently, patient is max A for LB Dressing without AE. With AE and education patient was min guard for LB dressing tasks. Patient reported husband will  be home with her 24/7.Patient would continue to benefit from skilled OT services at this time while admitted and after d/c to address noted deficits in order to improve overall safety and independence in ADLs.        Recommendations for follow up therapy are one component of a multi-disciplinary discharge planning process, led by the attending physician.  Recommendations may be updated based on patient status, additional functional criteria and insurance authorization.   Follow Up Recommendations  Home health OT;Supervision/Assistance - 24 hour    Equipment Recommendations  Other (comment) (total hip kit)    Recommendations for Other Services       Precautions / Restrictions Precautions Precautions: Fall;Back Precaution Comments: multiple recent falls, HOH Required Braces or Orthoses: Spinal Brace Spinal Brace: Thoracolumbosacral  orthotic Restrictions Weight Bearing Restrictions: No Other Position/Activity Restrictions: pt was issued a TLSO during August admission, she reports she cannot tolerate wearing it.      Mobility Bed Mobility Overal bed mobility: Needs Assistance Bed Mobility: Rolling;Sidelying to Sit Rolling: Supervision Sidelying to sit: Min assist       General bed mobility comments: patient was seated up in recliner    Transfers Overall transfer level: Needs assistance Equipment used: Rolling walker (2 wheeled) Transfers: Sit to/from Stand Sit to Stand: Min guard         General transfer comment: assist to power up    Balance Overall balance assessment: Needs assistance;History of Falls Sitting-balance support: Feet supported;No upper extremity supported Sitting balance-[name]: Good     Standing balance support: During functional activity Standing balance-[name]: Fair Standing balance comment: relies on BUE support                           ADL either performed or assessed with clinical judgement   ADL Overall ADL's : Needs assistance/impaired Eating/Feeding: Modified independent;Sitting   Grooming: Wash/dry face;Wash/dry hands;Sitting;Set up   Upper Body Bathing: Min guard;Sitting Upper Body Bathing Details (indicate cue type and reason): patient does not want to participate in UB washing secondary to personal insecurities at this time. Lower Body Bathing: Sitting/lateral leans;Min guard Lower Body Bathing Details (indicate cue type and reason): would be MI with reacher and or long handled sponge. patient was educated and patient and husband reported they will purchase items. Upper Body Dressing : Modified independent;Standing   Lower Body Dressing: Min guard;Sit to/from stand;With adaptive equipment Lower Body Dressing Details (indicate cue type and reason): patient was able to don/doff  socks with reacher and sock aid after education on how to use  items. patient was also able to don pants with education on how to use reacher. patient reported liking these items and that it hurt her back much less to complete this way. Toilet Transfer: Administrator and Hygiene: Min guard;Sit to/from stand       Functional mobility during ADLs: Rolling walker;Min guard       Vision Patient Visual Report: No change from baseline       Perception     Praxis      Pertinent Vitals/Pain Pain Assessment: Faces Faces Pain Scale: Hurts little more Pain Location: mid back Pain Descriptors / Indicators: Dull Pain Intervention(s): Limited activity within patient's tolerance;Monitored during session     Hand Dominance Right   Extremity/Trunk Assessment Upper Extremity Assessment Upper Extremity Assessment: Overall WFL for tasks assessed   Lower Extremity Assessment Lower Extremity Assessment: Defer to PT evaluation   Cervical / Trunk Assessment Cervical / Trunk Assessment: Kyphotic   Communication Communication Communication: HOH   Cognition Arousal/Alertness: Awake/alert Behavior During Therapy: WFL for tasks assessed/performed Overall Cognitive Status: Within Functional Limits for tasks assessed                                     General Comments       Exercises     Shoulder Instructions      Home Living Family/patient expects to be discharged to:: Private residence Living Arrangements: Spouse/significant other Available Help at Discharge: Family;Available 24 hours/day Type of Home: House Home Access: Level entry     Home Layout: One level     Bathroom Shower/Tub: Walk-in shower         Home Equipment: Environmental consultant - 2 wheels   Additional Comments: spouse has a hernia and a "bad knee" so can provide only limited assistance      Prior Functioning/Environment Level of Independence: Independent with assistive device(s)        Comments: uses walker at  home        OT Problem List: Decreased strength;Decreased range of motion;Impaired balance (sitting and/or standing);Decreased activity tolerance;Decreased safety awareness;Decreased knowledge of precautions;Decreased knowledge of use of DME or AE      OT Treatment/Interventions: Self-care/ADL training;DME and/or AE instruction;Energy conservation;Therapeutic activities;Balance training;Patient/family education    OT Goals(Current goals can be found in the care plan section) Acute Rehab OT Goals Patient Stated Goal: to go home OT Goal Formulation: With patient Time For Goal Achievement: 04/29/21 Potential to Achieve Goals: Good  OT Frequency: Min 2X/week   Barriers to D/C:            Co-evaluation              AM-PAC OT "6 Clicks" Daily Activity     Outcome Measure Help from another person eating meals?: None Help from another person taking care of personal grooming?: A Little Help from another person toileting, which includes using toliet, bedpan, or urinal?: A Little Help from another person bathing (including washing, rinsing, drying)?: A Little (with AE) Help from another person to put on and taking off regular upper body clothing?: None Help from another person to put on and taking off regular lower body clothing?: A Little 6 Click Score: 20   End of Session Equipment Utilized During Treatment: Gait belt;Rolling walker Nurse Communication: Other (comment) (nurese ok patient to  participate in session)  Activity Tolerance: Patient tolerated treatment well Patient left: in chair;with call bell/phone within reach;with chair alarm set;with family/visitor present  OT Visit Diagnosis: Unsteadiness on feet (R26.81);Muscle weakness (generalized) (M62.81);Pain                Time: 1240-1303 OT Time Calculation (min): 23 min Charges:  OT General Charges $OT Visit: 1 Visit OT Evaluation $OT Eval Low Complexity: 1 Low OT Treatments $Self Care/Home Management : 8-22  mins  Jackelyn Poling OTR/L, MS Acute Rehabilitation Department Office# [phone] Pager# [phone]   Merlene Morse Ameisha Mcclellan 04/15/2021, 1:09 PM

## 2021-04-15 NOTE — Discharge Summary (Signed)
Physician Discharge Summary  Natasha Harper KYH:[id number] DOB: 17-Mar-1935 DOA: 04/13/2021  PCP: [name], MD  Admit date: 04/13/2021 Discharge date: 04/15/2021  Admitted From: Home Disposition: Home. Refused SNF  Recommendations for Outpatient Follow-up:  Follow up with PCP in 1 week  Follow up in ED if symptoms worsen or new appear   Home Health: Home health PT Equipment/Devices: None  Discharge Condition: Stable CODE STATUS: Full Diet recommendation: Heart healthy  Brief/Interim Summary: 86 y.o. female with medical history significant of coronary artery disease, hyperlipidemia, chronic dysfunction CHF, essential hypertension, hyperlipidemia, hypothyroidism, compression vertebral fractures presented with mechanical fall at home with worsening back pain.  On presentation, CT scan of the lumbar and thoracic spine showed multiple old compression fractures with possibility of questionable new L4 fracture along with sacral fracture.  ED provider communicated with neurosurgeon on-call who recommended conservative management and TLSO brace.  Patient refused brace.  She was admitted for pain management. PT recommended SNF placement. Patient refused SNF. She will be discharged home today with home health PT.  Discharge Diagnoses:   Possible mechanical fall with multiple thoracic and lumbar vertebral compression fractures along with possible sacral fracture at S1-S2 -Patient has chronic vertebral compression fractures and might have new L4 fracture along with sacral fracture as per imaging. -ED provider communicated with neurosurgeon on-call who recommended conservative management and TLSO brace.  Patient refused brace stating that brace did not help last time and made pain worse.  -Continue pain management.  Wants to avoid narcotics as much as possible.   -I communicated with Dr. Dawley/neurosurgery via secure chat who recommended conservative management and patient is not a candidate  for kyphoplasty - PT recommended SNF placement. Patient refused SNF. She will be discharged home today with home health PT.   Hypothyroidism--continue levothyroxine  Hyperlipidemia -Continue atorvastatin  Essential hypertension -Continue amlodipine  Anemia of chronic disease Macrocytosis -hemoglobin stable.   -Continue empiric oral folic acid supplementation.  Vitamin B12 level normal.   Hyponatremia -Treated with IV fluids.  Resolved.  DC'd IV fluids.   Hypokalemia -Replaced.  Discharge Instructions  Discharge Instructions     Diet - low sodium heart healthy   Complete by: As directed    Increase activity slowly   Complete by: As directed    No wound care   Complete by: As directed       Allergies as of 04/15/2021       Reactions   Actonel [risedronate Sodium]    bloating   Evista [raloxifene]    Abdominal cramps   Fosamax [alendronate Sodium]    Abdominal cramps        Medication List     STOP taking these medications    furosemide 20 MG tablet Commonly known as: LASIX   ketoconazole 2 % cream Commonly known as: NIZORAL       TAKE these medications    acetaminophen 500 MG tablet Commonly known as: TYLENOL Take 1,000 mg by mouth every 6 (six) hours as needed for moderate pain.   amLODipine 5 MG tablet Commonly known as: NORVASC Take 1 tablet (5 mg total) by mouth daily. What changed:  when to take this reasons to take this   aspirin 81 MG tablet Take 81 mg by mouth daily.   atorvastatin 80 MG tablet Commonly known as: LIPITOR TAKE 1 TABLET BY MOUTH DAILY   baclofen 10 MG tablet Commonly known as: LIORESAL Take 0.5-1 tablets (5-10 mg total) by mouth 3 (three) times daily  as needed for muscle spasms.   cholecalciferol 25 MCG (1000 UNIT) tablet Commonly known as: VITAMIN D3 Take 1,000 Units by mouth daily.   fish oil-omega-3 fatty acids 1000 MG capsule Take 1,000 mg by mouth daily.   folic acid 1 MG tablet Commonly known as:  FOLVITE Take 1 tablet (1 mg total) by mouth daily. Start taking on: April 16, 2021   lidocaine 5 % Commonly known as: LIDODERM Place 1 patch onto the skin daily. Remove & Discard patch within 12 hours or as directed by MD What changed:  when to take this reasons to take this   MULTIPLE VITAMIN PO Take 1 tablet by mouth daily.   Synthroid 50 MCG tablet Generic drug: levothyroxine TAKE 1 TABLET BY MOUTH DAILY   traMADol 50 MG tablet Commonly known as: ULTRAM Take 1 tablet (50 mg total) by mouth every 6 (six) hours as needed for moderate pain.   vitamin C 100 MG tablet Take 100 mg by mouth daily.   VITAMIN K2 PO Take 1 tablet by mouth daily.        Follow-up Information     Panosh, Neta Mends, MD. Schedule an appointment as soon as possible for a visit in 1 week(s).   Specialties: Internal Medicine, Pediatrics Contact information: [address] [place] [address] [phone]         Pricilla Riffle, MD .   Specialty: Cardiology Contact information: [address] [place] [address] [phone]                Allergies  Allergen Reactions   Actonel [Risedronate Sodium]     bloating   Evista [Raloxifene]     Abdominal cramps   Fosamax [Alendronate Sodium]     Abdominal cramps    Consultations: Communicated with neurosurgery via secure chat   Procedures/Studies: CT Thoracic Spine Wo Contrast  Result Date: 04/13/2021 CLINICAL DATA:  Provided history: Pain. Additional history provided: Patient reports trip and fall this morning. Pain in area of coccyx. EXAM: CT THORACIC SPINE WITHOUT CONTRAST TECHNIQUE: Multidetector CT images of the thoracic were obtained using the standard protocol without intravenous contrast. COMPARISON:  CT of the thoracic spine 02/05/2021. FINDINGS: Alignment: Thoracic dextrocurvature. Exaggerated thoracic kyphosis. No significant spondylolisthesis or bony retropulsion. Vertebrae: Unchanged mild T3  superior endplate compression deformity. Height loss at site of a T5 superior endplate compression fracture has slightly progressed as compared to the CT thoracic spine of 02/05/2021, now 20%. Interval T6 vertebral compression fracture (30% height loss). Progressive height loss at site of a T7 superior endplate vertebral compression fracture (now 30%). Unchanged severe T8 vertebral compression fracture. Progressive height loss at site of a T9 vertebral compression fracture (now approximately 70%). Interval height loss at site of a T10 vertebral compression fracture (now approximately 70%). Redemonstrated severe T11 vertebral compression fracture. Interval T12 compression fracture (20% height loss). Paraspinal and other soft tissues: Aortic atherosclerosis. No consolidation within the imaged lungs. Disc levels: Thoracic spondylosis without appreciable high-grade spinal canal stenosis. No compressive bony neural foraminal narrowing. IMPRESSION: 1. Comparison is made to the prior thoracic spine CT of 02/05/2021. 2. Unchanged mild T3 superior endplate vertebral compression deformity. 3. Height loss at site of a T5 superior endplate vertebral compression fracture has progressed (now 20%). 4. Interval T6 vertebral compression fracture (30% height loss). 5. Progressive height loss at site of a T7 superior endplate vertebral compression fracture (now 30%). 6. Unchanged severe T8 vertebral compression fracture. 7. Progressive height  loss at site of a T9 vertebral compression fracture (now approximately 70%). 8. Interval height loss at site of a T10 vertebral compression fracture (now approximately 70%). 9. Unchanged severe T11 vertebral compression fracture. 10. Interval T12 compression fracture (20% height loss). 11. No significant bony retropulsion. 12. Thoracic spondylosis, without appreciable high-grade spinal canal stenosis. No compressive bony neural foraminal narrowing. 13. Exaggerated thoracic kyphosis. 14. Thoracic  levocurvature. Electronically Signed   By: Jackey Loge D.O.   On: 04/13/2021 18:32   CT Lumbar Spine Wo Contrast  Result Date: 04/13/2021 CLINICAL DATA:  Pain. Additional history provided: Trip and fall today. Patient reports coccyx pain. EXAM: CT LUMBAR SPINE WITHOUT CONTRAST TECHNIQUE: Multidetector CT imaging of the lumbar spine was performed without intravenous contrast administration. Multiplanar CT image reconstructions were also generated. COMPARISON:  CT of the lumbar spine 02/05/2021. FINDINGS: Segmentation: 5 lumbar vertebrae. The caudal most well-formed intervertebral disc space designated L5-S1. Alignment: Trace bony retropulsion at the level of the L1 superior endplate mild thoracic dextrocurvature. 3 mm L4-L5 grade 1 anterolisthesis. Vertebrae: Unchanged L1 vertebral compression fracture (50% height loss). Unchanged trace bony retropulsion at the level of the L1 superior endplate. Unchanged subtle L2 superior endplate vertebral compression deformity. Slight progressive height loss at site of a mild L3 vertebral compression fracture. New L4 compression deformity (20% height loss). Mild L5 superior endplate compression deformity with possible subtle progressive height loss (now 20-30%). New mildly displaced acute fracture through the sacrum at the S1-S2 junction. Subtle kyphotic angulation at the fracture site. Please note the coccyx is not included in the field of view. Paraspinal and other soft tissues: Aortic atherosclerosis. Colonic diverticulosis. Paraspinal soft tissues unremarkable. Disc levels: No more than mild disc space narrowing at any level. Lumbar spondylosis, most notably as follows. At L4-L5, there is 3 mm grade 1 anterolisthesis. Disc uncovering with disc bulge. Facet arthrosis and ligamentum flavum hypertrophy. Bilateral subarticular stenosis with moderate central canal stenosis. Moderate bilateral neural foraminal narrowing. No appreciable significant central canal stenosis at the  remaining levels. IMPRESSION: 1. Comparison is made to the prior lumbar spine CT of 02/05/2021. 2. Unchanged L1 vertebral compression fracture (50% height loss) with unchanged trace bony retropulsion at the level of the L1 superior endplate. 3. Unchanged subtle L2 superior endplate vertebral compression fracture. 4. Slight progressive height loss at site of a mild L3 vertebral compression fracture. 5. New L4 vertebral compression fracture (20% height loss). 6. Mild L5 superior endplate vertebral compression fracture with likely subtle progressive height loss (now 20-30%). 7. New mildly displaced acute fracture through the sacrum at the S1-S2 junction. Subtle kyphotic angulation at the fracture site. Please note the coccyx is not included in the field of view. 8. Lumbar spondylosis. Most notably at L4-L5, there is 3 mm grade 1 anterolisthesis. Disc uncovering with disc bulge. Facet arthrosis and ligamentum flavum hypertrophy. Bilateral subarticular stenosis with moderate central canal stenosis. Moderate bilateral neural foraminal narrowing. 9. Lumbar dextrocurvature. 10.  Aortic Atherosclerosis (ICD10-I70.0). Electronically Signed   By: Jackey Loge D.O.   On: 04/13/2021 18:46      Subjective: Patient seen and examined at bedside.  No overnight fever or vomiting reported. Still complains of intermittent lower back pain but feels that pain is better controlled.  Discharge Exam: Vitals:   04/14/21 2142 04/15/21 0502  BP: (!) 156/67 (!) 162/69  Pulse: 78 77  Resp: 19 20  Temp: 99.1 F (37.3 C) 99.1 F (37.3 C)  SpO2: 94% 92%   General exam: No  distress.  On room air currently.  Sleepy, wakes up slightly and answer some questions. Respiratory system: Decreased breath sounds at bases bilaterally cardiovascular system: Rate controlled, S1-S2 heard gastrointestinal system: Abdomen is distended slightly, soft and nontender.  Bowel sounds are heard  extremities: No clubbing or edema    The results of  significant diagnostics from this hospitalization (including imaging, microbiology, ancillary and laboratory) are listed below for reference.     Microbiology: Recent Results (from the past 240 hour(s))  Resp Panel by RT-PCR (Flu A&B, Covid) Nasopharyngeal Swab     Status: None   Collection Time: 04/13/21  7:50 PM   Specimen: Nasopharyngeal Swab; Nasopharyngeal(NP) swabs in vial transport medium  Result Value Ref Range Status   SARS Coronavirus 2 by RT PCR NEGATIVE NEGATIVE Final    Comment: (NOTE) SARS-CoV-2 target nucleic acids are NOT DETECTED.  The SARS-CoV-2 RNA is generally detectable in upper respiratory specimens during the acute phase of infection. The lowest concentration of SARS-CoV-2 viral copies this assay can detect is 138 copies/mL. A negative result does not preclude SARS-Cov-2 infection and should not be used as the sole basis for treatment or other patient management decisions. A negative result may occur with  improper specimen collection/handling, submission of specimen other than nasopharyngeal swab, presence of viral mutation(s) within the areas targeted by this assay, and inadequate number of viral copies(<138 copies/mL). A negative result must be combined with clinical observations, patient history, and epidemiological information. The expected result is Negative.  Fact Sheet for Patients:  BloggerCourse.com  Fact Sheet for Healthcare Providers:  SeriousBroker.it  This test is no t yet approved or cleared by the [place] FDA and  has been authorized for detection and/or diagnosis of SARS-CoV-2 by FDA under an Emergency Use Authorization (EUA). This EUA will remain  in effect (meaning this test can be used) for the duration of the COVID-19 declaration under Section 564(b)(1) of the Act, 21 U.S.C.section 360bbb-3(b)(1), unless the authorization is terminated  or revoked sooner.       Influenza A by  PCR NEGATIVE NEGATIVE Final   Influenza B by PCR NEGATIVE NEGATIVE Final    Comment: (NOTE) The Xpert Xpress SARS-CoV-2/FLU/RSV plus assay is intended as an aid in the diagnosis of influenza from Nasopharyngeal swab specimens and should not be used as a sole basis for treatment. Nasal washings and aspirates are unacceptable for Xpert Xpress SARS-CoV-2/FLU/RSV testing.  Fact Sheet for Patients: BloggerCourse.com  Fact Sheet for Healthcare Providers: SeriousBroker.it  This test is not yet approved or cleared by the [place] FDA and has been authorized for detection and/or diagnosis of SARS-CoV-2 by FDA under an Emergency Use Authorization (EUA). This EUA will remain in effect (meaning this test can be used) for the duration of the COVID-19 declaration under Section 564(b)(1) of the Act, 21 U.S.C. section 360bbb-3(b)(1), unless the authorization is terminated or revoked.  Performed at [facility], [address], [place], [place] [address]      Labs: BNP (last 3 results) No results for input(s): BNP in the last 8760 hours. Basic Metabolic Panel: Recent Labs  Lab 04/13/21 1655 04/14/21 0325  NA 133* 138  K 3.3* 3.2*  CL 97* 104  CO2 26 24  GLUCOSE 109* 88  BUN 10 9  CREATININE 0.45 0.36*  CALCIUM 9.5 8.8*   Liver Function Tests: Recent Labs  Lab 04/13/21 1655 04/14/21 0325  AST 60* 51*  ALT 71* 59*  ALKPHOS 636* 549*  BILITOT 0.6 0.6  PROT 6.8 5.8*  ALBUMIN 3.5 2.7*   No results for input(s): LIPASE, AMYLASE in the last 168 hours. No results for input(s): AMMONIA in the last 168 hours. CBC: Recent Labs  Lab 04/13/21 1655 04/14/21 0325  WBC 6.2 4.1  NEUTROABS 5.0  --   HGB 10.6* 8.9*  HCT 31.8* 26.4*  MCV 106.4* 107.3*  PLT 378 318   Cardiac Enzymes: No results for input(s): CKTOTAL, CKMB, CKMBINDEX, TROPONINI in the last 168 hours. BNP: Invalid input(s): POCBNP CBG: No  results for input(s): GLUCAP in the last 168 hours. D-Dimer No results for input(s): DDIMER in the last 72 hours. Hgb A1c No results for input(s): HGBA1C in the last 72 hours. Lipid Profile No results for input(s): CHOL, HDL, LDLCALC, TRIG, CHOLHDL, LDLDIRECT in the last 72 hours. Thyroid function studies No results for input(s): TSH, T4TOTAL, T3FREE, THYROIDAB in the last 72 hours.  Invalid input(s): FREET3 Anemia work up Recent Labs    04/15/21 0336  VITAMINB12 740   Urinalysis    Component Value Date/Time   COLORURINE yellow 07/19/2007 0851   APPEARANCEUR Clear 07/19/2007 0851   LABSPEC >=1.030 07/19/2007 0851   PHURINE 5.0 07/19/2007 0851   HGBUR negative 07/19/2007 0851   BILIRUBINUR n 03/18/2016 1328   PROTEINUR n 03/18/2016 1328   UROBILINOGEN negative 03/18/2016 1328   UROBILINOGEN 0.2 07/19/2007 0851   NITRITE n 03/18/2016 1328   NITRITE negative 07/19/2007 0851   LEUKOCYTESUR Negative 03/18/2016 1328   Sepsis Labs Invalid input(s): PROCALCITONIN,  WBC,  LACTICIDVEN Microbiology Recent Results (from the past 240 hour(s))  Resp Panel by RT-PCR (Flu A&B, Covid) Nasopharyngeal Swab     Status: None   Collection Time: 04/13/21  7:50 PM   Specimen: Nasopharyngeal Swab; Nasopharyngeal(NP) swabs in vial transport medium  Result Value Ref Range Status   SARS Coronavirus 2 by RT PCR NEGATIVE NEGATIVE Final    Comment: (NOTE) SARS-CoV-2 target nucleic acids are NOT DETECTED.  The SARS-CoV-2 RNA is generally detectable in upper respiratory specimens during the acute phase of infection. The lowest concentration of SARS-CoV-2 viral copies this assay can detect is 138 copies/mL. A negative result does not preclude SARS-Cov-2 infection and should not be used as the sole basis for treatment or other patient management decisions. A negative result may occur with  improper specimen collection/handling, submission of specimen other than nasopharyngeal swab, presence of viral  mutation(s) within the areas targeted by this assay, and inadequate number of viral copies(<138 copies/mL). A negative result must be combined with clinical observations, patient history, and epidemiological information. The expected result is Negative.  Fact Sheet for Patients:  BloggerCourse.com  Fact Sheet for Healthcare Providers:  SeriousBroker.it  This test is no t yet approved or cleared by the [place] FDA and  has been authorized for detection and/or diagnosis of SARS-CoV-2 by FDA under an Emergency Use Authorization (EUA). This EUA will remain  in effect (meaning this test can be used) for the duration of the COVID-19 declaration under Section 564(b)(1) of the Act, 21 U.S.C.section 360bbb-3(b)(1), unless the authorization is terminated  or revoked sooner.       Influenza A by PCR NEGATIVE NEGATIVE Final   Influenza B by PCR NEGATIVE NEGATIVE Final    Comment: (NOTE) The Xpert Xpress SARS-CoV-2/FLU/RSV plus assay is intended as an aid in the diagnosis of influenza from Nasopharyngeal swab specimens and should not be used as a sole basis for treatment. Nasal washings and aspirates are unacceptable for Xpert Xpress SARS-CoV-2/FLU/RSV  testing.  Fact Sheet for Patients: BloggerCourse.com  Fact Sheet for Healthcare Providers: SeriousBroker.it  This test is not yet approved or cleared by the [place] FDA and has been authorized for detection and/or diagnosis of SARS-CoV-2 by FDA under an Emergency Use Authorization (EUA). This EUA will remain in effect (meaning this test can be used) for the duration of the COVID-19 declaration under Section 564(b)(1) of the Act, 21 U.S.C. section 360bbb-3(b)(1), unless the authorization is terminated or revoked.  Performed at [facility], [address], [place], [place] [address]      Time  coordinating discharge: 35 minutes  SIGNED:   Glade Lloyd, MD  [facility] 04/15/2021, 11:49 AM

## 2021-04-15 NOTE — Care Management CC44 (Signed)
Condition Code 44 Documentation Completed  Patient Details  Name: Natasha Harper MRN: [id number] Date of Birth: 1934-08-08   Condition Code 44 given:  Yes Patient signature on Condition Code 44 notice:  Yes Documentation of 2 MD's agreement:  Yes Code 44 added to claim:  Yes    Ewing Schlein, LCSW 04/15/2021, 9:46 AM

## 2021-04-15 NOTE — Care Management Obs Status (Signed)
MEDICARE OBSERVATION STATUS NOTIFICATION   Patient Details  Name: Natasha Harper MRN: [id number] Date of Birth: 1935/06/03   Medicare Observation Status Notification Given:  Yes    Ewing Schlein, LCSW 04/15/2021, 9:46 AM

## 2021-04-15 NOTE — Progress Notes (Signed)
Patient ID: Natasha Harper, female   DOB: 1934-11-11, 86 y.o.   MRN: [id number]  PROGRESS NOTE    Natasha Harper  UVO:[id number] DOB: [birth year] DOA: 04/13/2021 PCP: [name], MD   Brief Narrative:  86 y.o. female with medical history significant of coronary artery disease, hyperlipidemia, chronic dysfunction CHF, essential hypertension, hyperlipidemia, hypothyroidism, compression vertebral fractures presented with mechanical fall at home with worsening back pain.  On presentation, CT scan of the lumbar and thoracic spine showed multiple old compression fractures with possibility of questionable new L4 fracture along with sacral fracture.  ED provider communicated with neurosurgeon on-call who recommended conservative management and TLSO brace.  Patient refused brace.  She was admitted for pain management.  Assessment & Plan:   Possible mechanical fall with multiple thoracic and lumbar vertebral compression fractures along with possible sacral fracture at S1-S2 -Patient has chronic vertebral compression fractures and might have new L4 fracture along with sacral fracture as per imaging. -ED provider communicated with neurosurgeon on-call who recommended conservative management and TLSO brace.  Patient refused brace stating that brace did not help last time and made pain worse.  -PT/OT eval pending.  Pain management.  Wants to avoid narcotics as much as possible.  Bowel regimen. -I communicated with Dr. Dawley/neurosurgery via secure chat who recommended conservative management and patient is not a candidate for kyphoplasty  Hypothyroidism--continue levothyroxine  Hyperlipidemia -Continue atorvastatin  Essential hypertension -Continue amlodipine  Anemia of chronic disease Macrocytosis -hemoglobin stable.   -Continue empiric oral folic acid supplementation.  Vitamin B12 level normal.  Hyponatremia -Treated with IV fluids.  Resolved.  DC'd IV  fluids.  Hypokalemia -Replaced.  DVT prophylaxis: Lovenox Code Status: Full Family Communication: Husband at bedside on 04/14/2021 Disposition Plan: Status is: Observation  Dispo: The patient is from: Home              Anticipated d/c is to: Home              Patient currently is not medically stable to d/c.   Difficult to place patient No  Consultants: Communicated with neurosurgery via secure chat  Procedures: None  Antimicrobials: None   Subjective: Patient seen and examined at bedside.  Has not worked with physical therapy.  Still complains of intermittent lower back pain but feels that pain is better controlled.  No overnight fever or vomiting reported. Objective: Vitals:   04/14/21 0241 04/14/21 1420 04/14/21 2142 04/15/21 0502  BP: (!) 160/69 (!) 152/65 (!) 156/67 (!) 162/69  Pulse: 84 79 78 77  Resp: 17 18 19 20   Temp: 98.5 F (36.9 C) 97.7 F (36.5 C) 99.1 F (37.3 C) 99.1 F (37.3 C)  TempSrc:  Oral Oral Oral  SpO2: 93% 92% 94% 92%  Weight:      Height:        Intake/Output Summary (Last 24 hours) at 04/15/2021 0941 Last data filed at 04/15/2021 0034 Gross per 24 hour  Intake 766.9 ml  Output 350 ml  Net 416.9 ml    Filed Weights   04/13/21 2145  Weight: 46 kg    Examination:  General exam: No distress.  On room air currently.  Sleepy, wakes up slightly and answer some questions. Respiratory system: Decreased breath sounds at bases bilaterally cardiovascular system: Rate controlled, S1-S2 heard gastrointestinal system: Abdomen is distended slightly, soft and nontender.  Bowel sounds are heard  extremities: No clubbing or edema   Data Reviewed: I have personally reviewed following labs and imaging  studies  CBC: Recent Labs  Lab 04/13/21 1655 04/14/21 0325  WBC 6.2 4.1  NEUTROABS 5.0  --   HGB 10.6* 8.9*  HCT 31.8* 26.4*  MCV 106.4* 107.3*  PLT 378 318    Basic Metabolic Panel: Recent Labs  Lab 04/13/21 1655 04/14/21 0325  NA  133* 138  K 3.3* 3.2*  CL 97* 104  CO2 26 24  GLUCOSE 109* 88  BUN 10 9  CREATININE 0.45 0.36*  CALCIUM 9.5 8.8*    GFR: Estimated Creatinine Clearance: 38.7 mL/min (A) (by C-G formula based on SCr of 0.36 mg/dL (L)). Liver Function Tests: Recent Labs  Lab 04/13/21 1655 04/14/21 0325  AST 60* 51*  ALT 71* 59*  ALKPHOS 636* 549*  BILITOT 0.6 0.6  PROT 6.8 5.8*  ALBUMIN 3.5 2.7*    No results for input(s): LIPASE, AMYLASE in the last 168 hours. No results for input(s): AMMONIA in the last 168 hours. Coagulation Profile: No results for input(s): INR, PROTIME in the last 168 hours. Cardiac Enzymes: No results for input(s): CKTOTAL, CKMB, CKMBINDEX, TROPONINI in the last 168 hours. BNP (last 3 results) No results for input(s): PROBNP in the last 8760 hours. HbA1C: No results for input(s): HGBA1C in the last 72 hours. CBG: No results for input(s): GLUCAP in the last 168 hours. Lipid Profile: No results for input(s): CHOL, HDL, LDLCALC, TRIG, CHOLHDL, LDLDIRECT in the last 72 hours. Thyroid Function Tests: No results for input(s): TSH, T4TOTAL, FREET4, T3FREE, THYROIDAB in the last 72 hours. Anemia Panel: Recent Labs    04/15/21 0336  VITAMINB12 740   Sepsis Labs: No results for input(s): PROCALCITON, LATICACIDVEN in the last 168 hours.  Recent Results (from the past 240 hour(s))  Resp Panel by RT-PCR (Flu A&B, Covid) Nasopharyngeal Swab     Status: None   Collection Time: 04/13/21  7:50 PM   Specimen: Nasopharyngeal Swab; Nasopharyngeal(NP) swabs in vial transport medium  Result Value Ref Range Status   SARS Coronavirus 2 by RT PCR NEGATIVE NEGATIVE Final    Comment: (NOTE) SARS-CoV-2 target nucleic acids are NOT DETECTED.  The SARS-CoV-2 RNA is generally detectable in upper respiratory specimens during the acute phase of infection. The lowest concentration of SARS-CoV-2 viral copies this assay can detect is 138 copies/mL. A negative result does not preclude  SARS-Cov-2 infection and should not be used as the sole basis for treatment or other patient management decisions. A negative result may occur with  improper specimen collection/handling, submission of specimen other than nasopharyngeal swab, presence of viral mutation(s) within the areas targeted by this assay, and inadequate number of viral copies(<138 copies/mL). A negative result must be combined with clinical observations, patient history, and epidemiological information. The expected result is Negative.  Fact Sheet for Patients:  BloggerCourse.com  Fact Sheet for Healthcare Providers:  SeriousBroker.it  This test is no t yet approved or cleared by the [place] FDA and  has been authorized for detection and/or diagnosis of SARS-CoV-2 by FDA under an Emergency Use Authorization (EUA). This EUA will remain  in effect (meaning this test can be used) for the duration of the COVID-19 declaration under Section 564(b)(1) of the Act, 21 U.S.C.section 360bbb-3(b)(1), unless the authorization is terminated  or revoked sooner.       Influenza A by PCR NEGATIVE NEGATIVE Final   Influenza B by PCR NEGATIVE NEGATIVE Final    Comment: (NOTE) The Xpert Xpress SARS-CoV-2/FLU/RSV plus assay is intended as an aid in the diagnosis of influenza  from Nasopharyngeal swab specimens and should not be used as a sole basis for treatment. Nasal washings and aspirates are unacceptable for Xpert Xpress SARS-CoV-2/FLU/RSV testing.  Fact Sheet for Patients: BloggerCourse.com  Fact Sheet for Healthcare Providers: SeriousBroker.it  This test is not yet approved or cleared by the [place] FDA and has been authorized for detection and/or diagnosis of SARS-CoV-2 by FDA under an Emergency Use Authorization (EUA). This EUA will remain in effect (meaning this test can be used) for the duration of  the COVID-19 declaration under Section 564(b)(1) of the Act, 21 U.S.C. section 360bbb-3(b)(1), unless the authorization is terminated or revoked.  Performed at [facility], [address] [place], [place] [address]           Radiology Studies: CT Thoracic Spine Wo Contrast  Result Date: 04/13/2021 CLINICAL DATA:  Provided history: Pain. Additional history provided: Patient reports trip and fall this morning. Pain in area of coccyx. EXAM: CT THORACIC SPINE WITHOUT CONTRAST TECHNIQUE: Multidetector CT images of the thoracic were obtained using the standard protocol without intravenous contrast. COMPARISON:  CT of the thoracic spine 02/05/2021. FINDINGS: Alignment: Thoracic dextrocurvature. Exaggerated thoracic kyphosis. No significant spondylolisthesis or bony retropulsion. Vertebrae: Unchanged mild T3 superior endplate compression deformity. Height loss at site of a T5 superior endplate compression fracture has slightly progressed as compared to the CT thoracic spine of 02/05/2021, now 20%. Interval T6 vertebral compression fracture (30% height loss). Progressive height loss at site of a T7 superior endplate vertebral compression fracture (now 30%). Unchanged severe T8 vertebral compression fracture. Progressive height loss at site of a T9 vertebral compression fracture (now approximately 70%). Interval height loss at site of a T10 vertebral compression fracture (now approximately 70%). Redemonstrated severe T11 vertebral compression fracture. Interval T12 compression fracture (20% height loss). Paraspinal and other soft tissues: Aortic atherosclerosis. No consolidation within the imaged lungs. Disc levels: Thoracic spondylosis without appreciable high-grade spinal canal stenosis. No compressive bony neural foraminal narrowing. IMPRESSION: 1. Comparison is made to the prior thoracic spine CT of 02/05/2021. 2. Unchanged mild T3 superior endplate vertebral compression deformity.  3. Height loss at site of a T5 superior endplate vertebral compression fracture has progressed (now 20%). 4. Interval T6 vertebral compression fracture (30% height loss). 5. Progressive height loss at site of a T7 superior endplate vertebral compression fracture (now 30%). 6. Unchanged severe T8 vertebral compression fracture. 7. Progressive height loss at site of a T9 vertebral compression fracture (now approximately 70%). 8. Interval height loss at site of a T10 vertebral compression fracture (now approximately 70%). 9. Unchanged severe T11 vertebral compression fracture. 10. Interval T12 compression fracture (20% height loss). 11. No significant bony retropulsion. 12. Thoracic spondylosis, without appreciable high-grade spinal canal stenosis. No compressive bony neural foraminal narrowing. 13. Exaggerated thoracic kyphosis. 14. Thoracic levocurvature. Electronically Signed   By: Jackey Loge D.O.   On: 04/13/2021 18:32   CT Lumbar Spine Wo Contrast  Result Date: 04/13/2021 CLINICAL DATA:  Pain. Additional history provided: Trip and fall today. Patient reports coccyx pain. EXAM: CT LUMBAR SPINE WITHOUT CONTRAST TECHNIQUE: Multidetector CT imaging of the lumbar spine was performed without intravenous contrast administration. Multiplanar CT image reconstructions were also generated. COMPARISON:  CT of the lumbar spine 02/05/2021. FINDINGS: Segmentation: 5 lumbar vertebrae. The caudal most well-formed intervertebral disc space designated L5-S1. Alignment: Trace bony retropulsion at the level of the L1 superior endplate mild thoracic dextrocurvature. 3 mm L4-L5 grade 1 anterolisthesis. Vertebrae: Unchanged L1 vertebral compression fracture (50% height  loss). Unchanged trace bony retropulsion at the level of the L1 superior endplate. Unchanged subtle L2 superior endplate vertebral compression deformity. Slight progressive height loss at site of a mild L3 vertebral compression fracture. New L4 compression deformity  (20% height loss). Mild L5 superior endplate compression deformity with possible subtle progressive height loss (now 20-30%). New mildly displaced acute fracture through the sacrum at the S1-S2 junction. Subtle kyphotic angulation at the fracture site. Please note the coccyx is not included in the field of view. Paraspinal and other soft tissues: Aortic atherosclerosis. Colonic diverticulosis. Paraspinal soft tissues unremarkable. Disc levels: No more than mild disc space narrowing at any level. Lumbar spondylosis, most notably as follows. At L4-L5, there is 3 mm grade 1 anterolisthesis. Disc uncovering with disc bulge. Facet arthrosis and ligamentum flavum hypertrophy. Bilateral subarticular stenosis with moderate central canal stenosis. Moderate bilateral neural foraminal narrowing. No appreciable significant central canal stenosis at the remaining levels. IMPRESSION: 1. Comparison is made to the prior lumbar spine CT of 02/05/2021. 2. Unchanged L1 vertebral compression fracture (50% height loss) with unchanged trace bony retropulsion at the level of the L1 superior endplate. 3. Unchanged subtle L2 superior endplate vertebral compression fracture. 4. Slight progressive height loss at site of a mild L3 vertebral compression fracture. 5. New L4 vertebral compression fracture (20% height loss). 6. Mild L5 superior endplate vertebral compression fracture with likely subtle progressive height loss (now 20-30%). 7. New mildly displaced acute fracture through the sacrum at the S1-S2 junction. Subtle kyphotic angulation at the fracture site. Please note the coccyx is not included in the field of view. 8. Lumbar spondylosis. Most notably at L4-L5, there is 3 mm grade 1 anterolisthesis. Disc uncovering with disc bulge. Facet arthrosis and ligamentum flavum hypertrophy. Bilateral subarticular stenosis with moderate central canal stenosis. Moderate bilateral neural foraminal narrowing. 9. Lumbar dextrocurvature. 10.  Aortic  Atherosclerosis (ICD10-I70.0). Electronically Signed   By: Jackey Loge D.O.   On: 04/13/2021 18:46        Scheduled Meds:  amLODipine  5 mg Oral Daily   atorvastatin  80 mg Oral Daily   enoxaparin (LOVENOX) injection  40 mg Subcutaneous Q24H   folic acid  1 mg Oral Daily   influenza vaccine adjuvanted  0.5 mL Intramuscular Tomorrow-1000   levothyroxine  50 mcg Oral Q0600   lidocaine  1 patch Transdermal Q24H   potassium chloride  40 mEq Oral Once   Continuous Infusions:          Glade Lloyd, MD [facility] 04/15/2021, 9:41 AM

## 2021-04-15 NOTE — TOC Transition Note (Signed)
Transition of Care [facility]) - CM/SW Discharge Note  Patient Details  Name: KAMILYA WAKEMAN MRN: [id number] Date of Birth: Aug 27, 1934  Transition of [facility]) CM/SW Contact:  Ewing Schlein, LCSW Phone Number: 04/15/2021, 12:29 PM  Clinical Narrative: PT evaluation recommended HHPT. Patient and husband are agreeable to referral. Husband reported they have used [facility] before and would prefer a different agency. They are agreeable to being referred to [facility]. CSW made HHPT referral to [facility] with [facility]. HH orders are in. TOC signing off.  Final next level of care: Home w Home Health Services Barriers to Discharge: No Barriers Identified  Patient Goals and CMS Choice Patient states their goals for this hospitalization and ongoing recovery are:: Discharge home with PT CMS Medicare.gov Compare Post Acute Care list provided to:: Patient Represenative (must comment) Choice offered to / list presented to : Spouse, Patient  Discharge Plan and Services         DME Arranged: N/A DME Agency: NA HH Arranged: PT HH Agency: [facility] Date HH Agency Contacted: 04/15/21 Time HH Agency Contacted: 1223 Representative spoke with at [facility] Agency: Pearson Grippe  Readmission Risk Interventions No flowsheet data found.

## 2021-04-15 NOTE — Evaluation (Addendum)
Physical Therapy Evaluation Patient Details Name: Natasha Harper MRN: [id number] DOB: 08/19/1934 Today's Date: 04/15/2021  History of Present Illness  86 y.o. female  presented with mechanical fall at home with worsening back pain.  On presentation, CT scan of the lumbar and thoracic spine showed multiple old compression fractures with possibility of questionable new L4 fracture along with sacral fracture. Pt had a recent admission  August 2-Aug 7 for falls and multiple thoracic and lumbar compression fractures. Pt was DCed with a TLSO but pt stated she's not been able to tolerate wearing it.  Pt with medical history significant of coronary artery disease, hyperlipidemia, chronic dysfunction CHF, essential hypertension, hyperlipidemia, hypothyroidism, compression vertebral fractures.  Clinical Impression  Pt admitted with above diagnosis. Pt ambulated 150' with RW, no loss of balance. Pt has h/o multiple recent falls, pt's spouse has physical issues that limit his ability to care for her. They both are interested in ST-SNF to address balance deficits.  Pt currently with functional limitations due to the deficits listed below (see PT Problem List). Pt will benefit from skilled PT to increase their independence and safety with mobility to allow discharge to the venue listed below.          Recommendations for follow up therapy are one component of a multi-disciplinary discharge planning process, led by the attending physician.  Recommendations may be updated based on patient status, additional functional criteria and insurance authorization.  Follow Up Recommendations Supervision for mobility/OOB;SNF if family unable to provide supervision for mobility. HHPT vs outpt PT for balance training if family can manage her care at home.     Equipment Recommendations  None recommended by PT    Recommendations for Other Services       Precautions / Restrictions Precautions Precautions:  Fall;Back Precaution Comments: multiple recent falls Required Braces or Orthoses: Spinal Brace Spinal Brace: Thoracolumbosacral orthotic (pt was issued a TLSO during August admission, she reports she cannot tolerate wearing it.) Restrictions Weight Bearing Restrictions: No Other Position/Activity Restrictions: pt was issued a TLSO during August admission, she reports she cannot tolerate wearing it.      Mobility  Bed Mobility Overal bed mobility: Needs Assistance Bed Mobility: Rolling;Sidelying to Sit Rolling: Supervision Sidelying to sit: Min assist       General bed mobility comments: VCs for log roll technique    Transfers Overall transfer level: Needs assistance Equipment used: Rolling walker (2 wheeled) Transfers: Sit to/from Stand Sit to Stand: Min assist         General transfer comment: assist to power up  Ambulation/Gait Ambulation/Gait assistance: Min guard Gait Distance (Feet): 150 Feet Assistive device: Rolling walker (2 wheeled) Gait Pattern/deviations: Step-through pattern;Trunk flexed Gait velocity: decr   General Gait Details: VCs for posture and proximity to RW, pt reports "dull" pain in mid back, no loss of balance, min guard due to multiple recent falls  Stairs            Wheelchair Mobility    Modified Rankin (Stroke Patients Only)       Balance Overall balance assessment: Needs assistance;History of Falls Sitting-balance support: Feet supported;No upper extremity supported Sitting balance-[name]: Good       Standing balance-[name]: Fair Standing balance comment: relies on BUE support                             Pertinent Vitals/Pain Pain Assessment: Faces Faces Pain Scale: Hurts little more  Pain Location: mid back Pain Descriptors / Indicators: Dull Pain Intervention(s): Limited activity within patient's tolerance;Monitored during session;Repositioned    Home Living Family/patient expects to be  discharged to:: Private residence Living Arrangements: Spouse/significant other Available Help at Discharge: Family;Available 24 hours/day Type of Home: House Home Access: Level entry     Home Layout: One level Home Equipment: Walker - 2 wheels Additional Comments: spouse has a hernia and a "bad knee" so can provide only limited assistance    Prior Function Level of Independence: Independent with assistive device(s)         Comments: uses walker at home     Hand Dominance   Dominant Hand: Right    Extremity/Trunk Assessment   Upper Extremity Assessment Upper Extremity Assessment: Overall WFL for tasks assessed    Lower Extremity Assessment Lower Extremity Assessment: Generalized weakness (B knee ext -4/5)    Cervical / Trunk Assessment Cervical / Trunk Assessment: Kyphotic  Communication   Communication: HOH  Cognition Arousal/Alertness: Awake/alert Behavior During Therapy: WFL for tasks assessed/performed Overall Cognitive Status: Within Functional Limits for tasks assessed                                        General Comments      Exercises     Assessment/Plan    PT Assessment Patient needs continued PT services  PT Problem List Decreased activity tolerance;Pain;Decreased balance       PT Treatment Interventions Gait training;DME instruction;Therapeutic activities;Therapeutic exercise;Functional mobility training;Patient/family education    PT Goals (Current goals can be found in the Care Plan section)  Acute Rehab PT Goals Patient Stated Goal: to stop falling PT Goal Formulation: With patient/family Time For Goal Achievement: 04/29/21 Potential to Achieve Goals: Good    Frequency Min 3X/week   Barriers to discharge        Co-evaluation               AM-PAC PT "6 Clicks" Mobility  Outcome Measure Help needed turning from your back to your side while in a flat bed without using bedrails?: A Little Help needed moving  from lying on your back to sitting on the side of a flat bed without using bedrails?: A Little Help needed moving to and from a bed to a chair (including a wheelchair)?: A Little Help needed standing up from a chair using your arms (e.g., wheelchair or bedside chair)?: A Little Help needed to walk in hospital room?: A Little Help needed climbing 3-5 steps with a railing? : A Little 6 Click Score: 18    End of Session Equipment Utilized During Treatment: Gait belt Activity Tolerance: Patient tolerated treatment well Patient left: in chair;with call bell/phone within reach;with chair alarm set Nurse Communication: Mobility status PT Visit Diagnosis: Difficulty in walking, not elsewhere classified (R26.2);History of falling (Z91.81);Repeated falls (R29.6)    Time: 5643-3295 PT Time Calculation (min) (ACUTE ONLY): 31 min   Charges:   PT Evaluation $PT Eval Moderate Complexity: 1 Mod PT Treatments $Gait Training: 8-22 mins       Ralene Bathe Kistler PT 04/15/2021  Acute Rehabilitation Services Pager [phone] Office [phone]

## 2021-04-17 ENCOUNTER — Telehealth: Payer: Self-pay | Admitting: Internal Medicine

## 2021-04-17 NOTE — Telephone Encounter (Signed)
Linford Arnold PT will adv home health is calling to let dr Fabian Sharp know the patient is refusing physical therapy that was order from [facility]. Pt was discharge on 04-15-2021

## 2021-04-19 ENCOUNTER — Telehealth: Payer: Self-pay

## 2021-04-19 NOTE — Telephone Encounter (Signed)
Transition Care Management Unsuccessful Follow-up Telephone Call  Date of discharge and from where:  Natasha Harper 04/15/2021  Attempts:  1st Attempt  Reason for unsuccessful TCM follow-up call:  Unable to reach patient

## 2021-04-23 NOTE — Telephone Encounter (Signed)
Transition Care Management Unsuccessful Follow-up Telephone Call  Date of discharge and from where:  [facility] 04/15/2021  Attempts:  2nd Attempt  Reason for unsuccessful TCM follow-up call:  Unable to leave message

## 2021-04-24 ENCOUNTER — Ambulatory Visit: Payer: Medicare Other | Admitting: Internal Medicine

## 2021-04-24 NOTE — Telephone Encounter (Signed)
Transition Care Management Unsuccessful Follow-up Telephone Call  Date of discharge and from where:  [facility] 04/15/2021  Attempts:  3rd Attempt  Reason for unsuccessful TCM follow-up call:  Unable to reach patient

## 2021-06-04 ENCOUNTER — Telehealth: Payer: Self-pay | Admitting: Internal Medicine

## 2021-06-04 NOTE — Chronic Care Management (AMB) (Signed)
  Chronic Care Management   Outreach Note  06/04/2021 Name: Natasha Harper MRN: [id number] DOB: [birth year]  Referred by: [name], MD Reason for referral : No chief complaint on file.   An unsuccessful telephone outreach was attempted today. The patient was referred to the pharmacist for assistance with care management and care coordination.   Follow Up Plan:  Tatjana Dellinger Upstream Scheduler

## 2021-06-25 DIAGNOSIS — R0789 Other chest pain: Secondary | ICD-10-CM | POA: Diagnosis not present

## 2021-06-25 DIAGNOSIS — M546 Pain in thoracic spine: Secondary | ICD-10-CM | POA: Diagnosis not present

## 2021-06-25 DIAGNOSIS — R778 Other specified abnormalities of plasma proteins: Secondary | ICD-10-CM | POA: Diagnosis not present

## 2021-07-04 ENCOUNTER — Emergency Department (HOSPITAL_COMMUNITY): Payer: Medicare Other

## 2021-07-04 ENCOUNTER — Inpatient Hospital Stay (HOSPITAL_COMMUNITY)
Admission: EM | Admit: 2021-07-04 | Discharge: 2021-07-24 | DRG: 478 | Disposition: A | Discharge status: Home Health | Payer: Medicare Other | Attending: Internal Medicine | Admitting: Internal Medicine

## 2021-07-04 DIAGNOSIS — M549 Dorsalgia, unspecified: Secondary | ICD-10-CM | POA: Diagnosis present

## 2021-07-04 DIAGNOSIS — N6019 Diffuse cystic mastopathy of unspecified breast: Secondary | ICD-10-CM | POA: Diagnosis present

## 2021-07-04 DIAGNOSIS — I2510 Atherosclerotic heart disease of native coronary artery without angina pectoris: Secondary | ICD-10-CM | POA: Diagnosis present

## 2021-07-04 DIAGNOSIS — M79606 Pain in leg, unspecified: Secondary | ICD-10-CM | POA: Diagnosis present

## 2021-07-04 DIAGNOSIS — S22000A Wedge compression fracture of unspecified thoracic vertebra, initial encounter for closed fracture: Secondary | ICD-10-CM | POA: Diagnosis not present

## 2021-07-04 DIAGNOSIS — Z79899 Other long term (current) drug therapy: Secondary | ICD-10-CM

## 2021-07-04 DIAGNOSIS — E7800 Pure hypercholesterolemia, unspecified: Secondary | ICD-10-CM | POA: Diagnosis present

## 2021-07-04 DIAGNOSIS — S22050A Wedge compression fracture of T5-T6 vertebra, initial encounter for closed fracture: Secondary | ICD-10-CM | POA: Diagnosis not present

## 2021-07-04 DIAGNOSIS — M4316 Spondylolisthesis, lumbar region: Secondary | ICD-10-CM | POA: Diagnosis not present

## 2021-07-04 DIAGNOSIS — E8770 Fluid overload, unspecified: Secondary | ICD-10-CM | POA: Diagnosis present

## 2021-07-04 DIAGNOSIS — R54 Age-related physical debility: Secondary | ICD-10-CM | POA: Diagnosis not present

## 2021-07-04 DIAGNOSIS — I2489 Other forms of acute ischemic heart disease: Secondary | ICD-10-CM

## 2021-07-04 DIAGNOSIS — R296 Repeated falls: Secondary | ICD-10-CM | POA: Diagnosis present

## 2021-07-04 DIAGNOSIS — M8008XA Age-related osteoporosis with current pathological fracture, vertebra(e), initial encounter for fracture: Secondary | ICD-10-CM | POA: Diagnosis not present

## 2021-07-04 DIAGNOSIS — G8929 Other chronic pain: Secondary | ICD-10-CM | POA: Diagnosis present

## 2021-07-04 DIAGNOSIS — R079 Chest pain, unspecified: Secondary | ICD-10-CM | POA: Diagnosis not present

## 2021-07-04 DIAGNOSIS — R072 Precordial pain: Secondary | ICD-10-CM

## 2021-07-04 DIAGNOSIS — R000 Tachycardia, unspecified: Secondary | ICD-10-CM | POA: Diagnosis not present

## 2021-07-04 DIAGNOSIS — G319 Degenerative disease of nervous system, unspecified: Secondary | ICD-10-CM | POA: Diagnosis not present

## 2021-07-04 DIAGNOSIS — R778 Other specified abnormalities of plasma proteins: Secondary | ICD-10-CM | POA: Diagnosis not present

## 2021-07-04 DIAGNOSIS — M546 Pain in thoracic spine: Secondary | ICD-10-CM

## 2021-07-04 DIAGNOSIS — Z87891 Personal history of nicotine dependence: Secondary | ICD-10-CM | POA: Diagnosis not present

## 2021-07-04 DIAGNOSIS — Z8262 Family history of osteoporosis: Secondary | ICD-10-CM

## 2021-07-04 DIAGNOSIS — H9190 Unspecified hearing loss, unspecified ear: Secondary | ICD-10-CM | POA: Diagnosis present

## 2021-07-04 DIAGNOSIS — I10 Essential (primary) hypertension: Secondary | ICD-10-CM

## 2021-07-04 DIAGNOSIS — D539 Nutritional anemia, unspecified: Secondary | ICD-10-CM | POA: Diagnosis not present

## 2021-07-04 DIAGNOSIS — J9811 Atelectasis: Secondary | ICD-10-CM | POA: Diagnosis not present

## 2021-07-04 DIAGNOSIS — W19XXXA Unspecified fall, initial encounter: Secondary | ICD-10-CM | POA: Diagnosis not present

## 2021-07-04 DIAGNOSIS — Z955 Presence of coronary angioplasty implant and graft: Secondary | ICD-10-CM

## 2021-07-04 DIAGNOSIS — S0990XA Unspecified injury of head, initial encounter: Secondary | ICD-10-CM | POA: Diagnosis not present

## 2021-07-04 DIAGNOSIS — Z681 Body mass index (BMI) 19 or less, adult: Secondary | ICD-10-CM

## 2021-07-04 DIAGNOSIS — Z8249 Family history of ischemic heart disease and other diseases of the circulatory system: Secondary | ICD-10-CM

## 2021-07-04 DIAGNOSIS — R64 Cachexia: Secondary | ICD-10-CM | POA: Diagnosis not present

## 2021-07-04 DIAGNOSIS — E039 Hypothyroidism, unspecified: Secondary | ICD-10-CM | POA: Diagnosis present

## 2021-07-04 DIAGNOSIS — I248 Other forms of acute ischemic heart disease: Secondary | ICD-10-CM | POA: Diagnosis not present

## 2021-07-04 DIAGNOSIS — R748 Abnormal levels of other serum enzymes: Secondary | ICD-10-CM | POA: Diagnosis not present

## 2021-07-04 DIAGNOSIS — Z20822 Contact with and (suspected) exposure to covid-19: Secondary | ICD-10-CM | POA: Diagnosis not present

## 2021-07-04 DIAGNOSIS — M545 Low back pain, unspecified: Secondary | ICD-10-CM | POA: Diagnosis not present

## 2021-07-04 DIAGNOSIS — R0902 Hypoxemia: Secondary | ICD-10-CM | POA: Diagnosis not present

## 2021-07-04 DIAGNOSIS — I252 Old myocardial infarction: Secondary | ICD-10-CM | POA: Diagnosis not present

## 2021-07-04 DIAGNOSIS — E871 Hypo-osmolality and hyponatremia: Secondary | ICD-10-CM | POA: Diagnosis not present

## 2021-07-04 DIAGNOSIS — I214 Non-ST elevation (NSTEMI) myocardial infarction: Secondary | ICD-10-CM | POA: Diagnosis not present

## 2021-07-04 DIAGNOSIS — S22010A Wedge compression fracture of first thoracic vertebra, initial encounter for closed fracture: Secondary | ICD-10-CM | POA: Diagnosis not present

## 2021-07-04 DIAGNOSIS — R531 Weakness: Secondary | ICD-10-CM | POA: Diagnosis not present

## 2021-07-04 DIAGNOSIS — R7989 Other specified abnormal findings of blood chemistry: Secondary | ICD-10-CM | POA: Diagnosis not present

## 2021-07-04 DIAGNOSIS — S199XXA Unspecified injury of neck, initial encounter: Secondary | ICD-10-CM | POA: Diagnosis not present

## 2021-07-04 DIAGNOSIS — R0789 Other chest pain: Secondary | ICD-10-CM | POA: Diagnosis not present

## 2021-07-04 DIAGNOSIS — I472 Ventricular tachycardia, unspecified: Secondary | ICD-10-CM | POA: Diagnosis not present

## 2021-07-04 DIAGNOSIS — G9389 Other specified disorders of brain: Secondary | ICD-10-CM | POA: Diagnosis not present

## 2021-07-04 DIAGNOSIS — I517 Cardiomegaly: Secondary | ICD-10-CM | POA: Diagnosis not present

## 2021-07-04 MED ORDER — SODIUM CHLORIDE 0.9 % IV BOLUS
500.0000 mL | Freq: Once | INTRAVENOUS | Status: AC
  Administered 2021-07-05: 01:00:00 500 mL via INTRAVENOUS

## 2021-07-04 MED ORDER — FENTANYL CITRATE PF 50 MCG/ML IJ SOSY
50.0000 ug | PREFILLED_SYRINGE | Freq: Once | INTRAMUSCULAR | Status: AC
  Administered 2021-07-05: 01:00:00 50 ug via INTRAVENOUS
  Filled 2021-07-04: qty 1

## 2021-07-04 NOTE — ED Triage Notes (Signed)
Patient BIB EMS from home with complaint of 2 falls that occurred today. Pt reporting back pain, 10/10, worse with movement. 100 mcg fentanyl given en route  20 g LFA EMS vitals: 202/86 HR 122 SPO2 97% 2 L 

## 2021-07-05 ENCOUNTER — Emergency Department (HOSPITAL_COMMUNITY): Payer: Medicare Other

## 2021-07-05 ENCOUNTER — Encounter (HOSPITAL_COMMUNITY): Payer: Self-pay

## 2021-07-05 ENCOUNTER — Inpatient Hospital Stay (HOSPITAL_COMMUNITY): Payer: Medicare Other

## 2021-07-05 DIAGNOSIS — E871 Hypo-osmolality and hyponatremia: Secondary | ICD-10-CM | POA: Diagnosis present

## 2021-07-05 DIAGNOSIS — I248 Other forms of acute ischemic heart disease: Secondary | ICD-10-CM | POA: Diagnosis present

## 2021-07-05 DIAGNOSIS — M549 Dorsalgia, unspecified: Secondary | ICD-10-CM | POA: Diagnosis present

## 2021-07-05 DIAGNOSIS — I252 Old myocardial infarction: Secondary | ICD-10-CM | POA: Diagnosis not present

## 2021-07-05 DIAGNOSIS — W19XXXA Unspecified fall, initial encounter: Secondary | ICD-10-CM | POA: Diagnosis not present

## 2021-07-05 DIAGNOSIS — E7800 Pure hypercholesterolemia, unspecified: Secondary | ICD-10-CM | POA: Diagnosis present

## 2021-07-05 DIAGNOSIS — Z79899 Other long term (current) drug therapy: Secondary | ICD-10-CM | POA: Diagnosis not present

## 2021-07-05 DIAGNOSIS — I472 Ventricular tachycardia, unspecified: Secondary | ICD-10-CM | POA: Diagnosis not present

## 2021-07-05 DIAGNOSIS — E039 Hypothyroidism, unspecified: Secondary | ICD-10-CM | POA: Diagnosis present

## 2021-07-05 DIAGNOSIS — R54 Age-related physical debility: Secondary | ICD-10-CM | POA: Diagnosis present

## 2021-07-05 DIAGNOSIS — M79606 Pain in leg, unspecified: Secondary | ICD-10-CM | POA: Diagnosis present

## 2021-07-05 DIAGNOSIS — S22010A Wedge compression fracture of first thoracic vertebra, initial encounter for closed fracture: Secondary | ICD-10-CM | POA: Diagnosis not present

## 2021-07-05 DIAGNOSIS — I214 Non-ST elevation (NSTEMI) myocardial infarction: Secondary | ICD-10-CM

## 2021-07-05 DIAGNOSIS — I2510 Atherosclerotic heart disease of native coronary artery without angina pectoris: Secondary | ICD-10-CM | POA: Diagnosis present

## 2021-07-05 DIAGNOSIS — N6019 Diffuse cystic mastopathy of unspecified breast: Secondary | ICD-10-CM | POA: Diagnosis present

## 2021-07-05 DIAGNOSIS — E8770 Fluid overload, unspecified: Secondary | ICD-10-CM | POA: Diagnosis present

## 2021-07-05 DIAGNOSIS — I10 Essential (primary) hypertension: Secondary | ICD-10-CM | POA: Diagnosis present

## 2021-07-05 DIAGNOSIS — R296 Repeated falls: Secondary | ICD-10-CM | POA: Diagnosis present

## 2021-07-05 DIAGNOSIS — R072 Precordial pain: Secondary | ICD-10-CM | POA: Diagnosis present

## 2021-07-05 DIAGNOSIS — D539 Nutritional anemia, unspecified: Secondary | ICD-10-CM | POA: Diagnosis present

## 2021-07-05 DIAGNOSIS — M8008XA Age-related osteoporosis with current pathological fracture, vertebra(e), initial encounter for fracture: Secondary | ICD-10-CM | POA: Diagnosis present

## 2021-07-05 DIAGNOSIS — R531 Weakness: Secondary | ICD-10-CM | POA: Diagnosis not present

## 2021-07-05 DIAGNOSIS — R748 Abnormal levels of other serum enzymes: Secondary | ICD-10-CM | POA: Diagnosis not present

## 2021-07-05 DIAGNOSIS — M546 Pain in thoracic spine: Secondary | ICD-10-CM | POA: Diagnosis not present

## 2021-07-05 DIAGNOSIS — Z87891 Personal history of nicotine dependence: Secondary | ICD-10-CM | POA: Diagnosis not present

## 2021-07-05 DIAGNOSIS — S22000A Wedge compression fracture of unspecified thoracic vertebra, initial encounter for closed fracture: Secondary | ICD-10-CM | POA: Diagnosis present

## 2021-07-05 DIAGNOSIS — H9190 Unspecified hearing loss, unspecified ear: Secondary | ICD-10-CM | POA: Diagnosis present

## 2021-07-05 DIAGNOSIS — R778 Other specified abnormalities of plasma proteins: Secondary | ICD-10-CM | POA: Diagnosis not present

## 2021-07-05 DIAGNOSIS — G8929 Other chronic pain: Secondary | ICD-10-CM | POA: Diagnosis present

## 2021-07-05 DIAGNOSIS — Z955 Presence of coronary angioplasty implant and graft: Secondary | ICD-10-CM | POA: Diagnosis not present

## 2021-07-05 DIAGNOSIS — Z681 Body mass index (BMI) 19 or less, adult: Secondary | ICD-10-CM | POA: Diagnosis not present

## 2021-07-05 DIAGNOSIS — Z20822 Contact with and (suspected) exposure to covid-19: Secondary | ICD-10-CM | POA: Diagnosis present

## 2021-07-05 DIAGNOSIS — R64 Cachexia: Secondary | ICD-10-CM | POA: Diagnosis present

## 2021-07-05 DIAGNOSIS — S22050A Wedge compression fracture of T5-T6 vertebra, initial encounter for closed fracture: Secondary | ICD-10-CM | POA: Diagnosis not present

## 2021-07-05 LAB — TROPONIN I (HIGH SENSITIVITY)
Troponin I (High Sensitivity): 739 ng/L (ref <18)
Troponin I (High Sensitivity): 1626 ng/L (ref <18)
Troponin I (High Sensitivity): 2675 ng/L (ref <18)
Troponin I (High Sensitivity): 2144 ng/L (ref <18)
Troponin I (High Sensitivity): 2434 ng/L (ref <18)
Troponin I (High Sensitivity): 2444 ng/L (ref <18)

## 2021-07-05 LAB — ECHOCARDIOGRAM COMPLETE
AR max vel: 1.61 cm2
AV Peak grad: 5.5 mmHg
Ao pk vel: 1.17 m/s
Area-P 1/2: 4.15 cm2
Calc EF: 52.8 %
Height: 61 in
S' Lateral: 2.5 cm
Single Plane A2C EF: 52.7 %
Single Plane A4C EF: 52.9 %
Weight: 1622.59 oz

## 2021-07-05 LAB — CBC WITH DIFFERENTIAL/PLATELET
Abs Immature Granulocytes: 0.09 10*3/uL — ABNORMAL HIGH (ref 0.00–0.07)
Basophils Absolute: 0 10*3/uL (ref 0.0–0.1)
Basophils Relative: 0 %
Eosinophils Absolute: 0 10*3/uL (ref 0.0–0.5)
Eosinophils Relative: 0 %
HCT: 29.4 % — ABNORMAL LOW (ref 36.0–46.0)
Hemoglobin: 9.8 g/dL — ABNORMAL LOW (ref 12.0–15.0)
Immature Granulocytes: 1 %
Lymphocytes Relative: 6 %
Lymphs Abs: 0.5 10*3/uL — ABNORMAL LOW (ref 0.7–4.0)
MCH: 34.6 pg — ABNORMAL HIGH (ref 26.0–34.0)
MCHC: 33.3 g/dL (ref 30.0–36.0)
MCV: 103.9 fL — ABNORMAL HIGH (ref 80.0–100.0)
Monocytes Absolute: 0.4 10*3/uL (ref 0.1–1.0)
Monocytes Relative: 4 %
Neutro Abs: 7.1 10*3/uL (ref 1.7–7.7)
Neutrophils Relative %: 89 %
Platelets: 362 10*3/uL (ref 150–400)
RBC: 2.83 MIL/uL — ABNORMAL LOW (ref 3.87–5.11)
RDW: 13.3 % (ref 11.5–15.5)
WBC: 8.1 10*3/uL (ref 4.0–10.5)
nRBC: 0 % (ref 0.0–0.2)

## 2021-07-05 LAB — COMPREHENSIVE METABOLIC PANEL
ALT: 52 U/L — ABNORMAL HIGH (ref 0–44)
AST: 67 U/L — ABNORMAL HIGH (ref 15–41)
Albumin: 3.2 g/dL — ABNORMAL LOW (ref 3.5–5.0)
Alkaline Phosphatase: 606 U/L — ABNORMAL HIGH (ref 38–126)
Anion gap: 8 (ref 5–15)
BUN: 12 mg/dL (ref 8–23)
CO2: 23 mmol/L (ref 22–32)
Calcium: 8.8 mg/dL — ABNORMAL LOW (ref 8.9–10.3)
Chloride: 100 mmol/L (ref 98–111)
Creatinine, Ser: 0.31 mg/dL — ABNORMAL LOW (ref 0.44–1.00)
GFR, Estimated: >60 mL/min (ref >60)
Glucose, Bld: 117 mg/dL — ABNORMAL HIGH (ref 70–99)
Potassium: 4.4 mmol/L (ref 3.5–5.1)
Sodium: 131 mmol/L — ABNORMAL LOW (ref 135–145)
Total Bilirubin: 0.9 mg/dL (ref 0.3–1.2)
Total Protein: 6.9 g/dL (ref 6.5–8.1)

## 2021-07-05 LAB — URINALYSIS, ROUTINE W REFLEX MICROSCOPIC
Bacteria, UA: NONE SEEN
Bilirubin Urine: NEGATIVE
Glucose, UA: NEGATIVE mg/dL
Hgb urine dipstick: NEGATIVE
Ketones, ur: NEGATIVE mg/dL
Leukocytes,Ua: NEGATIVE
Nitrite: NEGATIVE
Protein, ur: 30 mg/dL — AB
Specific Gravity, Urine: >1.046 — ABNORMAL HIGH (ref 1.005–1.030)
pH: 7 (ref 5.0–8.0)

## 2021-07-05 LAB — PROTIME-INR
INR: 1.1 (ref 0.8–1.2)
Prothrombin Time: 14 seconds (ref 11.4–15.2)

## 2021-07-05 LAB — VITAMIN B12: Vitamin B-12: 474 pg/mL (ref 180–914)

## 2021-07-05 LAB — RESP PANEL BY RT-PCR (FLU A&B, COVID) ARPGX2
Influenza A by PCR: NEGATIVE
Influenza B by PCR: NEGATIVE
SARS Coronavirus 2 by RT PCR: NEGATIVE

## 2021-07-05 LAB — MRSA NEXT GEN BY PCR, NASAL: MRSA by PCR Next Gen: NOT DETECTED

## 2021-07-05 LAB — HEPARIN LEVEL (UNFRACTIONATED)
Heparin Unfractionated: <0.1 IU/mL — ABNORMAL LOW (ref 0.30–0.70)
Heparin Unfractionated: 0.32 IU/mL (ref 0.30–0.70)

## 2021-07-05 LAB — GAMMA GT: GGT: 708 U/L — ABNORMAL HIGH (ref 7–50)

## 2021-07-05 LAB — APTT: aPTT: 23 seconds — ABNORMAL LOW (ref 24–36)

## 2021-07-05 LAB — FOLATE: Folate: 34.8 ng/mL (ref >5.9)

## 2021-07-05 LAB — CK: Total CK: 283 U/L — ABNORMAL HIGH (ref 38–234)

## 2021-07-05 MED ORDER — HEPARIN (PORCINE) 25000 UT/250ML-% IV SOLN
750.0000 [IU]/h | INTRAVENOUS | Status: DC
  Administered 2021-07-05: 04:00:00 550 [IU]/h via INTRAVENOUS
  Filled 2021-07-05 (×2): qty 250

## 2021-07-05 MED ORDER — SODIUM CHLORIDE (PF) 0.9 % IJ SOLN
INTRAMUSCULAR | Status: AC
  Filled 2021-07-05: qty 50

## 2021-07-05 MED ORDER — ATORVASTATIN CALCIUM 80 MG PO TABS
80.0000 mg | ORAL_TABLET | Freq: Every day | ORAL | Status: DC
  Administered 2021-07-05 – 2021-07-24 (×20): 80 mg via ORAL
  Filled 2021-07-05 (×15): qty 1
  Filled 2021-07-05: qty 2
  Filled 2021-07-05 (×4): qty 1

## 2021-07-05 MED ORDER — ASPIRIN 81 MG PO CHEW
324.0000 mg | CHEWABLE_TABLET | Freq: Once | ORAL | Status: AC
  Administered 2021-07-05: 10:00:00 324 mg via ORAL
  Filled 2021-07-05: qty 4

## 2021-07-05 MED ORDER — NALOXONE HCL 0.4 MG/ML IJ SOLN: 0.4000 mg | INTRAMUSCULAR | Status: DC | PRN

## 2021-07-05 MED ORDER — ONDANSETRON HCL 4 MG/2ML IJ SOLN: 4.0000 mg | Freq: Four times a day (QID) | INTRAMUSCULAR | Status: DC | PRN

## 2021-07-05 MED ORDER — IOHEXOL 350 MG/ML SOLN
70.0000 mL | Freq: Once | INTRAVENOUS | Status: AC | PRN
  Administered 2021-07-05: 06:00:00 70 mL via INTRAVENOUS

## 2021-07-05 MED ORDER — METOPROLOL TARTRATE 12.5 MG HALF TABLET
12.5000 mg | ORAL_TABLET | Freq: Two times a day (BID) | ORAL | Status: DC
  Administered 2021-07-05 – 2021-07-24 (×40): 12.5 mg via ORAL
  Filled 2021-07-05 (×40): qty 1

## 2021-07-05 MED ORDER — ASPIRIN EC 81 MG PO TBEC
81.0000 mg | DELAYED_RELEASE_TABLET | Freq: Every day | ORAL | Status: DC
  Administered 2021-07-06 – 2021-07-24 (×19): 81 mg via ORAL
  Filled 2021-07-05 (×19): qty 1

## 2021-07-05 MED ORDER — HEPARIN BOLUS VIA INFUSION
2500.0000 [IU] | Freq: Once | INTRAVENOUS | Status: AC
  Administered 2021-07-05: 04:00:00 2500 [IU] via INTRAVENOUS
  Filled 2021-07-05: qty 2500

## 2021-07-05 MED ORDER — LOSARTAN POTASSIUM 50 MG PO TABS
50.0000 mg | ORAL_TABLET | Freq: Every day | ORAL | Status: DC
  Administered 2021-07-05 – 2021-07-24 (×20): 50 mg via ORAL
  Filled 2021-07-05 (×7): qty 1
  Filled 2021-07-05: qty 2
  Filled 2021-07-05 (×12): qty 1

## 2021-07-05 MED ORDER — ACETAMINOPHEN 650 MG RE SUPP: 650.0000 mg | Freq: Four times a day (QID) | RECTAL | Status: DC | PRN

## 2021-07-05 MED ORDER — HEPARIN BOLUS VIA INFUSION
1000.0000 [IU] | Freq: Once | INTRAVENOUS | Status: AC
  Administered 2021-07-05: 13:00:00 1000 [IU] via INTRAVENOUS
  Filled 2021-07-05: qty 1000

## 2021-07-05 MED ORDER — ASPIRIN EC 81 MG PO TBEC: 81.0000 mg | DELAYED_RELEASE_TABLET | Freq: Every day | ORAL | Status: DC

## 2021-07-05 MED ORDER — METOPROLOL TARTRATE 5 MG/5ML IV SOLN
5.0000 mg | Freq: Four times a day (QID) | INTRAVENOUS | Status: DC | PRN
  Administered 2021-07-06 – 2021-07-08 (×4): 5 mg via INTRAVENOUS
  Filled 2021-07-05 (×5): qty 5

## 2021-07-05 MED ORDER — FENTANYL CITRATE PF 50 MCG/ML IJ SOSY
50.0000 ug | PREFILLED_SYRINGE | INTRAMUSCULAR | Status: DC | PRN
  Administered 2021-07-05 (×3): 50 ug via INTRAVENOUS
  Filled 2021-07-05 (×3): qty 1

## 2021-07-05 MED ORDER — ACETAMINOPHEN 325 MG PO TABS
650.0000 mg | ORAL_TABLET | Freq: Four times a day (QID) | ORAL | Status: DC | PRN
  Administered 2021-07-06 – 2021-07-20 (×27): 650 mg via ORAL
  Filled 2021-07-05 (×27): qty 2

## 2021-07-05 NOTE — ED Notes (Signed)
RN Samuella Cota chatted for report and handoff, no purple man/ handoff man to update.

## 2021-07-05 NOTE — Progress Notes (Signed)
Carryover admit to [facility] Day Admitter; will need full admission/H&P upon arrival at [facility].   I discussed this patient's case with the EDP, Dr.Long.  Per these discussions, this is a:  86 year old female who is being admitted for stable appearing acute thoracic compression fractures as well as elevated troponin, after presenting to [facility] ED complaining of acute onset thoracic back pain after fall from the toilet earlier today in the absence of loss of consciousness.  Imaging suggestive of a stable appearing acute thoracic compression fractures. However, while the patient denies "chest pain" , she reports pain over the sternum, worsening with palpation over this area, but without any shortness of breath palpitations, nausea/vomiting.  Initial troponin 700, with repeat trending up to 1600, in the context of reported history of CAD.  EKG without reported acute ischemic changes.  EDP discussed case with cardiology fellow, Dr. Okey Dupre, Who recommended CTA chest, initiation of heparin drip, and admission over to [facility], where cardiology will consult.   CTA chest ordered, with result currently pending.  Heparin drip initiated.  Order for admission over to [facility] has been placed.  Have also placed preliminary admission orders via the adult multi morbid admission order set.    Newton Pigg, DO Hospitalist

## 2021-07-05 NOTE — H&P (Signed)
History and Physical    Natasha Harper:[id number] DOB: [birth year] DOA: 07/04/2021  PCP: Burnis Medin, MD  Patient coming from: Home  Chief Complaint: fall  HPI: Natasha Harper is a 86 y.o. female with medical history significant of hypothyroidism, HLD, CAD, HTN, ACD. Presenting with back pain and falls. History is from her husband. Yesterday morning, she was getting up from a chair and tried to balance herself on her walker. However, she couldn't balance herself and her legs gave out. Her husband tried to catch her but couldn't support her. So she fell to the ground. She didn't hit her head. There was no LOC. The husband and son-in-law helped her up. Later she was in the bathroom on the toilet. She had difficulty getting up. She was able to lift up temporarily, but then slipped back down to the toilet seat very hard. Her husband tried to help her up, but was not able to support her. So she gently fell to the floor. After the second fall, she reports that she had some substernal chest pain that was painful w/ touch. Otherwise, she had no symptoms. Family became concerned and had her brought to the ED for evaluation.   ED Course: CT was notable for T1, T6 compression fractures. Trp was elevated and continued to rise. Cardiology was consulted. Pt was placed on heparin gtt. [facility] was called for admission.   Review of Systems:  Denies palpitations, dyspnea, abdominal pain, lightheadedness, dizziness, syncopal episodes, N/V/D. Review of systems is otherwise negative for all not mentioned in HPI.   PMHx Past Medical History:  Diagnosis Date   CAD (coronary artery disease)    PCI to RCA and diagonal in remote past, residual 70% LAD  /   nuclear, 2007, no ischemia   Carotid arterial disease ([place])    Doppler, June, 2011, stable, 67-20% R. ICA, 94-70% LICA   Contact lens/glasses fitting    wears contacts or glasses   Dyslipidemia    Significant drop in LDL from Lipitor even though LDL remains  high   Ejection fraction    EF 65%, nuclear, 2007   Fibrocystic breast    HTN (hypertension)     no med   Hypercholesterolemia    Hypothyroidism    Thyroid surgery in the past, thyroid nodules followed by Dr.Ellison   Lump or mass in breast 07/19/2007   Excised 01/12/13. B9 on pathQualifier: Diagnosis of  By: Regis Bill MD, Standley Brooking     Osteoporosis    Prominent abdominal aortic pulsation    No abdominal aneurysm by ultrasound   Rectal fissure     PSHx Past Surgical History:  Procedure Laterality Date   BREAST BIOPSY Right 01/12/2013   Procedure: Removal of right breast mass;  Surgeon: Haywood Lasso, MD;  Location: [place];  Service: General;  Laterality: Right;   BREAST EXCISIONAL BIOPSY  9/14   scar tissue from the needle biopsy   [place]      SocHx  reports that she quit smoking about 63 years ago. She has never used smokeless tobacco. She reports that she does not drink alcohol and does not use drugs.  Allergies  Allergen Reactions   Actonel [Risedronate Sodium]     bloating   Evista [Raloxifene]     Abdominal cramps   Fosamax [Alendronate Sodium]     Abdominal cramps    FamHx Family History  Problem Relation Age of Onset   Heart attack Father    Osteoporosis Mother    Breast cancer Daughter     Prior to Admission medications   Medication Sig Start Date End Date Taking? Authorizing Provider  acetaminophen (TYLENOL) 500 MG tablet Take 1,000 mg by mouth every 6 (six) hours as needed for moderate pain.    [provider]  amLODipine (NORVASC) 5 MG tablet Take 1 tablet (5 mg total) by mouth daily. Patient taking differently: Take 5 mg by mouth daily as needed (high blood pressure). 02/10/21   Oswald Hillock, MD  Ascorbic Acid (VITAMIN C) 100 MG tablet Take 100 mg by mouth daily.    [provider]  aspirin 81 MG tablet Take 81 mg by mouth daily.     [provider]  atorvastatin (LIPITOR) 80 MG tablet TAKE 1 TABLET BY MOUTH DAILY 10/30/20   Panosh, Standley Brooking, MD  baclofen (LIORESAL) 10 MG tablet Take 0.5-1 tablets (5-10 mg total) by mouth 3 (three) times daily as needed for muscle spasms. Patient not taking: No sig reported 01/28/21   Hilts, Michael, MD  cholecalciferol (VITAMIN D3) 25 MCG (1000 UNIT) tablet Take 1,000 Units by mouth daily.    [provider]  fish oil-omega-3 fatty acids 1000 MG capsule Take 1,000 mg by mouth daily.    [provider]  folic acid (FOLVITE) 1 MG tablet Take 1 tablet (1 mg total) by mouth daily. 04/16/21   Aline August, MD  lidocaine (LIDODERM) 5 % Place 1 patch onto the skin daily. Remove & Discard patch within 12 hours or as directed by MD Patient taking differently: Place 1 patch onto the skin daily as needed (pain). Remove & Discard patch within 12 hours or as directed by MD 03/05/21   Panosh, Standley Brooking, MD  Menaquinone-7 (VITAMIN K2 PO) Take 1 tablet by mouth daily.    [provider]  MULTIPLE VITAMIN PO Take 1 tablet by mouth daily.    [provider]  SYNTHROID 50 MCG tablet TAKE 1 TABLET BY MOUTH DAILY 10/30/20   Panosh, Standley Brooking, MD  traMADol (ULTRAM) 50 MG tablet Take 1 tablet (50 mg total) by mouth every 6 (six) hours as needed for moderate pain. 04/15/21   Aline August, MD    Physical Exam: Vitals:   07/05/21 0400 07/05/21 0430 07/05/21 0500 07/05/21 0700  BP: (!) 148/72 (!) 162/75 (!) 173/77 (!) 175/79  Pulse: 82 84 87 86  Resp: 17 (!) 23 (!) 24 19  Temp:      TempSrc:      SpO2: 94% 94% 99% 98%    General: 86 y.o. female resting in bed in NAD Eyes: PERRL, normal sclera ENMT: Nares patent w/o discharge, orophaynx clear, dentition normal, ears w/o discharge/lesions/ulcers Neck: Supple, trachea midline Cardiovascular: RRR, +S1, S2, no m/g/r, equal pulses throughout; reproducible sternal chest apin Respiratory: CTABL, no w/r/r, decreased at bases  secondary to shallow breathing that is secondary to back and chest pain w/ deep breaths GI: BS+, NDNT, no masses noted, no organomegaly noted MSK: No e/c/c Neuro: A&O x 3, no focal deficits Psyc: Appropriate interaction and affect, calm/cooperative  Labs on Admission: I have personally reviewed following labs and imaging studies  CBC: Recent Labs  Lab 07/05/21 0102  WBC 8.1  NEUTROABS 7.1  HGB 9.8*  HCT 29.4*  MCV 103.9*  PLT 902   Basic Metabolic Panel: Recent Labs  Lab 07/05/21 0244  NA 131*  K 4.4  CL 100  CO2 23  GLUCOSE 117*  BUN 12  CREATININE 0.31*  CALCIUM 8.8*   GFR: CrCl cannot be calculated (Unknown ideal weight.). Liver Function Tests: Recent Labs  Lab 07/05/21 0244  AST 67*  ALT 52*  ALKPHOS 606*  BILITOT 0.9  PROT 6.9  ALBUMIN 3.2*   No results for input(s): LIPASE, AMYLASE in the last 168 hours. No results for input(s): AMMONIA in the last 168 hours. Coagulation Profile: Recent Labs  Lab 07/05/21 0315  INR 1.1   Cardiac Enzymes: No results for input(s): CKTOTAL, CKMB, CKMBINDEX, TROPONINI in the last 168 hours. BNP (last 3 results) No results for input(s): PROBNP in the last 8760 hours. HbA1C: No results for input(s): HGBA1C in the last 72 hours. CBG: No results for input(s): GLUCAP in the last 168 hours. Lipid Profile: No results for input(s): CHOL, HDL, LDLCALC, TRIG, CHOLHDL, LDLDIRECT in the last 72 hours. Thyroid Function Tests: No results for input(s): TSH, T4TOTAL, FREET4, T3FREE, THYROIDAB in the last 72 hours. Anemia Panel: No results for input(s): VITAMINB12, FOLATE, FERRITIN, TIBC, IRON, RETICCTPCT in the last 72 hours. Urine analysis:    Component Value Date/Time   COLORURINE yellow 07/19/2007 0851   APPEARANCEUR Clear 07/19/2007 0851   LABSPEC >=1.030 07/19/2007 0851   PHURINE 5.0 07/19/2007 0851   HGBUR negative 07/19/2007 0851   BILIRUBINUR n 03/18/2016 1328   PROTEINUR n 03/18/2016 1328   UROBILINOGEN negative  03/18/2016 1328   UROBILINOGEN 0.2 07/19/2007 0851   NITRITE n 03/18/2016 1328   NITRITE negative 07/19/2007 0851   LEUKOCYTESUR Negative 03/18/2016 1328    Radiological Exams on Admission: DG Chest 2 View  Result Date: 07/05/2021 CLINICAL DATA:  Chest pain.  Fall. EXAM: CHEST - 2 VIEW COMPARISON:  None. FINDINGS: Left lung base density, likely atelectasis or infiltrate. Trace left pleural effusion may be present. No pneumothorax. There is cardiomegaly with vascular congestion. Atherosclerotic calcification of the aorta. Osteopenia with degenerative changes of the spine. Multilevel age indeterminate compression fractures. IMPRESSION: 1. Left lung base atelectasis versus infiltrate. 2. Cardiomegaly with vascular congestion. 3. Multilevel age indeterminate compression fractures. Electronically Signed   By: Anner Crete M.D.   On: 07/05/2021 00:40   DG Thoracic Spine 2 View  Result Date: 07/05/2021 CLINICAL DATA:  Low back pain after 2 falls today. Limited movement. EXAM: THORACIC SPINE 2 VIEWS COMPARISON:  CT thoracic spine 04/13/2021 FINDINGS: Normal alignment of the thoracic spine. Diffuse bone demineralization. Multiple vertebral compression fractures demonstrated at T12, T11, T10, T9, T8, T7, and T6 levels. Mild endplate compression at T5 and T4. Similar appearance to previous study. No definite new fractures. No abnormal paraspinal soft tissue swelling. Due to bone demineralization, CT would be more sensitive for evaluation of possible progression if clinically indicated. IMPRESSION: Diffuse bone demineralization with multiple thoracic vertebral compression fractures, similar to prior study. Changes likely represent osteoporosis. Electronically Signed   By: Lucienne Capers M.D.   On: 07/05/2021 00:00   DG Lumbar Spine Complete  Result Date: 07/04/2021 CLINICAL DATA:  Back pain.  Two falls today. EXAM: LUMBAR SPINE - COMPLETE 4+ VIEW COMPARISON:  CT 04/13/2021 FINDINGS: Diffuse bone  demineralization. Five lumbar type vertebral bodies. Compression fractures again demonstrated at L1 without change. Mild superior endplate compression demonstrated at L2, L4, and L5, similar to prior study. Alignment is unchanged. Overlying bowel gas limits visualization of the sacrum. Prominent vascular calcifications. IMPRESSION: Diffuse bone demineralization with multiple compression fractures, similar to prior study. Changes likely due to osteoporosis.  No definite acute fractures. Electronically Signed   By: Lucienne Capers M.D.   On: 07/04/2021 23:58   CT Head Wo Contrast  Result Date: 07/05/2021 CLINICAL DATA:  Multiple falls. EXAM: CT HEAD WITHOUT CONTRAST TECHNIQUE: Contiguous axial images were obtained from the base of the skull through the vertex without intravenous contrast. COMPARISON:  None. FINDINGS: Brain: There is moderate severity cerebral atrophy with widening of the extra-axial spaces and ventricular dilatation. There are areas of decreased attenuation within the white matter tracts of the supratentorial brain, consistent with microvascular disease changes. Vascular: No hyperdense vessel or unexpected calcification. Skull: Negative for acute fracture. A 9 mm x 4 mm benign-appearing area of focal cortical thickening is seen within left frontal region. Sinuses/Orbits: No acute finding. Other: None. IMPRESSION: 1. Generalized cerebral atrophy and microvascular disease changes of the supratentorial brain. 2. No acute intracranial abnormality. Electronically Signed   By: Virgina Norfolk M.D.   On: 07/05/2021 02:30   CT Angio Chest PE W and/or Wo Contrast  Result Date: 07/05/2021 CLINICAL DATA:  86 year old female with clinical high suspicion of pulmonary embolism. EXAM: CT ANGIOGRAPHY CHEST WITH CONTRAST TECHNIQUE: Multidetector CT imaging of the chest was performed using the standard protocol during bolus administration of intravenous contrast. Multiplanar CT image reconstructions and  MIPs were obtained to evaluate the vascular anatomy. CONTRAST:  13m OMNIPAQUE IOHEXOL 350 MG/ML SOLN COMPARISON:  No priors. FINDINGS: Cardiovascular: No filling defects are noted in the pulmonary arterial tree to suggest pulmonary embolism. Heart size is normal. There is no significant pericardial fluid, thickening or pericardial calcification. There is aortic atherosclerosis, as well as atherosclerosis of the great vessels of the mediastinum and the coronary arteries, including calcified atherosclerotic plaque in the left main, left anterior descending and right coronary arteries. Mediastinum/Nodes: No pathologically enlarged mediastinal or hilar lymph nodes. Hilar esophagus is unremarkable in appearance. No axillary lymphadenopathy. Lungs/Pleura: No acute consolidative airspace disease. No pleural effusions. Dependent areas of subsegmental atelectasis lying in the lower lobes of the lungs bilaterally. No definite suspicious appearing pulmonary nodules or masses are noted. Upper Abdomen: Aortic atherosclerosis. Musculoskeletal: In the right breast (axial image 88 of series 4) there is a 2.0 x 1.9 cm high attenuation (83 HU) lesion. Multiple chronic appearing compression fractures are noted in the thoracic spine involving the superior endplate of T1, T5, superior endplate of T6, T7, T8, T9, T10 and T11, most severe at T11 where there is 70% loss of anterior vertebral body height. There are no aggressive appearing lytic or blastic lesions noted in the visualized portions of the skeleton. Review of the MIP images confirms the above findings. IMPRESSION: 1. No evidence of pulmonary embolism. 2. Widespread dependent atelectasis in the lower lobes of the lungs bilaterally. 3. Aortic atherosclerosis, in addition to left main and 2 vessel coronary artery disease. 4. 2.0 x 1.9 cm high attenuation lesion in the right breast. This is nonspecific, but warrants further evaluation with follow-up mammography and/or breast  ultrasound in the near future to exclude neoplasm. Aortic Atherosclerosis (ICD10-I70.0). Electronically Signed   By: DVinnie LangtonM.D.   On: 07/05/2021 07:01   CT Cervical Spine Wo Contrast  Result Date: 07/05/2021 CLINICAL DATA:  Trauma EXAM: CT CERVICAL, THORACIC, AND LUMBAR SPINE WITHOUT CONTRAST TECHNIQUE: Multidetector CT imaging of the cervical, thoracic and lumbar spine was performed without intravenous contrast. Multiplanar CT image reconstructions were also generated. COMPARISON:  04/13/2021 FINDINGS: CT CERVICAL SPINE FINDINGS Alignment: Normal. Skull base and vertebrae: No acute fracture. No primary  bone lesion or focal pathologic process. Soft tissues and spinal canal: No prevertebral fluid or swelling. No visible canal hematoma. Disc levels:  No spinal canal stenosis. CT THORACIC SPINE FINDINGS Alignment: Normal. Vertebrae: There is a wedge compression fracture of T1 with less than 20% loss, involving the anterior wall and superior endplate. No retropulsion. There is also a fracture at T6 with approximately 25% height loss that is new since the prior study. The fracture involves the anterior and posterior walls and the superior endplate. Lower thoracic compression deformities are unchanged. Paraspinal and other soft tissues: Calcific aortic atherosclerosis Disc levels: No spinal canal stenosis CT LUMBAR SPINE FINDINGS Segmentation: 5 lumbar type vertebrae. Alignment: Grade 1 anterolisthesis at L4-5 Vertebrae: Chronic compression deformity of L1. Paraspinal and other soft tissues: Calcific aortic atherosclerosis. Disc levels: No spinal canal stenosis. IMPRESSION: 1. T1 and T6 compression fractures with 10-20% height loss, new since the prior study. No retropulsion or spinal canal stenosis. 2. Multiple other chronic compression deformities of the thoracic and lumbar spine. 3. No acute fracture or static subluxation of the cervical spine. Aortic Atherosclerosis (ICD10-I70.0). Electronically Signed    By: Ulyses Jarred M.D.   On: 07/05/2021 01:16   CT Thoracic Spine Wo Contrast  Result Date: 07/05/2021 CLINICAL DATA:  Trauma EXAM: CT CERVICAL, THORACIC, AND LUMBAR SPINE WITHOUT CONTRAST TECHNIQUE: Multidetector CT imaging of the cervical, thoracic and lumbar spine was performed without intravenous contrast. Multiplanar CT image reconstructions were also generated. COMPARISON:  04/13/2021 FINDINGS: CT CERVICAL SPINE FINDINGS Alignment: Normal. Skull base and vertebrae: No acute fracture. No primary bone lesion or focal pathologic process. Soft tissues and spinal canal: No prevertebral fluid or swelling. No visible canal hematoma. Disc levels:  No spinal canal stenosis. CT THORACIC SPINE FINDINGS Alignment: Normal. Vertebrae: There is a wedge compression fracture of T1 with less than 20% loss, involving the anterior wall and superior endplate. No retropulsion. There is also a fracture at T6 with approximately 25% height loss that is new since the prior study. The fracture involves the anterior and posterior walls and the superior endplate. Lower thoracic compression deformities are unchanged. Paraspinal and other soft tissues: Calcific aortic atherosclerosis Disc levels: No spinal canal stenosis CT LUMBAR SPINE FINDINGS Segmentation: 5 lumbar type vertebrae. Alignment: Grade 1 anterolisthesis at L4-5 Vertebrae: Chronic compression deformity of L1. Paraspinal and other soft tissues: Calcific aortic atherosclerosis. Disc levels: No spinal canal stenosis. IMPRESSION: 1. T1 and T6 compression fractures with 10-20% height loss, new since the prior study. No retropulsion or spinal canal stenosis. 2. Multiple other chronic compression deformities of the thoracic and lumbar spine. 3. No acute fracture or static subluxation of the cervical spine. Aortic Atherosclerosis (ICD10-I70.0). Electronically Signed   By: Ulyses Jarred M.D.   On: 07/05/2021 01:16   CT Lumbar Spine Wo Contrast  Result Date:  07/05/2021 CLINICAL DATA:  Trauma EXAM: CT CERVICAL, THORACIC, AND LUMBAR SPINE WITHOUT CONTRAST TECHNIQUE: Multidetector CT imaging of the cervical, thoracic and lumbar spine was performed without intravenous contrast. Multiplanar CT image reconstructions were also generated. COMPARISON:  04/13/2021 FINDINGS: CT CERVICAL SPINE FINDINGS Alignment: Normal. Skull base and vertebrae: No acute fracture. No primary bone lesion or focal pathologic process. Soft tissues and spinal canal: No prevertebral fluid or swelling. No visible canal hematoma. Disc levels:  No spinal canal stenosis. CT THORACIC SPINE FINDINGS Alignment: Normal. Vertebrae: There is a wedge compression fracture of T1 with less than 20% loss, involving the anterior wall and superior endplate. No retropulsion. There is  also a fracture at T6 with approximately 25% height loss that is new since the prior study. The fracture involves the anterior and posterior walls and the superior endplate. Lower thoracic compression deformities are unchanged. Paraspinal and other soft tissues: Calcific aortic atherosclerosis Disc levels: No spinal canal stenosis CT LUMBAR SPINE FINDINGS Segmentation: 5 lumbar type vertebrae. Alignment: Grade 1 anterolisthesis at L4-5 Vertebrae: Chronic compression deformity of L1. Paraspinal and other soft tissues: Calcific aortic atherosclerosis. Disc levels: No spinal canal stenosis. IMPRESSION: 1. T1 and T6 compression fractures with 10-20% height loss, new since the prior study. No retropulsion or spinal canal stenosis. 2. Multiple other chronic compression deformities of the thoracic and lumbar spine. 3. No acute fracture or static subluxation of the cervical spine. Aortic Atherosclerosis (ICD10-I70.0). Electronically Signed   By: Ulyses Jarred M.D.   On: 07/05/2021 01:16    EKG: Independently reviewed. Sinus tach, no st elevations  Assessment/Plan Falls Acute compression fractures of T1 and T6     - admit to inpt,  progressive @ [facility]     - PRN IV pain meds     - CT w/ w/ noted compression fractures; spoke with IR, they will see her while she is at [facility]     - patient refuses bracing at this time     - PT/OT consult  NSTEMI     - EDP spoke with cardiology; rec'd heparin gtt and transfer to [facility]     - she has reproducible sternal chest pain     - EKG w/o st elevations     - continue to trend trp     - keep her NPO except sips/meds until cards sees her today  HTN     - resume home regimen when confirmed  Hypothyroidism     - resume home regimen when confirmed  Elevated alk phos     - check ggt     - check RUQ [place]     - no abdominal pain; follow  Hyponatremia     - mild, follow AM labs  Macrocytic anemia     - she is at baseline; no evidence of bleed     - check THF and B12  Non-specific right breast abnormality on CTA     - as noted on CTA, right breast needs non-emergent mammography or [place] to exclude neoplasm  DVT prophylaxis: SCDs  Code Status: FULL  Family Communication: w/ husband at bedside  Consults called: IR (Dr. Jarvis Newcomer); EDP spoke with cardiology (Dr. Kalman Shan)  Status is: Inpatient  Remains inpatient appropriate because: severity of illness  Melayah Skorupski [place] DO [facility]  If 7PM-7AM, please contact night-coverage [email or web address]  07/05/2021, 7:38 AM

## 2021-07-05 NOTE — ED Notes (Signed)
[facility] called for transport. Papers at nurses station.  

## 2021-07-05 NOTE — ED Provider Notes (Signed)
Emergency Department Provider Note   I have reviewed the triage vital signs and the nursing notes.   HISTORY  Chief Complaint Back Pain   HPI Natasha Harper is a 86 y.o. female with past medical history reviewed below including CAD, hypertension, high cholesterol presents to the emergency department with mid and lower back pain as well as chest discomfort.  She arrives by EMS who report 2 falls today.  The patient's memory of the events is somewhat limited.  She tells me that the fall today was from the toilet seat when she was standing.  She states that she slid to the floor and could not get up and so called for help.  She lives at home with her husband.  She denies any numbness or weakness in the legs.  She is mainly having midline back pain in the mid to lower back.  No bowel or bladder incontinence.  She also describes some "soreness to my sternum" but tells me that this was occurring before the fall and that it does not feel "deep" in the chest. No SOB. Denies syncope.  When asking about the falls today, the patient continues to correct me that she did not fall but does seem to have some memory difficulty.    Past Medical History:  Diagnosis Date   CAD (coronary artery disease)    PCI to RCA and diagonal in remote past, residual 70% LAD  /   nuclear, 2007, no ischemia   Carotid arterial disease ([place])    Doppler, June, 2011, stable, [id number] R. ICA, [id number] LICA   Contact lens/glasses fitting    wears contacts or glasses   Dyslipidemia    Significant drop in LDL from Lipitor even though LDL remains high   Ejection fraction    EF 65%, nuclear, 2007   Fibrocystic breast    HTN (hypertension)     no med   Hypercholesterolemia    Hypothyroidism    Thyroid surgery in the past, thyroid nodules followed by Dr.Ellison   Lump or mass in breast 07/19/2007   Excised 01/12/13. B9 on pathQualifier: Diagnosis of  By: Regis Bill MD, Standley Brooking     Osteoporosis    Prominent abdominal aortic  pulsation    No abdominal aneurysm by ultrasound   Rectal fissure     Patient Active Problem List   Diagnosis Date Noted   Primary hypertension    Elevated troponin    Demand ischemia (HCC)    Thoracic compression fracture ([place]) 07/05/2021   Vertebral fracture, osteoporotic ([place]) 04/15/2021   Hyponatremia 04/14/2021   Hypokalemia 04/14/2021   Anemia 04/14/2021   Compression fracture of thoracic vertebra, closed, initial encounter ([place]) 04/13/2021   Sacral fracture, closed ([place]) 04/13/2021   Compression fracture of thoracic vertebra ([place]) 02/06/2021   Compression of lumbar vertebra ([place]) 02/06/2021   Back pain 02/05/2021   Medicare annual wellness visit, subsequent 12/20/2014   Decreased hearing 10/05/2013   Dyslipidemia    Carotid arterial disease ([place])    Hypothyroidism    Ejection fraction    Visit for preventive health examination 06/02/2011   Medication side effect 06/02/2011   Disorder of bone and cartilage 04/07/2008   HYPERTENSION, WHITE COAT 04/07/2008   HYPERGLYCEMIA, FASTING 07/21/2007   THYROID NODULE 04/05/2007    Past Surgical History:  Procedure Laterality Date   BREAST BIOPSY Right 01/12/2013   Procedure: Removal of right breast mass;  Surgeon: Haywood Lasso, MD;  Location: [facility];  Service:  General;  Laterality: Right;   BREAST EXCISIONAL BIOPSY  9/14   scar tissue from the needle biopsy   [place]     THYROID CYST EXCISION      Allergies Actonel [risedronate sodium], Evista [raloxifene], and Fosamax [alendronate sodium]  Family History  Problem Relation Age of Onset   Heart attack Father    Osteoporosis Mother    Breast cancer Daughter     Social History Social History   Tobacco Use   Smoking status: Former    Types: Cigarettes    Quit date: 07/07/1958    Years since quitting: 63.0   Smokeless tobacco: Never  Substance Use Topics   Alcohol use: No   Drug use: No     Review of Systems  Constitutional: No fever/chills Eyes: No visual changes. ENT: No sore throat. Cardiovascular: Positive chest pain. Respiratory: Denies shortness of breath. Gastrointestinal: No abdominal pain.  No nausea, no vomiting.  No diarrhea.  No constipation. Genitourinary: Negative for dysuria. Musculoskeletal: Positive for back pain. Skin: Negative for rash. Neurological: Negative for headaches, focal weakness or numbness.  10-point ROS otherwise negative.  ____________________________________________   PHYSICAL EXAM:  VITAL SIGNS: ED Triage Vitals  Enc Vitals Group     BP 07/04/21 2245 129/86     Pulse Rate 07/04/21 2245 (!) 102     Resp 07/04/21 2245 20     Temp 07/04/21 2245 97.7 F (36.5 C)     Temp Source 07/04/21 2245 Oral     SpO2 07/04/21 2245 93 %   Constitutional: Alert and oriented.  Patient laying flat in bed and has pain with any attempted sitting up type motion.  Eyes: Conjunctivae are normal.  Head: Atraumatic. Nose: No congestion/rhinnorhea. Mouth/Throat: Mucous membranes are moist.  Oropharynx non-erythematous. Neck: No stridor. C collar in place.  Cardiovascular: Normal rate, regular rhythm. Good peripheral circulation. Grossly normal heart sounds.   Respiratory: Normal respiratory effort.  No retractions. Lungs CTAB. Gastrointestinal: Soft and nontender. No distention.  Musculoskeletal: Normal active and passive range of motion of the bilateral upper and lower extremities without difficulty. Neurologic:  Normal speech and language. No gross focal neurologic deficits are appreciated.  Skin:  Skin is warm, dry and intact. No rash noted.   ____________________________________________   LABS (all labs ordered are listed, but only abnormal results are displayed)  Labs Reviewed  CBC WITH DIFFERENTIAL/PLATELET - Abnormal; Notable for the following components:      Result Value   RBC 2.83 (*)    Hemoglobin 9.8 (*)    HCT 29.4 (*)     MCV 103.9 (*)    MCH 34.6 (*)    Lymphs Abs 0.5 (*)    Abs Immature Granulocytes 0.09 (*)    All other components within normal limits  URINALYSIS, ROUTINE W REFLEX MICROSCOPIC - Abnormal; Notable for the following components:   APPearance HAZY (*)    Specific Gravity, Urine >1.046 (*)    Protein, ur 30 (*)    All other components within normal limits  COMPREHENSIVE METABOLIC PANEL - Abnormal; Notable for the following components:   Sodium 131 (*)    Glucose, Bld 117 (*)    Creatinine, Ser 0.31 (*)    Calcium 8.8 (*)    Albumin 3.2 (*)    AST 67 (*)    ALT 52 (*)    Alkaline Phosphatase 606 (*)    All other components within normal limits  APTT - Abnormal;  Notable for the following components:   aPTT 23 (*)    All other components within normal limits  HEPARIN LEVEL (UNFRACTIONATED) - Abnormal; Notable for the following components:   Heparin Unfractionated <0.10 (*)    All other components within normal limits  GAMMA GT - Abnormal; Notable for the following components:   GGT 708 (*)    All other components within normal limits  CK - Abnormal; Notable for the following components:   Total CK 283 (*)    All other components within normal limits  CBC - Abnormal; Notable for the following components:   RBC 2.69 (*)    Hemoglobin 9.1 (*)    HCT 27.4 (*)    MCV 101.9 (*)    All other components within normal limits  LIPID PANEL - Abnormal; Notable for the following components:   LDL Cholesterol 116 (*)    All other components within normal limits  CREATININE, SERUM - Abnormal; Notable for the following components:   Creatinine, Ser 1.13 (*)    GFR, Estimated 48 (*)    All other components within normal limits  CBC - Abnormal; Notable for the following components:   RBC 2.70 (*)    Hemoglobin 9.5 (*)    HCT 27.6 (*)    MCV 102.2 (*)    MCH 35.2 (*)    All other components within normal limits  BASIC METABOLIC PANEL - Abnormal; Notable for the following components:    Sodium 132 (*)    Calcium 8.8 (*)    All other components within normal limits  COMPREHENSIVE METABOLIC PANEL - Abnormal; Notable for the following components:   Sodium 131 (*)    Chloride 95 (*)    Glucose, Bld 100 (*)    Creatinine, Ser 0.40 (*)    Calcium 8.8 (*)    Albumin 2.7 (*)    AST 44 (*)    Alkaline Phosphatase 544 (*)    All other components within normal limits  TROPONIN I (HIGH SENSITIVITY) - Abnormal; Notable for the following components:   Troponin I (High Sensitivity) 739 (*)    All other components within normal limits  TROPONIN I (HIGH SENSITIVITY) - Abnormal; Notable for the following components:   Troponin I (High Sensitivity) 1,626 (*)    All other components within normal limits  TROPONIN I (HIGH SENSITIVITY) - Abnormal; Notable for the following components:   Troponin I (High Sensitivity) 2,675 (*)    All other components within normal limits  TROPONIN I (HIGH SENSITIVITY) - Abnormal; Notable for the following components:   Troponin I (High Sensitivity) 2,144 (*)    All other components within normal limits  TROPONIN I (HIGH SENSITIVITY) - Abnormal; Notable for the following components:   Troponin I (High Sensitivity) 2,434 (*)    All other components within normal limits  TROPONIN I (HIGH SENSITIVITY) - Abnormal; Notable for the following components:   Troponin I (High Sensitivity) 2,444 (*)    All other components within normal limits  RESP PANEL BY RT-PCR (FLU A&B, COVID) ARPGX2  MRSA NEXT GEN BY PCR, NASAL  PROTIME-INR  FOLATE  VITAMIN B12  HEPARIN LEVEL (UNFRACTIONATED)  HEPARIN LEVEL (UNFRACTIONATED)  IRON AND TIBC  FERRITIN   ____________________________________________  EKG   EKG Interpretation  Date/Time:  Friday July 05 2021 00:59:18 EST Ventricular Rate:  96 PR Interval:  144 QRS Duration: 94 QT Interval:  370 QTC Calculation: 468 R Axis:   27 Text Interpretation: Sinus rhythm Left atrial enlargement Probable  left  ventricular hypertrophy Borderline T abnormalities, inferior leads Confirmed by Nanda Quinton [phone]) on 07/05/2021 1:02:40 AM        ____________________________________________  RADIOLOGY  DG Chest 2 View  Result Date: 07/05/2021 CLINICAL DATA:  Chest pain.  Fall. EXAM: CHEST - 2 VIEW COMPARISON:  None. FINDINGS: Left lung base density, likely atelectasis or infiltrate. Trace left pleural effusion may be present. No pneumothorax. There is cardiomegaly with vascular congestion. Atherosclerotic calcification of the aorta. Osteopenia with degenerative changes of the spine. Multilevel age indeterminate compression fractures. IMPRESSION: 1. Left lung base atelectasis versus infiltrate. 2. Cardiomegaly with vascular congestion. 3. Multilevel age indeterminate compression fractures. Electronically Signed   By: Anner Crete M.D.   On: 07/05/2021 00:40   DG Thoracic Spine 2 View  Result Date: 07/05/2021 CLINICAL DATA:  Low back pain after 2 falls today. Limited movement. EXAM: THORACIC SPINE 2 VIEWS COMPARISON:  CT thoracic spine 04/13/2021 FINDINGS: Normal alignment of the thoracic spine. Diffuse bone demineralization. Multiple vertebral compression fractures demonstrated at T12, T11, T10, T9, T8, T7, and T6 levels. Mild endplate compression at T5 and T4. Similar appearance to previous study. No definite new fractures. No abnormal paraspinal soft tissue swelling. Due to bone demineralization, CT would be more sensitive for evaluation of possible progression if clinically indicated. IMPRESSION: Diffuse bone demineralization with multiple thoracic vertebral compression fractures, similar to prior study. Changes likely represent osteoporosis. Electronically Signed   By: Lucienne Capers M.D.   On: 07/05/2021 00:00   DG Lumbar Spine Complete  Result Date: 07/04/2021 CLINICAL DATA:  Back pain.  Two falls today. EXAM: LUMBAR SPINE - COMPLETE 4+ VIEW COMPARISON:  CT 04/13/2021 FINDINGS: Diffuse bone  demineralization. Five lumbar type vertebral bodies. Compression fractures again demonstrated at L1 without change. Mild superior endplate compression demonstrated at L2, L4, and L5, similar to prior study. Alignment is unchanged. Overlying bowel gas limits visualization of the sacrum. Prominent vascular calcifications. IMPRESSION: Diffuse bone demineralization with multiple compression fractures, similar to prior study. Changes likely due to osteoporosis. No definite acute fractures. Electronically Signed   By: Lucienne Capers M.D.   On: 07/04/2021 23:58   CT Head Wo Contrast  Result Date: 07/05/2021 CLINICAL DATA:  Multiple falls. EXAM: CT HEAD WITHOUT CONTRAST TECHNIQUE: Contiguous axial images were obtained from the base of the skull through the vertex without intravenous contrast. COMPARISON:  None. FINDINGS: Brain: There is moderate severity cerebral atrophy with widening of the extra-axial spaces and ventricular dilatation. There are areas of decreased attenuation within the white matter tracts of the supratentorial brain, consistent with microvascular disease changes. Vascular: No hyperdense vessel or unexpected calcification. Skull: Negative for acute fracture. A 9 mm x 4 mm benign-appearing area of focal cortical thickening is seen within left frontal region. Sinuses/Orbits: No acute finding. Other: None. IMPRESSION: 1. Generalized cerebral atrophy and microvascular disease changes of the supratentorial brain. 2. No acute intracranial abnormality. Electronically Signed   By: Virgina Norfolk M.D.   On: 07/05/2021 02:30   CT Cervical Spine Wo Contrast  Result Date: 07/05/2021 CLINICAL DATA:  Trauma EXAM: CT CERVICAL, THORACIC, AND LUMBAR SPINE WITHOUT CONTRAST TECHNIQUE: Multidetector CT imaging of the cervical, thoracic and lumbar spine was performed without intravenous contrast. Multiplanar CT image reconstructions were also generated. COMPARISON:  04/13/2021 FINDINGS: CT CERVICAL SPINE  FINDINGS Alignment: Normal. Skull base and vertebrae: No acute fracture. No primary bone lesion or focal pathologic process. Soft tissues and spinal canal: No prevertebral fluid or swelling. No visible canal hematoma.  Disc levels:  No spinal canal stenosis. CT THORACIC SPINE FINDINGS Alignment: Normal. Vertebrae: There is a wedge compression fracture of T1 with less than 20% loss, involving the anterior wall and superior endplate. No retropulsion. There is also a fracture at T6 with approximately 25% height loss that is new since the prior study. The fracture involves the anterior and posterior walls and the superior endplate. Lower thoracic compression deformities are unchanged. Paraspinal and other soft tissues: Calcific aortic atherosclerosis Disc levels: No spinal canal stenosis CT LUMBAR SPINE FINDINGS Segmentation: 5 lumbar type vertebrae. Alignment: Grade 1 anterolisthesis at L4-5 Vertebrae: Chronic compression deformity of L1. Paraspinal and other soft tissues: Calcific aortic atherosclerosis. Disc levels: No spinal canal stenosis. IMPRESSION: 1. T1 and T6 compression fractures with 10-20% height loss, new since the prior study. No retropulsion or spinal canal stenosis. 2. Multiple other chronic compression deformities of the thoracic and lumbar spine. 3. No acute fracture or static subluxation of the cervical spine. Aortic Atherosclerosis (ICD10-I70.0). Electronically Signed   By: Deatra Robinson M.D.   On: 07/05/2021 01:16   CT Thoracic Spine Wo Contrast  Result Date: 07/05/2021 CLINICAL DATA:  Trauma EXAM: CT CERVICAL, THORACIC, AND LUMBAR SPINE WITHOUT CONTRAST TECHNIQUE: Multidetector CT imaging of the cervical, thoracic and lumbar spine was performed without intravenous contrast. Multiplanar CT image reconstructions were also generated. COMPARISON:  04/13/2021 FINDINGS: CT CERVICAL SPINE FINDINGS Alignment: Normal. Skull base and vertebrae: No acute fracture. No primary bone lesion or focal  pathologic process. Soft tissues and spinal canal: No prevertebral fluid or swelling. No visible canal hematoma. Disc levels:  No spinal canal stenosis. CT THORACIC SPINE FINDINGS Alignment: Normal. Vertebrae: There is a wedge compression fracture of T1 with less than 20% loss, involving the anterior wall and superior endplate. No retropulsion. There is also a fracture at T6 with approximately 25% height loss that is new since the prior study. The fracture involves the anterior and posterior walls and the superior endplate. Lower thoracic compression deformities are unchanged. Paraspinal and other soft tissues: Calcific aortic atherosclerosis Disc levels: No spinal canal stenosis CT LUMBAR SPINE FINDINGS Segmentation: 5 lumbar type vertebrae. Alignment: Grade 1 anterolisthesis at L4-5 Vertebrae: Chronic compression deformity of L1. Paraspinal and other soft tissues: Calcific aortic atherosclerosis. Disc levels: No spinal canal stenosis. IMPRESSION: 1. T1 and T6 compression fractures with 10-20% height loss, new since the prior study. No retropulsion or spinal canal stenosis. 2. Multiple other chronic compression deformities of the thoracic and lumbar spine. 3. No acute fracture or static subluxation of the cervical spine. Aortic Atherosclerosis (ICD10-I70.0). Electronically Signed   By: Deatra Robinson M.D.   On: 07/05/2021 01:16   CT Lumbar Spine Wo Contrast  Result Date: 07/05/2021 CLINICAL DATA:  Trauma EXAM: CT CERVICAL, THORACIC, AND LUMBAR SPINE WITHOUT CONTRAST TECHNIQUE: Multidetector CT imaging of the cervical, thoracic and lumbar spine was performed without intravenous contrast. Multiplanar CT image reconstructions were also generated. COMPARISON:  04/13/2021 FINDINGS: CT CERVICAL SPINE FINDINGS Alignment: Normal. Skull base and vertebrae: No acute fracture. No primary bone lesion or focal pathologic process. Soft tissues and spinal canal: No prevertebral fluid or swelling. No visible canal hematoma.  Disc levels:  No spinal canal stenosis. CT THORACIC SPINE FINDINGS Alignment: Normal. Vertebrae: There is a wedge compression fracture of T1 with less than 20% loss, involving the anterior wall and superior endplate. No retropulsion. There is also a fracture at T6 with approximately 25% height loss that is new since the prior study. The fracture involves  the anterior and posterior walls and the superior endplate. Lower thoracic compression deformities are unchanged. Paraspinal and other soft tissues: Calcific aortic atherosclerosis Disc levels: No spinal canal stenosis CT LUMBAR SPINE FINDINGS Segmentation: 5 lumbar type vertebrae. Alignment: Grade 1 anterolisthesis at L4-5 Vertebrae: Chronic compression deformity of L1. Paraspinal and other soft tissues: Calcific aortic atherosclerosis. Disc levels: No spinal canal stenosis. IMPRESSION: 1. T1 and T6 compression fractures with 10-20% height loss, new since the prior study. No retropulsion or spinal canal stenosis. 2. Multiple other chronic compression deformities of the thoracic and lumbar spine. 3. No acute fracture or static subluxation of the cervical spine. Aortic Atherosclerosis (ICD10-I70.0). Electronically Signed   By: Ulyses Jarred M.D.   On: 07/05/2021 01:16    ____________________________________________   PROCEDURES  Procedure(s) performed:   Procedures  CRITICAL CARE Performed by: Margette Fast Total critical care time: 35 minutes Critical care time was exclusive of separately billable procedures and treating other patients. Critical care was necessary to treat or prevent imminent or life-threatening deterioration. Critical care was time spent personally by me on the following activities: development of treatment plan with patient and/or surrogate as well as nursing, discussions with consultants, evaluation of patient's response to treatment, examination of patient, obtaining history from patient or surrogate, ordering and performing  treatments and interventions, ordering and review of laboratory studies, ordering and review of radiographic studies, pulse oximetry and re-evaluation of patient's condition.  Nanda Quinton, MD Emergency Medicine  ____________________________________________   INITIAL IMPRESSION / ASSESSMENT AND PLAN / ED COURSE  Pertinent labs & imaging results that were available during my care of the patient were reviewed by me and considered in my medical decision making (see chart for details).   Patient presents to the emergency department with back pain and EMS report of 2 falls today.  Patient denies falling and states describes sliding to the floor when she was getting up from the toilet.  No syncope reported.  She is describing some sternum pain which she presents is musculoskeletal but have considered ACS is a possibility.  Will get EKG and lab work added onto the lumbar and thoracic plain films ordered from the [facility] process.  The patient has chronic compression fractures on those studies but seems similar to prior evaluations.  Will obtain CT imaging through the spine as well as CT scan of the head.  No outward signs of head trauma.  No exam findings to suspect stroke or acute spine emergency requiring MRI. Plan for labs, pain control, and reassess.   02:45 AM  Pain is well controlled here and less the patient is moving or touching the area.  Her troponin has come back greater than 700.  EKG shows no acute ischemic change.  Went to talk with her and her husband, now at bedside.  Patient is not having any chest pain and is only having discomfort when she touches her lower sternum.  The history seems more musculoskeletal but with this troponin elevation and remote history of CAD plan to start heparin and speak with cardiology.  CT imaging of the head is unremarkable.  CT imaging of the thoracic spine shows some acute compression fracture with fairly minimal height loss.  No retropulsion.  No neurodeficits.   Patient will ultimately need PT/OT but will need Cardiology evaluation as well.   Cardiology note from Dr. Harrington Challenger in 07/2020 reviewed. Patient had PCI in 1993 with most recent stress test in 2013.   03:30 AM  Spoke with Cardiology  on call, Dr. Kalman Shan. Agree pain is very atypical for ACS. Will send for CTA PE study looking for alternate etiology of elevated troponin.   04:00 AM  Second troponin resulting now at 1,626. CTA pending. Plan for med admit with compression fx and pain along with atypical ACS presentation.   Discussed patient's case with [facility] to request admission. Patient and family (if present) updated with plan. Care transferred to [facility] service.  I reviewed all nursing notes, vitals, pertinent old records, EKGs, labs, imaging (as available).   ____________________________________________  FINAL CLINICAL IMPRESSION(S) / ED DIAGNOSES  Final diagnoses:  Fall, initial encounter  Acute midline thoracic back pain  Precordial chest pain  Elevated troponin  Elevated alkaline phosphatase level     MEDICATIONS GIVEN DURING THIS VISIT:  Medications  fentaNYL (SUBLIMAZE) injection 50 mcg (50 mcg Intravenous Given 07/05/21 1605)  sodium chloride (PF) 0.9 % injection (  Not Given 07/05/21 0715)  acetaminophen (TYLENOL) tablet 650 mg (650 mg Oral Given 07/08/21 1258)    Or  acetaminophen (TYLENOL) suppository 650 mg ( Rectal See Alternative 07/08/21 1258)  naloxone (NARCAN) injection 0.4 mg (has no administration in time range)  ondansetron (ZOFRAN) injection 4 mg (has no administration in time range)  atorvastatin (LIPITOR) tablet 80 mg (80 mg Oral Given 07/09/21 0812)  aspirin EC tablet 81 mg (81 mg Oral Given 07/09/21 0812)  metoprolol tartrate (LOPRESSOR) tablet 12.5 mg (12.5 mg Oral Given 07/09/21 0812)  losartan (COZAAR) tablet 50 mg (50 mg Oral Given 07/09/21 0812)  metoprolol tartrate (LOPRESSOR) injection 5 mg (5 mg Intravenous Given 07/08/21 0416)  heparin injection 5,000 Units (5,000  Units Subcutaneous Given 07/09/21 0534)  MEDLINE mouth rinse (15 mLs Mouth Rinse Given 07/09/21 0813)  amLODipine (NORVASC) tablet 5 mg (5 mg Oral Given 07/09/21 0812)  hydrALAZINE (APRESOLINE) injection 10 mg (10 mg Intravenous Given 07/08/21 1847)  polyethylene glycol (MIRALAX / GLYCOLAX) packet 17 g (17 g Oral Given 07/09/21 0813)  senna (SENOKOT) tablet 8.6 mg (8.6 mg Oral Given 07/09/21 0812)  sodium chloride 0.9 % bolus 500 mL (0 mLs Intravenous Stopped 07/05/21 0303)  fentaNYL (SUBLIMAZE) injection 50 mcg (50 mcg Intravenous Given 07/05/21 0100)  heparin bolus via infusion 2,500 Units (2,500 Units Intravenous Bolus from Bag 07/05/21 0333)  iohexol (OMNIPAQUE) 350 MG/ML injection 70 mL (70 mLs Intravenous Contrast Given 07/05/21 0619)  aspirin chewable tablet 324 mg (324 mg Oral Given 07/05/21 0951)  heparin bolus via infusion 1,000 Units (1,000 Units Intravenous Bolus from Bag 07/05/21 1238)  bisacodyl (DULCOLAX) suppository 10 mg (10 mg Rectal Given 07/08/21 1839)      Note:  This document was prepared using Dragon voice recognition software and may include unintentional dictation errors.  Nanda Quinton, MD, [facility]    Marven Veley, Wonda Olds, MD 07/09/21 [phone]

## 2021-07-05 NOTE — Consult Note (Addendum)
Cardiology Consultation:   Patient ID: Natasha Harper MRN: [id number]; DOB: 1935/02/07  Admit date: 07/04/2021 Date of Consult: 07/05/2021  PCP:  [name], MD   [facility] Providers Cardiologist:  Dietrich Pates, MD   {  Patient Profile:   Natasha Harper is a 86 y.o. female with a hx of CAD s/p remote PCI to RCA and diagonal, carotid plaque, hypothyroidism, hyperlipidemia and recurrent fall who is being seen 07/05/2021 for the evaluation of non-STEMI at the request of Dr. Arlean Hopping.  History of CAD s/p PCI to RCA and diagonal in 1993.  Has residual 70% LAD lesion.  No ischemia on Myoview in 2007 & 2013.  Carotid doppler January 2022 showed stable 1 to 39% right ICA stenosis and mild plaquing of left ICA.  History of Present Illness:   Natasha Harper has prior mechanical fall with history of chronic vertebral compression fracture.  Admitted October 2022 after another mechanical fall.  CT showed multiple old compression thoracic and lumbar compression fractures along with sacral fracture at S1/S2.  Neurosurgeon recommended conservative management.  Patient refused SNF and discharged home.  The patient use walker for ambulation at baseline.  She has balance issue due to chronic back and leg pain.   Patient fall last night/yesterday x 2.  First episode occurred when she tried to get up from chair but lost balance and fall due to leg giving out.  Husband tried to catch her but could not support her and fall into ground.  She did not hit her head.  No loss of consciousness.  Son-in-law and husband help her to move.  Had another episode last night while getting up from commode.  Commode seat was broken.  She tried to stood up but lost balance and fall forward.  Husband caught her before she can hit her head.  No loss of consciousness.  Patient complains severe back pain and EMS was activated.  No prodromal symptoms of chest pain, shortness of breath, palpitation or dizziness.  However,  complained of lower sternal area pain with palpation.   CT of lumbar spine showed new T1 and T6 compression fracture with 10 to 20% height loss.  There was multiple chronic compression fracture of thoracic and lumbar spine  CT angio of chest without evidence of pulmonary embolism.  There was left main and two-vessel CAD.  Right breast attenuation.   Hs-troponin 739>>1626>>2675.  Case discussed with fellow overnight and started on heparin.  Patient denies chest pain or shortness of breath.  Alkaline phosphatase elevated at 606.  Hemoglobin 9.8 COVID and influenza panel are negative Chest x-ray showed cardiomegaly with vascular congestion  Past Medical History:  Diagnosis Date   CAD (coronary artery disease)    PCI to RCA and diagonal in remote past, residual 70% LAD  /   nuclear, 2007, no ischemia   Carotid arterial disease (HCC)    Doppler, June, 2011, stable, 60-79% R. ICA, 40-59% LICA   Contact lens/glasses fitting    wears contacts or glasses   Dyslipidemia    Significant drop in LDL from Lipitor even though LDL remains high   Ejection fraction    EF 65%, nuclear, 2007   Fibrocystic breast    HTN (hypertension)     no med   Hypercholesterolemia    Hypothyroidism    Thyroid surgery in the past, thyroid nodules followed by Dr.Ellison   Lump or mass in breast 07/19/2007   Excised 01/12/13. B9 on pathQualifier: Diagnosis of  By:  Panosh MD, Neta Mends     Osteoporosis    Prominent abdominal aortic pulsation    No abdominal aneurysm by ultrasound   Rectal fissure     Past Surgical History:  Procedure Laterality Date   BREAST BIOPSY Right 01/12/2013   Procedure: Removal of right breast mass;  Surgeon: Currie Paris, MD;  Location: [facility];  Service: General;  Laterality: Right;   BREAST EXCISIONAL BIOPSY  9/14   scar tissue from the needle biopsy   CORONARY ANGIOPLASTY  1993   RECTOPERITONEAL FISTULA CLOSURE     THYROID CYST EXCISION     Inpatient  Medications: Scheduled Meds:  aspirin  324 mg Oral Once   [START ON 07/06/2021] aspirin EC  81 mg Oral Daily   atorvastatin  80 mg Oral Daily   sodium chloride (PF)       Continuous Infusions:  heparin 550 Units/hr (07/05/21 0331)   PRN Meds: acetaminophen **OR** acetaminophen, fentaNYL (SUBLIMAZE) injection, naLOXone (NARCAN)  injection, ondansetron (ZOFRAN) IV  Allergies:    Allergies  Allergen Reactions   Actonel [Risedronate Sodium]     bloating   Evista [Raloxifene]     Abdominal cramps   Fosamax [Alendronate Sodium]     Abdominal cramps    Social History:   Social History   Socioeconomic History   Marital status: Married    Spouse name: Not on file   Number of children: Not on file   Years of education: Not on file   Highest education level: Not on file  Occupational History   Not on file  Tobacco Use   Smoking status: Former    Types: Cigarettes    Quit date: 07/07/1958    Years since quitting: 63.0   Smokeless tobacco: Never  Substance and Sexual Activity   Alcohol use: No   Drug use: No   Sexual activity: Yes    Partners: Male  Other Topics Concern   Not on file  Social History Narrative   HH     Of 2  No pets    Family from [place]    Exercise   Walks every day at least 30 minutes  2 miles .   Sleep  Ok 10 - 7    Just moved to [place] [facility]   Financial Resource Strain: Not on file  Food Insecurity: Not on file  Transportation Needs: Not on file  Physical Activity: Not on file  Stress: Not on file  Social Connections: Not on file  Intimate Partner Violence: Not on file    Family History:   Family History  Problem Relation Age of Onset   Heart attack Father    Osteoporosis Mother    Breast cancer Daughter     ROS:  Please see the history of present illness.  All other ROS reviewed and negative.     Physical Exam/Data:   Vitals:   07/05/21 0430 07/05/21 0500 07/05/21 0700 07/05/21 0730  BP: (!) 162/75  (!) 173/77 (!) 175/79 (!) 165/72  Pulse: 84 87 86 85  Resp: (!) 23 (!) 24 19 15   Temp:      TempSrc:      SpO2: 94% 99% 98% 96%    Intake/Output Summary (Last 24 hours) at 07/05/2021 0846 Last data filed at 07/05/2021 0303 Gross per 24 hour  Intake 500 ml  Output --  Net 500 ml   Last 3 Weights 04/13/2021 03/05/2021 02/08/2021  Weight (  lbs) 101 lb 6.6 oz 111 lb 6.4 oz 115 lb 1.3 oz  Weight (kg) 46 kg 50.531 kg 52.2 kg     There is no height or weight on file to calculate BMI.  General: Thin frail elderly female in no acute distress HEENT: normal Neck: no JVD Vascular: No carotid bruits; Distal pulses 2+ bilaterally Cardiac:  normal S1, S2; RRR; no murmur  Lungs:  clear to auscultation bilaterally Abd: soft, nontender, no hepatomegaly  Ext: no edema Musculoskeletal:  No deformities, BUE and BLE strength normal and equal Skin: warm and dry  Neuro:  CNs 2-12 intact, no focal abnormalities noted Psych:  Normal affect   EKG:  The EKG was personally reviewed and demonstrates:  SR, LVH Telemetry:  Telemetry was personally reviewed and demonstrates:  NSR  Relevant CV Studies: As summarized above   Laboratory Data:  High Sensitivity Troponin:   Recent Labs  Lab 07/05/21 0102 07/05/21 0244 07/05/21 0645  TROPONINIHS 739* 1,626* 2,675*     Chemistry Recent Labs  Lab 07/05/21 0244  NA 131*  K 4.4  CL 100  CO2 23  GLUCOSE 117*  BUN 12  CREATININE 0.31*  CALCIUM 8.8*  GFRNONAA >60  ANIONGAP 8    Recent Labs  Lab 07/05/21 0244  PROT 6.9  ALBUMIN 3.2*  AST 67*  ALT 52*  ALKPHOS 606*  BILITOT 0.9   Lipids No results for input(s): CHOL, TRIG, HDL, LABVLDL, LDLCALC, CHOLHDL in the last 168 hours.  Hematology Recent Labs  Lab 07/05/21 0102  WBC 8.1  RBC 2.83*  HGB 9.8*  HCT 29.4*  MCV 103.9*  MCH 34.6*  MCHC 33.3  RDW 13.3  PLT 362    Radiology/Studies:  DG Chest 2 View  Result Date: 07/05/2021 CLINICAL DATA:  Chest pain.  Fall. EXAM: CHEST - 2  VIEW COMPARISON:  None. FINDINGS: Left lung base density, likely atelectasis or infiltrate. Trace left pleural effusion may be present. No pneumothorax. There is cardiomegaly with vascular congestion. Atherosclerotic calcification of the aorta. Osteopenia with degenerative changes of the spine. Multilevel age indeterminate compression fractures. IMPRESSION: 1. Left lung base atelectasis versus infiltrate. 2. Cardiomegaly with vascular congestion. 3. Multilevel age indeterminate compression fractures. Electronically Signed   By: Elgie Collard M.D.   On: 07/05/2021 00:40   DG Thoracic Spine 2 View  Result Date: 07/05/2021 CLINICAL DATA:  Low back pain after 2 falls today. Limited movement. EXAM: THORACIC SPINE 2 VIEWS COMPARISON:  CT thoracic spine 04/13/2021 FINDINGS: Normal alignment of the thoracic spine. Diffuse bone demineralization. Multiple vertebral compression fractures demonstrated at T12, T11, T10, T9, T8, T7, and T6 levels. Mild endplate compression at T5 and T4. Similar appearance to previous study. No definite new fractures. No abnormal paraspinal soft tissue swelling. Due to bone demineralization, CT would be more sensitive for evaluation of possible progression if clinically indicated. IMPRESSION: Diffuse bone demineralization with multiple thoracic vertebral compression fractures, similar to prior study. Changes likely represent osteoporosis. Electronically Signed   By: Burman Nieves M.D.   On: 07/05/2021 00:00   DG Lumbar Spine Complete  Result Date: 07/04/2021 CLINICAL DATA:  Back pain.  Two falls today. EXAM: LUMBAR SPINE - COMPLETE 4+ VIEW COMPARISON:  CT 04/13/2021 FINDINGS: Diffuse bone demineralization. Five lumbar type vertebral bodies. Compression fractures again demonstrated at L1 without change. Mild superior endplate compression demonstrated at L2, L4, and L5, similar to prior study. Alignment is unchanged. Overlying bowel gas limits visualization of the sacrum. Prominent  vascular calcifications. IMPRESSION:  Diffuse bone demineralization with multiple compression fractures, similar to prior study. Changes likely due to osteoporosis. No definite acute fractures. Electronically Signed   By: Burman Nieves M.D.   On: 07/04/2021 23:58   CT Head Wo Contrast  Result Date: 07/05/2021 CLINICAL DATA:  Multiple falls. EXAM: CT HEAD WITHOUT CONTRAST TECHNIQUE: Contiguous axial images were obtained from the base of the skull through the vertex without intravenous contrast. COMPARISON:  None. FINDINGS: Brain: There is moderate severity cerebral atrophy with widening of the extra-axial spaces and ventricular dilatation. There are areas of decreased attenuation within the white matter tracts of the supratentorial brain, consistent with microvascular disease changes. Vascular: No hyperdense vessel or unexpected calcification. Skull: Negative for acute fracture. A 9 mm x 4 mm benign-appearing area of focal cortical thickening is seen within left frontal region. Sinuses/Orbits: No acute finding. Other: None. IMPRESSION: 1. Generalized cerebral atrophy and microvascular disease changes of the supratentorial brain. 2. No acute intracranial abnormality. Electronically Signed   By: Aram Candela M.D.   On: 07/05/2021 02:30   CT Angio Chest PE W and/or Wo Contrast  Result Date: 07/05/2021 CLINICAL DATA:  86 year old female with clinical high suspicion of pulmonary embolism. EXAM: CT ANGIOGRAPHY CHEST WITH CONTRAST TECHNIQUE: Multidetector CT imaging of the chest was performed using the standard protocol during bolus administration of intravenous contrast. Multiplanar CT image reconstructions and MIPs were obtained to evaluate the vascular anatomy. CONTRAST:  70mL OMNIPAQUE IOHEXOL 350 MG/ML SOLN COMPARISON:  No priors. FINDINGS: Cardiovascular: No filling defects are noted in the pulmonary arterial tree to suggest pulmonary embolism. Heart size is normal. There is no significant pericardial  fluid, thickening or pericardial calcification. There is aortic atherosclerosis, as well as atherosclerosis of the great vessels of the mediastinum and the coronary arteries, including calcified atherosclerotic plaque in the left main, left anterior descending and right coronary arteries. Mediastinum/Nodes: No pathologically enlarged mediastinal or hilar lymph nodes. Hilar esophagus is unremarkable in appearance. No axillary lymphadenopathy. Lungs/Pleura: No acute consolidative airspace disease. No pleural effusions. Dependent areas of subsegmental atelectasis lying in the lower lobes of the lungs bilaterally. No definite suspicious appearing pulmonary nodules or masses are noted. Upper Abdomen: Aortic atherosclerosis. Musculoskeletal: In the right breast (axial image 88 of series 4) there is a 2.0 x 1.9 cm high attenuation (83 HU) lesion. Multiple chronic appearing compression fractures are noted in the thoracic spine involving the superior endplate of T1, T5, superior endplate of T6, T7, T8, T9, T10 and T11, most severe at T11 where there is 70% loss of anterior vertebral body height. There are no aggressive appearing lytic or blastic lesions noted in the visualized portions of the skeleton. Review of the MIP images confirms the above findings. IMPRESSION: 1. No evidence of pulmonary embolism. 2. Widespread dependent atelectasis in the lower lobes of the lungs bilaterally. 3. Aortic atherosclerosis, in addition to left main and 2 vessel coronary artery disease. 4. 2.0 x 1.9 cm high attenuation lesion in the right breast. This is nonspecific, but warrants further evaluation with follow-up mammography and/or breast ultrasound in the near future to exclude neoplasm. Aortic Atherosclerosis (ICD10-I70.0). Electronically Signed   By: Trudie Reed M.D.   On: 07/05/2021 07:01   CT Cervical Spine Wo Contrast  Result Date: 07/05/2021 CLINICAL DATA:  Trauma EXAM: CT CERVICAL, THORACIC, AND LUMBAR SPINE WITHOUT  CONTRAST TECHNIQUE: Multidetector CT imaging of the cervical, thoracic and lumbar spine was performed without intravenous contrast. Multiplanar CT image reconstructions were also generated. COMPARISON:  04/13/2021  FINDINGS: CT CERVICAL SPINE FINDINGS Alignment: Normal. Skull base and vertebrae: No acute fracture. No primary bone lesion or focal pathologic process. Soft tissues and spinal canal: No prevertebral fluid or swelling. No visible canal hematoma. Disc levels:  No spinal canal stenosis. CT THORACIC SPINE FINDINGS Alignment: Normal. Vertebrae: There is a wedge compression fracture of T1 with less than 20% loss, involving the anterior wall and superior endplate. No retropulsion. There is also a fracture at T6 with approximately 25% height loss that is new since the prior study. The fracture involves the anterior and posterior walls and the superior endplate. Lower thoracic compression deformities are unchanged. Paraspinal and other soft tissues: Calcific aortic atherosclerosis Disc levels: No spinal canal stenosis CT LUMBAR SPINE FINDINGS Segmentation: 5 lumbar type vertebrae. Alignment: Grade 1 anterolisthesis at L4-5 Vertebrae: Chronic compression deformity of L1. Paraspinal and other soft tissues: Calcific aortic atherosclerosis. Disc levels: No spinal canal stenosis. IMPRESSION: 1. T1 and T6 compression fractures with 10-20% height loss, new since the prior study. No retropulsion or spinal canal stenosis. 2. Multiple other chronic compression deformities of the thoracic and lumbar spine. 3. No acute fracture or static subluxation of the cervical spine. Aortic Atherosclerosis (ICD10-I70.0). Electronically Signed   By: Deatra Robinson M.D.   On: 07/05/2021 01:16   CT Thoracic Spine Wo Contrast  Result Date: 07/05/2021 CLINICAL DATA:  Trauma EXAM: CT CERVICAL, THORACIC, AND LUMBAR SPINE WITHOUT CONTRAST TECHNIQUE: Multidetector CT imaging of the cervical, thoracic and lumbar spine was performed without  intravenous contrast. Multiplanar CT image reconstructions were also generated. COMPARISON:  04/13/2021 FINDINGS: CT CERVICAL SPINE FINDINGS Alignment: Normal. Skull base and vertebrae: No acute fracture. No primary bone lesion or focal pathologic process. Soft tissues and spinal canal: No prevertebral fluid or swelling. No visible canal hematoma. Disc levels:  No spinal canal stenosis. CT THORACIC SPINE FINDINGS Alignment: Normal. Vertebrae: There is a wedge compression fracture of T1 with less than 20% loss, involving the anterior wall and superior endplate. No retropulsion. There is also a fracture at T6 with approximately 25% height loss that is new since the prior study. The fracture involves the anterior and posterior walls and the superior endplate. Lower thoracic compression deformities are unchanged. Paraspinal and other soft tissues: Calcific aortic atherosclerosis Disc levels: No spinal canal stenosis CT LUMBAR SPINE FINDINGS Segmentation: 5 lumbar type vertebrae. Alignment: Grade 1 anterolisthesis at L4-5 Vertebrae: Chronic compression deformity of L1. Paraspinal and other soft tissues: Calcific aortic atherosclerosis. Disc levels: No spinal canal stenosis. IMPRESSION: 1. T1 and T6 compression fractures with 10-20% height loss, new since the prior study. No retropulsion or spinal canal stenosis. 2. Multiple other chronic compression deformities of the thoracic and lumbar spine. 3. No acute fracture or static subluxation of the cervical spine. Aortic Atherosclerosis (ICD10-I70.0). Electronically Signed   By: Deatra Robinson M.D.   On: 07/05/2021 01:16   CT Lumbar Spine Wo Contrast  Result Date: 07/05/2021 CLINICAL DATA:  Trauma EXAM: CT CERVICAL, THORACIC, AND LUMBAR SPINE WITHOUT CONTRAST TECHNIQUE: Multidetector CT imaging of the cervical, thoracic and lumbar spine was performed without intravenous contrast. Multiplanar CT image reconstructions were also generated. COMPARISON:  04/13/2021 FINDINGS:  CT CERVICAL SPINE FINDINGS Alignment: Normal. Skull base and vertebrae: No acute fracture. No primary bone lesion or focal pathologic process. Soft tissues and spinal canal: No prevertebral fluid or swelling. No visible canal hematoma. Disc levels:  No spinal canal stenosis. CT THORACIC SPINE FINDINGS Alignment: Normal. Vertebrae: There is a wedge compression fracture of T1  with less than 20% loss, involving the anterior wall and superior endplate. No retropulsion. There is also a fracture at T6 with approximately 25% height loss that is new since the prior study. The fracture involves the anterior and posterior walls and the superior endplate. Lower thoracic compression deformities are unchanged. Paraspinal and other soft tissues: Calcific aortic atherosclerosis Disc levels: No spinal canal stenosis CT LUMBAR SPINE FINDINGS Segmentation: 5 lumbar type vertebrae. Alignment: Grade 1 anterolisthesis at L4-5 Vertebrae: Chronic compression deformity of L1. Paraspinal and other soft tissues: Calcific aortic atherosclerosis. Disc levels: No spinal canal stenosis. IMPRESSION: 1. T1 and T6 compression fractures with 10-20% height loss, new since the prior study. No retropulsion or spinal canal stenosis. 2. Multiple other chronic compression deformities of the thoracic and lumbar spine. 3. No acute fracture or static subluxation of the cervical spine. Aortic Atherosclerosis (ICD10-I70.0). Electronically Signed   By: Deatra Robinson M.D.   On: 07/05/2021 01:16     Assessment and Plan:   NSTEMI  -Hs-troponin 739>>1626>>2675 -Thankfully, patient is asymptomatic.  She denies chest pain, shortness of breath, palpitation or dizziness.  This is in setting after mechanical fall. -Does have history of CAD with prior stenting to RCA and diagonal in 1993 with residual LAD disease.  CT angio of chest showing left main and two-vessel CAD. -Patient is asymptomatic.  Her lower sternal pain is due to musculoskeletal etiology.  She  does not have symptoms concerning of angina.  We recommended medical treatment as of now.  Continue IV heparin for 48 hours.  We will get echocardiogram to assess LV function.  If evidence of LV dysfunction or wall motion abnormality, will plan cardiac catheterization this admission. -We will give aspirin 324 mg now and then 81 mg daily -Start metoprolol 12.5 mg twice daily -Continue home Lipitor 80 mg daily  2.  Hypertension -Patient does not take any antihypertensive regimen at home.  Reports normal blood pressures at home. -Start beta-blocker as above -Likely elevated blood pressure is due to pain  3. HLD - 10/22/2020: Cholesterol 178; HDL 57.50; LDL Cholesterol 107; Triglycerides 68.0; VLDL 13.6  - Check lipid panel - Continue Lipitor  qd  4.  Elevated alkaline phosphatase 5.  Mechanical fall/compression fracture 6. Hypothyroidism -Per primary team   Risk Assessment/Risk Scores:     TIMI Risk Score for Unstable Angina or Non-ST Elevation MI:   The patient's TIMI risk score is 5, which indicates a 26% risk of all cause mortality, new or recurrent myocardial infarction or need for urgent revascularization in the next 14 days.{   For questions or updates, please contact [facility] Please consult [email or web address] for contact info under    Lorelei Pont, [place]  07/05/2021 8:46 AM   Attending Note:   The patient was seen and examined.  Agree with assessment and plan as noted above.  Changes made to the above note as needed.  Patient seen and independently examined with Chelsea Aus, PA .   We discussed all aspects of the encounter. I agree with the assessment and plan as stated above.     NSTEMI:   she has underlying CAD .  This seems to be more of a demand ischemia event with underlying known CAD .   She has not had any angina - still does not have angian.   Just soreness from falling and sustaining more compression fractures. She is very frail and is a poor  candidate for any invasive procedures.   I think a  conservative, medical approach is best. Echo today  Heparin for 48 hours.  Could consider plavix 75 mg a day ( instead of ASA ) at some point . We have started PO metoprolol - titrate as needed, as tolerated She has normal renal function.  I would favor an ARB over amlidipine at this point .  Will start Losartan 50 mg a day   2.  HTN:  BP is up today  - likely due to her back pain .  I think she may do well with Losartan instead of amlodipine . Will start with Losartan 50 mg a day   3.  HLD:  cont atorvastatin  80 a day   4.  Generalized weakness:   she has fallen 4 times.  She will need further evaluation .  I suspect she may need to go to a SNf at some point soon     I have spent a total of 40 minutes with patient reviewing hospital  notes , telemetry, EKGs, labs and examining patient as well as establishing an assessment and plan that was discussed with the patient.  > 50% of time was spent in direct patient care.    Vesta Mixer, Montez Hageman., MD, [facility] 07/05/2021, 10:50 AM [address],  [address] Office [phone] Pager [phone]

## 2021-07-05 NOTE — Progress Notes (Signed)
ANTICOAGULATION CONSULT NOTE - Follow Up Consult  Pharmacy Consult for Heparin Indication: chest pain/ACS  Allergies  Allergen Reactions   Actonel [Risedronate Sodium]     bloating   Evista [Raloxifene]     Abdominal cramps   Fosamax [Alendronate Sodium]     Abdominal cramps    Patient Measurements: Height: 5\' 1"  (154.9 cm) Weight: 46 kg (101 lb 6.6 oz) IBW/kg (Calculated) : 47.8 Heparin Dosing Weight: TBW  Vital Signs: Temp: 98.1 F (36.7 C) (12/30 2001) Temp Source: Oral (12/30 2001) BP: 158/75 (12/30 2001) Pulse Rate: 77 (12/30 2001)  Labs: Recent Labs    07/05/21 0102 07/05/21 0244 07/05/21 0315 07/05/21 0645 07/05/21 0835 07/05/21 1018 07/05/21 1343 07/05/21 2000  HGB 9.8*  --   --   --   --   --   --   --   HCT 29.4*  --   --   --   --   --   --   --   PLT 362  --   --   --   --   --   --   --   APTT  --   --  23*  --   --   --   --   --   LABPROT  --   --  14.0  --   --   --   --   --   INR  --   --  1.1  --   --   --   --   --   HEPARINUNFRC  --   --   --   --   --  <0.10*  --  0.32  CREATININE  --  0.31*  --   --   --   --   --   --   CKTOTAL  --   --   --   --   --  283*  --   --   TROPONINIHS 739* 1,626*  --    < > 2,144* 2,434* 2,444*  --    < > = values in this interval not displayed.     Estimated Creatinine Clearance: 38 mL/min (A) (by C-G formula based on SCr of 0.31 mg/dL (L)).   Medications:  Infusions:   heparin 700 Units/hr (07/05/21 1239)    Assessment: 58 yoF with past medical history including CAD, hypertension, high cholesterol presents to the ED with mid and lower back pain as well as chest discomfort.  She arrives by EMS who report 2 falls today.  Pharmacy consulted to dose heparin for ACS/STEMI (48 hrs per cardiology).  No prior AC noted.  Heparin level therapeutic but on lower end at 0.32.  Goal of Therapy:  Heparin level 0.3-0.7 units/ml Monitor platelets by anticoagulation protocol: Yes   Plan:  Increase heparin to  750 units/h Recheck with am labs  97, PharmD, BCPS, [facility] Clinical Pharmacist [phone] Please check AMION for all [facility] numbers 07/05/2021

## 2021-07-05 NOTE — Progress Notes (Signed)
ANTICOAGULATION CONSULT NOTE - Initial Consult  Pharmacy Consult for heparin Indication: chest pain/ACS  Allergies  Allergen Reactions   Actonel [Risedronate Sodium]     bloating   Evista [Raloxifene]     Abdominal cramps   Fosamax [Alendronate Sodium]     Abdominal cramps    Patient Measurements:   Heparin Dosing Weight: 46kg  Vital Signs: Temp: 97.7 F (36.5 C) (12/29 2245) Temp Source: Oral (12/29 2245) BP: 169/78 (12/30 0200) Pulse Rate: 96 (12/30 0200)  Labs: Recent Labs    07/05/21 0102  HGB 9.8*  HCT 29.4*  PLT 362  TROPONINIHS 739*    CrCl cannot be calculated (Patient's most recent lab result is older than the maximum 21 days allowed.).   Medical History: Past Medical History:  Diagnosis Date   CAD (coronary artery disease)    PCI to RCA and diagonal in remote past, residual 70% LAD  /   nuclear, 2007, no ischemia   Carotid arterial disease (HCC)    Doppler, June, 2011, stable, 60-79% R. ICA, 40-59% LICA   Contact lens/glasses fitting    wears contacts or glasses   Dyslipidemia    Significant drop in LDL from Lipitor even though LDL remains high   Ejection fraction    EF 65%, nuclear, 2007   Fibrocystic breast    HTN (hypertension)     no med   Hypercholesterolemia    Hypothyroidism    Thyroid surgery in the past, thyroid nodules followed by Dr.Ellison   Lump or mass in breast 07/19/2007   Excised 01/12/13. B9 on pathQualifier: Diagnosis of  By: Fabian Sharp MD, Neta Mends     Osteoporosis    Prominent abdominal aortic pulsation    No abdominal aneurysm by ultrasound   Rectal fissure      Assessment:  86 y.o. female with past medical history including CAD, hypertension, high cholesterol presents to the ED with mid and lower back pain as well as chest discomfort.  She arrives by EMS who report 2 falls today.  Pharmacy consulted to dose heparin for ACS/STEMI.  No prior AC noted  Hgb 9.8, Plts WNL, Trop 739  Goal of Therapy:  Heparin level 0.3-0.7  units/ml Monitor platelets by anticoagulation protocol: Yes   Plan:  Baseline labs ordered STAT Heparin bolus 2500 units x 1 Start heparin drip at 550 units/hr Heparin level in 8 hours Daily CBC  Arley Phenix RPh 07/05/2021, 3:05 AM

## 2021-07-05 NOTE — ED Notes (Signed)
Provided w lunch tray

## 2021-07-05 NOTE — ED Notes (Signed)
Troponin 2,675, asked secretary to page MD.

## 2021-07-05 NOTE — ED Notes (Signed)
Cardiology MD at bedside.

## 2021-07-05 NOTE — Progress Notes (Signed)
ANTICOAGULATION CONSULT NOTE - Follow Up Consult  Pharmacy Consult for Heparin Indication: chest pain/ACS  Allergies  Allergen Reactions   Actonel [Risedronate Sodium]     bloating   Evista [Raloxifene]     Abdominal cramps   Fosamax [Alendronate Sodium]     Abdominal cramps    Patient Measurements: Height: 5\' 1"  (154.9 cm) Weight: 46 kg (101 lb 6.6 oz) IBW/kg (Calculated) : 47.8 Heparin Dosing Weight: TBW  Vital Signs: BP: 190/83 (12/30 1100) Pulse Rate: 79 (12/30 1100)  Labs: Recent Labs    07/05/21 0102 07/05/21 0244 07/05/21 0315 07/05/21 0645 07/05/21 0835 07/05/21 1018  HGB 9.8*  --   --   --   --   --   HCT 29.4*  --   --   --   --   --   PLT 362  --   --   --   --   --   APTT  --   --  23*  --   --   --   LABPROT  --   --  14.0  --   --   --   INR  --   --  1.1  --   --   --   HEPARINUNFRC  --   --   --   --   --  <0.10*  CREATININE  --  0.31*  --   --   --   --   TROPONINIHS 739* 1,626*  --  2,675* 2,144*  --     Estimated Creatinine Clearance: 38 mL/min (A) (by C-G formula based on SCr of 0.31 mg/dL (L)).   Medications:  Infusions:   heparin 550 Units/hr (07/05/21 0331)    Assessment: 60 yoF with past medical history including CAD, hypertension, high cholesterol presents to the ED with mid and lower back pain as well as chest discomfort.  She arrives by EMS who report 2 falls today.  Pharmacy consulted to dose heparin for ACS/STEMI (48 hrs per cardiology).  No prior AC noted.  Heparin level (drawn 1.5 hours early) was subtherapeutic, undetectable at < 0.1 - No IV problems, no interruptions reported by RN CBC:  Hgb remains low/stable at 9.8, Plt WNL  No bleeding or complications reported.    Goal of Therapy:  Heparin level 0.3-0.7 units/ml Monitor platelets by anticoagulation protocol: Yes   Plan:  Give heparin 1000 units bolus IV x 1 Increase to heparin IV infusion at 700 units/hr Heparin level 8 hours after rate change Daily heparin  level and CBC    97 PharmD, BCPS Clinical Pharmacist [facility] main pharmacy [phone] 07/05/2021 11:32 AM

## 2021-07-05 NOTE — ED Notes (Signed)
[facility] at bedside 

## 2021-07-05 NOTE — ED Notes (Signed)
Ronaldo Miyamoto, MD paged and notified via phone of critical troponin of 2,675. No new orders at this time.

## 2021-07-06 DIAGNOSIS — M546 Pain in thoracic spine: Secondary | ICD-10-CM

## 2021-07-06 DIAGNOSIS — S22050A Wedge compression fracture of T5-T6 vertebra, initial encounter for closed fracture: Secondary | ICD-10-CM | POA: Diagnosis not present

## 2021-07-06 DIAGNOSIS — I248 Other forms of acute ischemic heart disease: Secondary | ICD-10-CM | POA: Diagnosis not present

## 2021-07-06 DIAGNOSIS — I2489 Other forms of acute ischemic heart disease: Secondary | ICD-10-CM

## 2021-07-06 DIAGNOSIS — R7989 Other specified abnormal findings of blood chemistry: Secondary | ICD-10-CM

## 2021-07-06 DIAGNOSIS — R778 Other specified abnormalities of plasma proteins: Secondary | ICD-10-CM | POA: Diagnosis not present

## 2021-07-06 DIAGNOSIS — R072 Precordial pain: Secondary | ICD-10-CM | POA: Diagnosis not present

## 2021-07-06 LAB — CBC
HCT: 27.4 % — ABNORMAL LOW (ref 36.0–46.0)
HCT: 27.6 % — ABNORMAL LOW (ref 36.0–46.0)
Hemoglobin: 9.1 g/dL — ABNORMAL LOW (ref 12.0–15.0)
Hemoglobin: 9.5 g/dL — ABNORMAL LOW (ref 12.0–15.0)
MCH: 33.8 pg (ref 26.0–34.0)
MCH: 35.2 pg — ABNORMAL HIGH (ref 26.0–34.0)
MCHC: 33.2 g/dL (ref 30.0–36.0)
MCHC: 34.4 g/dL (ref 30.0–36.0)
MCV: 101.9 fL — ABNORMAL HIGH (ref 80.0–100.0)
MCV: 102.2 fL — ABNORMAL HIGH (ref 80.0–100.0)
Platelets: 328 10*3/uL (ref 150–400)
Platelets: 313 10*3/uL (ref 150–400)
RBC: 2.69 MIL/uL — ABNORMAL LOW (ref 3.87–5.11)
RBC: 2.7 MIL/uL — ABNORMAL LOW (ref 3.87–5.11)
RDW: 13.3 % (ref 11.5–15.5)
RDW: 13.3 % (ref 11.5–15.5)
WBC: 7.1 10*3/uL (ref 4.0–10.5)
WBC: 6.1 10*3/uL (ref 4.0–10.5)
nRBC: 0 % (ref 0.0–0.2)
nRBC: 0 % (ref 0.0–0.2)

## 2021-07-06 LAB — CREATININE, SERUM
Creatinine, Ser: 1.13 mg/dL — ABNORMAL HIGH (ref 0.44–1.00)
GFR, Estimated: 48 mL/min — ABNORMAL LOW (ref >60)

## 2021-07-06 LAB — FERRITIN: Ferritin: 67 ng/mL (ref 11–307)

## 2021-07-06 LAB — LIPID PANEL
Cholesterol: 181 mg/dL (ref 0–200)
HDL: 56 mg/dL (ref >40)
LDL Cholesterol: 116 mg/dL — ABNORMAL HIGH (ref 0–99)
Total CHOL/HDL Ratio: 3.2 RATIO
Triglycerides: 47 mg/dL (ref <150)
VLDL: 9 mg/dL (ref 0–40)

## 2021-07-06 LAB — IRON AND TIBC
Iron: 47 ug/dL (ref 28–170)
Saturation Ratios: 13 % (ref 10.4–31.8)
TIBC: 351 ug/dL (ref 250–450)
UIBC: 304 ug/dL

## 2021-07-06 LAB — HEPARIN LEVEL (UNFRACTIONATED): Heparin Unfractionated: 0.34 IU/mL (ref 0.30–0.70)

## 2021-07-06 MED ORDER — HEPARIN SODIUM (PORCINE) 5000 UNIT/ML IJ SOLN
5000.0000 [IU] | Freq: Three times a day (TID) | INTRAMUSCULAR | Status: DC
  Administered 2021-07-06 – 2021-07-10 (×12): 5000 [IU] via SUBCUTANEOUS
  Filled 2021-07-06 (×14): qty 1

## 2021-07-06 NOTE — Progress Notes (Signed)
°  Transition of Care (TOC) Screening Note   Patient Details  Name: Natasha Harper Date of Birth: [birth year]   Transition of Care [facility]) CM/SW Contact:    Windle Guard, LCSW Phone Number: 07/06/2021, 9:44 AM    Transition of Care Department [facility]) has reviewed patient and no TOC needs have been identified at this time. We will continue to monitor patient advancement through interdisciplinary progression rounds. If new patient transition needs arise, please place a TOC consult.

## 2021-07-06 NOTE — Progress Notes (Signed)
DAILY PROGRESS NOTE   Patient Name: Natasha Harper Date of Encounter: 07/06/2021 Cardiologist: Dorris Carnes, MD  Chief Complaint   No chest pain  Patient Profile   Natasha Harper is a 86 y.o. female with a hx of CAD s/p remote PCI to RCA and diagonal, carotid plaque, hypothyroidism, hyperlipidemia and recurrent fall who is being seen 07/05/2021 for the evaluation of non-STEMI at the request of Dr. Velia Meyer.   History of CAD s/p PCI to RCA and diagonal in 1993.  Has residual 70% LAD lesion.  No ischemia on Myoview in 2007 & 2013.  Carotid doppler January 2022 showed stable 1 to 39% right ICA stenosis and mild plaquing of left ICA.  Subjective   No chest pain. Echo yesterday shows LVEF 55-60% with normal regional WM, despite NSTEMI. Medical therapy has been recommended  d/t her frailty. Small posterior pericardial effusion was noted.  Troponin has remained remarkably flat (2675, 2144, 2434, 2444).  LDL 116.  Objective   Vitals:   07/05/21 2314 07/06/21 0300 07/06/21 0740 07/06/21 1141  BP: (!) 165/65 (!) 169/61 (!) 151/69 (!) 152/63  Pulse: 69 69 73 (!) 58  Resp: 20 16 19 14   Temp: 99.6 F (37.6 C) 98.4 F (36.9 C) 98.2 F (36.8 C) 98.8 F (37.1 C)  TempSrc: Oral Oral Oral Axillary  SpO2: 95% 93% 91% 99%  Weight:      Height:        Intake/Output Summary (Last 24 hours) at 07/06/2021 1253 Last data filed at 07/06/2021 0400 Gross per 24 hour  Intake 195.18 ml  Output --  Net 195.18 ml   Filed Weights   07/05/21 1010  Weight: 46 kg    Physical Exam   General appearance: alert, cachectic, and no distress Neck: no carotid bruit, no JVD, and thyroid not enlarged, symmetric, no tenderness/mass/nodules Lungs: clear to auscultation bilaterally Heart: regular rate and rhythm Abdomen: soft, non-tender; bowel sounds normal; no masses,  no organomegaly Extremities: extremities normal, atraumatic, no cyanosis or edema Pulses: 2+ and symmetric Skin: Skin color, texture,  turgor normal. No rashes or lesions Neurologic: Grossly normal Psych: Pleasant  Inpatient Medications    Scheduled Meds:  aspirin EC  81 mg Oral Daily   atorvastatin  80 mg Oral Daily   losartan  50 mg Oral Daily   metoprolol tartrate  12.5 mg Oral BID    Continuous Infusions:  heparin 750 Units/hr (07/05/21 2043)    PRN Meds: acetaminophen **OR** acetaminophen, fentaNYL (SUBLIMAZE) injection, metoprolol tartrate, naLOXone (NARCAN)  injection, ondansetron (ZOFRAN) IV   Labs   Results for orders placed or performed during the hospital encounter of 07/04/21 (from the past 48 hour(s))  CBC with Differential     Status: Abnormal   Collection Time: 07/05/21  1:02 AM  Result Value Ref Range   WBC 8.1 4.0 - 10.5 K/uL   RBC 2.83 (L) 3.87 - 5.11 MIL/uL   Hemoglobin 9.8 (L) 12.0 - 15.0 g/dL   HCT 29.4 (L) 36.0 - 46.0 %   MCV 103.9 (H) 80.0 - 100.0 fL   MCH 34.6 (H) 26.0 - 34.0 pg   MCHC 33.3 30.0 - 36.0 g/dL   RDW 13.3 11.5 - 15.5 %   Platelets 362 150 - 400 K/uL   nRBC 0.0 0.0 - 0.2 %   Neutrophils Relative % 89 %   Neutro Abs 7.1 1.7 - 7.7 K/uL   Lymphocytes Relative 6 %   Lymphs Abs 0.5 (L) 0.7 -  4.0 K/uL   Monocytes Relative 4 %   Monocytes Absolute 0.4 0.1 - 1.0 K/uL   Eosinophils Relative 0 %   Eosinophils Absolute 0.0 0.0 - 0.5 K/uL   Basophils Relative 0 %   Basophils Absolute 0.0 0.0 - 0.1 K/uL   Immature Granulocytes 1 %   Abs Immature Granulocytes 0.09 (H) 0.00 - 0.07 K/uL    Comment: Performed at [facility], [address] [place], [place] [address]  Troponin I (High Sensitivity)     Status: Abnormal   Collection Time: 07/05/21  1:02 AM  Result Value Ref Range   Troponin I (High Sensitivity) 739 (HH) <18 ng/L    Comment: CRITICAL RESULT CALLED TO, READ BACK BY AND VERIFIED WITH:  Army Chaco RN 07/05/21 @ 0226 VS (NOTE) Elevated high sensitivity troponin I (hsTnI) values and significant  changes across serial measurements may  suggest ACS but many other  chronic and acute conditions are known to elevate hsTnI results.  Refer to the Links section for chest pain algorithms and additional  guidance. Performed at [facility], [address], [place], [place] [address]   Urinalysis, Routine w reflex microscopic Urine, Clean Catch     Status: Abnormal   Collection Time: 07/05/21  1:02 AM  Result Value Ref Range   Color, Urine YELLOW YELLOW   APPearance HAZY (A) CLEAR   Specific Gravity, Urine >1.046 (H) 1.005 - 1.030   pH 7.0 5.0 - 8.0   Glucose, UA NEGATIVE NEGATIVE mg/dL   Hgb urine dipstick NEGATIVE NEGATIVE   Bilirubin Urine NEGATIVE NEGATIVE   Ketones, ur NEGATIVE NEGATIVE mg/dL   Protein, ur 30 (A) NEGATIVE mg/dL   Nitrite NEGATIVE NEGATIVE   Leukocytes,Ua NEGATIVE NEGATIVE   RBC / HPF 0-5 0 - 5 RBC/hpf   Bacteria, UA NONE SEEN NONE SEEN   Squamous Epithelial / LPF 0-5 0 - 5   Amorphous Crystal PRESENT     Comment: Performed at [facility], [address], [place], [place] [address]  Troponin I (High Sensitivity)     Status: Abnormal   Collection Time: 07/05/21  2:44 AM  Result Value Ref Range   Troponin I (High Sensitivity) 1,626 (HH) <18 ng/L    Comment: CRITICAL VALUE NOTED.  VALUE IS CONSISTENT WITH PREVIOUSLY REPORTED AND CALLED VALUE. (NOTE) Elevated high sensitivity troponin I (hsTnI) values and significant  changes across serial measurements may suggest ACS but many other  chronic and acute conditions are known to elevate hsTnI results.  Refer to the Links section for chest pain algorithms and additional  guidance. Performed at [facility], [address] [place], [place] [address]   Comprehensive metabolic panel     Status: Abnormal   Collection Time: 07/05/21  2:44 AM  Result Value Ref Range   Sodium 131 (L) 135 - 145 mmol/L   Potassium 4.4 3.5 - 5.1 mmol/L   Chloride 100 98 - 111 mmol/L   CO2 23 22 - 32 mmol/L   Glucose,  Bld 117 (H) 70 - 99 mg/dL    Comment: Glucose reference range applies only to samples taken after fasting for at least 8 hours.   BUN 12 8 - 23 mg/dL   Creatinine, Ser 4.23 (L) 0.44 - 1.00 mg/dL   Calcium 8.8 (L) 8.9 - 10.3 mg/dL   Total Protein 6.9 6.5 - 8.1 g/dL   Albumin 3.2 (L) 3.5 - 5.0 g/dL   AST 67 (H) 15 -  41 U/L   ALT 52 (H) 0 - 44 U/L   Alkaline Phosphatase 606 (H) 38 - 126 U/L   Total Bilirubin 0.9 0.3 - 1.2 mg/dL   GFR, Estimated >60 >60 mL/min    Comment: (NOTE) Calculated using the CKD-EPI Creatinine Equation (2021)    Anion gap 8 5 - 15    Comment: Performed at [facility], [place] [address] [place], [place] [address]  Resp Panel by RT-PCR (Flu A&B, Covid) Nasopharyngeal Swab     Status: None   Collection Time: 07/05/21  3:02 AM   Specimen: Nasopharyngeal Swab; Nasopharyngeal(NP) swabs in vial transport medium  Result Value Ref Range   SARS Coronavirus 2 by RT PCR NEGATIVE NEGATIVE    Comment: (NOTE) SARS-CoV-2 target nucleic acids are NOT DETECTED.  The SARS-CoV-2 RNA is generally detectable in upper respiratory specimens during the acute phase of infection. The lowest concentration of SARS-CoV-2 viral copies this assay can detect is 138 copies/mL. A negative result does not preclude SARS-Cov-2 infection and should not be used as the sole basis for treatment or other patient management decisions. A negative result may occur with  improper specimen collection/handling, submission of specimen other than nasopharyngeal swab, presence of viral mutation(s) within the areas targeted by this assay, and inadequate number of viral copies(<138 copies/mL). A negative result must be combined with clinical observations, patient history, and epidemiological information. The expected result is Negative.  Fact Sheet for Patients:  EntrepreneurPulse.com.au  Fact Sheet for Healthcare Providers:   IncredibleEmployment.be  This test is no t yet approved or cleared by the [place] FDA and  has been authorized for detection and/or diagnosis of SARS-CoV-2 by FDA under an Emergency Use Authorization (EUA). This EUA will remain  in effect (meaning this test can be used) for the duration of the COVID-19 declaration under Section 564(b)(1) of the Act, 21 U.S.C.section 360bbb-3(b)(1), unless the authorization is terminated  or revoked sooner.       Influenza A by PCR NEGATIVE NEGATIVE   Influenza B by PCR NEGATIVE NEGATIVE    Comment: (NOTE) The Xpert Xpress SARS-CoV-2/FLU/RSV plus assay is intended as an aid in the diagnosis of influenza from Nasopharyngeal swab specimens and should not be used as a sole basis for treatment. Nasal washings and aspirates are unacceptable for Xpert Xpress SARS-CoV-2/FLU/RSV testing.  Fact Sheet for Patients: EntrepreneurPulse.com.au  Fact Sheet for Healthcare Providers: IncredibleEmployment.be  This test is not yet approved or cleared by the [place] FDA and has been authorized for detection and/or diagnosis of SARS-CoV-2 by FDA under an Emergency Use Authorization (EUA). This EUA will remain in effect (meaning this test can be used) for the duration of the COVID-19 declaration under Section 564(b)(1) of the Act, 21 U.S.C. section 360bbb-3(b)(1), unless the authorization is terminated or revoked.  Performed at [facility], [place] [address] [place], [place] [address]   APTT     Status: Abnormal   Collection Time: 07/05/21  3:15 AM  Result Value Ref Range   aPTT 23 (L) 24 - 36 seconds    Comment: Performed at [facility], [place] [address] [place], [place] [address]  Protime-INR     Status: None   Collection Time: 07/05/21  3:15 AM  Result Value Ref Range   Prothrombin Time 14.0 11.4 - 15.2 seconds   INR 1.1 0.8 - 1.2    Comment:  (NOTE) INR goal varies based on device and disease states. Performed at [facility], [place] [address] [place], [place] [address]   Troponin I (High Sensitivity)     Status: Abnormal   Collection Time: 07/05/21  6:45 AM  Result Value Ref Range   Troponin I (High Sensitivity) 2,675 (HH) <18 ng/L    Comment: CRITICAL RESULT CALLED TO, READ BACK BY AND VERIFIED WITH: KELSEY, A. RN ON 07/05/2021 @ 0809 BY MECIAL J. (NOTE) Elevated high sensitivity troponin I (hsTnI) values and significant  changes across serial measurements may suggest ACS but many other  chronic and acute conditions are known to elevate hsTnI results.  Refer to the Links section for chest pain algorithms and additional  guidance. Performed at [facility], [place] [address], [place], [place] [address]   Troponin I (High Sensitivity)     Status: Abnormal   Collection Time: 07/05/21  8:35 AM  Result Value Ref Range   Troponin I (High Sensitivity) 2,144 (HH) <18 ng/L    Comment: CRITICAL VALUE NOTED.  VALUE IS CONSISTENT WITH PREVIOUSLY REPORTED AND CALLED VALUE. (NOTE) Elevated high sensitivity troponin I (hsTnI) values and significant  changes across serial measurements may suggest ACS but many other  chronic and acute conditions are known to elevate hsTnI results.  Refer to the Links section for chest pain algorithms and additional  guidance. Performed at [facility], [place] [address] [place], [place] [address]   Heparin level (unfractionated)     Status: Abnormal   Collection Time: 07/05/21 10:18 AM  Result Value Ref Range   Heparin Unfractionated <0.10 (L) 0.30 - 0.70 IU/mL    Comment: (NOTE) The clinical reportable range upper limit is being lowered to >1.10 to align with the FDA approved guidance for the current laboratory assay.  If heparin results are below expected values, and patient dosage has  been confirmed, suggest follow up testing of  antithrombin III levels. Performed at [facility], [place] [address], [place], [place] [address]   Troponin I (High Sensitivity)     Status: Abnormal   Collection Time: 07/05/21 10:18 AM  Result Value Ref Range   Troponin I (High Sensitivity) 2,434 (HH) <18 ng/L    Comment: RBV SAUVOIE, B. RN ON 07/05/2021 @ [id number] BY MECIAL J. (NOTE) Elevated high sensitivity troponin I (hsTnI) values and significant  changes across serial measurements may suggest ACS but many other  chronic and acute conditions are known to elevate hsTnI results.  Refer to the Links section for chest pain algorithms and additional  guidance. Performed at [facility], [place] [address], [place], [place] [address]   Gamma GT     Status: Abnormal   Collection Time: 07/05/21 10:18 AM  Result Value Ref Range   GGT 708 (H) 7 - 50 U/L    Comment: Performed at [facility], [place] [address], [place], [place] [address]  Folate, serum, performed at [facility] lab     Status: None   Collection Time: 07/05/21 10:18 AM  Result Value Ref Range   Folate 34.8 >5.9 ng/mL    Comment: RESULTS CONFIRMED BY MANUAL DILUTION Performed at [place] [address], [place], [place] [address]   Vitamin B12     Status: None   Collection Time: 07/05/21 10:18 AM  Result Value Ref Range   Vitamin B-12 474 180 - 914 pg/mL    Comment: (NOTE) This assay is not validated for testing neonatal or myeloproliferative syndrome specimens for Vitamin B12 levels. Performed at  [facility], [place] [address], [place], [place] [address]   CK     Status: Abnormal   Collection Time: 07/05/21 10:18 AM  Result Value Ref Range   Total CK 283 (H) 38 - 234 U/L    Comment: Performed at [facility], [place] [address], [place], [place] [address]  Troponin I (High Sensitivity)     Status: Abnormal   Collection Time: 07/05/21  1:43 PM  Result Value  Ref Range   Troponin I (High Sensitivity) 2,444 (HH) <18 ng/L    Comment: CRITICAL VALUE NOTED.  VALUE IS CONSISTENT WITH PREVIOUSLY REPORTED AND CALLED VALUE. (NOTE) Elevated high sensitivity troponin I (hsTnI) values and significant  changes across serial measurements may suggest ACS but many other  chronic and acute conditions are known to elevate hsTnI results.  Refer to the Links section for chest pain algorithms and additional  guidance. Performed at [facility], [place] [address], [place], [place] [address]   MRSA Next Gen by PCR, Nasal     Status: None   Collection Time: 07/05/21  4:45 PM   Specimen: Nasal Mucosa; Nasal Swab  Result Value Ref Range   MRSA by PCR Next Gen NOT DETECTED NOT DETECTED    Comment: (NOTE) The GeneXpert MRSA Assay (FDA approved for NASAL specimens only), is one component of a comprehensive MRSA colonization surveillance program. It is not intended to diagnose MRSA infection nor to guide or monitor treatment for MRSA infections. Test performance is not FDA approved in patients less than 51 years old. Performed at [facility], [place] [address] [place], [place] [address]   Heparin level (unfractionated)     Status: None   Collection Time: 07/05/21  8:00 PM  Result Value Ref Range   Heparin Unfractionated 0.32 0.30 - 0.70 IU/mL    Comment: (NOTE) The clinical reportable range upper limit is being lowered to >1.10 to align with the FDA approved guidance for the current laboratory assay.  If heparin results are below expected values, and patient dosage has  been confirmed, suggest follow up testing of antithrombin III levels. Performed at [facility], [place] [address] [place], [place] [address]   CBC     Status: Abnormal   Collection Time: 07/06/21  3:08 AM  Result Value Ref Range   WBC 7.1 4.0 - 10.5 K/uL   RBC 2.69 (L) 3.87 - 5.11 MIL/uL   Hemoglobin 9.1 (L) 12.0 - 15.0 g/dL   HCT 27.4 (L) 36.0 - 46.0 %   MCV  101.9 (H) 80.0 - 100.0 fL   MCH 33.8 26.0 - 34.0 pg   MCHC 33.2 30.0 - 36.0 g/dL   RDW 13.3 11.5 - 15.5 %   Platelets 328 150 - 400 K/uL   nRBC 0.0 0.0 - 0.2 %    Comment: Performed at [facility], [place] [address] [place], [place] [address]  Lipid panel     Status: Abnormal   Collection Time: 07/06/21  3:08 AM  Result Value Ref Range   Cholesterol 181 0 - 200 mg/dL   Triglycerides 47 <150 mg/dL   HDL 56 >40 mg/dL   Total CHOL/HDL Ratio 3.2 RATIO   VLDL 9 0 - 40 mg/dL   LDL Cholesterol 116 (H) 0 - 99 mg/dL    Comment:        Total Cholesterol/HDL:CHD Risk Coronary Heart Disease Risk Table  Men   Women  1/2 Average Risk   3.4   3.3  Average Risk       5.0   4.4  2 X Average Risk   9.6   7.1  3 X Average Risk  23.4   11.0        Use the calculated Patient Ratio above and the CHD Risk Table to determine the patient's CHD Risk.        ATP III CLASSIFICATION (LDL):  <100     mg/dL   Optimal  100-129  mg/dL   Near or Above                    Optimal  130-159  mg/dL   Borderline  160-189  mg/dL   High  >190     mg/dL   Very High Performed at [place] [address], [place], [place] [address]   Heparin level (unfractionated)     Status: None   Collection Time: 07/06/21  3:08 AM  Result Value Ref Range   Heparin Unfractionated 0.34 0.30 - 0.70 IU/mL    Comment: (NOTE) The clinical reportable range upper limit is being lowered to >1.10 to align with the FDA approved guidance for the current laboratory assay.  If heparin results are below expected values, and patient dosage has  been confirmed, suggest follow up testing of antithrombin III levels. Performed at [facility], [place] [address] [place] [address], [place], [place] [address]   Iron and TIBC     Status: None   Collection Time: 07/06/21 10:08 AM  Result Value Ref Range   Iron 47 28 - 170 ug/dL   TIBC 351 250 - 450 ug/dL   Saturation Ratios 13 10.4 - 31.8 %   UIBC 304 ug/dL    Comment:  Performed at [facility], [place] [address], [place], [place] [address]  Ferritin     Status: None   Collection Time: 07/06/21 10:08 AM  Result Value Ref Range   Ferritin 67 11 - 307 ng/mL    Comment: Performed at [facility], [place] [address] [place], [place] [address]    ECG   N/A  Telemetry   Sinus rhythm with PAC's - Personally Reviewed  Radiology    DG Chest 2 View  Result Date: 07/05/2021 CLINICAL DATA:  Chest pain.  Fall. EXAM: CHEST - 2 VIEW COMPARISON:  None. FINDINGS: Left lung base density, likely atelectasis or infiltrate. Trace left pleural effusion may be present. No pneumothorax. There is cardiomegaly with vascular congestion. Atherosclerotic calcification of the aorta. Osteopenia with degenerative changes of the spine. Multilevel age indeterminate compression fractures. IMPRESSION: 1. Left lung base atelectasis versus infiltrate. 2. Cardiomegaly with vascular congestion. 3. Multilevel age indeterminate compression fractures. Electronically Signed   By: Anner Crete M.D.   On: 07/05/2021 00:40   DG Thoracic Spine 2 View  Result Date: 07/05/2021 CLINICAL DATA:  Low back pain after 2 falls today. Limited movement. EXAM: THORACIC SPINE 2 VIEWS COMPARISON:  CT thoracic spine 04/13/2021 FINDINGS: Normal alignment of the thoracic spine. Diffuse bone demineralization. Multiple vertebral compression fractures demonstrated at T12, T11, T10, T9, T8, T7, and T6 levels. Mild endplate compression at T5 and T4. Similar appearance to previous study. No definite new fractures. No abnormal paraspinal soft tissue swelling. Due to bone demineralization, CT would be more sensitive for evaluation of possible progression if clinically indicated. IMPRESSION: Diffuse bone demineralization with multiple thoracic vertebral compression  fractures, similar to prior study. Changes likely represent osteoporosis. Electronically Signed   By: Lucienne Capers M.D.   On: 07/05/2021 00:00   DG  Lumbar Spine Complete  Result Date: 07/04/2021 CLINICAL DATA:  Back pain.  Two falls today. EXAM: LUMBAR SPINE - COMPLETE 4+ VIEW COMPARISON:  CT 04/13/2021 FINDINGS: Diffuse bone demineralization. Five lumbar type vertebral bodies. Compression fractures again demonstrated at L1 without change. Mild superior endplate compression demonstrated at L2, L4, and L5, similar to prior study. Alignment is unchanged. Overlying bowel gas limits visualization of the sacrum. Prominent vascular calcifications. IMPRESSION: Diffuse bone demineralization with multiple compression fractures, similar to prior study. Changes likely due to osteoporosis. No definite acute fractures. Electronically Signed   By: Lucienne Capers M.D.   On: 07/04/2021 23:58   CT Head Wo Contrast  Result Date: 07/05/2021 CLINICAL DATA:  Multiple falls. EXAM: CT HEAD WITHOUT CONTRAST TECHNIQUE: Contiguous axial images were obtained from the base of the skull through the vertex without intravenous contrast. COMPARISON:  None. FINDINGS: Brain: There is moderate severity cerebral atrophy with widening of the extra-axial spaces and ventricular dilatation. There are areas of decreased attenuation within the white matter tracts of the supratentorial brain, consistent with microvascular disease changes. Vascular: No hyperdense vessel or unexpected calcification. Skull: Negative for acute fracture. A 9 mm x 4 mm benign-appearing area of focal cortical thickening is seen within left frontal region. Sinuses/Orbits: No acute finding. Other: None. IMPRESSION: 1. Generalized cerebral atrophy and microvascular disease changes of the supratentorial brain. 2. No acute intracranial abnormality. Electronically Signed   By: Virgina Norfolk M.D.   On: 07/05/2021 02:30   CT Angio Chest PE W and/or Wo Contrast  Result Date: 07/05/2021 CLINICAL DATA:  86 year old female with clinical high suspicion of pulmonary embolism. EXAM: CT ANGIOGRAPHY CHEST WITH CONTRAST  TECHNIQUE: Multidetector CT imaging of the chest was performed using the standard protocol during bolus administration of intravenous contrast. Multiplanar CT image reconstructions and MIPs were obtained to evaluate the vascular anatomy. CONTRAST:  36mL OMNIPAQUE IOHEXOL 350 MG/ML SOLN COMPARISON:  No priors. FINDINGS: Cardiovascular: No filling defects are noted in the pulmonary arterial tree to suggest pulmonary embolism. Heart size is normal. There is no significant pericardial fluid, thickening or pericardial calcification. There is aortic atherosclerosis, as well as atherosclerosis of the great vessels of the mediastinum and the coronary arteries, including calcified atherosclerotic plaque in the left main, left anterior descending and right coronary arteries. Mediastinum/Nodes: No pathologically enlarged mediastinal or hilar lymph nodes. Hilar esophagus is unremarkable in appearance. No axillary lymphadenopathy. Lungs/Pleura: No acute consolidative airspace disease. No pleural effusions. Dependent areas of subsegmental atelectasis lying in the lower lobes of the lungs bilaterally. No definite suspicious appearing pulmonary nodules or masses are noted. Upper Abdomen: Aortic atherosclerosis. Musculoskeletal: In the right breast (axial image 88 of series 4) there is a 2.0 x 1.9 cm high attenuation (83 HU) lesion. Multiple chronic appearing compression fractures are noted in the thoracic spine involving the superior endplate of T1, T5, superior endplate of T6, T7, T8, T9, T10 and T11, most severe at T11 where there is 70% loss of anterior vertebral body height. There are no aggressive appearing lytic or blastic lesions noted in the visualized portions of the skeleton. Review of the MIP images confirms the above findings. IMPRESSION: 1. No evidence of pulmonary embolism. 2. Widespread dependent atelectasis in the lower lobes of the lungs bilaterally. 3. Aortic atherosclerosis, in addition to left main and 2 vessel  coronary artery  disease. 4. 2.0 x 1.9 cm high attenuation lesion in the right breast. This is nonspecific, but warrants further evaluation with follow-up mammography and/or breast ultrasound in the near future to exclude neoplasm. Aortic Atherosclerosis (ICD10-I70.0). Electronically Signed   By: Vinnie Langton M.D.   On: 07/05/2021 07:01   CT Cervical Spine Wo Contrast  Result Date: 07/05/2021 CLINICAL DATA:  Trauma EXAM: CT CERVICAL, THORACIC, AND LUMBAR SPINE WITHOUT CONTRAST TECHNIQUE: Multidetector CT imaging of the cervical, thoracic and lumbar spine was performed without intravenous contrast. Multiplanar CT image reconstructions were also generated. COMPARISON:  04/13/2021 FINDINGS: CT CERVICAL SPINE FINDINGS Alignment: Normal. Skull base and vertebrae: No acute fracture. No primary bone lesion or focal pathologic process. Soft tissues and spinal canal: No prevertebral fluid or swelling. No visible canal hematoma. Disc levels:  No spinal canal stenosis. CT THORACIC SPINE FINDINGS Alignment: Normal. Vertebrae: There is a wedge compression fracture of T1 with less than 20% loss, involving the anterior wall and superior endplate. No retropulsion. There is also a fracture at T6 with approximately 25% height loss that is new since the prior study. The fracture involves the anterior and posterior walls and the superior endplate. Lower thoracic compression deformities are unchanged. Paraspinal and other soft tissues: Calcific aortic atherosclerosis Disc levels: No spinal canal stenosis CT LUMBAR SPINE FINDINGS Segmentation: 5 lumbar type vertebrae. Alignment: Grade 1 anterolisthesis at L4-5 Vertebrae: Chronic compression deformity of L1. Paraspinal and other soft tissues: Calcific aortic atherosclerosis. Disc levels: No spinal canal stenosis. IMPRESSION: 1. T1 and T6 compression fractures with 10-20% height loss, new since the prior study. No retropulsion or spinal canal stenosis. 2. Multiple other chronic  compression deformities of the thoracic and lumbar spine. 3. No acute fracture or static subluxation of the cervical spine. Aortic Atherosclerosis (ICD10-I70.0). Electronically Signed   By: Ulyses Jarred M.D.   On: 07/05/2021 01:16   CT Thoracic Spine Wo Contrast  Result Date: 07/05/2021 CLINICAL DATA:  Trauma EXAM: CT CERVICAL, THORACIC, AND LUMBAR SPINE WITHOUT CONTRAST TECHNIQUE: Multidetector CT imaging of the cervical, thoracic and lumbar spine was performed without intravenous contrast. Multiplanar CT image reconstructions were also generated. COMPARISON:  04/13/2021 FINDINGS: CT CERVICAL SPINE FINDINGS Alignment: Normal. Skull base and vertebrae: No acute fracture. No primary bone lesion or focal pathologic process. Soft tissues and spinal canal: No prevertebral fluid or swelling. No visible canal hematoma. Disc levels:  No spinal canal stenosis. CT THORACIC SPINE FINDINGS Alignment: Normal. Vertebrae: There is a wedge compression fracture of T1 with less than 20% loss, involving the anterior wall and superior endplate. No retropulsion. There is also a fracture at T6 with approximately 25% height loss that is new since the prior study. The fracture involves the anterior and posterior walls and the superior endplate. Lower thoracic compression deformities are unchanged. Paraspinal and other soft tissues: Calcific aortic atherosclerosis Disc levels: No spinal canal stenosis CT LUMBAR SPINE FINDINGS Segmentation: 5 lumbar type vertebrae. Alignment: Grade 1 anterolisthesis at L4-5 Vertebrae: Chronic compression deformity of L1. Paraspinal and other soft tissues: Calcific aortic atherosclerosis. Disc levels: No spinal canal stenosis. IMPRESSION: 1. T1 and T6 compression fractures with 10-20% height loss, new since the prior study. No retropulsion or spinal canal stenosis. 2. Multiple other chronic compression deformities of the thoracic and lumbar spine. 3. No acute fracture or static subluxation of the  cervical spine. Aortic Atherosclerosis (ICD10-I70.0). Electronically Signed   By: Ulyses Jarred M.D.   On: 07/05/2021 01:16   CT Lumbar Spine Wo Contrast  Result Date: 07/05/2021 CLINICAL DATA:  Trauma EXAM: CT CERVICAL, THORACIC, AND LUMBAR SPINE WITHOUT CONTRAST TECHNIQUE: Multidetector CT imaging of the cervical, thoracic and lumbar spine was performed without intravenous contrast. Multiplanar CT image reconstructions were also generated. COMPARISON:  04/13/2021 FINDINGS: CT CERVICAL SPINE FINDINGS Alignment: Normal. Skull base and vertebrae: No acute fracture. No primary bone lesion or focal pathologic process. Soft tissues and spinal canal: No prevertebral fluid or swelling. No visible canal hematoma. Disc levels:  No spinal canal stenosis. CT THORACIC SPINE FINDINGS Alignment: Normal. Vertebrae: There is a wedge compression fracture of T1 with less than 20% loss, involving the anterior wall and superior endplate. No retropulsion. There is also a fracture at T6 with approximately 25% height loss that is new since the prior study. The fracture involves the anterior and posterior walls and the superior endplate. Lower thoracic compression deformities are unchanged. Paraspinal and other soft tissues: Calcific aortic atherosclerosis Disc levels: No spinal canal stenosis CT LUMBAR SPINE FINDINGS Segmentation: 5 lumbar type vertebrae. Alignment: Grade 1 anterolisthesis at L4-5 Vertebrae: Chronic compression deformity of L1. Paraspinal and other soft tissues: Calcific aortic atherosclerosis. Disc levels: No spinal canal stenosis. IMPRESSION: 1. T1 and T6 compression fractures with 10-20% height loss, new since the prior study. No retropulsion or spinal canal stenosis. 2. Multiple other chronic compression deformities of the thoracic and lumbar spine. 3. No acute fracture or static subluxation of the cervical spine. Aortic Atherosclerosis (ICD10-I70.0). Electronically Signed   By: Ulyses Jarred M.D.   On:  07/05/2021 01:16   ECHOCARDIOGRAM COMPLETE  Result Date: 07/05/2021    ECHOCARDIOGRAM REPORT   Patient Name:   Natasha Harper [facility] Date of Exam: 07/05/2021 Medical Rec #:  [id number]       Height:       61.0 in Accession #:    [id number]      Weight:       101.4 lb Date of Birth:  May 20, 1935      BSA:          1.415 m Patient Age:    48 years        BP:           165/72 mmHg Patient Gender: F               HR:           68 bpm. Exam Location:  Inpatient Procedure: 2D Echo, Color Doppler and Cardiac Doppler Indications:    NSTEMI  History:        Patient has no prior history of Echocardiogram examinations.                 Risk Factors:Hypertension and Dyslipidemia.  Sonographer:    Jyl Heinz Referring Phys: [id number] [place]  1. Left ventricular ejection fraction, by estimation, is 55 to 60%. The left ventricle has normal function. The left ventricle has no regional wall motion abnormalities. There is mild concentric left ventricular hypertrophy. Left ventricular diastolic parameters are consistent with Grade I diastolic dysfunction (impaired relaxation).  2. Right ventricular systolic function is normal. The right ventricular size is normal. There is normal pulmonary artery systolic pressure. The estimated right ventricular systolic pressure is 0000000 mmHg.  3. A small pericardial effusion is present. The pericardial effusion is posterior to the left ventricle.  4. The mitral valve is grossly normal. Mild mitral valve regurgitation. No evidence of mitral stenosis.  5. The aortic valve is tricuspid. There is mild calcification of the  aortic valve. There is mild thickening of the aortic valve. Aortic valve regurgitation is not visualized. Aortic valve sclerosis is present, with no evidence of aortic valve stenosis.  6. The inferior vena cava is normal in size with greater than 50% respiratory variability, suggesting right atrial pressure of 3 mmHg. FINDINGS  Left Ventricle: Left ventricular ejection  fraction, by estimation, is 55 to 60%. The left ventricle has normal function. The left ventricle has no regional wall motion abnormalities. The left ventricular internal cavity size was normal in size. There is  mild concentric left ventricular hypertrophy. Left ventricular diastolic parameters are consistent with Grade I diastolic dysfunction (impaired relaxation). Right Ventricle: The right ventricular size is normal. No increase in right ventricular wall thickness. Right ventricular systolic function is normal. There is normal pulmonary artery systolic pressure. The tricuspid regurgitant velocity is 2.67 m/s, and  with an assumed right atrial pressure of 3 mmHg, the estimated right ventricular systolic pressure is 0000000 mmHg. Left Atrium: Left atrial size was normal in size. Right Atrium: Right atrial size was normal in size. Pericardium: A small pericardial effusion is present. The pericardial effusion is posterior to the left ventricle. Mitral Valve: The mitral valve is grossly normal. Mild mitral valve regurgitation. No evidence of mitral valve stenosis. Tricuspid Valve: The tricuspid valve is grossly normal. Tricuspid valve regurgitation is trivial. No evidence of tricuspid stenosis. Aortic Valve: The aortic valve is tricuspid. There is mild calcification of the aortic valve. There is mild thickening of the aortic valve. Aortic valve regurgitation is not visualized. Aortic valve sclerosis is present, with no evidence of aortic valve stenosis. Aortic valve peak gradient measures 5.5 mmHg. Pulmonic Valve: The pulmonic valve was grossly normal. Pulmonic valve regurgitation is trivial. No evidence of pulmonic stenosis. Aorta: The aortic root and ascending aorta are structurally normal, with no evidence of dilitation. Venous: The inferior vena cava is normal in size with greater than 50% respiratory variability, suggesting right atrial pressure of 3 mmHg. IAS/Shunts: The atrial septum is grossly normal.  LEFT  VENTRICLE PLAX 2D LVIDd:         4.00 cm     Diastology LVIDs:         2.50 cm     LV e' medial:    4.46 cm/s LV PW:         1.30 cm     LV E/e' medial:  17.4 LV IVS:        1.10 cm     LV e' lateral:   5.87 cm/s LVOT diam:     1.80 cm     LV E/e' lateral: 13.2 LV SV:         37 LV SV Index:   26 LVOT Area:     2.54 cm  LV Volumes (MOD) LV vol d, MOD A2C: 95.2 ml LV vol d, MOD A4C: 99.0 ml LV vol s, MOD A2C: 45.0 ml LV vol s, MOD A4C: 46.6 ml LV SV MOD A2C:     50.2 ml LV SV MOD A4C:     99.0 ml LV SV MOD BP:      53.4 ml RIGHT VENTRICLE             IVC RV Basal diam:  3.90 cm     IVC diam: 1.20 cm RV Mid diam:    3.70 cm RV S prime:     14.40 cm/s TAPSE (M-mode): 2.2 cm LEFT ATRIUM  Index        RIGHT ATRIUM           Index LA diam:      3.50 cm 2.47 cm/m   RA Area:     12.60 cm LA Vol (A2C): 25.6 ml 18.09 ml/m  RA Volume:   26.10 ml  18.44 ml/m LA Vol (A4C): 37.4 ml 26.43 ml/m  AORTIC VALVE AV Area (Vmax): 1.61 cm AV Vmax:        117.00 cm/s AV Peak Grad:   5.5 mmHg LVOT Vmax:      73.80 cm/s LVOT Vmean:     52.600 cm/s LVOT VTI:       0.146 m  AORTA Ao Root diam: 2.70 cm Ao Asc diam:  2.90 cm MITRAL VALVE               TRICUSPID VALVE MV Area (PHT): 4.15 cm    TR Peak grad:   28.5 mmHg MV Decel Time: 183 msec    TR Vmax:        267.00 cm/s MV E velocity: 77.60 cm/s MV A velocity: 95.50 cm/s  SHUNTS MV E/A ratio:  0.81        Systemic VTI:  0.15 m                            Systemic Diam: 1.80 cm Eleonore Chiquito MD Electronically signed by Eleonore Chiquito MD Signature Date/Time: 07/05/2021/1:54:33 PM    Final    US Abdomen Limited RUQ (LIVER/GB)  Result Date: 07/05/2021 CLINICAL DATA:  Elevated alkaline phosphatase. EXAM: ULTRASOUND ABDOMEN LIMITED RIGHT UPPER QUADRANT COMPARISON:  None. FINDINGS: Gallbladder: Gallbladder wall is UPPER limits normal, measuring 1.5 millimeters. No sonographic Murphy's sign. No stones or pericholecystic fluid. Common bile duct: Diameter: 3.0 millimeters Liver: No  focal lesion identified. Within normal limits in parenchymal echogenicity. Portal vein is patent on color Doppler imaging with normal direction of blood flow towards the liver. Other: Study quality is degraded by limited patient mobility and difficulty with breath holding. IMPRESSION: No evidence for acute abnormality of the RIGHT UPPER QUADRANT. Electronically Signed   By: Nolon Nations M.D.   On: 07/05/2021 09:23    Cardiac Studies   See echo  Assessment   Principal Problem:   Thoracic compression fracture [facility]) Demand ischemia  Plan   Elevated troponin probably related to demand ischemia - interesting that it has remained relatively flat. No chest pain. Echo yesterday shows normal LVEF without regional WMA's - small posterior pericardial effusion, but this does not seem like a myopericarditis either. Will stop IV Heparin - greater risk of spontaneous bleeding at this point. No plans for cath.  Time Spent Directly with Patient:  I have spent a total of 25 minutes with the patient reviewing hospital notes, telemetry, EKGs, labs and examining the patient as well as establishing an assessment and plan that was discussed personally with the patient.  > 50% of time was spent in direct patient care.  Length of Stay:  LOS: 1 day   Pixie Casino, MD, [facility], [place] Director of the Advanced Lipid Disorders &  Cardiovascular Risk Reduction Clinic Diplomate of the American Board of Clinical Lipidology Attending Cardiologist  Direct Dial: [phone]   Fax: [phone]  Website:  [email or web address] 07/06/2021, 12:53 PM

## 2021-07-06 NOTE — Progress Notes (Signed)
[facility]  PROGRESS NOTE  Natasha Harper NID:[id number] DOB: Dec 04, 1934 DOA: 07/04/2021 PCP: [name], MD   Brief HPI:   86 year old female with medical history of hypothyroidism, hyperlipidemia, CAD, hypertension, presented with back pain and falls.  Patient fell and was unable to get up from floor.  Patient was brought to the hospital for further evaluation.  CT of thoracic spine showed T1 and T6 compression fractures, troponin was elevated.  Cardiology was consulted.  Patient placed on heparin GTT.    Subjective   Patient seen and examined, denies any pain at this time.  States that it hurts  only when somebody touches her back.   Assessment/Plan:    NSTEMI -Patient complained of pain in sternum -Troponin elevated 739 >1626> 2675 > 2144> 2434> 2444 -Patient started on IV heparin GTT -Cardiology consulted; felt that she was not a good candidate for invasive procedures -Recommended heparin for 48 hours, continue aspirin.  Could consider Plavix 75 mg daily instead of aspirin at some point -Continue metoprolol, losartan  Compression fractures, T1, T6 -T1, T6 compression fractures with 10 to 20% height loss with no retropulsion or spinal canal stenosis noted on CT thoracic spine -Continue pain management, PT/OT -If pain not well controlled, consider IR consultation for kyphoplasty  Hypertension -Blood pressure is elevated, likely from worsening pain -Continue losartan, metoprolol  Hyperlipidemia -Continue atorvastatin  Right breast abnormality on CTA -2.0 x 1.9 cm high attenuation lesion in the right breast.  This is nonspecific but require further evaluation with mammography/breast ultrasound as outpatient to exclude neoplasm  Elevated alkaline phosphatase -Alkaline phosphatase elevated at 606 -GGT elevated 708 -AST, 67, ALT 52, total bili 0.9 -Abdominal ultrasound shows no acute abnormality -?  Likely from compression fractures as above  Macrocytic  anemia -B12 474, folate 34.8 -Unclear etiology -Check iron studies  Hyponatremia -Mild, follow BMP in am   Medications     aspirin EC  81 mg Oral Daily   atorvastatin  80 mg Oral Daily   losartan  50 mg Oral Daily   metoprolol tartrate  12.5 mg Oral BID     Data Reviewed:   CBG:  No results for input(s): GLUCAP in the last 168 hours.  SpO2: 91 % O2 Flow Rate (L/min): 2 L/min    Vitals:   07/05/21 2130 07/05/21 2314 07/06/21 0300 07/06/21 0740  BP: (!) 173/61 (!) 165/65 (!) 169/61 (!) 151/69  Pulse: 73 69 69 73  Resp:  20 16 19   Temp:  99.6 F (37.6 C) 98.4 F (36.9 C) 98.2 F (36.8 C)  TempSrc:  Oral Oral Oral  SpO2:  95% 93% 91%  Weight:      Height:         Intake/Output Summary (Last 24 hours) at 07/06/2021 0817 Last data filed at 07/06/2021 0400 Gross per 24 hour  Intake 195.18 ml  Output --  Net 195.18 ml    12/29 1901 - 12/31 0700 In: 695.2 [I.V.:195.2] Out: -   Filed Weights   07/05/21 1010  Weight: 46 kg    Data Reviewed: Basic Metabolic Panel: Recent Labs  Lab 07/05/21 0244  NA 131*  K 4.4  CL 100  CO2 23  GLUCOSE 117*  BUN 12  CREATININE 0.31*  CALCIUM 8.8*   Liver Function Tests: Recent Labs  Lab 07/05/21 0244  AST 67*  ALT 52*  ALKPHOS 606*  BILITOT 0.9  PROT 6.9  ALBUMIN 3.2*   No results for input(s): LIPASE, AMYLASE in  the last 168 hours. No results for input(s): AMMONIA in the last 168 hours. CBC: Recent Labs  Lab 07/05/21 0102 07/06/21 0308  WBC 8.1 7.1  NEUTROABS 7.1  --   HGB 9.8* 9.1*  HCT 29.4* 27.4*  MCV 103.9* 101.9*  PLT 362 328   Cardiac Enzymes: Recent Labs  Lab 07/05/21 1018  CKTOTAL 283*   BNP (last 3 results) No results for input(s): BNP in the last 8760 hours.  ProBNP (last 3 results) No results for input(s): PROBNP in the last 8760 hours.  CBG: No results for input(s): GLUCAP in the last 168 hours.     Radiology Reports  DG Chest 2 View  Result Date:  07/05/2021 CLINICAL DATA:  Chest pain.  Fall. EXAM: CHEST - 2 VIEW COMPARISON:  None. FINDINGS: Left lung base density, likely atelectasis or infiltrate. Trace left pleural effusion may be present. No pneumothorax. There is cardiomegaly with vascular congestion. Atherosclerotic calcification of the aorta. Osteopenia with degenerative changes of the spine. Multilevel age indeterminate compression fractures. IMPRESSION: 1. Left lung base atelectasis versus infiltrate. 2. Cardiomegaly with vascular congestion. 3. Multilevel age indeterminate compression fractures. Electronically Signed   By: Anner Crete M.D.   On: 07/05/2021 00:40   DG Thoracic Spine 2 View  Result Date: 07/05/2021 CLINICAL DATA:  Low back pain after 2 falls today. Limited movement. EXAM: THORACIC SPINE 2 VIEWS COMPARISON:  CT thoracic spine 04/13/2021 FINDINGS: Normal alignment of the thoracic spine. Diffuse bone demineralization. Multiple vertebral compression fractures demonstrated at T12, T11, T10, T9, T8, T7, and T6 levels. Mild endplate compression at T5 and T4. Similar appearance to previous study. No definite new fractures. No abnormal paraspinal soft tissue swelling. Due to bone demineralization, CT would be more sensitive for evaluation of possible progression if clinically indicated. IMPRESSION: Diffuse bone demineralization with multiple thoracic vertebral compression fractures, similar to prior study. Changes likely represent osteoporosis. Electronically Signed   By: Lucienne Capers M.D.   On: 07/05/2021 00:00   DG Lumbar Spine Complete  Result Date: 07/04/2021 CLINICAL DATA:  Back pain.  Two falls today. EXAM: LUMBAR SPINE - COMPLETE 4+ VIEW COMPARISON:  CT 04/13/2021 FINDINGS: Diffuse bone demineralization. Five lumbar type vertebral bodies. Compression fractures again demonstrated at L1 without change. Mild superior endplate compression demonstrated at L2, L4, and L5, similar to prior study. Alignment is unchanged.  Overlying bowel gas limits visualization of the sacrum. Prominent vascular calcifications. IMPRESSION: Diffuse bone demineralization with multiple compression fractures, similar to prior study. Changes likely due to osteoporosis. No definite acute fractures. Electronically Signed   By: Lucienne Capers M.D.   On: 07/04/2021 23:58   CT Head Wo Contrast  Result Date: 07/05/2021 CLINICAL DATA:  Multiple falls. EXAM: CT HEAD WITHOUT CONTRAST TECHNIQUE: Contiguous axial images were obtained from the base of the skull through the vertex without intravenous contrast. COMPARISON:  None. FINDINGS: Brain: There is moderate severity cerebral atrophy with widening of the extra-axial spaces and ventricular dilatation. There are areas of decreased attenuation within the white matter tracts of the supratentorial brain, consistent with microvascular disease changes. Vascular: No hyperdense vessel or unexpected calcification. Skull: Negative for acute fracture. A 9 mm x 4 mm benign-appearing area of focal cortical thickening is seen within left frontal region. Sinuses/Orbits: No acute finding. Other: None. IMPRESSION: 1. Generalized cerebral atrophy and microvascular disease changes of the supratentorial brain. 2. No acute intracranial abnormality. Electronically Signed   By: Virgina Norfolk M.D.   On: 07/05/2021 02:30   CT  Angio Chest PE W and/or Wo Contrast  Result Date: 07/05/2021 CLINICAL DATA:  86 year old female with clinical high suspicion of pulmonary embolism. EXAM: CT ANGIOGRAPHY CHEST WITH CONTRAST TECHNIQUE: Multidetector CT imaging of the chest was performed using the standard protocol during bolus administration of intravenous contrast. Multiplanar CT image reconstructions and MIPs were obtained to evaluate the vascular anatomy. CONTRAST:  3mL OMNIPAQUE IOHEXOL 350 MG/ML SOLN COMPARISON:  No priors. FINDINGS: Cardiovascular: No filling defects are noted in the pulmonary arterial tree to suggest pulmonary  embolism. Heart size is normal. There is no significant pericardial fluid, thickening or pericardial calcification. There is aortic atherosclerosis, as well as atherosclerosis of the great vessels of the mediastinum and the coronary arteries, including calcified atherosclerotic plaque in the left main, left anterior descending and right coronary arteries. Mediastinum/Nodes: No pathologically enlarged mediastinal or hilar lymph nodes. Hilar esophagus is unremarkable in appearance. No axillary lymphadenopathy. Lungs/Pleura: No acute consolidative airspace disease. No pleural effusions. Dependent areas of subsegmental atelectasis lying in the lower lobes of the lungs bilaterally. No definite suspicious appearing pulmonary nodules or masses are noted. Upper Abdomen: Aortic atherosclerosis. Musculoskeletal: In the right breast (axial image 88 of series 4) there is a 2.0 x 1.9 cm high attenuation (83 HU) lesion. Multiple chronic appearing compression fractures are noted in the thoracic spine involving the superior endplate of T1, T5, superior endplate of T6, T7, T8, T9, T10 and T11, most severe at T11 where there is 70% loss of anterior vertebral body height. There are no aggressive appearing lytic or blastic lesions noted in the visualized portions of the skeleton. Review of the MIP images confirms the above findings. IMPRESSION: 1. No evidence of pulmonary embolism. 2. Widespread dependent atelectasis in the lower lobes of the lungs bilaterally. 3. Aortic atherosclerosis, in addition to left main and 2 vessel coronary artery disease. 4. 2.0 x 1.9 cm high attenuation lesion in the right breast. This is nonspecific, but warrants further evaluation with follow-up mammography and/or breast ultrasound in the near future to exclude neoplasm. Aortic Atherosclerosis (ICD10-I70.0). Electronically Signed   By: Vinnie Langton M.D.   On: 07/05/2021 07:01   CT Cervical Spine Wo Contrast  Result Date: 07/05/2021 CLINICAL DATA:   Trauma EXAM: CT CERVICAL, THORACIC, AND LUMBAR SPINE WITHOUT CONTRAST TECHNIQUE: Multidetector CT imaging of the cervical, thoracic and lumbar spine was performed without intravenous contrast. Multiplanar CT image reconstructions were also generated. COMPARISON:  04/13/2021 FINDINGS: CT CERVICAL SPINE FINDINGS Alignment: Normal. Skull base and vertebrae: No acute fracture. No primary bone lesion or focal pathologic process. Soft tissues and spinal canal: No prevertebral fluid or swelling. No visible canal hematoma. Disc levels:  No spinal canal stenosis. CT THORACIC SPINE FINDINGS Alignment: Normal. Vertebrae: There is a wedge compression fracture of T1 with less than 20% loss, involving the anterior wall and superior endplate. No retropulsion. There is also a fracture at T6 with approximately 25% height loss that is new since the prior study. The fracture involves the anterior and posterior walls and the superior endplate. Lower thoracic compression deformities are unchanged. Paraspinal and other soft tissues: Calcific aortic atherosclerosis Disc levels: No spinal canal stenosis CT LUMBAR SPINE FINDINGS Segmentation: 5 lumbar type vertebrae. Alignment: Grade 1 anterolisthesis at L4-5 Vertebrae: Chronic compression deformity of L1. Paraspinal and other soft tissues: Calcific aortic atherosclerosis. Disc levels: No spinal canal stenosis. IMPRESSION: 1. T1 and T6 compression fractures with 10-20% height loss, new since the prior study. No retropulsion or spinal canal stenosis. 2. Multiple other  chronic compression deformities of the thoracic and lumbar spine. 3. No acute fracture or static subluxation of the cervical spine. Aortic Atherosclerosis (ICD10-I70.0). Electronically Signed   By: Ulyses Jarred M.D.   On: 07/05/2021 01:16   CT Thoracic Spine Wo Contrast  Result Date: 07/05/2021 CLINICAL DATA:  Trauma EXAM: CT CERVICAL, THORACIC, AND LUMBAR SPINE WITHOUT CONTRAST TECHNIQUE: Multidetector CT imaging of the  cervical, thoracic and lumbar spine was performed without intravenous contrast. Multiplanar CT image reconstructions were also generated. COMPARISON:  04/13/2021 FINDINGS: CT CERVICAL SPINE FINDINGS Alignment: Normal. Skull base and vertebrae: No acute fracture. No primary bone lesion or focal pathologic process. Soft tissues and spinal canal: No prevertebral fluid or swelling. No visible canal hematoma. Disc levels:  No spinal canal stenosis. CT THORACIC SPINE FINDINGS Alignment: Normal. Vertebrae: There is a wedge compression fracture of T1 with less than 20% loss, involving the anterior wall and superior endplate. No retropulsion. There is also a fracture at T6 with approximately 25% height loss that is new since the prior study. The fracture involves the anterior and posterior walls and the superior endplate. Lower thoracic compression deformities are unchanged. Paraspinal and other soft tissues: Calcific aortic atherosclerosis Disc levels: No spinal canal stenosis CT LUMBAR SPINE FINDINGS Segmentation: 5 lumbar type vertebrae. Alignment: Grade 1 anterolisthesis at L4-5 Vertebrae: Chronic compression deformity of L1. Paraspinal and other soft tissues: Calcific aortic atherosclerosis. Disc levels: No spinal canal stenosis. IMPRESSION: 1. T1 and T6 compression fractures with 10-20% height loss, new since the prior study. No retropulsion or spinal canal stenosis. 2. Multiple other chronic compression deformities of the thoracic and lumbar spine. 3. No acute fracture or static subluxation of the cervical spine. Aortic Atherosclerosis (ICD10-I70.0). Electronically Signed   By: Ulyses Jarred M.D.   On: 07/05/2021 01:16   CT Lumbar Spine Wo Contrast  Result Date: 07/05/2021 CLINICAL DATA:  Trauma EXAM: CT CERVICAL, THORACIC, AND LUMBAR SPINE WITHOUT CONTRAST TECHNIQUE: Multidetector CT imaging of the cervical, thoracic and lumbar spine was performed without intravenous contrast. Multiplanar CT image  reconstructions were also generated. COMPARISON:  04/13/2021 FINDINGS: CT CERVICAL SPINE FINDINGS Alignment: Normal. Skull base and vertebrae: No acute fracture. No primary bone lesion or focal pathologic process. Soft tissues and spinal canal: No prevertebral fluid or swelling. No visible canal hematoma. Disc levels:  No spinal canal stenosis. CT THORACIC SPINE FINDINGS Alignment: Normal. Vertebrae: There is a wedge compression fracture of T1 with less than 20% loss, involving the anterior wall and superior endplate. No retropulsion. There is also a fracture at T6 with approximately 25% height loss that is new since the prior study. The fracture involves the anterior and posterior walls and the superior endplate. Lower thoracic compression deformities are unchanged. Paraspinal and other soft tissues: Calcific aortic atherosclerosis Disc levels: No spinal canal stenosis CT LUMBAR SPINE FINDINGS Segmentation: 5 lumbar type vertebrae. Alignment: Grade 1 anterolisthesis at L4-5 Vertebrae: Chronic compression deformity of L1. Paraspinal and other soft tissues: Calcific aortic atherosclerosis. Disc levels: No spinal canal stenosis. IMPRESSION: 1. T1 and T6 compression fractures with 10-20% height loss, new since the prior study. No retropulsion or spinal canal stenosis. 2. Multiple other chronic compression deformities of the thoracic and lumbar spine. 3. No acute fracture or static subluxation of the cervical spine. Aortic Atherosclerosis (ICD10-I70.0). Electronically Signed   By: Ulyses Jarred M.D.   On: 07/05/2021 01:16   ECHOCARDIOGRAM COMPLETE  Result Date: 07/05/2021    ECHOCARDIOGRAM REPORT   Patient Name:   Natasha Harper  [facility] Date of Exam: 07/05/2021 Medical Rec #:  [id number]       Height:       61.0 in Accession #:    [id number]      Weight:       101.4 lb Date of Birth:  [birth year]      BSA:          1.415 m Patient Age:    57 years        BP:           165/72 mmHg Patient Gender: F               HR:            68 bpm. Exam Location:  Inpatient Procedure: 2D Echo, Color Doppler and Cardiac Doppler Indications:    NSTEMI  History:        Patient has no prior history of Echocardiogram examinations.                 Risk Factors:Hypertension and Dyslipidemia.  Sonographer:    Jyl Heinz Referring Phys: [id number] [place]  1. Left ventricular ejection fraction, by estimation, is 55 to 60%. The left ventricle has normal function. The left ventricle has no regional wall motion abnormalities. There is mild concentric left ventricular hypertrophy. Left ventricular diastolic parameters are consistent with Grade I diastolic dysfunction (impaired relaxation).  2. Right ventricular systolic function is normal. The right ventricular size is normal. There is normal pulmonary artery systolic pressure. The estimated right ventricular systolic pressure is 0000000 mmHg.  3. A small pericardial effusion is present. The pericardial effusion is posterior to the left ventricle.  4. The mitral valve is grossly normal. Mild mitral valve regurgitation. No evidence of mitral stenosis.  5. The aortic valve is tricuspid. There is mild calcification of the aortic valve. There is mild thickening of the aortic valve. Aortic valve regurgitation is not visualized. Aortic valve sclerosis is present, with no evidence of aortic valve stenosis.  6. The inferior vena cava is normal in size with greater than 50% respiratory variability, suggesting right atrial pressure of 3 mmHg. FINDINGS  Left Ventricle: Left ventricular ejection fraction, by estimation, is 55 to 60%. The left ventricle has normal function. The left ventricle has no regional wall motion abnormalities. The left ventricular internal cavity size was normal in size. There is  mild concentric left ventricular hypertrophy. Left ventricular diastolic parameters are consistent with Grade I diastolic dysfunction (impaired relaxation). Right Ventricle: The right ventricular size is  normal. No increase in right ventricular wall thickness. Right ventricular systolic function is normal. There is normal pulmonary artery systolic pressure. The tricuspid regurgitant velocity is 2.67 m/s, and  with an assumed right atrial pressure of 3 mmHg, the estimated right ventricular systolic pressure is 0000000 mmHg. Left Atrium: Left atrial size was normal in size. Right Atrium: Right atrial size was normal in size. Pericardium: A small pericardial effusion is present. The pericardial effusion is posterior to the left ventricle. Mitral Valve: The mitral valve is grossly normal. Mild mitral valve regurgitation. No evidence of mitral valve stenosis. Tricuspid Valve: The tricuspid valve is grossly normal. Tricuspid valve regurgitation is trivial. No evidence of tricuspid stenosis. Aortic Valve: The aortic valve is tricuspid. There is mild calcification of the aortic valve. There is mild thickening of the aortic valve. Aortic valve regurgitation is not visualized. Aortic valve sclerosis is present, with no evidence of aortic valve stenosis. Aortic valve peak gradient measures  5.5 mmHg. Pulmonic Valve: The pulmonic valve was grossly normal. Pulmonic valve regurgitation is trivial. No evidence of pulmonic stenosis. Aorta: The aortic root and ascending aorta are structurally normal, with no evidence of dilitation. Venous: The inferior vena cava is normal in size with greater than 50% respiratory variability, suggesting right atrial pressure of 3 mmHg. IAS/Shunts: The atrial septum is grossly normal.  LEFT VENTRICLE PLAX 2D LVIDd:         4.00 cm     Diastology LVIDs:         2.50 cm     LV e' medial:    4.46 cm/s LV PW:         1.30 cm     LV E/e' medial:  17.4 LV IVS:        1.10 cm     LV e' lateral:   5.87 cm/s LVOT diam:     1.80 cm     LV E/e' lateral: 13.2 LV SV:         37 LV SV Index:   26 LVOT Area:     2.54 cm  LV Volumes (MOD) LV vol d, MOD A2C: 95.2 ml LV vol d, MOD A4C: 99.0 ml LV vol s, MOD A2C: 45.0 ml  LV vol s, MOD A4C: 46.6 ml LV SV MOD A2C:     50.2 ml LV SV MOD A4C:     99.0 ml LV SV MOD BP:      53.4 ml RIGHT VENTRICLE             IVC RV Basal diam:  3.90 cm     IVC diam: 1.20 cm RV Mid diam:    3.70 cm RV S prime:     14.40 cm/s TAPSE (M-mode): 2.2 cm LEFT ATRIUM           Index        RIGHT ATRIUM           Index LA diam:      3.50 cm 2.47 cm/m   RA Area:     12.60 cm LA Vol (A2C): 25.6 ml 18.09 ml/m  RA Volume:   26.10 ml  18.44 ml/m LA Vol (A4C): 37.4 ml 26.43 ml/m  AORTIC VALVE AV Area (Vmax): 1.61 cm AV Vmax:        117.00 cm/s AV Peak Grad:   5.5 mmHg LVOT Vmax:      73.80 cm/s LVOT Vmean:     52.600 cm/s LVOT VTI:       0.146 m  AORTA Ao Root diam: 2.70 cm Ao Asc diam:  2.90 cm MITRAL VALVE               TRICUSPID VALVE MV Area (PHT): 4.15 cm    TR Peak grad:   28.5 mmHg MV Decel Time: 183 msec    TR Vmax:        267.00 cm/s MV E velocity: 77.60 cm/s MV A velocity: 95.50 cm/s  SHUNTS MV E/A ratio:  0.81        Systemic VTI:  0.15 m                            Systemic Diam: 1.80 cm Eleonore Chiquito MD Electronically signed by Eleonore Chiquito MD Signature Date/Time: 07/05/2021/1:54:33 PM    Final    US Abdomen Limited RUQ (LIVER/GB)  Result Date: 07/05/2021 CLINICAL DATA:  Elevated alkaline phosphatase. EXAM: ULTRASOUND ABDOMEN  LIMITED RIGHT UPPER QUADRANT COMPARISON:  None. FINDINGS: Gallbladder: Gallbladder wall is UPPER limits normal, measuring 1.5 millimeters. No sonographic Murphy's sign. No stones or pericholecystic fluid. Common bile duct: Diameter: 3.0 millimeters Liver: No focal lesion identified. Within normal limits in parenchymal echogenicity. Portal vein is patent on color Doppler imaging with normal direction of blood flow towards the liver. Other: Study quality is degraded by limited patient mobility and difficulty with breath holding. IMPRESSION: No evidence for acute abnormality of the RIGHT UPPER QUADRANT. Electronically Signed   By: Nolon Nations M.D.   On: 07/05/2021 09:23        Antibiotics: Anti-infectives (From admission, onward)    None         DVT prophylaxis: Patient on IV heparin  Code Status: Full code  Family Communication: No family at bedside   Consultants: Cardiology  Procedures:     Objective    Physical Examination:  General-appears in no acute distress Heart-S1-S2, regular, no murmur auscultated Lungs-clear to auscultation bilaterally, no wheezing or crackles auscultated Abdomen-soft, nontender, no organomegaly Extremities-no edema in the lower extremities Neuro-alert, oriented x3, no focal deficit noted  Status is: Inpatient  Dispo: The patient is from: Home              Anticipated d/c is to: Home versus skilled nursing facility              Anticipated d/c date is: 07/10/2021              Patient currently not stable for discharge  Barrier to discharge-ongoing evaluation and management for compression fractures, acute coronary syndrome  COVID-19 Labs  No results for input(s): DDIMER, FERRITIN, LDH, CRP in the last 72 hours.  Lab Results  Component Value Date   SARSCOV2NAA NEGATIVE 07/05/2021   [place] NEGATIVE 04/13/2021   [place] NEGATIVE 02/05/2021            Recent Results (from the past 240 hour(s))  Resp Panel by RT-PCR (Flu A&B, Covid) Nasopharyngeal Swab     Status: None   Collection Time: 07/05/21  3:02 AM   Specimen: Nasopharyngeal Swab; Nasopharyngeal(NP) swabs in vial transport medium  Result Value Ref Range Status   SARS Coronavirus 2 by RT PCR NEGATIVE NEGATIVE Final    Comment: (NOTE) SARS-CoV-2 target nucleic acids are NOT DETECTED.  The SARS-CoV-2 RNA is generally detectable in upper respiratory specimens during the acute phase of infection. The lowest concentration of SARS-CoV-2 viral copies this assay can detect is 138 copies/mL. A negative result does not preclude SARS-Cov-2 infection and should not be used as the sole basis for treatment or other patient  management decisions. A negative result may occur with  improper specimen collection/handling, submission of specimen other than nasopharyngeal swab, presence of viral mutation(s) within the areas targeted by this assay, and inadequate number of viral copies(<138 copies/mL). A negative result must be combined with clinical observations, patient history, and epidemiological information. The expected result is Negative.  Fact Sheet for Patients:  EntrepreneurPulse.com.au  Fact Sheet for Healthcare Providers:  IncredibleEmployment.be  This test is no t yet approved or cleared by the [place] FDA and  has been authorized for detection and/or diagnosis of SARS-CoV-2 by FDA under an Emergency Use Authorization (EUA). This EUA will remain  in effect (meaning this test can be used) for the duration of the COVID-19 declaration under Section 564(b)(1) of the Act, 21 U.S.C.section 360bbb-3(b)(1), unless the authorization is terminated  or revoked sooner.  Influenza A by PCR NEGATIVE NEGATIVE Final   Influenza B by PCR NEGATIVE NEGATIVE Final    Comment: (NOTE) The Xpert Xpress SARS-CoV-2/FLU/RSV plus assay is intended as an aid in the diagnosis of influenza from Nasopharyngeal swab specimens and should not be used as a sole basis for treatment. Nasal washings and aspirates are unacceptable for Xpert Xpress SARS-CoV-2/FLU/RSV testing.  Fact Sheet for Patients: EntrepreneurPulse.com.au  Fact Sheet for Healthcare Providers: IncredibleEmployment.be  This test is not yet approved or cleared by the [place] FDA and has been authorized for detection and/or diagnosis of SARS-CoV-2 by FDA under an Emergency Use Authorization (EUA). This EUA will remain in effect (meaning this test can be used) for the duration of the COVID-19 declaration under Section 564(b)(1) of the Act, 21 U.S.C. section 360bbb-3(b)(1),  unless the authorization is terminated or revoked.  Performed at [facility], [place] [address] [place], [place] [address]   MRSA Next Gen by PCR, Nasal     Status: None   Collection Time: 07/05/21  4:45 PM   Specimen: Nasal Mucosa; Nasal Swab  Result Value Ref Range Status   MRSA by PCR Next Gen NOT DETECTED NOT DETECTED Final    Comment: (NOTE) The GeneXpert MRSA Assay (FDA approved for NASAL specimens only), is one component of a comprehensive MRSA colonization surveillance program. It is not intended to diagnose MRSA infection nor to guide or monitor treatment for MRSA infections. Test performance is not FDA approved in patients less than 62 years old. Performed at [facility], [place] [address], [place], [place] [address]     [facility] If 7PM-7AM, please contact night-coverage at [email or web address], Office  [phone]   07/06/2021, 8:17 AM  LOS: 1 day

## 2021-07-06 NOTE — Progress Notes (Signed)
ANTICOAGULATION CONSULT NOTE - Follow Up Consult  Pharmacy Consult for Heparin Indication:  NSTEMI  Allergies  Allergen Reactions   Actonel [Risedronate Sodium]     bloating   Evista [Raloxifene]     Abdominal cramps   Fosamax [Alendronate Sodium]     Abdominal cramps    Patient Measurements: Height: 5\' 1"  (154.9 cm) Weight: 46 kg (101 lb 6.6 oz) IBW/kg (Calculated) : 47.8 Heparin Dosing Weight: 46kg  Vital Signs: Temp: 98.2 F (36.8 C) (12/31 0740) Temp Source: Oral (12/31 0740) BP: 151/69 (12/31 0740) Pulse Rate: 73 (12/31 0740)  Labs: Recent Labs    07/05/21 0102 07/05/21 0244 07/05/21 0315 07/05/21 0645 07/05/21 0835 07/05/21 1018 07/05/21 1343 07/05/21 2000 07/06/21 0308  HGB 9.8*  --   --   --   --   --   --   --  9.1*  HCT 29.4*  --   --   --   --   --   --   --  27.4*  PLT 362  --   --   --   --   --   --   --  328  APTT  --   --  23*  --   --   --   --   --   --   LABPROT  --   --  14.0  --   --   --   --   --   --   INR  --   --  1.1  --   --   --   --   --   --   HEPARINUNFRC  --   --   --   --   --  <0.10*  --  0.32 0.34  CREATININE  --  0.31*  --   --   --   --   --   --   --   CKTOTAL  --   --   --   --   --  283*  --   --   --   TROPONINIHS 739* 1,626*  --    < > 2,144* 2,434* 2,444*  --   --    < > = values in this interval not displayed.    Estimated Creatinine Clearance: 38 mL/min (A) (by C-G formula based on SCr of 0.31 mg/dL (L)).  Assessment: Anticoag: - NSTEMI, heparin x48 h per cards - Hep level 0.34. Hgb 9.1 stable , Plts 328  Goal of Therapy:  Heparin level 0.3-0.7 units/ml Monitor platelets by anticoagulation protocol: Yes   Plan:  Con't IV heparin at 750 units/hr through tomorrow AM (48hr) Daily HL and CBC   Jeany Seville S. 03-23-1971, PharmD, BCPS Clinical Staff Pharmacist [email or web address], Merilynn Finland 07/06/2021,8:44 AM

## 2021-07-07 DIAGNOSIS — I10 Essential (primary) hypertension: Secondary | ICD-10-CM | POA: Diagnosis not present

## 2021-07-07 DIAGNOSIS — S22050A Wedge compression fracture of T5-T6 vertebra, initial encounter for closed fracture: Secondary | ICD-10-CM | POA: Diagnosis not present

## 2021-07-07 DIAGNOSIS — I248 Other forms of acute ischemic heart disease: Secondary | ICD-10-CM | POA: Diagnosis not present

## 2021-07-07 LAB — BASIC METABOLIC PANEL
Anion gap: 9 (ref 5–15)
BUN: 11 mg/dL (ref 8–23)
CO2: 25 mmol/L (ref 22–32)
Calcium: 8.8 mg/dL — ABNORMAL LOW (ref 8.9–10.3)
Chloride: 98 mmol/L (ref 98–111)
Creatinine, Ser: 0.49 mg/dL (ref 0.44–1.00)
GFR, Estimated: >60 mL/min (ref >60)
Glucose, Bld: 97 mg/dL (ref 70–99)
Potassium: 3.6 mmol/L (ref 3.5–5.1)
Sodium: 132 mmol/L — ABNORMAL LOW (ref 135–145)

## 2021-07-07 MED ORDER — AMLODIPINE BESYLATE 2.5 MG PO TABS
2.5000 mg | ORAL_TABLET | Freq: Every day | ORAL | Status: DC
  Administered 2021-07-07: 2.5 mg via ORAL
  Filled 2021-07-07: qty 1

## 2021-07-07 NOTE — Progress Notes (Signed)
DAILY PROGRESS NOTE   Patient Name: Natasha Harper Date of Encounter: 07/07/2021 Cardiologist: Dorris Carnes, MD  Chief Complaint   No chest pain  Patient Profile   Natasha Harper is a 87 y.o. female with a hx of CAD s/p remote PCI to RCA and diagonal, carotid plaque, hypothyroidism, hyperlipidemia and recurrent fall who is being seen 07/05/2021 for the evaluation of non-STEMI at the request of Dr. Velia Meyer.   History of CAD s/p PCI to RCA and diagonal in 1993.  Has residual 70% LAD lesion.  No ischemia on Myoview in 2007 & 2013.  Carotid doppler January 2022 showed stable 1 to 39% right ICA stenosis and mild plaquing of left ICA.  Subjective   No chest pain. Blood pressure remains elevated.  Objective   Vitals:   07/07/21 0500 07/07/21 0713 07/07/21 1101 07/07/21 1108  BP:  (!) 170/78 (!) 168/67 140/73  Pulse:  61 (!) 57 61  Resp:  15 15 19   Temp:  97.6 F (36.4 C) 97.8 F (36.6 C)   TempSrc:  Oral Axillary   SpO2:  99% 94% 95%  Weight: 46.3 kg     Height:        Intake/Output Summary (Last 24 hours) at 07/07/2021 1122 Last data filed at 07/07/2021 0600 Gross per 24 hour  Intake 192.62 ml  Output 1050 ml  Net -857.38 ml   Filed Weights   07/05/21 1010 07/07/21 0500  Weight: 46 kg 46.3 kg    Physical Exam   General appearance: alert, cachectic, and no distress Neck: no carotid bruit, no JVD, and thyroid not enlarged, symmetric, no tenderness/mass/nodules Lungs: clear to auscultation bilaterally Heart: regular rate and rhythm Abdomen: soft, non-tender; bowel sounds normal; no masses,  no organomegaly Extremities: extremities normal, atraumatic, no cyanosis or edema Pulses: 2+ and symmetric Skin: Skin color, texture, turgor normal. No rashes or lesions Neurologic: Grossly normal Psych: Pleasant  Inpatient Medications    Scheduled Meds:  aspirin EC  81 mg Oral Daily   atorvastatin  80 mg Oral Daily   heparin  5,000 Units Subcutaneous Q8H   losartan  50 mg  Oral Daily   metoprolol tartrate  12.5 mg Oral BID    Continuous Infusions:    PRN Meds: acetaminophen **OR** acetaminophen, fentaNYL (SUBLIMAZE) injection, metoprolol tartrate, naLOXone (NARCAN)  injection, ondansetron (ZOFRAN) IV   Labs   Results for orders placed or performed during the hospital encounter of 07/04/21 (from the past 48 hour(s))  Troponin I (High Sensitivity)     Status: Abnormal   Collection Time: 07/05/21  1:43 PM  Result Value Ref Range   Troponin I (High Sensitivity) 2,444 (HH) <18 ng/L    Comment: CRITICAL VALUE NOTED.  VALUE IS CONSISTENT WITH PREVIOUSLY REPORTED AND CALLED VALUE. (NOTE) Elevated high sensitivity troponin I (hsTnI) values and significant  changes across serial measurements may suggest ACS but many other  chronic and acute conditions are known to elevate hsTnI results.  Refer to the Links section for chest pain algorithms and additional  guidance. Performed at [facility], [place] [address], [place], [place] [address]   MRSA Next Gen by PCR, Nasal     Status: None   Collection Time: 07/05/21  4:45 PM   Specimen: Nasal Mucosa; Nasal Swab  Result Value Ref Range   MRSA by PCR Next Gen NOT DETECTED NOT DETECTED    Comment: (NOTE) The GeneXpert MRSA Assay (FDA approved for NASAL specimens only), is one component  of a comprehensive MRSA colonization surveillance program. It is not intended to diagnose MRSA infection nor to guide or monitor treatment for MRSA infections. Test performance is not FDA approved in patients less than 76 years old. Performed at [facility], [place] [address], [place], [place] [address]   Heparin level (unfractionated)     Status: None   Collection Time: 07/05/21  8:00 PM  Result Value Ref Range   Heparin Unfractionated 0.32 0.30 - 0.70 IU/mL    Comment: (NOTE) The clinical reportable range upper limit is being lowered to >1.10 to align with the FDA approved guidance for the current  laboratory assay.  If heparin results are below expected values, and patient dosage has  been confirmed, suggest follow up testing of antithrombin III levels. Performed at [facility], [place] [address], [place], [place] [address]   CBC     Status: Abnormal   Collection Time: 07/06/21  3:08 AM  Result Value Ref Range   WBC 7.1 4.0 - 10.5 K/uL   RBC 2.69 (L) 3.87 - 5.11 MIL/uL   Hemoglobin 9.1 (L) 12.0 - 15.0 g/dL   HCT 27.4 (L) 36.0 - 46.0 %   MCV 101.9 (H) 80.0 - 100.0 fL   MCH 33.8 26.0 - 34.0 pg   MCHC 33.2 30.0 - 36.0 g/dL   RDW 13.3 11.5 - 15.5 %   Platelets 328 150 - 400 K/uL   nRBC 0.0 0.0 - 0.2 %    Comment: Performed at [facility], [place] [address], [place], [place] [address]  Lipid panel     Status: Abnormal   Collection Time: 07/06/21  3:08 AM  Result Value Ref Range   Cholesterol 181 0 - 200 mg/dL   Triglycerides 47 <150 mg/dL   HDL 56 >40 mg/dL   Total CHOL/HDL Ratio 3.2 RATIO   VLDL 9 0 - 40 mg/dL   LDL Cholesterol 116 (H) 0 - 99 mg/dL    Comment:        Total Cholesterol/HDL:CHD Risk Coronary Heart Disease Risk Table                     Men   Women  1/2 Average Risk   3.4   3.3  Average Risk       5.0   4.4  2 X Average Risk   9.6   7.1  3 X Average Risk  23.4   11.0        Use the calculated Patient Ratio above and the CHD Risk Table to determine the patient's CHD Risk.        ATP III CLASSIFICATION (LDL):  <100     mg/dL   Optimal  100-129  mg/dL   Near or Above                    Optimal  130-159  mg/dL   Borderline  160-189  mg/dL   High  >190     mg/dL   Very High Performed at [place] [address] [place], [place] [address]   Heparin level (unfractionated)     Status: None   Collection Time: 07/06/21  3:08 AM  Result Value Ref Range   Heparin Unfractionated 0.34 0.30 - 0.70 IU/mL    Comment: (NOTE) The clinical reportable range upper limit is being lowered to >1.10 to align with the FDA approved guidance for the  current laboratory assay.  If heparin results are below expected values, and patient dosage has  been confirmed, suggest follow up testing of antithrombin III levels. Performed at [facility], [place] [address], [place], [place] [address]   Iron and TIBC     Status: None   Collection Time: 07/06/21 10:08 AM  Result Value Ref Range   Iron 47 28 - 170 ug/dL   TIBC 351 250 - 450 ug/dL   Saturation Ratios 13 10.4 - 31.8 %   UIBC 304 ug/dL    Comment: Performed at [facility], [place] [address] [place], [place] [address]  Ferritin     Status: None   Collection Time: 07/06/21 10:08 AM  Result Value Ref Range   Ferritin 67 11 - 307 ng/mL    Comment: Performed at [facility], [place] [address], [place], [place] [address]  Creatinine, serum     Status: Abnormal   Collection Time: 07/06/21 10:08 AM  Result Value Ref Range   Creatinine, Ser 1.13 (H) 0.44 - 1.00 mg/dL   GFR, Estimated 48 (L) >60 mL/min    Comment: (NOTE) Calculated using the CKD-EPI Creatinine Equation (2021) Performed at [place] [address] [place], [place] [address]   CBC     Status: Abnormal   Collection Time: 07/06/21  4:18 PM  Result Value Ref Range   WBC 6.1 4.0 - 10.5 K/uL   RBC 2.70 (L) 3.87 - 5.11 MIL/uL   Hemoglobin 9.5 (L) 12.0 - 15.0 g/dL   HCT 27.6 (L) 36.0 - 46.0 %   MCV 102.2 (H) 80.0 - 100.0 fL   MCH 35.2 (H) 26.0 - 34.0 pg   MCHC 34.4 30.0 - 36.0 g/dL   RDW 13.3 11.5 - 15.5 %   Platelets 313 150 - 400 K/uL   nRBC 0.0 0.0 - 0.2 %    Comment: Performed at [facility], [place] [address], [place], [place] [id number]  Basic metabolic panel     Status: Abnormal   Collection Time: 07/07/21  3:26 AM  Result Value Ref Range   Sodium 132 (L) 135 - 145 mmol/L   Potassium 3.6 3.5 - 5.1 mmol/L   Chloride 98 98 - 111 mmol/L   CO2 25 22 - 32 mmol/L   Glucose, Bld 97 70 - 99 mg/dL    Comment: Glucose reference range applies only to samples taken after fasting for at least 8  hours.   BUN 11 8 - 23 mg/dL   Creatinine, Ser 0.49 0.44 - 1.00 mg/dL   Calcium 8.8 (L) 8.9 - 10.3 mg/dL   GFR, Estimated >60 >60 mL/min    Comment: (NOTE) Calculated using the CKD-EPI Creatinine Equation (2021)    Anion gap 9 5 - 15    Comment: Performed at [place] [address], [place], [place] [address]    ECG   N/A  Telemetry   Sinus rhythm with PAC's - Personally Reviewed  Radiology    ECHOCARDIOGRAM COMPLETE  Result Date: 07/05/2021    ECHOCARDIOGRAM REPORT   Patient Name:   Natasha Harper [facility] Date of Exam: 07/05/2021 Medical Rec #:  [id number]       Height:       61.0 in Accession #:    [id number]      Weight:       101.4 lb Date of Birth:  1935-01-03      BSA:  1.415 m Patient Age:    54 years        BP:           165/72 mmHg Patient Gender: F               HR:           68 bpm. Exam Location:  Inpatient Procedure: 2D Echo, Color Doppler and Cardiac Doppler Indications:    NSTEMI  History:        Patient has no prior history of Echocardiogram examinations.                 Risk Factors:Hypertension and Dyslipidemia.  Sonographer:    Jyl Heinz Referring Phys: [id number] [place]  1. Left ventricular ejection fraction, by estimation, is 55 to 60%. The left ventricle has normal function. The left ventricle has no regional wall motion abnormalities. There is mild concentric left ventricular hypertrophy. Left ventricular diastolic parameters are consistent with Grade I diastolic dysfunction (impaired relaxation).  2. Right ventricular systolic function is normal. The right ventricular size is normal. There is normal pulmonary artery systolic pressure. The estimated right ventricular systolic pressure is 0000000 mmHg.  3. A small pericardial effusion is present. The pericardial effusion is posterior to the left ventricle.  4. The mitral valve is grossly normal. Mild mitral valve regurgitation. No evidence of mitral stenosis.  5. The aortic valve is  tricuspid. There is mild calcification of the aortic valve. There is mild thickening of the aortic valve. Aortic valve regurgitation is not visualized. Aortic valve sclerosis is present, with no evidence of aortic valve stenosis.  6. The inferior vena cava is normal in size with greater than 50% respiratory variability, suggesting right atrial pressure of 3 mmHg. FINDINGS  Left Ventricle: Left ventricular ejection fraction, by estimation, is 55 to 60%. The left ventricle has normal function. The left ventricle has no regional wall motion abnormalities. The left ventricular internal cavity size was normal in size. There is  mild concentric left ventricular hypertrophy. Left ventricular diastolic parameters are consistent with Grade I diastolic dysfunction (impaired relaxation). Right Ventricle: The right ventricular size is normal. No increase in right ventricular wall thickness. Right ventricular systolic function is normal. There is normal pulmonary artery systolic pressure. The tricuspid regurgitant velocity is 2.67 m/s, and  with an assumed right atrial pressure of 3 mmHg, the estimated right ventricular systolic pressure is 0000000 mmHg. Left Atrium: Left atrial size was normal in size. Right Atrium: Right atrial size was normal in size. Pericardium: A small pericardial effusion is present. The pericardial effusion is posterior to the left ventricle. Mitral Valve: The mitral valve is grossly normal. Mild mitral valve regurgitation. No evidence of mitral valve stenosis. Tricuspid Valve: The tricuspid valve is grossly normal. Tricuspid valve regurgitation is trivial. No evidence of tricuspid stenosis. Aortic Valve: The aortic valve is tricuspid. There is mild calcification of the aortic valve. There is mild thickening of the aortic valve. Aortic valve regurgitation is not visualized. Aortic valve sclerosis is present, with no evidence of aortic valve stenosis. Aortic valve peak gradient measures 5.5 mmHg. Pulmonic  Valve: The pulmonic valve was grossly normal. Pulmonic valve regurgitation is trivial. No evidence of pulmonic stenosis. Aorta: The aortic root and ascending aorta are structurally normal, with no evidence of dilitation. Venous: The inferior vena cava is normal in size with greater than 50% respiratory variability, suggesting right atrial pressure of 3 mmHg. IAS/Shunts: The atrial septum is grossly normal.  LEFT VENTRICLE PLAX 2D LVIDd:         4.00 cm     Diastology LVIDs:         2.50 cm     LV e' medial:    4.46 cm/s LV PW:         1.30 cm     LV E/e' medial:  17.4 LV IVS:        1.10 cm     LV e' lateral:   5.87 cm/s LVOT diam:     1.80 cm     LV E/e' lateral: 13.2 LV SV:         37 LV SV Index:   26 LVOT Area:     2.54 cm  LV Volumes (MOD) LV vol d, MOD A2C: 95.2 ml LV vol d, MOD A4C: 99.0 ml LV vol s, MOD A2C: 45.0 ml LV vol s, MOD A4C: 46.6 ml LV SV MOD A2C:     50.2 ml LV SV MOD A4C:     99.0 ml LV SV MOD BP:      53.4 ml RIGHT VENTRICLE             IVC RV Basal diam:  3.90 cm     IVC diam: 1.20 cm RV Mid diam:    3.70 cm RV S prime:     14.40 cm/s TAPSE (M-mode): 2.2 cm LEFT ATRIUM           Index        RIGHT ATRIUM           Index LA diam:      3.50 cm 2.47 cm/m   RA Area:     12.60 cm LA Vol (A2C): 25.6 ml 18.09 ml/m  RA Volume:   26.10 ml  18.44 ml/m LA Vol (A4C): 37.4 ml 26.43 ml/m  AORTIC VALVE AV Area (Vmax): 1.61 cm AV Vmax:        117.00 cm/s AV Peak Grad:   5.5 mmHg LVOT Vmax:      73.80 cm/s LVOT Vmean:     52.600 cm/s LVOT VTI:       0.146 m  AORTA Ao Root diam: 2.70 cm Ao Asc diam:  2.90 cm MITRAL VALVE               TRICUSPID VALVE MV Area (PHT): 4.15 cm    TR Peak grad:   28.5 mmHg MV Decel Time: 183 msec    TR Vmax:        267.00 cm/s MV E velocity: 77.60 cm/s MV A velocity: 95.50 cm/s  SHUNTS MV E/A ratio:  0.81        Systemic VTI:  0.15 m                            Systemic Diam: 1.80 cm Natasha Chiquito MD Electronically signed by Natasha Chiquito MD Signature Date/Time:  07/05/2021/1:54:33 PM    Final     Cardiac Studies   See echo  Assessment   Principal Problem:   Thoracic compression fracture [facility]) Active Problems:   Elevated troponin   Demand ischemia (HCC) Demand ischemia  Plan   No chest pain - bp up, agree that amlodipine could be added. Will start 2.5 mg daily as she has had some labile pressures - can increase later if needed. Otherwise no further suggestions. Follow-up with Dr. Harrington Challenger after discharge.  [facility] will sign off.  Medication Recommendations:  as above Other recommendations (labs, testing, etc):  none Follow up as an outpatient:  Dr. Harrington Challenger   Time Spent Directly with Patient:  I have spent a total of 25 minutes with the patient reviewing hospital notes, telemetry, EKGs, labs and examining the patient as well as establishing an assessment and plan that was discussed personally with the patient.  > 50% of time was spent in direct patient care.  Length of Stay:  LOS: 2 days   Pixie Casino, MD, [facility] [place] Director of the Advanced Lipid Disorders &  Cardiovascular Risk Reduction Clinic Diplomate of the American Board of Clinical Lipidology Attending Cardiologist  Direct Dial: [phone]   Fax: [phone]  Website:  [email or web address]  Nadean Corwin Madix Blowe 07/07/2021, 11:22 AM

## 2021-07-07 NOTE — Progress Notes (Signed)
PROGRESS NOTE    Natasha Harper  [id number] DOB: 17-Nov-1934 DOA: 07/04/2021 PCP: Burnis Medin, MD   Chief Complain: Back pain, fall  Brief Narrative: Patient is a 87 year old female with history of hypothyroidism, hyperlipidemia, coronary artery disease, hypertension who presented with back pain and falls from home.  She fell at home and is unable to get up.  On presentation CT of the thoracic spine showed T1 and T6 compression fracture, lab work showed elevated troponin.  Started on heparin drip on admission now discontinued by cardiology because elevated troponin thought to be from demand ischemia.  IR consulted for kyphoplasty.  Assessment & Plan:   Principal Problem:   Thoracic compression fracture [facility]) Active Problems:   Elevated troponin   Demand ischemia (HCC)   NSTEMI/history of coronary artery disease: Troponin was elevated troponin in the range of 2K but later trended was flat .  She has history of coronary artery disease status post PCI to RCA and diagonal in 1993 .  Has residual 70% LAD lesion. Echo done here showed left ventricular ejection fraction of 55-60%, normal regional wall motion.  Elevated troponin felt to be from demand ischemia.  Patient currently denies any chest pain.  Heparin drip discontinued.  No plans for cardiac cath.  Cardiology is following. No complaint of chest pain but she has sternal tenderness likely related to fall  Fall/compression fracture: CT thoracic spine showed T1, T6 compression fracture with 10 to 20% height loss with no retropulsion or spinal canal stenosis.  Continue pain management.  IR has been consulted for the consideration of kyphoplasty.  PT and OT  consulted   Hypertension: Currently hypertensive.  On losartan and metoprolol which have been continued.  Monitor blood pressure.  Continue as needed medications for severe hypertension.  Elevated blood pressure likely from pain.  We will add amlodipine 5 mg daily if she  continues to remain hypertensive  Hyperlipidemia: On Lipitor  Right breast abnormality: As seen on CTA: 2X 1.9 cm high-attenuation lesion in the right. She has breast lumps for years and was following with GYN, had mammograms, excisional biopsy of one of her breast was found to be benign.  She does not want further investigation.  If she wishes, outpatient mammogram/breast ultrasound recommended as outpatient to exclude neoplasm of breast  Elevated ALP: GGT elevated, AST, ALT mild elevated.  Abdominal ultrasound did not show any abnormality.  Monitor  Macrocytic anemia: Currently hemoglobin stable in the range of 9.  Iron studies showed normal iron.  Normal vitamin [id number] and folic acid  Hyponatremia: Mild, stable, continue to monitor          DVT prophylaxis:Heparin [place] Code Status: Full Family Communication: None at bed side Patient status:Inpatient  Dispo: The patient is from: Home              Anticipated d/c is to: Home              Anticipated d/c date is: Unsure at this point.  Waiting for kyphoplasty, PT/OT evaluation will determine disposition  Consultants: Cardiology, IR  Procedures: None  Antimicrobials:  Anti-infectives (From admission, onward)    None       Subjective: Patient seen and examined the bedside this morning.  Hemodynamically stable comfortable.  Lying on bed, eating her breakfast.  Denies any chest pain or back pain when she does not move.    Objective: Vitals:   07/06/21 2308 07/07/21 0308 07/07/21 0500 07/07/21 0713  BP: (!) 149/70 Marland Kitchen)  183/72  (!) 170/78  Pulse: 61 66  61  Resp: 17 17  15   Temp: 99 F (37.2 C) 99 F (37.2 C)  97.6 F (36.4 C)  TempSrc: Oral Oral  Oral  SpO2: 100% 96%  99%  Weight:   46.3 kg   Height:        Intake/Output Summary (Last 24 hours) at 07/07/2021 0818 Last data filed at 07/07/2021 0600 Gross per 24 hour  Intake 192.62 ml  Output 1050 ml  Net -857.38 ml   Filed Weights   07/05/21 1010 07/07/21 0500   Weight: 46 kg 46.3 kg    Examination:  General exam: Pleasant elderly female, deconditioned HEENT: PERRL Respiratory system:  no wheezes or crackles  Cardiovascular system: S1 & S2 heard, RRR.  Sternal tenderness on palpation Gastrointestinal system: Abdomen is nondistended, soft and nontender. Central nervous system: Alert and oriented Extremities: No edema, no clubbing ,no cyanosis Skin: No rashes, no ulcers,no icterus      Data Reviewed: I have personally reviewed following labs and imaging studies  CBC: Recent Labs  Lab 07/05/21 0102 07/06/21 0308 07/06/21 1618  WBC 8.1 7.1 6.1  NEUTROABS 7.1  --   --   HGB 9.8* 9.1* 9.5*  HCT 29.4* 27.4* 27.6*  MCV 103.9* 101.9* 102.2*  PLT 362 328 [id number]   Basic Metabolic Panel: Recent Labs  Lab 07/05/21 0244 07/06/21 1008 07/07/21 0326  NA 131*  --  132*  K 4.4  --  3.6  CL 100  --  98  CO2 23  --  25  GLUCOSE 117*  --  97  BUN 12  --  11  CREATININE 0.31* 1.13* 0.49  CALCIUM 8.8*  --  8.8*   GFR: Estimated Creatinine Clearance: 38.3 mL/min (by C-G formula based on SCr of 0.49 mg/dL). Liver Function Tests: Recent Labs  Lab 07/05/21 0244  AST 67*  ALT 52*  ALKPHOS 606*  BILITOT 0.9  PROT 6.9  ALBUMIN 3.2*   No results for input(s): LIPASE, AMYLASE in the last 168 hours. No results for input(s): AMMONIA in the last 168 hours. Coagulation Profile: Recent Labs  Lab 07/05/21 0315  INR 1.1   Cardiac Enzymes: Recent Labs  Lab 07/05/21 1018  CKTOTAL 283*   BNP (last 3 results) No results for input(s): PROBNP in the last 8760 hours. HbA1C: No results for input(s): HGBA1C in the last 72 hours. CBG: No results for input(s): GLUCAP in the last 168 hours. Lipid Profile: Recent Labs    07/06/21 0308  CHOL 181  HDL 56  LDLCALC 116*  TRIG 47  CHOLHDL 3.2   Thyroid Function Tests: No results for input(s): TSH, T4TOTAL, FREET4, T3FREE, THYROIDAB in the last 72 hours. Anemia Panel: Recent Labs     07/05/21 1018 07/06/21 1008  VITAMINB12 474  --   FOLATE 34.8  --   FERRITIN  --  67  TIBC  --  351  IRON  --  47   Sepsis Labs: No results for input(s): PROCALCITON, LATICACIDVEN in the last 168 hours.  Recent Results (from the past 240 hour(s))  Resp Panel by RT-PCR (Flu A&B, Covid) Nasopharyngeal Swab     Status: None   Collection Time: 07/05/21  3:02 AM   Specimen: Nasopharyngeal Swab; Nasopharyngeal(NP) swabs in vial transport medium  Result Value Ref Range Status   SARS Coronavirus 2 by RT PCR NEGATIVE NEGATIVE Final    Comment: (NOTE) SARS-CoV-2 target nucleic acids are NOT DETECTED.  The  SARS-CoV-2 RNA is generally detectable in upper respiratory specimens during the acute phase of infection. The lowest concentration of SARS-CoV-2 viral copies this assay can detect is 138 copies/mL. A negative result does not preclude SARS-Cov-2 infection and should not be used as the sole basis for treatment or other patient management decisions. A negative result may occur with  improper specimen collection/handling, submission of specimen other than nasopharyngeal swab, presence of viral mutation(s) within the areas targeted by this assay, and inadequate number of viral copies(<138 copies/mL). A negative result must be combined with clinical observations, patient history, and epidemiological information. The expected result is Negative.  Fact Sheet for Patients:  EntrepreneurPulse.com.au  Fact Sheet for Healthcare Providers:  IncredibleEmployment.be  This test is no t yet approved or cleared by the [place] FDA and  has been authorized for detection and/or diagnosis of SARS-CoV-2 by FDA under an Emergency Use Authorization (EUA). This EUA will remain  in effect (meaning this test can be used) for the duration of the COVID-19 declaration under Section 564(b)(1) of the Act, 21 U.S.C.section 360bbb-3(b)(1), unless the authorization is  terminated  or revoked sooner.       Influenza A by PCR NEGATIVE NEGATIVE Final   Influenza B by PCR NEGATIVE NEGATIVE Final    Comment: (NOTE) The Xpert Xpress SARS-CoV-2/FLU/RSV plus assay is intended as an aid in the diagnosis of influenza from Nasopharyngeal swab specimens and should not be used as a sole basis for treatment. Nasal washings and aspirates are unacceptable for Xpert Xpress SARS-CoV-2/FLU/RSV testing.  Fact Sheet for Patients: EntrepreneurPulse.com.au  Fact Sheet for Healthcare Providers: IncredibleEmployment.be  This test is not yet approved or cleared by the [place] FDA and has been authorized for detection and/or diagnosis of SARS-CoV-2 by FDA under an Emergency Use Authorization (EUA). This EUA will remain in effect (meaning this test can be used) for the duration of the COVID-19 declaration under Section 564(b)(1) of the Act, 21 U.S.C. section 360bbb-3(b)(1), unless the authorization is terminated or revoked.  Performed at [facility], [place] [address] [place], [place] [address]   MRSA Next Gen by PCR, Nasal     Status: None   Collection Time: 07/05/21  4:45 PM   Specimen: Nasal Mucosa; Nasal Swab  Result Value Ref Range Status   MRSA by PCR Next Gen NOT DETECTED NOT DETECTED Final    Comment: (NOTE) The GeneXpert MRSA Assay (FDA approved for NASAL specimens only), is one component of a comprehensive MRSA colonization surveillance program. It is not intended to diagnose MRSA infection nor to guide or monitor treatment for MRSA infections. Test performance is not FDA approved in patients less than 38 years old. Performed at [facility], [place] [address], [place], [place] [address]          Radiology Studies: ECHOCARDIOGRAM COMPLETE  Result Date: 07/05/2021    ECHOCARDIOGRAM REPORT   Patient Name:   KALLYSTA LANASA [facility] Date of Exam: 07/05/2021 Medical Rec #:  [id number]        Height:       61.0 in Accession #:    [id number]      Weight:       101.4 lb Date of Birth:  26-Jun-1935      BSA:          1.415 m Patient Age:    89 years        BP:           165/72 mmHg Patient  Gender: F               HR:           68 bpm. Exam Location:  Inpatient Procedure: 2D Echo, Color Doppler and Cardiac Doppler Indications:    NSTEMI  History:        Patient has no prior history of Echocardiogram examinations.                 Risk Factors:Hypertension and Dyslipidemia.  Sonographer:    Jyl Heinz Referring Phys: [id number] [place]  1. Left ventricular ejection fraction, by estimation, is 55 to 60%. The left ventricle has normal function. The left ventricle has no regional wall motion abnormalities. There is mild concentric left ventricular hypertrophy. Left ventricular diastolic parameters are consistent with Grade I diastolic dysfunction (impaired relaxation).  2. Right ventricular systolic function is normal. The right ventricular size is normal. There is normal pulmonary artery systolic pressure. The estimated right ventricular systolic pressure is 0000000 mmHg.  3. A small pericardial effusion is present. The pericardial effusion is posterior to the left ventricle.  4. The mitral valve is grossly normal. Mild mitral valve regurgitation. No evidence of mitral stenosis.  5. The aortic valve is tricuspid. There is mild calcification of the aortic valve. There is mild thickening of the aortic valve. Aortic valve regurgitation is not visualized. Aortic valve sclerosis is present, with no evidence of aortic valve stenosis.  6. The inferior vena cava is normal in size with greater than 50% respiratory variability, suggesting right atrial pressure of 3 mmHg. FINDINGS  Left Ventricle: Left ventricular ejection fraction, by estimation, is 55 to 60%. The left ventricle has normal function. The left ventricle has no regional wall motion abnormalities. The left ventricular internal cavity size was  normal in size. There is  mild concentric left ventricular hypertrophy. Left ventricular diastolic parameters are consistent with Grade I diastolic dysfunction (impaired relaxation). Right Ventricle: The right ventricular size is normal. No increase in right ventricular wall thickness. Right ventricular systolic function is normal. There is normal pulmonary artery systolic pressure. The tricuspid regurgitant velocity is 2.67 m/s, and  with an assumed right atrial pressure of 3 mmHg, the estimated right ventricular systolic pressure is 0000000 mmHg. Left Atrium: Left atrial size was normal in size. Right Atrium: Right atrial size was normal in size. Pericardium: A small pericardial effusion is present. The pericardial effusion is posterior to the left ventricle. Mitral Valve: The mitral valve is grossly normal. Mild mitral valve regurgitation. No evidence of mitral valve stenosis. Tricuspid Valve: The tricuspid valve is grossly normal. Tricuspid valve regurgitation is trivial. No evidence of tricuspid stenosis. Aortic Valve: The aortic valve is tricuspid. There is mild calcification of the aortic valve. There is mild thickening of the aortic valve. Aortic valve regurgitation is not visualized. Aortic valve sclerosis is present, with no evidence of aortic valve stenosis. Aortic valve peak gradient measures 5.5 mmHg. Pulmonic Valve: The pulmonic valve was grossly normal. Pulmonic valve regurgitation is trivial. No evidence of pulmonic stenosis. Aorta: The aortic root and ascending aorta are structurally normal, with no evidence of dilitation. Venous: The inferior vena cava is normal in size with greater than 50% respiratory variability, suggesting right atrial pressure of 3 mmHg. IAS/Shunts: The atrial septum is grossly normal.  LEFT VENTRICLE PLAX 2D LVIDd:         4.00 cm     Diastology LVIDs:         2.50 cm  LV e' medial:    4.46 cm/s LV PW:         1.30 cm     LV E/e' medial:  17.4 LV IVS:        1.10 cm     LV e'  lateral:   5.87 cm/s LVOT diam:     1.80 cm     LV E/e' lateral: 13.2 LV SV:         37 LV SV Index:   26 LVOT Area:     2.54 cm  LV Volumes (MOD) LV vol d, MOD A2C: 95.2 ml LV vol d, MOD A4C: 99.0 ml LV vol s, MOD A2C: 45.0 ml LV vol s, MOD A4C: 46.6 ml LV SV MOD A2C:     50.2 ml LV SV MOD A4C:     99.0 ml LV SV MOD BP:      53.4 ml RIGHT VENTRICLE             IVC RV Basal diam:  3.90 cm     IVC diam: 1.20 cm RV Mid diam:    3.70 cm RV S prime:     14.40 cm/s TAPSE (M-mode): 2.2 cm LEFT ATRIUM           Index        RIGHT ATRIUM           Index LA diam:      3.50 cm 2.47 cm/m   RA Area:     12.60 cm LA Vol (A2C): 25.6 ml 18.09 ml/m  RA Volume:   26.10 ml  18.44 ml/m LA Vol (A4C): 37.4 ml 26.43 ml/m  AORTIC VALVE AV Area (Vmax): 1.61 cm AV Vmax:        117.00 cm/s AV Peak Grad:   5.5 mmHg LVOT Vmax:      73.80 cm/s LVOT Vmean:     52.600 cm/s LVOT VTI:       0.146 m  AORTA Ao Root diam: 2.70 cm Ao Asc diam:  2.90 cm MITRAL VALVE               TRICUSPID VALVE MV Area (PHT): 4.15 cm    TR Peak grad:   28.5 mmHg MV Decel Time: 183 msec    TR Vmax:        267.00 cm/s MV E velocity: 77.60 cm/s MV A velocity: 95.50 cm/s  SHUNTS MV E/A ratio:  0.81        Systemic VTI:  0.15 m                            Systemic Diam: 1.80 cm Eleonore Chiquito MD Electronically signed by Eleonore Chiquito MD Signature Date/Time: 07/05/2021/1:54:33 PM    Final    US Abdomen Limited RUQ (LIVER/GB)  Result Date: 07/05/2021 CLINICAL DATA:  Elevated alkaline phosphatase. EXAM: ULTRASOUND ABDOMEN LIMITED RIGHT UPPER QUADRANT COMPARISON:  None. FINDINGS: Gallbladder: Gallbladder wall is UPPER limits normal, measuring 1.5 millimeters. No sonographic Murphy's sign. No stones or pericholecystic fluid. Common bile duct: Diameter: 3.0 millimeters Liver: No focal lesion identified. Within normal limits in parenchymal echogenicity. Portal vein is patent on color Doppler imaging with normal direction of blood flow towards the liver. Other: Study  quality is degraded by limited patient mobility and difficulty with breath holding. IMPRESSION: No evidence for acute abnormality of the RIGHT UPPER QUADRANT. Electronically Signed   By: Nolon Nations M.D.   On: 07/05/2021  09:23        Scheduled Meds:  aspirin EC  81 mg Oral Daily   atorvastatin  80 mg Oral Daily   heparin  5,000 Units Subcutaneous Q8H   losartan  50 mg Oral Daily   metoprolol tartrate  12.5 mg Oral BID   Continuous Infusions:   LOS: 2 days    Time spent:35 mins. More than 50% of that time was spent in counseling and/or coordination of care.      Shelly Coss, MD [facility] P1/07/2021, 8:18 AM

## 2021-07-08 DIAGNOSIS — S22050A Wedge compression fracture of T5-T6 vertebra, initial encounter for closed fracture: Secondary | ICD-10-CM | POA: Diagnosis not present

## 2021-07-08 LAB — COMPREHENSIVE METABOLIC PANEL
ALT: 40 U/L (ref 0–44)
AST: 44 U/L — ABNORMAL HIGH (ref 15–41)
Albumin: 2.7 g/dL — ABNORMAL LOW (ref 3.5–5.0)
Alkaline Phosphatase: 544 U/L — ABNORMAL HIGH (ref 38–126)
Anion gap: 10 (ref 5–15)
BUN: 9 mg/dL (ref 8–23)
CO2: 26 mmol/L (ref 22–32)
Calcium: 8.8 mg/dL — ABNORMAL LOW (ref 8.9–10.3)
Chloride: 95 mmol/L — ABNORMAL LOW (ref 98–111)
Creatinine, Ser: 0.4 mg/dL — ABNORMAL LOW (ref 0.44–1.00)
GFR, Estimated: >60 mL/min (ref >60)
Glucose, Bld: 100 mg/dL — ABNORMAL HIGH (ref 70–99)
Potassium: 3.7 mmol/L (ref 3.5–5.1)
Sodium: 131 mmol/L — ABNORMAL LOW (ref 135–145)
Total Bilirubin: 0.7 mg/dL (ref 0.3–1.2)
Total Protein: 6.6 g/dL (ref 6.5–8.1)

## 2021-07-08 MED ORDER — AMLODIPINE BESYLATE 5 MG PO TABS
5.0000 mg | ORAL_TABLET | Freq: Every day | ORAL | Status: DC
  Administered 2021-07-08 – 2021-07-09 (×2): 5 mg via ORAL
  Filled 2021-07-08 (×2): qty 1

## 2021-07-08 MED ORDER — BISACODYL 10 MG RE SUPP
10.0000 mg | Freq: Once | RECTAL | Status: AC
  Administered 2021-07-08: 10 mg via RECTAL
  Filled 2021-07-08: qty 1

## 2021-07-08 MED ORDER — POLYETHYLENE GLYCOL 3350 17 G PO PACK
17.0000 g | PACK | Freq: Every day | ORAL | Status: DC
  Administered 2021-07-08 – 2021-07-20 (×4): 17 g via ORAL
  Filled 2021-07-08 (×10): qty 1

## 2021-07-08 MED ORDER — HYDRALAZINE HCL 20 MG/ML IJ SOLN
10.0000 mg | Freq: Four times a day (QID) | INTRAMUSCULAR | Status: DC | PRN
  Administered 2021-07-08 (×2): 10 mg via INTRAVENOUS
  Filled 2021-07-08 (×2): qty 1

## 2021-07-08 MED ORDER — SENNA 8.6 MG PO TABS
1.0000 | ORAL_TABLET | Freq: Two times a day (BID) | ORAL | Status: DC
  Administered 2021-07-08 – 2021-07-09 (×4): 8.6 mg via ORAL
  Filled 2021-07-08 (×4): qty 1

## 2021-07-08 MED ORDER — ORAL CARE MOUTH RINSE
15.0000 mL | Freq: Two times a day (BID) | OROMUCOSAL | Status: DC
  Administered 2021-07-08 – 2021-07-24 (×19): 15 mL via OROMUCOSAL

## 2021-07-08 NOTE — Progress Notes (Addendum)
IR aware of KP request and has been following along given other cardiac issues. Cardiology notes reviewed, as they have signed off, no further plans.  Images reviewed with Dr. Tommie Sams, who feels pt is candidate for T1 and T6 vertebroplasty. We will need to submit for insurance approval and then schedule procedure accordingly. IR will continue to update and formally see pt.  Brayton El PA-C Interventional Radiology 07/08/2021 9:13 AM

## 2021-07-08 NOTE — Progress Notes (Signed)
°   07/08/21 0630  Vitals  BP (!) 175/76  MAP (mmHg) 105  BP Location Right Arm  BP Method Automatic  Patient Position (if appropriate) Lying  Pulse Rate 64  Pulse Rate Source Monitor  Resp 20  MEWS COLOR  MEWS Score Color Green  Oxygen Therapy  SpO2 99 %  O2 Device Nasal Cannula  O2 Flow Rate (L/min) 2 L/min  MEWS Score  MEWS Temp 0  MEWS Systolic 0  MEWS Pulse 0  MEWS RR 0  MEWS LOC 0  MEWS Score 0    Pt SBP remained in the 170's during the shift. Prn metoprolol adm x2 including scheduled dose ineffective. On-call MD paged and notified, no response. Pt stable and resting comfortably in bed with call light within reach. Reported off to oncoming RN. Dionne Bucy RN

## 2021-07-08 NOTE — Care Management Important Message (Signed)
Important Message  Patient Details  Name: Natasha Harper MRN: [id number] Date of Birth: 02/02/1935   Medicare Important Message Given:  Yes     Hannah Beat 07/08/2021, 12:38 PM

## 2021-07-08 NOTE — Progress Notes (Signed)
PROGRESS NOTE    Natasha Harper  ERD:[id number] DOB: [birth year] DOA: 07/04/2021 PCP: [name], MD   Chief Complain: Back pain, fall  Brief Narrative: Patient is a 87 year old female with history of hypothyroidism, hyperlipidemia, coronary artery disease, hypertension who presented with back pain and falls from home.  She fell at home and is unable to get up.  On presentation CT of the thoracic spine showed T1 and T6 compression fracture, lab work showed elevated troponin.  Started on heparin drip on admission now discontinued by cardiology because elevated troponin thought to be from demand ischemia.  Waiting for IR guided kyphoplasty.  Assessment & Plan:   Principal Problem:   Thoracic compression fracture (HCC) Active Problems:   Elevated troponin   Demand ischemia (HCC)   Primary hypertension   NSTEMI/history of coronary artery disease: Troponin was elevated troponin in the range of 2K but later trended was flat .  She has history of coronary artery disease status post PCI to RCA and diagonal in 1993 .  Has residual 70% LAD lesion. Echo done here showed left ventricular ejection fraction of 55-60%, normal regional wall motion.  Elevated troponin felt to be from demand ischemia.  Patient currently denies any chest pain.  Heparin drip discontinued.  No plans for cardiac cath.  Cardiology is following. No complaint of chest pain but she has sternal tenderness likely related to fall  Fall/compression fracture: CT thoracic spine showed T1, T6 compression fracture with 10 to 20% height loss with no retropulsion or spinal canal stenosis.  Continue pain management.  IR has been consulted for the consideration of kyphoplasty.  PT and OT  consulted   Hypertension: Currently hypertensive.  On losartan and metoprolol which have been continued.  Monitor blood pressure.  Continue as needed medications for severe hypertension.  Elevated blood pressure likely from pain.  We added amlodipine  5 mg daily  Hyperlipidemia: On Lipitor  Right breast abnormality: As seen on CTA: 2X 1.9 cm high-attenuation lesion in the right. She has breast lumps for years and was following with GYN, had mammograms, excisional biopsy of one of her breast was found to be benign.  She does not want further investigation.  If she wishes, outpatient mammogram/breast ultrasound recommended as outpatient to exclude neoplasm of breast  Elevated ALP: GGT elevated, AST, ALT mild elevated.  Abdominal ultrasound did not show any abnormality.  Monitor  Macrocytic anemia: Currently hemoglobin stable in the range of 9.  Iron studies showed normal iron.  Normal vitamin B12 and folic acid  Hyponatremia: Mild, stable, continue to monitor          DVT prophylaxis:Heparin [place] Code Status: Full Family Communication: Husband  at bed side Patient status:Inpatient  Dispo: The patient is from: Home              Anticipated d/c is to: Home              Anticipated d/c date is: Unsure at this point.  Waiting for kyphoplasty, PT/OT evaluation will determine disposition  Consultants: Cardiology, IR  Procedures: None  Antimicrobials:  Anti-infectives (From admission, onward)    None       Subjective:  Patient seen and examined at the bedside this morning.  Hemodynamically stable.  Lying in bed.  Husband at the bedside.  Complains of back pain when she moves.    Objective: Vitals:   07/08/21 0032 07/08/21 0416 07/08/21 0417 07/08/21 0630  BP: (!) 170/64 (!) 174/61  Marland Kitchen)  175/76  Pulse: 61 66  64  Resp: 19 19  20   Temp:  98.6 F (37 C)    TempSrc:  Oral    SpO2: 99% 98%  99%  Weight:   50 kg   Height:        Intake/Output Summary (Last 24 hours) at 07/08/2021 09/05/2021 Last data filed at 07/07/2021 2021 Gross per 24 hour  Intake --  Output 250 ml  Net -250 ml   Filed Weights   07/05/21 1010 07/07/21 0500 07/08/21 0417  Weight: 46 kg 46.3 kg 50 kg    Examination:  General exam: Pleasant elderly  female, deconditioned HEENT: PERRL Respiratory system:  no wheezes or crackles  Cardiovascular system: S1 & S2 heard, RRR.  Gastrointestinal system: Abdomen is nondistended, soft and nontender.  Sternal tenderness Central nervous system: Alert and oriented Extremities: No edema, no clubbing ,no cyanosis Skin: No rashes, no ulcers,no icterus       Data Reviewed: I have personally reviewed following labs and imaging studies  CBC: Recent Labs  Lab 07/05/21 0102 07/06/21 0308 07/06/21 1618  WBC 8.1 7.1 6.1  NEUTROABS 7.1  --   --   HGB 9.8* 9.1* 9.5*  HCT 29.4* 27.4* 27.6*  MCV 103.9* 101.9* 102.2*  PLT 362 328 313   Basic Metabolic Panel: Recent Labs  Lab 07/05/21 0244 07/06/21 1008 07/07/21 0326  NA 131*  --  132*  K 4.4  --  3.6  CL 100  --  98  CO2 23  --  25  GLUCOSE 117*  --  97  BUN 12  --  11  CREATININE 0.31* 1.13* 0.49  CALCIUM 8.8*  --  8.8*   GFR: Estimated Creatinine Clearance: 39.5 mL/min (by C-G formula based on SCr of 0.49 mg/dL). Liver Function Tests: Recent Labs  Lab 07/05/21 0244  AST 67*  ALT 52*  ALKPHOS 606*  BILITOT 0.9  PROT 6.9  ALBUMIN 3.2*   No results for input(s): LIPASE, AMYLASE in the last 168 hours. No results for input(s): AMMONIA in the last 168 hours. Coagulation Profile: Recent Labs  Lab 07/05/21 0315  INR 1.1   Cardiac Enzymes: Recent Labs  Lab 07/05/21 1018  CKTOTAL 283*   BNP (last 3 results) No results for input(s): PROBNP in the last 8760 hours. HbA1C: No results for input(s): HGBA1C in the last 72 hours. CBG: No results for input(s): GLUCAP in the last 168 hours. Lipid Profile: Recent Labs    07/06/21 0308  CHOL 181  HDL 56  LDLCALC 116*  TRIG 47  CHOLHDL 3.2   Thyroid Function Tests: No results for input(s): TSH, T4TOTAL, FREET4, T3FREE, THYROIDAB in the last 72 hours. Anemia Panel: Recent Labs    07/05/21 1018 07/06/21 1008  VITAMINB12 474  --   FOLATE 34.8  --   FERRITIN  --  67  TIBC   --  351  IRON  --  47   Sepsis Labs: No results for input(s): PROCALCITON, LATICACIDVEN in the last 168 hours.  Recent Results (from the past 240 hour(s))  Resp Panel by RT-PCR (Flu A&B, Covid) Nasopharyngeal Swab     Status: None   Collection Time: 07/05/21  3:02 AM   Specimen: Nasopharyngeal Swab; Nasopharyngeal(NP) swabs in vial transport medium  Result Value Ref Range Status   SARS Coronavirus 2 by RT PCR NEGATIVE NEGATIVE Final    Comment: (NOTE) SARS-CoV-2 target nucleic acids are NOT DETECTED.  The SARS-CoV-2 RNA is generally detectable in  upper respiratory specimens during the acute phase of infection. The lowest concentration of SARS-CoV-2 viral copies this assay can detect is 138 copies/mL. A negative result does not preclude SARS-Cov-2 infection and should not be used as the sole basis for treatment or other patient management decisions. A negative result may occur with  improper specimen collection/handling, submission of specimen other than nasopharyngeal swab, presence of viral mutation(s) within the areas targeted by this assay, and inadequate number of viral copies(<138 copies/mL). A negative result must be combined with clinical observations, patient history, and epidemiological information. The expected result is Negative.  Fact Sheet for Patients:  BloggerCourse.comhttps://www.fda.gov/media/152166/download  Fact Sheet for Healthcare Providers:  SeriousBroker.ithttps://www.fda.gov/media/152162/download  This test is no t yet approved or cleared by the [place] FDA and  has been authorized for detection and/or diagnosis of SARS-CoV-2 by FDA under an Emergency Use Authorization (EUA). This EUA will remain  in effect (meaning this test can be used) for the duration of the COVID-19 declaration under Section 564(b)(1) of the Act, 21 U.S.C.section 360bbb-3(b)(1), unless the authorization is terminated  or revoked sooner.       Influenza A by PCR NEGATIVE NEGATIVE Final   Influenza B by  PCR NEGATIVE NEGATIVE Final    Comment: (NOTE) The Xpert Xpress SARS-CoV-2/FLU/RSV plus assay is intended as an aid in the diagnosis of influenza from Nasopharyngeal swab specimens and should not be used as a sole basis for treatment. Nasal washings and aspirates are unacceptable for Xpert Xpress SARS-CoV-2/FLU/RSV testing.  Fact Sheet for Patients: BloggerCourse.comhttps://www.fda.gov/media/152166/download  Fact Sheet for Healthcare Providers: SeriousBroker.ithttps://www.fda.gov/media/152162/download  This test is not yet approved or cleared by the [place] FDA and has been authorized for detection and/or diagnosis of SARS-CoV-2 by FDA under an Emergency Use Authorization (EUA). This EUA will remain in effect (meaning this test can be used) for the duration of the COVID-19 declaration under Section 564(b)(1) of the Act, 21 U.S.C. section 360bbb-3(b)(1), unless the authorization is terminated or revoked.  Performed at [facility], [address] [place], [place] [address]   MRSA Next Gen by PCR, Nasal     Status: None   Collection Time: 07/05/21  4:45 PM   Specimen: Nasal Mucosa; Nasal Swab  Result Value Ref Range Status   MRSA by PCR Next Gen NOT DETECTED NOT DETECTED Final    Comment: (NOTE) The GeneXpert MRSA Assay (FDA approved for NASAL specimens only), is one component of a comprehensive MRSA colonization surveillance program. It is not intended to diagnose MRSA infection nor to guide or monitor treatment for MRSA infections. Test performance is not FDA approved in patients less than 781 years old. Performed at [facility], [address] [place], [place] [address]          Radiology Studies: No results found.      Scheduled Meds:  amLODipine  5 mg Oral Daily   aspirin EC  81 mg Oral Daily   atorvastatin  80 mg Oral Daily   heparin  5,000 Units Subcutaneous Q8H   losartan  50 mg Oral Daily   mouth rinse  15 mL Mouth Rinse BID   metoprolol tartrate   12.5 mg Oral BID   Continuous Infusions:   LOS: 3 days    Time spent:25 mins. More than 50% of that time was spent in counseling and/or coordination of care.      Burnadette PopAmrit Kapil Petropoulos, MD [facility] P1/08/2021, 7:38 AM

## 2021-07-09 DIAGNOSIS — S22050A Wedge compression fracture of T5-T6 vertebra, initial encounter for closed fracture: Secondary | ICD-10-CM | POA: Diagnosis not present

## 2021-07-09 NOTE — Progress Notes (Addendum)
PROGRESS NOTE    Natasha Harper  [id number] DOB: [birth year] DOA: 07/04/2021 PCP: Burnis Medin, MD   Chief Complain: Back pain, fall  Brief Narrative: Patient is a 87 year old female with history of hypothyroidism, hyperlipidemia, coronary artery disease, hypertension who presented with back pain and falls from home.  She fell at home and is unable to get up.  On presentation CT of the thoracic spine showed T1 and T6 compression fracture, lab work showed elevated troponin.  Started on heparin drip on admission now discontinued by cardiology because elevated troponin thought to be from demand ischemia.  Waiting for IR guided kyphoplasty.  PT/OT will be consulted after kyphoplasty.  Assessment & Plan:   Principal Problem:   Thoracic compression fracture (HCC) Active Problems:   Elevated troponin   Demand ischemia (HCC)   Primary hypertension   Fall/compression fracture: CT thoracic spine showed T1, T6 compression fracture with 10 to 20% height loss with no retropulsion or spinal canal stenosis.  Continue pain management.  IR has been consulted for the consideration of kyphoplasty. They are checking for insurance approval , so the procedure plan has been delayed. PT and OT will be consulted after kyphoplasty.  NSTEMI/history of coronary artery disease: Troponin was elevated troponin in the range of 2K but flat trended  She has history of coronary artery disease status post PCI to RCA and diagonal in 1993 .  Has residual 70% LAD lesion. Echo done here showed left ventricular ejection fraction of 55-60%, normal regional wall motion.  Elevated troponin felt to be from demand ischemia.  Patient currently denies any chest pain.  Heparin drip discontinued.  No plans for cardiac cath.  Cardiology was following. No complaint of chest pain but she has sternal tenderness likely related to fall  Hypertension: BP fulctuating.  On losartan and metoprolol which have been continued.  Monitor blood  pressure.  Continue as needed medications for severe hypertension.  Elevated blood pressure likely from pain. Added amlodipine 10 mg daily   Hyperlipidemia: On Lipitor   Right breast abnormal imaging: As seen on CTA: 2X 1.9 cm high-attenuation lesion in the right. She has breast lumps for years and was following with GYN, had mammograms, excisional biopsy of one of her breast was found to be benign.  She does not want further investigation.  If she wishes, outpatient mammogram/breast ultrasound recommended as outpatient to exclude neoplasm of breast   Elevated ALP: GGT elevated, ALP elevated.  Abdominal ultrasound did not show any abnormality.  Could be associated with pathology with bones with possible underlying breast cancer.  Can be monitored as an outpatient.   Macrocytic anemia: Currently hemoglobin stable in the range of 9.  Iron studies showed normal iron.  Normal vitamin [id number] and folic acid   Hyponatremia: Mild, stable, continue to monitor           DVT prophylaxis:Heparin [place] Code Status: Full Family Communication: Husband  at bed side Patient status:Inpatient  Dispo: The patient is from: Home              Anticipated d/c is to: Home              Anticipated d/c date is: Unsure at this point.  Waiting for kyphoplasty, PT/OT evaluation will determine disposition  Consultants: Cardiology, IR  Procedures: None  Antimicrobials:  Anti-infectives (From admission, onward)    None       Subjective:  Patient seen and examined at the bedside this morning.  Hemodynamically stable.  Comfortable.  Complains of back pain when she moves.  Husband at the bedside    Objective: Vitals:   07/08/21 1958 07/08/21 2110 07/08/21 2306 07/09/21 0302  BP: (!) 133/52 (!) 144/60 (!) 153/60 (!) 177/68  Pulse: 84 85 75 78  Resp: 20  (!) 23 20  Temp: 98.6 F (37 C)  98.6 F (37 C) 99.8 F (37.7 C)  TempSrc: Oral  Oral Oral  SpO2:   91% 95%  Weight:    48 kg  Height:         Intake/Output Summary (Last 24 hours) at 07/09/2021 0725 Last data filed at 07/09/2021 [id number] Gross per 24 hour  Intake 360 ml  Output 300 ml  Net 60 ml   Filed Weights   07/07/21 0500 07/08/21 0417 07/09/21 0302  Weight: 46.3 kg 50 kg 48 kg    Examination:    General exam: Overall comfortable, not in distress, very deconditioned, frail HEENT: PERRL Respiratory system:  no wheezes or crackles  Cardiovascular system: S1 & S2 heard, RRR.  Gastrointestinal system: Abdomen is nondistended, soft and nontender. Central nervous system: Alert and oriented Extremities: No edema, no clubbing ,no cyanosis Skin: No rashes, no ulcers,no icterus      Data Reviewed: I have personally reviewed following labs and imaging studies  CBC: Recent Labs  Lab 07/05/21 0102 07/06/21 0308 07/06/21 1618  WBC 8.1 7.1 6.1  NEUTROABS 7.1  --   --   HGB 9.8* 9.1* 9.5*  HCT 29.4* 27.4* 27.6*  MCV 103.9* 101.9* 102.2*  PLT 362 328 [id number]   Basic Metabolic Panel: Recent Labs  Lab 07/05/21 0244 07/06/21 1008 07/07/21 0326 07/08/21 0721  NA 131*  --  132* 131*  K 4.4  --  3.6 3.7  CL 100  --  98 95*  CO2 23  --  25 26  GLUCOSE 117*  --  97 100*  BUN 12  --  11 9  CREATININE 0.31* 1.13* 0.49 0.40*  CALCIUM 8.8*  --  8.8* 8.8*   GFR: Estimated Creatinine Clearance: 39.5 mL/min (A) (by C-G formula based on SCr of 0.4 mg/dL (L)). Liver Function Tests: Recent Labs  Lab 07/05/21 0244 07/08/21 0721  AST 67* 44*  ALT 52* 40  ALKPHOS 606* 544*  BILITOT 0.9 0.7  PROT 6.9 6.6  ALBUMIN 3.2* 2.7*   No results for input(s): LIPASE, AMYLASE in the last 168 hours. No results for input(s): AMMONIA in the last 168 hours. Coagulation Profile: Recent Labs  Lab 07/05/21 0315  INR 1.1   Cardiac Enzymes: Recent Labs  Lab 07/05/21 1018  CKTOTAL 283*   BNP (last 3 results) No results for input(s): PROBNP in the last 8760 hours. HbA1C: No results for input(s): HGBA1C in the last 72  hours. CBG: No results for input(s): GLUCAP in the last 168 hours. Lipid Profile: No results for input(s): CHOL, HDL, LDLCALC, TRIG, CHOLHDL, LDLDIRECT in the last 72 hours.  Thyroid Function Tests: No results for input(s): TSH, T4TOTAL, FREET4, T3FREE, THYROIDAB in the last 72 hours. Anemia Panel: Recent Labs    07/06/21 1008  FERRITIN 67  TIBC 351  IRON 47   Sepsis Labs: No results for input(s): PROCALCITON, LATICACIDVEN in the last 168 hours.  Recent Results (from the past 240 hour(s))  Resp Panel by RT-PCR (Flu A&B, Covid) Nasopharyngeal Swab     Status: None   Collection Time: 07/05/21  3:02 AM   Specimen: Nasopharyngeal Swab; Nasopharyngeal(NP) swabs in vial transport medium  Result Value Ref Range Status   SARS Coronavirus 2 by RT PCR NEGATIVE NEGATIVE Final    Comment: (NOTE) SARS-CoV-2 target nucleic acids are NOT DETECTED.  The SARS-CoV-2 RNA is generally detectable in upper respiratory specimens during the acute phase of infection. The lowest concentration of SARS-CoV-2 viral copies this assay can detect is 138 copies/mL. A negative result does not preclude SARS-Cov-2 infection and should not be used as the sole basis for treatment or other patient management decisions. A negative result may occur with  improper specimen collection/handling, submission of specimen other than nasopharyngeal swab, presence of viral mutation(s) within the areas targeted by this assay, and inadequate number of viral copies(<138 copies/mL). A negative result must be combined with clinical observations, patient history, and epidemiological information. The expected result is Negative.  Fact Sheet for Patients:  BloggerCourse.com  Fact Sheet for Healthcare Providers:  SeriousBroker.it  This test is no t yet approved or cleared by the [place] FDA and  has been authorized for detection and/or diagnosis of SARS-CoV-2 by FDA under  an Emergency Use Authorization (EUA). This EUA will remain  in effect (meaning this test can be used) for the duration of the COVID-19 declaration under Section 564(b)(1) of the Act, 21 U.S.C.section 360bbb-3(b)(1), unless the authorization is terminated  or revoked sooner.       Influenza A by PCR NEGATIVE NEGATIVE Final   Influenza B by PCR NEGATIVE NEGATIVE Final    Comment: (NOTE) The Xpert Xpress SARS-CoV-2/FLU/RSV plus assay is intended as an aid in the diagnosis of influenza from Nasopharyngeal swab specimens and should not be used as a sole basis for treatment. Nasal washings and aspirates are unacceptable for Xpert Xpress SARS-CoV-2/FLU/RSV testing.  Fact Sheet for Patients: BloggerCourse.com  Fact Sheet for Healthcare Providers: SeriousBroker.it  This test is not yet approved or cleared by the [place] FDA and has been authorized for detection and/or diagnosis of SARS-CoV-2 by FDA under an Emergency Use Authorization (EUA). This EUA will remain in effect (meaning this test can be used) for the duration of the COVID-19 declaration under Section 564(b)(1) of the Act, 21 U.S.C. section 360bbb-3(b)(1), unless the authorization is terminated or revoked.  Performed at [facility], [address], [place], [place] [address]   MRSA Next Gen by PCR, Nasal     Status: None   Collection Time: 07/05/21  4:45 PM   Specimen: Nasal Mucosa; Nasal Swab  Result Value Ref Range Status   MRSA by PCR Next Gen NOT DETECTED NOT DETECTED Final    Comment: (NOTE) The GeneXpert MRSA Assay (FDA approved for NASAL specimens only), is one component of a comprehensive MRSA colonization surveillance program. It is not intended to diagnose MRSA infection nor to guide or monitor treatment for MRSA infections. Test performance is not FDA approved in patients less than 29 years old. Performed at [facility],  [address] [place], [place] [address]          Radiology Studies: No results found.      Scheduled Meds:  amLODipine  5 mg Oral Daily   aspirin EC  81 mg Oral Daily   atorvastatin  80 mg Oral Daily   heparin  5,000 Units Subcutaneous Q8H   losartan  50 mg Oral Daily   mouth rinse  15 mL Mouth Rinse BID   metoprolol tartrate  12.5 mg Oral BID   polyethylene glycol  17 g Oral Daily   senna  1 tablet  Oral BID   Continuous Infusions:   LOS: 4 days    Time spent:25 mins. More than 50% of that time was spent in counseling and/or coordination of care.      Shelly Coss, MD [facility] P1/09/2021, 7:25 AM

## 2021-07-10 DIAGNOSIS — S22050A Wedge compression fracture of T5-T6 vertebra, initial encounter for closed fracture: Secondary | ICD-10-CM | POA: Diagnosis not present

## 2021-07-10 MED ORDER — CEFAZOLIN SODIUM-DEXTROSE 2-4 GM/100ML-% IV SOLN: 2.0000 g | INTRAVENOUS | Status: AC

## 2021-07-10 MED ORDER — AMLODIPINE BESYLATE 10 MG PO TABS
10.0000 mg | ORAL_TABLET | Freq: Every day | ORAL | Status: DC
  Administered 2021-07-10 – 2021-07-24 (×15): 10 mg via ORAL
  Filled 2021-07-10 (×15): qty 1

## 2021-07-10 NOTE — Progress Notes (Addendum)
Chief Complaint: Patient was seen in consultation today for thoracic compression fractures  Referring Physician: Dr. Margie Ege  Supervising Physician: Baldemar Lenis  Patient Status: [facility] - In-pt  History of Present Illness: Natasha Harper is a 87 y.o. female with hx of falls. She's had prior falls and imaging showing thoracic and lumbar compression fractures. She did not require any intervention for those. She suffered another fall several days ago and came to the [facility] ER. Her workup showed new compression fractures at the T1 and T6 levels. She c/o substernal chest pain upon admission as well as moderate to severe back pain and her cardiac enzymes were elevated. There was concern for NSTEMI and she was transferred to [facility] after evaluation by cardiology. After a few days of observation and heparin gtt, she required no further intervention and has essentially been cleared by cardiology. She is no longer on a heparin gtt, just subq prophylactic heparin. She reports continued moderate pain in her upper back that does seem to radiate around to the front as well. She rates her pain 6-7/10 and is currently requiring narcotic pain medication for relief. IR is asked to eval for possible kyphoplasty. Case and imaging reviewed with Dr. Tommie Sams, who feels pt would be good candidate for vertebroplasty. PMHx, meds, labs, imaging, allergies reviewed. Feels okay otherwise. Husband at bedside.   Past Medical History:  Diagnosis Date   CAD (coronary artery disease)    PCI to RCA and diagonal in remote past, residual 70% LAD  /   nuclear, 2007, no ischemia   Carotid arterial disease (HCC)    Doppler, June, 2011, stable, 60-79% R. ICA, 40-59% LICA   Contact lens/glasses fitting    wears contacts or glasses   Dyslipidemia    Significant drop in LDL from Lipitor even though LDL remains high   Ejection fraction    EF 65%, nuclear, 2007   Fibrocystic breast    HTN  (hypertension)     no med   Hypercholesterolemia    Hypothyroidism    Thyroid surgery in the past, thyroid nodules followed by Dr.Ellison   Lump or mass in breast 07/19/2007   Excised 01/12/13. B9 on pathQualifier: Diagnosis of  By: Fabian Sharp MD, Neta Mends     Osteoporosis    Prominent abdominal aortic pulsation    No abdominal aneurysm by ultrasound   Rectal fissure     Past Surgical History:  Procedure Laterality Date   BREAST BIOPSY Right 01/12/2013   Procedure: Removal of right breast mass;  Surgeon: Currie Paris, MD;  Location: [facility];  Service: General;  Laterality: Right;   BREAST EXCISIONAL BIOPSY  9/14   scar tissue from the needle biopsy   CORONARY ANGIOPLASTY  1993   RECTOPERITONEAL FISTULA CLOSURE     THYROID CYST EXCISION      Allergies: Actonel [risedronate sodium], Evista [raloxifene], and Fosamax [alendronate sodium]  Medications:  Current Facility-Administered Medications:    acetaminophen (TYLENOL) tablet 650 mg, 650 mg, Oral, Q6H PRN, 650 mg at 07/08/21 1258 **OR** acetaminophen (TYLENOL) suppository 650 mg, 650 mg, Rectal, Q6H PRN, Ronaldo Miyamoto, Tyrone A, DO   amLODipine (NORVASC) tablet 10 mg, 10 mg, Oral, Daily, Adhikari, Amrit, MD, 10 mg at 07/10/21 0957   aspirin EC tablet 81 mg, 81 mg, Oral, Daily, Kyle, Tyrone A, DO, 81 mg at 07/10/21 0956   atorvastatin (LIPITOR) tablet 80 mg, 80 mg, Oral, Daily, Kyle, Tyrone A, DO, 80 mg at 07/10/21  1610   fentaNYL (SUBLIMAZE) injection 50 mcg, 50 mcg, Intravenous, Q2H PRN, Ronaldo Miyamoto, Tyrone A, DO, 50 mcg at 07/05/21 1605   heparin injection 5,000 Units, 5,000 Units, Subcutaneous, Q8H, Hilty, Lisette Abu, MD, 5,000 Units at 07/10/21 9604   hydrALAZINE (APRESOLINE) injection 10 mg, 10 mg, Intravenous, Q6H PRN, Burnadette Pop, MD, 10 mg at 07/08/21 1847   losartan (COZAAR) tablet 50 mg, 50 mg, Oral, Daily, Nahser, Deloris Ping, MD, 50 mg at 07/10/21 5409   MEDLINE mouth rinse, 15 mL, Mouth Rinse, BID, Adhikari, Amrit, MD,  15 mL at 07/09/21 2129   metoprolol tartrate (LOPRESSOR) injection 5 mg, 5 mg, Intravenous, Q6H PRN, Ronaldo Miyamoto, Tyrone A, DO, 5 mg at 07/08/21 0416   metoprolol tartrate (LOPRESSOR) tablet 12.5 mg, 12.5 mg, Oral, BID, Bhagat, Bhavinkumar, PA, 12.5 mg at 07/10/21 0956   naloxone (NARCAN) injection 0.4 mg, 0.4 mg, Intravenous, PRN, Ronaldo Miyamoto, Tyrone A, DO   ondansetron (ZOFRAN) injection 4 mg, 4 mg, Intravenous, Q6H PRN, Ronaldo Miyamoto, Tyrone A, DO   polyethylene glycol (MIRALAX / GLYCOLAX) packet 17 g, 17 g, Oral, Daily, Adhikari, Amrit, MD, 17 g at 07/09/21 0813    Family History  Problem Relation Age of Onset   Heart attack Father    Osteoporosis Mother    Breast cancer Daughter     Social History   Socioeconomic History   Marital status: Married    Spouse name: Not on file   Number of children: Not on file   Years of education: Not on file   Highest education level: Not on file  Occupational History   Not on file  Tobacco Use   Smoking status: Former    Types: Cigarettes    Quit date: 07/07/1958    Years since quitting: 63.0   Smokeless tobacco: Never  Substance and Sexual Activity   Alcohol use: No   Drug use: No   Sexual activity: Yes    Partners: Male  Other Topics Concern   Not on file  Social History Narrative   HH     Of 2  No pets    Family from [place]    Exercise   Walks every day at least 30 minutes  2 miles .   Sleep  Ok 10 - 7    Just moved to [place] [facility]   Financial Resource Strain: Not on [employer] Insecurity: Not on file  Transportation Needs: Not on file  Physical Activity: Not on file  Stress: Not on file  Social Connections: Not on file    Review of Systems: A 12 point ROS discussed and pertinent positives are indicated in the HPI above.  All other systems are negative.  Review of Systems  Vital Signs: BP (!) 164/71 (BP Location: Right Arm)    Pulse 79    Temp 98.6 F (37 C) (Oral)    Resp 19    Ht 5\' 1"  (1.549 m)    Wt 48  kg    LMP 07/07/1997    SpO2 92%    BMI 19.99 kg/m   Physical Exam Constitutional:      General: She is not in acute distress.    Appearance: Normal appearance. She is not ill-appearing.  HENT:     Mouth/Throat:     Mouth: Mucous membranes are moist.     Pharynx: Oropharynx is clear.  Cardiovascular:     Rate and Rhythm: Normal rate and regular rhythm.  Heart sounds: Normal heart sounds.  Pulmonary:     Effort: Pulmonary effort is normal. No respiratory distress.     Breath sounds: Normal breath sounds.  Musculoskeletal:     Comments: Mildly tender about the upper and mid thoracic spine region. No palpable defects or overlying skin changes.  Neurological:     General: No focal deficit present.     Mental Status: She is alert and oriented to person, place, and time.  Psychiatric:        Mood and Affect: Mood normal.        Thought Content: Thought content normal.      Imaging: DG Chest 2 View  Result Date: 07/05/2021 CLINICAL DATA:  Chest pain.  Fall. EXAM: CHEST - 2 VIEW COMPARISON:  None. FINDINGS: Left lung base density, likely atelectasis or infiltrate. Trace left pleural effusion may be present. No pneumothorax. There is cardiomegaly with vascular congestion. Atherosclerotic calcification of the aorta. Osteopenia with degenerative changes of the spine. Multilevel age indeterminate compression fractures. IMPRESSION: 1. Left lung base atelectasis versus infiltrate. 2. Cardiomegaly with vascular congestion. 3. Multilevel age indeterminate compression fractures. Electronically Signed   By: Elgie CollardArash  Radparvar M.D.   On: 07/05/2021 00:40   DG Thoracic Spine 2 View  Result Date: 07/05/2021 CLINICAL DATA:  Low back pain after 2 falls today. Limited movement. EXAM: THORACIC SPINE 2 VIEWS COMPARISON:  CT thoracic spine 04/13/2021 FINDINGS: Normal alignment of the thoracic spine. Diffuse bone demineralization. Multiple vertebral compression fractures demonstrated at T12, T11, T10,  T9, T8, T7, and T6 levels. Mild endplate compression at T5 and T4. Similar appearance to previous study. No definite new fractures. No abnormal paraspinal soft tissue swelling. Due to bone demineralization, CT would be more sensitive for evaluation of possible progression if clinically indicated. IMPRESSION: Diffuse bone demineralization with multiple thoracic vertebral compression fractures, similar to prior study. Changes likely represent osteoporosis. Electronically Signed   By: Burman NievesWilliam  Stevens M.D.   On: 07/05/2021 00:00   DG Lumbar Spine Complete  Result Date: 07/04/2021 CLINICAL DATA:  Back pain.  Two falls today. EXAM: LUMBAR SPINE - COMPLETE 4+ VIEW COMPARISON:  CT 04/13/2021 FINDINGS: Diffuse bone demineralization. Five lumbar type vertebral bodies. Compression fractures again demonstrated at L1 without change. Mild superior endplate compression demonstrated at L2, L4, and L5, similar to prior study. Alignment is unchanged. Overlying bowel gas limits visualization of the sacrum. Prominent vascular calcifications. IMPRESSION: Diffuse bone demineralization with multiple compression fractures, similar to prior study. Changes likely due to osteoporosis. No definite acute fractures. Electronically Signed   By: Burman NievesWilliam  Stevens M.D.   On: 07/04/2021 23:58   CT Head Wo Contrast  Result Date: 07/05/2021 CLINICAL DATA:  Multiple falls. EXAM: CT HEAD WITHOUT CONTRAST TECHNIQUE: Contiguous axial images were obtained from the base of the skull through the vertex without intravenous contrast. COMPARISON:  None. FINDINGS: Brain: There is moderate severity cerebral atrophy with widening of the extra-axial spaces and ventricular dilatation. There are areas of decreased attenuation within the white matter tracts of the supratentorial brain, consistent with microvascular disease changes. Vascular: No hyperdense vessel or unexpected calcification. Skull: Negative for acute fracture. A 9 mm x 4 mm benign-appearing  area of focal cortical thickening is seen within left frontal region. Sinuses/Orbits: No acute finding. Other: None. IMPRESSION: 1. Generalized cerebral atrophy and microvascular disease changes of the supratentorial brain. 2. No acute intracranial abnormality. Electronically Signed   By: Aram Candelahaddeus  Houston M.D.   On: 07/05/2021 02:30   CT Angio  Chest PE W and/or Wo Contrast  Result Date: 07/05/2021 CLINICAL DATA:  87 year old female with clinical high suspicion of pulmonary embolism. EXAM: CT ANGIOGRAPHY CHEST WITH CONTRAST TECHNIQUE: Multidetector CT imaging of the chest was performed using the standard protocol during bolus administration of intravenous contrast. Multiplanar CT image reconstructions and MIPs were obtained to evaluate the vascular anatomy. CONTRAST:  70mL OMNIPAQUE IOHEXOL 350 MG/ML SOLN COMPARISON:  No priors. FINDINGS: Cardiovascular: No filling defects are noted in the pulmonary arterial tree to suggest pulmonary embolism. Heart size is normal. There is no significant pericardial fluid, thickening or pericardial calcification. There is aortic atherosclerosis, as well as atherosclerosis of the great vessels of the mediastinum and the coronary arteries, including calcified atherosclerotic plaque in the left main, left anterior descending and right coronary arteries. Mediastinum/Nodes: No pathologically enlarged mediastinal or hilar lymph nodes. Hilar esophagus is unremarkable in appearance. No axillary lymphadenopathy. Lungs/Pleura: No acute consolidative airspace disease. No pleural effusions. Dependent areas of subsegmental atelectasis lying in the lower lobes of the lungs bilaterally. No definite suspicious appearing pulmonary nodules or masses are noted. Upper Abdomen: Aortic atherosclerosis. Musculoskeletal: In the right breast (axial image 88 of series 4) there is a 2.0 x 1.9 cm high attenuation (83 HU) lesion. Multiple chronic appearing compression fractures are noted in the thoracic  spine involving the superior endplate of T1, T5, superior endplate of T6, T7, T8, T9, T10 and T11, most severe at T11 where there is 70% loss of anterior vertebral body height. There are no aggressive appearing lytic or blastic lesions noted in the visualized portions of the skeleton. Review of the MIP images confirms the above findings. IMPRESSION: 1. No evidence of pulmonary embolism. 2. Widespread dependent atelectasis in the lower lobes of the lungs bilaterally. 3. Aortic atherosclerosis, in addition to left main and 2 vessel coronary artery disease. 4. 2.0 x 1.9 cm high attenuation lesion in the right breast. This is nonspecific, but warrants further evaluation with follow-up mammography and/or breast ultrasound in the near future to exclude neoplasm. Aortic Atherosclerosis (ICD10-I70.0). Electronically Signed   By: Trudie Reed M.D.   On: 07/05/2021 07:01   CT Cervical Spine Wo Contrast  Result Date: 07/05/2021 CLINICAL DATA:  Trauma EXAM: CT CERVICAL, THORACIC, AND LUMBAR SPINE WITHOUT CONTRAST TECHNIQUE: Multidetector CT imaging of the cervical, thoracic and lumbar spine was performed without intravenous contrast. Multiplanar CT image reconstructions were also generated. COMPARISON:  04/13/2021 FINDINGS: CT CERVICAL SPINE FINDINGS Alignment: Normal. Skull base and vertebrae: No acute fracture. No primary bone lesion or focal pathologic process. Soft tissues and spinal canal: No prevertebral fluid or swelling. No visible canal hematoma. Disc levels:  No spinal canal stenosis. CT THORACIC SPINE FINDINGS Alignment: Normal. Vertebrae: There is a wedge compression fracture of T1 with less than 20% loss, involving the anterior wall and superior endplate. No retropulsion. There is also a fracture at T6 with approximately 25% height loss that is new since the prior study. The fracture involves the anterior and posterior walls and the superior endplate. Lower thoracic compression deformities are unchanged.  Paraspinal and other soft tissues: Calcific aortic atherosclerosis Disc levels: No spinal canal stenosis CT LUMBAR SPINE FINDINGS Segmentation: 5 lumbar type vertebrae. Alignment: Grade 1 anterolisthesis at L4-5 Vertebrae: Chronic compression deformity of L1. Paraspinal and other soft tissues: Calcific aortic atherosclerosis. Disc levels: No spinal canal stenosis. IMPRESSION: 1. T1 and T6 compression fractures with 10-20% height loss, new since the prior study. No retropulsion or spinal canal stenosis. 2. Multiple other chronic  compression deformities of the thoracic and lumbar spine. 3. No acute fracture or static subluxation of the cervical spine. Aortic Atherosclerosis (ICD10-I70.0). Electronically Signed   By: Deatra Robinson M.D.   On: 07/05/2021 01:16   CT Thoracic Spine Wo Contrast  Result Date: 07/05/2021 CLINICAL DATA:  Trauma EXAM: CT CERVICAL, THORACIC, AND LUMBAR SPINE WITHOUT CONTRAST TECHNIQUE: Multidetector CT imaging of the cervical, thoracic and lumbar spine was performed without intravenous contrast. Multiplanar CT image reconstructions were also generated. COMPARISON:  04/13/2021 FINDINGS: CT CERVICAL SPINE FINDINGS Alignment: Normal. Skull base and vertebrae: No acute fracture. No primary bone lesion or focal pathologic process. Soft tissues and spinal canal: No prevertebral fluid or swelling. No visible canal hematoma. Disc levels:  No spinal canal stenosis. CT THORACIC SPINE FINDINGS Alignment: Normal. Vertebrae: There is a wedge compression fracture of T1 with less than 20% loss, involving the anterior wall and superior endplate. No retropulsion. There is also a fracture at T6 with approximately 25% height loss that is new since the prior study. The fracture involves the anterior and posterior walls and the superior endplate. Lower thoracic compression deformities are unchanged. Paraspinal and other soft tissues: Calcific aortic atherosclerosis Disc levels: No spinal canal stenosis CT  LUMBAR SPINE FINDINGS Segmentation: 5 lumbar type vertebrae. Alignment: Grade 1 anterolisthesis at L4-5 Vertebrae: Chronic compression deformity of L1. Paraspinal and other soft tissues: Calcific aortic atherosclerosis. Disc levels: No spinal canal stenosis. IMPRESSION: 1. T1 and T6 compression fractures with 10-20% height loss, new since the prior study. No retropulsion or spinal canal stenosis. 2. Multiple other chronic compression deformities of the thoracic and lumbar spine. 3. No acute fracture or static subluxation of the cervical spine. Aortic Atherosclerosis (ICD10-I70.0). Electronically Signed   By: Deatra Robinson M.D.   On: 07/05/2021 01:16   CT Lumbar Spine Wo Contrast  Result Date: 07/05/2021 CLINICAL DATA:  Trauma EXAM: CT CERVICAL, THORACIC, AND LUMBAR SPINE WITHOUT CONTRAST TECHNIQUE: Multidetector CT imaging of the cervical, thoracic and lumbar spine was performed without intravenous contrast. Multiplanar CT image reconstructions were also generated. COMPARISON:  04/13/2021 FINDINGS: CT CERVICAL SPINE FINDINGS Alignment: Normal. Skull base and vertebrae: No acute fracture. No primary bone lesion or focal pathologic process. Soft tissues and spinal canal: No prevertebral fluid or swelling. No visible canal hematoma. Disc levels:  No spinal canal stenosis. CT THORACIC SPINE FINDINGS Alignment: Normal. Vertebrae: There is a wedge compression fracture of T1 with less than 20% loss, involving the anterior wall and superior endplate. No retropulsion. There is also a fracture at T6 with approximately 25% height loss that is new since the prior study. The fracture involves the anterior and posterior walls and the superior endplate. Lower thoracic compression deformities are unchanged. Paraspinal and other soft tissues: Calcific aortic atherosclerosis Disc levels: No spinal canal stenosis CT LUMBAR SPINE FINDINGS Segmentation: 5 lumbar type vertebrae. Alignment: Grade 1 anterolisthesis at L4-5 Vertebrae:  Chronic compression deformity of L1. Paraspinal and other soft tissues: Calcific aortic atherosclerosis. Disc levels: No spinal canal stenosis. IMPRESSION: 1. T1 and T6 compression fractures with 10-20% height loss, new since the prior study. No retropulsion or spinal canal stenosis. 2. Multiple other chronic compression deformities of the thoracic and lumbar spine. 3. No acute fracture or static subluxation of the cervical spine. Aortic Atherosclerosis (ICD10-I70.0). Electronically Signed   By: Deatra Robinson M.D.   On: 07/05/2021 01:16   ECHOCARDIOGRAM COMPLETE  Result Date: 07/05/2021    ECHOCARDIOGRAM REPORT   Patient Name:   PRINCES FINGER [facility]  Date of Exam: 07/05/2021 Medical Rec #:  [id number]       Height:       61.0 in Accession #:    [id number]      Weight:       101.4 lb Date of Birth:  April 14, 1935      BSA:          1.415 m Patient Age:    86 years        BP:           165/72 mmHg Patient Gender: F               HR:           68 bpm. Exam Location:  Inpatient Procedure: 2D Echo, Color Doppler and Cardiac Doppler Indications:    NSTEMI  History:        Patient has no prior history of Echocardiogram examinations.                 Risk Factors:Hypertension and Dyslipidemia.  Sonographer:    Cleatis Polka Referring Phys: [id number] Delila Pereyra A KYLE IMPRESSIONS  1. Left ventricular ejection fraction, by estimation, is 55 to 60%. The left ventricle has normal function. The left ventricle has no regional wall motion abnormalities. There is mild concentric left ventricular hypertrophy. Left ventricular diastolic parameters are consistent with Grade I diastolic dysfunction (impaired relaxation).  2. Right ventricular systolic function is normal. The right ventricular size is normal. There is normal pulmonary artery systolic pressure. The estimated right ventricular systolic pressure is 31.5 mmHg.  3. A small pericardial effusion is present. The pericardial effusion is posterior to the left ventricle.  4. The mitral valve  is grossly normal. Mild mitral valve regurgitation. No evidence of mitral stenosis.  5. The aortic valve is tricuspid. There is mild calcification of the aortic valve. There is mild thickening of the aortic valve. Aortic valve regurgitation is not visualized. Aortic valve sclerosis is present, with no evidence of aortic valve stenosis.  6. The inferior vena cava is normal in size with greater than 50% respiratory variability, suggesting right atrial pressure of 3 mmHg. FINDINGS  Left Ventricle: Left ventricular ejection fraction, by estimation, is 55 to 60%. The left ventricle has normal function. The left ventricle has no regional wall motion abnormalities. The left ventricular internal cavity size was normal in size. There is  mild concentric left ventricular hypertrophy. Left ventricular diastolic parameters are consistent with Grade I diastolic dysfunction (impaired relaxation). Right Ventricle: The right ventricular size is normal. No increase in right ventricular wall thickness. Right ventricular systolic function is normal. There is normal pulmonary artery systolic pressure. The tricuspid regurgitant velocity is 2.67 m/s, and  with an assumed right atrial pressure of 3 mmHg, the estimated right ventricular systolic pressure is 31.5 mmHg. Left Atrium: Left atrial size was normal in size. Right Atrium: Right atrial size was normal in size. Pericardium: A small pericardial effusion is present. The pericardial effusion is posterior to the left ventricle. Mitral Valve: The mitral valve is grossly normal. Mild mitral valve regurgitation. No evidence of mitral valve stenosis. Tricuspid Valve: The tricuspid valve is grossly normal. Tricuspid valve regurgitation is trivial. No evidence of tricuspid stenosis. Aortic Valve: The aortic valve is tricuspid. There is mild calcification of the aortic valve. There is mild thickening of the aortic valve. Aortic valve regurgitation is not visualized. Aortic valve sclerosis is  present, with no evidence of aortic valve stenosis. Aortic valve peak gradient measures 5.5  mmHg. Pulmonic Valve: The pulmonic valve was grossly normal. Pulmonic valve regurgitation is trivial. No evidence of pulmonic stenosis. Aorta: The aortic root and ascending aorta are structurally normal, with no evidence of dilitation. Venous: The inferior vena cava is normal in size with greater than 50% respiratory variability, suggesting right atrial pressure of 3 mmHg. IAS/Shunts: The atrial septum is grossly normal.  LEFT VENTRICLE PLAX 2D LVIDd:         4.00 cm     Diastology LVIDs:         2.50 cm     LV e' medial:    4.46 cm/s LV PW:         1.30 cm     LV E/e' medial:  17.4 LV IVS:        1.10 cm     LV e' lateral:   5.87 cm/s LVOT diam:     1.80 cm     LV E/e' lateral: 13.2 LV SV:         37 LV SV Index:   26 LVOT Area:     2.54 cm  LV Volumes (MOD) LV vol d, MOD A2C: 95.2 ml LV vol d, MOD A4C: 99.0 ml LV vol s, MOD A2C: 45.0 ml LV vol s, MOD A4C: 46.6 ml LV SV MOD A2C:     50.2 ml LV SV MOD A4C:     99.0 ml LV SV MOD BP:      53.4 ml RIGHT VENTRICLE             IVC RV Basal diam:  3.90 cm     IVC diam: 1.20 cm RV Mid diam:    3.70 cm RV S prime:     14.40 cm/s TAPSE (M-mode): 2.2 cm LEFT ATRIUM           Index        RIGHT ATRIUM           Index LA diam:      3.50 cm 2.47 cm/m   RA Area:     12.60 cm LA Vol (A2C): 25.6 ml 18.09 ml/m  RA Volume:   26.10 ml  18.44 ml/m LA Vol (A4C): 37.4 ml 26.43 ml/m  AORTIC VALVE AV Area (Vmax): 1.61 cm AV Vmax:        117.00 cm/s AV Peak Grad:   5.5 mmHg LVOT Vmax:      73.80 cm/s LVOT Vmean:     52.600 cm/s LVOT VTI:       0.146 m  AORTA Ao Root diam: 2.70 cm Ao Asc diam:  2.90 cm MITRAL VALVE               TRICUSPID VALVE MV Area (PHT): 4.15 cm    TR Peak grad:   28.5 mmHg MV Decel Time: 183 msec    TR Vmax:        267.00 cm/s MV E velocity: 77.60 cm/s MV A velocity: 95.50 cm/s  SHUNTS MV E/A ratio:  0.81        Systemic VTI:  0.15 m                            Systemic  Diam: 1.80 cm Lennie Odor MD Electronically signed by Lennie Odor MD Signature Date/Time: 07/05/2021/1:54:33 PM    Final    US Abdomen Limited RUQ (LIVER/GB)  Result Date: 07/05/2021 CLINICAL DATA:  Elevated alkaline phosphatase. EXAM: ULTRASOUND ABDOMEN LIMITED  RIGHT UPPER QUADRANT COMPARISON:  None. FINDINGS: Gallbladder: Gallbladder wall is UPPER limits normal, measuring 1.5 millimeters. No sonographic Murphy's sign. No stones or pericholecystic fluid. Common bile duct: Diameter: 3.0 millimeters Liver: No focal lesion identified. Within normal limits in parenchymal echogenicity. Portal vein is patent on color Doppler imaging with normal direction of blood flow towards the liver. Other: Study quality is degraded by limited patient mobility and difficulty with breath holding. IMPRESSION: No evidence for acute abnormality of the RIGHT UPPER QUADRANT. Electronically Signed   By: Norva PavlovElizabeth  Brown M.D.   On: 07/05/2021 09:23    Labs:  CBC: Recent Labs    04/14/21 0325 07/05/21 0102 07/06/21 0308 07/06/21 1618  WBC 4.1 8.1 7.1 6.1  HGB 8.9* 9.8* 9.1* 9.5*  HCT 26.4* 29.4* 27.4* 27.6*  PLT 318 362 328 313    COAGS: Recent Labs    07/05/21 0315  INR 1.1  APTT 23*    BMP: Recent Labs    04/14/21 0325 07/05/21 0244 07/06/21 1008 07/07/21 0326 07/08/21 0721  NA 138 131*  --  132* 131*  K 3.2* 4.4  --  3.6 3.7  CL 104 100  --  98 95*  CO2 24 23  --  25 26  GLUCOSE 88 117*  --  97 100*  BUN 9 12  --  11 9  CALCIUM 8.8* 8.8*  --  8.8* 8.8*  CREATININE 0.36* 0.31* 1.13* 0.49 0.40*  GFRNONAA >60 >60 48* >60 >60    LIVER FUNCTION TESTS: Recent Labs    04/13/21 1655 04/14/21 0325 07/05/21 0244 07/08/21 0721  BILITOT 0.6 0.6 0.9 0.7  AST 60* 51* 67* 44*  ALT 71* 59* 52* 40  ALKPHOS 636* 549* 606* 544*  PROT 6.8 5.8* 6.9 6.6  ALBUMIN 3.5 2.7* 3.2* 2.7*    TUMOR MARKERS: No results for input(s): AFPTM, CEA, CA199, CHROMGRNA in the last 8760 hours.  Assessment and  Plan: Acute symptomatic T1 and T6 compression fractures after fall. Persistent moderate pain requiring narcotic pain control. Cardiology notes/recs reviewed After long conversation with pt and her husband discussing options of conservative management vs kyphoplasty procedure to improve pain and mobility sooner, they wish to move forward with procedure.  IR team has confirmed insurance authorization to proceed. Will plan for procedure to be done tomorrow.  Will make NPO p MN and hold subq heparin.  Risks and benefits of kyphoplasty were discussed with the patient including, but not limited to education regarding the natural healing process of compression fractures without intervention, bleeding, infection, cement migration which may cause spinal cord damage, paralysis, pulmonary embolism or even death.  This interventional procedure involves the use of X-rays and because of the nature of the planned procedure, it is possible that we will have prolonged use of X-ray fluoroscopy.  Potential radiation risks to you include (but are not limited to) the following: - A slightly elevated risk for cancer  several years later in life. This risk is typically less than 0.5% percent. This risk is low in comparison to the normal incidence of human cancer, which is 33% for women and 50% for men according to the [employer]. - Radiation induced injury can include skin redness, resembling a rash, tissue breakdown / ulcers and hair loss (which can be temporary or permanent).   The likelihood of either of these occurring depends on the difficulty of the procedure and whether you are sensitive to radiation due to previous procedures, disease, or genetic conditions.  IF your procedure requires a prolonged use of radiation, you will be notified and given written instructions for further action.  It is your responsibility to monitor the irradiated area for the 2 weeks following the procedure and to  notify your physician if you are concerned that you have suffered a radiation induced injury.    All of the patient's questions were answered, patient is agreeable to proceed.  Consent signed and in chart.   Thank you for this interesting consult.  I greatly enjoyed meeting Natasha Harper and look forward to participating in their care.  A copy of this report was sent to the requesting provider on this date.  Electronically Signed: Brayton El, PA-C 07/10/2021, 10:37 AM   I spent a total of 20 minutes in face to face in clinical consultation, greater than 50% of which was counseling/coordinating care for thoracic kyphoplasty

## 2021-07-11 ENCOUNTER — Inpatient Hospital Stay (HOSPITAL_COMMUNITY): Payer: Medicare Other

## 2021-07-11 DIAGNOSIS — S22050A Wedge compression fracture of T5-T6 vertebra, initial encounter for closed fracture: Secondary | ICD-10-CM | POA: Diagnosis not present

## 2021-07-11 DIAGNOSIS — I248 Other forms of acute ischemic heart disease: Secondary | ICD-10-CM | POA: Diagnosis not present

## 2021-07-11 DIAGNOSIS — R778 Other specified abnormalities of plasma proteins: Secondary | ICD-10-CM | POA: Diagnosis not present

## 2021-07-11 DIAGNOSIS — I10 Essential (primary) hypertension: Secondary | ICD-10-CM | POA: Diagnosis not present

## 2021-07-11 HISTORY — PX: IR KYPHO EA ADDL LEVEL THORACIC OR LUMBAR: IMG5520

## 2021-07-11 HISTORY — PX: IR KYPHO THORACIC WITH BONE BIOPSY: IMG5518

## 2021-07-11 LAB — BASIC METABOLIC PANEL
Anion gap: 6 (ref 5–15)
BUN: 10 mg/dL (ref 8–23)
CO2: 26 mmol/L (ref 22–32)
Calcium: 8.6 mg/dL — ABNORMAL LOW (ref 8.9–10.3)
Chloride: 97 mmol/L — ABNORMAL LOW (ref 98–111)
Creatinine, Ser: 0.45 mg/dL (ref 0.44–1.00)
GFR, Estimated: >60 mL/min (ref >60)
Glucose, Bld: 89 mg/dL (ref 70–99)
Potassium: 3.6 mmol/L (ref 3.5–5.1)
Sodium: 129 mmol/L — ABNORMAL LOW (ref 135–145)

## 2021-07-11 LAB — CBC WITH DIFFERENTIAL/PLATELET
Abs Immature Granulocytes: 0.01 10*3/uL (ref 0.00–0.07)
Basophils Absolute: 0 10*3/uL (ref 0.0–0.1)
Basophils Relative: 0 %
Eosinophils Absolute: 0.1 10*3/uL (ref 0.0–0.5)
Eosinophils Relative: 1 %
HCT: 29.4 % — ABNORMAL LOW (ref 36.0–46.0)
Hemoglobin: 10.1 g/dL — ABNORMAL LOW (ref 12.0–15.0)
Immature Granulocytes: 0 %
Lymphocytes Relative: 14 %
Lymphs Abs: 0.8 10*3/uL (ref 0.7–4.0)
MCH: 34.7 pg — ABNORMAL HIGH (ref 26.0–34.0)
MCHC: 34.4 g/dL (ref 30.0–36.0)
MCV: 101 fL — ABNORMAL HIGH (ref 80.0–100.0)
Monocytes Absolute: 0.5 10*3/uL (ref 0.1–1.0)
Monocytes Relative: 8 %
Neutro Abs: 4.7 10*3/uL (ref 1.7–7.7)
Neutrophils Relative %: 77 %
Platelets: 353 10*3/uL (ref 150–400)
RBC: 2.91 MIL/uL — ABNORMAL LOW (ref 3.87–5.11)
RDW: 13.4 % (ref 11.5–15.5)
WBC: 6.1 10*3/uL (ref 4.0–10.5)
nRBC: 0 % (ref 0.0–0.2)

## 2021-07-11 MED ORDER — LIDOCAINE HCL (PF) 1 % IJ SOLN
INTRAMUSCULAR | Status: DC | PRN
  Administered 2021-07-11: 10 mL

## 2021-07-11 MED ORDER — LABETALOL HCL 5 MG/ML IV SOLN
INTRAVENOUS | Status: DC | PRN
  Administered 2021-07-11 (×2): 5 mg via INTRAVENOUS

## 2021-07-11 MED ORDER — CEFAZOLIN SODIUM-DEXTROSE 2-4 GM/100ML-% IV SOLN
INTRAVENOUS | Status: AC
  Administered 2021-07-11: 2 g via INTRAVENOUS
  Filled 2021-07-11: qty 100

## 2021-07-11 MED ORDER — LABETALOL HCL 5 MG/ML IV SOLN
INTRAVENOUS | Status: AC
  Filled 2021-07-11: qty 4

## 2021-07-11 MED ORDER — FENTANYL CITRATE (PF) 100 MCG/2ML IJ SOLN
INTRAMUSCULAR | Status: DC | PRN
  Administered 2021-07-11: 25 ug via INTRAVENOUS
  Administered 2021-07-11: 12.5 ug via INTRAVENOUS

## 2021-07-11 MED ORDER — BUPIVACAINE HCL (PF) 0.5 % IJ SOLN
INTRAMUSCULAR | Status: AC
  Filled 2021-07-11: qty 30

## 2021-07-11 MED ORDER — HEPARIN SODIUM (PORCINE) 5000 UNIT/ML IJ SOLN
5000.0000 [IU] | Freq: Three times a day (TID) | INTRAMUSCULAR | Status: DC
  Administered 2021-07-13 – 2021-07-24 (×33): 5000 [IU] via SUBCUTANEOUS
  Filled 2021-07-11 (×33): qty 1

## 2021-07-11 MED ORDER — LIDOCAINE HCL (PF) 1 % IJ SOLN
INTRAMUSCULAR | Status: AC
  Filled 2021-07-11: qty 30

## 2021-07-11 MED ORDER — MIDAZOLAM HCL 2 MG/2ML IJ SOLN
INTRAMUSCULAR | Status: AC
  Filled 2021-07-11: qty 2

## 2021-07-11 MED ORDER — BUPIVACAINE HCL (PF) 0.5 % IJ SOLN
INTRAMUSCULAR | Status: DC | PRN
  Administered 2021-07-11: 10 mL

## 2021-07-11 MED ORDER — FENTANYL CITRATE (PF) 100 MCG/2ML IJ SOLN
INTRAMUSCULAR | Status: AC
  Filled 2021-07-11: qty 2

## 2021-07-11 MED ORDER — MIDAZOLAM HCL 2 MG/2ML IJ SOLN
INTRAMUSCULAR | Status: DC | PRN
  Administered 2021-07-11 (×2): .5 mg via INTRAVENOUS

## 2021-07-11 NOTE — Sedation Documentation (Signed)
Patient is resting comfortably. VSS °

## 2021-07-11 NOTE — Sedation Documentation (Signed)
Patient is resting comfortably. 

## 2021-07-11 NOTE — Progress Notes (Signed)
PROGRESS NOTE  Natasha Harper RFF:[id number] DOB: 06/06/1935 DOA: 07/04/2021 PCP: [name], MD  Brief History   Patient is a 87 year old female with history of hypothyroidism, hyperlipidemia, coronary artery disease, hypertension who presented with back pain and falls from home.  She fell at home and is unable to get up.  On presentation CT of the thoracic spine showed T1 and T6 compression fracture, lab work showed elevated troponin.  Started on heparin drip on admission now discontinued by cardiology because elevated troponin thought to be from demand ischemia.  Waiting for IR guided kyphoplasty.  PT/OT will be consulted after kyphoplasty.  The patient went for kyphoplasty. Unfortunately IR was not able to visualize T1 well enough for kyphoplasty to be performed despite multiple attempts to reposition her. However, the procedure was successful as performed on T6.   Consultants  Interventional radiology Cardiology  Procedures  Kyphoplasty`  Antibiotics   Anti-infectives (From admission, onward)    Start     Dose/Rate Route Frequency Ordered Stop   07/11/21 0800  ceFAZolin (ANCEF) IVPB 2g/100 mL premix        2 g 200 mL/hr over 30 Minutes Intravenous To Radiology 07/10/21 1055 07/11/21 0900      Subjective  The patient is resting quietly. No new complaints. She is somnolent after her procedure.  Objective   Vitals:  Vitals:   07/11/21 1000 07/11/21 1134  BP: (!) 148/59 (!) 141/51  Pulse: 74 62  Resp: 20 18  Temp:  98.5 F (36.9 C)  SpO2: 91% 97%    Exam:  Constitutional:  The patient is somnolent, but easily rousable. No acute distress. Respiratory:  No increased work of breathing. No wheezes, rales, or rhonchi No tactile fremitus Cardiovascular:  Regular rate and rhythm No murmurs, ectopy, or gallups. No lateral PMI. No thrills. Abdomen:  Abdomen is soft, non-tender, non-distended No hernias, masses, or organomegaly Normoactive bowel sounds.   Musculoskeletal:  No cyanosis, clubbing, or edema Skin:  No rashes, lesions, ulcers palpation of skin: no induration or nodules Neurologic:  CN 2-12 intact Sensation all 4 extremities intact Psychiatric:  Mental status Mood, affect appropriate Orientation to person, place, time  judgment and insight appear intact   I have personally reviewed the following:   Today's Data  Vitals  Lab Data  CMP CBC  Micro Data  MRSA negative  Imaging  CTA chest CT head without contrast CT lumbar and thoracic spine  Cardiology Data  EKG Echocardiogram  Other Data    Scheduled Meds:  amLODipine  10 mg Oral Daily   aspirin EC  81 mg Oral Daily   atorvastatin  80 mg Oral Daily   bupivacaine       heparin  5,000 Units Subcutaneous Q8H   lidocaine (PF)       losartan  50 mg Oral Daily   mouth rinse  15 mL Mouth Rinse BID   metoprolol tartrate  12.5 mg Oral BID   polyethylene glycol  17 g Oral Daily   Continuous Infusions:  Principal Problem:   Thoracic compression fracture (HCC) Active Problems:   Elevated troponin   Demand ischemia (HCC)   Primary hypertension   LOS: 6 days   A & P  Fall/compression fracture: CT thoracic spine showed T1, T6 compression fracture with 10 to 20% height loss with no retropulsion or spinal canal stenosis.  Continue pain management.  IR has been consulted for the consideration of kyphoplasty. The patient went for kyphoplasty on 07/11/2020. Unfortunately  IR was not able to visualize T1 well enough for kyphoplasty to be performed despite multiple attempts to reposition her. However, the procedure was successful as performed on T6.     NSTEMI/history of coronary artery disease: Troponin was elevated troponin in the range of 2K but flat trended  She has history of coronary artery disease status post PCI to RCA and diagonal in 1993 .  Has residual 70% LAD lesion. Echo done here showed left ventricular ejection fraction of 55-60%, normal regional  wall motion.  Elevated troponin felt to be from demand ischemia.  Patient currently denies any chest pain.  Heparin drip discontinued.  No plans for cardiac cath.  Cardiology was following. No complaint of chest pain but she has sternal tenderness likely related to fall   Hypertension: BP fulctuating.  On losartan and metoprolol which have been continued.  Monitor blood pressure.  Continue as needed medications for severe hypertension.  Elevated blood pressure likely from pain. Added amlodipine 10 mg daily   Hyperlipidemia: On Lipitor   Right breast abnormal imaging: As seen on CTA: 2X 1.9 cm high-attenuation lesion in the right. She has breast lumps for years and was following with GYN, had mammograms, excisional biopsy of one of her breast was found to be benign.  She does not want further investigation.  If she wishes, outpatient mammogram/breast ultrasound recommended as outpatient to exclude neoplasm of breast   Elevated ALP: GGT elevated, ALP elevated.  Abdominal ultrasound did not show any abnormality.  Could be associated with pathology with bones with possible underlying breast cancer.  Can be monitored as an outpatient.   Macrocytic anemia: Currently hemoglobin stable in the range of 9.  Iron studies showed normal iron.  Normal vitamin B12 and folic acid   Hyponatremia: Mild, stable, continue to monitor   I have seen and examined this patient myself. I have spent 34 minutes in her evaluation and care.   Erland Vivas, DO [facility] Direct contact: see [email or web address]  7PM-7AM contact night coverage as above 07/11/2021, 1:36 PM  LOS: 6 days

## 2021-07-11 NOTE — Procedures (Signed)
INTERVENTIONAL NEURORADIOLOGY BRIEF POSTPROCEDURE NOTE  FLUOROSCOPY GUIDED T6 CORE BONE AND BALLOON KYPHOPLASTY   Attending: Dr. Erven Colla de Sindy Messing   Assistant: None.   Diagnosis: T1 and T6 acute compression fractures.   Access site: Percutaneous bilateral transpedicular.   Anesthesia: Moderate sedation.   Medication used: 1 mg Versed IV; 37.5 mcg Fentanyl IV; 2 g of Ancef.  Complications: None.   Estimated blood loss: Negligible.   Specimen: One T6 core bone biopsy.   Findings: The T1 vertebral body was not well visualized on lateral fluoroscopy due to shoulders superimposition.  Multiple attempts made to position the patient in a more favorable way proved unsuccessful.  T6 core bone biopsy and bone kyphoplasty performed successfully.   The patient tolerated the procedure well without incident or complication and is in stable condition.

## 2021-07-11 NOTE — Progress Notes (Signed)
Mobility Specialist Progress Note   07/11/21 1239  Mobility  Activity Contraindicated/medical hold   Per advisement of RN d/t recent procedure and bed rest orders.  Frederico Hamman Mobility Specialist Phone Number [phone]

## 2021-07-11 NOTE — Sedation Documentation (Signed)
Vital signs stable. Procedure continues °

## 2021-07-11 NOTE — Sedation Documentation (Signed)
Vital signs stable. 

## 2021-07-11 NOTE — Sedation Documentation (Signed)
Patient is resting

## 2021-07-12 ENCOUNTER — Encounter (HOSPITAL_COMMUNITY): Payer: Self-pay

## 2021-07-12 DIAGNOSIS — I248 Other forms of acute ischemic heart disease: Secondary | ICD-10-CM | POA: Diagnosis not present

## 2021-07-12 DIAGNOSIS — E871 Hypo-osmolality and hyponatremia: Secondary | ICD-10-CM

## 2021-07-12 DIAGNOSIS — S22050A Wedge compression fracture of T5-T6 vertebra, initial encounter for closed fracture: Secondary | ICD-10-CM | POA: Diagnosis not present

## 2021-07-12 DIAGNOSIS — D539 Nutritional anemia, unspecified: Secondary | ICD-10-CM

## 2021-07-12 DIAGNOSIS — R778 Other specified abnormalities of plasma proteins: Secondary | ICD-10-CM | POA: Diagnosis not present

## 2021-07-12 DIAGNOSIS — I10 Essential (primary) hypertension: Secondary | ICD-10-CM | POA: Diagnosis not present

## 2021-07-12 LAB — BASIC METABOLIC PANEL
Anion gap: 7 (ref 5–15)
BUN: 13 mg/dL (ref 8–23)
CO2: 24 mmol/L (ref 22–32)
Calcium: 8.4 mg/dL — ABNORMAL LOW (ref 8.9–10.3)
Chloride: 98 mmol/L (ref 98–111)
Creatinine, Ser: 0.34 mg/dL — ABNORMAL LOW (ref 0.44–1.00)
GFR, Estimated: >60 mL/min (ref >60)
Glucose, Bld: 91 mg/dL (ref 70–99)
Potassium: 3.5 mmol/L (ref 3.5–5.1)
Sodium: 129 mmol/L — ABNORMAL LOW (ref 135–145)

## 2021-07-12 LAB — SURGICAL PATHOLOGY

## 2021-07-12 MED ORDER — FUROSEMIDE 10 MG/ML IJ SOLN
20.0000 mg | Freq: Once | INTRAMUSCULAR | Status: AC
  Administered 2021-07-12: 20 mg via INTRAVENOUS
  Filled 2021-07-12: qty 2

## 2021-07-12 MED ORDER — SODIUM CHLORIDE 1 G PO TABS
1.0000 g | ORAL_TABLET | Freq: Two times a day (BID) | ORAL | Status: DC
  Administered 2021-07-12 – 2021-07-23 (×24): 1 g via ORAL
  Filled 2021-07-12 (×25): qty 1

## 2021-07-12 NOTE — Progress Notes (Signed)
PROGRESS NOTE  Natasha Harper KDT:[id number] DOB: 1934/10/05 DOA: 07/04/2021 PCP: [name], MD  Brief History   Patient is a 87 year old female with history of hypothyroidism, hyperlipidemia, coronary artery disease, hypertension who presented with back pain and falls from home.  She fell at home and is unable to get up.  On presentation CT of the thoracic spine showed T1 and T6 compression fracture, lab work showed elevated troponin.  Started on heparin drip on admission now discontinued by cardiology because elevated troponin thought to be from demand ischemia.  Waiting for IR guided kyphoplasty.  PT/OT will be consulted after kyphoplasty.  The patient went for kyphoplasty. Unfortunately IR was not able to visualize T1 well enough for kyphoplasty to be performed despite multiple attempts to reposition her. However, the procedure was successful as performed on T6.   The patient has been evaluated by PT/OT. He is felt to be a candidate for CIR. Rehab has been consulted to further evaluate her.  Consultants  Interventional radiology Cardiology  Procedures  Kyphoplasty`  Antibiotics   Anti-infectives (From admission, onward)    Start     Dose/Rate Route Frequency Ordered Stop   07/11/21 0800  ceFAZolin (ANCEF) IVPB 2g/100 mL premix        2 g 200 mL/hr over 30 Minutes Intravenous To Radiology 07/10/21 1055 07/11/21 0900      Subjective  The patient is resting quietly. She states that she does have some pain in her back, but mostly just "soreness" at the site of the procedure. Her spouse is at bedside.  Objective   Vitals:  Vitals:   07/12/21 0726 07/12/21 1151  BP: (!) 148/67 134/69  Pulse: 75 72  Resp: 19 18  Temp: 98 F (36.7 C) 97.9 F (36.6 C)  SpO2: 97% 94%    Exam:  Constitutional:  The patient is awake, alert, and oriented x 3. No acute distress. Respiratory:  No increased work of breathing. No wheezes, rales, or rhonchi No tactile  fremitus Cardiovascular:  Regular rate and rhythm No murmurs, ectopy, or gallups. No lateral PMI. No thrills. Abdomen:  Abdomen is soft, non-tender, non-distended No hernias, masses, or organomegaly Normoactive bowel sounds.  Musculoskeletal:  No cyanosis, clubbing, or edema Skin:  No rashes, lesions, ulcers palpation of skin: no induration or nodules Neurologic:  CN 2-12 intact Sensation all 4 extremities intact Psychiatric:  Mental status Mood, affect appropriate Orientation to person, place, time  judgment and insight appear intact  I have personally reviewed the following:   Today's Data  Vitals  Lab Data  BMP CBC  Micro Data  MRSA negative  Imaging  CTA chest CT head without contrast CT lumbar and thoracic spine  Cardiology Data  EKG Echocardiogram  Other Data    Scheduled Meds:  amLODipine  10 mg Oral Daily   aspirin EC  81 mg Oral Daily   atorvastatin  80 mg Oral Daily   heparin  5,000 Units Subcutaneous Q8H   losartan  50 mg Oral Daily   mouth rinse  15 mL Mouth Rinse BID   metoprolol tartrate  12.5 mg Oral BID   polyethylene glycol  17 g Oral Daily   sodium chloride  1 g Oral BID WC    Principal Problem:   Thoracic compression fracture (HCC) Active Problems:   Elevated troponin   Demand ischemia (HCC)   Primary hypertension   LOS: 7 days   A & P  Fall/compression fracture: CT thoracic spine showed T1,  T6 compression fracture with 10 to 20% height loss with no retropulsion or spinal canal stenosis.  Continue pain management.  IR has been consulted for the consideration of kyphoplasty. The patient went for kyphoplasty on 07/11/2020. Unfortunately IR was not able to visualize T1 well enough for kyphoplasty to be performed despite multiple attempts to reposition her. However, the procedure was successful as performed on T6. The patient has been evaluated by PT/OT. He is felt to be a candidate for CIR. Rehab has been consulted to further evaluate  her.  NSTEMI/history of coronary artery disease: Troponin was elevated troponin in the range of 2K but flat trended  She has history of coronary artery disease status post PCI to RCA and diagonal in 1993 .  Has residual 70% LAD lesion. Echo done here showed left ventricular ejection fraction of 55-60%, normal regional wall motion.  Elevated troponin felt to be from demand ischemia.  Patient currently denies any chest pain.  Heparin drip discontinued.  No plans for cardiac cath.  Cardiology was following. No complaint of chest pain but she has sternal tenderness likely related to fall.   Hypertension: Pt is currently normotensive on losartan and metoprolol which have been continued as he takes them at home and amlodipine which was added  as inpatient to better control his blood pressure.  Monitor.     Hyperlipidemia: On Lipitor   Right breast abnormal imaging: As seen on CTA: 2X 1.9 cm high-attenuation lesion in the right. She has breast lumps for years and was following with GYN, had mammograms, excisional biopsy of one of her breast was found to be benign.  She does not want further investigation.  If she wishes, outpatient mammogram/breast ultrasound recommended as outpatient to exclude neoplasm of breast   Elevated ALP: GGT elevated, ALP elevated.  Abdominal ultrasound did not show any abnormality.  Could be associated with pathology with bones with possible underlying breast cancer.  Can be monitored as an outpatient.   Macrocytic anemia: Currently hemoglobin stable in the range of 9.  Iron studies showed normal iron.  Normal vitamin B12 and folic acid   Hyponatremia: Mild, stable, continue to monitor. Pt is a little volume overloaded. Lasix one time given with NaCl tablets. Monitor.   I have seen and examined this patient myself. I have spent 34 minutes in her evaluation and care.   Kenyona Rena, DO [facility] Direct contact: see [email or web address]  7PM-7AM contact night coverage as  above 07/12/2021, 3:44 PM  LOS: 6 days

## 2021-07-12 NOTE — Progress Notes (Signed)
Mobility Specialist Progress Note   07/12/21 1515  Mobility  Activity Contraindicated/medical hold   Per advisement of PT/OT   Frederico Hamman Mobility Specialist Phone Number [phone]

## 2021-07-12 NOTE — Evaluation (Signed)
Physical Therapy Evaluation Patient Details Name: Natasha Harper MRN: [id number] DOB: 1935-02-21 Today's Date: 07/12/2021  History of Present Illness  87 yo female presents to ED on 12/29 with x2 falls, back pain sustaining T1, T6 thoracic compression fractures. Pt also with elevated troponins, NSTEMI, cards following. CTH negative for acute findings. s/p FLUOROSCOPY GUIDED T6 CORE BONE AND BALLOON KYPHOPLASTY  on 1/5. PMH includes CAD with angioplasty 1993, HTN, osteoporosis.  Clinical Impression   Pt presents with generalized weakness, back pain post-procedure but improving, difficulty performing mobility tasks, impaired balance with history of falls, and decreased activity tolerance vs baseline. Pt to benefit from acute PT to address deficits. Pt overall requiring min-mod +2 for bed mobility and transfer-level mobility, will need post-acute rehab in order to progress mobility and pt is eager to be more independent. Pt's husband is at bedside and supportive. PT to progress mobility as tolerated, and will continue to follow acutely.         Recommendations for follow up therapy are one component of a multi-disciplinary discharge planning process, led by the attending physician.  Recommendations may be updated based on patient status, additional functional criteria and insurance authorization.  Follow Up Recommendations Acute inpatient rehab (3hours/day)    Assistance Recommended at Discharge Frequent or constant Supervision/Assistance  Patient can return home with the following  A little help with bathing/dressing/bathroom;A little help with walking and/or transfers;Assistance with cooking/housework;Assist for transportation    Equipment Recommendations None recommended by PT  Recommendations for Other Services       Functional Status Assessment Patient has had a recent decline in their functional status and demonstrates the ability to make significant improvements in function in a  reasonable and predictable amount of time.     Precautions / Restrictions Precautions Precautions: Fall Precaution Comments: reviewed spinal precautions      Mobility  Bed Mobility Overal bed mobility: Needs Assistance Bed Mobility: Rolling;Sidelying to Sit;Sit to Sidelying Rolling: Min assist Sidelying to sit: Min assist;+2 for physical assistance     Sit to sidelying: Mod assist;+2 for physical assistance General bed mobility comments: min assist for log roll to EOB, +2 for simultaneous trunk elevation and LE lowering over EOB. Increased assist for return to supine for lifting LEs, boost up in bed, sequencing log roll.    Transfers Overall transfer level: Needs assistance Equipment used: Rolling walker (2 wheels) Transfers: Sit to/from Stand;Bed to chair/wheelchair/BSC Sit to Stand: Min assist;+2 physical assistance   Step pivot transfers: Min assist;+2 physical assistance;+2 safety/equipment       General transfer comment: min +2 for power up, rise, steadying, and pivotal steps to [facility] towards pt R. Additonal assist for lines/leads and RW management.    Ambulation/Gait               General Gait Details: pt fatigued  Stairs            Wheelchair Mobility    Modified Rankin (Stroke Patients Only)       Balance Overall balance assessment: Needs assistance;History of Falls Sitting-balance support: No upper extremity supported;Feet supported Sitting balance-[name]: Fair     Standing balance support: Bilateral upper extremity supported;During functional activity;Reliant on assistive device for balance Standing balance-[name]: Poor                               Pertinent Vitals/Pain Pain Assessment: Faces Faces Pain Scale: Hurts little more Pain Location: back  and sternum, during bed mobility Pain Descriptors / Indicators: Discomfort;Grimacing Pain Intervention(s): Monitored during session;Premedicated before  session;Repositioned    Home Living Family/patient expects to be discharged to:: Private residence Living Arrangements: Spouse/significant other Available Help at Discharge: Family;Available 24 hours/day Type of Home: House Home Access: Level entry       Home Layout: Two level;Able to live on main level with bedroom/bathroom Home Equipment: Rolling Walker (2 wheels);Shower seat;Hand held shower head (lift chair) Additional Comments: spouse has a hernia and a "bad knee" so can provide only limited assistance    Prior Function Prior Level of Function : Needs assist             Mobility Comments: pt requiring supervision for mobility, uses RW ADLs Comments: pt's spouse reports pt needs assist with wash up, per pt would like to be as independent as possible     Hand Dominance   Dominant Hand: Right    Extremity/Trunk Assessment   Upper Extremity Assessment Upper Extremity Assessment: Generalized weakness    Lower Extremity Assessment Lower Extremity Assessment: Generalized weakness    Cervical / Trunk Assessment Cervical / Trunk Assessment: Kyphotic  Communication   Communication: HOH  Cognition Arousal/Alertness: Awake/alert Behavior During Therapy: WFL for tasks assessed/performed Overall Cognitive Status: Impaired/Different from baseline                                 General Comments: pt follows commands well when she hears them, requires cuing for safety throughout mobility. pt requesting        General Comments General comments (skin integrity, edema, etc.): VSS, redness in sacral area noted to have color changes    Exercises     Assessment/Plan    PT Assessment Patient needs continued PT services  PT Problem List Decreased strength;Decreased mobility;Decreased activity tolerance;Decreased balance;Decreased knowledge of use of DME;Pain;Decreased skin integrity;Decreased safety awareness       PT Treatment Interventions DME  instruction;Therapeutic activities;Gait training;Therapeutic exercise;Patient/family education;Balance training;Stair training;Functional mobility training    PT Goals (Current goals can be found in the Care Plan section)  Acute Rehab PT Goals Patient Stated Goal: independence PT Goal Formulation: With patient/family Time For Goal Achievement: 07/26/21 Potential to Achieve Goals: Good    Frequency Min 3X/week     Co-evaluation PT/OT/SLP Co-Evaluation/Treatment: Yes Reason for Co-Treatment: For patient/therapist safety;Necessary to address cognition/behavior during functional activity;To address functional/ADL transfers PT goals addressed during session: Mobility/safety with mobility;Balance;Proper use of DME OT goals addressed during session: Proper use of Adaptive equipment and DME;ADL's and self-care;Strengthening/ROM       AM-PAC PT "6 Clicks" Mobility  Outcome Measure Help needed turning from your back to your side while in a flat bed without using bedrails?: A Little Help needed moving from lying on your back to sitting on the side of a flat bed without using bedrails?: A Lot Help needed moving to and from a bed to a chair (including a wheelchair)?: A Lot Help needed standing up from a chair using your arms (e.g., wheelchair or bedside chair)?: A Little Help needed to walk in hospital room?: A Lot Help needed climbing 3-5 steps with a railing? : A Lot 6 Click Score: 14    End of Session   Activity Tolerance: Patient tolerated treatment well;Patient limited by pain Patient left: in bed;with call bell/phone within reach;with bed alarm set;with family/visitor present Nurse Communication: Mobility status PT Visit Diagnosis: Other abnormalities of gait  and mobility (R26.89);Difficulty in walking, not elsewhere classified (R26.2)    Time: [id number] PT Time Calculation (min) (ACUTE ONLY): 30 min   Charges:   PT Evaluation $PT Eval Moderate Complexity: 1 Mod          Breeona Waid S, PT DPT Acute Rehabilitation Services Pager [phone]  Office [phone]   Dodge E Ruffin Pyo 07/12/2021, 3:04 PM

## 2021-07-12 NOTE — Progress Notes (Signed)
Inpatient Rehab Admissions Coordinator:  ? ?Per therapy recommendations,  patient was screened for CIR candidacy by Anuoluwapo Mefferd, MS, CCC-SLP. At this time, Pt. Appears to be a a potential candidate for CIR. I will place   order for rehab consult per protocol for full assessment. Please contact me any with questions. ? ?Nicholous Girgenti, MS, CCC-SLP ?Rehab Admissions Coordinator  ?[phone] (celll) ?[phone] (office) ? ?

## 2021-07-12 NOTE — Progress Notes (Signed)
Referring Physician(s): Margie Ege  Supervising Physician: Baldemar Lenis  Patient Status:  [facility] - In-pt  Chief Complaint:  F/u T6 kyphoplasty  Brief History:  Natasha Harper is a 87 y.o. female with hx of falls.   She's had prior falls and imaging showing thoracic and lumbar compression fractures. She did not require any intervention for those.  She suffered another fall a few days ago and came to [facility].   CT scan showed new compression fractures at the T1 and T6 levels.   Upon admission, she c/o substernal chest pain upon admission as well as moderate to severe back pain and her cardiac enzymes were elevated.   There was concern for NSTEMI and she was transferred to [facility] after evaluation by cardiology.   After a few days of observation and heparin gtt, she required no further intervention and was cleared by cardiology.   She rated her pain 6-7/10 and is currently requiring narcotic pain medication for relief.  She underwent T6 vertebral augmentation yesterday by Dr. Tommie Sams  Subjective:  Lying in bed. States she is still having a hard time moving but tells me her back feels better.  Allergies: Actonel [risedronate sodium], Evista [raloxifene], and Fosamax [alendronate sodium]  Medications: Prior to Admission medications   Medication Sig Start Date End Date Taking? Authorizing Provider  acetaminophen (TYLENOL) 500 MG tablet Take 1,000 mg by mouth every 6 (six) hours as needed for moderate pain.   Yes [provider]  Ascorbic Acid (VITAMIN C) 100 MG tablet Take 100 mg by mouth daily.   Yes [provider]  atorvastatin (LIPITOR) 80 MG tablet TAKE 1 TABLET BY MOUTH DAILY Patient taking differently: Take 80 mg by mouth daily. 10/30/20  Yes Panosh, Neta Mends, MD  cholecalciferol (VITAMIN D3) 25 MCG (1000 UNIT) tablet Take 1,000 Units by mouth daily.   Yes [provider]  fish oil-omega-3 fatty acids  1000 MG capsule Take 1,000 mg by mouth daily.   Yes [provider]  lidocaine (LIDODERM) 5 % Place 1 patch onto the skin daily. Remove & Discard patch within 12 hours or as directed by MD Patient taking differently: Place 1 patch onto the skin daily as needed (pain). Remove & Discard patch within 12 hours or as directed by MD 03/05/21  Yes Panosh, Neta Mends, MD  Menaquinone-7 (VITAMIN K2 PO) Take 1 tablet by mouth daily.   Yes [provider]  MULTIPLE VITAMIN PO Take 1 tablet by mouth daily.   Yes [provider]  SYNTHROID 50 MCG tablet TAKE 1 TABLET BY MOUTH DAILY Patient taking differently: Take 50 mcg by mouth daily before breakfast. 10/30/20  Yes Panosh, Neta Mends, MD  amLODipine (NORVASC) 5 MG tablet Take 1 tablet (5 mg total) by mouth daily. Patient not taking: Reported on 07/06/2021 02/10/21   Meredeth Ide, MD  aspirin 81 MG tablet Take 81 mg by mouth daily.    [provider]  baclofen (LIORESAL) 10 MG tablet Take 0.5-1 tablets (5-10 mg total) by mouth 3 (three) times daily as needed for muscle spasms. Patient not taking: Reported on 04/14/2021 01/28/21   Hilts, Casimiro Needle, MD  folic acid (FOLVITE) 1 MG tablet Take 1 tablet (1 mg total) by mouth daily. Patient not taking: Reported on 07/06/2021 04/16/21   Glade Lloyd, MD  traMADol (ULTRAM) 50 MG tablet Take 1 tablet (50 mg total) by mouth every 6 (six) hours as needed for moderate  pain. Patient not taking: Reported on 07/06/2021 04/15/21   Aline August, MD     Vital Signs: BP 134/69 (BP Location: Right Arm)    Pulse 72    Temp 97.9 F (36.6 C) (Oral)    Resp 18    Ht 5\' 1"  (1.549 m)    Wt 46.1 kg    LMP 07/07/1997    SpO2 94%    BMI 19.20 kg/m   Physical Exam Vitals reviewed.  Constitutional:      Appearance: Normal appearance.  Cardiovascular:     Rate and Rhythm: Normal rate.  Pulmonary:     Effort: Pulmonary effort is normal. No respiratory distress.  Abdominal:     Palpations: Abdomen is  soft.  Musculoskeletal:     Comments: Thoracic access sites ok. Small amount of blood on the bandage but no evidence of active bleeding.  Neurological:     General: No focal deficit present.     Mental Status: She is alert and oriented to person, place, and time.  Psychiatric:        Mood and Affect: Mood normal.        Behavior: Behavior normal.        Thought Content: Thought content normal.        Judgment: Judgment normal.    Imaging: No results found.  Labs:  CBC: Recent Labs    07/05/21 0102 07/06/21 0308 07/06/21 1618 07/11/21 0405  WBC 8.1 7.1 6.1 6.1  HGB 9.8* 9.1* 9.5* 10.1*  HCT 29.4* 27.4* 27.6* 29.4*  PLT 362 328 313 353    COAGS: Recent Labs    07/05/21 0315  INR 1.1  APTT 23*    BMP: Recent Labs    07/07/21 0326 07/08/21 0721 07/11/21 0405 07/12/21 0327  NA 132* 131* 129* 129*  K 3.6 3.7 3.6 3.5  CL 98 95* 97* 98  CO2 25 26 26 24   GLUCOSE 97 100* 89 91  BUN 11 9 10 13   CALCIUM 8.8* 8.8* 8.6* 8.4*  CREATININE 0.49 0.40* 0.45 0.34*  GFRNONAA >60 >60 >60 >60    LIVER FUNCTION TESTS: Recent Labs    04/13/21 1655 04/14/21 0325 07/05/21 0244 07/08/21 0721  BILITOT 0.6 0.6 0.9 0.7  AST 60* 51* 67* 44*  ALT 71* 59* 52* 40  ALKPHOS 636* 549* 606* 544*  PROT 6.8 5.8* 6.9 6.6  ALBUMIN 3.5 2.7* 3.2* 2.7*    Assessment and Plan:  T1 and T6 fractures.  S/P vertebral augmentation at T6 by Dr. Karenann Cai  The T1 vertebral body was not well visualized on lateral fluoroscopy due to shoulders superimposition.  Multiple attempts made to position the patient in a more favorable way proved unsuccessful.  Recommend PT ASAP to get patient mobile.   Electronically Signed: Murrell Redden, PA-C 07/12/2021, 12:38 PM    I spent a total of 15 Minutes at the the patient's bedside AND on the patient's hospital floor or unit, greater than 50% of which was counseling/coordinating care for f/u T6 KP.

## 2021-07-12 NOTE — Evaluation (Signed)
Occupational Therapy Evaluation Patient Details Name: Natasha Harper MRN: [id number] DOB: [birth year] Today's Date: 07/12/2021   History of Present Illness 87 yo female presents to ED on 12/29 with x2 falls, back pain sustaining T1, T6 thoracic compression fractures. Pt also with elevated troponins, NSTEMI, cards following. CTH negative for acute findings. s/p FLUOROSCOPY GUIDED T6 CORE BONE AND BALLOON KYPHOPLASTY  on 1/5. PMH includes CAD with angioplasty 1993, HTN, osteoporosis.   Clinical Impression   Patient is s/p kyphoplasty surgery resulting in functional limitations due to the deficits listed below (see OT problem list). Pt with hx of falls and recent back surgery this admission due to fall. Pt progressing with encouragement.  Patient will benefit from skilled OT acutely to increase independence and safety with ADLS to allow discharge CIR.       Recommendations for follow up therapy are one component of a multi-disciplinary discharge planning process, led by the attending physician.  Recommendations may be updated based on patient status, additional functional criteria and insurance authorization.   Follow Up Recommendations  Acute inpatient rehab (3hours/day)    Assistance Recommended at Discharge Intermittent Supervision/Assistance  Patient can return home with the following A little help with walking and/or transfers;A little help with bathing/dressing/bathroom;Assistance with cooking/housework;Direct supervision/assist for medications management;Assist for transportation    Functional Status Assessment  Patient has had a recent decline in their functional status and demonstrates the ability to make significant improvements in function in a reasonable and predictable amount of time.  Equipment Recommendations  BSC/3in1    Recommendations for Other Services Rehab consult     Precautions / Restrictions Precautions Precautions: Fall Precaution Comments: reviewed spinal  precautions      Mobility Bed Mobility Overal bed mobility: Needs Assistance Bed Mobility: Rolling;Sidelying to Sit;Sit to Sidelying Rolling: Min assist Sidelying to sit: Min assist;+2 for physical assistance     Sit to sidelying: Mod assist;+2 for physical assistance General bed mobility comments: min assist for log roll to EOB, +2 for simultaneous trunk elevation and LE lowering over EOB. Increased assist for return to supine for lifting LEs, boost up in bed, sequencing log roll.    Transfers Overall transfer level: Needs assistance Equipment used: Rolling walker (2 wheels) Transfers: Sit to/from Stand;Bed to chair/wheelchair/BSC Sit to Stand: Min assist;+2 physical assistance     Step pivot transfers: Min assist;+2 physical assistance;+2 safety/equipment     General transfer comment: min +2 for power up, rise, steadying, and pivotal steps to [facility] towards pt R. Additonal assist for lines/leads and RW management.      Balance Overall balance assessment: Needs assistance;History of Falls Sitting-balance support: No upper extremity supported;Feet supported Sitting balance-[name]: Fair     Standing balance support: Bilateral upper extremity supported;During functional activity;Reliant on assistive device for balance Standing balance-[name]: Poor                             ADL either performed or assessed with clinical judgement   ADL Overall ADL's : Needs assistance/impaired Eating/Feeding: Minimal assistance;Bed level   Grooming: Wash/dry hands;Wash/dry face;Sitting                   Toilet Transfer: +2 for physical assistance;Minimal assistance;Ambulation;BSC/3in1;Stand-pivot   Toileting- Architect and Hygiene: Total assistance Toileting - Clothing Manipulation Details (indicate cue type and reason): static standing for peri care     Functional mobility during ADLs: Rolling walker (2 wheels)  Vision          Perception     Praxis      Pertinent Vitals/Pain Pain Assessment: Faces Faces Pain Scale: Hurts little more Pain Location: back and sternum, during bed mobility Pain Descriptors / Indicators: Discomfort;Grimacing Pain Intervention(s): Monitored during session;Premedicated before session;Repositioned     Hand Dominance Right   Extremity/Trunk Assessment Upper Extremity Assessment Upper Extremity Assessment: Generalized weakness   Lower Extremity Assessment Lower Extremity Assessment: Generalized weakness   Cervical / Trunk Assessment Cervical / Trunk Assessment: Kyphotic   Communication Communication Communication: HOH   Cognition Arousal/Alertness: Awake/alert Behavior During Therapy: WFL for tasks assessed/performed Overall Cognitive Status: Impaired/Different from baseline                                 General Comments: pt follows commands well when she hears them, requires cuing for safety throughout mobility. pt requesting     General Comments  VSS, redness in sacral area noted to have color changes    Exercises     Shoulder Instructions      Home Living Family/patient expects to be discharged to:: Private residence Living Arrangements: Spouse/significant other Available Help at Discharge: Family;Available 24 hours/day Type of Home: House Home Access: Level entry     Home Layout: Two level;Able to live on main level with bedroom/bathroom     Bathroom Shower/Tub: Producer, television/film/videoWalk-in shower   Bathroom Toilet: Standard     Home Equipment: Agricultural consultantolling Walker (2 wheels);Shower seat;Hand held shower head (lift chair)   Additional Comments: spouse has a hernia and a "bad knee" so can provide only limited assistance      Prior Functioning/Environment Prior Level of Function : Needs assist             Mobility Comments: pt requiring supervision for mobility, uses RW ADLs Comments: pt's spouse reports pt needs assist with wash up, per pt would like  to be as independent as possible        OT Problem List: Decreased strength;Impaired balance (sitting and/or standing);Decreased activity tolerance      OT Treatment/Interventions: Self-care/ADL training;Therapeutic exercise;Neuromuscular education;Energy conservation;DME and/or AE instruction;Manual therapy;Therapeutic activities;Patient/family education;Balance training    OT Goals(Current goals can be found in the care plan section) Acute Rehab OT Goals Patient Stated Goal: to get stronger OT Goal Formulation: With patient/family Time For Goal Achievement: 07/26/21 Potential to Achieve Goals: Good  OT Frequency: Min 2X/week    Co-evaluation PT/OT/SLP Co-Evaluation/Treatment: Yes Reason for Co-Treatment: For patient/therapist safety;Necessary to address cognition/behavior during functional activity;To address functional/ADL transfers PT goals addressed during session: Mobility/safety with mobility;Balance;Proper use of DME OT goals addressed during session: Proper use of Adaptive equipment and DME;ADL's and self-care;Strengthening/ROM      AM-PAC OT "6 Clicks" Daily Activity     Outcome Measure Help from another person eating meals?: A Little Help from another person taking care of personal grooming?: A Little Help from another person toileting, which includes using toliet, bedpan, or urinal?: A Little Help from another person bathing (including washing, rinsing, drying)?: A Little Help from another person to put on and taking off regular upper body clothing?: A Little Help from another person to put on and taking off regular lower body clothing?: A Lot 6 Click Score: 17   End of Session Equipment Utilized During Treatment: Rolling walker (2 wheels) Nurse Communication: Mobility status;Precautions  Activity Tolerance: Patient tolerated treatment well Patient left: in bed;with call  bell/phone within reach;with bed alarm set  OT Visit Diagnosis: Unsteadiness on feet  (R26.81);Muscle weakness (generalized) (M62.81)                Time: 1610-9604 OT Time Calculation (min): 30 min Charges:  OT General Charges $OT Visit: 1 Visit OT Evaluation $OT Eval Moderate Complexity: 1 Mod   Brynn, OTR/L  Acute Rehabilitation Services Pager: [phone] Office: [phone] .   Mateo Flow 07/12/2021, 1:48 PM

## 2021-07-13 ENCOUNTER — Other Ambulatory Visit: Payer: Self-pay

## 2021-07-13 DIAGNOSIS — R778 Other specified abnormalities of plasma proteins: Secondary | ICD-10-CM | POA: Diagnosis not present

## 2021-07-13 DIAGNOSIS — S22050A Wedge compression fracture of T5-T6 vertebra, initial encounter for closed fracture: Secondary | ICD-10-CM | POA: Diagnosis not present

## 2021-07-13 DIAGNOSIS — I248 Other forms of acute ischemic heart disease: Secondary | ICD-10-CM | POA: Diagnosis not present

## 2021-07-13 DIAGNOSIS — I10 Essential (primary) hypertension: Secondary | ICD-10-CM | POA: Diagnosis not present

## 2021-07-13 LAB — BASIC METABOLIC PANEL
Anion gap: 5 (ref 5–15)
BUN: 14 mg/dL (ref 8–23)
CO2: 24 mmol/L (ref 22–32)
Calcium: 8.8 mg/dL — ABNORMAL LOW (ref 8.9–10.3)
Chloride: 100 mmol/L (ref 98–111)
Creatinine, Ser: 0.37 mg/dL — ABNORMAL LOW (ref 0.44–1.00)
GFR, Estimated: >60 mL/min (ref >60)
Glucose, Bld: 98 mg/dL (ref 70–99)
Potassium: 3.5 mmol/L (ref 3.5–5.1)
Sodium: 129 mmol/L — ABNORMAL LOW (ref 135–145)

## 2021-07-13 MED ORDER — ATORVASTATIN CALCIUM 80 MG PO TABS
80.0000 mg | ORAL_TABLET | Freq: Every day | ORAL | Status: DC
Start: 2021-07-13 — End: 2021-07-13

## 2021-07-13 MED ORDER — LEVOTHYROXINE SODIUM 50 MCG PO TABS
50.0000 ug | ORAL_TABLET | Freq: Every day | ORAL | Status: DC
  Administered 2021-07-13 – 2021-07-24 (×12): 50 ug via ORAL
  Filled 2021-07-13 (×12): qty 1

## 2021-07-13 MED ORDER — FUROSEMIDE 10 MG/ML IJ SOLN
20.0000 mg | Freq: Once | INTRAMUSCULAR | Status: AC
  Administered 2021-07-13: 20 mg via INTRAVENOUS
  Filled 2021-07-13: qty 2

## 2021-07-13 MED ORDER — ASPIRIN 81 MG PO TABS: 81.0000 mg | ORAL_TABLET | Freq: Every day | ORAL | Status: DC

## 2021-07-13 NOTE — Progress Notes (Signed)
Per telemetry, pt had a 5 beat run of nonsubstained SVT run; stripped saved.

## 2021-07-13 NOTE — Progress Notes (Signed)
PROGRESS NOTE  Natasha Harper EHU:[id number] DOB: 1935/03/09 DOA: 07/04/2021 PCP: [name], MD  Brief History   Patient is a 87 year old female with history of hypothyroidism, hyperlipidemia, coronary artery disease, hypertension who presented with back pain and falls from home.  She fell at home and is unable to get up.  On presentation CT of the thoracic spine showed T1 and T6 compression fracture, lab work showed elevated troponin.  Started on heparin drip on admission now discontinued by cardiology because elevated troponin thought to be from demand ischemia.  Waiting for IR guided kyphoplasty.  PT/OT will be consulted after kyphoplasty.  The patient went for kyphoplasty. Unfortunately IR was not able to visualize T1 well enough for kyphoplasty to be performed despite multiple attempts to reposition her. However, the procedure was successful as performed on T6.   The patient has been evaluated by PT/OT. She is felt to be a candidate for CIR. Rehab has been consulted to further evaluate her.  Consultants  Interventional radiology Cardiology  Procedures  Kyphoplasty`  Antibiotics   Anti-infectives (From admission, onward)    Start     Dose/Rate Route Frequency Ordered Stop   07/11/21 0800  ceFAZolin (ANCEF) IVPB 2g/100 mL premix        2 g 200 mL/hr over 30 Minutes Intravenous To Radiology 07/10/21 1055 07/11/21 0900      Subjective  The patient is resting quietly.No new complaints.   Objective   Vitals:  Vitals:   07/13/21 1156 07/13/21 1514  BP: 112/61 134/61  Pulse: 71 72  Resp: 17 14  Temp: 98.1 F (36.7 C) 98.7 F (37.1 C)  SpO2: 95% 93%    Exam:  Constitutional:  The patient is awake, alert, and oriented x 3. No acute distress. Respiratory:  No increased work of breathing. No wheezes, rales, or rhonchi No tactile fremitus Cardiovascular:  Regular rate and rhythm No murmurs, ectopy, or gallups. No lateral PMI. No thrills. Abdomen:  Abdomen  is soft, non-tender, non-distended No hernias, masses, or organomegaly Normoactive bowel sounds.  Musculoskeletal:  No cyanosis, clubbing, or edema Skin:  No rashes, lesions, ulcers palpation of skin: no induration or nodules Neurologic:  CN 2-12 intact Sensation all 4 extremities intact Psychiatric:  Mental status Mood, affect appropriate Orientation to person, place, time  judgment and insight appear intact  I have personally reviewed the following:   Today's Data  Vitals  Lab Data  BMP CBC  Micro Data  MRSA negative  Imaging  CTA chest CT head without contrast CT lumbar and thoracic spine  Cardiology Data  EKG Echocardiogram  Other Data    Scheduled Meds:  amLODipine  10 mg Oral Daily   aspirin EC  81 mg Oral Daily   atorvastatin  80 mg Oral Daily   heparin  5,000 Units Subcutaneous Q8H   levothyroxine  50 mcg Oral Q0600   losartan  50 mg Oral Daily   mouth rinse  15 mL Mouth Rinse BID   metoprolol tartrate  12.5 mg Oral BID   polyethylene glycol  17 g Oral Daily   sodium chloride  1 g Oral BID WC    Principal Problem:   Thoracic compression fracture (HCC) Active Problems:   Elevated troponin   Demand ischemia (HCC)   Primary hypertension   LOS: 8 days   A & P  Fall/compression fracture: CT thoracic spine showed T1, T6 compression fracture with 10 to 20% height loss with no retropulsion or spinal canal stenosis.  Continue pain management.  IR has been consulted for the consideration of kyphoplasty. The patient went for kyphoplasty on 07/11/2020. Unfortunately IR was not able to visualize T1 well enough for kyphoplasty to be performed despite multiple attempts to reposition her. However, the procedure was successful as performed on T6. The patient has been evaluated by PT/OT. He is felt to be a candidate for CIR. Rehab has been consulted to further evaluate her.  NSTEMI/history of coronary artery disease: Troponin was elevated troponin in the range of  2K but flat trended  She has history of coronary artery disease status post PCI to RCA and diagonal in 1993 .  Has residual 70% LAD lesion. Echo done here showed left ventricular ejection fraction of 55-60%, normal regional wall motion.  Elevated troponin felt to be from demand ischemia.  Patient currently denies any chest pain.  Heparin drip discontinued.  No plans for cardiac cath.  Cardiology was following. No complaint of chest pain but she has sternal tenderness likely related to fall.   Hypertension: Pt is currently normotensive on losartan and metoprolol which have been continued as he takes them at home and amlodipine which was added  as inpatient to better control his blood pressure.  Monitor.     Hyperlipidemia: On Lipitor   Right breast abnormal imaging: As seen on CTA: 2X 1.9 cm high-attenuation lesion in the right. She has breast lumps for years and was following with GYN, had mammograms, excisional biopsy of one of her breast was found to be benign.  She does not want further investigation.  If she wishes, outpatient mammogram/breast ultrasound recommended as outpatient to exclude neoplasm of breast   Elevated ALP: GGT elevated, ALP elevated.  Abdominal ultrasound did not show any abnormality.  Could be associated with pathology with bones with possible underlying breast cancer.  Can be monitored as an outpatient.   Macrocytic anemia: Currently hemoglobin stable in the range of 9.  Iron studies showed normal iron.  Normal vitamin B12 and folic acid   Hyponatremia: Mild, stable, continue to monitor. Pt is a little volume overloaded. Lasix one time given with NaCl tablets. Sodium is stable at 129. Monitor.   I have seen and examined this patient myself. I have spent 32 minutes in her evaluation and care.   Raechelle Sarti, DO [facility] Direct contact: see [email or web address]  7PM-7AM contact night coverage as above 07/13/2021, 6:04 PM  LOS: 6 days

## 2021-07-14 DIAGNOSIS — S22050A Wedge compression fracture of T5-T6 vertebra, initial encounter for closed fracture: Secondary | ICD-10-CM | POA: Diagnosis not present

## 2021-07-14 DIAGNOSIS — I248 Other forms of acute ischemic heart disease: Secondary | ICD-10-CM | POA: Diagnosis not present

## 2021-07-14 DIAGNOSIS — R778 Other specified abnormalities of plasma proteins: Secondary | ICD-10-CM | POA: Diagnosis not present

## 2021-07-14 DIAGNOSIS — I10 Essential (primary) hypertension: Secondary | ICD-10-CM | POA: Diagnosis not present

## 2021-07-14 NOTE — Progress Notes (Signed)
PROGRESS NOTE  Natasha Harper XTK:[id number] DOB: 07-Dec-1934 DOA: 07/04/2021 PCP: [name], MD  Brief History   Patient is a 87 year old female with history of hypothyroidism, hyperlipidemia, coronary artery disease, hypertension who presented with back pain and falls from home.  She fell at home and is unable to get up.  On presentation CT of the thoracic spine showed T1 and T6 compression fracture, lab work showed elevated troponin.  Started on heparin drip on admission now discontinued by cardiology because elevated troponin thought to be from demand ischemia.  Waiting for IR guided kyphoplasty.  PT/OT will be consulted after kyphoplasty.  The patient went for kyphoplasty. Unfortunately IR was not able to visualize T1 well enough for kyphoplasty to be performed despite multiple attempts to reposition her. However, the procedure was successful as performed on T6.   The patient has been evaluated by PT/OT. She is felt to be a candidate for CIR. Rehab has been consulted to further evaluate her.  Consultants  Interventional radiology Cardiology  Procedures  Kyphoplasty`  Antibiotics   Anti-infectives (From admission, onward)    Start     Dose/Rate Route Frequency Ordered Stop   07/11/21 0800  ceFAZolin (ANCEF) IVPB 2g/100 mL premix        2 g 200 mL/hr over 30 Minutes Intravenous To Radiology 07/10/21 1055 07/11/21 0900      Subjective  The patient is resting quietly. No new complaints.   Objective   Vitals:  Vitals:   07/14/21 1157 07/14/21 1432  BP: 131/66 129/62  Pulse: 76 78  Resp: 20 18  Temp: 98 F (36.7 C) 98.5 F (36.9 C)  SpO2: 96% 99%    Exam:  Constitutional:  The patient is awake, alert, and oriented x 3. No acute distress. Respiratory:  No increased work of breathing. No wheezes, rales, or rhonchi No tactile fremitus Cardiovascular:  Regular rate and rhythm No murmurs, ectopy, or gallups. No lateral PMI. No thrills. Abdomen:  Abdomen is  soft, non-tender, non-distended No hernias, masses, or organomegaly Normoactive bowel sounds.  Musculoskeletal:  No cyanosis, clubbing, or edema Skin:  No rashes, lesions, ulcers palpation of skin: no induration or nodules Neurologic:  CN 2-12 intact Sensation all 4 extremities intact Psychiatric:  Mental status Mood, affect appropriate Orientation to person, place, time  judgment and insight appear intact  I have personally reviewed the following:   Today's Data  Vitals  Lab Data  BMP CBC  Micro Data  MRSA negative  Imaging  CTA chest CT head without contrast CT lumbar and thoracic spine  Cardiology Data  EKG Echocardiogram  Other Data    Scheduled Meds:  amLODipine  10 mg Oral Daily   aspirin EC  81 mg Oral Daily   atorvastatin  80 mg Oral Daily   heparin  5,000 Units Subcutaneous Q8H   levothyroxine  50 mcg Oral Q0600   losartan  50 mg Oral Daily   mouth rinse  15 mL Mouth Rinse BID   metoprolol tartrate  12.5 mg Oral BID   polyethylene glycol  17 g Oral Daily   sodium chloride  1 g Oral BID WC    Principal Problem:   Thoracic compression fracture (HCC) Active Problems:   Elevated troponin   Demand ischemia (HCC)   Primary hypertension   LOS: 9 days   A & P  Fall/compression fracture: CT thoracic spine showed T1, T6 compression fracture with 10 to 20% height loss with no retropulsion or spinal canal  stenosis.  Continue pain management.  IR has been consulted for the consideration of kyphoplasty. The patient went for kyphoplasty on 07/11/2020. Unfortunately IR was not able to visualize T1 well enough for kyphoplasty to be performed despite multiple attempts to reposition her. However, the procedure was successful as performed on T6. The patient has been evaluated by PT/OT. She is felt to be a candidate for CIR. Rehab has been consulted to further evaluate her.  NSTEMI/history of coronary artery disease: Troponin was elevated troponin in the range of  2K but flat trended  She has history of coronary artery disease status post PCI to RCA and diagonal in 1993 .  Has residual 70% LAD lesion. Echo done here showed left ventricular ejection fraction of 55-60%, normal regional wall motion.  Elevated troponin felt to be from demand ischemia.  Patient currently denies any chest pain.  Heparin drip discontinued.  No plans for cardiac cath.  Cardiology was following. No complaint of chest pain but she has sternal tenderness likely related to fall.   Hypertension: Pt is currently normotensive on losartan and metoprolol which have been continued as he takes them at home and amlodipine which was added  as inpatient to better control his blood pressure.  Monitor.     Hyperlipidemia: On Lipitor   Right breast abnormal imaging: As seen on CTA: 2X 1.9 cm high-attenuation lesion in the right. She has breast lumps for years and was following with GYN, had mammograms, excisional biopsy of one of her breast was found to be benign.  She does not want further investigation.  If she wishes, outpatient mammogram/breast ultrasound recommended as outpatient to exclude neoplasm of breast   Elevated ALP: GGT elevated, ALP elevated.  Abdominal ultrasound did not show any abnormality.  Could be associated with pathology with bones with possible underlying breast cancer.  Can be monitored as an outpatient.   Macrocytic anemia: Currently hemoglobin stable in the range of 9.  Iron studies showed normal iron.  Normal vitamin B12 and folic acid   Hyponatremia: Mild, stable, continue to monitor. Pt is a little volume overloaded. Lasix one time given with NaCl tablets. Sodium is stable at 129. Monitor.   I have seen and examined this patient myself. I have spent 32 minutes in her evaluation and care.   Natasha Breuer, DO [facility] Direct contact: see [email or web address]  7PM-7AM contact night coverage as above 07/14/2021, 5:44 PM  LOS: 6 days

## 2021-07-14 NOTE — PMR Pre-admission (Shared)
PMR Admission Coordinator Pre-Admission Assessment  Patient: GWENDY BOEDER is an 87 y.o., female MRN: [id number] DOB: [birth year] Height: 5\' 1"  (154.9 cm) Weight: 46.6 kg  Insurance Information HMO: ***    PPO: ***     PCP: ***     IPA: ***     80/20: ***     OTHER: *** PRIMARY: Blue Medicare       Policy#: [id number]      Subscriber: *** CM Name: ***      Phone#: ***     Fax#: *** Pre-Cert#: ***      Employer: *** Benefits:  Phone #: ***     Name: *** Eff. Date: ***     Deduct: ***      Out of Pocket Max: ***      Life Max: *** CIR: ***      SNF: *** Outpatient: ***     Co-Pay: *** Home Health: ***      Co-Pay: *** DME: ***     Co-Pay: *** Providers: *** SECONDARY:       Policy#:      Phone#:   Financial Counselor:       Phone#:   The [employer] for patients in Inpatient Rehabilitation Facilities with attached Privacy Act [place] Records was provided and verbally reviewed with: Patient  Emergency Contact Information Contact Information     Name Relation Home Work [place] Spouse [phone]       Current Medical History  Patient Admitting Diagnosis: T1, T6 compression fractures, s/p kypoplasty   History of Present Illness: KISTA ROBB is a 87 y.o. female with medical history significant of hypothyroidism, HLD, CAD, HTN, ACD. Presented to ED 07/05/21 with back pain and falls.Pt. reportedly fell twice the day of admission and after the second fall, she reports that she had some substernal chest pain that was painful w/ touch.Family became concerned and had her brought to the ED for evaluation.  CT was notable for T1, T6 compression fractures. Trp was elevated and continued to rise. Cardiology was consulted. Pt was placed on heparin gtt. The patient went for kyphoplasty with IR 07/11/21. Unfortunately IR was not able to visualize T1 well enough for kyphoplasty to be performed despite multiple  attempts to reposition her. However, the procedure was successful as performed on T6.  CIR was consulted to assist return to PLOF.     Patient's medical record from [facility] has been reviewed by the rehabilitation admission coordinator and physician.  Past Medical History  Past Medical History:  Diagnosis Date   CAD (coronary artery disease)    PCI to RCA and diagonal in remote past, residual 70% LAD  /   nuclear, 2007, no ischemia   Carotid arterial disease ([place])    Doppler, June, 2011, stable, 62-69% R. ICA, 48-54% LICA   Contact lens/glasses fitting    wears contacts or glasses   Dyslipidemia    Significant drop in LDL from Lipitor even though LDL remains high   Ejection fraction    EF 65%, nuclear, 2007   Fibrocystic breast    HTN (hypertension)     no med   Hypercholesterolemia    Hypothyroidism    Thyroid surgery in the past, thyroid nodules followed by Dr.Ellison   Lump or mass in breast 07/19/2007   Excised 01/12/13. B9 on pathQualifier: Diagnosis of  By: Regis Bill MD, Standley Brooking     Osteoporosis  Prominent abdominal aortic pulsation    No abdominal aneurysm by ultrasound   Rectal fissure     Has the patient had major surgery during 100 days prior to admission? Yes  Family History   family history includes Breast cancer in her daughter; Heart attack in her father; Osteoporosis in her mother.  Current Medications  Current Facility-Administered Medications:    acetaminophen (TYLENOL) tablet 650 mg, 650 mg, Oral, Q6H PRN, 650 mg at 07/14/21 0555 **OR** acetaminophen (TYLENOL) suppository 650 mg, 650 mg, Rectal, Q6H PRN, Marylyn Ishihara, Tyrone A, DO   amLODipine (NORVASC) tablet 10 mg, 10 mg, Oral, Daily, Adhikari, Amrit, MD, 10 mg at 07/14/21 0800   aspirin EC tablet 81 mg, 81 mg, Oral, Daily, Kyle, Tyrone A, DO, 81 mg at 07/14/21 0800   atorvastatin (LIPITOR) tablet 80 mg, 80 mg, Oral, Daily, Kyle, Tyrone A, DO, 80 mg at 07/14/21 0801   bupivacaine (MARCAINE) 0.5  % injection, , , PRN, de Sindy Messing, [place], MD, 10 mL at 07/11/21 0848   fentaNYL (SUBLIMAZE) injection 50 mcg, 50 mcg, Intravenous, Q2H PRN, Marylyn Ishihara, Tyrone A, DO, 50 mcg at 07/05/21 1605   fentaNYL (SUBLIMAZE) injection, , Intravenous, PRN, de Sindy Messing, [place], MD, 12.5 mcg at 07/11/21 0918   heparin injection 5,000 Units, 5,000 Units, Subcutaneous, Q8H, Dang, Thuy D, RPH, 5,000 Units at 07/14/21 0554   hydrALAZINE (APRESOLINE) injection 10 mg, 10 mg, Intravenous, Q6H PRN, Shelly Coss, MD, 10 mg at 07/08/21 1847   labetalol (NORMODYNE) injection, , , PRN, de Sindy Messing, [place], MD, 5 mg at 07/11/21 0941   levothyroxine (SYNTHROID) tablet 50 mcg, 50 mcg, Oral, Q0600, Swayze, Ava, DO, 50 mcg at 07/14/21 0555   lidocaine (PF) (XYLOCAINE) 1 % injection, , , PRN, de Sindy Messing, [place], MD, 10 mL at 07/11/21 0848   losartan (COZAAR) tablet 50 mg, 50 mg, Oral, Daily, Nahser, Wonda Cheng, MD, 50 mg at 07/14/21 0801   MEDLINE mouth rinse, 15 mL, Mouth Rinse, BID, Adhikari, Amrit, MD, 15 mL at 07/13/21 2123   metoprolol tartrate (LOPRESSOR) injection 5 mg, 5 mg, Intravenous, Q6H PRN, Marylyn Ishihara, Tyrone A, DO, 5 mg at 07/08/21 0416   metoprolol tartrate (LOPRESSOR) tablet 12.5 mg, 12.5 mg, Oral, BID, Bhagat, Bhavinkumar, PA, 12.5 mg at 07/14/21 0801   midazolam (VERSED) injection, , Intravenous, PRN, de Sindy Messing, Erven Colla, MD, 0.5 mg at 07/11/21 0926   naloxone [facility]) injection 0.4 mg, 0.4 mg, Intravenous, PRN, Marylyn Ishihara, Tyrone A, DO   ondansetron (ZOFRAN) injection 4 mg, 4 mg, Intravenous, Q6H PRN, Marylyn Ishihara, Tyrone A, DO   polyethylene glycol (MIRALAX / GLYCOLAX) packet 17 g, 17 g, Oral, Daily, Adhikari, Amrit, MD, 17 g at 07/09/21 0813   sodium chloride tablet 1 g, 1 g, Oral, BID WC, Swayze, Ava, DO, 1 g at 07/14/21 0755  Patients Current Diet:  Diet Order             Diet Heart Room service appropriate? Yes with Assist; Fluid consistency: Thin  Diet effective now                    Precautions / Restrictions Precautions Precautions: Fall Precaution Comments: reviewed spinal precautions Restrictions Weight Bearing Restrictions: No   Has the patient had 2 or more falls or a fall with injury in the past year? Yes  Prior Activity Level Community (5-7x/wk): Pt. active in the community PTA  Prior Functional Level Self Care: Did the patient need help bathing, dressing, using the toilet or  eating? Independent  Indoor Mobility: Did the patient need assistance with walking from room to room (with or without device)? Independent  Stairs: Did the patient need assistance with internal or external stairs (with or without device)? Independent  Functional Cognition: Did the patient need help planning regular tasks such as shopping or remembering to take medications? Independent  Patient Information Are you of Hispanic, Latino/a,or Spanish origin?: A. No, not of Hispanic, Latino/a, or Spanish origin What is your race?: A. White Do you need or want an interpreter to communicate with a doctor or health care staff?: 0. No  Patient's Response To:  Health Literacy and Transportation Is the patient able to respond to health literacy and transportation needs?: Yes Health Literacy - How often do you need to have someone help you when you read instructions, pamphlets, or other written material from your doctor or pharmacy?: Never In the past 12 months, has lack of transportation kept you from medical appointments or from getting medications?: No In the past 12 months, has lack of transportation kept you from meetings, work, or from getting things needed for daily living?: No  Home Assistive Devices / [place] Devices/Equipment: Contact lenses, Other (Comment), Walker (specify type) (walk-in shower) Home Equipment: Rolling Walker (2 wheels), Shower seat, Hand held shower head (lift chair)  Prior Device Use: Indicate devices/aids used by the patient  prior to current illness, exacerbation or injury? Walker  Current Functional Level Cognition  Overall Cognitive Status: Impaired/Different from baseline Difficult to assess due to: Hard of hearing/deaf Orientation Level: Oriented X4 General Comments: pt follows commands well when she hears them, requires cuing for safety throughout mobility. pt requesting    Extremity Assessment (includes Sensation/Coordination)  Upper Extremity Assessment: Generalized weakness  Lower Extremity Assessment: Generalized weakness    ADLs  Overall ADL's : Needs assistance/impaired Eating/Feeding: Minimal assistance, Bed level Grooming: Wash/dry hands, Wash/dry face, Sitting Toilet Transfer: +2 for physical assistance, Minimal assistance, Ambulation, BSC/3in1, Stand-pivot Toileting- Clothing Manipulation and Hygiene: Total assistance Toileting - Clothing Manipulation Details (indicate cue type and reason): static standing for peri care Functional mobility during ADLs: Rolling walker (2 wheels)    Mobility  Overal bed mobility: Needs Assistance Bed Mobility: Rolling, Sidelying to Sit, Sit to Sidelying Rolling: Min assist Sidelying to sit: Min assist, +2 for physical assistance Sit to sidelying: Mod assist, +2 for physical assistance General bed mobility comments: min assist for log roll to EOB, +2 for simultaneous trunk elevation and LE lowering over EOB. Increased assist for return to supine for lifting LEs, boost up in bed, sequencing log roll.    Transfers  Overall transfer level: Needs assistance Equipment used: Rolling walker (2 wheels) Transfers: Sit to/from Stand, Bed to chair/wheelchair/BSC Sit to Stand: Min assist, +2 physical assistance Bed to/from chair/wheelchair/BSC transfer type:: Step pivot Step pivot transfers: Min assist, +2 physical assistance, +2 safety/equipment General transfer comment: min +2 for power up, rise, steadying, and pivotal steps to [facility] towards pt R. Additonal assist  for lines/leads and RW management.    Ambulation / Gait / Stairs / Wheelchair Mobility  Ambulation/Gait General Gait Details: pt fatigued    Posture / Balance Balance Overall balance assessment: Needs assistance, History of Falls Sitting-balance support: No upper extremity supported, Feet supported Sitting balance-[name]: Fair Standing balance support: Bilateral upper extremity supported, During functional activity, Reliant on assistive device for balance Standing balance-[name]: Poor    Special needs/care consideration Skin ***   Previous Home Environment (from acute therapy documentation)  Living Arrangements: Spouse/significant other  Lives With: Spouse Available Help at Discharge: Family, Available 24 hours/day Type of Home: House Home Layout: Two level, Able to live on main level with bedroom/bathroom Home Access: Level entry Bathroom Shower/Tub: Multimedia programmer: Standard Home Care Services: No Additional Comments: spouse has a hernia and a "bad knee" so can provide only limited assistance  Discharge Living Setting Plans for Discharge Living Setting: Patient's home Type of Home at Discharge: House Discharge Home Layout: Two level, Able to live on main level with bedroom/bathroom Alternate Level Stairs-Number of Steps: full flight Discharge Home Access: Level entry Discharge Bathroom Shower/Tub: Walk-in shower Discharge Bathroom Toilet: Standard Discharge Bathroom Accessibility: No Does the patient have any problems obtaining your medications?: No  Social/Family/Support Systems Patient Roles: Spouse Contact Information: [phone] Anticipated Caregiver: Collier [place] (spouse) Anticipated Caregiver's Contact Information: Can provide supervision only Caregiver Availability: 24/7 Discharge Plan Discussed with Primary Caregiver: Yes Is Caregiver In Agreement with Plan?: Yes  Goals Patient/Family Goal for Rehab: PT/OT Supervision Expected length of  stay: 12-14 days Pt/Family Agrees to Admission and willing to participate: Yes Program Orientation Provided & Reviewed with Pt/Caregiver Including Roles  & Responsibilities: No  Decrease burden of Care through IP rehab admission: Specialzed equipment needs, Decrease number of caregivers, Bowel and bladder program, and Patient/family education  Possible need for SNF placement upon discharge: not anticipated   Patient Condition: I have reviewed medical records from [facility] , spoken with CM, and patient and spouse. I met with patient at the bedside and discussed via phone for inpatient rehabilitation assessment.  Patient will benefit from ongoing PT and OT, can actively participate in 3 hours of therapy a day 5 days of the week, and can make measurable gains during the admission.  Patient will also benefit from the coordinated team approach during an Inpatient Acute Rehabilitation admission.  The patient will receive intensive therapy as well as Rehabilitation physician, nursing, social worker, and care management interventions.  Due to safety, skin/wound care, disease management, medication administration, pain management, and patient education the patient requires 24 hour a day rehabilitation nursing.  The patient is currently *** with mobility and basic ADLs.  Discharge setting and therapy post discharge at home with home health is anticipated.  Patient has agreed to participate in the Acute Inpatient Rehabilitation Program and will admit {Time; today/tomorrow:10263}.  Preadmission Screen Completed By:  Genella Mech, 07/14/2021 10:00 AM ______________________________________________________________________   Discussed status with Dr. Marland Kitchen on *** at *** and received approval for admission today.  Admission Coordinator:  Genella Mech, CCC-SLP, time Marland KitchenSudie Grumbling ***   Assessment/Plan: Diagnosis: Does the need for close, 24 hr/day Medical supervision in concert with the patient's  rehab needs make it unreasonable for this patient to be served in a less intensive setting? {yes_no_potentially:3041433} Co-Morbidities requiring supervision/potential complications: *** Due to {due NU:2725366}, does the patient require 24 hr/day rehab nursing? {yes_no_potentially:3041433} Does the patient require coordinated care of a physician, rehab nurse, PT, OT, and SLP to address physical and functional deficits in the context of the above medical diagnosis(es)? {yes_no_potentially:3041433} Addressing deficits in the following areas: {deficits:3041436} Can the patient actively participate in an intensive therapy program of at least 3 hrs of therapy 5 days a week? {yes_no_potentially:3041433} The potential for patient to make measurable gains while on inpatient rehab is {potential:3041437} Anticipated functional outcomes upon discharge from inpatient rehab: {functional outcomes:304600100} PT, {functional outcomes:304600100} OT, {functional outcomes:304600100} SLP Estimated rehab length of stay to reach the above  functional goals is: *** Anticipated discharge destination: {anticipated dc setting:21604} 10. Overall Rehab/Functional Prognosis: {potential:3041437}   MD Signature: ***

## 2021-07-15 DIAGNOSIS — I248 Other forms of acute ischemic heart disease: Secondary | ICD-10-CM | POA: Diagnosis not present

## 2021-07-15 DIAGNOSIS — R778 Other specified abnormalities of plasma proteins: Secondary | ICD-10-CM | POA: Diagnosis not present

## 2021-07-15 DIAGNOSIS — S22050A Wedge compression fracture of T5-T6 vertebra, initial encounter for closed fracture: Secondary | ICD-10-CM | POA: Diagnosis not present

## 2021-07-15 DIAGNOSIS — I10 Essential (primary) hypertension: Secondary | ICD-10-CM | POA: Diagnosis not present

## 2021-07-15 LAB — BASIC METABOLIC PANEL
Anion gap: 7 (ref 5–15)
BUN: 12 mg/dL (ref 8–23)
CO2: 26 mmol/L (ref 22–32)
Calcium: 8.6 mg/dL — ABNORMAL LOW (ref 8.9–10.3)
Chloride: 97 mmol/L — ABNORMAL LOW (ref 98–111)
Creatinine, Ser: 0.36 mg/dL — ABNORMAL LOW (ref 0.44–1.00)
GFR, Estimated: >60 mL/min (ref >60)
Glucose, Bld: 90 mg/dL (ref 70–99)
Potassium: 3.6 mmol/L (ref 3.5–5.1)
Sodium: 130 mmol/L — ABNORMAL LOW (ref 135–145)

## 2021-07-15 NOTE — Progress Notes (Signed)
Physical Therapy Treatment Patient Details Name: Natasha Harper MRN: [id number] DOB: [birth year] Today's Date: 07/15/2021   History of Present Illness 87 yo female presents to ED on 12/29 with x2 falls, back pain sustaining T1, T6 thoracic compression fractures. Pt also with elevated troponins, NSTEMI, cards following. CTH negative for acute findings. s/p FLUOROSCOPY GUIDED T6 CORE BONE AND BALLOON KYPHOPLASTY  on 1/5. PMH includes CAD with angioplasty 1993, HTN, osteoporosis.    PT Comments    Pt eager to mobilize OOB. Pt overall min physical assist for mobility, requires mod cuing throughout mobility for form and safety. Pt ambulatory for room distance with use of RW today, anticipate pt will progress well with intensive therapies post-acutely.     Recommendations for follow up therapy are one component of a multi-disciplinary discharge planning process, led by the attending physician.  Recommendations may be updated based on patient status, additional functional criteria and insurance authorization.  Follow Up Recommendations  Acute inpatient rehab (3hours/day)     Assistance Recommended at Discharge Frequent or constant Supervision/Assistance  Patient can return home with the following A little help with bathing/dressing/bathroom;A little help with walking and/or transfers;Assistance with cooking/housework;Assist for transportation   Equipment Recommendations  None recommended by PT    Recommendations for Other Services       Precautions / Restrictions Precautions Precautions: Fall Precaution Comments: reviewed log roll in/out of bed Restrictions Weight Bearing Restrictions: No     Mobility  Bed Mobility Overal bed mobility: Needs Assistance Bed Mobility: Rolling;Sidelying to Sit Rolling: Min assist Sidelying to sit: Min assist       General bed mobility comments: assist for trunk elevation, LE lowering over EOB, scooting to EOB.    Transfers Overall transfer  level: Needs assistance Equipment used: Rolling walker (2 wheels) Transfers: Sit to/from Stand Sit to Stand: Min assist           General transfer comment: assist for power up, steadying upon standing, cues for hand placement when rising/sitting.    Ambulation/Gait Ambulation/Gait assistance: Min assist Gait Distance (Feet): 40 Feet Assistive device: Rolling walker (2 wheels) Gait Pattern/deviations: Step-through pattern;Decreased stride length Gait velocity: decr     General Gait Details: assist to steady and guide RW, cues for use of RW, upright posture, "big steps"   Stairs             Wheelchair Mobility    Modified Rankin (Stroke Patients Only)       Balance Overall balance assessment: Needs assistance;History of Falls Sitting-balance support: No upper extremity supported;Feet supported Sitting balance-[name]: Fair     Standing balance support: Bilateral upper extremity supported;During functional activity;Reliant on assistive device for balance Standing balance-[name]: Poor                              Cognition Arousal/Alertness: Awake/alert Behavior During Therapy: WFL for tasks assessed/performed Overall Cognitive Status: No family/caregiver present to determine baseline cognitive functioning                                 General Comments: HOH        Exercises Other Exercises Other Exercises: STS x3 from recliner as intervention    General Comments        Pertinent Vitals/Pain Pain Assessment: Faces Faces Pain Scale: Hurts a little bit Pain Location: back Pain Descriptors / Indicators: Discomfort;Grimacing  Pain Intervention(s): Limited activity within patient's tolerance;Monitored during session;Repositioned    Home Living                          Prior Function            PT Goals (current goals can now be found in the care plan section) Acute Rehab PT Goals Patient Stated Goal:  independence PT Goal Formulation: With patient/family Time For Goal Achievement: 07/26/21 Potential to Achieve Goals: Good Progress towards PT goals: Progressing toward goals    Frequency    Min 3X/week      PT Plan Current plan remains appropriate    Co-evaluation              AM-PAC PT "6 Clicks" Mobility   Outcome Measure  Help needed turning from your back to your side while in a flat bed without using bedrails?: A Little Help needed moving from lying on your back to sitting on the side of a flat bed without using bedrails?: A Little Help needed moving to and from a bed to a chair (including a wheelchair)?: A Lot (cues) Help needed standing up from a chair using your arms (e.g., wheelchair or bedside chair)?: A Little Help needed to walk in hospital room?: A Lot (cues) Help needed climbing 3-5 steps with a railing? : A Lot 6 Click Score: 15    End of Session Equipment Utilized During Treatment: Gait belt Activity Tolerance: Patient tolerated treatment well;Patient limited by pain Patient left: with call bell/phone within reach;in chair;with chair alarm set Nurse Communication: Mobility status PT Visit Diagnosis: Other abnormalities of gait and mobility (R26.89);Difficulty in walking, not elsewhere classified (R26.2)     Time: 0109-3235 PT Time Calculation (min) (ACUTE ONLY): 16 min  Charges:  $Gait Training: 8-22 mins                     Marye Round, PT DPT Acute Rehabilitation Services Pager [phone]  Office [phone]    Tyrone Apple E Christain [place] 07/15/2021, 2:21 PM

## 2021-07-15 NOTE — Progress Notes (Signed)
PROGRESS NOTE  Natasha Harper DTO:[id number] DOB: Dec 03, 1934 DOA: 07/04/2021 PCP: [name], MD  Brief History   Patient is a 87 year old female with history of hypothyroidism, hyperlipidemia, coronary artery disease, hypertension who presented with back pain and falls from home.  She fell at home and is unable to get up.  On presentation CT of the thoracic spine showed T1 and T6 compression fracture, lab work showed elevated troponin.  Started on heparin drip on admission now discontinued by cardiology because elevated troponin thought to be from demand ischemia.  Waiting for IR guided kyphoplasty.  PT/OT will be consulted after kyphoplasty.  The patient went for kyphoplasty. Unfortunately IR was not able to visualize T1 well enough for kyphoplasty to be performed despite multiple attempts to reposition her. However, the procedure was successful as performed on T6.   The patient has been evaluated by PT/OT. She is felt to be a candidate for CIR. Rehab has been consulted to further evaluate her.  Consultants  Interventional radiology Cardiology  Procedures  Kyphoplasty`  Antibiotics   Anti-infectives (From admission, onward)    Start     Dose/Rate Route Frequency Ordered Stop   07/11/21 0800  ceFAZolin (ANCEF) IVPB 2g/100 mL premix        2 g 200 mL/hr over 30 Minutes Intravenous To Radiology 07/10/21 1055 07/11/21 0900      Subjective  The patient is resting quietly. No new complaints.   Objective   Vitals:  Vitals:   07/15/21 1216 07/15/21 1556  BP:  (!) 144/64  Pulse:  68  Resp:  18  Temp: 98.6 F (37 C) 97.7 F (36.5 C)  SpO2:  97%    Exam:  Constitutional:  The patient is awake, alert, and oriented x 3. No acute distress. Respiratory:  No increased work of breathing. No wheezes, rales, or rhonchi No tactile fremitus Cardiovascular:  Regular rate and rhythm No murmurs, ectopy, or gallups. No lateral PMI. No thrills. Abdomen:  Abdomen is soft,  non-tender, non-distended No hernias, masses, or organomegaly Normoactive bowel sounds.  Musculoskeletal:  No cyanosis, clubbing, or edema Skin:  No rashes, lesions, ulcers palpation of skin: no induration or nodules Neurologic:  CN 2-12 intact Sensation all 4 extremities intact Psychiatric:  Mental status Mood, affect appropriate Orientation to person, place, time  judgment and insight appear intact  I have personally reviewed the following:   Today's Data  Vitals  Lab Data  BMP CBC  Micro Data  MRSA negative  Imaging  CTA chest CT head without contrast CT lumbar and thoracic spine  Cardiology Data  EKG Echocardiogram  Other Data    Scheduled Meds:  amLODipine  10 mg Oral Daily   aspirin EC  81 mg Oral Daily   atorvastatin  80 mg Oral Daily   heparin  5,000 Units Subcutaneous Q8H   levothyroxine  50 mcg Oral Q0600   losartan  50 mg Oral Daily   mouth rinse  15 mL Mouth Rinse BID   metoprolol tartrate  12.5 mg Oral BID   polyethylene glycol  17 g Oral Daily   sodium chloride  1 g Oral BID WC    Principal Problem:   Thoracic compression fracture (HCC) Active Problems:   Elevated troponin   Demand ischemia (HCC)   Primary hypertension   LOS: 10 days   A & P  Fall/compression fracture: CT thoracic spine showed T1, T6 compression fracture with 10 to 20% height loss with no retropulsion or spinal  canal stenosis.  Continue pain management.  IR has been consulted for the consideration of kyphoplasty. The patient went for kyphoplasty on 07/11/2020. Unfortunately IR was not able to visualize T1 well enough for kyphoplasty to be performed despite multiple attempts to reposition her. However, the procedure was successful as performed on T6. The patient has been evaluated by PT/OT. She is felt to be a candidate for CIR. Rehab has been consulted to further evaluate her.  NSTEMI/history of coronary artery disease: Troponin was elevated troponin in the range of 2K but  flat trended  She has history of coronary artery disease status post PCI to RCA and diagonal in 1993 .  Has residual 70% LAD lesion. Echo done here showed left ventricular ejection fraction of 55-60%, normal regional wall motion.  Elevated troponin felt to be from demand ischemia.  Patient currently denies any chest pain.  Heparin drip discontinued.  No plans for cardiac cath.  Cardiology was following. No complaint of chest pain but she has sternal tenderness likely related to fall.   Hypertension: Pt is currently normotensive on losartan and metoprolol which have been continued as he takes them at home and amlodipine which was added  as inpatient to better control his blood pressure.  Monitor.     Hyperlipidemia: On Lipitor   Right breast abnormal imaging: As seen on CTA: 2X 1.9 cm high-attenuation lesion in the right. She has breast lumps for years and was following with GYN, had mammograms, excisional biopsy of one of her breast was found to be benign.  She does not want further investigation.  If she wishes, outpatient mammogram/breast ultrasound recommended as outpatient to exclude neoplasm of breast   Elevated ALP: GGT elevated, ALP elevated.  Abdominal ultrasound did not show any abnormality.  Could be associated with pathology with bones with possible underlying breast cancer.  Can be monitored as an outpatient.   Macrocytic anemia: Currently hemoglobin stable in the range of 9.  Iron studies showed normal iron.  Normal vitamin B12 and folic acid   Hyponatremia: Mild, stable, continue to monitor. Pt is a little volume overloaded. Lasix one time given with NaCl tablets. Sodium is stable at 129. Monitor.   I have seen and examined this patient myself. I have spent 32 minutes in her evaluation and care.   Natasha Stanfield, DO [facility] Direct contact: see [email or web address]  7PM-7AM contact night coverage as above 07/14/2021, 5:44 PM  LOS: 6 days

## 2021-07-15 NOTE — Progress Notes (Signed)
Occupational Therapy Treatment Patient Details Name: Natasha Harper MRN: [id number] DOB: [birth year] Today's Date: 07/15/2021   History of present illness 87 yo female presents to ED on 12/29 with x2 falls, back pain sustaining T1, T6 thoracic compression fractures. Pt also with elevated troponins, NSTEMI, cards following. CTH negative for acute findings. s/p FLUOROSCOPY GUIDED T6 CORE BONE AND BALLOON KYPHOPLASTY  on 1/5. PMH includes CAD with angioplasty 1993, HTN, osteoporosis.   OT comments  Pt complete PT session then toileting transfer then bed transfer with OT arriving. Pt continued to show activity tolerance with multiple activities. Pt with sternal pain with reaching and bed mobility. Recommendation for CIR.    Recommendations for follow up therapy are one component of a multi-disciplinary discharge planning process, led by the attending physician.  Recommendations may be updated based on patient status, additional functional criteria and insurance authorization.    Follow Up Recommendations  Acute inpatient rehab (3hours/day)    Assistance Recommended at Discharge Intermittent Supervision/Assistance  Patient can return home with the following  A little help with walking and/or transfers;A little help with bathing/dressing/bathroom;Assistance with cooking/housework;Direct supervision/assist for medications management;Assist for transportation   Equipment Recommendations  BSC/3in1    Recommendations for Other Services Rehab consult    Precautions / Restrictions Precautions Precautions: Fall Precaution Comments: back precautions and sternal bracing due to pain Restrictions Weight Bearing Restrictions: No       Mobility Bed Mobility Overal bed mobility: Needs Assistance Bed Mobility: Rolling;Supine to Sit;Sit to Supine Rolling: Min assist Sidelying to sit: Min assist Supine to sit: Min guard Sit to supine: Mod assist   General bed mobility comments: pt able to follow  cues to complete pushing up with R elbow with sternal pain. pt requires (A) To bring bil Le back onto bed surface and bracing a pillow due to pain at sternum    Transfers Overall transfer level: Needs assistance Equipment used: Rollator (4 wheels) Transfers: Sit to/from Stand Sit to Stand: Min guard           General transfer comment: assist for power up, steadying upon standing, cues for hand placement when rising/sitting.     Balance Overall balance assessment: Needs assistance;History of Falls Sitting-balance support: No upper extremity supported;Feet supported Sitting balance-[name]: Fair     Standing balance support: Bilateral upper extremity supported;Reliant on assistive device for balance Standing balance-[name]: Poor                             ADL either performed or assessed with clinical judgement   ADL Overall ADL's : Needs assistance/impaired             Lower Body Bathing: Moderate assistance   Upper Body Dressing : Minimal assistance   Lower Body Dressing: Moderate assistance Lower Body Dressing Details (indicate cue type and reason): pt is able to initiate figure 4 cross but unabel to sustain on R side. Pt c/o sternal discomfort with reach. Pt able to get L LE up easier into figure 4 but continues to have difficulty due to sternal pain             Functional mobility during ADLs: Rolling walker (2 wheels);Min guard General ADL Comments: sternal discomfort limites reaching task outside base of support    Extremity/Trunk Assessment Upper Extremity Assessment Upper Extremity Assessment: Generalized weakness   Lower Extremity Assessment Lower Extremity Assessment: Generalized weakness        Vision  Perception     Praxis      Cognition Arousal/Alertness: Awake/alert Behavior During Therapy: WFL for tasks assessed/performed Overall Cognitive Status: No family/caregiver present to determine baseline cognitive  functioning                                 General Comments: hoh          Exercises Other Exercises Other Exercises: STS x3 from recliner as intervention   Shoulder Instructions       General Comments      Pertinent Vitals/ Pain       Pain Assessment: Faces Faces Pain Scale: Hurts even more Pain Location: chest Pain Descriptors / Indicators: Discomfort;Grimacing Pain Intervention(s): Monitored during session;Repositioned (educated on bracing with pillow)  Home Living                                          Prior Functioning/Environment              Frequency  Min 2X/week        Progress Toward Goals  OT Goals(current goals can now be found in the care plan section)  Progress towards OT goals: Progressing toward goals  Acute Rehab OT Goals Patient Stated Goal: to get to rehab OT Goal Formulation: With patient/family Time For Goal Achievement: 07/26/21 Potential to Achieve Goals: Good ADL Goals Pt Will Perform Upper Body Bathing: with set-up;sitting Pt Will Perform Lower Body Bathing: with min guard assist;sit to/from stand Pt Will Transfer to Toilet: with min guard assist;bedside commode;stand pivot transfer Additional ADL Goal #1: pt will complete bed mobility supervision as precursor to adls.  Plan Discharge plan remains appropriate    Co-evaluation                 AM-PAC OT "6 Clicks" Daily Activity     Outcome Measure   Help from another person eating meals?: A Little Help from another person taking care of personal grooming?: A Little Help from another person toileting, which includes using toliet, bedpan, or urinal?: A Little Help from another person bathing (including washing, rinsing, drying)?: A Little Help from another person to put on and taking off regular upper body clothing?: A Little Help from another person to put on and taking off regular lower body clothing?: A Lot 6 Click Score: 17     End of Session Equipment Utilized During Treatment: Rolling walker (2 wheels)  OT Visit Diagnosis: Unsteadiness on feet (R26.81);Muscle weakness (generalized) (M62.81)   Activity Tolerance Patient tolerated treatment well   Patient Left in bed;with call bell/phone within reach;with bed alarm set   Nurse Communication Mobility status;Precautions        Time: 3382-5053 OT Time Calculation (min): 14 min  Charges: OT General Charges $OT Visit: 1 Visit OT Treatments $Self Care/Home Management : 8-22 mins   Brynn, OTR/L  Acute Rehabilitation Services Pager: [phone] Office: [phone] .   Mateo Flow 07/15/2021, 4:06 PM

## 2021-07-15 NOTE — Progress Notes (Signed)
Inpatient Rehab Admissions Coordinator:   I do not have insurance auth or a CIR bed today. I will open a case with insurance when therapy notes are in.   Megan Salon, MS, CCC-SLP Rehab Admissions Coordinator  [phone] (celll) [phone] (office)

## 2021-07-16 DIAGNOSIS — I248 Other forms of acute ischemic heart disease: Secondary | ICD-10-CM | POA: Diagnosis not present

## 2021-07-16 DIAGNOSIS — I10 Essential (primary) hypertension: Secondary | ICD-10-CM | POA: Diagnosis not present

## 2021-07-16 DIAGNOSIS — R778 Other specified abnormalities of plasma proteins: Secondary | ICD-10-CM | POA: Diagnosis not present

## 2021-07-16 DIAGNOSIS — S22050A Wedge compression fracture of T5-T6 vertebra, initial encounter for closed fracture: Secondary | ICD-10-CM | POA: Diagnosis not present

## 2021-07-16 LAB — CBC WITH DIFFERENTIAL/PLATELET
Abs Immature Granulocytes: 0.01 10*3/uL (ref 0.00–0.07)
Basophils Absolute: 0 10*3/uL (ref 0.0–0.1)
Basophils Relative: 1 %
Eosinophils Absolute: 0.1 10*3/uL (ref 0.0–0.5)
Eosinophils Relative: 2 %
HCT: 31.1 % — ABNORMAL LOW (ref 36.0–46.0)
Hemoglobin: 10.3 g/dL — ABNORMAL LOW (ref 12.0–15.0)
Immature Granulocytes: 0 %
Lymphocytes Relative: 25 %
Lymphs Abs: 1 10*3/uL (ref 0.7–4.0)
MCH: 34 pg (ref 26.0–34.0)
MCHC: 33.1 g/dL (ref 30.0–36.0)
MCV: 102.6 fL — ABNORMAL HIGH (ref 80.0–100.0)
Monocytes Absolute: 0.3 10*3/uL (ref 0.1–1.0)
Monocytes Relative: 8 %
Neutro Abs: 2.4 10*3/uL (ref 1.7–7.7)
Neutrophils Relative %: 64 %
Platelets: 427 10*3/uL — ABNORMAL HIGH (ref 150–400)
RBC: 3.03 MIL/uL — ABNORMAL LOW (ref 3.87–5.11)
RDW: 13.2 % (ref 11.5–15.5)
WBC: 3.8 10*3/uL — ABNORMAL LOW (ref 4.0–10.5)
nRBC: 0 % (ref 0.0–0.2)

## 2021-07-16 LAB — BASIC METABOLIC PANEL
Anion gap: 7 (ref 5–15)
BUN: 8 mg/dL (ref 8–23)
CO2: 26 mmol/L (ref 22–32)
Calcium: 8.7 mg/dL — ABNORMAL LOW (ref 8.9–10.3)
Chloride: 100 mmol/L (ref 98–111)
Creatinine, Ser: 0.41 mg/dL — ABNORMAL LOW (ref 0.44–1.00)
GFR, Estimated: >60 mL/min (ref >60)
Glucose, Bld: 121 mg/dL — ABNORMAL HIGH (ref 70–99)
Potassium: 3.5 mmol/L (ref 3.5–5.1)
Sodium: 133 mmol/L — ABNORMAL LOW (ref 135–145)

## 2021-07-16 NOTE — Progress Notes (Signed)
Inpatient Rehab Admissions Coordinator:   I do not have insurance auth or a CIR bed for this pt. Today. I will continue to follow for potential admit pending insurance auth and bed availability.  Lovinia Snare, MS, CCC-SLP Rehab Admissions Coordinator  [phone] (celll) [phone] (office)  

## 2021-07-16 NOTE — Progress Notes (Signed)
Physical Therapy Treatment Patient Details Name: Natasha Harper MRN: [id number] DOB: [birth year] Today's Date: 07/16/2021   History of Present Illness 87 yo female presents to ED on 12/29 with x2 falls, back pain sustaining T1, T6 thoracic compression fractures. Pt also with elevated troponins, NSTEMI, cards following. CTH negative for acute findings. s/p FLUOROSCOPY GUIDED T6 CORE BONE AND BALLOON KYPHOPLASTY  on 1/5. PMH includes CAD with angioplasty 1993, HTN, osteoporosis.    PT Comments    Pt reporting soreness in back upon PT arrival due to time up in chair, improved once pt mobilizing. Pt with improving activity tolerance, ambulating hallway distance with use of RW, PT physical assist, and mod cuing for form/safety. Pt and family await decision regarding AIR post-acutely.      Recommendations for follow up therapy are one component of a multi-disciplinary discharge planning process, led by the attending physician.  Recommendations may be updated based on patient status, additional functional criteria and insurance authorization.  Follow Up Recommendations  Acute inpatient rehab (3hours/day)     Assistance Recommended at Discharge Frequent or constant Supervision/Assistance  Patient can return home with the following A little help with bathing/dressing/bathroom;A little help with walking and/or transfers;Assistance with cooking/housework;Assist for transportation   Equipment Recommendations  None recommended by PT    Recommendations for Other Services       Precautions / Restrictions Precautions Precautions: Fall Precaution Comments: practiced log roll, pt limited by pain Restrictions Weight Bearing Restrictions: No     Mobility  Bed Mobility Overal bed mobility: Needs Assistance Bed Mobility: Sit to Supine     Supine to sit: Min assist Sit to supine: Mod assist   General bed mobility comments: assist for trunk lowering, LE lifting into bed, repositioning with  assist of bed pads.    Transfers Overall transfer level: Needs assistance Equipment used: Rollator (4 wheels) Transfers: Sit to/from Stand Sit to Stand: Min assist           General transfer comment: assist for rise, steadying. Cues for hand placement when rising/sitting.    Ambulation/Gait Ambulation/Gait assistance: Min assist Gait Distance (Feet): 100 Feet Assistive device: Rolling walker (2 wheels) Gait Pattern/deviations: Step-through pattern;Decreased stride length;Trunk flexed Gait velocity: decr     General Gait Details: assist to steady and maneuver RW, cues for upright posture multiple times, placement in RW.   Stairs             Wheelchair Mobility    Modified Rankin (Stroke Patients Only)       Balance Overall balance assessment: Needs assistance;History of Falls Sitting-balance support: No upper extremity supported;Feet supported Sitting balance-[name]: Fair     Standing balance support: Bilateral upper extremity supported;During functional activity;Reliant on assistive device for balance Standing balance-[name]: Poor                              Cognition Arousal/Alertness: Awake/alert Behavior During Therapy: WFL for tasks assessed/performed Overall Cognitive Status: No family/caregiver present to determine baseline cognitive functioning                                 General Comments: slightly irritable, given back pain from being up in chair. Cannot find her way back to after short hallway distance        Exercises General Exercises - Lower Extremity Heel Slides: AROM;Both;10 reps;Supine Hip ABduction/ADduction: AROM;Both;10 reps;Supine  Straight Leg Raises: AROM;Both;10 reps;Supine    General Comments        Pertinent Vitals/Pain Pain Assessment: Faces Faces Pain Scale: Hurts little more Pain Location: chest, back Pain Descriptors / Indicators: Discomfort;Grimacing Pain Intervention(s):  Limited activity within patient's tolerance;Monitored during session;Repositioned    Home Living                          Prior Function            PT Goals (current goals can now be found in the care plan section) Acute Rehab PT Goals Patient Stated Goal: independence PT Goal Formulation: With patient/family Time For Goal Achievement: 07/26/21 Potential to Achieve Goals: Good Progress towards PT goals: Progressing toward goals    Frequency    Min 4X/week      PT Plan Current plan remains appropriate    Co-evaluation              AM-PAC PT "6 Clicks" Mobility   Outcome Measure  Help needed turning from your back to your side while in a flat bed without using bedrails?: A Little Help needed moving from lying on your back to sitting on the side of a flat bed without using bedrails?: A Little Help needed moving to and from a bed to a chair (including a wheelchair)?: A Lot (cues) Help needed standing up from a chair using your arms (e.g., wheelchair or bedside chair)?: A Little Help needed to walk in hospital room?: A Lot (cues) Help needed climbing 3-5 steps with a railing? : A Lot 6 Click Score: 15    End of Session Equipment Utilized During Treatment: Gait belt Activity Tolerance: Patient tolerated treatment well;Patient limited by pain Patient left: with call bell/phone within reach;in bed;with bed alarm set Nurse Communication: Mobility status PT Visit Diagnosis: Other abnormalities of gait and mobility (R26.89);Difficulty in walking, not elsewhere classified (R26.2)     Time: [id number] PT Time Calculation (min) (ACUTE ONLY): 21 min  Charges:  $Gait Training: 8-22 mins                     Stacie Glaze, PT DPT Acute Rehabilitation Services Pager [phone]  Office [phone]    Bryan 07/16/2021, 12:28 PM

## 2021-07-16 NOTE — Progress Notes (Signed)
PROGRESS NOTE    Natasha Harper  [id number] DOB: [birth year] DOA: 07/04/2021 PCP: Burnis Medin, MD   Brief Narrative:  Patient is a 87 year old female with history of hypothyroidism, hyperlipidemia, coronary artery disease, hypertension, very hard of hearing who presented with back pain and falls from home.  On presentation CT of the thoracic spine showed T1 and T6 compression fracture, lab work showed elevated troponin.  Started on heparin drip on admission but then discontinued by cardiology because elevated troponin thought to be from demand ischemia.  Underwent kyphoplasty. Unfortunately IR was not able to visualize T1 well enough for kyphoplasty to be performed despite multiple attempts to reposition her. However, the procedure was successful as performed on T6. The patient has been evaluated by PT/OT. She is felt to be a candidate for CIR. Rehab has been consulted to further evaluate her.  We are waiting for bed availability and insurance authorization.    Assessment & Plan:   Principal Problem:   Thoracic compression fracture (HCC) Active Problems:   Elevated troponin   Demand ischemia (HCC)   Primary hypertension  Fall/thoracic vertebrae compression fracture: CT thoracic spine showed T1, T6 compression fracture with 10 to 20% height loss with no retropulsion or spinal canal stenosis.  Underwent kyphoplasty on 07/11/2020. Unfortunately IR was not able to visualize T1 well enough for kyphoplasty to be performed despite multiple attempts to reposition her. However, the procedure was successful as performed on T6. The patient has been evaluated by PT/OT. She is felt to be a candidate for CIR. Rehab has been consulted to further evaluate her.  At this point in time, we are waiting for bed availability and insurance authorization.   history of coronary artery disease/elevated troponin: Troponin was elevated troponin in the range of 2K but flat trended. She has history of coronary  artery disease status post PCI to RCA and diagonal in 1993 .  Has residual 70% LAD lesion. Echo done here showed left ventricular ejection fraction of 55-60%, normal regional wall motion.  Cardiology consulted.  Elevated troponin felt to be from demand ischemia.  Patient currently denies any chest pain.  Heparin drip discontinued.  No plans for cardiac cath.     Hypertension: Pt is currently normotensive on losartan and metoprolol which have been continued as he takes them at home and amlodipine which was added  as inpatient to better control her blood pressure.   Hyperlipidemia: On Lipitor   Right breast abnormal imaging: As seen on CTA: 2X 1.9 cm high-attenuation lesion in the right. She has breast lumps for years and was following with GYN, had mammograms, excisional biopsy of one of her breast was found to be benign.  She does not want further investigation.  If she wishes, outpatient mammogram/breast ultrasound recommended as outpatient to exclude neoplasm of breast   Elevated ALP: GGT elevated, ALP elevated.  Abdominal ultrasound did not show any abnormality.  Could be associated with pathology with bones with possible underlying breast cancer.  Can be monitored as an outpatient.   Macrocytic anemia: Currently hemoglobin stable in the range of 9.  Iron studies showed normal iron.  Normal vitamin 123456 and folic acid   Hyponatremia: Mild, stable.  DVT prophylaxis: heparin injection 5,000 Units Start: 07/11/21 2200   Code Status: Full Code  Family Communication: Husband present at bedside.  Plan of care discussed with patient in length and he/she verbalized understanding and agreed with it.  Status is: Inpatient  Remains inpatient appropriate because: Awaiting bed  placement at [facility]  Estimated body mass index is 19.49 kg/m as calculated from the following:   Height as of this encounter: 5\' 1"  (1.549 m).   Weight as of this encounter: 46.8 kg.    Nutritional Assessment: Body mass index  is 19.49 kg/m.Marland Kitchen Seen by dietician.  I agree with the assessment and plan as outlined below: Nutrition Status:  Skin Assessment: I have examined the patient's skin and I agree with the wound assessment as performed by the wound care RN as outlined below:    Consultants:  IR  Procedures:  None  Antimicrobials:  Anti-infectives (From admission, onward)    Start     Dose/Rate Route Frequency Ordered Stop   07/11/21 0800  ceFAZolin (ANCEF) IVPB 2g/100 mL premix        2 g 200 mL/hr over 30 Minutes Intravenous To Radiology 07/10/21 1055 07/11/21 0900         Subjective: Patient seen and examined.  She is very hard of hearing.  She has no complaints.  Her husband is at the bedside.  Objective: Vitals:   07/15/21 2341 07/16/21 0437 07/16/21 0500 07/16/21 0700  BP: (!) 144/66 (!) 158/57  (!) 152/62  Pulse: 66 77    Resp: 13 18    Temp: 97.7 F (36.5 C) 98.5 F (36.9 C)  (!) 97.5 F (36.4 C)  TempSrc: Oral Oral  Oral  SpO2: 94% 96%    Weight:   46.8 kg   Height:        Intake/Output Summary (Last 24 hours) at 07/16/2021 1132 Last data filed at 07/16/2021 0438 Gross per 24 hour  Intake --  Output 750 ml  Net -750 ml   Filed Weights   07/14/21 0324 07/15/21 0407 07/16/21 0500  Weight: 46.6 kg 46.9 kg 46.8 kg    Examination:  General exam: Appears calm and comfortable  Respiratory system: Clear to auscultation. Respiratory effort normal. Cardiovascular system: S1 & S2 heard, RRR. No JVD, murmurs, rubs, gallops or clicks. No pedal edema. Gastrointestinal system: Abdomen is nondistended, soft and nontender. No organomegaly or masses felt. Normal bowel sounds heard. Central nervous system: Alert and oriented. No focal neurological deficits. Extremities: Symmetric 5 x 5 power. Skin: No rashes, lesions or ulcers    Data Reviewed: I have personally reviewed following labs and imaging studies  CBC: Recent Labs  Lab 07/11/21 0405 07/16/21 0842  WBC 6.1 3.8*   NEUTROABS 4.7 2.4  HGB 10.1* 10.3*  HCT 29.4* 31.1*  MCV 101.0* 102.6*  PLT 353 [id number]*   Basic Metabolic Panel: Recent Labs  Lab 07/11/21 0405 07/12/21 0327 07/13/21 0416 07/15/21 0359 07/16/21 0842  NA 129* 129* 129* 130* 133*  K 3.6 3.5 3.5 3.6 3.5  CL 97* 98 100 97* 100  CO2 26 24 24 26 26   GLUCOSE 89 91 98 90 121*  BUN 10 13 14 12 8   CREATININE 0.45 0.34* 0.37* 0.36* 0.41*  CALCIUM 8.6* 8.4* 8.8* 8.6* 8.7*   GFR: Estimated Creatinine Clearance: 38.7 mL/min (A) (by C-G formula based on SCr of 0.41 mg/dL (L)). Liver Function Tests: No results for input(s): AST, ALT, ALKPHOS, BILITOT, PROT, ALBUMIN in the last 168 hours. No results for input(s): LIPASE, AMYLASE in the last 168 hours. No results for input(s): AMMONIA in the last 168 hours. Coagulation Profile: No results for input(s): INR, PROTIME in the last 168 hours. Cardiac Enzymes: No results for input(s): CKTOTAL, CKMB, CKMBINDEX, TROPONINI in the last 168 hours. BNP (last 3  results) No results for input(s): PROBNP in the last 8760 hours. HbA1C: No results for input(s): HGBA1C in the last 72 hours. CBG: No results for input(s): GLUCAP in the last 168 hours. Lipid Profile: No results for input(s): CHOL, HDL, LDLCALC, TRIG, CHOLHDL, LDLDIRECT in the last 72 hours. Thyroid Function Tests: No results for input(s): TSH, T4TOTAL, FREET4, T3FREE, THYROIDAB in the last 72 hours. Anemia Panel: No results for input(s): VITAMINB12, FOLATE, FERRITIN, TIBC, IRON, RETICCTPCT in the last 72 hours. Sepsis Labs: No results for input(s): PROCALCITON, LATICACIDVEN in the last 168 hours.  No results found for this or any previous visit (from the past 240 hour(s)).   Radiology Studies: No results found.  Scheduled Meds:  amLODipine  10 mg Oral Daily   aspirin EC  81 mg Oral Daily   atorvastatin  80 mg Oral Daily   heparin  5,000 Units Subcutaneous Q8H   levothyroxine  50 mcg Oral Q0600   losartan  50 mg Oral Daily   mouth  rinse  15 mL Mouth Rinse BID   metoprolol tartrate  12.5 mg Oral BID   polyethylene glycol  17 g Oral Daily   sodium chloride  1 g Oral BID WC   Continuous Infusions:   LOS: 11 days   Time spent: 32 minutes  Darliss Cheney, MD [facility]  07/16/2021, 11:32 AM  Please page via Shea Evans and do not message via secure chat for anything urgent. Secure chat can be used for anything non urgent.  How to contact the [facility] Attending or Consulting provider [place] or covering provider during after hours [place], for this patient?  Check the care team in [facility] and look for a) attending/consulting TRH provider listed and b) the [facility] team listed. Page or secure chat [phone]. Log into [email or web address] and use [place] universal password to access. If you do not have the password, please contact the hospital operator. Locate the [facility] Behavioral provider you are looking for under [facility] and page to a number that you can be directly reached. If you still have difficulty reaching the provider, please page the [facility] (Director on Call) for the Hospitalists listed on amion for assistance.

## 2021-07-17 NOTE — Progress Notes (Signed)
Occupational Therapy Treatment Patient Details Name: Natasha Harper MRN: [id number] DOB: [birth year] Today's Date: 07/17/2021   History of present illness 87 yo female presents to ED on 12/29 with x2 falls, back pain sustaining T1, T6 thoracic compression fractures. Pt also with elevated troponins, NSTEMI, cards following. CTH negative for acute findings. s/p FLUOROSCOPY GUIDED T6 CORE BONE AND BALLOON KYPHOPLASTY  on 1/5. PMH includes CAD with angioplasty 1993, HTN, osteoporosis.   OT comments  Pt making progress towards OT goals this session, good recall of back precautions, and maintenance this session. Pt able to complete bed mobility at min A via log roll technique and with use of bed rail. Pt mod A for LB dressing. Attempted figure 4 with more trouble on the right, Pt min A for transfers with ambulation into the bathroom , sink level grooming leaning on sink for balance and with fatigue in standing. Pt then motivated and wanted to perform hallway ambulation at min guard assist. Pt demonstrates ability to perform 3 hours of intensive therapy as well as need for education/adaptive strategies for ADL and functional transfers and AIR continues to be best post-acute option. Pt has very supportive husband who can provide 24/7 assist. OT will continue to follow acutely.    Recommendations for follow up therapy are one component of a multi-disciplinary discharge planning process, led by the attending physician.  Recommendations may be updated based on patient status, additional functional criteria and insurance authorization.    Follow Up Recommendations  Acute inpatient rehab (3hours/day)    Assistance Recommended at Discharge Intermittent Supervision/Assistance  Patient can return home with the following  A little help with walking and/or transfers;A little help with bathing/dressing/bathroom;Assistance with cooking/housework;Direct supervision/assist for medications management;Assist for  transportation   Equipment Recommendations  BSC/3in1    Recommendations for Other Services Rehab consult    Precautions / Restrictions Precautions Precautions: Fall Precaution Comments: back precautions and sternal bracing due to pain Restrictions Weight Bearing Restrictions: No       Mobility Bed Mobility Overal bed mobility: Needs Assistance Bed Mobility: Rolling;Sidelying to Sit Rolling: Min assist   Supine to sit: Min assist     General bed mobility comments: pt able to follow cues to complete pushing up with R elbow with sternal pain. pt requires (A) To walk BLE off bed surface, use of bed rail to assist with trunk elevation    Transfers Overall transfer level: Needs assistance Equipment used: Rolling walker (2 wheels) Transfers: Sit to/from Stand Sit to Stand: Min assist           General transfer comment: assist for power up rise and steadying upon standing, good follow throughout with hand placement from previous sessiohn     Balance Overall balance assessment: Needs assistance;History of Falls Sitting-balance support: No upper extremity supported;Feet supported Sitting balance-[name]: Fair     Standing balance support: Bilateral upper extremity supported;Reliant on assistive device for balance Standing balance-[name]: Poor Standing balance comment: dependent on BUE, or external support during functional standing ADL                           ADL either performed or assessed with clinical judgement   ADL Overall ADL's : Needs assistance/impaired     Grooming: Minimal assistance;Standing;Wash/dry hands Grooming Details (indicate cue type and reason): decreased activity tolerance for standing, leans against sink intermittently for balance         Upper Body Dressing : Minimal  assistance Upper Body Dressing Details (indicate cue type and reason): to don hospital gown like robe Lower Body Dressing: Moderate  assistance;Sitting/lateral leans Lower Body Dressing Details (indicate cue type and reason): pt is able to initiate figure 4 cross but unable to sustain on R side. Pt able to get L LE up easier into figure 4 but continues to have difficulty due to sternal pain Toilet Transfer: Minimal assistance;Ambulation;Rolling walker (2 wheels);Cueing for safety Toilet Transfer Details (indicate cue type and reason): min A for balance and for steering the RW Toileting- Clothing Manipulation and Hygiene: Maximal assistance;Sit to/from stand       Functional mobility during ADLs: Rolling walker (2 wheels);Min guard      Extremity/Trunk Assessment Upper Extremity Assessment Upper Extremity Assessment: Generalized weakness   Lower Extremity Assessment Lower Extremity Assessment: Defer to PT evaluation        Vision       Perception     Praxis      Cognition Arousal/Alertness: Awake/alert Behavior During Therapy: WFL for tasks assessed/performed Overall Cognitive Status: Within Functional Limits for tasks assessed                                 General Comments: hoh          Exercises     Shoulder Instructions       General Comments VSS on RA    Pertinent Vitals/ Pain       Pain Assessment: Faces Faces Pain Scale: Hurts a little bit Pain Location: chestm, back Pain Descriptors / Indicators: Discomfort;Grimacing Pain Intervention(s): Limited activity within patient's tolerance;Monitored during session;Repositioned  Home Living                                          Prior Functioning/Environment              Frequency  Min 2X/week        Progress Toward Goals  OT Goals(current goals can now be found in the care plan section)  Progress towards OT goals: Progressing toward goals  Acute Rehab OT Goals Patient Stated Goal: to go to rehab OT Goal Formulation: With patient/family Time For Goal Achievement: 07/26/21 Potential to  Achieve Goals: Good  Plan Discharge plan remains appropriate    Co-evaluation                 AM-PAC OT "6 Clicks" Daily Activity     Outcome Measure   Help from another person eating meals?: A Little Help from another person taking care of personal grooming?: A Little Help from another person toileting, which includes using toliet, bedpan, or urinal?: A Little Help from another person bathing (including washing, rinsing, drying)?: A Little Help from another person to put on and taking off regular upper body clothing?: A Little Help from another person to put on and taking off regular lower body clothing?: A Lot 6 Click Score: 17    End of Session Equipment Utilized During Treatment: Rolling walker (2 wheels)  OT Visit Diagnosis: Unsteadiness on feet (R26.81);Muscle weakness (generalized) (M62.81)   Activity Tolerance Patient tolerated treatment well   Patient Left with call bell/phone within reach;in chair;with chair alarm set   Nurse Communication Mobility status;Precautions        Time: 2094-7096 OT Time Calculation (min): 31 min  Charges: OT General Charges $OT Visit: 1 Visit OT Treatments $Self Care/Home Management : 8-22 mins $Therapeutic Activity: 8-22 mins  Nyoka Cowden OTR/L Acute Rehabilitation Services Pager: [phone] Office: [phone]  Evern Bio Earnestine Shipp 07/17/2021, 11:45 AM

## 2021-07-17 NOTE — TOC Initial Note (Signed)
Transition of Care [facility]) - Initial/Assessment Note    Patient Details  Name: Natasha Harper MRN: [id number] Date of Birth: 16-Nov-1934  [facility]) CM/SW Contact:    Beckie Busing, RN Phone Number:[phone]  07/17/2021, 3:08 PM  Clinical Narrative:                 TOC following patient with high risk for readmission. CM at bedside to assess patient that is very hard of hearing. Patient does state that she is from home where she lives with her husband. Patient confirms that her PCP is Dr. Berniece Andreas and her pharmacy is [facility] on [address]. Patient states that her husband assist her to appointments and with picking up medications. Patient states that she is agreeable to inpatient rehab and hopes that she will become stronger and able to go home. Assessment is limited by patients ability to hear or understand some questions. Patient is unable to verbalize if she has DME at home. Patient repeatedly states that she doesn't understand. Patient has insurance auth pending for CIR. TOC will continue to follow.   Expected Discharge Plan: IP Rehab Facility Barriers to Discharge: Continued Medical Work up   Patient Goals and CMS Choice Patient states their goals for this hospitalization and ongoing recovery are:: Patient wants to go to rehab CMS Medicare.gov Compare Post Acute Care list provided to::  (n/a) Choice offered to / list presented to : NA  Expected Discharge Plan and Services Expected Discharge Plan: IP Rehab Facility In-house Referral: NA Discharge Planning Services: CM Consult Post Acute Care Choice: NA Living arrangements for the past 2 months: Single Family Home                 DME Arranged: N/A (Patient to discharge to [facility] inpatient rehab) DME Agency: NA       HH Arranged: NA (n/a) HH Agency: NA        Prior Living Arrangements/Services Living arrangements for the past 2 months: Single Family Home Lives with:: Spouse Patient language and need  for interpreter reviewed:: Yes Do you feel safe going back to the place where you live?: Yes      Need for Family Participation in Patient Care: Yes (Comment) Care giver support system in place?: Yes (comment) Current home services:  (none) Criminal Activity/Legal Involvement Pertinent to Current Situation/Hospitalization: No - Comment as needed  Activities of Daily Living Home Assistive Devices/Equipment: Contact lenses, Other (Comment), Walker (specify type) (walk-in shower) ADL Screening (condition at time of admission) Patient's cognitive ability adequate to safely complete daily activities?: Yes Is the patient deaf or have difficulty hearing?: Yes (HOH) Does the patient have difficulty seeing, even when wearing glasses/contacts?: No Does the patient have difficulty concentrating, remembering, or making decisions?: No Patient able to express need for assistance with ADLs?: Yes Does the patient have difficulty dressing or bathing?: Yes (secondary to weaknes and pain) Independently performs ADLs?: No (secondary to weaknes and pain-not able to walk) Communication: Independent Dressing (OT): Dependent Is this a change from baseline?: Change from baseline, expected to last >3 days Grooming: Independent Feeding: Independent Bathing: Dependent Is this a change from baseline?: Change from baseline, expected to last >3 days Toileting: Dependent Is this a change from baseline?: Change from baseline, expected to last >3days In/Out Bed: Dependent Is this a change from baseline?: Change from baseline, expected to last >3 days Walks in Home: Dependent Is this a change from baseline?: Change from baseline, expected to last >3 days  Does the patient have difficulty walking or climbing stairs?: Yes (secondary to weaknes and pain-not able to walk) Weakness of Legs: Both Weakness of Arms/Hands: None  Permission Sought/Granted   Permission granted to share information with : No               Emotional Assessment Appearance:: Appears stated age Attitude/Demeanor/Rapport: Gracious Affect (typically observed): Pleasant Orientation: : Oriented to Self, Oriented to Place, Oriented to  Time, Oriented to Situation Alcohol / Substance Use: Not Applicable Psych Involvement: No (comment)  Admission diagnosis:  Precordial chest pain [R07.2] Elevated troponin [R77.8] Thoracic compression fracture (HCC) [S22.000A] Fall, initial encounter [W19.XXXA] Elevated alkaline phosphatase level [R74.8] Acute midline thoracic back pain [M54.6] Patient Active Problem List   Diagnosis Date Noted   Primary hypertension    Elevated troponin    Demand ischemia (HCC)    Thoracic compression fracture (HCC) 07/05/2021   Vertebral fracture, osteoporotic (HCC) 04/15/2021   Hyponatremia 04/14/2021   Hypokalemia 04/14/2021   Anemia 04/14/2021   Compression fracture of thoracic vertebra, closed, initial encounter (HCC) 04/13/2021   Sacral fracture, closed (HCC) 04/13/2021   Compression fracture of thoracic vertebra (HCC) 02/06/2021   Compression of lumbar vertebra (HCC) 02/06/2021   Back pain 02/05/2021   Medicare annual wellness visit, subsequent 12/20/2014   Decreased hearing 10/05/2013   Dyslipidemia    Carotid arterial disease (HCC)    Hypothyroidism    Ejection fraction    Visit for preventive health examination 06/02/2011   Medication side effect 06/02/2011   Disorder of bone and cartilage 04/07/2008   HYPERTENSION, WHITE COAT 04/07/2008   HYPERGLYCEMIA, FASTING 07/21/2007   THYROID NODULE 04/05/2007   PCP:  [name], MD Pharmacy:   [facility] [address], [place] - [address] [place] [address] Phone: [phone] Fax: [phone]     Social Determinants of Health (SDOH) Interventions    Readmission Risk Interventions Readmission Risk Prevention Plan 07/17/2021  Transportation Screening Complete  PCP or Specialist Appt  within 3-5 Days Not Complete  Not Complete comments Plan to d/c to [facility] Inpatient rehab  HRI or Home Care Consult Not Complete  HRI or Home Care Consult comments Patient to d/c to [facility] Inpatient rehab  Social Work Consult for Recovery Care Planning/Counseling Complete  Palliative Care Screening Not Applicable  Medication Review (RN Care Manager) Referral to Pharmacy  Some recent data might be hidden

## 2021-07-17 NOTE — Progress Notes (Signed)
PROGRESS NOTE    Natasha Harper  [id number] DOB: 07/15/1934 DOA: 07/04/2021 PCP: Burnis Medin, MD   Brief Narrative:  Patient is a 87 year old female with history of hypothyroidism, hyperlipidemia, coronary artery disease, hypertension, very hard of hearing who presented with back pain and falls from home.  On presentation CT of the thoracic spine showed T1 and T6 compression fracture, lab work showed elevated troponin.  Started on heparin drip on admission but then discontinued by cardiology because elevated troponin thought to be from demand ischemia.  Underwent kyphoplasty. Unfortunately IR was not able to visualize T1 well enough for kyphoplasty to be performed despite multiple attempts to reposition her. However, the procedure was successful as performed on T6. The patient has been evaluated by PT/OT. She is felt to be a candidate for CIR. Rehab has been consulted to further evaluate her.  We are waiting for bed availability and insurance authorization.    Assessment & Plan:   Principal Problem:   Thoracic compression fracture (HCC) Active Problems:   Elevated troponin   Demand ischemia (HCC)   Primary hypertension  Fall/thoracic vertebrae compression fracture: CT thoracic spine showed T1, T6 compression fracture with 10 to 20% height loss with no retropulsion or spinal canal stenosis.  Underwent kyphoplasty on 07/11/2020. Unfortunately IR was not able to visualize T1 well enough for kyphoplasty to be performed despite multiple attempts to reposition her. However, the procedure was successful as performed on T6. The patient has been evaluated by PT/OT. She is felt to be a candidate for CIR. Rehab has been consulted to further evaluate her.  At this point in time, we are waiting for bed availability and insurance authorization.   history of coronary artery disease/elevated troponin: Troponin was elevated troponin in the range of 2K but flat trended. She has history of coronary  artery disease status post PCI to RCA and diagonal in 1993 .  Has residual 70% LAD lesion. Echo done here showed left ventricular ejection fraction of 55-60%, normal regional wall motion.  Cardiology consulted.  Elevated troponin felt to be from demand ischemia.  Patient currently denies any chest pain.  Heparin drip discontinued.  No plans for cardiac cath.     Hypertension: Pt is currently normotensive on losartan and metoprolol which have been continued as he takes them at home and amlodipine which was added  as inpatient to better control her blood pressure.   Hyperlipidemia: On Lipitor   Right breast abnormal imaging: As seen on CTA: 2X 1.9 cm high-attenuation lesion in the right. She has breast lumps for years and was following with GYN, had mammograms, excisional biopsy of one of her breast was found to be benign.  She does not want further investigation.  If she wishes, outpatient mammogram/breast ultrasound recommended as outpatient to exclude neoplasm of breast   Elevated ALP: GGT elevated, ALP elevated.  Abdominal ultrasound did not show any abnormality.  Could be associated with pathology with bones with possible underlying breast cancer.  Can be monitored as an outpatient.   Macrocytic anemia: Currently hemoglobin stable in the range of 9.  Iron studies showed normal iron.  Normal vitamin [id number] and folic acid   Hyponatremia: Mild, stable.  DVT prophylaxis: heparin injection 5,000 Units Start: 07/11/21 2200   Code Status: Full Code  Family Communication: Husband present at bedside.  Plan of care discussed with patient in length and he/she verbalized understanding and agreed with it.  Status is: Inpatient  Remains inpatient appropriate because: Awaiting bed  placement at [facility]  Estimated body mass index is 19.58 kg/m as calculated from the following:   Height as of this encounter: 5\' 1"  (1.549 m).   Weight as of this encounter: 47 kg.    Nutritional Assessment: Body mass index is  19.58 kg/m.Marland Kitchen Seen by dietician.  I agree with the assessment and plan as outlined below: Nutrition Status:  Skin Assessment: I have examined the patient's skin and I agree with the wound assessment as performed by the wound care RN as outlined below:    Consultants:  IR  Procedures:  None  Antimicrobials:  Anti-infectives (From admission, onward)    Start     Dose/Rate Route Frequency Ordered Stop   07/11/21 0800  ceFAZolin (ANCEF) IVPB 2g/100 mL premix        2 g 200 mL/hr over 30 Minutes Intravenous To Radiology 07/10/21 1055 07/11/21 0900         Subjective: Seen and examined.  She has no complaints.  Husband the bedside.  Objective: Vitals:   07/17/21 0400 07/17/21 0500 07/17/21 0817 07/17/21 1111  BP: (!) 145/70  140/62 137/65  Pulse: 70  74 72  Resp: 14  (!) 29 20  Temp: 98.8 F (37.1 C)   98 F (36.7 C)  TempSrc: Oral   Oral  SpO2: 95%  96% 98%  Weight:  47 kg    Height:        Intake/Output Summary (Last 24 hours) at 07/17/2021 1346 Last data filed at 07/17/2021 0915 Gross per 24 hour  Intake 650 ml  Output 1150 ml  Net -500 ml    Filed Weights   07/15/21 0407 07/16/21 0500 07/17/21 0500  Weight: 46.9 kg 46.8 kg 47 kg    Examination:  General exam: Appears calm and comfortable  Respiratory system: Clear to auscultation. Respiratory effort normal. Cardiovascular system: S1 & S2 heard, RRR. No JVD, murmurs, rubs, gallops or clicks. No pedal edema. Gastrointestinal system: Abdomen is nondistended, soft and nontender. No organomegaly or masses felt. Normal bowel sounds heard. Central nervous system: Alert and oriented. No focal neurological deficits. Extremities: Symmetric 5 x 5 power. Skin: No rashes, lesions or ulcers.     Data Reviewed: I have personally reviewed following labs and imaging studies  CBC: Recent Labs  Lab 07/11/21 0405 07/16/21 0842  WBC 6.1 3.8*  NEUTROABS 4.7 2.4  HGB 10.1* 10.3*  HCT 29.4* 31.1*  MCV 101.0* 102.6*   PLT 353 427*    Basic Metabolic Panel: Recent Labs  Lab 07/11/21 0405 07/12/21 0327 07/13/21 0416 07/15/21 0359 07/16/21 0842  NA 129* 129* 129* 130* 133*  K 3.6 3.5 3.5 3.6 3.5  CL 97* 98 100 97* 100  CO2 26 24 24 26 26   GLUCOSE 89 91 98 90 121*  BUN 10 13 14 12 8   CREATININE 0.45 0.34* 0.37* 0.36* 0.41*  CALCIUM 8.6* 8.4* 8.8* 8.6* 8.7*    GFR: Estimated Creatinine Clearance: 38.8 mL/min (A) (by C-G formula based on SCr of 0.41 mg/dL (L)). Liver Function Tests: No results for input(s): AST, ALT, ALKPHOS, BILITOT, PROT, ALBUMIN in the last 168 hours. No results for input(s): LIPASE, AMYLASE in the last 168 hours. No results for input(s): AMMONIA in the last 168 hours. Coagulation Profile: No results for input(s): INR, PROTIME in the last 168 hours. Cardiac Enzymes: No results for input(s): CKTOTAL, CKMB, CKMBINDEX, TROPONINI in the last 168 hours. BNP (last 3 results) No results for input(s): PROBNP in the last 8760 hours.  HbA1C: No results for input(s): HGBA1C in the last 72 hours. CBG: No results for input(s): GLUCAP in the last 168 hours. Lipid Profile: No results for input(s): CHOL, HDL, LDLCALC, TRIG, CHOLHDL, LDLDIRECT in the last 72 hours. Thyroid Function Tests: No results for input(s): TSH, T4TOTAL, FREET4, T3FREE, THYROIDAB in the last 72 hours. Anemia Panel: No results for input(s): VITAMINB12, FOLATE, FERRITIN, TIBC, IRON, RETICCTPCT in the last 72 hours. Sepsis Labs: No results for input(s): PROCALCITON, LATICACIDVEN in the last 168 hours.  No results found for this or any previous visit (from the past 240 hour(s)).   Radiology Studies: No results found.  Scheduled Meds:  amLODipine  10 mg Oral Daily   aspirin EC  81 mg Oral Daily   atorvastatin  80 mg Oral Daily   heparin  5,000 Units Subcutaneous Q8H   levothyroxine  50 mcg Oral Q0600   losartan  50 mg Oral Daily   mouth rinse  15 mL Mouth Rinse BID   metoprolol tartrate  12.5 mg Oral BID    polyethylene glycol  17 g Oral Daily   sodium chloride  1 g Oral BID WC   Continuous Infusions:   LOS: 12 days   Time spent: 24 minutes  Darliss Cheney, MD [facility]  07/17/2021, 1:46 PM  Please page via [facility] and do not message via secure chat for anything urgent. Secure chat can be used for anything non urgent.  How to contact the [facility] Attending or Consulting provider [place] or covering provider during after hours [place], for this patient?  Check the care team in [facility] and look for a) attending/consulting [facility] provider listed and b) the [facility] team listed. Page or secure chat [phone]. Log into [email or web address] and use [place]'s universal password to access. If you do not have the password, please contact the hospital operator. Locate the [facility] provider you are looking for under [facility] and page to a number that you can be directly reached. If you still have difficulty reaching the provider, please page the [facility] (Director on Call) for the Hospitalists listed on amion for assistance.

## 2021-07-17 NOTE — Progress Notes (Signed)
Physical Therapy Treatment Patient Details Name: Natasha Harper MRN: [id number] DOB: 05/13/1935 Today's Date: 07/17/2021   History of Present Illness 87 yo female presents to ED on 12/29 with x2 falls, back pain sustaining T1, T6 thoracic compression fractures. Pt also with elevated troponins, NSTEMI, cards following. CTH negative for acute findings. s/p FLUOROSCOPY GUIDED T6 CORE BONE AND BALLOON KYPHOPLASTY  on 1/5. PMH includes CAD with angioplasty 1993, HTN, osteoporosis.    PT Comments    Pt with improving activity tolerance, ambulatory for x2 hallway distances with seated rest break to recover fatigue. Pt continuing to require cues for form/safety regarding posture and back precautions during mobility. PT to continue to follow.    Recommendations for follow up therapy are one component of a multi-disciplinary discharge planning process, led by the attending physician.  Recommendations may be updated based on patient status, additional functional criteria and insurance authorization.  Follow Up Recommendations  Acute inpatient rehab (3hours/day)     Assistance Recommended at Discharge Frequent or constant Supervision/Assistance  Patient can return home with the following A little help with bathing/dressing/bathroom;A little help with walking and/or transfers;Assistance with cooking/housework;Assist for transportation   Equipment Recommendations  None recommended by PT    Recommendations for Other Services       Precautions / Restrictions Precautions Precautions: Fall Precaution Comments: back precautions and sternal bracing due to pain Restrictions Weight Bearing Restrictions: No     Mobility  Bed Mobility Overal bed mobility: Needs Assistance Bed Mobility: Rolling;Sidelying to Sit;Sit to Sidelying Rolling: Min assist Sidelying to sit: Min assist     Sit to sidelying: Min assist General bed mobility comments: light assist for log roll technique in and out of bed     Transfers Overall transfer level: Needs assistance Equipment used: Rolling walker (2 wheels) Transfers: Sit to/from Stand Sit to Stand: Min assist           General transfer comment: assist to transition hands from support surface to RW when standing/sitting. STS x2, from EOB and chair in hallway.    Ambulation/Gait Ambulation/Gait assistance: Min guard Gait Distance (Feet): 120 Feet (+80 - seated rest break in hallway x2 minutes) Assistive device: Rolling walker (2 wheels) Gait Pattern/deviations: Step-through pattern;Decreased stride length;Trunk flexed Gait velocity: decr     General Gait Details: cues for placement in RW, upright posture.   Stairs             Wheelchair Mobility    Modified Rankin (Stroke Patients Only)       Balance Overall balance assessment: Needs assistance;History of Falls Sitting-balance support: No upper extremity supported;Feet supported Sitting balance-[name]: Fair     Standing balance support: Bilateral upper extremity supported;Reliant on assistive device for balance Standing balance-[name]: Poor Standing balance comment: dependent on BUE, or external support during functional standing ADL                            Cognition Arousal/Alertness: Awake/alert Behavior During Therapy: WFL for tasks assessed/performed Overall Cognitive Status: Within Functional Limits for tasks assessed                                          Exercises      General Comments        Pertinent Vitals/Pain Pain Assessment: Faces Faces Pain Scale: Hurts little more Pain  Location: back, during bed mobility Pain Descriptors / Indicators: Discomfort;Grimacing Pain Intervention(s): Limited activity within patient's tolerance;Monitored during session;Repositioned    Home Living                          Prior Function            PT Goals (current goals can now be found in the care plan  section) Acute Rehab PT Goals Patient Stated Goal: independence PT Goal Formulation: With patient/family Time For Goal Achievement: 07/26/21 Potential to Achieve Goals: Good Progress towards PT goals: Progressing toward goals    Frequency    Min 4X/week      PT Plan Current plan remains appropriate    Co-evaluation              AM-PAC PT "6 Clicks" Mobility   Outcome Measure  Help needed turning from your back to your side while in a flat bed without using bedrails?: A Little Help needed moving from lying on your back to sitting on the side of a flat bed without using bedrails?: A Little Help needed moving to and from a bed to a chair (including a wheelchair)?: A Little Help needed standing up from a chair using your arms (e.g., wheelchair or bedside chair)?: A Little Help needed to walk in hospital room?: A Little Help needed climbing 3-5 steps with a railing? : A Lot 6 Click Score: 17    End of Session Equipment Utilized During Treatment: Gait belt Activity Tolerance: Patient tolerated treatment well;Patient limited by pain Patient left: with call bell/phone within reach;in bed;with bed alarm set Nurse Communication: Mobility status PT Visit Diagnosis: Other abnormalities of gait and mobility (R26.89);Difficulty in walking, not elsewhere classified (R26.2)     Time: [id number] PT Time Calculation (min) (ACUTE ONLY): 23 min  Charges:  $Gait Training: 8-22 mins $Therapeutic Activity: 8-22 mins                     Stacie Glaze, PT DPT Acute Rehabilitation Services Pager [phone]  Office [phone]    [place] 07/17/2021, 4:48 PM

## 2021-07-17 NOTE — H&P (Incomplete)
Physical Medicine and Rehabilitation Admission H&P    Chief Complaint  Patient presents with   Back Pain  : HPI: Natasha Harper is an 87 year old right-handed female with history of hyperlipidemia, CAD status post angioplasty maintained on low-dose aspirin, hypertension.  Per chart review patient lives with spouse.  Two-level home bed and bath main level.  Use rolling walker prior to admission.  Presented 07/04/2021 after reported fall her legs gave out.  She did not strike her head there was no loss of consciousness.  Husband reports she had a second fall while in the bathroom.  She denied any chest pain or shortness of breath associated with the fall.  Cranial CT scan negative.  CT angiography of the chest showed no evidence of pulmonary emboli.  There was an incidental finding of a 2.0 x 1.9 cm high attenuation lesion in the right breast plan outpatient follow-up.  CT lumbar cervical thoracic spine showed T1, T6 compression fractures with 10 to 20% height loss with no retropulsion or spinal canal stenosis..  Admission chemistry showed elevated troponin cardiology services felt to be related to demand ischemia.  Echocardiogram ejection fraction of 55 to 123456 grade 1 diastolic dysfunction no regional wall motion abnormalities.  Underwent T6 core bone biopsy and bone kyphoplasty 07/11/2021 per interventional radiology Dr.Macedo Debbrah Alar.  Routine spinal precautions.  Patient was cleared to begin subcutaneous heparin for DVT prophylaxis.  Bouts of hyponatremia maintained on sodium chloride tablet.  Therapy evaluations completed due to patient decreased functional mobility was admitted for a comprehensive rehab program.  Review of Systems  Constitutional:  Negative for chills and fever.  HENT:  Positive for hearing loss.   Eyes:  Negative for blurred vision and double vision.  Respiratory:  Negative for cough and shortness of breath.   Cardiovascular:  Positive for palpitations. Negative for chest  pain and leg swelling.  Gastrointestinal:  Positive for constipation. Negative for heartburn, nausea and vomiting.  Genitourinary:  Negative for dysuria, flank pain and hematuria.  Musculoskeletal:  Positive for back pain and myalgias.  Skin:  Negative for rash.  Neurological:  Positive for weakness.  All other systems reviewed and are negative. Past Medical History:  Diagnosis Date   CAD (coronary artery disease)    PCI to RCA and diagonal in remote past, residual 70% LAD  /   nuclear, 2007, no ischemia   Carotid arterial disease ([place])    Doppler, June, 2011, stable, [id number] R. ICA, [id number] LICA   Contact lens/glasses fitting    wears contacts or glasses   Dyslipidemia    Significant drop in LDL from Lipitor even though LDL remains high   Ejection fraction    EF 65%, nuclear, 2007   Fibrocystic breast    HTN (hypertension)     no med   Hypercholesterolemia    Hypothyroidism    Thyroid surgery in the past, thyroid nodules followed by Dr.Ellison   Lump or mass in breast 07/19/2007   Excised 01/12/13. B9 on pathQualifier: Diagnosis of  By: Regis Bill MD, Standley Brooking     Osteoporosis    Prominent abdominal aortic pulsation    No abdominal aneurysm by ultrasound   Rectal fissure    Past Surgical History:  Procedure Laterality Date   BREAST BIOPSY Right 01/12/2013   Procedure: Removal of right breast mass;  Surgeon: Haywood Lasso, MD;  Location: [facility];  Service: General;  Laterality: Right;   BREAST EXCISIONAL BIOPSY  9/14   scar  tissue from the needle biopsy   CORONARY ANGIOPLASTY  1993   IR KYPHO THORACIC WITH BONE BIOPSY  07/11/2021   RECTOPERITONEAL FISTULA CLOSURE     THYROID CYST EXCISION     Family History  Problem Relation Age of Onset   Heart attack Father    Osteoporosis Mother    Breast cancer Daughter    Social History:  reports that she quit smoking about 63 years ago. She has never used smokeless tobacco. She reports that she does not drink alcohol  and does not use drugs. Allergies:  Allergies  Allergen Reactions   Actonel [Risedronate Sodium]     bloating   Evista [Raloxifene]     Abdominal cramps   Fosamax [Alendronate Sodium]     Abdominal cramps   Medications Prior to Admission  Medication Sig Dispense Refill   acetaminophen (TYLENOL) 500 MG tablet Take 1,000 mg by mouth every 6 (six) hours as needed for moderate pain.     Ascorbic Acid (VITAMIN C) 100 MG tablet Take 100 mg by mouth daily.     atorvastatin (LIPITOR) 80 MG tablet TAKE 1 TABLET BY MOUTH DAILY (Patient taking differently: Take 80 mg by mouth daily.) 90 tablet 3   cholecalciferol (VITAMIN D3) 25 MCG (1000 UNIT) tablet Take 1,000 Units by mouth daily.     fish oil-omega-3 fatty acids 1000 MG capsule Take 1,000 mg by mouth daily.     lidocaine (LIDODERM) 5 % Place 1 patch onto the skin daily. Remove & Discard patch within 12 hours or as directed by MD (Patient taking differently: Place 1 patch onto the skin daily as needed (pain). Remove & Discard patch within 12 hours or as directed by MD) 30 patch 0   Menaquinone-7 (VITAMIN K2 PO) Take 1 tablet by mouth daily.     MULTIPLE VITAMIN PO Take 1 tablet by mouth daily.     SYNTHROID 50 MCG tablet TAKE 1 TABLET BY MOUTH DAILY (Patient taking differently: Take 50 mcg by mouth daily before breakfast.) 90 tablet 3   amLODipine (NORVASC) 5 MG tablet Take 1 tablet (5 mg total) by mouth daily. (Patient not taking: Reported on 07/06/2021) 30 tablet 3   aspirin 81 MG tablet Take 81 mg by mouth daily.     baclofen (LIORESAL) 10 MG tablet Take 0.5-1 tablets (5-10 mg total) by mouth 3 (three) times daily as needed for muscle spasms. (Patient not taking: Reported on [id number]) 30 each 3   folic acid (FOLVITE) 1 MG tablet Take 1 tablet (1 mg total) by mouth daily. (Patient not taking: Reported on 07/06/2021) 30 tablet 0   traMADol (ULTRAM) 50 MG tablet Take 1 tablet (50 mg total) by mouth every 6 (six) hours as needed for moderate pain.  (Patient not taking: Reported on 07/06/2021) 20 tablet 0    Drug Regimen Review Drug regimen was reviewed and remains appropriate with no significant issues identified  Home: Home Living Family/patient expects to be discharged to:: Private residence Living Arrangements: Spouse/significant other Available Help at Discharge: Family, Available 24 hours/day Type of Home: House Home Access: Level entry Home Layout: Two level, Able to live on main level with bedroom/bathroom Bathroom Shower/Tub: Multimedia programmer: Standard Home Equipment: Conservation officer, nature (2 wheels), Shower seat, Hand held shower head (lift chair) Additional Comments: spouse has a hernia and a "bad knee" so can provide only limited assistance  Lives With: Spouse   Functional History: Prior Function Prior Level of Function : Needs assist Mobility Comments:  pt requiring supervision for mobility, uses RW ADLs Comments: pt's spouse reports pt needs assist with wash up, per pt would like to be as independent as possible  Functional Status:  Mobility: Bed Mobility Overal bed mobility: Needs Assistance Bed Mobility: Rolling, Sidelying to Sit Rolling: Min assist Sidelying to sit: Min assist Supine to sit: Min assist Sit to supine: Mod assist Sit to sidelying: Mod assist, +2 for physical assistance General bed mobility comments: pt able to follow cues to complete pushing up with R elbow with sternal pain. pt requires (A) To walk BLE off bed surface, use of bed rail to assist with trunk elevation Transfers Overall transfer level: Needs assistance Equipment used: Rolling walker (2 wheels) Transfers: Sit to/from Stand Sit to Stand: Min assist Bed to/from chair/wheelchair/BSC transfer type:: Step pivot Step pivot transfers: Min assist, +2 physical assistance, +2 safety/equipment General transfer comment: assist for power up rise and steadying upon standing, good follow throughout with hand placement from previous  sessiohn Ambulation/Gait Ambulation/Gait assistance: Min assist Gait Distance (Feet): 100 Feet Assistive device: Rolling walker (2 wheels) Gait Pattern/deviations: Step-through pattern, Decreased stride length, Trunk flexed General Gait Details: assist to steady and maneuver RW, cues for upright posture multiple times, placement in RW. Gait velocity: decr    ADL: ADL Overall ADL's : Needs assistance/impaired Eating/Feeding: Minimal assistance, Bed level Grooming: Minimal assistance, Standing, Wash/dry hands Grooming Details (indicate cue type and reason): decreased activity tolerance for standing, leans against sink intermittently for balance Lower Body Bathing: Moderate assistance Upper Body Dressing : Minimal assistance Upper Body Dressing Details (indicate cue type and reason): to don hospital gown like robe Lower Body Dressing: Moderate assistance, Sitting/lateral leans Lower Body Dressing Details (indicate cue type and reason): pt is able to initiate figure 4 cross but unable to sustain on R side. Pt able to get L LE up easier into figure 4 but continues to have difficulty due to sternal pain Toilet Transfer: Minimal assistance, Ambulation, Rolling walker (2 wheels), Cueing for safety Toilet Transfer Details (indicate cue type and reason): min A for balance and for steering the RW Toileting- Clothing Manipulation and Hygiene: Maximal assistance, Sit to/from stand Toileting - Clothing Manipulation Details (indicate cue type and reason): static standing for peri care Functional mobility during ADLs: Rolling walker (2 wheels), Min guard General ADL Comments: sternal discomfort limites reaching task outside base of support  Cognition: Cognition Overall Cognitive Status: Within Functional Limits for tasks assessed Orientation Level: Oriented X4 Cognition Arousal/Alertness: Awake/alert Behavior During Therapy: WFL for tasks assessed/performed Overall Cognitive Status: Within  Functional Limits for tasks assessed General Comments: hoh Difficult to assess due to: Hard of hearing/deaf  Physical Exam: Blood pressure 137/65, pulse 72, temperature 98 F (36.7 C), temperature source Oral, resp. rate 20, height 5\' 1"  (1.549 m), weight 47 kg, last menstrual period 07/07/1997, SpO2 98 %. Physical Exam Neurological:     Comments: Patient is alert.  No acute distress.  She is very hard of hearing.  Husband at bedside.  Oriented x3.    Results for orders placed or performed during the hospital encounter of 07/04/21 (from the past 48 hour(s))  CBC with Differential/Platelet     Status: Abnormal   Collection Time: 07/16/21  8:42 AM  Result Value Ref Range   WBC 3.8 (L) 4.0 - 10.5 K/uL   RBC 3.03 (L) 3.87 - 5.11 MIL/uL   Hemoglobin 10.3 (L) 12.0 - 15.0 g/dL   HCT 31.1 (L) 36.0 - 46.0 %   MCV  102.6 (H) 80.0 - 100.0 fL   MCH 34.0 26.0 - 34.0 pg   MCHC 33.1 30.0 - 36.0 g/dL   RDW 13.2 11.5 - 15.5 %   Platelets 427 (H) 150 - 400 K/uL   nRBC 0.0 0.0 - 0.2 %   Neutrophils Relative % 64 %   Neutro Abs 2.4 1.7 - 7.7 K/uL   Lymphocytes Relative 25 %   Lymphs Abs 1.0 0.7 - 4.0 K/uL   Monocytes Relative 8 %   Monocytes Absolute 0.3 0.1 - 1.0 K/uL   Eosinophils Relative 2 %   Eosinophils Absolute 0.1 0.0 - 0.5 K/uL   Basophils Relative 1 %   Basophils Absolute 0.0 0.0 - 0.1 K/uL   Immature Granulocytes 0 %   Abs Immature Granulocytes 0.01 0.00 - 0.07 K/uL    Comment: Performed at [facility] [address] [place], [place] [id number]  Basic metabolic panel     Status: Abnormal   Collection Time: 07/16/21  8:42 AM  Result Value Ref Range   Sodium 133 (L) 135 - 145 mmol/L   Potassium 3.5 3.5 - 5.1 mmol/L   Chloride 100 98 - 111 mmol/L   CO2 26 22 - 32 mmol/L   Glucose, Bld 121 (H) 70 - 99 mg/dL    Comment: Glucose reference range applies only to samples taken after fasting for at least 8 hours.   BUN 8 8 - 23 mg/dL   Creatinine, Ser 0.41 (L) 0.44 - 1.00 mg/dL    Calcium 8.7 (L) 8.9 - 10.3 mg/dL   GFR, Estimated >60 >60 mL/min    Comment: (NOTE) Calculated using the CKD-EPI Creatinine Equation (2021)    Anion gap 7 5 - 15    Comment: Performed at [facility] [address], [place], [place] [address]   No results found.     Medical Problem List and Plan: 1. Functional deficits secondary to T1, T6 compression fracture with 10 to 20% height loss with no retropulsion or spinal canal stenosis.  Status post T6 core bone biopsy and kyphoplasty 07/11/2021.  Routine back precautions  -patient may *** shower  -ELOS/Goals: *** 2.  Antithrombotics: -DVT/anticoagulation:  Pharmaceutical: Heparin check vascular study  -antiplatelet therapy: Aspirin 81 mg daily 3. Pain Management: Tylenol as needed 4. Mood: Vita motional support  -antipsychotic agents: N/A 5. Neuropsych: This patient is capable of making decisions on her own behalf. 6. Skin/Wound Care: Routine skin checks 7. Fluids/Electrolytes/Nutrition: Routine in and outs with follow-up chemistries 8.  CAD with history of PCI.  Continue aspirin.  Follow-up cardiology services as needed 9.  Hyperlipidemia.  Lipitor 10.  Hypertension.  Cozaar 50 mg daily Mast 10 mg daily Lopressor 12.5 mg BID 11.  Hypothyroidism.  Synthroid 12.  Incidental finding 2.0 x 1.9 cm right attenuation lesion right breast on CT.  Follow-up outpatient   Cathlyn Parsons, PA-C 07/17/2021

## 2021-07-17 NOTE — Progress Notes (Signed)
Inpatient Rehab Admissions Coordinator:   I do not yet have insurance auth or a CIR bed. Continue to follow for potential CIR admit pending insurance auth.  Megan Salon, MS, CCC-SLP Rehab Admissions Coordinator  [phone] (celll) [phone] (office)

## 2021-07-18 NOTE — Progress Notes (Signed)
Mobility Specialist Progress Note:   07/18/21 1230  Mobility  Activity Ambulated in hall  Level of Assistance Minimal assist, patient does 75% or more  Assistive Device Front wheel walker  Distance Ambulated (ft) 140 ft  Mobility Ambulated with assistance in hallway  Mobility Response Tolerated well  Mobility performed by Mobility specialist  $Mobility charge 1 Mobility   Pt eager for OOB mobility. Required minA to stand from EOB, min guard throughout ambulation. Asx during ambulation, pt c/o sternal pain post-mobility. Pt back in bed with bed alarm on.   Addison Lank Mobility Specialist  Phone [phone]

## 2021-07-18 NOTE — Progress Notes (Signed)
PROGRESS NOTE    Natasha Harper  [id number] DOB: 29-Jun-1935 DOA: 07/04/2021 PCP: Burnis Medin, MD   Brief Narrative:  Patient is a 87 year old female with history of hypothyroidism, hyperlipidemia, coronary artery disease, hypertension, very hard of hearing who presented with back pain and falls from home.  On presentation CT of the thoracic spine showed T1 and T6 compression fracture, lab work showed elevated troponin.  Started on heparin drip on admission but then discontinued by cardiology because elevated troponin thought to be from demand ischemia.  Underwent kyphoplasty. Unfortunately IR was not able to visualize T1 well enough for kyphoplasty to be performed despite multiple attempts to reposition her. However, the procedure was successful as performed on T6. The patient has been evaluated by PT/OT. She is felt to be a candidate for CIR. Rehab has been consulted to further evaluate her.  We are waiting for bed availability and insurance authorization.    Assessment & Plan:   Principal Problem:   Thoracic compression fracture (HCC) Active Problems:   Elevated troponin   Demand ischemia (HCC)   Primary hypertension  Fall/thoracic vertebrae compression fracture: CT thoracic spine showed T1, T6 compression fracture with 10 to 20% height loss with no retropulsion or spinal canal stenosis.  Underwent kyphoplasty on 07/11/2020. Unfortunately IR was not able to visualize T1 well enough for kyphoplasty to be performed despite multiple attempts to reposition her. However, the procedure was successful as performed on T6. The patient has been evaluated by PT/OT. She is felt to be a candidate for CIR. Rehab has been consulted to further evaluate her.  Per CIR coordinator note, patient was denied from insurance for CIR admission and family is appealing the decision.   history of coronary artery disease/elevated troponin: Troponin was elevated troponin in the range of 2K but flat trended. She  has history of coronary artery disease status post PCI to RCA and diagonal in 1993 .  Has residual 70% LAD lesion. Echo done here showed left ventricular ejection fraction of 55-60%, normal regional wall motion.  Cardiology consulted.  Elevated troponin felt to be from demand ischemia.  Patient currently denies any chest pain.  Heparin drip discontinued.  No plans for cardiac cath.     Hypertension: Pt is currently normotensive on losartan and metoprolol which have been continued as he takes them at home and amlodipine which was added  as inpatient to better control her blood pressure.   Hyperlipidemia: On Lipitor   Right breast abnormal imaging: As seen on CTA: 2X 1.9 cm high-attenuation lesion in the right. She has breast lumps for years and was following with GYN, had mammograms, excisional biopsy of one of her breast was found to be benign.  She does not want further investigation.  If she wishes, outpatient mammogram/breast ultrasound recommended as outpatient to exclude neoplasm of breast   Elevated ALP: GGT elevated, ALP elevated.  Abdominal ultrasound did not show any abnormality.  Could be associated with pathology with bones with possible underlying breast cancer.  Can be monitored as an outpatient.   Macrocytic anemia: Currently hemoglobin stable in the range of 9.  Iron studies showed normal iron.  Normal vitamin 123456 and folic acid   Hyponatremia: Stable.  DVT prophylaxis: heparin injection 5,000 Units Start: 07/11/21 2200   Code Status: Full Code  Family Communication: Husband present at bedside.  Plan of care discussed with patient in length and he/she verbalized understanding and agreed with it.  Status is: Inpatient  Remains inpatient appropriate  because: Awaiting bed placement at CIR  Estimated body mass index is 19.66 kg/m as calculated from the following:   Height as of this encounter: 5\' 1"  (1.549 m).   Weight as of this encounter: 47.2 kg.    Nutritional  Assessment: Body mass index is 19.66 kg/m.Marland Kitchen Seen by dietician.  I agree with the assessment and plan as outlined below: Nutrition Status:  Skin Assessment: I have examined the patient's skin and I agree with the wound assessment as performed by the wound care RN as outlined below:    Consultants:  IR  Procedures:  None  Antimicrobials:  Anti-infectives (From admission, onward)    Start     Dose/Rate Route Frequency Ordered Stop   07/11/21 0800  ceFAZolin (ANCEF) IVPB 2g/100 mL premix        2 g 200 mL/hr over 30 Minutes Intravenous To Radiology 07/10/21 1055 07/11/21 0900         Subjective: Seen and examined.  She has no complaints.  Husband at the bedside.  Objective: Vitals:   07/18/21 0321 07/18/21 0500 07/18/21 0759 07/18/21 1142  BP: (!) 171/76  (!) 146/71 125/60  Pulse: 78  73 71  Resp: 19  20 13   Temp: 98 F (36.7 C)  97.8 F (36.6 C) 98.1 F (36.7 C)  TempSrc: Oral  Oral Oral  SpO2: 100%  96% 95%  Weight:  47.2 kg    Height:        Intake/Output Summary (Last 24 hours) at 07/18/2021 1300 Last data filed at 07/18/2021 0800 Gross per 24 hour  Intake --  Output 1200 ml  Net -1200 ml    Filed Weights   07/16/21 0500 07/17/21 0500 07/18/21 0500  Weight: 46.8 kg 47 kg 47.2 kg    Examination:  General exam: Appears calm and comfortable  Respiratory system: Clear to auscultation. Respiratory effort normal. Cardiovascular system: S1 & S2 heard, RRR. No JVD, murmurs, rubs, gallops or clicks. No pedal edema. Gastrointestinal system: Abdomen is nondistended, soft and nontender. No organomegaly or masses felt. Normal bowel sounds heard. Central nervous system: Alert and oriented. No focal neurological deficits. Extremities: Symmetric 5 x 5 power. Skin: No rashes, lesions or ulcers.  Psychiatry: Judgement and insight appear normal. Mood & affect appropriate.   Data Reviewed: I have personally reviewed following labs and imaging studies  CBC: Recent  Labs  Lab 07/16/21 0842  WBC 3.8*  NEUTROABS 2.4  HGB 10.3*  HCT 31.1*  MCV 102.6*  PLT 427*    Basic Metabolic Panel: Recent Labs  Lab 07/12/21 0327 07/13/21 0416 07/15/21 0359 07/16/21 0842  NA 129* 129* 130* 133*  K 3.5 3.5 3.6 3.5  CL 98 100 97* 100  CO2 24 24 26 26   GLUCOSE 91 98 90 121*  BUN 13 14 12 8   CREATININE 0.34* 0.37* 0.36* 0.41*  CALCIUM 8.4* 8.8* 8.6* 8.7*    GFR: Estimated Creatinine Clearance: 39 mL/min (A) (by C-G formula based on SCr of 0.41 mg/dL (L)). Liver Function Tests: No results for input(s): AST, ALT, ALKPHOS, BILITOT, PROT, ALBUMIN in the last 168 hours. No results for input(s): LIPASE, AMYLASE in the last 168 hours. No results for input(s): AMMONIA in the last 168 hours. Coagulation Profile: No results for input(s): INR, PROTIME in the last 168 hours. Cardiac Enzymes: No results for input(s): CKTOTAL, CKMB, CKMBINDEX, TROPONINI in the last 168 hours. BNP (last 3 results) No results for input(s): PROBNP in the last 8760 hours. HbA1C: No results  for input(s): HGBA1C in the last 72 hours. CBG: No results for input(s): GLUCAP in the last 168 hours. Lipid Profile: No results for input(s): CHOL, HDL, LDLCALC, TRIG, CHOLHDL, LDLDIRECT in the last 72 hours. Thyroid Function Tests: No results for input(s): TSH, T4TOTAL, FREET4, T3FREE, THYROIDAB in the last 72 hours. Anemia Panel: No results for input(s): VITAMINB12, FOLATE, FERRITIN, TIBC, IRON, RETICCTPCT in the last 72 hours. Sepsis Labs: No results for input(s): PROCALCITON, LATICACIDVEN in the last 168 hours.  No results found for this or any previous visit (from the past 240 hour(s)).   Radiology Studies: No results found.  Scheduled Meds:  amLODipine  10 mg Oral Daily   aspirin EC  81 mg Oral Daily   atorvastatin  80 mg Oral Daily   heparin  5,000 Units Subcutaneous Q8H   levothyroxine  50 mcg Oral Q0600   losartan  50 mg Oral Daily   mouth rinse  15 mL Mouth Rinse BID    metoprolol tartrate  12.5 mg Oral BID   polyethylene glycol  17 g Oral Daily   sodium chloride  1 g Oral BID WC   Continuous Infusions:   LOS: 13 days   Time spent: 23 minutes  Darliss Cheney, MD [facility]  07/18/2021, 1:00 PM  Please page via [place] and do not message via secure chat for anything urgent. Secure chat can be used for anything non urgent.  How to contact the [facility] Attending or Consulting provider [place] or covering provider during after hours [place], for this patient?  Check the care team in [facility] and look for a) attending/consulting [facility] provider listed and b) the [facility] team listed. Page or secure chat [phone]. Log into [email or web address] and use [place]'s universal password to access. If you do not have the password, please contact the hospital operator. Locate the [facility] provider you are looking for under [facility] and page to a number that you can be directly reached. If you still have difficulty reaching the provider, please page the [facility] (Director on Call) for the Hospitalists listed on amion for assistance.

## 2021-07-18 NOTE — Progress Notes (Signed)
Physical Therapy Treatment Patient Details Name: Natasha Harper MRN: [id number] DOB: [birth year] Today's Date: 07/18/2021   History of Present Illness 87 yo female presents to ED on 12/29 with x2 falls, back pain sustaining T1, T6 thoracic compression fractures. Pt also with elevated troponins, NSTEMI, cards following. CTH negative for acute findings. s/p FLUOROSCOPY GUIDED T6 CORE BONE AND BALLOON KYPHOPLASTY  on 1/5. PMH includes CAD with angioplasty 1993, HTN, osteoporosis.    PT Comments    Pt endorses fatigue, but agreeable to OOB mobility. Pt mobilizing at min assist level overall, ambulatory for good hallway distance. Pt and family express interest in SNF if pt cannot go to CIR, as pt's husband cannot physically assist her.     Recommendations for follow up therapy are one component of a multi-disciplinary discharge planning process, led by the attending physician.  Recommendations may be updated based on patient status, additional functional criteria and insurance authorization.  Follow Up Recommendations  Acute inpatient rehab (3hours/day) (vs SNF if appeal for CIR admission is denied)     Assistance Recommended at Discharge Frequent or constant Supervision/Assistance  Patient can return home with the following A little help with bathing/dressing/bathroom;A little help with walking and/or transfers;Assistance with cooking/housework;Assist for transportation   Equipment Recommendations  None recommended by PT    Recommendations for Other Services       Precautions / Restrictions Precautions Precautions: Fall Precaution Comments: back precautions and sternal bracing due to pain     Mobility  Bed Mobility Overal bed mobility: Needs Assistance Bed Mobility: Rolling;Sidelying to Sit;Sit to Sidelying Rolling: Min guard Sidelying to sit: Min guard Supine to sit: Min assist     General bed mobility comments: assist for lifting LEs back into bed    Transfers Overall  transfer level: Needs assistance Equipment used: Rolling walker (2 wheels) Transfers: Sit to/from Stand Sit to Stand: Min assist           General transfer comment: assist to transition hands from support surface to RW when standing/sitting. STS x2, from EOB and chair in hallway.    Ambulation/Gait Ambulation/Gait assistance: Min guard Gait Distance (Feet): 120 Feet Assistive device: Rolling walker (2 wheels) Gait Pattern/deviations: Step-through pattern;Decreased stride length;Trunk flexed Gait velocity: decr     General Gait Details: cues for placement in RW, upright posture.   Stairs             Wheelchair Mobility    Modified Rankin (Stroke Patients Only)       Balance Overall balance assessment: Needs assistance;History of Falls Sitting-balance support: No upper extremity supported;Feet supported Sitting balance-[name]: Fair     Standing balance support: Bilateral upper extremity supported;Reliant on assistive device for balance Standing balance-[name]: Poor                              Cognition Arousal/Alertness: Awake/alert Behavior During Therapy: WFL for tasks assessed/performed Overall Cognitive Status: Within Functional Limits for tasks assessed                                 General Comments: periods of irritability during session        Exercises      General Comments        Pertinent Vitals/Pain Pain Assessment: Faces Faces Pain Scale: Hurts a little bit Pain Location: back, during bed mobility Pain Descriptors / Indicators:  Discomfort;Grimacing Pain Intervention(s): Limited activity within patient's tolerance;Monitored during session;Premedicated before session;Repositioned    Home Living                          Prior Function            PT Goals (current goals can now be found in the care plan section) Acute Rehab PT Goals Patient Stated Goal: independence PT Goal  Formulation: With patient/family Time For Goal Achievement: 07/26/21 Potential to Achieve Goals: Good Progress towards PT goals: Progressing toward goals    Frequency    Min 4X/week      PT Plan Current plan remains appropriate    Co-evaluation              AM-PAC PT "6 Clicks" Mobility   Outcome Measure  Help needed turning from your back to your side while in a flat bed without using bedrails?: A Little Help needed moving from lying on your back to sitting on the side of a flat bed without using bedrails?: A Little Help needed moving to and from a bed to a chair (including a wheelchair)?: A Little Help needed standing up from a chair using your arms (e.g., wheelchair or bedside chair)?: A Little Help needed to walk in hospital room?: A Little Help needed climbing 3-5 steps with a railing? : A Lot 6 Click Score: 17    End of Session Equipment Utilized During Treatment: Gait belt Activity Tolerance: Patient tolerated treatment well;Patient limited by pain Patient left: with call bell/phone within reach;in bed;with bed alarm set Nurse Communication: Mobility status PT Visit Diagnosis: Other abnormalities of gait and mobility (R26.89);Difficulty in walking, not elsewhere classified (R26.2)     Time: [id number] PT Time Calculation (min) (ACUTE ONLY): 14 min  Charges:  $Gait Training: 8-22 mins                    Stacie Glaze, PT DPT Acute Rehabilitation Services Pager [phone]  Office [phone]    [place] 07/18/2021, 5:51 PM

## 2021-07-18 NOTE — Progress Notes (Signed)
Mobility Specialist Progress Note   07/18/21 1045  Mobility  Activity Refused mobility   Pt feels more comfortable and would prefer a woman to work w/ them during their rehab sessions. RN notified and other mobility specialist contacted to see if pt could be seen before 1pm if time permits.     Frederico Hamman Mobility Specialist Phone Number [phone]

## 2021-07-18 NOTE — Progress Notes (Signed)
IP rehab admissions - I received a denial from [facility] for inpatient rehab admission.  I spoke to patient and her husband at her bedside.  They would like to appeal the decision.  Patient would like to come to inpatient rehab here at [facility].  I will contact insurance carrier to begin the appeal process.  Call for questions.  [phone]

## 2021-07-19 NOTE — [place] FL2 (Signed)
[place] MEDICAID FL2 LEVEL OF CARE SCREENING TOOL     IDENTIFICATION  Patient Name: Natasha Harper Birthdate: 07-Jun-1935 Sex: female Admission Date (Current Location): 07/04/2021  [facility] and [place] Number:  Producer, television/film/video and Address:  [facility] [place], [address] [place], [place] [address]      Provider Number: [id number]  Attending Physician Name and Address:  Hughie Closs, MD  Relative Name and Phone Number:  Gurney,Peter (Spouse)   [phone] (Home Phone)    Current Level of Care: Hospital Recommended Level of Care: Skilled Nursing Facility Prior Approval Number:    Date Approved/Denied:   PASRR Number: [id number]  Discharge Plan: SNF    Current Diagnoses: Patient Active Problem List   Diagnosis Date Noted   Primary hypertension    Elevated troponin    Demand ischemia [facility])    Thoracic compression fracture (HCC) 07/05/2021   Vertebral fracture, osteoporotic (HCC) 04/15/2021   Hyponatremia 04/14/2021   Hypokalemia 04/14/2021   Anemia 04/14/2021   Compression fracture of thoracic vertebra, closed, initial encounter (HCC) 04/13/2021   Sacral fracture, closed (HCC) 04/13/2021   Compression fracture of thoracic vertebra (HCC) 02/06/2021   Compression of lumbar vertebra (HCC) 02/06/2021   Back pain 02/05/2021   Medicare annual wellness visit, subsequent 12/20/2014   Decreased hearing 10/05/2013   Dyslipidemia    Carotid arterial disease (HCC)    Hypothyroidism    Ejection fraction    Visit for preventive health examination 06/02/2011   Medication side effect 06/02/2011   Disorder of bone and cartilage 04/07/2008   HYPERTENSION, WHITE COAT 04/07/2008   HYPERGLYCEMIA, FASTING 07/21/2007   THYROID NODULE 04/05/2007    Orientation RESPIRATION BLADDER Height & Weight     Self, Time, Situation, Place  Normal External catheter Weight: 104 lb 0.9 oz (47.2 kg) Height:  5\' 1"  (154.9 cm)  BEHAVIORAL SYMPTOMS/MOOD  NEUROLOGICAL BOWEL NUTRITION STATUS      Continent Diet (See d/c summary)  AMBULATORY STATUS COMMUNICATION OF NEEDS Skin   Extensive Assist Verbally Surgical wounds (Incision upper vertebral collumn)                       Personal Care Assistance Level of Assistance  Bathing, Feeding, Dressing Bathing Assistance: Maximum assistance Feeding assistance: Independent Dressing Assistance: Maximum assistance     Functional Limitations Info  Sight, Hearing, Speech Sight Info: Adequate Hearing Info: Impaired Speech Info: Adequate    SPECIAL CARE FACTORS FREQUENCY  PT (By licensed PT), OT (By licensed OT)     PT Frequency: 5x/week OT Frequency: 5x/week            Contractures Contractures Info: Not present    Additional Factors Info  Code Status, Allergies Code Status Info: Full code Allergies Info: Actonel (risedronate Sodium), Evista (raloxifene), Fosamax (alendronate Sodium)           Current Medications (07/19/2021):  This is the current hospital active medication list Current Facility-Administered Medications  Medication Dose Route Frequency Provider Last Rate Last Admin   acetaminophen (TYLENOL) tablet 650 mg  650 mg Oral Q6H PRN 07/21/2021, Tyrone A, DO   650 mg at 07/19/21 1200   Or   acetaminophen (TYLENOL) suppository 650 mg  650 mg Rectal Q6H PRN 07/21/21, Tyrone A, DO       amLODipine (NORVASC) tablet 10 mg  10 mg Oral Daily Ronaldo Miyamoto, MD   10 mg at 07/19/21 0932   aspirin EC tablet 81 mg  81 mg Oral Daily Kyle, Tyrone A, DO   81 mg at 07/19/21 0932   atorvastatin (LIPITOR) tablet 80 mg  80 mg Oral Daily Kyle, Tyrone A, DO   80 mg at 07/19/21 0932   bupivacaine (MARCAINE) 0.5 % injection    PRN de Melchor Amour, Jerilynn Mages, MD   10 mL at 07/11/21 0848   fentaNYL (SUBLIMAZE) injection 50 mcg  50 mcg Intravenous Q2H PRN Margie Ege A, DO   50 mcg at 07/05/21 1605   fentaNYL (SUBLIMAZE) injection   Intravenous PRN de Melchor Amour, Jerilynn Mages, MD   12.5 mcg at  07/11/21 0918   heparin injection 5,000 Units  5,000 Units Subcutaneous Q8H Gerilyn Nestle, RPH   5,000 Units at 07/19/21 1452   hydrALAZINE (APRESOLINE) injection 10 mg  10 mg Intravenous Q6H PRN Burnadette Pop, MD   10 mg at 07/08/21 1847   labetalol (NORMODYNE) injection    PRN de Melchor Amour, Jerilynn Mages, MD   5 mg at 07/11/21 0941   levothyroxine (SYNTHROID) tablet 50 mcg  50 mcg Oral Q0600 Swayze, Ava, DO   50 mcg at 07/19/21 0553   lidocaine (PF) (XYLOCAINE) 1 % injection    PRN de Melchor Amour, Jerilynn Mages, MD   10 mL at 07/11/21 0848   losartan (COZAAR) tablet 50 mg  50 mg Oral Daily Nahser, Deloris Ping, MD   50 mg at 07/19/21 0932   MEDLINE mouth rinse  15 mL Mouth Rinse BID Burnadette Pop, MD   15 mL at 07/18/21 0901   metoprolol tartrate (LOPRESSOR) injection 5 mg  5 mg Intravenous Q6H PRN Ronaldo Miyamoto, Tyrone A, DO   5 mg at 07/08/21 0416   metoprolol tartrate (LOPRESSOR) tablet 12.5 mg  12.5 mg Oral BID Bhagat, Bhavinkumar, PA   12.5 mg at 07/19/21 0932   midazolam (VERSED) injection   Intravenous PRN de Melchor Amour, Jerilynn Mages, MD   0.5 mg at 07/11/21 0926   naloxone [facility]) injection 0.4 mg  0.4 mg Intravenous PRN Ronaldo Miyamoto, Tyrone A, DO       ondansetron (ZOFRAN) injection 4 mg  4 mg Intravenous Q6H PRN Ronaldo Miyamoto, Tyrone A, DO       polyethylene glycol (MIRALAX / GLYCOLAX) packet 17 g  17 g Oral Daily Adhikari, Amrit, MD   17 g at 07/15/21 0830   sodium chloride tablet 1 g  1 g Oral BID WC Swayze, Ava, DO   1 g at 07/19/21 0932     Discharge Medications: Please see discharge summary for a list of discharge medications.  Relevant Imaging Results:  Relevant Lab Results:   Additional Information SS# [id number];  COVID vaccine x 2 and booster x 1  Lelon Ikard P Ayanna Gheen, LCSW

## 2021-07-19 NOTE — Progress Notes (Signed)
PROGRESS NOTE    Natasha Harper  OEV:[id number] DOB: Nov 08, 1934 DOA: 07/04/2021 PCP: [name], MD   Brief Narrative:  Patient is a 87 year old female with history of hypothyroidism, hyperlipidemia, coronary artery disease, hypertension, very hard of hearing who presented with back pain and falls from home.  On presentation CT of the thoracic spine showed T1 and T6 compression fracture, lab work showed elevated troponin.  Started on heparin drip on admission but then discontinued by cardiology because elevated troponin thought to be from demand ischemia.  Underwent kyphoplasty. Unfortunately IR was not able to visualize T1 well enough for kyphoplasty to be performed despite multiple attempts to reposition her. However, the procedure was successful as performed on T6. The patient has been evaluated by PT/OT. She is felt to be a candidate for CIR however insurance declined, they appealed but the decision was upheld.  Pursuing SNF now.    Assessment & Plan:   Principal Problem:   Thoracic compression fracture (HCC) Active Problems:   Elevated troponin   Demand ischemia (HCC)   Primary hypertension  Fall/thoracic vertebrae compression fracture: CT thoracic spine showed T1, T6 compression fracture with 10 to 20% height loss with no retropulsion or spinal canal stenosis.  Underwent kyphoplasty on 07/11/2020. Unfortunately IR was not able to visualize T1 well enough for kyphoplasty to be performed despite multiple attempts to reposition her. However, the procedure was successful as performed on T6. The patient has been evaluated by PT/OT. She is felt to be a candidate for CIR however unfortunately patient was denied from insurance for CIR admission and family appealed but the decision was upheld.  TOC aware and SNF is being pursued now.   history of coronary artery disease/elevated troponin: Troponin was elevated troponin in the range of 2K but flat trended. She has history of coronary artery  disease status post PCI to RCA and diagonal in 1993 .  Has residual 70% LAD lesion. Echo done here showed left ventricular ejection fraction of 55-60%, normal regional wall motion.  Cardiology consulted.  Elevated troponin felt to be from demand ischemia.  Patient currently denies any chest pain.  Heparin drip discontinued.  No plans for cardiac cath.     Hypertension: Pt is currently normotensive on losartan and metoprolol which have been continued as he takes them at home and amlodipine which was added  as inpatient to better control her blood pressure.   Hyperlipidemia: On Lipitor   Right breast abnormal imaging: As seen on CTA: 2X 1.9 cm high-attenuation lesion in the right. She has breast lumps for years and was following with GYN, had mammograms, excisional biopsy of one of her breast was found to be benign.  She does not want further investigation.  If she wishes, outpatient mammogram/breast ultrasound recommended as outpatient to exclude neoplasm of breast   Elevated ALP: GGT elevated, ALP elevated.  Abdominal ultrasound did not show any abnormality.  Could be associated with pathology with bones with possible underlying breast cancer.  Can be monitored as an outpatient.   Macrocytic anemia: Currently hemoglobin stable in the range of 9.  Iron studies showed normal iron.  Normal vitamin B12 and folic acid   Hyponatremia: Stable.  DVT prophylaxis: heparin injection 5,000 Units Start: 07/11/21 2200   Code Status: Full Code  Family Communication: Husband present at bedside.  Plan of care discussed with patient in length and he/she verbalized understanding and agreed with it.  Status is: Inpatient  Remains inpatient appropriate because[facility]  Estimated body mass index is 19.66 kg/m as calculated from the following:   Height as of this encounter: 5\' 1"  (1.549 m).   Weight as of this encounter: 47.2 kg.    Nutritional Assessment: Body mass index is 19.66  kg/m.Marland Kitchen. Seen by dietician.  I agree with the assessment and plan as outlined below: Nutrition Status:  Skin Assessment: I have examined the patient's skin and I agree with the wound assessment as performed by the wound care RN as outlined below:    Consultants:  IR  Procedures:  None  Antimicrobials:  Anti-infectives (From admission, onward)    Start     Dose/Rate Route Frequency Ordered Stop   07/11/21 0800  ceFAZolin (ANCEF) IVPB 2g/100 mL premix        2 g 200 mL/hr over 30 Minutes Intravenous To Radiology 07/10/21 1055 07/11/21 0900         Subjective: Seen and examined.  No new complaint.  Objective: Vitals:   07/19/21 0500 07/19/21 0800 07/19/21 0900 07/19/21 1125  BP:  (!) 158/64  (!) 139/59  Pulse:  77 83 69  Resp:  (!) 31 (!) 22 (!) 24  Temp:    97.8 F (36.6 C)  TempSrc:    Oral  SpO2:  96% 97% 96%  Weight: 47.2 kg     Height:        Intake/Output Summary (Last 24 hours) at 07/19/2021 1433 Last data filed at 07/19/2021 0540 Gross per 24 hour  Intake --  Output 1000 ml  Net -1000 ml    Filed Weights   07/17/21 0500 07/18/21 0500 07/19/21 0500  Weight: 47 kg 47.2 kg 47.2 kg    Examination:  General exam: Appears calm and comfortable, very hard of hearing. Respiratory system: Clear to auscultation. Respiratory effort normal. Cardiovascular system: S1 & S2 heard, RRR. No JVD, murmurs, rubs, gallops or clicks. No pedal edema. Gastrointestinal system: Abdomen is nondistended, soft and nontender. No organomegaly or masses felt. Normal bowel sounds heard. Central nervous system: Alert and oriented. No focal neurological deficits. Extremities: Symmetric 5 x 5 power. Skin: No rashes, lesions or ulcers.    Data Reviewed: I have personally reviewed following labs and imaging studies  CBC: Recent Labs  Lab 07/16/21 0842  WBC 3.8*  NEUTROABS 2.4  HGB 10.3*  HCT 31.1*  MCV 102.6*  PLT 427*    Basic Metabolic Panel: Recent Labs  Lab  07/13/21 0416 07/15/21 0359 07/16/21 0842  NA 129* 130* 133*  K 3.5 3.6 3.5  CL 100 97* 100  CO2 24 26 26   GLUCOSE 98 90 121*  BUN 14 12 8   CREATININE 0.37* 0.36* 0.41*  CALCIUM 8.8* 8.6* 8.7*    GFR: Estimated Creatinine Clearance: 39 mL/min (A) (by C-G formula based on SCr of 0.41 mg/dL (L)). Liver Function Tests: No results for input(s): AST, ALT, ALKPHOS, BILITOT, PROT, ALBUMIN in the last 168 hours. No results for input(s): LIPASE, AMYLASE in the last 168 hours. No results for input(s): AMMONIA in the last 168 hours. Coagulation Profile: No results for input(s): INR, PROTIME in the last 168 hours. Cardiac Enzymes: No results for input(s): CKTOTAL, CKMB, CKMBINDEX, TROPONINI in the last 168 hours. BNP (last 3 results) No results for input(s): PROBNP in the last 8760 hours. HbA1C: No results for input(s): HGBA1C in the last 72 hours. CBG: No results for input(s): GLUCAP in the last 168 hours. Lipid Profile: No results for input(s): CHOL, HDL, LDLCALC, TRIG, CHOLHDL,  LDLDIRECT in the last 72 hours. Thyroid Function Tests: No results for input(s): TSH, T4TOTAL, FREET4, T3FREE, THYROIDAB in the last 72 hours. Anemia Panel: No results for input(s): VITAMINB12, FOLATE, FERRITIN, TIBC, IRON, RETICCTPCT in the last 72 hours. Sepsis Labs: No results for input(s): PROCALCITON, LATICACIDVEN in the last 168 hours.  No results found for this or any previous visit (from the past 240 hour(s)).   Radiology Studies: No results found.  Scheduled Meds:  amLODipine  10 mg Oral Daily   aspirin EC  81 mg Oral Daily   atorvastatin  80 mg Oral Daily   heparin  5,000 Units Subcutaneous Q8H   levothyroxine  50 mcg Oral Q0600   losartan  50 mg Oral Daily   mouth rinse  15 mL Mouth Rinse BID   metoprolol tartrate  12.5 mg Oral BID   polyethylene glycol  17 g Oral Daily   sodium chloride  1 g Oral BID WC   Continuous Infusions:   LOS: 14 days   Time spent: 22 minutes  Hughie Closs, MD [facility]  07/19/2021, 2:33 PM  Please page via Amion and do not message via secure chat for anything urgent. Secure chat can be used for anything non urgent.  How to contact the [facility] Attending or Consulting provider 7A - 7P or covering provider during after hours 7P -7A, for this patient?  Check the care team in [facility] and look for a) attending/consulting [facility] provider listed and b) the [facility] team listed. Page or secure chat 7A-7P. Log into [email or web address] and use [place]'s universal password to access. If you do not have the password, please contact the hospital operator. Locate the [facility] provider you are looking for under [facility] and page to a number that you can be directly reached. If you still have difficulty reaching the provider, please page the [facility] (Director on Call) for the Hospitalists listed on amion for assistance.

## 2021-07-19 NOTE — TOC Initial Note (Signed)
Transition of Care [facility]) - Initial/Assessment Note    Patient Details  Name: Natasha Harper MRN: [id number] Date of Birth: April 10, 1935  Transition of Care [facility]) CM/SW Contact:    Vinie Sill, LCSW Phone Number: 07/19/2021, 3:31 PM  Clinical Narrative:                  CSW met with patient at bedside. CSW introduced self and explained role. CSW discussed short term rehab at [facility]. Patient states she is agreeable to rehab at [facility]. CSW explained the SNF process. No preferred SNF at this time.   TOC will provide bed offers once available TOC will continue to follow and assist with discharge planning.  Thurmond Butts, MSW, LCSW Clinical Social Worker     Expected Discharge Plan: Skilled Nursing Facility Barriers to Discharge: Insurance Authorization, Continued Medical Work up, SNF Pending bed offer   Patient Goals and CMS Choice Patient states their goals for this hospitalization and ongoing recovery are:: Patient wants to go to rehab CMS Medicare.gov Compare Post Acute Care list provided to::  (n/a) Choice offered to / list presented to : NA  Expected Discharge Plan and Services Expected Discharge Plan: [place] In-house Referral: Clinical Social Work Discharge Planning Services: CM Consult Post Acute Care Choice: NA Living arrangements for the past 2 months: Single Family Home                 DME Arranged: N/A (Patient to discharge to [facility] inpatient rehab) DME Agency: NA       HH Arranged: NA (n/a) [place] Agency: NA        Prior Living Arrangements/Services Living arrangements for the past 2 months: [place] with:: Self, Spouse Patient language and need for interpreter reviewed:: No Do you feel safe going back to the place where you live?: Yes      Need for Family Participation in Patient Care: Yes (Comment) Care giver support system in place?: Yes (comment) Current home services:  (none) Criminal Activity/Legal Involvement  Pertinent to Current Situation/Hospitalization: No - Comment as needed  Activities of Daily Living Home Assistive Devices/Equipment: Contact lenses, Other (Comment), Walker (specify type) (walk-in shower) ADL Screening (condition at time of admission) Patient's cognitive ability adequate to safely complete daily activities?: Yes Is the patient deaf or have difficulty hearing?: Yes ([place]) Does the patient have difficulty seeing, even when wearing glasses/contacts?: No Does the patient have difficulty concentrating, remembering, or making decisions?: No Patient able to express need for assistance with ADLs?: Yes Does the patient have difficulty dressing or bathing?: Yes (secondary to weaknes and pain) Independently performs ADLs?: No (secondary to weaknes and pain-not able to walk) Communication: Independent Dressing (OT): Dependent Is this a change from baseline?: Change from baseline, expected to last >3 days Grooming: Independent Feeding: Independent Bathing: Dependent Is this a change from baseline?: Change from baseline, expected to last >3 days Toileting: Dependent Is this a change from baseline?: Change from baseline, expected to last >3days In/Out Bed: Dependent Is this a change from baseline?: Change from baseline, expected to last >3 days Walks in Home: Dependent Is this a change from baseline?: Change from baseline, expected to last >3 days Does the patient have difficulty walking or climbing stairs?: Yes (secondary to weaknes and pain-not able to walk) Weakness of Legs: Both Weakness of Arms/Hands: None  Permission Sought/Granted Permission sought to share information with : Family Supports Permission granted to share information with : Yes, Verbal Permission Granted  Share Information with NAME: Natasha Harper  Permission granted to share info w AGENCY: SNFs  Permission granted to share info w Relationship: spouse  Permission granted to share info w Contact Information:  [phone]  Emotional Assessment Appearance:: Appears stated age Attitude/Demeanor/Rapport: Engaged Affect (typically observed): Pleasant, Appropriate Orientation: : Oriented to Self, Oriented to Place, Oriented to  Time, Oriented to Situation Alcohol / Substance Use: Not Applicable Psych Involvement: No (comment)  Admission diagnosis:  Precordial chest pain [R07.2] Elevated troponin [R77.8] Thoracic compression fracture ([place]) [S22.000A] Fall, initial encounter [W19.XXXA] Elevated alkaline phosphatase level [R74.8] Acute midline thoracic back pain [M54.6] Patient Active Problem List   Diagnosis Date Noted   Primary hypertension    Elevated troponin    Demand ischemia (HCC)    Thoracic compression fracture ([place]) 07/05/2021   Vertebral fracture, osteoporotic ([place]) 04/15/2021   Hyponatremia 04/14/2021   Hypokalemia 04/14/2021   Anemia 04/14/2021   Compression fracture of thoracic vertebra, closed, initial encounter ([place]) 04/13/2021   Sacral fracture, closed ([place]) 04/13/2021   Compression fracture of thoracic vertebra ([place]) 02/06/2021   Compression of lumbar vertebra ([place]) 02/06/2021   Back pain 02/05/2021   Medicare annual wellness visit, subsequent 12/20/2014   Decreased hearing 10/05/2013   Dyslipidemia    Carotid arterial disease ([place])    Hypothyroidism    Ejection fraction    Visit for preventive health examination 06/02/2011   Medication side effect 06/02/2011   Disorder of bone and cartilage 04/07/2008   HYPERTENSION, WHITE COAT 04/07/2008   HYPERGLYCEMIA, FASTING 07/21/2007   THYROID NODULE 04/05/2007   PCP:  Burnis Medin, MD Pharmacy:   [place], [place] - [address] [place] [address] Phone: [phone] Fax: [phone]     Social Determinants of Health (SDOH) Interventions    Readmission Risk Interventions Readmission Risk Prevention Plan 07/17/2021  Transportation Screening Complete   PCP or Specialist Appt within 3-5 Days Not Complete  Not Complete comments Plan to d/c to [facility] Inpatient rehab  [place] Not Complete  HRI or Home Care Consult comments Patient to d/c to [facility] Inpatient rehab  Social Work Consult for [place] Planning/Counseling Complete  Palliative Care Screening Not Applicable  Medication Review (RN Care Manager) Referral to Pharmacy  Some recent data might be hidden

## 2021-07-19 NOTE — TOC Progression Note (Signed)
Transition of Care [facility]) - Progression Note    Patient Details  Name: Natasha Harper MRN: [id number] Date of Birth: June 30, 1935  Transition of Care [facility]) CM/SW Contact  Beckie Busing, RN Phone Number:[phone]  07/19/2021, 11:42 AM  Clinical Narrative:    CM at bedside to discuss disposition options with patient and husband. Patient states that they are currently in the appeal process for CIR denial. Patient and husband are both agreeable to short term rehab admission at SNF if they are denied for CIR. CM provided patient and husband with medicare.gov list for facilities. Antony Blackbird LCSW has been updated on the plan. TOC will continue to follow   Expected Discharge Plan: IP Rehab Facility Barriers to Discharge: Continued Medical Work up  Expected Discharge Plan and Services Expected Discharge Plan: IP Rehab Facility In-house Referral: NA Discharge Planning Services: CM Consult Post Acute Care Choice: NA Living arrangements for the past 2 months: Single Family Home                 DME Arranged: N/A (Patient to discharge to [facility] inpatient rehab) DME Agency: NA       HH Arranged: NA (n/a) HH Agency: NA         Social Determinants of Health (SDOH) Interventions    Readmission Risk Interventions Readmission Risk Prevention Plan 07/17/2021  Transportation Screening Complete  PCP or Specialist Appt within 3-5 Days Not Complete  Not Complete comments Plan to d/c to [facility] Inpatient rehab  HRI or Home Care Consult Not Complete  HRI or Home Care Consult comments Patient to d/c to [facility] Inpatient rehab  Social Work Consult for Recovery Care Planning/Counseling Complete  Palliative Care Screening Not Applicable  Medication Review (RN Care Manager) Referral to Pharmacy  Some recent data might be hidden

## 2021-07-19 NOTE — Progress Notes (Signed)
Inpatient Rehab Admissions Coordinator:  Spoke with pt and husband at bedside. Informed them that [facility] upheld their denial after the appeal. TOC made aware. AC will sign off.   Gayland Curry, [place], [place] Admissions Coordinator [phone]

## 2021-07-20 DIAGNOSIS — W19XXXA Unspecified fall, initial encounter: Secondary | ICD-10-CM

## 2021-07-20 LAB — MAGNESIUM: Magnesium: 1.7 mg/dL (ref 1.7–2.4)

## 2021-07-20 LAB — BASIC METABOLIC PANEL
Anion gap: 3 — ABNORMAL LOW (ref 5–15)
BUN: 6 mg/dL — ABNORMAL LOW (ref 8–23)
CO2: 25 mmol/L (ref 22–32)
Calcium: 8.4 mg/dL — ABNORMAL LOW (ref 8.9–10.3)
Chloride: 104 mmol/L (ref 98–111)
Creatinine, Ser: 0.38 mg/dL — ABNORMAL LOW (ref 0.44–1.00)
GFR, Estimated: >60 mL/min (ref >60)
Glucose, Bld: 101 mg/dL — ABNORMAL HIGH (ref 70–99)
Potassium: 4.1 mmol/L (ref 3.5–5.1)
Sodium: 132 mmol/L — ABNORMAL LOW (ref 135–145)

## 2021-07-20 MED ORDER — VITAMIN C 250 MG PO TABS
125.0000 mg | ORAL_TABLET | Freq: Every day | ORAL | Status: DC
  Administered 2021-07-20 – 2021-07-24 (×5): 125 mg via ORAL
  Filled 2021-07-20 (×5): qty 1

## 2021-07-20 MED ORDER — MAGNESIUM SULFATE 2 GM/50ML IV SOLN
2.0000 g | Freq: Once | INTRAVENOUS | Status: AC
  Administered 2021-07-20: 2 g via INTRAVENOUS
  Filled 2021-07-20: qty 50

## 2021-07-20 MED ORDER — VITAMIN D 25 MCG (1000 UNIT) PO TABS
1000.0000 [IU] | ORAL_TABLET | Freq: Every day | ORAL | Status: DC
  Administered 2021-07-20 – 2021-07-24 (×5): 1000 [IU] via ORAL
  Filled 2021-07-20 (×5): qty 1

## 2021-07-20 NOTE — Progress Notes (Signed)
PROGRESS NOTE    Natasha Harper  YHC:[id number] DOB: 06/20/1935 DOA: 07/04/2021 PCP: [name], MD   Brief Narrative:  Patient is a 87 year old female with history of hypothyroidism, hyperlipidemia, coronary artery disease, hypertension, very hard of hearing who presented with back pain and falls from home.  On presentation CT of the thoracic spine showed T1 and T6 compression fracture, lab work showed elevated troponin.  Started on heparin drip on admission but then discontinued by cardiology because elevated troponin thought to be from demand ischemia.  Underwent kyphoplasty. Unfortunately IR was not able to visualize T1 well enough for kyphoplasty to be performed despite multiple attempts to reposition her. However, the procedure was successful as performed on T6. The patient has been evaluated by PT/OT. She is felt to be a candidate for CIR however insurance declined, they appealed but the decision was upheld.  Pursuing SNF now.    Assessment & Plan:   Principal Problem:   Thoracic compression fracture (HCC) Active Problems:   Elevated troponin   Demand ischemia (HCC)   Primary hypertension   Fall/thoracic vertebrae compression fracture:  -CT thoracic spine showed T1, T6 compression fracture with 10 to 20% height loss with no retropulsion or spinal canal stenosis.  Underwent kyphoplasty on 07/11/2020. Unfortunately IR was not able to visualize T1 well enough for kyphoplasty to be performed despite multiple attempts to reposition her.  - procedure was successful as performed on T6. The patient has been evaluated by PT/OT. She is felt to be a candidate for CIR however unfortunately patient was denied from insurance for CIR admission and family appealed but the decision was upheld.   -TOC aware and SNF is being pursued now- family would like [facility] or [facility]   history of coronary artery disease/elevated troponin: Troponin was elevated troponin in the range of 2K but flat  trended. She has history of coronary artery disease status post PCI to RCA and diagonal in 1993 .  Has residual 70% LAD lesion. Echo done here showed left ventricular ejection fraction of 55-60%, normal regional wall motion.  Cardiology consulted.  Elevated troponin felt to be from demand ischemia.  Patient currently denies any chest pain.  Heparin drip discontinued.  No plans for cardiac cath.      Few beats v tach -check Mg and K  Hypertension: Pt is currently normotensive on losartan and metoprolol which have been continued as he takes them at home and amlodipine which was added  as inpatient to better control her blood pressure.   Hyperlipidemia: On Lipitor   Right breast abnormal imaging: As seen on CTA: 2X 1.9 cm high-attenuation lesion in the right. She has breast lumps for years and was following with GYN, had mammograms, excisional biopsy of one of her breast was found to be benign.  She does not want further investigation.  If she wishes, outpatient mammogram/breast ultrasound recommended as outpatient to exclude neoplasm of breast   Elevated ALP: GGT elevated, ALP elevated.  Abdominal ultrasound did not show any abnormality.  Could be associated with pathology in bones with possible underlying breast cancer.  Can be monitored as an outpatient.   Macrocytic anemia: Currently hemoglobin stable in the range of 9.  Iron studies showed normal iron.  Normal vitamin B12 and folic acid   Hyponatremia: Stable.  DVT prophylaxis: heparin injection 5,000 Units Start: 07/11/21 2200   Code Status: Full Code  Family Communication: Husband present at bedside.  Plan of care discussed with patient in length and  he/she verbalized understanding and agreed with it.  Status is: Inpatient  Remains inpatient appropriate because: Awaiting bed placement at SNF     Consultants:  IR cards    Subjective: Asking if she will be working with PT today  Objective: Vitals:   07/20/21 0400 07/20/21  0459 07/20/21 0810 07/20/21 1134  BP: (!) 149/70  (!) 157/66 119/63  Pulse: 73  80 71  Resp: 19  (!) 22 (!) 22  Temp: 98.8 F (37.1 C)  97.9 F (36.6 C) 97.9 F (36.6 C)  TempSrc: Oral  Oral Oral  SpO2:   97% 95%  Weight:  47.2 kg    Height:        Intake/Output Summary (Last 24 hours) at 07/20/2021 1209 Last data filed at 07/20/2021 0309 Gross per 24 hour  Intake --  Output 1600 ml  Net -1600 ml   Filed Weights   07/18/21 0500 07/19/21 0500 07/20/21 0459  Weight: 47.2 kg 47.2 kg 47.2 kg    Examination:   General: Appearance:    Thin female in no acute distress     Lungs:     respirations unlabored  Heart:    Normal heart rate.    MS:   All extremities are intact.    Neurologic:   Awake, alert, very HOH      Data Reviewed: I have personally reviewed following labs and imaging studies  CBC: Recent Labs  Lab 07/16/21 0842  WBC 3.8*  NEUTROABS 2.4  HGB 10.3*  HCT 31.1*  MCV 102.6*  PLT 427*   Basic Metabolic Panel: Recent Labs  Lab 07/15/21 0359 07/16/21 0842  NA 130* 133*  K 3.6 3.5  CL 97* 100  CO2 26 26  GLUCOSE 90 121*  BUN 12 8  CREATININE 0.36* 0.41*  CALCIUM 8.6* 8.7*   GFR: Estimated Creatinine Clearance: 39 mL/min (A) (by C-G formula based on SCr of 0.41 mg/dL (L)). Liver Function Tests: No results for input(s): AST, ALT, ALKPHOS, BILITOT, PROT, ALBUMIN in the last 168 hours. No results for input(s): LIPASE, AMYLASE in the last 168 hours. No results for input(s): AMMONIA in the last 168 hours. Coagulation Profile: No results for input(s): INR, PROTIME in the last 168 hours. Cardiac Enzymes: No results for input(s): CKTOTAL, CKMB, CKMBINDEX, TROPONINI in the last 168 hours. BNP (last 3 results) No results for input(s): PROBNP in the last 8760 hours. HbA1C: No results for input(s): HGBA1C in the last 72 hours. CBG: No results for input(s): GLUCAP in the last 168 hours. Lipid Profile: No results for input(s): CHOL, HDL, LDLCALC,  TRIG, CHOLHDL, LDLDIRECT in the last 72 hours. Thyroid Function Tests: No results for input(s): TSH, T4TOTAL, FREET4, T3FREE, THYROIDAB in the last 72 hours. Anemia Panel: No results for input(s): VITAMINB12, FOLATE, FERRITIN, TIBC, IRON, RETICCTPCT in the last 72 hours. Sepsis Labs: No results for input(s): PROCALCITON, LATICACIDVEN in the last 168 hours.  No results found for this or any previous visit (from the past 240 hour(s)).   Radiology Studies: No results found.  Scheduled Meds:  amLODipine  10 mg Oral Daily   aspirin EC  81 mg Oral Daily   atorvastatin  80 mg Oral Daily   heparin  5,000 Units Subcutaneous Q8H   levothyroxine  50 mcg Oral Q0600   losartan  50 mg Oral Daily   mouth rinse  15 mL Mouth Rinse BID   metoprolol tartrate  12.5 mg Oral BID   polyethylene glycol  17 g Oral  Daily   sodium chloride  1 g Oral BID WC   Continuous Infusions:   LOS: 15 days   Time spent: 22 minutes  Joseph Art, MD [facility]  07/20/2021, 12:09 PM  Please page via Loretha Stapler and do not message via secure chat for anything urgent. Secure chat can be used for anything non urgent.  How to contact the [facility] Attending or Consulting provider 7A - 7P or covering provider during after hours 7P -7A, for this patient?  Check the care team in [facility] and look for a) attending/consulting TRH provider listed and b) the [facility] team listed. Page or secure chat 7A-7P. Log into [email or web address] and use [place]'s universal password to access. If you do not have the password, please contact the hospital operator. Locate the [facility] provider you are looking for under [facility] and page to a number that you can be directly reached. If you still have difficulty reaching the provider, please page the [facility] (Director on Call) for the Hospitalists listed on amion for assistance.

## 2021-07-21 NOTE — Progress Notes (Signed)
PROGRESS NOTE    Natasha Harper  MWU:[id number] DOB: Nov 08, 1934 DOA: 07/04/2021 PCP: Madelin HeadingsPanosh, [name], MD   Brief Narrative:  Patient is a [age] year old female with history of hypothyroidism, hyperlipidemia, coronary artery disease, hypertension, very hard of hearing who presented with back pain and falls from home.  On presentation CT of the thoracic spine showed T1 and T6 compression fracture, lab work showed elevated troponin.  Started on heparin drip on admission but then discontinued by cardiology because elevated troponin thought to be from demand ischemia.  Underwent kyphoplasty. Unfortunately IR was not able to visualize T1 well enough for kyphoplasty to be performed despite multiple attempts to reposition her. However, the procedure was successful as performed on T6. The patient has been evaluated by PT/OT. She is felt to be a candidate for CIR however insurance declined, they appealed but the decision was upheld.  Pursuing SNF now.    Assessment & Plan:   Principal Problem:   Thoracic compression fracture (HCC) Active Problems:   Elevated troponin   Demand ischemia (HCC)   Primary hypertension   Fall/thoracic vertebrae compression fracture:  -CT thoracic spine showed T1, T6 compression fracture with 10 to 20% height loss with no retropulsion or spinal canal stenosis.  Underwent kyphoplasty on 07/11/2020. Unfortunately IR was not able to visualize T1 well enough for kyphoplasty to be performed despite multiple attempts to reposition her.  - procedure was successful as performed on T6. The patient has been evaluated by PT/OT. She is felt to be a candidate for CIR however unfortunately patient was denied from insurance for CIR admission and family appealed but the decision was upheld.   -TOC aware and SNF is being pursued now- family would like [facility] or [facility]   history of coronary artery disease/elevated troponin: Troponin was elevated troponin in the range of 2K but flat  trended. She has history of coronary artery disease status post PCI to RCA and diagonal in 1993 .  Has residual 70% LAD lesion. Echo done here showed left ventricular ejection fraction of 55-60%, normal regional wall motion.  Cardiology consulted.  Elevated troponin felt to be from demand ischemia.  Patient currently denies any chest pain.  Heparin drip discontinued.  No plans for cardiac cath.      Few beats v tach -replace Mg  Hypertension: Pt is currently normotensive on losartan and metoprolol which have been continued as he takes them at home and amlodipine which was added  as inpatient to better control her blood pressure.   Hyperlipidemia: On Lipitor   Right breast abnormal imaging: As seen on CTA: 2X 1.9 cm high-attenuation lesion in the right. She has breast lumps for years and was following with GYN, had mammograms, excisional biopsy of one of her breast was found to be benign.  She does not want further investigation.  If she wishes, outpatient mammogram/breast ultrasound recommended as outpatient to exclude neoplasm of breast   Elevated ALP: GGT elevated, ALP elevated.  Abdominal ultrasound did not show any abnormality.  Could be associated with pathology in bones with possible underlying breast cancer.  Can be monitored as an outpatient.   Macrocytic anemia: Currently hemoglobin stable in the range of 9.  Iron studies showed normal iron.  Normal vitamin B12 and folic acid   Hyponatremia: Stable.  DVT prophylaxis: heparin injection 5,000 Units Start: 07/11/21 2200   Code Status: Full Code  Family Communication: Husband present at bedside.  1/14  Status is: Inpatient  Remains inpatient appropriate because: Awaiting  bed placement at SNF     Consultants:  IR cards    Subjective: In chair, no overnight events  Objective: Vitals:   07/20/21 2304 07/21/21 0315 07/21/21 0500 07/21/21 0813  BP: (!) 143/73 (!) 144/78  (!) 156/70  Pulse:    78  Resp: 15 14  18   Temp: 98.3  F (36.8 C) 97.9 F (36.6 C)  97.8 F (36.6 C)  TempSrc: Oral Oral  Oral  SpO2: 97% 97%    Weight:   47.2 kg   Height:        Intake/Output Summary (Last 24 hours) at 07/21/2021 1134 Last data filed at 07/21/2021 0315 Gross per 24 hour  Intake --  Output 1150 ml  Net -1150 ml   Filed Weights   07/19/21 0500 07/20/21 0459 07/21/21 0500  Weight: 47.2 kg 47.2 kg 47.2 kg    Examination:   General: Appearance:    Thin female in no acute distress- sitting in chair     Lungs:      respirations unlabored  Heart:    Normal heart rate.    MS:   All extremities are intact.    Neurologic:   Awake, alert,         Data Reviewed: I have personally reviewed following labs and imaging studies  CBC: Recent Labs  Lab 07/16/21 0842  WBC 3.8*  NEUTROABS 2.4  HGB 10.3*  HCT 31.1*  MCV 102.6*  PLT 427*   Basic Metabolic Panel: Recent Labs  Lab 07/15/21 0359 07/16/21 0842 07/20/21 1217  NA 130* 133* 132*  K 3.6 3.5 4.1  CL 97* 100 104  CO2 26 26 25   GLUCOSE 90 121* 101*  BUN 12 8 6*  CREATININE 0.36* 0.41* 0.38*  CALCIUM 8.6* 8.7* 8.4*  MG  --   --  1.7   GFR: Estimated Creatinine Clearance: 39 mL/min (A) (by C-G formula based on SCr of 0.38 mg/dL (L)). Liver Function Tests: No results for input(s): AST, ALT, ALKPHOS, BILITOT, PROT, ALBUMIN in the last 168 hours. No results for input(s): LIPASE, AMYLASE in the last 168 hours. No results for input(s): AMMONIA in the last 168 hours. Coagulation Profile: No results for input(s): INR, PROTIME in the last 168 hours. Cardiac Enzymes: No results for input(s): CKTOTAL, CKMB, CKMBINDEX, TROPONINI in the last 168 hours. BNP (last 3 results) No results for input(s): PROBNP in the last 8760 hours. HbA1C: No results for input(s): HGBA1C in the last 72 hours. CBG: No results for input(s): GLUCAP in the last 168 hours. Lipid Profile: No results for input(s): CHOL, HDL, LDLCALC, TRIG, CHOLHDL, LDLDIRECT in the last 72  hours. Thyroid Function Tests: No results for input(s): TSH, T4TOTAL, FREET4, T3FREE, THYROIDAB in the last 72 hours. Anemia Panel: No results for input(s): VITAMINB12, FOLATE, FERRITIN, TIBC, IRON, RETICCTPCT in the last 72 hours. Sepsis Labs: No results for input(s): PROCALCITON, LATICACIDVEN in the last 168 hours.  No results found for this or any previous visit (from the past 240 hour(s)).   Radiology Studies: No results found.  Scheduled Meds:  amLODipine  10 mg Oral Daily   aspirin EC  81 mg Oral Daily   atorvastatin  80 mg Oral Daily   cholecalciferol  1,000 Units Oral Daily   heparin  5,000 Units Subcutaneous Q8H   levothyroxine  50 mcg Oral Q0600   losartan  50 mg Oral Daily   mouth rinse  15 mL Mouth Rinse BID   metoprolol tartrate  12.5 mg  Oral BID   polyethylene glycol  17 g Oral Daily   sodium chloride  1 g Oral BID WC   vitamin C  125 mg Oral Daily   Continuous Infusions:   LOS: 16 days   Time spent: 22 minutes  Joseph Art, DO [facility]  07/21/2021, 11:34 AM  Please page via Amion and do not message via secure chat for anything urgent. Secure chat can be used for anything non urgent.  How to contact the [facility] Attending or Consulting provider 7A - 7P or covering provider during after hours 7P -7A, for this patient?  Check the care team in [facility] and look for a) attending/consulting TRH provider listed and b) the [facility] team listed. Page or secure chat 7A-7P. Log into [email or web address] and use [place]'s universal password to access. If you do not have the password, please contact the hospital operator. Locate the [facility] provider you are looking for under [facility] and page to a number that you can be directly reached. If you still have difficulty reaching the provider, please page the [facility] (Director on Call) for the Hospitalists listed on amion for assistance.

## 2021-07-22 NOTE — Progress Notes (Addendum)
PROGRESS NOTE    Natasha Harper  [id number] DOB: [birth year] DOA: 07/04/2021 PCP: Burnis Medin, MD   Brief Narrative:  Patient is a 87 year old female with history of hypothyroidism, hyperlipidemia, coronary artery disease, hypertension, very hard of hearing who presented with back pain and falls from home.  On presentation CT of the thoracic spine showed T1 and T6 compression fracture, lab work showed elevated troponin.  Started on heparin drip on admission but then discontinued by cardiology because elevated troponin thought to be from demand ischemia.  Underwent kyphoplasty. Unfortunately IR was not able to visualize T1 well enough for kyphoplasty to be performed despite multiple attempts to reposition her. However, the procedure was successful as performed on T6. The patient has been evaluated by PT/OT. She is felt to be a candidate for CIR however insurance declined, they appealed but the decision was upheld.  Pursuing SNF now.    Assessment & Plan:   Principal Problem:   Thoracic compression fracture (HCC) Active Problems:   Compression fracture of thoracic vertebra (HCC)   Hyponatremia   Elevated troponin   Demand ischemia (HCC)   Primary hypertension   Fall/thoracic vertebrae compression fracture:  -CT thoracic spine showed T1, T6 compression fracture with 10 to 20% height loss with no retropulsion or spinal canal stenosis.  Underwent kyphoplasty on 07/11/2020. Unfortunately IR was not able to visualize T1 well enough for kyphoplasty to be performed despite multiple attempts to reposition her.  - procedure was successful as performed on T6. The patient has been evaluated by PT/OT. She is felt to be a candidate for CIR however unfortunately patient was denied from insurance for CIR admission and family appealed but the decision was upheld.   -TOC aware and SNF is being pursued now- family would like [facility] or [facility]   history of coronary artery disease/elevated troponin:  Troponin was elevated troponin in the range of 2K but flat trended. She has history of coronary artery disease status post PCI to RCA and diagonal in 1993 .  Has residual 70% LAD lesion. Echo done here showed left ventricular ejection fraction of 55-60%, normal regional wall motion.  Cardiology consulted.  Elevated troponin felt to be from demand ischemia.  Patient currently denies any chest pain.  Heparin drip discontinued.  No plans for cardiac cath.      Few beats v tach -replace Mg  Hypertension: Pt is currently normotensive on losartan and metoprolol which have been continued as he takes them at home and amlodipine which was added  as inpatient to better control her blood pressure.   Hyperlipidemia: On Lipitor   Right breast abnormal imaging: As seen on CTA: 2X 1.9 cm high-attenuation lesion in the right. She has breast lumps for years and was following with GYN, had mammograms, excisional biopsy of one of her breast was found to be benign.  She does not want further investigation.  If she wishes, outpatient mammogram/breast ultrasound recommended as outpatient to exclude neoplasm of breast   Elevated ALP: GGT elevated, ALP elevated.  Abdominal ultrasound did not show any abnormality.  Could be associated with pathology in bones with possible underlying breast cancer.  Can be monitored as an outpatient.   Macrocytic anemia: Currently hemoglobin stable in the range of 9.  Iron studies showed normal iron.  Normal vitamin 123456 and folic acid   Hyponatremia: Stable.  DVT prophylaxis: heparin injection 5,000 Units Start: 07/11/21 2200   Code Status: Full Code  Family Communication: Husband present at bedside.  1/16  Status is: Inpatient  Remains inpatient appropriate because: Awaiting bed placement at SNF     Consultants:  IR cards    Subjective: No overnight events  Objective: Vitals:   07/22/21 0303 07/22/21 0500 07/22/21 0742 07/22/21 1140  BP: (!) 144/62  (!) 156/77 135/62   Pulse: 70  75 71  Resp: 18  (!) 24 (!) 22  Temp: 97.6 F (36.4 C)  98.6 F (37 C) 97.6 F (36.4 C)  TempSrc: Oral  Oral Oral  SpO2: 93%  96%   Weight:  47.2 kg    Height:       No intake or output data in the 24 hours ending 07/22/21 1207  Filed Weights   07/20/21 0459 07/21/21 0500 07/22/21 0500  Weight: 47.2 kg 47.2 kg 47.2 kg    Examination:   General: Appearance:    Thin female in no acute distress     Lungs:     respirations unlabored  Heart:    Normal heart rate.    MS:   All extremities are intact.    Neurologic:   Awake, alert, slightly hard of hearing          Data Reviewed: I have personally reviewed following labs and imaging studies  CBC: Recent Labs  Lab 07/16/21 0842  WBC 3.8*  NEUTROABS 2.4  HGB 10.3*  HCT 31.1*  MCV 102.6*  PLT [id number]*   Basic Metabolic Panel: Recent Labs  Lab 07/16/21 0842 07/20/21 1217  NA 133* 132*  K 3.5 4.1  CL 100 104  CO2 26 25  GLUCOSE 121* 101*  BUN 8 6*  CREATININE 0.41* 0.38*  CALCIUM 8.7* 8.4*  MG  --  1.7   GFR: Estimated Creatinine Clearance: 39 mL/min (A) (by C-G formula based on SCr of 0.38 mg/dL (L)). Liver Function Tests: No results for input(s): AST, ALT, ALKPHOS, BILITOT, PROT, ALBUMIN in the last 168 hours. No results for input(s): LIPASE, AMYLASE in the last 168 hours. No results for input(s): AMMONIA in the last 168 hours. Coagulation Profile: No results for input(s): INR, PROTIME in the last 168 hours. Cardiac Enzymes: No results for input(s): CKTOTAL, CKMB, CKMBINDEX, TROPONINI in the last 168 hours. BNP (last 3 results) No results for input(s): PROBNP in the last 8760 hours. HbA1C: No results for input(s): HGBA1C in the last 72 hours. CBG: No results for input(s): GLUCAP in the last 168 hours. Lipid Profile: No results for input(s): CHOL, HDL, LDLCALC, TRIG, CHOLHDL, LDLDIRECT in the last 72 hours. Thyroid Function Tests: No results for input(s): TSH, T4TOTAL, FREET4, T3FREE,  THYROIDAB in the last 72 hours. Anemia Panel: No results for input(s): VITAMINB12, FOLATE, FERRITIN, TIBC, IRON, RETICCTPCT in the last 72 hours. Sepsis Labs: No results for input(s): PROCALCITON, LATICACIDVEN in the last 168 hours.  No results found for this or any previous visit (from the past 240 hour(s)).   Radiology Studies: No results found.  Scheduled Meds:  amLODipine  10 mg Oral Daily   aspirin EC  81 mg Oral Daily   atorvastatin  80 mg Oral Daily   cholecalciferol  1,000 Units Oral Daily   heparin  5,000 Units Subcutaneous Q8H   levothyroxine  50 mcg Oral Q0600   losartan  50 mg Oral Daily   mouth rinse  15 mL Mouth Rinse BID   metoprolol tartrate  12.5 mg Oral BID   polyethylene glycol  17 g Oral Daily   sodium chloride  1 g Oral BID WC  vitamin C  125 mg Oral Daily   Continuous Infusions:   LOS: 17 days   Time spent: 22 minutes  Geradine Girt, DO [facility]  07/22/2021, 12:07 PM  Please page via [place] and do not message via secure chat for anything urgent. Secure chat can be used for anything non urgent.  How to contact the [facility] Attending or Consulting provider [place] or covering provider during after hours [place], for this patient?  Check the care team in [facility] and look for a) attending/consulting TRH provider listed and b) the [facility] team listed. Page or secure chat [phone]. Log into [email or web address] and use [place]'s universal password to access. If you do not have the password, please contact the hospital operator. Locate the [facility] provider you are looking for under [facility] and page to a number that you can be directly reached. If you still have difficulty reaching the provider, please page the [facility] (Director on Call) for the Hospitalists listed on amion for assistance.

## 2021-07-22 NOTE — Progress Notes (Signed)
Physical Therapy Treatment Patient Details Name: Natasha Harper MRN: [id number] DOB: 04-13-1935 Today's Date: 07/22/2021   History of Present Illness 87 yo female presents to ED on 12/29 with x2 falls, back pain sustaining T1, T6 thoracic compression fractures. Pt also with elevated troponins, NSTEMI, cards following. CTH negative for acute findings. s/p FLUOROSCOPY GUIDED T6 CORE BONE AND BALLOON KYPHOPLASTY  on 1/5. PMH includes CAD with angioplasty 1993, HTN, osteoporosis.    PT Comments    Pt with min back pain during mobility, overall functioning at close guard for safety during transfers and gait. Pt and family express continued desire for pt to d/c to SNF level of care post-acutely given pt's husband cannot physically assist if needed and has plans for surgery for hernia. PT to continue to follow acutely.     Recommendations for follow up therapy are one component of a multi-disciplinary discharge planning process, led by the attending physician.  Recommendations may be updated based on patient status, additional functional criteria and insurance authorization.  Follow Up Recommendations  Skilled nursing-short term rehab (<3 hours/day)     Assistance Recommended at Discharge Frequent or constant Supervision/Assistance  Patient can return home with the following A little help with bathing/dressing/bathroom;A little help with walking and/or transfers;Assistance with cooking/housework;Assist for transportation   Equipment Recommendations  None recommended by PT    Recommendations for Other Services       Precautions / Restrictions Precautions Precautions: Fall;Back Restrictions Weight Bearing Restrictions: No     Mobility  Bed Mobility Overal bed mobility: Needs Assistance             General bed mobility comments: up in chair    Transfers Overall transfer level: Needs assistance Equipment used: Rolling walker (2 wheels) Transfers: Sit to/from Stand Sit to  Stand: Min guard           General transfer comment: close guard for safety, cues for hand placement    Ambulation/Gait Ambulation/Gait assistance: Min guard Gait Distance (Feet): 150 Feet Assistive device: Rolling walker (2 wheels) Gait Pattern/deviations: Step-through pattern;Decreased stride length;Trunk flexed Gait velocity: decr     General Gait Details: cues for upright posture, placement in RW.   Stairs             Wheelchair Mobility    Modified Rankin (Stroke Patients Only)       Balance Overall balance assessment: Needs assistance;History of Falls Sitting-balance support: No upper extremity supported;Feet supported Sitting balance-[name]: Fair     Standing balance support: Bilateral upper extremity supported;Reliant on assistive device for balance Standing balance-[name]: Poor                              Cognition Arousal/Alertness: Awake/alert Behavior During Therapy: WFL for tasks assessed/performed Overall Cognitive Status: Within Functional Limits for tasks assessed                                 General Comments: Pt states she is "down" today, wants to d/c from acute setting        Exercises      General Comments        Pertinent Vitals/Pain Pain Assessment: Faces Faces Pain Scale: Hurts a little bit Pain Location: back, during bed mobility Pain Descriptors / Indicators: Discomfort;Grimacing Pain Intervention(s): Limited activity within patient's tolerance;Monitored during session    Home Living  Prior Function            PT Goals (current goals can now be found in the care plan section) Acute Rehab PT Goals Patient Stated Goal: independence PT Goal Formulation: With patient/family Time For Goal Achievement: 07/26/21 Potential to Achieve Goals: Good Progress towards PT goals: Progressing toward goals    Frequency    Min 3X/week      PT  Plan Current plan remains appropriate    Co-evaluation              AM-PAC PT "6 Clicks" Mobility   Outcome Measure  Help needed turning from your back to your side while in a flat bed without using bedrails?: A Little Help needed moving from lying on your back to sitting on the side of a flat bed without using bedrails?: A Little Help needed moving to and from a bed to a chair (including a wheelchair)?: A Little Help needed standing up from a chair using your arms (e.g., wheelchair or bedside chair)?: A Little Help needed to walk in hospital room?: A Little Help needed climbing 3-5 steps with a railing? : A Little 6 Click Score: 18    End of Session   Activity Tolerance: Patient tolerated treatment well;Patient limited by pain Patient left: with call bell/phone within reach;in chair;with chair alarm set Nurse Communication: Mobility status PT Visit Diagnosis: Other abnormalities of gait and mobility (R26.89);Difficulty in walking, not elsewhere classified (R26.2)     Time: 0922-0939 PT Time Calculation (min) (ACUTE ONLY): 17 min  Charges:  $Gait Training: 8-22 mins                     Stacie Glaze, PT DPT Acute Rehabilitation Services Pager [phone]  Office [phone]    [place] 07/22/2021, 10:47 AM

## 2021-07-22 NOTE — Progress Notes (Signed)
Occupational Therapy Treatment Patient Details Name: Natasha Harper MRN: [id number] DOB: Jan 08, 1935 Today's Date: 07/22/2021   History of present illness 87 yo female presents to ED on 12/29 with x2 falls, back pain sustaining T1, T6 thoracic compression fractures. Pt also with elevated troponins, NSTEMI, cards following. CTH negative for acute findings. s/p FLUOROSCOPY GUIDED T6 CORE BONE AND BALLOON KYPHOPLASTY  on 1/5. PMH includes CAD with angioplasty 1993, HTN, osteoporosis.   OT comments  Pt progressing towards established OT goals. Pt performing functional mobility in hallway with Supervision and RW; cues for upright posture. Pt performing toileting and hand hygiene at sink with Supervision. VSS on RA. Discussing dc options with family. Update dc recommend dc to SNF pending pt progress. Will continue to follow acutely as admitted.   Recommendations for follow up therapy are one component of a multi-disciplinary discharge planning process, led by the attending physician.  Recommendations may be updated based on patient status, additional functional criteria and insurance authorization.    Follow Up Recommendations  Skilled nursing-short term rehab (<3 hours/day) (Pending progress, home with [place])    Assistance Recommended at Discharge Intermittent Supervision/Assistance  Patient can return home with the following  A little help with walking and/or transfers;A little help with bathing/dressing/bathroom;Assistance with cooking/housework;Direct supervision/assist for medications management;Assist for transportation   Equipment Recommendations  BSC/3in1    Recommendations for Other Services      Precautions / Restrictions Precautions Precautions: Fall;Back Precaution Comments: back precautions and sternal bracing due to pain       Mobility Bed Mobility Overal bed mobility: Needs Assistance Bed Mobility: Rolling;Sidelying to Sit;Sit to Sidelying Rolling: Supervision Sidelying  to sit: Supervision     Sit to sidelying: Min assist General bed mobility comments: Min A for assistance with LEs over EOB    Transfers Overall transfer level: Needs assistance Equipment used: Rolling walker (2 wheels) Transfers: Sit to/from Stand Sit to Stand: Supervision           General transfer comment: Supervision for safety     Balance Overall balance assessment: Needs assistance;History of Falls Sitting-balance support: No upper extremity supported;Feet supported Sitting balance-[name]: Fair     Standing balance support: No upper extremity supported;During functional activity Standing balance-[name]: Fair Standing balance comment: Pt performing hand hygiene without UE support                           ADL either performed or assessed with clinical judgement   ADL Overall ADL's : Needs assistance/impaired     Grooming: Wash/dry hands;Supervision/safety;Standing Grooming Details (indicate cue type and reason): Supervision for safety               Lower Body Dressing Details (indicate cue type and reason): husband donning shoes for patient Toilet Transfer: Supervision/safety;Ambulation;BSC/3in1 (over toilet)   Toileting- Clothing Manipulation and Hygiene: Supervision/safety;Sitting/lateral lean       Functional mobility during ADLs: Supervision/safety;Rolling walker (2 wheels) General ADL Comments: Pt performing functional mobility in hallway and then toileting and hand hygiene. Pt performing at [employer].    Extremity/Trunk Assessment Upper Extremity Assessment Upper Extremity Assessment: Generalized weakness   Lower Extremity Assessment Lower Extremity Assessment: Defer to PT evaluation        Vision       Perception     Praxis      Cognition Arousal/Alertness: Awake/alert Behavior During Therapy: WFL for tasks assessed/performed Overall Cognitive Status: Within Functional Limits for tasks assessed  Exercises     Shoulder Instructions       General Comments Discussed  dc plan with husband    Pertinent Vitals/ Pain       Pain Assessment: Faces Faces Pain Scale: Hurts a little bit Pain Location: back, during bed mobility Pain Descriptors / Indicators: Discomfort;Grimacing Pain Intervention(s): Monitored during session;Limited activity within patient's tolerance;Repositioned  Home Living                                          Prior Functioning/Environment              Frequency  Min 2X/week        Progress Toward Goals  OT Goals(current goals can now be found in the care plan section)  Progress towards OT goals: Progressing toward goals  Acute Rehab OT Goals OT Goal Formulation: With patient/family Time For Goal Achievement: 07/26/21 Potential to Achieve Goals: Good ADL Goals Pt Will Perform Upper Body Bathing: with set-up;sitting Pt Will Perform Lower Body Bathing: with min guard assist;sit to/from stand Pt Will Transfer to Toilet: with min guard assist;bedside commode;stand pivot transfer Additional ADL Goal #1: pt will complete bed mobility supervision as precursor to adls.  Plan Discharge plan needs to be updated    Co-evaluation    PT/OT/SLP Co-Evaluation/Treatment: Yes Reason for Co-Treatment: For patient/therapist safety;To address functional/ADL transfers   OT goals addressed during session: ADL's and self-care      AM-PAC OT "6 Clicks" Daily Activity     Outcome Measure   Help from another person eating meals?: A Little Help from another person taking care of personal grooming?: A Little Help from another person toileting, which includes using toliet, bedpan, or urinal?: A Little Help from another person bathing (including washing, rinsing, drying)?: A Little Help from another person to put on and taking off regular upper body clothing?: A Little Help from another  person to put on and taking off regular lower body clothing?: A Lot 6 Click Score: 17    End of Session Equipment Utilized During Treatment: Rolling walker (2 wheels)  OT Visit Diagnosis: Unsteadiness on feet (R26.81);Muscle weakness (generalized) (M62.81)   Activity Tolerance Patient tolerated treatment well   Patient Left with call bell/phone within reach;in bed;with bed alarm set;with family/visitor present;with nursing/sitter in room   Nurse Communication Mobility status;Precautions        Time: [id number] OT Time Calculation (min): 29 min  Charges: OT General Charges $OT Visit: 1 Visit OT Treatments $Self Care/Home Management : 8-22 mins $Therapeutic Activity: 8-22 mins  [place], OTR/L Acute Rehab Pager: [phone] Office: [place] 07/22/2021, 5:06 PM

## 2021-07-22 NOTE — TOC Progression Note (Signed)
Transition of Care [facility]) - Progression Note    Patient Details  Name: Natasha Harper MRN: [id number] Date of Birth: 13-Jun-1935  Transition of Care [facility]) CM/SW [place], [place] Phone Number: 07/22/2021, 4:00 PM  Clinical Narrative:     CSW met with pt and spouse at bedside. They chose [facility] for rehab. They were advised that insurance has been started, but pt may not get approved due to her doing much better. They were advised to think about the plan if pt is denied, that pt would need to return home. They noted understanding and CSW started [place]. TOC will need to follow up with facility when auth is approved, will continue to follow.  Expected Discharge Plan: Skilled Nursing Facility Barriers to Discharge: Ship broker, Continued Medical Work up, SNF Pending bed offer  Expected Discharge Plan and Services Expected Discharge Plan: [place] In-house Referral: Clinical Social Work Discharge Planning Services: CM Consult Post Acute Care Choice: NA Living arrangements for the past 2 months: Single Family Home                 DME Arranged: N/A (Patient to discharge to [facility] inpatient rehab) DME Agency: NA       HH Arranged: NA (n/a) [place] Agency: NA         Social Determinants of Health (SDOH) Interventions    Readmission Risk Interventions Readmission Risk Prevention Plan 07/17/2021  Transportation Screening Complete  PCP or Specialist Appt within 3-5 Days Not Complete  Not Complete comments Plan to d/c to [facility] Inpatient rehab  [place] or [place] Not Complete  HRI or Home Care Consult comments Patient to d/c to [facility] Inpatient rehab  Social Work Consult for [place] Planning/Counseling Complete  Palliative Care Screening Not Applicable  Medication Review (RN Care Manager) Referral to Pharmacy  Some recent data might be hidden

## 2021-07-23 NOTE — Progress Notes (Signed)
PROGRESS NOTE    Natasha Harper  [id number] DOB: April 06, 1935 DOA: 07/04/2021 PCP: Burnis Medin, MD   Brief Narrative:  Patient is a 87 year old female with history of hypothyroidism, hyperlipidemia, coronary artery disease, hypertension, very hard of hearing who presented with back pain and falls from home.  On presentation CT of the thoracic spine showed T1 and T6 compression fracture, lab work showed elevated troponin.  Started on heparin drip on admission but then discontinued by cardiology because elevated troponin thought to be from demand ischemia.  Underwent kyphoplasty. Unfortunately IR was not able to visualize T1 well enough for kyphoplasty to be performed despite multiple attempts to reposition her. However, the procedure was successful as performed on T6. The patient has been evaluated by PT/OT. She is felt to be a candidate for CIR however insurance declined, they appealed but the decision was upheld.  Pursuing SNF now.    Assessment & Plan:   Principal Problem:   Thoracic compression fracture (HCC) Active Problems:   Compression fracture of thoracic vertebra (HCC)   Hyponatremia   Elevated troponin   Demand ischemia (HCC)   Primary hypertension   Fall/thoracic vertebrae compression fracture:  -CT thoracic spine showed T1, T6 compression fracture with 10 to 20% height loss with no retropulsion or spinal canal stenosis.  Underwent kyphoplasty on 07/11/2020. Unfortunately IR was not able to visualize T1 well enough for kyphoplasty to be performed despite multiple attempts to reposition her.  - procedure was successful as performed on T6. The patient has been evaluated by PT/OT. She is felt to be a candidate for CIR however unfortunately patient was denied from insurance for CIR admission and family appealed but the decision was upheld.   -TOC aware and SNF is being pursued now- family would like [facility]   history of coronary artery disease/elevated troponin: Troponin was  elevated troponin in the range of 2K but flat trended. She has history of coronary artery disease status post PCI to RCA and diagonal in 1993 .  Has residual 70% LAD lesion. Echo done here showed left ventricular ejection fraction of 55-60%, normal regional wall motion.  Cardiology consulted.  Elevated troponin felt to be from demand ischemia.  Patient currently denies any chest pain.  Heparin drip discontinued.  No plans for cardiac cath.      Few beats v tach -replaced Mg  Hypertension: Pt is currently normotensive on losartan and metoprolol which have been continued as he takes them at home and amlodipine which was added  as inpatient to better control her blood pressure.   Hyperlipidemia: On Lipitor   Right breast abnormal imaging: As seen on CTA: 2X 1.9 cm high-attenuation lesion in the right. She has breast lumps for years and was following with GYN, had mammograms, excisional biopsy of one of her breast was found to be benign.  She does not want further investigation.  If she wishes, outpatient mammogram/breast ultrasound recommended as outpatient to exclude neoplasm of breast   Elevated ALP: GGT elevated, ALP elevated.  Abdominal ultrasound did not show any abnormality.  Could be associated with pathology in bones with possible underlying breast cancer.  Can be monitored as an outpatient.   Macrocytic anemia: Currently hemoglobin stable in the range of 9.  Iron studies showed normal iron.  Normal vitamin 123456 and folic acid   Hyponatremia: Stable.  DVT prophylaxis: heparin injection 5,000 Units Start: 07/11/21 2200   Code Status: Full Code  Family Communication: Husband present at bedside.  1/17  Status is: Inpatient  Remains inpatient appropriate because: Awaiting bed placement at SNF     Consultants:  IR cards    Subjective: No overnight events, motivated to get better  Objective: Vitals:   07/22/21 2303 07/23/21 0310 07/23/21 0500 07/23/21 0700  BP: 139/69 (!) 158/73   (!) 160/54  Pulse: 69     Resp: 16     Temp: 98.1 F (36.7 C) 98.4 F (36.9 C)  98.5 F (36.9 C)  TempSrc: Oral Oral  Oral  SpO2: 93%     Weight:   47.2 kg   Height:        Intake/Output Summary (Last 24 hours) at 07/23/2021 1250 Last data filed at 07/23/2021 0615 Gross per 24 hour  Intake 195 ml  Output 1000 ml  Net -805 ml    Filed Weights   07/21/21 0500 07/22/21 0500 07/23/21 0500  Weight: 47.2 kg 47.2 kg 47.2 kg    Examination:  General: Appearance:    Thin female in no acute distress     Lungs:     respirations unlabored  Heart:    Normal heart rate.      Data Reviewed: I have personally reviewed following labs and imaging studies  CBC: No results for input(s): WBC, NEUTROABS, HGB, HCT, MCV, PLT in the last 168 hours.  Basic Metabolic Panel: Recent Labs  Lab 07/20/21 1217  NA 132*  K 4.1  CL 104  CO2 25  GLUCOSE 101*  BUN 6*  CREATININE 0.38*  CALCIUM 8.4*  MG 1.7   GFR: Estimated Creatinine Clearance: 39 mL/min (A) (by C-G formula based on SCr of 0.38 mg/dL (L)). Liver Function Tests: No results for input(s): AST, ALT, ALKPHOS, BILITOT, PROT, ALBUMIN in the last 168 hours. No results for input(s): LIPASE, AMYLASE in the last 168 hours. No results for input(s): AMMONIA in the last 168 hours. Coagulation Profile: No results for input(s): INR, PROTIME in the last 168 hours. Cardiac Enzymes: No results for input(s): CKTOTAL, CKMB, CKMBINDEX, TROPONINI in the last 168 hours. BNP (last 3 results) No results for input(s): PROBNP in the last 8760 hours. HbA1C: No results for input(s): HGBA1C in the last 72 hours. CBG: No results for input(s): GLUCAP in the last 168 hours. Lipid Profile: No results for input(s): CHOL, HDL, LDLCALC, TRIG, CHOLHDL, LDLDIRECT in the last 72 hours. Thyroid Function Tests: No results for input(s): TSH, T4TOTAL, FREET4, T3FREE, THYROIDAB in the last 72 hours. Anemia Panel: No results for input(s): VITAMINB12, FOLATE,  FERRITIN, TIBC, IRON, RETICCTPCT in the last 72 hours. Sepsis Labs: No results for input(s): PROCALCITON, LATICACIDVEN in the last 168 hours.  No results found for this or any previous visit (from the past 240 hour(s)).   Radiology Studies: No results found.  Scheduled Meds:  amLODipine  10 mg Oral Daily   aspirin EC  81 mg Oral Daily   atorvastatin  80 mg Oral Daily   cholecalciferol  1,000 Units Oral Daily   heparin  5,000 Units Subcutaneous Q8H   levothyroxine  50 mcg Oral Q0600   losartan  50 mg Oral Daily   mouth rinse  15 mL Mouth Rinse BID   metoprolol tartrate  12.5 mg Oral BID   polyethylene glycol  17 g Oral Daily   sodium chloride  1 g Oral BID WC   vitamin C  125 mg Oral Daily   Continuous Infusions:   LOS: 18 days   Time spent: 22 minutes  Geradine Girt, DO  [facility]  07/23/2021, 12:50 PM  Please page via [place] and do not message via secure chat for anything urgent. Secure chat can be used for anything non urgent.  How to contact the [facility] Attending or Consulting provider [place] or covering provider during after hours [place], for this patient?  Check the care team in [facility] and look for a) attending/consulting TRH provider listed and b) the [facility] team listed. Page or secure chat [phone]. Log into [email or web address] and use [place]'s universal password to access. If you do not have the password, please contact the hospital operator. Locate the [facility] provider you are looking for under [facility] and page to a number that you can be directly reached. If you still have difficulty reaching the provider, please page the [facility] (Director on Call) for the Hospitalists listed on amion for assistance.

## 2021-07-23 NOTE — TOC Progression Note (Signed)
Transition of Care [facility]) - Progression Note    Patient Details  Name: Natasha Harper MRN: [id number] Date of Birth: 1935/03/05  [facility] of [facility]) CM/SW Contact  Jimmy Picket, [place] Phone Number: 07/23/2021, 4:13 PM  Clinical Narrative:     CSW was informed by [facility] that insurance Berkley Harvey is still pending.   Expected Discharge Plan: Skilled Nursing Facility Barriers to Discharge: English as a second language teacher, Continued Medical Work up, SNF Pending bed offer  Expected Discharge Plan and Services Expected Discharge Plan: Skilled Nursing Facility In-house Referral: Clinical Social Work Discharge Planning Services: CM Consult Post Acute Care Choice: NA Living arrangements for the past 2 months: Single Family Home                 DME Arranged: N/A (Patient to discharge to [facility] inpatient rehab) DME Agency: NA       HH Arranged: NA (n/a) HH Agency: NA         Social Determinants of Health (SDOH) Interventions    Readmission Risk Interventions Readmission Risk Prevention Plan 07/17/2021  Transportation Screening Complete  PCP or Specialist Appt within 3-5 Days Not Complete  Not Complete comments Plan to d/c to [facility] Inpatient rehab  HRI or Home Care Consult Not Complete  HRI or Home Care Consult comments Patient to d/c to [facility] Inpatient rehab  Social Work Consult for Recovery Care Planning/Counseling Complete  Palliative Care Screening Not Applicable  Medication Review (RN Care Manager) Referral to Pharmacy  Some recent data might be hidden   Jimmy Picket, LCSW Clinical Social Worker

## 2021-07-23 NOTE — TOC Progression Note (Signed)
Transition of Care [facility]) - Progression Note    Patient Details  Name: Natasha Harper MRN: [id number] Date of Birth: 03/17/1935  Transition of Care [facility]) CM/SW [place], RN Phone Number:[phone]  07/23/2021, 4:20 PM  Clinical Narrative:    Cm at bedside to make patient and husband aware that insurance Josem Kaufmann is still pending. CM made patient and wife aware that the next alternative would be home with home health. Patient was previously set up with [facility] and would be ok to resume services. Sw has been made aware. CM will follow up with SW in the am.    Expected Discharge Plan: [place] Barriers to Discharge: Ship broker, Continued Medical Work up, SNF Pending bed offer  Expected Discharge Plan and Services Expected Discharge Plan: [place] In-house Referral: Clinical Social Work Discharge Planning Services: CM Consult Post Acute Care Choice: NA Living arrangements for the past 2 months: Single Family Home                 DME Arranged: N/A (Patient to discharge to [facility] inpatient rehab) DME Agency: NA       HH Arranged: NA (n/a) [place] Agency: NA         Social Determinants of Health (SDOH) Interventions    Readmission Risk Interventions Readmission Risk Prevention Plan 07/17/2021  Transportation Screening Complete  PCP or Specialist Appt within 3-5 Days Not Complete  Not Complete comments Plan to d/c to [facility] Inpatient rehab  [place] or [place] Not Complete  HRI or Home Care Consult comments Patient to d/c to [facility] Inpatient rehab  Social Work Consult for [place] Planning/Counseling Complete  Palliative Care Screening Not Applicable  Medication Review (RN Care Manager) Referral to Pharmacy  Some recent data might be hidden

## 2021-07-24 DIAGNOSIS — R748 Abnormal levels of other serum enzymes: Secondary | ICD-10-CM

## 2021-07-24 MED ORDER — POLYETHYLENE GLYCOL 3350 17 G PO PACK: 17.0000 g | PACK | Freq: Every day | ORAL | 3 refills | Status: DC | PRN

## 2021-07-24 MED ORDER — LOSARTAN POTASSIUM 50 MG PO TABS: 50.0000 mg | ORAL_TABLET | Freq: Every day | ORAL | 0 refills | Status: DC

## 2021-07-24 MED ORDER — TRAMADOL HCL 50 MG PO TABS: 50.0000 mg | ORAL_TABLET | Freq: Four times a day (QID) | ORAL | 0 refills | Status: DC | PRN

## 2021-07-24 MED ORDER — AMLODIPINE BESYLATE 5 MG PO TABS: 5.0000 mg | ORAL_TABLET | Freq: Every day | ORAL | 3 refills | Status: DC

## 2021-07-24 NOTE — TOC Transition Note (Signed)
Transition of Care [facility]) - CM/SW Discharge Note   Patient Details  Name: Natasha Harper MRN: [id number] Date of Birth: 1934/11/14  Transition of Care [facility]) CM/SW Contact:  Beckie Busing, RN Phone [phone]  07/24/2021, 11:51 AM   Clinical Narrative:    Patient to discharge home with Home health services. Husband and patient state that they were set up with [facility] for home health prior to this hospitalization and were never able to start. They would like to resume services with [facility]. Referral has been called and accepted with Barbara Cower at [place].  Info has been added to avs. There are currently no other needs at this time. TOC will sign off.     Final next level of care: Home w Home Health Services Barriers to Discharge: No Barriers Identified   Patient Goals and CMS Choice Patient states their goals for this hospitalization and ongoing recovery are:: Ready to go home CMS Medicare.gov Compare Post Acute Care list provided to:: Patient Represenative (must comment) (spouse) Choice offered to / list presented to : Spouse  Discharge Placement                  Name of family member notified: Husband at bedside made aware of discharge home    Discharge Plan and Services In-house Referral: Clinical Social Work Discharge Planning Services: CM Consult Post Acute Care Choice: NA          DME Arranged: N/A DME Agency: NA       HH Arranged: PT, OT HH Agency: [facility] Date HH Agency Contacted: 07/24/21 Time HH Agency Contacted: 1147 Representative spoke with at [facility] Agency: Barbara Cower  Social Determinants of Health (SDOH) Interventions     Readmission Risk Interventions Readmission Risk Prevention Plan 07/17/2021  Transportation Screening Complete  PCP or Specialist Appt within 3-5 Days Not Complete  Not Complete comments Plan to d/c to [facility] Inpatient rehab  HRI or Home Care Consult Not Complete  HRI or Home Care Consult comments Patient  to d/c to [facility] Inpatient rehab  Social Work Consult for Recovery Care Planning/Counseling Complete  Palliative Care Screening Not Applicable  Medication Review (RN Care Manager) Referral to Pharmacy  Some recent data might be hidden

## 2021-07-24 NOTE — Care Management Important Message (Signed)
Important Message  Patient Details  Name: Natasha Harper MRN: [id number] Date of Birth: [birth year]   Medicare Important Message Given:  Yes     Sherilyn Banker 07/24/2021, 10:40 AM

## 2021-07-24 NOTE — TOC Progression Note (Signed)
Transition of Care [facility]) - Progression Note    Patient Details  Name: Natasha Harper MRN: [id number] Date of Birth: [birth year]  Transition of Care [facility]) CM/SW Contact  Beckie Busing, RN Phone [phone]  07/24/2021, 8:43 AM  Clinical Narrative:    Patients wife found Cm on unit and states that he and his wife have decided to go home with home health. They are tired of waiting in the hospital. CM has notified MD. Husband states that patient opreviously had home health set up with [facility] and would like to resume services with them. CM has made [facility] referral with Barbara Cower with [facility]. Will await decision on discharge per MD.    Expected Discharge Plan: Skilled Nursing Facility Barriers to Discharge: Insurance Authorization, Continued Medical Work up, SNF Pending bed offer  Expected Discharge Plan and Services Expected Discharge Plan: Skilled Nursing Facility In-house Referral: Clinical Social Work Discharge Planning Services: CM Consult Post Acute Care Choice: NA Living arrangements for the past 2 months: Single Family Home                 DME Arranged: N/A (Patient to discharge to [facility] inpatient rehab) DME Agency: NA       HH Arranged: NA (n/a) HH Agency: NA         Social Determinants of Health (SDOH) Interventions    Readmission Risk Interventions Readmission Risk Prevention Plan 07/17/2021  Transportation Screening Complete  PCP or Specialist Appt within 3-5 Days Not Complete  Not Complete comments Plan to d/c to [facility] Inpatient rehab  HRI or Home Care Consult Not Complete  HRI or Home Care Consult comments Patient to d/c to [facility] Inpatient rehab  Social Work Consult for Recovery Care Planning/Counseling Complete  Palliative Care Screening Not Applicable  Medication Review (RN Care Manager) Referral to Pharmacy  Some recent data might be hidden

## 2021-07-24 NOTE — Discharge Summary (Signed)
Physician Discharge Summary  Natasha Harper [id number] DOB: Nov 18, 1934 DOA: 07/04/2021  PCP: Burnis Medin, MD  Admit date: 07/04/2021 Discharge date: 07/24/2021  Admitted From: Home Disposition: Home with home health  Recommendations for Outpatient Follow-up:  Hypertension  Discharge Condition: Stable CODE STATUS: Full code Diet recommendation: Heart healthy Consultations: IR Cardiology Procedures/Studies: Kyphoplasty   Discharge Diagnoses:  Principal Problem:   Thoracic compression fracture ([place]) Active Problems:   Elevated troponin   Demand ischemia ([place])   Primary hypertension     Brief Summary: Patient is a 87 year old female with history of hypothyroidism, hyperlipidemia, coronary artery disease, hypertension, very hard of hearing who presented with back pain and falls from home.  On presentation CT of the thoracic spine showed T1 and T6 compression fracture, lab work showed elevated troponin.  Started on heparin drip on admission but then discontinued by cardiology because elevated troponin thought to be from demand ischemia.  Underwent kyphoplasty. Unfortunately IR was not able to visualize T1 well enough for kyphoplasty to be performed despite multiple attempts to reposition her. However, the procedure was successful as performed on T6. The patient has been evaluated by PT/OT. She is felt to be a candidate for CIR however insurance declined, they appealed but the decision was upheld.  We have been pursuing SNF but they are not willing to wait longer and would like to go home.  I am seeing this patient today and have been told by the patient and her husband and our case manager that the plan is to discharge home.  Hospital Course:  Fall with vertebral compression fracture - CT of the thoracic spine revealed T1 and T6 compression fractures-see imaging below - Kyphoplasty was planned for 07/11/2020 however IR was not able to visualize T1 enough for a  kyphoplasty-they were able to perform a kyphoplasty on T6 - She was evaluated by PT OT initially felt to be a candidate for CIR but subsequently denied by insurance-subsequently felt to be a candidate for SNF however due to waiting for multiple days for insurance Auth, the patient and family have decided to go home  Elevated troponin with a history of coronary artery disease - Troponin rose to about 2000 - Cardiology was consulted and felt that this was secondary to demand ischemia she was on a heparin drip  fro 48 hrs  - No further interventions planned  Occasional V. tach - Mag was low and replaced  Right breast abnormal imaging - High attenuation lesion in the right breast noticed on CTA which is documented below - The patient stated that she did not want further investigation  Hypertension - Losartan and Amlodipine added as inpt  Elevated alkaline phosphatase - Abdominal ultrasound was done in the hospital-see imaging below  Discharge Exam: Vitals:   07/24/21 0355 07/24/21 0733  BP: (!) 143/89 (!) 157/65  Pulse: 70 73  Resp: 16 20  Temp: 98.7 F (37.1 C) 99.1 F (37.3 C)  SpO2: 96% 96%   Vitals:   07/23/21 2356 07/24/21 0355 07/24/21 0500 07/24/21 0733  BP: (!) 150/98 (!) 143/89  (!) 157/65  Pulse:  70  73  Resp:  16  20  Temp: 98.6 F (37 C) 98.7 F (37.1 C)  99.1 F (37.3 C)  TempSrc: Oral Oral  Oral  SpO2:  96%  96%  Weight:   47.2 kg   Height:        General: Pt is alert, awake, not in acute distress Cardiovascular: RRR, S1/S2 +, no  rubs, no gallops Respiratory: CTA bilaterally, no wheezing, no rhonchi Abdominal: Soft, NT, ND, bowel sounds + Extremities: no edema, no cyanosis   Discharge Instructions  Discharge Instructions     Diet - low sodium heart healthy   Complete by: As directed    Increase activity slowly   Complete by: As directed    No wound care   Complete by: As directed       Allergies as of 07/24/2021       Reactions   Actonel  [risedronate Sodium]    bloating   Evista [raloxifene]    Abdominal cramps   Fosamax [alendronate Sodium]    Abdominal cramps        Medication List     TAKE these medications    acetaminophen 500 MG tablet Commonly known as: TYLENOL Take 1,000 mg by mouth every 6 (six) hours as needed for moderate pain.   amLODipine 5 MG tablet Commonly known as: NORVASC Take 1 tablet (5 mg total) by mouth daily.   aspirin 81 MG tablet Take 81 mg by mouth daily.   atorvastatin 80 MG tablet Commonly known as: LIPITOR TAKE 1 TABLET BY MOUTH DAILY   baclofen 10 MG tablet Commonly known as: LIORESAL Take 0.5-1 tablets (5-10 mg total) by mouth 3 (three) times daily as needed for muscle spasms.   cholecalciferol 25 MCG (1000 UNIT) tablet Commonly known as: VITAMIN D3 Take 1,000 Units by mouth daily.   fish oil-omega-3 fatty acids 1000 MG capsule Take 1,000 mg by mouth daily.   folic acid 1 MG tablet Commonly known as: FOLVITE Take 1 tablet (1 mg total) by mouth daily.   lidocaine 5 % Commonly known as: LIDODERM Place 1 patch onto the skin daily. Remove & Discard patch within 12 hours or as directed by MD What changed:  when to take this reasons to take this   losartan 50 MG tablet Commonly known as: COZAAR Take 1 tablet (50 mg total) by mouth daily. Start taking on: July 25, 2021   MULTIPLE VITAMIN PO Take 1 tablet by mouth daily.   polyethylene glycol 17 g packet Commonly known as: MIRALAX / GLYCOLAX Take 17 g by mouth daily as needed for mild constipation. Can take up to twice a day for constipation.   Synthroid 50 MCG tablet Generic drug: levothyroxine TAKE 1 TABLET BY MOUTH DAILY What changed:  how much to take when to take this   traMADol 50 MG tablet Commonly known as: ULTRAM Take 1 tablet (50 mg total) by mouth every 6 (six) hours as needed for moderate pain.   vitamin C 100 MG tablet Take 100 mg by mouth daily.   VITAMIN K2 PO Take 1 tablet by mouth  daily.        Allergies  Allergen Reactions   Actonel [Risedronate Sodium]     bloating   Evista [Raloxifene]     Abdominal cramps   Fosamax [Alendronate Sodium]     Abdominal cramps      DG Chest 2 View  Result Date: 07/05/2021 CLINICAL DATA:  Chest pain.  Fall. EXAM: CHEST - 2 VIEW COMPARISON:  None. FINDINGS: Left lung base density, likely atelectasis or infiltrate. Trace left pleural effusion may be present. No pneumothorax. There is cardiomegaly with vascular congestion. Atherosclerotic calcification of the aorta. Osteopenia with degenerative changes of the spine. Multilevel age indeterminate compression fractures. IMPRESSION: 1. Left lung base atelectasis versus infiltrate. 2. Cardiomegaly with vascular congestion. 3. Multilevel age indeterminate compression fractures.  Electronically Signed   By: Anner Crete M.D.   On: 07/05/2021 00:40   DG Thoracic Spine 2 View  Result Date: 07/05/2021 CLINICAL DATA:  Low back pain after 2 falls today. Limited movement. EXAM: THORACIC SPINE 2 VIEWS COMPARISON:  CT thoracic spine 04/13/2021 FINDINGS: Normal alignment of the thoracic spine. Diffuse bone demineralization. Multiple vertebral compression fractures demonstrated at T12, T11, T10, T9, T8, T7, and T6 levels. Mild endplate compression at T5 and T4. Similar appearance to previous study. No definite new fractures. No abnormal paraspinal soft tissue swelling. Due to bone demineralization, CT would be more sensitive for evaluation of possible progression if clinically indicated. IMPRESSION: Diffuse bone demineralization with multiple thoracic vertebral compression fractures, similar to prior study. Changes likely represent osteoporosis. Electronically Signed   By: Lucienne Capers M.D.   On: 07/05/2021 00:00   DG Lumbar Spine Complete  Result Date: 07/04/2021 CLINICAL DATA:  Back pain.  Two falls today. EXAM: LUMBAR SPINE - COMPLETE 4+ VIEW COMPARISON:  CT 04/13/2021 FINDINGS: Diffuse  bone demineralization. Five lumbar type vertebral bodies. Compression fractures again demonstrated at L1 without change. Mild superior endplate compression demonstrated at L2, L4, and L5, similar to prior study. Alignment is unchanged. Overlying bowel gas limits visualization of the sacrum. Prominent vascular calcifications. IMPRESSION: Diffuse bone demineralization with multiple compression fractures, similar to prior study. Changes likely due to osteoporosis. No definite acute fractures. Electronically Signed   By: Lucienne Capers M.D.   On: 07/04/2021 23:58   CT Head Wo Contrast  Result Date: 07/05/2021 CLINICAL DATA:  Multiple falls. EXAM: CT HEAD WITHOUT CONTRAST TECHNIQUE: Contiguous axial images were obtained from the base of the skull through the vertex without intravenous contrast. COMPARISON:  None. FINDINGS: Brain: There is moderate severity cerebral atrophy with widening of the extra-axial spaces and ventricular dilatation. There are areas of decreased attenuation within the white matter tracts of the supratentorial brain, consistent with microvascular disease changes. Vascular: No hyperdense vessel or unexpected calcification. Skull: Negative for acute fracture. A 9 mm x 4 mm benign-appearing area of focal cortical thickening is seen within left frontal region. Sinuses/Orbits: No acute finding. Other: None. IMPRESSION: 1. Generalized cerebral atrophy and microvascular disease changes of the supratentorial brain. 2. No acute intracranial abnormality. Electronically Signed   By: Virgina Norfolk M.D.   On: 07/05/2021 02:30   CT Angio Chest PE W and/or Wo Contrast  Result Date: 07/05/2021 CLINICAL DATA:  87 year old female with clinical high suspicion of pulmonary embolism. EXAM: CT ANGIOGRAPHY CHEST WITH CONTRAST TECHNIQUE: Multidetector CT imaging of the chest was performed using the standard protocol during bolus administration of intravenous contrast. Multiplanar CT image reconstructions  and MIPs were obtained to evaluate the vascular anatomy. CONTRAST:  48mL OMNIPAQUE IOHEXOL 350 MG/ML SOLN COMPARISON:  No priors. FINDINGS: Cardiovascular: No filling defects are noted in the pulmonary arterial tree to suggest pulmonary embolism. Heart size is normal. There is no significant pericardial fluid, thickening or pericardial calcification. There is aortic atherosclerosis, as well as atherosclerosis of the great vessels of the mediastinum and the coronary arteries, including calcified atherosclerotic plaque in the left main, left anterior descending and right coronary arteries. Mediastinum/Nodes: No pathologically enlarged mediastinal or hilar lymph nodes. Hilar esophagus is unremarkable in appearance. No axillary lymphadenopathy. Lungs/Pleura: No acute consolidative airspace disease. No pleural effusions. Dependent areas of subsegmental atelectasis lying in the lower lobes of the lungs bilaterally. No definite suspicious appearing pulmonary nodules or masses are noted. Upper Abdomen: Aortic atherosclerosis. Musculoskeletal: In the  right breast (axial image 88 of series 4) there is a 2.0 x 1.9 cm high attenuation (83 HU) lesion. Multiple chronic appearing compression fractures are noted in the thoracic spine involving the superior endplate of T1, T5, superior endplate of T6, T7, T8, T9, T10 and T11, most severe at T11 where there is 70% loss of anterior vertebral body height. There are no aggressive appearing lytic or blastic lesions noted in the visualized portions of the skeleton. Review of the MIP images confirms the above findings. IMPRESSION: 1. No evidence of pulmonary embolism. 2. Widespread dependent atelectasis in the lower lobes of the lungs bilaterally. 3. Aortic atherosclerosis, in addition to left main and 2 vessel coronary artery disease. 4. 2.0 x 1.9 cm high attenuation lesion in the right breast. This is nonspecific, but warrants further evaluation with follow-up mammography and/or breast  ultrasound in the near future to exclude neoplasm. Aortic Atherosclerosis (ICD10-I70.0). Electronically Signed   By: Vinnie Langton M.D.   On: 07/05/2021 07:01   CT Cervical Spine Wo Contrast  Result Date: 07/05/2021 CLINICAL DATA:  Trauma EXAM: CT CERVICAL, THORACIC, AND LUMBAR SPINE WITHOUT CONTRAST TECHNIQUE: Multidetector CT imaging of the cervical, thoracic and lumbar spine was performed without intravenous contrast. Multiplanar CT image reconstructions were also generated. COMPARISON:  04/13/2021 FINDINGS: CT CERVICAL SPINE FINDINGS Alignment: Normal. Skull base and vertebrae: No acute fracture. No primary bone lesion or focal pathologic process. Soft tissues and spinal canal: No prevertebral fluid or swelling. No visible canal hematoma. Disc levels:  No spinal canal stenosis. CT THORACIC SPINE FINDINGS Alignment: Normal. Vertebrae: There is a wedge compression fracture of T1 with less than 20% loss, involving the anterior wall and superior endplate. No retropulsion. There is also a fracture at T6 with approximately 25% height loss that is new since the prior study. The fracture involves the anterior and posterior walls and the superior endplate. Lower thoracic compression deformities are unchanged. Paraspinal and other soft tissues: Calcific aortic atherosclerosis Disc levels: No spinal canal stenosis CT LUMBAR SPINE FINDINGS Segmentation: 5 lumbar type vertebrae. Alignment: Grade 1 anterolisthesis at L4-5 Vertebrae: Chronic compression deformity of L1. Paraspinal and other soft tissues: Calcific aortic atherosclerosis. Disc levels: No spinal canal stenosis. IMPRESSION: 1. T1 and T6 compression fractures with 10-20% height loss, new since the prior study. No retropulsion or spinal canal stenosis. 2. Multiple other chronic compression deformities of the thoracic and lumbar spine. 3. No acute fracture or static subluxation of the cervical spine. Aortic Atherosclerosis (ICD10-I70.0). Electronically Signed    By: Ulyses Jarred M.D.   On: 07/05/2021 01:16   CT Thoracic Spine Wo Contrast  Result Date: 07/05/2021 CLINICAL DATA:  Trauma EXAM: CT CERVICAL, THORACIC, AND LUMBAR SPINE WITHOUT CONTRAST TECHNIQUE: Multidetector CT imaging of the cervical, thoracic and lumbar spine was performed without intravenous contrast. Multiplanar CT image reconstructions were also generated. COMPARISON:  04/13/2021 FINDINGS: CT CERVICAL SPINE FINDINGS Alignment: Normal. Skull base and vertebrae: No acute fracture. No primary bone lesion or focal pathologic process. Soft tissues and spinal canal: No prevertebral fluid or swelling. No visible canal hematoma. Disc levels:  No spinal canal stenosis. CT THORACIC SPINE FINDINGS Alignment: Normal. Vertebrae: There is a wedge compression fracture of T1 with less than 20% loss, involving the anterior wall and superior endplate. No retropulsion. There is also a fracture at T6 with approximately 25% height loss that is new since the prior study. The fracture involves the anterior and posterior walls and the superior endplate. Lower thoracic compression deformities are  unchanged. Paraspinal and other soft tissues: Calcific aortic atherosclerosis Disc levels: No spinal canal stenosis CT LUMBAR SPINE FINDINGS Segmentation: 5 lumbar type vertebrae. Alignment: Grade 1 anterolisthesis at L4-5 Vertebrae: Chronic compression deformity of L1. Paraspinal and other soft tissues: Calcific aortic atherosclerosis. Disc levels: No spinal canal stenosis. IMPRESSION: 1. T1 and T6 compression fractures with 10-20% height loss, new since the prior study. No retropulsion or spinal canal stenosis. 2. Multiple other chronic compression deformities of the thoracic and lumbar spine. 3. No acute fracture or static subluxation of the cervical spine. Aortic Atherosclerosis (ICD10-I70.0). Electronically Signed   By: Ulyses Jarred M.D.   On: 07/05/2021 01:16   CT Lumbar Spine Wo Contrast  Result Date:  07/05/2021 CLINICAL DATA:  Trauma EXAM: CT CERVICAL, THORACIC, AND LUMBAR SPINE WITHOUT CONTRAST TECHNIQUE: Multidetector CT imaging of the cervical, thoracic and lumbar spine was performed without intravenous contrast. Multiplanar CT image reconstructions were also generated. COMPARISON:  04/13/2021 FINDINGS: CT CERVICAL SPINE FINDINGS Alignment: Normal. Skull base and vertebrae: No acute fracture. No primary bone lesion or focal pathologic process. Soft tissues and spinal canal: No prevertebral fluid or swelling. No visible canal hematoma. Disc levels:  No spinal canal stenosis. CT THORACIC SPINE FINDINGS Alignment: Normal. Vertebrae: There is a wedge compression fracture of T1 with less than 20% loss, involving the anterior wall and superior endplate. No retropulsion. There is also a fracture at T6 with approximately 25% height loss that is new since the prior study. The fracture involves the anterior and posterior walls and the superior endplate. Lower thoracic compression deformities are unchanged. Paraspinal and other soft tissues: Calcific aortic atherosclerosis Disc levels: No spinal canal stenosis CT LUMBAR SPINE FINDINGS Segmentation: 5 lumbar type vertebrae. Alignment: Grade 1 anterolisthesis at L4-5 Vertebrae: Chronic compression deformity of L1. Paraspinal and other soft tissues: Calcific aortic atherosclerosis. Disc levels: No spinal canal stenosis. IMPRESSION: 1. T1 and T6 compression fractures with 10-20% height loss, new since the prior study. No retropulsion or spinal canal stenosis. 2. Multiple other chronic compression deformities of the thoracic and lumbar spine. 3. No acute fracture or static subluxation of the cervical spine. Aortic Atherosclerosis (ICD10-I70.0). Electronically Signed   By: Ulyses Jarred M.D.   On: 07/05/2021 01:16   IR KYPHO THORACIC WITH BONE BIOPSY  Result Date: 07/11/2021 INDICATION: 87 year old female with past medical history significant for coronary artery disease,  carotid disease, dyslipidemia, hyperthyroidism and osteoporosis presenting with back pain after fall several days ago. CT of the thoracic spine showed acute compression fractures of the T1 and T6 vertebral body corresponding to point of tenderness on physical exam. Patient has persistent pain despite medical treatment. EXAM: FLUOROSCOPY GUIDED T6 CORE BONE BIOPSY AND BALLOON KYPHOPLASTY. COMPARISON:  CT of the thoracic spine July 05, 2021. MEDICATIONS: As antibiotic prophylaxis, Ancef 2 g IV was ordered pre-procedure and administered intravenously within 1 hour of incision. ANESTHESIA/SEDATION: Moderate (conscious) sedation was employed during this procedure. A total of Versed 1 mg and Fentanyl 37.5 mcg was administered intravenously. Moderate Sedation Time: 67 minutes. The patient's level of consciousness and vital signs were monitored continuously by radiology nursing throughout the procedure under my direct supervision. FLUOROSCOPY TIME:  Fluoroscopy Time: 19 minutes 2 seconds DAP (989 mGy). COMPLICATIONS: None immediate. PROCEDURE: Following a full explanation of the procedure along with the potential associated complications, an informed witnessed consent was obtained. The patient was placed in prone position on the angiography table. The thoracic spine region was prepped and draped in a sterile fashion. The  T1 vertebral body was well seen on 80 fluoroscopy. However, the shoulders were superimposed on the T1 vertebral body on lateral fluoroscopy. Multiple attempts to reposition the patient's arms in a way to provide a good lateral fluoroscopic window proved unsuccessful. Therefore, kyphoplasty of the T1 level was aborted. Under fluoroscopy, the T6 vertebral body was delineated and the skin area was marked. The skin was infiltrated with a 1% Lidocaine approximately 2.2 cm lateral to the spinous process projection on the right. Using a 22-gauge spinal needle, the soft issue and the peripedicular space and  periosteum were infiltrated with Bupivacaine 0.5%. A skin incision was made at the access site. Subsequently, an 11-gauge Kyphon trocar was inserted under fluoroscopic guidance until contact with the pedicle was obtained. The trocar was inserted under light hammer tapping into the pedicle until the posterior boundaries of the vertebral body was reached. The diamond mandrill was removed. The skin was infiltrated with a 1% Lidocaine approximately 3 cm lateral to the spinous process projection on the left. Using a 22-gauge spinal needle, the soft issue and the peripedicular space and periosteum were infiltrated with Bupivacaine 0.5%. A skin incision was made at the access site. Subsequently, an 11-gauge Kyphon trocar was inserted under fluoroscopic guidance until contact with the pedicle was obtained. The trocar was inserted under light hammer tapping into the pedicle until the posterior boundaries of the vertebral body was reached. The diamond mandrill was removed and one core biopsy wasobtained. An inflatable Kyphon balloon was advanced through the left pedicular access into the mid-aspect of the vertebral body. The balloon was inflated to create a void to serve as a repository for the bone cement. The balloon was deflated and through both cannulas, under continuous fluoroscopy guidance in the AP and lateral views, the vertebral body was filled with previously mixed polymethyl-methacrylate (PMMA) added to barium for opacification. Both cannulas were later removed. The access sites were cleaned and covered with a sterile bandage. IMPRESSION: 1. Successful fluoroscopy-guided bilateral transpedicular approach for T6 vertebral body core bone biopsy and kyphoplasty for treatment of osteoporotic fragility fracture. Bone sample obtained was sent for tissue exam. 2. T1 vertebral body kyphoplasty was aborted due to poor fluoroscopic visualization, as explained above. Electronically Signed   By: Baldemar Lenis  M.D.   On: 07/11/2021 17:11   ECHOCARDIOGRAM COMPLETE  Result Date: 07/05/2021    ECHOCARDIOGRAM REPORT   Patient Name:   Natasha Harper [facility] Date of Exam: 07/05/2021 Medical Rec #:  [id number]       Height:       61.0 in Accession #:    [id number]      Weight:       101.4 lb Date of Birth:  [birth year]      BSA:          1.415 m Patient Age:    86 years        BP:           165/72 mmHg Patient Gender: F               HR:           68 bpm. Exam Location:  Inpatient Procedure: 2D Echo, Color Doppler and Cardiac Doppler Indications:    NSTEMI  History:        Patient has no prior history of Echocardiogram examinations.                 Risk Factors:Hypertension and Dyslipidemia.  Sonographer:  Cleatis Polkaaylor Peper Referring Phys: [id number] Teddy SpikeYRONE A KYLE IMPRESSIONS  1. Left ventricular ejection fraction, by estimation, is 55 to 60%. The left ventricle has normal function. The left ventricle has no regional wall motion abnormalities. There is mild concentric left ventricular hypertrophy. Left ventricular diastolic parameters are consistent with Grade I diastolic dysfunction (impaired relaxation).  2. Right ventricular systolic function is normal. The right ventricular size is normal. There is normal pulmonary artery systolic pressure. The estimated right ventricular systolic pressure is 31.5 mmHg.  3. A small pericardial effusion is present. The pericardial effusion is posterior to the left ventricle.  4. The mitral valve is grossly normal. Mild mitral valve regurgitation. No evidence of mitral stenosis.  5. The aortic valve is tricuspid. There is mild calcification of the aortic valve. There is mild thickening of the aortic valve. Aortic valve regurgitation is not visualized. Aortic valve sclerosis is present, with no evidence of aortic valve stenosis.  6. The inferior vena cava is normal in size with greater than 50% respiratory variability, suggesting right atrial pressure of 3 mmHg. FINDINGS  Left Ventricle: Left  ventricular ejection fraction, by estimation, is 55 to 60%. The left ventricle has normal function. The left ventricle has no regional wall motion abnormalities. The left ventricular internal cavity size was normal in size. There is  mild concentric left ventricular hypertrophy. Left ventricular diastolic parameters are consistent with Grade I diastolic dysfunction (impaired relaxation). Right Ventricle: The right ventricular size is normal. No increase in right ventricular wall thickness. Right ventricular systolic function is normal. There is normal pulmonary artery systolic pressure. The tricuspid regurgitant velocity is 2.67 m/s, and  with an assumed right atrial pressure of 3 mmHg, the estimated right ventricular systolic pressure is 31.5 mmHg. Left Atrium: Left atrial size was normal in size. Right Atrium: Right atrial size was normal in size. Pericardium: A small pericardial effusion is present. The pericardial effusion is posterior to the left ventricle. Mitral Valve: The mitral valve is grossly normal. Mild mitral valve regurgitation. No evidence of mitral valve stenosis. Tricuspid Valve: The tricuspid valve is grossly normal. Tricuspid valve regurgitation is trivial. No evidence of tricuspid stenosis. Aortic Valve: The aortic valve is tricuspid. There is mild calcification of the aortic valve. There is mild thickening of the aortic valve. Aortic valve regurgitation is not visualized. Aortic valve sclerosis is present, with no evidence of aortic valve stenosis. Aortic valve peak gradient measures 5.5 mmHg. Pulmonic Valve: The pulmonic valve was grossly normal. Pulmonic valve regurgitation is trivial. No evidence of pulmonic stenosis. Aorta: The aortic root and ascending aorta are structurally normal, with no evidence of dilitation. Venous: The inferior vena cava is normal in size with greater than 50% respiratory variability, suggesting right atrial pressure of 3 mmHg. IAS/Shunts: The atrial septum is  grossly normal.  LEFT VENTRICLE PLAX 2D LVIDd:         4.00 cm     Diastology LVIDs:         2.50 cm     LV e' medial:    4.46 cm/s LV PW:         1.30 cm     LV E/e' medial:  17.4 LV IVS:        1.10 cm     LV e' lateral:   5.87 cm/s LVOT diam:     1.80 cm     LV E/e' lateral: 13.2 LV SV:         37 LV SV Index:  26 LVOT Area:     2.54 cm  LV Volumes (MOD) LV vol d, MOD A2C: 95.2 ml LV vol d, MOD A4C: 99.0 ml LV vol s, MOD A2C: 45.0 ml LV vol s, MOD A4C: 46.6 ml LV SV MOD A2C:     50.2 ml LV SV MOD A4C:     99.0 ml LV SV MOD BP:      53.4 ml RIGHT VENTRICLE             IVC RV Basal diam:  3.90 cm     IVC diam: 1.20 cm RV Mid diam:    3.70 cm RV S prime:     14.40 cm/s TAPSE (M-mode): 2.2 cm LEFT ATRIUM           Index        RIGHT ATRIUM           Index LA diam:      3.50 cm 2.47 cm/m   RA Area:     12.60 cm LA Vol (A2C): 25.6 ml 18.09 ml/m  RA Volume:   26.10 ml  18.44 ml/m LA Vol (A4C): 37.4 ml 26.43 ml/m  AORTIC VALVE AV Area (Vmax): 1.61 cm AV Vmax:        117.00 cm/s AV Peak Grad:   5.5 mmHg LVOT Vmax:      73.80 cm/s LVOT Vmean:     52.600 cm/s LVOT VTI:       0.146 m  AORTA Ao Root diam: 2.70 cm Ao Asc diam:  2.90 cm MITRAL VALVE               TRICUSPID VALVE MV Area (PHT): 4.15 cm    TR Peak grad:   28.5 mmHg MV Decel Time: 183 msec    TR Vmax:        267.00 cm/s MV E velocity: 77.60 cm/s MV A velocity: 95.50 cm/s  SHUNTS MV E/A ratio:  0.81        Systemic VTI:  0.15 m                            Systemic Diam: 1.80 cm Eleonore Chiquito MD Electronically signed by Eleonore Chiquito MD Signature Date/Time: 07/05/2021/1:54:33 PM    Final    US Abdomen Limited RUQ (LIVER/GB)  Result Date: 07/05/2021 CLINICAL DATA:  Elevated alkaline phosphatase. EXAM: ULTRASOUND ABDOMEN LIMITED RIGHT UPPER QUADRANT COMPARISON:  None. FINDINGS: Gallbladder: Gallbladder wall is UPPER limits normal, measuring 1.5 millimeters. No sonographic Murphy's sign. No stones or pericholecystic fluid. Common bile duct: Diameter: 3.0  millimeters Liver: No focal lesion identified. Within normal limits in parenchymal echogenicity. Portal vein is patent on color Doppler imaging with normal direction of blood flow towards the liver. Other: Study quality is degraded by limited patient mobility and difficulty with breath holding. IMPRESSION: No evidence for acute abnormality of the RIGHT UPPER QUADRANT. Electronically Signed   By: Nolon Nations M.D.   On: 07/05/2021 09:23     The results of significant diagnostics from this hospitalization (including imaging, microbiology, ancillary and laboratory) are listed below for reference.     Microbiology: No results found for this or any previous visit (from the past 240 hour(s)).   Labs: BNP (last 3 results) No results for input(s): BNP in the last 8760 hours. Basic Metabolic Panel: Recent Labs  Lab 07/20/21 1217  NA 132*  K 4.1  CL 104  CO2 25  GLUCOSE 101*  BUN 6*  CREATININE 0.38*  CALCIUM 8.4*  MG 1.7   Liver Function Tests: No results for input(s): AST, ALT, ALKPHOS, BILITOT, PROT, ALBUMIN in the last 168 hours. No results for input(s): LIPASE, AMYLASE in the last 168 hours. No results for input(s): AMMONIA in the last 168 hours. CBC: No results for input(s): WBC, NEUTROABS, HGB, HCT, MCV, PLT in the last 168 hours. Cardiac Enzymes: No results for input(s): CKTOTAL, CKMB, CKMBINDEX, TROPONINI in the last 168 hours. BNP: Invalid input(s): POCBNP CBG: No results for input(s): GLUCAP in the last 168 hours. D-Dimer No results for input(s): DDIMER in the last 72 hours. Hgb A1c No results for input(s): HGBA1C in the last 72 hours. Lipid Profile No results for input(s): CHOL, HDL, LDLCALC, TRIG, CHOLHDL, LDLDIRECT in the last 72 hours. Thyroid function studies No results for input(s): TSH, T4TOTAL, T3FREE, THYROIDAB in the last 72 hours.  Invalid input(s): FREET3 Anemia work up No results for input(s): VITAMINB12, FOLATE, FERRITIN, TIBC, IRON, RETICCTPCT in  the last 72 hours. Urinalysis    Component Value Date/Time   COLORURINE YELLOW 07/05/2021 0102   APPEARANCEUR HAZY (A) 07/05/2021 0102   LABSPEC >1.046 (H) 07/05/2021 0102   PHURINE 7.0 07/05/2021 0102   GLUCOSEU NEGATIVE 07/05/2021 0102   HGBUR NEGATIVE 07/05/2021 0102   HGBUR negative 07/19/2007 0851   BILIRUBINUR NEGATIVE 07/05/2021 0102   BILIRUBINUR n 03/18/2016 1328   KETONESUR NEGATIVE 07/05/2021 0102   PROTEINUR 30 (A) 07/05/2021 0102   UROBILINOGEN negative 03/18/2016 1328   UROBILINOGEN 0.2 07/19/2007 0851   NITRITE NEGATIVE 07/05/2021 0102   LEUKOCYTESUR NEGATIVE 07/05/2021 0102   Sepsis Labs Invalid input(s): PROCALCITONIN,  WBC,  LACTICIDVEN Microbiology No results found for this or any previous visit (from the past 240 hour(s)).   Time coordinating discharge in minutes: 65  SIGNED:   Debbe Odea, MD  [facility] 07/24/2021, 9:46 AM

## 2021-07-25 DIAGNOSIS — E039 Hypothyroidism, unspecified: Secondary | ICD-10-CM | POA: Diagnosis not present

## 2021-07-25 DIAGNOSIS — N6019 Diffuse cystic mastopathy of unspecified breast: Secondary | ICD-10-CM | POA: Diagnosis not present

## 2021-07-25 DIAGNOSIS — I779 Disorder of arteries and arterioles, unspecified: Secondary | ICD-10-CM | POA: Diagnosis not present

## 2021-07-25 DIAGNOSIS — E7800 Pure hypercholesterolemia, unspecified: Secondary | ICD-10-CM | POA: Diagnosis not present

## 2021-07-25 DIAGNOSIS — Z7982 Long term (current) use of aspirin: Secondary | ICD-10-CM | POA: Diagnosis not present

## 2021-07-25 DIAGNOSIS — I248 Other forms of acute ischemic heart disease: Secondary | ICD-10-CM | POA: Diagnosis not present

## 2021-07-25 DIAGNOSIS — Z79891 Long term (current) use of opiate analgesic: Secondary | ICD-10-CM | POA: Diagnosis not present

## 2021-07-25 DIAGNOSIS — M8008XD Age-related osteoporosis with current pathological fracture, vertebra(e), subsequent encounter for fracture with routine healing: Secondary | ICD-10-CM | POA: Diagnosis not present

## 2021-07-25 DIAGNOSIS — I2510 Atherosclerotic heart disease of native coronary artery without angina pectoris: Secondary | ICD-10-CM | POA: Diagnosis not present

## 2021-07-25 DIAGNOSIS — I10 Essential (primary) hypertension: Secondary | ICD-10-CM | POA: Diagnosis not present

## 2021-07-25 DIAGNOSIS — H9190 Unspecified hearing loss, unspecified ear: Secondary | ICD-10-CM | POA: Diagnosis not present

## 2021-08-05 ENCOUNTER — Telehealth: Payer: Self-pay | Admitting: Internal Medicine

## 2021-08-05 ENCOUNTER — Encounter (HOSPITAL_COMMUNITY): Payer: Self-pay | Admitting: Radiology

## 2021-08-05 NOTE — Telephone Encounter (Signed)
Left message for patient to call back and schedule Medicare Annual Wellness Visit (AWV) either virtually or in office. Left  my Natasha Harper number [phone]   Last AWV 12/20/14 ; please schedule at anytime with [facility] Nurse Health Advisor 1 or 2   This should be a 45 minute visit.

## 2021-08-06 ENCOUNTER — Encounter (HOSPITAL_COMMUNITY): Payer: Medicare Other

## 2021-09-04 ENCOUNTER — Emergency Department (HOSPITAL_COMMUNITY): Payer: Medicare Other

## 2021-09-04 ENCOUNTER — Encounter (HOSPITAL_COMMUNITY): Payer: Self-pay

## 2021-09-04 ENCOUNTER — Inpatient Hospital Stay (HOSPITAL_COMMUNITY)
Admission: EM | Admit: 2021-09-04 | Discharge: 2021-09-10 | DRG: 956 | Disposition: A | Discharge status: Skilled Nursing Facility | Payer: Medicare Other | Attending: Family Medicine | Admitting: Family Medicine

## 2021-09-04 ENCOUNTER — Other Ambulatory Visit: Payer: Self-pay

## 2021-09-04 DIAGNOSIS — E861 Hypovolemia: Secondary | ICD-10-CM | POA: Diagnosis not present

## 2021-09-04 DIAGNOSIS — K5900 Constipation, unspecified: Secondary | ICD-10-CM | POA: Diagnosis not present

## 2021-09-04 DIAGNOSIS — I10 Essential (primary) hypertension: Secondary | ICD-10-CM | POA: Diagnosis not present

## 2021-09-04 DIAGNOSIS — I2510 Atherosclerotic heart disease of native coronary artery without angina pectoris: Secondary | ICD-10-CM | POA: Diagnosis present

## 2021-09-04 DIAGNOSIS — Z79899 Other long term (current) drug therapy: Secondary | ICD-10-CM

## 2021-09-04 DIAGNOSIS — R030 Elevated blood-pressure reading, without diagnosis of hypertension: Secondary | ICD-10-CM | POA: Diagnosis not present

## 2021-09-04 DIAGNOSIS — M549 Dorsalgia, unspecified: Secondary | ICD-10-CM | POA: Diagnosis not present

## 2021-09-04 DIAGNOSIS — Z803 Family history of malignant neoplasm of breast: Secondary | ICD-10-CM

## 2021-09-04 DIAGNOSIS — Z87891 Personal history of nicotine dependence: Secondary | ICD-10-CM | POA: Diagnosis not present

## 2021-09-04 DIAGNOSIS — Z419 Encounter for procedure for purposes other than remedying health state, unspecified: Secondary | ICD-10-CM

## 2021-09-04 DIAGNOSIS — R339 Retention of urine, unspecified: Secondary | ICD-10-CM | POA: Diagnosis not present

## 2021-09-04 DIAGNOSIS — E7800 Pure hypercholesterolemia, unspecified: Secondary | ICD-10-CM | POA: Diagnosis not present

## 2021-09-04 DIAGNOSIS — M25552 Pain in left hip: Secondary | ICD-10-CM | POA: Diagnosis not present

## 2021-09-04 DIAGNOSIS — M4316 Spondylolisthesis, lumbar region: Secondary | ICD-10-CM | POA: Diagnosis not present

## 2021-09-04 DIAGNOSIS — Z20822 Contact with and (suspected) exposure to covid-19: Secondary | ICD-10-CM | POA: Diagnosis present

## 2021-09-04 DIAGNOSIS — Z043 Encounter for examination and observation following other accident: Secondary | ICD-10-CM | POA: Diagnosis not present

## 2021-09-04 DIAGNOSIS — Z7982 Long term (current) use of aspirin: Secondary | ICD-10-CM

## 2021-09-04 DIAGNOSIS — S32592A Other specified fracture of left pubis, initial encounter for closed fracture: Secondary | ICD-10-CM | POA: Diagnosis not present

## 2021-09-04 DIAGNOSIS — M25551 Pain in right hip: Secondary | ICD-10-CM

## 2021-09-04 DIAGNOSIS — R7309 Other abnormal glucose: Secondary | ICD-10-CM | POA: Diagnosis not present

## 2021-09-04 DIAGNOSIS — W010XXA Fall on same level from slipping, tripping and stumbling without subsequent striking against object, initial encounter: Secondary | ICD-10-CM | POA: Diagnosis present

## 2021-09-04 DIAGNOSIS — D649 Anemia, unspecified: Secondary | ICD-10-CM | POA: Diagnosis not present

## 2021-09-04 DIAGNOSIS — D62 Acute posthemorrhagic anemia: Secondary | ICD-10-CM | POA: Diagnosis present

## 2021-09-04 DIAGNOSIS — E039 Hypothyroidism, unspecified: Secondary | ICD-10-CM | POA: Diagnosis not present

## 2021-09-04 DIAGNOSIS — S72002A Fracture of unspecified part of neck of left femur, initial encounter for closed fracture: Secondary | ICD-10-CM | POA: Diagnosis not present

## 2021-09-04 DIAGNOSIS — E871 Hypo-osmolality and hyponatremia: Secondary | ICD-10-CM | POA: Diagnosis not present

## 2021-09-04 DIAGNOSIS — M810 Age-related osteoporosis without current pathological fracture: Secondary | ICD-10-CM | POA: Diagnosis present

## 2021-09-04 DIAGNOSIS — S72009A Fracture of unspecified part of neck of unspecified femur, initial encounter for closed fracture: Secondary | ICD-10-CM | POA: Diagnosis not present

## 2021-09-04 DIAGNOSIS — E785 Hyperlipidemia, unspecified: Secondary | ICD-10-CM | POA: Diagnosis not present

## 2021-09-04 DIAGNOSIS — S32501A Unspecified fracture of right pubis, initial encounter for closed fracture: Secondary | ICD-10-CM | POA: Diagnosis not present

## 2021-09-04 DIAGNOSIS — S72002D Fracture of unspecified part of neck of left femur, subsequent encounter for closed fracture with routine healing: Secondary | ICD-10-CM | POA: Diagnosis not present

## 2021-09-04 DIAGNOSIS — Y92002 Bathroom of unspecified non-institutional (private) residence single-family (private) house as the place of occurrence of the external cause: Secondary | ICD-10-CM | POA: Diagnosis not present

## 2021-09-04 DIAGNOSIS — W19XXXA Unspecified fall, initial encounter: Secondary | ICD-10-CM

## 2021-09-04 DIAGNOSIS — Z8249 Family history of ischemic heart disease and other diseases of the circulatory system: Secondary | ICD-10-CM | POA: Diagnosis not present

## 2021-09-04 DIAGNOSIS — Z955 Presence of coronary angioplasty implant and graft: Secondary | ICD-10-CM

## 2021-09-04 DIAGNOSIS — M1611 Unilateral primary osteoarthritis, right hip: Secondary | ICD-10-CM | POA: Diagnosis not present

## 2021-09-04 DIAGNOSIS — S72142A Displaced intertrochanteric fracture of left femur, initial encounter for closed fracture: Principal | ICD-10-CM | POA: Diagnosis present

## 2021-09-04 DIAGNOSIS — R338 Other retention of urine: Secondary | ICD-10-CM

## 2021-09-04 DIAGNOSIS — Z79890 Hormone replacement therapy (postmenopausal): Secondary | ICD-10-CM

## 2021-09-04 DIAGNOSIS — M25751 Osteophyte, right hip: Secondary | ICD-10-CM | POA: Diagnosis not present

## 2021-09-04 DIAGNOSIS — I739 Peripheral vascular disease, unspecified: Secondary | ICD-10-CM | POA: Diagnosis not present

## 2021-09-04 DIAGNOSIS — E876 Hypokalemia: Secondary | ICD-10-CM | POA: Diagnosis not present

## 2021-09-04 DIAGNOSIS — M899 Disorder of bone, unspecified: Secondary | ICD-10-CM | POA: Diagnosis not present

## 2021-09-04 DIAGNOSIS — Y92009 Unspecified place in unspecified non-institutional (private) residence as the place of occurrence of the external cause: Secondary | ICD-10-CM

## 2021-09-04 MED ORDER — FENTANYL CITRATE PF 50 MCG/ML IJ SOSY
50.0000 ug | PREFILLED_SYRINGE | Freq: Once | INTRAMUSCULAR | Status: AC
  Administered 2021-09-05: 50 ug via INTRAVENOUS
  Filled 2021-09-04: qty 1

## 2021-09-04 NOTE — ED Triage Notes (Addendum)
Pt BIB EMS from home, pt lives with husband. Pt uses walker at baseline, pt fell back on her buttocks and is c/o left hip pain. Pt does not take blood thinners, pt denies hitting head, denies LOC. Pt has hx of left hip compression fractures, HTN.  ?EMS gave 100 mcg of fentanyl ?18 G L FA ?179/74 ?80 HR ?95% RA ? ?

## 2021-09-05 ENCOUNTER — Emergency Department (HOSPITAL_COMMUNITY): Payer: Medicare Other

## 2021-09-05 ENCOUNTER — Inpatient Hospital Stay (HOSPITAL_COMMUNITY): Payer: Medicare Other | Admitting: Certified Registered Nurse Anesthetist

## 2021-09-05 ENCOUNTER — Encounter (HOSPITAL_COMMUNITY)
Admission: EM | Disposition: A | Discharge status: Skilled Nursing Facility | Payer: Self-pay | Source: Home / Self Care | Attending: Family Medicine

## 2021-09-05 ENCOUNTER — Inpatient Hospital Stay (HOSPITAL_COMMUNITY): Payer: Medicare Other

## 2021-09-05 ENCOUNTER — Other Ambulatory Visit: Payer: Self-pay

## 2021-09-05 ENCOUNTER — Encounter (HOSPITAL_COMMUNITY): Payer: Self-pay | Admitting: Internal Medicine

## 2021-09-05 DIAGNOSIS — S72009A Fracture of unspecified part of neck of unspecified femur, initial encounter for closed fracture: Secondary | ICD-10-CM

## 2021-09-05 DIAGNOSIS — Z87891 Personal history of nicotine dependence: Secondary | ICD-10-CM | POA: Diagnosis not present

## 2021-09-05 DIAGNOSIS — E861 Hypovolemia: Secondary | ICD-10-CM | POA: Diagnosis present

## 2021-09-05 DIAGNOSIS — E871 Hypo-osmolality and hyponatremia: Secondary | ICD-10-CM | POA: Diagnosis present

## 2021-09-05 DIAGNOSIS — Z7982 Long term (current) use of aspirin: Secondary | ICD-10-CM | POA: Diagnosis not present

## 2021-09-05 DIAGNOSIS — D62 Acute posthemorrhagic anemia: Secondary | ICD-10-CM | POA: Diagnosis present

## 2021-09-05 DIAGNOSIS — Z79899 Other long term (current) drug therapy: Secondary | ICD-10-CM | POA: Diagnosis not present

## 2021-09-05 DIAGNOSIS — Y92002 Bathroom of unspecified non-institutional (private) residence single-family (private) house as the place of occurrence of the external cause: Secondary | ICD-10-CM | POA: Diagnosis not present

## 2021-09-05 DIAGNOSIS — Z955 Presence of coronary angioplasty implant and graft: Secondary | ICD-10-CM | POA: Diagnosis not present

## 2021-09-05 DIAGNOSIS — I2510 Atherosclerotic heart disease of native coronary artery without angina pectoris: Secondary | ICD-10-CM

## 2021-09-05 DIAGNOSIS — I739 Peripheral vascular disease, unspecified: Secondary | ICD-10-CM | POA: Diagnosis present

## 2021-09-05 DIAGNOSIS — E039 Hypothyroidism, unspecified: Secondary | ICD-10-CM

## 2021-09-05 DIAGNOSIS — W19XXXA Unspecified fall, initial encounter: Secondary | ICD-10-CM

## 2021-09-05 DIAGNOSIS — S72002A Fracture of unspecified part of neck of left femur, initial encounter for closed fracture: Secondary | ICD-10-CM

## 2021-09-05 DIAGNOSIS — Z8249 Family history of ischemic heart disease and other diseases of the circulatory system: Secondary | ICD-10-CM | POA: Diagnosis not present

## 2021-09-05 DIAGNOSIS — E785 Hyperlipidemia, unspecified: Secondary | ICD-10-CM | POA: Diagnosis not present

## 2021-09-05 DIAGNOSIS — Z79890 Hormone replacement therapy (postmenopausal): Secondary | ICD-10-CM | POA: Diagnosis not present

## 2021-09-05 DIAGNOSIS — Z20822 Contact with and (suspected) exposure to covid-19: Secondary | ICD-10-CM | POA: Diagnosis present

## 2021-09-05 DIAGNOSIS — I10 Essential (primary) hypertension: Secondary | ICD-10-CM | POA: Diagnosis not present

## 2021-09-05 DIAGNOSIS — S72142A Displaced intertrochanteric fracture of left femur, initial encounter for closed fracture: Secondary | ICD-10-CM

## 2021-09-05 DIAGNOSIS — M25552 Pain in left hip: Secondary | ICD-10-CM | POA: Diagnosis present

## 2021-09-05 DIAGNOSIS — W010XXA Fall on same level from slipping, tripping and stumbling without subsequent striking against object, initial encounter: Secondary | ICD-10-CM | POA: Diagnosis present

## 2021-09-05 DIAGNOSIS — Z803 Family history of malignant neoplasm of breast: Secondary | ICD-10-CM | POA: Diagnosis not present

## 2021-09-05 DIAGNOSIS — S32592A Other specified fracture of left pubis, initial encounter for closed fracture: Secondary | ICD-10-CM | POA: Diagnosis present

## 2021-09-05 DIAGNOSIS — R339 Retention of urine, unspecified: Secondary | ICD-10-CM | POA: Diagnosis not present

## 2021-09-05 DIAGNOSIS — M810 Age-related osteoporosis without current pathological fracture: Secondary | ICD-10-CM | POA: Diagnosis present

## 2021-09-05 DIAGNOSIS — E7800 Pure hypercholesterolemia, unspecified: Secondary | ICD-10-CM | POA: Diagnosis present

## 2021-09-05 HISTORY — PX: FEMUR IM NAIL: SHX1597

## 2021-09-05 HISTORY — DX: Fracture of unspecified part of neck of unspecified femur, initial encounter for closed fracture: S72.009A

## 2021-09-05 LAB — BASIC METABOLIC PANEL
Anion gap: 7 (ref 5–15)
BUN: 19 mg/dL (ref 8–23)
CO2: 24 mmol/L (ref 22–32)
Calcium: 9 mg/dL (ref 8.9–10.3)
Chloride: 99 mmol/L (ref 98–111)
Creatinine, Ser: 0.3 mg/dL — ABNORMAL LOW (ref 0.44–1.00)
GFR, Estimated: >60 mL/min (ref >60)
Glucose, Bld: 114 mg/dL — ABNORMAL HIGH (ref 70–99)
Potassium: 3.8 mmol/L (ref 3.5–5.1)
Sodium: 130 mmol/L — ABNORMAL LOW (ref 135–145)

## 2021-09-05 LAB — ABO/RH: ABO/RH(D): A POS

## 2021-09-05 LAB — RESP PANEL BY RT-PCR (FLU A&B, COVID) ARPGX2
Influenza A by PCR: NEGATIVE
Influenza B by PCR: NEGATIVE
SARS Coronavirus 2 by RT PCR: NEGATIVE

## 2021-09-05 LAB — CBC WITH DIFFERENTIAL/PLATELET
Abs Immature Granulocytes: 0.05 10*3/uL (ref 0.00–0.07)
Basophils Absolute: 0 10*3/uL (ref 0.0–0.1)
Basophils Relative: 0 %
Eosinophils Absolute: 0 10*3/uL (ref 0.0–0.5)
Eosinophils Relative: 0 %
HCT: 30.3 % — ABNORMAL LOW (ref 36.0–46.0)
Hemoglobin: 10 g/dL — ABNORMAL LOW (ref 12.0–15.0)
Immature Granulocytes: 1 %
Lymphocytes Relative: 9 %
Lymphs Abs: 0.7 10*3/uL (ref 0.7–4.0)
MCH: 34.2 pg — ABNORMAL HIGH (ref 26.0–34.0)
MCHC: 33 g/dL (ref 30.0–36.0)
MCV: 103.8 fL — ABNORMAL HIGH (ref 80.0–100.0)
Monocytes Absolute: 0.5 10*3/uL (ref 0.1–1.0)
Monocytes Relative: 6 %
Neutro Abs: 6.3 10*3/uL (ref 1.7–7.7)
Neutrophils Relative %: 84 %
Platelets: 318 10*3/uL (ref 150–400)
RBC: 2.92 MIL/uL — ABNORMAL LOW (ref 3.87–5.11)
RDW: 14.5 % (ref 11.5–15.5)
WBC: 7.5 10*3/uL (ref 4.0–10.5)
nRBC: 0 % (ref 0.0–0.2)

## 2021-09-05 LAB — SURGICAL PCR SCREEN
MRSA, PCR: NEGATIVE
Staphylococcus aureus: NEGATIVE

## 2021-09-05 SURGERY — INSERTION, INTRAMEDULLARY ROD, FEMUR
Anesthesia: Spinal | Laterality: Left

## 2021-09-05 MED ORDER — FENTANYL CITRATE PF 50 MCG/ML IJ SOSY: 25.0000 ug | PREFILLED_SYRINGE | INTRAMUSCULAR | Status: DC | PRN

## 2021-09-05 MED ORDER — ONDANSETRON HCL 4 MG/2ML IJ SOLN
INTRAMUSCULAR | Status: AC
  Filled 2021-09-05: qty 2

## 2021-09-05 MED ORDER — HYDROCODONE-ACETAMINOPHEN 5-325 MG PO TABS
1.0000 | ORAL_TABLET | ORAL | Status: DC | PRN
  Administered 2021-09-06 – 2021-09-09 (×4): 2 via ORAL
  Filled 2021-09-05 (×2): qty 2
  Filled 2021-09-05: qty 1
  Filled 2021-09-05 (×2): qty 2

## 2021-09-05 MED ORDER — ONDANSETRON HCL 4 MG PO TABS: 4.0000 mg | ORAL_TABLET | Freq: Four times a day (QID) | ORAL | Status: DC | PRN

## 2021-09-05 MED ORDER — PROPOFOL 10 MG/ML IV BOLUS
INTRAVENOUS | Status: DC | PRN
  Administered 2021-09-05: 30 mg via INTRAVENOUS

## 2021-09-05 MED ORDER — DEXAMETHASONE SODIUM PHOSPHATE 10 MG/ML IJ SOLN
INTRAMUSCULAR | Status: AC
  Filled 2021-09-05: qty 1

## 2021-09-05 MED ORDER — METOCLOPRAMIDE HCL 5 MG/ML IJ SOLN: 5.0000 mg | Freq: Three times a day (TID) | INTRAMUSCULAR | Status: DC | PRN

## 2021-09-05 MED ORDER — BUPIVACAINE IN DEXTROSE 0.75-8.25 % IT SOLN
INTRATHECAL | Status: DC | PRN
Start: 2021-09-05 — End: 2021-09-05
  Administered 2021-09-05: 1.6 mL via INTRATHECAL

## 2021-09-05 MED ORDER — MORPHINE SULFATE (PF) 4 MG/ML IV SOLN
4.0000 mg | Freq: Once | INTRAVENOUS | Status: AC
  Administered 2021-09-05: 4 mg via INTRAVENOUS
  Filled 2021-09-05: qty 1

## 2021-09-05 MED ORDER — ACETAMINOPHEN 325 MG PO TABS
325.0000 mg | ORAL_TABLET | Freq: Four times a day (QID) | ORAL | Status: DC | PRN
  Administered 2021-09-09 – 2021-09-10 (×2): 650 mg via ORAL
  Filled 2021-09-05 (×2): qty 2

## 2021-09-05 MED ORDER — CEFAZOLIN SODIUM-DEXTROSE 2-4 GM/100ML-% IV SOLN
2.0000 g | Freq: Four times a day (QID) | INTRAVENOUS | Status: AC
  Administered 2021-09-05 – 2021-09-06 (×2): 2 g via INTRAVENOUS
  Filled 2021-09-05 (×2): qty 100

## 2021-09-05 MED ORDER — PHENYLEPHRINE HCL-NACL 20-0.9 MG/250ML-% IV SOLN
INTRAVENOUS | Status: DC | PRN
Start: 2021-09-05 — End: 2021-09-05
  Administered 2021-09-05: 50 ug/min via INTRAVENOUS

## 2021-09-05 MED ORDER — TRANEXAMIC ACID-NACL 1000-0.7 MG/100ML-% IV SOLN
1000.0000 mg | Freq: Once | INTRAVENOUS | Status: AC
  Administered 2021-09-05: 1000 mg via INTRAVENOUS
  Filled 2021-09-05: qty 100

## 2021-09-05 MED ORDER — TRANEXAMIC ACID 1000 MG/10ML IV SOLN: 1.5000 mg/kg/h | INTRAVENOUS | Status: DC

## 2021-09-05 MED ORDER — PANTOPRAZOLE SODIUM 40 MG PO TBEC
40.0000 mg | DELAYED_RELEASE_TABLET | Freq: Every day | ORAL | Status: DC
  Administered 2021-09-05 – 2021-09-10 (×6): 40 mg via ORAL
  Filled 2021-09-05 (×6): qty 1

## 2021-09-05 MED ORDER — ATORVASTATIN CALCIUM 40 MG PO TABS
80.0000 mg | ORAL_TABLET | Freq: Every day | ORAL | Status: DC
  Administered 2021-09-05 – 2021-09-09 (×5): 80 mg via ORAL
  Filled 2021-09-05 (×5): qty 2

## 2021-09-05 MED ORDER — MORPHINE SULFATE (PF) 2 MG/ML IV SOLN
INTRAVENOUS | Status: AC
  Administered 2021-09-05: 1 mg via INTRAVENOUS
  Filled 2021-09-05: qty 1

## 2021-09-05 MED ORDER — DEXAMETHASONE SODIUM PHOSPHATE 10 MG/ML IJ SOLN
INTRAMUSCULAR | Status: DC | PRN
  Administered 2021-09-05: 4 mg via INTRAVENOUS

## 2021-09-05 MED ORDER — MORPHINE SULFATE (PF) 2 MG/ML IV SOLN: 0.5000 mg | INTRAVENOUS | Status: DC | PRN

## 2021-09-05 MED ORDER — EPHEDRINE 5 MG/ML INJ
INTRAVENOUS | Status: AC
  Filled 2021-09-05: qty 5

## 2021-09-05 MED ORDER — METHOCARBAMOL 500 MG PO TABS
500.0000 mg | ORAL_TABLET | Freq: Four times a day (QID) | ORAL | Status: DC | PRN
  Administered 2021-09-07 – 2021-09-09 (×4): 500 mg via ORAL
  Filled 2021-09-05 (×4): qty 1

## 2021-09-05 MED ORDER — PHENOL 1.4 % MT LIQD: 1.0000 | OROMUCOSAL | Status: DC | PRN

## 2021-09-05 MED ORDER — DOCUSATE SODIUM 100 MG PO CAPS
100.0000 mg | ORAL_CAPSULE | Freq: Two times a day (BID) | ORAL | Status: DC
  Administered 2021-09-05 – 2021-09-09 (×7): 100 mg via ORAL
  Filled 2021-09-05 (×8): qty 1

## 2021-09-05 MED ORDER — TRANEXAMIC ACID-NACL 1000-0.7 MG/100ML-% IV SOLN
1000.0000 mg | INTRAVENOUS | Status: DC
  Filled 2021-09-05: qty 100

## 2021-09-05 MED ORDER — METOCLOPRAMIDE HCL 5 MG PO TABS: 5.0000 mg | ORAL_TABLET | Freq: Three times a day (TID) | ORAL | Status: DC | PRN

## 2021-09-05 MED ORDER — PROPOFOL 500 MG/50ML IV EMUL
INTRAVENOUS | Status: DC | PRN
  Administered 2021-09-05: 75 ug/kg/min via INTRAVENOUS

## 2021-09-05 MED ORDER — MENTHOL 3 MG MT LOZG: 1.0000 | LOZENGE | OROMUCOSAL | Status: DC | PRN

## 2021-09-05 MED ORDER — PHENYLEPHRINE 40 MCG/ML (10ML) SYRINGE FOR IV PUSH (FOR BLOOD PRESSURE SUPPORT)
PREFILLED_SYRINGE | INTRAVENOUS | Status: AC
  Filled 2021-09-05: qty 10

## 2021-09-05 MED ORDER — CEFAZOLIN SODIUM-DEXTROSE 2-4 GM/100ML-% IV SOLN
2.0000 g | Freq: Once | INTRAVENOUS | Status: DC
Start: 2021-09-05 — End: 2021-09-05
  Filled 2021-09-05: qty 100

## 2021-09-05 MED ORDER — ASPIRIN EC 81 MG PO TBEC: 81.0000 mg | DELAYED_RELEASE_TABLET | Freq: Every day | ORAL | Status: DC

## 2021-09-05 MED ORDER — ONDANSETRON HCL 4 MG/2ML IJ SOLN: 4.0000 mg | Freq: Once | INTRAMUSCULAR | Status: DC | PRN

## 2021-09-05 MED ORDER — LEVOTHYROXINE SODIUM 50 MCG PO TABS
50.0000 ug | ORAL_TABLET | Freq: Every day | ORAL | Status: DC
  Administered 2021-09-06 – 2021-09-10 (×5): 50 ug via ORAL
  Filled 2021-09-05 (×5): qty 1

## 2021-09-05 MED ORDER — ONDANSETRON HCL 4 MG/2ML IJ SOLN
INTRAMUSCULAR | Status: DC | PRN
  Administered 2021-09-05: 4 mg via INTRAVENOUS

## 2021-09-05 MED ORDER — HYDROCODONE-ACETAMINOPHEN 7.5-325 MG PO TABS
1.0000 | ORAL_TABLET | ORAL | Status: DC | PRN
  Administered 2021-09-06: 1 via ORAL
  Administered 2021-09-06 – 2021-09-08 (×4): 2 via ORAL
  Filled 2021-09-05: qty 1
  Filled 2021-09-05 (×4): qty 2
  Filled 2021-09-05: qty 1

## 2021-09-05 MED ORDER — ALUM & MAG HYDROXIDE-SIMETH 200-200-20 MG/5ML PO SUSP: 30.0000 mL | ORAL | Status: DC | PRN

## 2021-09-05 MED ORDER — METHOCARBAMOL 1000 MG/10ML IJ SOLN
500.0000 mg | Freq: Four times a day (QID) | INTRAVENOUS | Status: DC | PRN
  Filled 2021-09-05: qty 5

## 2021-09-05 MED ORDER — ASPIRIN EC 325 MG PO TBEC
325.0000 mg | DELAYED_RELEASE_TABLET | Freq: Every day | ORAL | Status: DC
  Administered 2021-09-06 – 2021-09-10 (×5): 325 mg via ORAL
  Filled 2021-09-05 (×5): qty 1

## 2021-09-05 MED ORDER — MORPHINE SULFATE (PF) 2 MG/ML IV SOLN
1.0000 mg | INTRAVENOUS | Status: DC | PRN
  Administered 2021-09-05 (×3): 1 mg via INTRAVENOUS
  Filled 2021-09-05 (×3): qty 1

## 2021-09-05 MED ORDER — ONDANSETRON HCL 4 MG/2ML IJ SOLN
4.0000 mg | Freq: Once | INTRAMUSCULAR | Status: AC
  Administered 2021-09-05: 4 mg via INTRAVENOUS
  Filled 2021-09-05: qty 2

## 2021-09-05 MED ORDER — LACTATED RINGERS IV SOLN: INTRAVENOUS | Status: DC

## 2021-09-05 MED ORDER — OMEGA-3-ACID ETHYL ESTERS 1 G PO CAPS
1000.0000 mg | ORAL_CAPSULE | Freq: Every day | ORAL | Status: DC
  Administered 2021-09-07 – 2021-09-10 (×4): 1000 mg via ORAL
  Filled 2021-09-05 (×5): qty 1

## 2021-09-05 MED ORDER — PROPOFOL 1000 MG/100ML IV EMUL
INTRAVENOUS | Status: AC
  Filled 2021-09-05: qty 100

## 2021-09-05 MED ORDER — FOLIC ACID 1 MG PO TABS
1.0000 mg | ORAL_TABLET | Freq: Every day | ORAL | Status: DC
  Administered 2021-09-06 – 2021-09-10 (×5): 1 mg via ORAL
  Filled 2021-09-05 (×5): qty 1

## 2021-09-05 MED ORDER — MORPHINE SULFATE (PF) 2 MG/ML IV SOLN: 1.0000 mg | INTRAVENOUS | Status: AC

## 2021-09-05 MED ORDER — ONDANSETRON HCL 4 MG/2ML IJ SOLN: 4.0000 mg | Freq: Four times a day (QID) | INTRAMUSCULAR | Status: DC | PRN

## 2021-09-05 MED ORDER — MORPHINE SULFATE (PF) 2 MG/ML IV SOLN
1.0000 mg | INTRAVENOUS | Status: AC
  Administered 2021-09-05: 1 mg via INTRAVENOUS

## 2021-09-05 SURGICAL SUPPLY — 37 items
BAG COUNTER SPONGE SURGICOUNT (BAG) IMPLANT
BAG SPNG CNTER NS LX DISP (BAG)
BIT DRILL INTERTAN LAG SCREW (BIT) ×1 IMPLANT
BNDG CMPR MED 10X6 ELC LF (GAUZE/BANDAGES/DRESSINGS) ×1
BNDG ELASTIC 6X10 VLCR STRL LF (GAUZE/BANDAGES/DRESSINGS) ×2 IMPLANT
BNDG GAUZE ELAST 4 BULKY (GAUZE/BANDAGES/DRESSINGS) ×2 IMPLANT
COVER PERINEAL POST (MISCELLANEOUS) ×2 IMPLANT
DRAPE STERI IOBAN 125X83 (DRAPES) ×2 IMPLANT
DRSG AQUACEL AG ADV 3.5X 4 (GAUZE/BANDAGES/DRESSINGS) ×4 IMPLANT
DURAPREP 26ML APPLICATOR (WOUND CARE) ×2 IMPLANT
ELECT REM PT RETURN 15FT ADLT (MISCELLANEOUS) ×2 IMPLANT
FACESHIELD WRAPAROUND (MASK) ×4 IMPLANT
FACESHIELD WRAPAROUND OR TEAM (MASK) ×2 IMPLANT
GAUZE XEROFORM 1X8 LF (GAUZE/BANDAGES/DRESSINGS) ×1 IMPLANT
GLOVE SRG 8 PF TXTR STRL LF DI (GLOVE) ×2 IMPLANT
GLOVE SURG NEOPR MICRO LF SZ8 (GLOVE) ×2 IMPLANT
GLOVE SURG ORTHO LTX SZ7.5 (GLOVE) ×2 IMPLANT
GLOVE SURG UNDER POLY LF SZ8 (GLOVE) ×4
GOWN STRL REUS W/ TWL XL LVL3 (GOWN DISPOSABLE) ×2 IMPLANT
GOWN STRL REUS W/TWL XL LVL3 (GOWN DISPOSABLE) ×6
GUIDE PIN 3.2X343 (PIN) ×2
GUIDE PIN 3.2X343MM (PIN) ×4
KIT BASIN OR (CUSTOM PROCEDURE TRAY) ×2 IMPLANT
KIT TURNOVER KIT A (KITS) ×1 IMPLANT
MANIFOLD NEPTUNE II (INSTRUMENTS) ×2 IMPLANT
NAIL LEFT 10X36 (Nail) ×1 IMPLANT
NS IRRIG 1000ML POUR BTL (IV SOLUTION) ×2 IMPLANT
PACK GENERAL/GYN (CUSTOM PROCEDURE TRAY) ×2 IMPLANT
PIN GUIDE 3.2X343MM (PIN) IMPLANT
PROTECTOR NERVE ULNAR (MISCELLANEOUS) ×3 IMPLANT
SCREW LAG COMPR KIT 95/90 (Screw) ×1 IMPLANT
STAPLER VISISTAT 35W (STAPLE) ×2 IMPLANT
SUT VIC AB 0 CT1 27 (SUTURE) ×2
SUT VIC AB 0 CT1 27XBRD ANTBC (SUTURE) ×1 IMPLANT
SUT VIC AB 2-0 CT1 27 (SUTURE) ×2
SUT VIC AB 2-0 CT1 TAPERPNT 27 (SUTURE) ×1 IMPLANT
TOWEL OR 17X26 10 PK STRL BLUE (TOWEL DISPOSABLE) ×2 IMPLANT

## 2021-09-05 NOTE — Consult Note (Signed)
Reason for Consult:  Left hip fracture Referring Physician: EDP  Natasha Harper is an 87 y.o. female.  HPI:   The patient is an 87 year old female who sustained a mechanical fall while last evening.  She was seen at the [facility] emergency room after being brought there by EMS.  She is found to have a left hip intertrochanteric fracture.  She does complain of a significant amount of left hip pain.  Her husband is at the bedside with her.  He states she has had a series of falls recently.  She does have a history of compression fractures in her spine and even I kyphoplasty or vertebroplasty sometime last year.  She does have hearing loss.  She is able to follow commands.  Orthopedic surgery was consulted to address her left acute hip fracture.  Past Medical History:  Diagnosis Date   CAD (coronary artery disease)    PCI to RCA and diagonal in remote past, residual 70% LAD  /   nuclear, 2007, no ischemia   Carotid arterial disease (HCC)    Doppler, June, 2011, stable, 60-79% R. ICA, 40-59% LICA   Contact lens/glasses fitting    wears contacts or glasses   Dyslipidemia    Significant drop in LDL from Lipitor even though LDL remains high   Ejection fraction    EF 65%, nuclear, 2007   Fibrocystic breast    HTN (hypertension)     no med   Hypercholesterolemia    Hypothyroidism    Thyroid surgery in the past, thyroid nodules followed by Dr.Ellison   Lump or mass in breast 07/19/2007   Excised 01/12/13. B9 on pathQualifier: Diagnosis of  By: Fabian Sharp MD, Neta Mends     Osteoporosis    Prominent abdominal aortic pulsation    No abdominal aneurysm by ultrasound   Rectal fissure     Past Surgical History:  Procedure Laterality Date   BREAST BIOPSY Right 01/12/2013   Procedure: Removal of right breast mass;  Surgeon: Currie Paris, MD;  Location: [facility];  Service: General;  Laterality: Right;   BREAST EXCISIONAL BIOPSY  9/14   scar tissue from the needle biopsy   CORONARY  ANGIOPLASTY  1993   IR KYPHO THORACIC WITH BONE BIOPSY  07/11/2021   RECTOPERITONEAL FISTULA CLOSURE     THYROID CYST EXCISION      Family History  Problem Relation Age of Onset   Heart attack Father    Osteoporosis Mother    Breast cancer Daughter     Social History:  reports that she quit smoking about 63 years ago. She has never used smokeless tobacco. She reports that she does not drink alcohol and does not use drugs.  Allergies:  Allergies  Allergen Reactions   Actonel [Risedronate Sodium]     bloating   Evista [Raloxifene]     Abdominal cramps   Fosamax [Alendronate Sodium]     Abdominal cramps    Medications: I have reviewed the patient's current medications.  Results for orders placed or performed during the hospital encounter of 09/04/21 (from the past 48 hour(s))  Basic metabolic panel     Status: Abnormal   Collection Time: 09/05/21  1:50 AM  Result Value Ref Range   Sodium 130 (L) 135 - 145 mmol/L   Potassium 3.8 3.5 - 5.1 mmol/L   Chloride 99 98 - 111 mmol/L   CO2 24 22 - 32 mmol/L   Glucose, Bld 114 (H) 70 - 99  mg/dL    Comment: Glucose reference range applies only to samples taken after fasting for at least 8 hours.   BUN 19 8 - 23 mg/dL   Creatinine, Ser 7.84 (L) 0.44 - 1.00 mg/dL   Calcium 9.0 8.9 - 69.6 mg/dL   GFR, Estimated >29 >52 mL/min    Comment: (NOTE) Calculated using the CKD-EPI Creatinine Equation (2021)    Anion gap 7 5 - 15    Comment: Performed at [facility], [address], [place], [place] [address]  CBC with Differential     Status: Abnormal   Collection Time: 09/05/21  1:50 AM  Result Value Ref Range   WBC 7.5 4.0 - 10.5 K/uL   RBC 2.92 (L) 3.87 - 5.11 MIL/uL   Hemoglobin 10.0 (L) 12.0 - 15.0 g/dL   HCT 44.0 (L) 10.2 - 72.5 %   MCV 103.8 (H) 80.0 - 100.0 fL   MCH 34.2 (H) 26.0 - 34.0 pg   MCHC 33.0 30.0 - 36.0 g/dL   RDW 36.6 44.0 - 34.7 %   Platelets 318 150 - 400 K/uL   nRBC 0.0 0.0 - 0.2 %    Neutrophils Relative % 84 %   Neutro Abs 6.3 1.7 - 7.7 K/uL   Lymphocytes Relative 9 %   Lymphs Abs 0.7 0.7 - 4.0 K/uL   Monocytes Relative 6 %   Monocytes Absolute 0.5 0.1 - 1.0 K/uL   Eosinophils Relative 0 %   Eosinophils Absolute 0.0 0.0 - 0.5 K/uL   Basophils Relative 0 %   Basophils Absolute 0.0 0.0 - 0.1 K/uL   Immature Granulocytes 1 %   Abs Immature Granulocytes 0.05 0.00 - 0.07 K/uL    Comment: Performed at [facility], [address] [place], [place] [address]  Resp Panel by RT-PCR (Flu A&B, Covid) Nasopharyngeal Swab     Status: None   Collection Time: 09/05/21  1:50 AM   Specimen: Nasopharyngeal Swab; Nasopharyngeal(NP) swabs in vial transport medium  Result Value Ref Range   SARS Coronavirus 2 by RT PCR NEGATIVE NEGATIVE    Comment: (NOTE) SARS-CoV-2 target nucleic acids are NOT DETECTED.  The SARS-CoV-2 RNA is generally detectable in upper respiratory specimens during the acute phase of infection. The lowest concentration of SARS-CoV-2 viral copies this assay can detect is 138 copies/mL. A negative result does not preclude SARS-Cov-2 infection and should not be used as the sole basis for treatment or other patient management decisions. A negative result may occur with  improper specimen collection/handling, submission of specimen other than nasopharyngeal swab, presence of viral mutation(s) within the areas targeted by this assay, and inadequate number of viral copies(<138 copies/mL). A negative result must be combined with clinical observations, patient history, and epidemiological information. The expected result is Negative.  Fact Sheet for Patients:  BloggerCourse.com  Fact Sheet for Healthcare Providers:  SeriousBroker.it  This test is no t yet approved or cleared by the [place] FDA and  has been authorized for detection and/or diagnosis of SARS-CoV-2 by FDA under an Emergency Use  Authorization (EUA). This EUA will remain  in effect (meaning this test can be used) for the duration of the COVID-19 declaration under Section 564(b)(1) of the Act, 21 U.S.C.section 360bbb-3(b)(1), unless the authorization is terminated  or revoked sooner.       Influenza A by PCR NEGATIVE NEGATIVE   Influenza B by PCR NEGATIVE NEGATIVE    Comment: (NOTE) The Xpert Xpress SARS-CoV-2/FLU/RSV plus assay is  intended as an aid in the diagnosis of influenza from Nasopharyngeal swab specimens and should not be used as a sole basis for treatment. Nasal washings and aspirates are unacceptable for Xpert Xpress SARS-CoV-2/FLU/RSV testing.  Fact Sheet for Patients: BloggerCourse.comhttps://www.fda.gov/media/152166/download  Fact Sheet for Healthcare Providers: SeriousBroker.ithttps://www.fda.gov/media/152162/download  This test is not yet approved or cleared by the [place] FDA and has been authorized for detection and/or diagnosis of SARS-CoV-2 by FDA under an Emergency Use Authorization (EUA). This EUA will remain in effect (meaning this test can be used) for the duration of the COVID-19 declaration under Section 564(b)(1) of the Act, 21 U.S.C. section 360bbb-3(b)(1), unless the authorization is terminated or revoked.  Performed at [facility], [address] [place], [place] [id number]     CT Thoracic Spine Wo Contrast  Result Date: 09/05/2021 CLINICAL DATA:  Fall EXAM: CT THORACIC AND LUMBAR SPINE WITHOUT CONTRAST TECHNIQUE: Multidetector CT imaging of the thoracic and lumbar spine was performed without contrast. Multiplanar CT image reconstructions were also generated. RADIATION DOSE REDUCTION: This exam was performed according to the departmental dose-optimization program which includes automated exposure control, adjustment of the mA and/or kV according to patient size and/or use of iterative reconstruction technique. COMPARISON:  07/05/2021 FINDINGS: CT THORACIC SPINE FINDINGS Alignment:  Normal. Vertebrae: Mild compression deformity of T1 is unchanged. Now status post T6 vertebral augmentation without progressive height loss. Chronic compression deformities at T8-T11 are unchanged. No acute compression fracture. Paraspinal and other soft tissues: There is calcific aortic atherosclerosis. Cardiomegaly with bilateral dependent atelectasis. Disc levels: No spinal canal or neural foraminal stenosis. CT LUMBAR SPINE FINDINGS Segmentation: 5 lumbar type vertebrae. Alignment: Grade 1 anterolisthesis at L4-5. Vertebrae: Chronic compression deformity of L1.  No acute fracture. Paraspinal and other soft tissues: Calcific aortic atherosclerosis. Disc levels: No spinal canal stenosis or nerve root impingement. IMPRESSION: 1. No acute fracture of the thoracic or lumbar spine. 2. Status post T6 vertebral augmentation without progressive height loss. 3. Chronic compression deformities at T1, T8-T11 and L1. Aortic Atherosclerosis (ICD10-I70.0). Electronically Signed   By: Deatra RobinsonKevin  Herman M.D.   On: 09/05/2021 02:19   CT Lumbar Spine Wo Contrast  Result Date: 09/05/2021 CLINICAL DATA:  Fall EXAM: CT THORACIC AND LUMBAR SPINE WITHOUT CONTRAST TECHNIQUE: Multidetector CT imaging of the thoracic and lumbar spine was performed without contrast. Multiplanar CT image reconstructions were also generated. RADIATION DOSE REDUCTION: This exam was performed according to the departmental dose-optimization program which includes automated exposure control, adjustment of the mA and/or kV according to patient size and/or use of iterative reconstruction technique. COMPARISON:  07/05/2021 FINDINGS: CT THORACIC SPINE FINDINGS Alignment: Normal. Vertebrae: Mild compression deformity of T1 is unchanged. Now status post T6 vertebral augmentation without progressive height loss. Chronic compression deformities at T8-T11 are unchanged. No acute compression fracture. Paraspinal and other soft tissues: There is calcific aortic  atherosclerosis. Cardiomegaly with bilateral dependent atelectasis. Disc levels: No spinal canal or neural foraminal stenosis. CT LUMBAR SPINE FINDINGS Segmentation: 5 lumbar type vertebrae. Alignment: Grade 1 anterolisthesis at L4-5. Vertebrae: Chronic compression deformity of L1.  No acute fracture. Paraspinal and other soft tissues: Calcific aortic atherosclerosis. Disc levels: No spinal canal stenosis or nerve root impingement. IMPRESSION: 1. No acute fracture of the thoracic or lumbar spine. 2. Status post T6 vertebral augmentation without progressive height loss. 3. Chronic compression deformities at T1, T8-T11 and L1. Aortic Atherosclerosis (ICD10-I70.0). Electronically Signed   By: Deatra RobinsonKevin  Herman M.D.   On: 09/05/2021 02:19   DG Hip  Unilat With Pelvis 2-3 Views Left  Result Date: 09/04/2021 CLINICAL DATA:  Fall.  Left hip pain. EXAM: DG HIP (WITH OR WITHOUT PELVIS) 2-3V LEFT COMPARISON:  None. FINDINGS: Comminuted and angulated intertrochanteric fracture of the left femur. No dislocation. The bones are osteopenic. Age indeterminate fracture of the left inferior pubic ramus. The soft tissues are unremarkable. Vascular calcification noted. IMPRESSION: 1. Comminuted and angulated intertrochanteric fracture of the left femur. 2. Age indeterminate fracture of the left inferior pubic ramus. Electronically Signed   By: Elgie Collard M.D.   On: 09/04/2021 23:38    Review of Systems Blood pressure (!) 143/65, pulse 87, temperature 97.9 F (36.6 C), temperature source Oral, resp. rate 14, last menstrual period 07/07/1997, SpO2 95 %. Physical Exam Vitals reviewed.  Cardiovascular:     Rate and Rhythm: Normal rate.  Pulmonary:     Effort: Pulmonary effort is normal.  Abdominal:     Palpations: Abdomen is soft.  Musculoskeletal:     Left hip: Deformity, tenderness and bony tenderness present. Decreased strength.  Neurological:     General: No focal deficit present.     Mental Status: She is alert.   Psychiatric:        Behavior: Behavior normal.   Her left lower extremity is shortened and externally rotated consistent with a hip fracture.  Her foot on the left side is well-perfused.  She has severe pain with attempts of motion of her left lower extremity.   Assessment/Plan: Left hip intertrochanteric fracture  I talked to the patient and her husband in length.  The plan will be to proceed with surgery this evening for stabilization of her left hip fracture in order to hopefully increase her mobility and degree of her pain and decrease the complications from immobility.  I had a long thorough discussion about the risks and benefits of the surgery.  She is still in the ED and awaiting an inpatient bed.  She should remain n.p.o. in anticipation of surgery later today.  Kathryne Hitch 09/05/2021, 7:03 AM

## 2021-09-05 NOTE — Transfer of Care (Signed)
Immediate Anesthesia Transfer of Care Note ? ?Patient: Natasha Harper ? ?Procedure(s) Performed: orif left hip (Left) ? ?Patient Location: PACU ? ?Anesthesia Type:Spinal ? ?Level of Consciousness: awake, alert  and oriented ? ?Airway & Oxygen Therapy: Patient Spontanous Breathing ? ?Post-op Assessment: Report given to RN and Post -op Vital signs reviewed and stable ? ?Post vital signs: Reviewed and stable ? ?Last Vitals:  ?Vitals Value Taken Time  ?BP    ?Temp 36.5 ?C 09/05/21 1830  ?Pulse 90 09/05/21 1830  ?Resp 12 09/05/21 1834  ?SpO2    ?Vitals shown include unvalidated device data. ? ?Last Pain:  ?Vitals:  ? 09/05/21 1645  ?TempSrc:   ?PainSc: Asleep  ?   ? ?Patients Stated Pain Goal: 3 (09/05/21 1616) ? ?Complications: No notable events documented. ?

## 2021-09-05 NOTE — ED Provider Notes (Signed)
[facility]-EMERGENCY DEPT Provider Note   CSN: [id number] Arrival date & time: 09/04/21  2224     History  Chief Complaint  Patient presents with   Fall   Left Hip Pain    Natasha Harper is a 87 y.o. female.  Natasha Harper is a 87 y.o. female with a history of CAD, hypertension, hyperlipidemia, multiple prior compression fractures, who presents to the ED via EMS for evaluation after a fall.  Just prior to arrival patient was washing up and getting ready for bed in the bathroom when her hands were wet and she slipped tried to grab her walker and fell landing on her bottom and left hip.  Patient was unable to get up after the fall due to severe pain in her hip.  Received 100 mcg of fentanyl with EMS with some improvement in pain.  Denies hitting her head or any loss of consciousness.  Patient on daily aspirin but no other blood thinners.  Reports some pain in her low back, but no pain in the chest or abdomen, no pain in her other extremities.  The history is provided by the patient and the spouse.      Home Medications Prior to Admission medications   Medication Sig Start Date End Date Taking? Authorizing Provider  acetaminophen (TYLENOL) 500 MG tablet Take 1,000 mg by mouth in the morning and at bedtime.   Yes [provider]  aspirin 81 MG tablet Take 81 mg by mouth daily.   Yes [provider]  atorvastatin (LIPITOR) 80 MG tablet TAKE 1 TABLET BY MOUTH DAILY Patient taking differently: Take 80 mg by mouth daily. 10/30/20  Yes Panosh, Neta Mends, MD  fish oil-omega-3 fatty acids 1000 MG capsule Take 1,000 mg by mouth daily.   Yes [provider]  lidocaine (LIDODERM) 5 % Place 1 patch onto the skin daily. Remove & Discard patch within 12 hours or as directed by MD Patient taking differently: Place 1 patch onto the skin daily as needed (pain). Remove & Discard patch within 12 hours or as directed by MD 03/05/21  Yes Panosh, Neta Mends, MD   MAGNESIUM PO Take 1 tablet by mouth daily at 12 noon.   Yes [provider]  Menaquinone-7 (VITAMIN K2 PO) Take 1 tablet by mouth daily. Vitamin D3   Yes [provider]  MULTIPLE VITAMIN PO Take 1 tablet by mouth daily.   Yes [provider]  SYNTHROID 50 MCG tablet TAKE 1 TABLET BY MOUTH DAILY Patient taking differently: Take 50 mcg by mouth daily before breakfast. 10/30/20  Yes Panosh, Neta Mends, MD  amLODipine (NORVASC) 5 MG tablet Take 1 tablet (5 mg total) by mouth daily. Patient not taking: Reported on 09/05/2021 07/24/21   Calvert Cantor, MD  baclofen (LIORESAL) 10 MG tablet Take 0.5-1 tablets (5-10 mg total) by mouth 3 (three) times daily as needed for muscle spasms. Patient not taking: Reported on 04/14/2021 01/28/21   Hilts, Casimiro Needle, MD  folic acid (FOLVITE) 1 MG tablet Take 1 tablet (1 mg total) by mouth daily. Patient not taking: Reported on 07/06/2021 04/16/21   Glade Lloyd, MD  losartan (COZAAR) 50 MG tablet Take 1 tablet (50 mg total) by mouth daily. Patient not taking: Reported on 09/05/2021 07/25/21   Calvert Cantor, MD  polyethylene glycol (MIRALAX / GLYCOLAX) 17 g packet Take 17 g by mouth daily as needed for mild constipation. Can take up to twice a day for constipation. Patient not  taking: Reported on 09/05/2021 07/24/21   Calvert Cantorizwan, Saima, MD  traMADol (ULTRAM) 50 MG tablet Take 1 tablet (50 mg total) by mouth every 6 (six) hours as needed for moderate pain. Patient not taking: Reported on 09/05/2021 07/24/21   Calvert Cantorizwan, Saima, MD      Allergies    Actonel [risedronate sodium], Evista [raloxifene], and Fosamax [alendronate sodium]    Review of Systems   Review of Systems  Constitutional:  Negative for chills and fever.  Respiratory:  Negative for shortness of breath.   Cardiovascular:  Negative for chest pain.  Gastrointestinal:  Negative for abdominal pain.  Genitourinary:  Negative for dysuria.  Musculoskeletal:  Positive for arthralgias and back pain.  Negative for neck pain.  Neurological:  Negative for dizziness, syncope, weakness, light-headedness, numbness and headaches.  All other systems reviewed and are negative.  Physical Exam Updated Vital Signs BP (!) 154/81    Pulse 90    Temp 97.9 F (36.6 C) (Oral)    Resp 17    LMP 07/07/1997    SpO2 95%  Physical Exam Vitals and nursing note reviewed.  Constitutional:      General: She is not in acute distress.    Appearance: Normal appearance. She is well-developed. She is not ill-appearing or diaphoretic.  HENT:     Head: Normocephalic and atraumatic.     Comments: No hematoma, step-off or deformity. Eyes:     General:        Right eye: No discharge.        Left eye: No discharge.  Cardiovascular:     Rate and Rhythm: Normal rate and regular rhythm.     Pulses: Normal pulses.     Heart sounds: Normal heart sounds.  Pulmonary:     Effort: Pulmonary effort is normal. No respiratory distress.     Breath sounds: Normal breath sounds. No wheezing or rales.     Comments: Respirations equal and unlabored, patient able to speak in full sentences, lungs clear to auscultation bilaterally  Chest:     Chest wall: No tenderness.  Abdominal:     General: Bowel sounds are normal. There is no distension.     Palpations: Abdomen is soft. There is no mass.     Tenderness: There is no abdominal tenderness. There is no guarding.     Comments: Abdomen soft, nondistended, nontender to palpation in all quadrants without guarding or peritoneal signs  Musculoskeletal:        General: Tenderness present.     Cervical back: Neck supple. No tenderness.     Comments: Tenderness over the left hip with some shortening, no right hip tenderness.  Pelvis without instability.  Patient some midline lower back tenderness without palpable step-off or deformity.  All other joints supple and easily movable, all compartments soft.  Skin:    General: Skin is warm and dry.     Capillary Refill: Capillary refill  takes less than 2 seconds.  Neurological:     Mental Status: She is alert and oriented to person, place, and time.     Coordination: Coordination normal.     Comments: Speech is clear, able to follow commands CN III-XII intact Normal strength in upper and lower extremities bilaterally including dorsiflexion and plantar flexion, strong and equal grip strength Sensation normal to light and sharp touch Moves extremities without ataxia, coordination intact  Psychiatric:        Mood and Affect: Mood normal.        Behavior:  Behavior normal.    ED Results / Procedures / Treatments   Labs (all labs ordered are listed, but only abnormal results are displayed) Labs Reviewed  BASIC METABOLIC PANEL - Abnormal; Notable for the following components:      Result Value   Sodium 130 (*)    Glucose, Bld 114 (*)    Creatinine, Ser 0.30 (*)    All other components within normal limits  CBC WITH DIFFERENTIAL/PLATELET - Abnormal; Notable for the following components:   RBC 2.92 (*)    Hemoglobin 10.0 (*)    HCT 30.3 (*)    MCV 103.8 (*)    MCH 34.2 (*)    All other components within normal limits  RESP PANEL BY RT-PCR (FLU A&B, COVID) [id number]    EKG EKG Interpretation  Date/Time:  Thursday September 05 2021 00:21:40 EST Ventricular Rate:  96 PR Interval:  148 QRS Duration: 95 QT Interval:  346 QTC Calculation: 438 R Axis:   52 Text Interpretation: Sinus rhythm Ventricular premature complex Probable left atrial enlargement Borderline repolarization abnormality Confirmed by Tilden Fossaees, Elizabeth [phone]([id number]) on 09/05/2021 12:25:26 AM  Radiology CT Thoracic Spine Wo Contrast  Result Date: 09/05/2021 CLINICAL DATA:  Fall EXAM: CT THORACIC AND LUMBAR SPINE WITHOUT CONTRAST TECHNIQUE: Multidetector CT imaging of the thoracic and lumbar spine was performed without contrast. Multiplanar CT image reconstructions were also generated. RADIATION DOSE REDUCTION: This exam was performed according to the departmental  dose-optimization program which includes automated exposure control, adjustment of the mA and/or kV according to patient size and/or use of iterative reconstruction technique. COMPARISON:  07/05/2021 FINDINGS: CT THORACIC SPINE FINDINGS Alignment: Normal. Vertebrae: Mild compression deformity of T1 is unchanged. Now status post T6 vertebral augmentation without progressive height loss. Chronic compression deformities at T8-T11 are unchanged. No acute compression fracture. Paraspinal and other soft tissues: There is calcific aortic atherosclerosis. Cardiomegaly with bilateral dependent atelectasis. Disc levels: No spinal canal or neural foraminal stenosis. CT LUMBAR SPINE FINDINGS Segmentation: 5 lumbar type vertebrae. Alignment: Grade 1 anterolisthesis at L4-5. Vertebrae: Chronic compression deformity of L1.  No acute fracture. Paraspinal and other soft tissues: Calcific aortic atherosclerosis. Disc levels: No spinal canal stenosis or nerve root impingement. IMPRESSION: 1. No acute fracture of the thoracic or lumbar spine. 2. Status post T6 vertebral augmentation without progressive height loss. 3. Chronic compression deformities at T1, T8-T11 and L1. Aortic Atherosclerosis (ICD10-I70.0). Electronically Signed   By: Deatra RobinsonKevin  Herman M.D.   On: 09/05/2021 02:19   CT Lumbar Spine Wo Contrast  Result Date: 09/05/2021 CLINICAL DATA:  Fall EXAM: CT THORACIC AND LUMBAR SPINE WITHOUT CONTRAST TECHNIQUE: Multidetector CT imaging of the thoracic and lumbar spine was performed without contrast. Multiplanar CT image reconstructions were also generated. RADIATION DOSE REDUCTION: This exam was performed according to the departmental dose-optimization program which includes automated exposure control, adjustment of the mA and/or kV according to patient size and/or use of iterative reconstruction technique. COMPARISON:  07/05/2021 FINDINGS: CT THORACIC SPINE FINDINGS Alignment: Normal. Vertebrae: Mild compression deformity of T1  is unchanged. Now status post T6 vertebral augmentation without progressive height loss. Chronic compression deformities at T8-T11 are unchanged. No acute compression fracture. Paraspinal and other soft tissues: There is calcific aortic atherosclerosis. Cardiomegaly with bilateral dependent atelectasis. Disc levels: No spinal canal or neural foraminal stenosis. CT LUMBAR SPINE FINDINGS Segmentation: 5 lumbar type vertebrae. Alignment: Grade 1 anterolisthesis at L4-5. Vertebrae: Chronic compression deformity of L1.  No acute fracture. Paraspinal and other soft tissues: Calcific aortic atherosclerosis. Disc  levels: No spinal canal stenosis or nerve root impingement. IMPRESSION: 1. No acute fracture of the thoracic or lumbar spine. 2. Status post T6 vertebral augmentation without progressive height loss. 3. Chronic compression deformities at T1, T8-T11 and L1. Aortic Atherosclerosis (ICD10-I70.0). Electronically Signed   By: Deatra Robinson M.D.   On: 09/05/2021 02:19   DG Hip Unilat With Pelvis 2-3 Views Left  Result Date: 09/04/2021 CLINICAL DATA:  Fall.  Left hip pain. EXAM: DG HIP (WITH OR WITHOUT PELVIS) 2-3V LEFT COMPARISON:  None. FINDINGS: Comminuted and angulated intertrochanteric fracture of the left femur. No dislocation. The bones are osteopenic. Age indeterminate fracture of the left inferior pubic ramus. The soft tissues are unremarkable. Vascular calcification noted. IMPRESSION: 1. Comminuted and angulated intertrochanteric fracture of the left femur. 2. Age indeterminate fracture of the left inferior pubic ramus. Electronically Signed   By: Elgie Collard M.D.   On: 09/04/2021 23:38    Procedures Procedures    Medications Ordered in ED Medications  morphine (PF) 2 MG/ML injection 1 mg (has no administration in time range)  fentaNYL (SUBLIMAZE) injection 50 mcg (50 mcg Intravenous Given 09/05/21 0106)  morphine (PF) 4 MG/ML injection 4 mg (4 mg Intravenous Given 09/05/21 0232)  ondansetron  (ZOFRAN) injection 4 mg (4 mg Intravenous Given 09/05/21 0232)    ED Course/ Medical Decision Making/ A&P                           This patient presents to the ED for concern of fall, hip pain, this involves an extensive number of treatment options, and is a complaint that carries with it a high risk of complications and morbidity.  The differential diagnosis includes hip fracture, pelvic fracture, hip dislocation, spinal compression fracture.     Co morbidities that complicate the patient evaluation  CAD, hypertension, hyperlipidemia, osteoporosis.   Additional history obtained:  Additional history obtained from spouse at bedside External records from outside source obtained and reviewed including prior ED visits and hospital admissions   Lab Tests:  I Ordered, and personally interpreted labs.  The pertinent results include: No leukocytosis, stable hemoglobin, mild hyponatremia of 130, no other significant electrolyte derangements, negative COVID and flu   Imaging Studies ordered:  I ordered imaging studies including left hip x-ray as well as CTs of the thoracic and lumbar spine.  Negative for I independently visualized and interpreted imaging which showed left hip fracture, CTs with prior compression fractures but no new fractures I agree with the radiologist interpretation   Cardiac Monitoring:  The patient was maintained on a cardiac monitor.  I personally viewed and interpreted the cardiac monitored which showed an underlying rhythm of: NSR   Medicines ordered and prescription drug management:  I ordered medication including IV morphine for pain after completing Reevaluation of the patient after these medicines showed that the patient improved I have reviewed the patients home medicines and have made adjustments as needed    Consultations Obtained:  I requested consultation with orthopedics,  and discussed lab and imaging findings as well as pertinent plan - Dr.  Magnus Ivan agrees with plan for medicine admission and will see pt in morning for evaluation for surgery Consulted hospitalist service for admission, discussed labs, imaging findings and plan with Dr. Loney Loh, who will see and recommended patient.    Problem List / ED Course:  Patient with comminuted intertrochanteric left hip fracture after mechanical fall.  Will require hospital admission for surgical  repair.         Final Clinical Impression(s) / ED Diagnoses Final diagnoses:  Closed fracture of left hip, initial encounter [facility])  Fall, initial encounter    Rx / DC Orders ED Discharge Orders     None         Legrand Rams 09/05/21 0547    Tilden Fossa, MD 09/06/21 [phone]

## 2021-09-05 NOTE — ED Notes (Signed)
PureWick placed on patient. 

## 2021-09-05 NOTE — Anesthesia Preprocedure Evaluation (Signed)
Anesthesia Evaluation  ?Patient identified by MRN, date of birth, ID band ?Patient awake ? ? ? ?Reviewed: ?Allergy & Precautions, NPO status , Patient's Chart, lab work & pertinent test results, reviewed documented beta blocker date and time  ? ?Airway ?Mallampati: I ? ?TM Distance: >3 FB ?Neck ROM: Full ? ? ? Dental ?no notable dental hx. ?(+) Dental Advisory Given ?  ?Pulmonary ?former smoker,  ?  ?Pulmonary exam normal ?breath sounds clear to auscultation ? ? ? ? ? ? Cardiovascular ?hypertension, Pt. on medications ?+ CAD and + Peripheral Vascular Disease  ?Normal cardiovascular exam ?Rhythm:Regular Rate:Normal ? ?Bilateral carotid artery stenosis ? ?CAD-PCI to RCA and diagonal in remote past, residual 70% LAD  /   nuclear, 2007, no ischemia ? ?Echo 07/05/21 ?1. Left ventricular ejection fraction, by estimation, is 55 to 60%. The left ventricle has normal function. The left ventricle has no regional wall motion abnormalities. There is mild concentric left ventricular hypertrophy. Left ventricular diastolic parameters are consistent with Grade I diastolic dysfunction (impaired relaxation).  ??2. Right ventricular systolic function is normal. The right ventricular size is normal. There is normal pulmonary artery systolic pressure. The estimated right ventricular systolic pressure is 0000000 mmHg.  ??3. A small pericardial effusion is present. The pericardial effusion is  ?posterior to the left ventricle.  ??4. The mitral valve is grossly normal. Mild mitral valve regurgitation. No evidence of mitral stenosis.  ??5. The aortic valve is tricuspid. There is mild calcification of the aortic valve. There is mild thickening of the aortic valve. Aortic valve regurgitation is not visualized. Aortic valve sclerosis is present, with no evidence of aortic valve stenosis.  ??6. The inferior vena cava is normal in size with greater than 50% respiratory variability, suggesting right atrial pressure  of 3 mmHg.  ? ?EKG 09/05/21 ?NSR, probable LAE ?  ?Neuro/Psych ?HOH ?negative psych ROS  ? GI/Hepatic ?negative GI ROS, Neg liver ROS,   ?Endo/Other  ?Hypothyroidism Hyperlipidemia ?Osteoporosis ? Renal/GU ?negative Renal ROS  ?negative genitourinary ?  ?Musculoskeletal ?Hx/o Compression Fx's vertebral bodies Thoracic and Lumbar spine ? ?Fx left hip  ? Abdominal ?  ?Peds ? Hematology ? ?(+) Blood dyscrasia, anemia ,   ?Anesthesia Other Findings ? ? Reproductive/Obstetrics ? ?  ? ? ? ? ? ? ? ? ? ? ? ? ? ?  ?  ? ? ? ? ? ? ? ? ?Anesthesia Physical ?Anesthesia Plan ? ?ASA: 3 and emergent ? ?Anesthesia Plan: Spinal  ? ?Post-op Pain Management:   ? ?Induction: Intravenous ? ?PONV Risk Score and Plan: 3 and Propofol infusion and Treatment may vary due to age or medical condition ? ?Airway Management Planned: Natural Airway, Simple Face Mask and Nasal Cannula ? ?Additional Equipment:  ? ?Intra-op Plan:  ? ?Post-operative Plan:  ? ?Informed Consent: I have reviewed the patients History and Physical, chart, labs and discussed the procedure including the risks, benefits and alternatives for the proposed anesthesia with the patient or authorized representative who has indicated his/her understanding and acceptance.  ? ? ? ?Dental advisory given ? ?Plan Discussed with: CRNA and Anesthesiologist ? ?Anesthesia Plan Comments:   ? ? ? ? ? ? ?Anesthesia Quick Evaluation ? ?

## 2021-09-05 NOTE — H&P (Signed)
?History and Physical  ? ? ?Patient: Natasha Harper [id number] DOB: February 07, 1935 ?DOA: 09/04/2021 ?DOS: the patient was seen and examined on 09/05/2021 ?PCP: [name], MD  ?Patient coming from: Home ? ?Chief Complaint:  ?Chief Complaint  ?Patient presents with  ? Fall  ? Left Hip Pain  ? ? ?HPI: Natasha Harper is a 87 y.o. female with medical history significant of HTN, HLD, CAD, hypothyroidism. Presenting with left hip pain after a fall. She fell last night around 9p. She says she had slippery hands. She was tried to grab her walker but it spun around from under her. She fell straight down onto her tail bone. There was no head injury or LOC. She didn't have any CP, palpitations, or dizziness prior to the fall. She was unable to get up on her own. She called for EMS. They brought her to the ED for evaluation. She denies any other aggravating or alleviating factors.   ? ?Review of Systems: As mentioned in the history of present illness. All other systems reviewed and are negative. ?Past Medical History:  ?Diagnosis Date  ? CAD (coronary artery disease)   ? PCI to RCA and diagonal in remote past, residual 70% LAD  /   nuclear, 2007, no ischemia  ? Carotid arterial disease (HCC)   ? Doppler, June, 2011, stable, 60-79% R. ICA, 40-59% LICA  ? Contact lens/glasses fitting   ? wears contacts or glasses  ? Dyslipidemia   ? Significant drop in LDL from Lipitor even though LDL remains high  ? Ejection fraction   ? EF 65%, nuclear, 2007  ? Fibrocystic breast   ? HTN (hypertension)   ?  no med  ? Hypercholesterolemia   ? Hypothyroidism   ? Thyroid surgery in the past, thyroid nodules followed by Dr.Ellison  ? Lump or mass in breast 07/19/2007  ? Excised 01/12/13. B9 on pathQualifier: Diagnosis of  By: Fabian Sharp MD, Neta Mends    ? Osteoporosis   ? Prominent abdominal aortic pulsation   ? No abdominal aneurysm by ultrasound  ? Rectal fissure   ? ?Past Surgical History:  ?Procedure Laterality Date  ? BREAST BIOPSY Right 01/12/2013  ?  Procedure: Removal of right breast mass;  Surgeon: Currie Paris, MD;  Location: [facility];  Service: General;  Laterality: Right;  ? BREAST EXCISIONAL BIOPSY  9/14  ? scar tissue from the needle biopsy  ? CORONARY ANGIOPLASTY  1993  ? IR KYPHO THORACIC WITH BONE BIOPSY  07/11/2021  ? RECTOPERITONEAL FISTULA CLOSURE    ? THYROID CYST EXCISION    ? ?Social History:  reports that she quit smoking about 63 years ago. She has never used smokeless tobacco. She reports that she does not drink alcohol and does not use drugs. ? ?Allergies  ?Allergen Reactions  ? Actonel [Risedronate Sodium]   ?  bloating  ? Evista [Raloxifene]   ?  Abdominal cramps  ? Fosamax [Alendronate Sodium]   ?  Abdominal cramps  ? ? ?Family History  ?Problem Relation Age of Onset  ? Heart attack Father   ? Osteoporosis Mother   ? Breast cancer Daughter   ? ? ?Prior to Admission medications   ?Medication Sig Start Date End Date Taking? Authorizing Provider  ?acetaminophen (TYLENOL) 500 MG tablet Take 1,000 mg by mouth in the morning and at bedtime.   Yes [provider]  ?aspirin 81 MG tablet Take 81 mg by mouth daily.   Yes [provider]  ?atorvastatin (LIPITOR) 80 MG tablet TAKE 1 TABLET BY MOUTH DAILY ?Patient taking differently: Take 80 mg by mouth daily. 10/30/20  Yes Panosh, Neta Mends, MD  ?fish oil-omega-3 fatty acids 1000 MG capsule Take 1,000 mg by mouth daily.   Yes [provider]  ?lidocaine (LIDODERM) 5 % Place 1 patch onto the skin daily. Remove & Discard patch within 12 hours or as directed by MD ?Patient taking differently: Place 1 patch onto the skin daily as needed (pain). Remove & Discard patch within 12 hours or as directed by MD 03/05/21  Yes Panosh, Neta Mends, MD  ?MAGNESIUM PO Take 1 tablet by mouth daily at 12 noon.   Yes [provider]  ?Menaquinone-7 (VITAMIN K2 PO) Take 1 tablet by mouth daily. Vitamin D3   Yes [provider]  ?MULTIPLE VITAMIN PO Take 1 tablet by  mouth daily.   Yes [provider]  ?SYNTHROID 50 MCG tablet TAKE 1 TABLET BY MOUTH DAILY ?Patient taking differently: Take 50 mcg by mouth daily before breakfast. 10/30/20  Yes Panosh, Neta Mends, MD  ?amLODipine (NORVASC) 5 MG tablet Take 1 tablet (5 mg total) by mouth daily. ?Patient not taking: Reported on 09/05/2021 07/24/21   Calvert Cantor, MD  ?baclofen (LIORESAL) 10 MG tablet Take 0.5-1 tablets (5-10 mg total) by mouth 3 (three) times daily as needed for muscle spasms. ?Patient not taking: Reported on 04/14/2021 01/28/21   Hilts, Casimiro Needle, MD  ?folic acid (FOLVITE) 1 MG tablet Take 1 tablet (1 mg total) by mouth daily. ?Patient not taking: Reported on 07/06/2021 04/16/21   Glade Lloyd, MD  ?losartan (COZAAR) 50 MG tablet Take 1 tablet (50 mg total) by mouth daily. ?Patient not taking: Reported on 09/05/2021 07/25/21   Calvert Cantor, MD  ?polyethylene glycol (MIRALAX / GLYCOLAX) 17 g packet Take 17 g by mouth daily as needed for mild constipation. Can take up to twice a day for constipation. ?Patient not taking: Reported on 09/05/2021 07/24/21   Calvert Cantor, MD  ?traMADol (ULTRAM) 50 MG tablet Take 1 tablet (50 mg total) by mouth every 6 (six) hours as needed for moderate pain. ?Patient not taking: Reported on 09/05/2021 07/24/21   Calvert Cantor, MD  ? ? ?Physical Exam: ?Vitals:  ? 09/05/21 0100 09/05/21 0130 09/05/21 0230 09/05/21 0600  ?BP: (!) 173/88 (!) 173/81 (!) 154/81 (!) 143/65  ?Pulse: 100 98 90 87  ?Resp: 17 19 17 14   ?Temp:      ?TempSrc:      ?SpO2: 94% 95% 95% 95%  ? ?General: 87 y.o. female resting in bed in NAD ?Eyes: PERRL, normal sclera ?ENMT: Nares patent w/o discharge, orophaynx clear, dentition normal, ears w/o discharge/lesions/ulcers ?Neck: Supple, trachea midline ?Cardiovascular: RRR, +S1, S2, no m/g/r, equal pulses throughout ?Respiratory: CTABL, no w/r/r, normal WOB ?GI: BS+, NDNT, no masses noted, no organomegaly noted ?MSK: No e/c/c; limited ROM of left hip d/t pain ?Neuro: A&O x 3, no  focal deficits ?Psyc: Appropriate interaction and affect, calm/cooperative ? ? ?Data Reviewed: ? ?Na+ 130 ?Hgb 10.0 ? ?XR left hip: 1. Comminuted and angulated intertrochanteric fracture of the left femur. 2. Age indeterminate fracture of the left inferior pubic ramus. ? ?Assessment and Plan: ?No notes have been filed under this hospital service. ?Service: Hospitalist ?Left femur fracture ?Fall ?    - admit to inpt, med-surg ?    - Orthopedics to take for surgery (Dr. Rayburn Ma); appreciate assistance ?    - PT/OT after surgery ?    -  TOC consult ?    - NPO for now ? ?HTN ?    - PRN HTN meds until off NPO status ? ?Hypothyroidism ?    - resume home regimen when off NPO status ? ?CAD ?    - resume home regimen when off NPO status ? ?HLD ?    - resume home statin when off NPO status ? ?Chronic hyponatremia ?    - she will have some fluids prior to surgery ? ?Advance Care Planning:   Code Status: FULL ? ?Consults: Orthopedics (Dr. Rayburn Ma) ? ?Family Communication: w/ husband at bedside ? ?Severity of Illness: ?The appropriate patient status for this patient is INPATIENT. Inpatient status is judged to be reasonable and necessary in order to provide the required intensity of service to ensure the patient's safety. The patient's presenting symptoms, physical exam findings, and initial radiographic and laboratory data in the context of their chronic comorbidities is felt to place them at high risk for further clinical deterioration. Furthermore, it is not anticipated that the patient will be medically stable for discharge from the hospital within 2 midnights of admission.  ? ?* I certify that at the point of admission it is my clinical judgment that the patient will require inpatient hospital care spanning beyond 2 midnights from the point of admission due to high intensity of service, high risk for further deterioration and high frequency of surveillance required.* ? ?Author: ?Teddy Spike, DO ?09/05/2021 7:21 AM ? ?For on  call review [email or web address].  ?

## 2021-09-05 NOTE — TOC Progression Note (Signed)
Transition of Care (TOC) - Progression Note  ? ? ?Patient Details  ?Name: Natasha Harper ?MRN: [id number] ?Date of Birth: 01-11-1935 ? ?Transition of Care (TOC) CM/SW Contact  ?Geni Bers, RN ?Phone Number: ?09/05/2021, 2:29 PM ? ?Clinical Narrative:    ? ? ?Transition of Care (TOC) Screening Note ? ? ?Patient Details  ?Name: Natasha Harper ?Date of Birth: 1935-05-02 ? ? ?Transition of Care (TOC) CM/SW Contact:    ?Geni Bers, RN ?Phone Number: ?09/05/2021, 2:30 PM ? ? ? ?Transition of Care Department [facility]) has reviewed patient and no TOC needs have been identified at this time. We will continue to monitor patient advancement through interdisciplinary progression rounds. If new patient transition needs arise, please place a TOC consult. ?  ? ?  ?  ? ?Expected Discharge Plan and Services ?  ?  ?  ?  ?  ?                ?  ?  ?  ?  ?  ?  ?  ?  ?  ?  ? ? ?Social Determinants of Health (SDOH) Interventions ?  ? ?Readmission Risk Interventions ?Readmission Risk Prevention Plan 07/17/2021  ?Transportation Screening Complete  ?PCP or Specialist Appt within 3-5 Days Not Complete  ?Not Complete comments Plan to d/c to [facility] Inpatient rehab  ?HRI or Home Care Consult Not Complete  ?HRI or Home Care Consult comments Patient to d/c to [facility] Inpatient rehab  ?Social Work Consult for Recovery Care Planning/Counseling Complete  ?Palliative Care Screening Not Applicable  ?Medication Review Oceanographer) Referral to Pharmacy  ?Some recent data might be hidden  ? ? ?

## 2021-09-05 NOTE — Anesthesia Procedure Notes (Signed)
Spinal ? ?Patient location during procedure: OR ?Start time: 09/05/2021 5:15 PM ?End time: 09/05/2021 5:22 PM ?Reason for block: surgical anesthesia ?Staffing ?Performed: anesthesiologist  ?Anesthesiologist: Mal Amabile, MD ?Preanesthetic Checklist ?Completed: patient identified, IV checked, site marked, risks and benefits discussed, surgical consent, monitors and equipment checked, pre-op evaluation and timeout performed ?Spinal Block ?Patient position: left lateral decubitus ?Prep: DuraPrep and site prepped and draped ?Patient monitoring: heart rate, cardiac monitor, continuous pulse ox and blood pressure ?Approach: midline ?Location: L3-4 ?Injection technique: single-shot ?Needle ?Needle type: Pencan  ?Needle gauge: 24 G ?Needle length: 9 cm ?Needle insertion depth: 6 cm ?Assessment ?Sensory level: T4 ?Events: CSF return ?Additional Notes ?Patient tolerated procedure well. Adequate sensory level. Multiple attempts. Difficult due to position. ? ? ? ? ? ?

## 2021-09-05 NOTE — Anesthesia Postprocedure Evaluation (Signed)
Anesthesia Post Note ? ?Patient: DAKIA SCHIFANO ? ?Procedure(s) Performed: orif left hip (Left) ? ?  ? ?Patient location during evaluation: PACU ?Anesthesia Type: Spinal ?Level of consciousness: oriented and awake and alert ?Pain management: pain level controlled ?Vital Signs Assessment: post-procedure vital signs reviewed and stable ?Respiratory status: spontaneous breathing, respiratory function stable, patient connected to nasal cannula oxygen and nonlabored ventilation ?Cardiovascular status: blood pressure returned to baseline and stable ?Postop Assessment: no headache, no backache, no apparent nausea or vomiting and spinal receding ?Anesthetic complications: no ? ? ?No notable events documented. ? ?Last Vitals:  ?Vitals:  ? 09/05/21 1850 09/05/21 1900  ?BP:  130/67  ?Pulse:  82  ?Resp:  11  ?Temp:    ?SpO2: 100% 100%  ?  ?Last Pain:  ?Vitals:  ? 09/05/21 1900  ?TempSrc:   ?PainSc: 0-No pain  ? ? ?  ?  ?  ?  ?  ?  ? ?Yadier Bramhall A. ? ? ? ? ?

## 2021-09-05 NOTE — Brief Op Note (Signed)
09/05/2021 ? ?6:09 PM ? ?PATIENT:  Natasha Harper  87 y.o. female ? ?PRE-OPERATIVE DIAGNOSIS:  left hip fracture ? ?POST-OPERATIVE DIAGNOSIS:  left hip fracture ? ?PROCEDURE:  Procedure(s) with comments: ?orif left hip (Left) - fracture table, carm, shower curtain,smith and nephew ? ?SURGEON:  Surgeon(s) and Role: ?   Mcarthur Rossetti, MD - Primary ? ?PHYSICIAN ASSISTANT:  Benita Stabile, PA-C ? ?ANESTHESIA:   spinal ? ?EBL:  112 mL  ? ?COUNTS:  YES ? ?DICTATION: .Other Dictation: Dictation Number [id number] ? ?PLAN OF CARE: Admit to inpatient  ? ?PATIENT DISPOSITION:  PACU - hemodynamically stable. ?  ?Delay start of Pharmacological VTE agent (>24hrs) due to surgical blood loss or risk of bleeding: no ? ?

## 2021-09-06 ENCOUNTER — Encounter (HOSPITAL_COMMUNITY): Payer: Self-pay | Admitting: Orthopaedic Surgery

## 2021-09-06 DIAGNOSIS — I10 Essential (primary) hypertension: Secondary | ICD-10-CM

## 2021-09-06 DIAGNOSIS — E039 Hypothyroidism, unspecified: Secondary | ICD-10-CM

## 2021-09-06 DIAGNOSIS — E785 Hyperlipidemia, unspecified: Secondary | ICD-10-CM

## 2021-09-06 DIAGNOSIS — E871 Hypo-osmolality and hyponatremia: Secondary | ICD-10-CM

## 2021-09-06 LAB — BASIC METABOLIC PANEL
Anion gap: 7 (ref 5–15)
BUN: 16 mg/dL (ref 8–23)
CO2: 25 mmol/L (ref 22–32)
Calcium: 8.4 mg/dL — ABNORMAL LOW (ref 8.9–10.3)
Chloride: 94 mmol/L — ABNORMAL LOW (ref 98–111)
Creatinine, Ser: 0.43 mg/dL — ABNORMAL LOW (ref 0.44–1.00)
GFR, Estimated: >60 mL/min (ref >60)
Glucose, Bld: 110 mg/dL — ABNORMAL HIGH (ref 70–99)
Potassium: 4.1 mmol/L (ref 3.5–5.1)
Sodium: 126 mmol/L — ABNORMAL LOW (ref 135–145)

## 2021-09-06 LAB — CBC
HCT: 24.3 % — ABNORMAL LOW (ref 36.0–46.0)
Hemoglobin: 8.1 g/dL — ABNORMAL LOW (ref 12.0–15.0)
MCH: 34.8 pg — ABNORMAL HIGH (ref 26.0–34.0)
MCHC: 33.3 g/dL (ref 30.0–36.0)
MCV: 104.3 fL — ABNORMAL HIGH (ref 80.0–100.0)
Platelets: 191 10*3/uL (ref 150–400)
RBC: 2.33 MIL/uL — ABNORMAL LOW (ref 3.87–5.11)
RDW: 14.4 % (ref 11.5–15.5)
WBC: 6.4 10*3/uL (ref 4.0–10.5)
nRBC: 0 % (ref 0.0–0.2)

## 2021-09-06 LAB — SODIUM, URINE, RANDOM: Sodium, Ur: <10 mmol/L

## 2021-09-06 LAB — OSMOLALITY, URINE: Osmolality, Ur: 204 mOsm/kg — ABNORMAL LOW (ref 300–900)

## 2021-09-06 MED ORDER — AMLODIPINE BESYLATE 5 MG PO TABS
5.0000 mg | ORAL_TABLET | Freq: Every day | ORAL | Status: DC
  Administered 2021-09-07 – 2021-09-10 (×4): 5 mg via ORAL
  Filled 2021-09-06 (×4): qty 1

## 2021-09-06 MED ORDER — ADULT MULTIVITAMIN W/MINERALS CH
1.0000 | ORAL_TABLET | Freq: Every day | ORAL | Status: DC
  Administered 2021-09-07 – 2021-09-10 (×4): 1 via ORAL
  Filled 2021-09-06 (×4): qty 1

## 2021-09-06 MED ORDER — SODIUM CHLORIDE 0.9 % IV SOLN: INTRAVENOUS | Status: DC

## 2021-09-06 MED ORDER — ENSURE ENLIVE PO LIQD
237.0000 mL | Freq: Two times a day (BID) | ORAL | Status: DC
  Administered 2021-09-06 – 2021-09-10 (×3): 237 mL via ORAL

## 2021-09-06 MED ORDER — CHLORHEXIDINE GLUCONATE CLOTH 2 % EX PADS
6.0000 | MEDICATED_PAD | Freq: Every day | CUTANEOUS | Status: DC
  Administered 2021-09-07 – 2021-09-10 (×3): 6 via TOPICAL

## 2021-09-06 NOTE — Assessment & Plan Note (Addendum)
-  Continue Lipitor °

## 2021-09-06 NOTE — Assessment & Plan Note (Addendum)
Patient is prescribed amlodipine (listed as not taking) and losartan (listed as not taking) as an outpatient which were held on admission. Uncontrolled blood pressure. Continue home regimen on discharge. ?

## 2021-09-06 NOTE — Assessment & Plan Note (Addendum)
Acute blood loss anemia on chronic anemia. Acute from fracture and surgery. Hemoglobin down to 6.9 on CBC on 3/5. 1 unit of PRBC ordered and transfused. Post-transfusion hemoglobin of 9.3 > 9.2 > 8.8. No obvious evidence of acute hemorrhage noted. Repeat CBC in 3-5 days ?

## 2021-09-06 NOTE — Evaluation (Signed)
Physical Therapy Evaluation ?Patient Details ?Name: Natasha Harper ?MRN: [id number] ?DOB: 12/04/1934 ?Today's Date: 09/06/2021 ? ?History of Present Illness ? 87 y.o. admitted with a fall, Dx of complex  L IT hip fx, s/p ORIF on 09/05/21. PMH includes recent admission 07/04/21 for falls, T1 & T6 compression fractures, s/p KP for T6 (T1 unsuccessful) 07/11/21.  ?Clinical Impression ? Pt admitted with above diagnosis. Pt had pain medication prior to PT Eval. She was unable to tolerate any movement 2* severe pain in L hip and thoracic spine at site of recent compression fractures.  Attempted very gentle PROM of L hip, pt unable to tolerate. She declined trial of supine to sit with +2 assist from OT and use of morphine for pain control. Risks of immobility were explained to pt. She stated she's in too much pain to attempt any movement today. Pt currently with functional limitations due to the deficits listed below (see PT Problem List). Pt will benefit from skilled PT to increase their independence and safety with mobility to allow discharge to the venue listed below.   ?   ?   ? ?Recommendations for follow up therapy are one component of a multi-disciplinary discharge planning process, led by the attending physician.  Recommendations may be updated based on patient status, additional functional criteria and insurance authorization. ? ?Follow Up Recommendations Skilled nursing-short term rehab (<3 hours/day) ? ?  ?Assistance Recommended at Discharge Frequent or constant Supervision/Assistance  ?Patient can return home with the following ? Assist for transportation;Help with stairs or ramp for entrance;A lot of help with bathing/dressing/bathroom;A lot of help with walking and/or transfers;Assistance with cooking/housework ? ?  ?Equipment Recommendations None recommended by PT  ?Recommendations for Other Services ?    ?  ?Functional Status Assessment Patient has had a recent decline in their functional status and demonstrates  the ability to make significant improvements in function in a reasonable and predictable amount of time.  ? ?  ?Precautions / Restrictions Precautions ?Precautions: Fall ?Precaution Comments: multiple falls in past 6 months (spouse reports 4 hospitalizations for falls in 7 months); admission 07/04/21 for falls/compression fxs ?Restrictions ?Weight Bearing Restrictions: No  ? ?  ? ?Mobility ? Bed Mobility ?  ?  ?  ?  ?  ?  ?  ?General bed mobility comments: pt refused 2* pain ?  ? ?Transfers ?  ?  ?  ?  ?  ?  ?  ?  ?  ?General transfer comment: pt refused 2* pain ?  ? ?Ambulation/Gait ?  ?  ?  ?  ?  ?  ?  ?  ? ?Stairs ?  ?  ?  ?  ?  ? ?Wheelchair Mobility ?  ? ?Modified Rankin (Stroke Patients Only) ?  ? ?  ? ?Balance   ?  ?  ?  ?  ?  ?  ?  ?  ?  ?  ?  ?  ?  ?  ?  ?  ?  ?  ?   ? ? ? ?Pertinent Vitals/Pain Pain Assessment ?Pain Assessment: Faces ?Faces Pain Scale: Hurts worst ?Pain Location: L hip and back from recent compression fxs ?Pain Descriptors / Indicators: Grimacing, Guarding ?Pain Intervention(s): Limited activity within patient's tolerance, Monitored during session, Premedicated before session  ? ? ?Home Living Family/patient expects to be discharged to:: Private residence ?Living Arrangements: Spouse/significant other ?Available Help at Discharge: Family;Available 24 hours/day ?Type of Home: House ?Home Access: Level entry ?  ?  ?  ?  Home Layout: Two level;Able to live on main level with bedroom/bathroom ?Home Equipment: Conservation officer, nature (2 wheels);Shower seat;Hand held shower head ?Additional Comments: spouse has a hernia and a "bad knee" so can provide only limited assistance  ?  ?Prior Function Prior Level of Function : Needs assist ?  ?  ?  ?  ?  ?  ?Mobility Comments: pt requiring supervision for mobility, uses RW for short distances (limited by pain) ?ADLs Comments: pt's spouse reports pt needs assist with wash up, per pt would like to be as independent as possible ?  ? ? ?Hand Dominance  ? Dominant  Hand: Right ? ?  ?Extremity/Trunk Assessment  ? Upper Extremity Assessment ?Upper Extremity Assessment: Defer to OT evaluation ?  ? ?Lower Extremity Assessment ?Lower Extremity Assessment: LLE deficits/detail ?LLE Deficits / Details: screamed in pain with very gentle heel slide to ~15* knee flexion, pt reported thoracic pain (from compression fxs)  with any LE movement, could not tolerate further movement of LLE, able to pump ankles independently without pain, pt premedicated with 2 percocet ?LLE: Unable to fully assess due to pain ?LLE Sensation: WNL ?  ? ?   ?Communication  ? Communication: HOH  ?Cognition Arousal/Alertness: Awake/alert ?Behavior During Therapy: [facility] for tasks assessed/performed, Anxious ?Overall Cognitive Status: Within Functional Limits for tasks assessed ?  ?  ?  ?  ?  ?  ?  ?  ?  ?  ?  ?  ?  ?  ?  ?  ?  ?  ?  ? ?  ?General Comments   ? ?  ?Exercises General Exercises - Lower Extremity ?Ankle Circles/Pumps: AROM, Both, 10 reps, Supine ?Heel Slides: Left, Supine, Limitations, PROM ?Heel Slides Limitations: x 1 repetition, limited by pain, only flexed knee to ~15*  ? ?Assessment/Plan  ?  ?PT Assessment Patient needs continued PT services  ?PT Problem List Decreased strength;Decreased mobility;Decreased activity tolerance;Pain ? ?   ?  ?PT Treatment Interventions Gait training;Therapeutic activities;Therapeutic exercise;Functional mobility training;Patient/family education;Wheelchair mobility training   ? ?PT Goals (Current goals can be found in the Care Plan section)  ?Acute Rehab PT Goals ?Patient Stated Goal: rehab ?PT Goal Formulation: With patient/family ?Time For Goal Achievement: 09/20/21 ?Potential to Achieve Goals: Fair ? ?  ?Frequency Min 3X/week ?  ? ? ?Co-evaluation   ?  ?  ?  ?  ? ? ?  ?AM-PAC PT "6 Clicks" Mobility  ?Outcome Measure Help needed turning from your back to your side while in a flat bed without using bedrails?: Total ?Help needed moving from lying on your back to sitting  on the side of a flat bed without using bedrails?: Total ?Help needed moving to and from a bed to a chair (including a wheelchair)?: Total ?Help needed standing up from a chair using your arms (e.g., wheelchair or bedside chair)?: Total ?Help needed to walk in hospital room?: Total ?Help needed climbing 3-5 steps with a railing? : Total ?6 Click Score: 6 ? ?  ?End of Session   ?Activity Tolerance: Patient limited by pain ?Patient left: in bed;with bed alarm set;with call bell/phone within reach;with family/visitor present ?Nurse Communication: Mobility status;Need for lift equipment ?PT Visit Diagnosis: Difficulty in walking, not elsewhere classified (R26.2);Pain;Repeated falls (R29.6);History of falling (Z91.81) ?Pain - Right/Left: Left ?Pain - part of body: Hip ?  ? ?Time: [id number] ?PT Time Calculation (min) (ACUTE ONLY): 13 min ? ? ?Charges:   PT Evaluation ?$PT Eval Moderate Complexity: 1 Mod ?  ?  ?   ? ?  Blondell Reveal Kistler PT 09/06/2021  ?Acute Rehabilitation Services ?Pager [phone] ?Office [phone] ? ? ?

## 2021-09-06 NOTE — Hospital Course (Addendum)
Natasha Harper is a 87 y.o. female with a history of hypertension, hyperlipidemia, CAD, hypothyroidism. Patient presented secondary to a fall and found to have a left hip fracture. Orthopedic surgery consulted and performed ORIF on 3/2. Discharge to SNF. ?

## 2021-09-06 NOTE — Progress Notes (Signed)
PROGRESS NOTE    Natasha Harper  [id number] DOB: Aug 06, 1934 DOA: 09/04/2021 PCP: Burnis Medin, MD   Brief Narrative: Natasha Harper is a 87 y.o. female with a history of hypertension, hyperlipidemia, CAD, hypothyroidism. Patient presented secondary to a fall and found to have a left hip fracture. Orthopedic surgery consulted and performed ORIF on 3/2. Plan for SNF.   Assessment and Plan: * Hip fracture ([place]) Secondary to fall. Orthopedic surgery consulted and performed ORIF on 3/2. Weight bearing as tolerated. PT recommending SNF. -Orthopedic surgery recommendations: pending today -PT/OT  Fall at home, initial encounter Trip and fall. No loss of consciousness.  Primary hypertension Patient is prescribed amlodipine (listed as not taking)and losartan (listed as not taking) as an outpatient which were held on admission. Uncontrolled blood pressure. -Resume home amlodipine  Anemia Chronic. Stable. -Repeat CBC  Hyponatremia Mild. Asymptomatic.  Hypothyroidism -Continue Synthroid  Dyslipidemia -Continue Lipitor     DVT prophylaxis: Per orthopedic surgery: Aspirin Code Status:   Code Status: Full Code Family Communication: Husband at bedside Disposition Plan: Discharge to SNF once bed is available   Consultants:  Orthopedic surgery  Procedures:  ORIF (09/05/2021)  Antimicrobials: None    Subjective: Patient reports pain of her hip. She states it is too early to work with physical therapy and wants to wait one day. Reiterates how it is her body that is in pain.  Objective: BP (!) 144/75 (BP Location: Left Arm)    Pulse 92    Temp 98.6 F (37 C) (Oral)    Resp 14    Ht 5\' 1"  (1.549 m)    Wt 47 kg    LMP 07/07/1997    SpO2 97%    BMI 19.58 kg/m   Examination:  General exam: Appears calm and comfortable Respiratory system: Clear to auscultation. Respiratory effort normal. Cardiovascular system: S1 & S2 heard, RRR. No murmurs, rubs, gallops or  clicks. Gastrointestinal system: Abdomen is nondistended, soft and nontender. No organomegaly or masses felt. Normal bowel sounds heard. Central nervous system: Alert and oriented. No focal neurological deficits. Musculoskeletal: No edema. No calf tenderness. Incision areas with intact dressing and no underlying ecchymosis. Skin: No cyanosis. No rashes Psychiatry: Judgement and insight appear normal. Mood & affect appropriate.    Data Reviewed: I have personally reviewed following labs and imaging studies  CBC Lab Results  Component Value Date   WBC 7.5 09/05/2021   RBC 2.92 (L) 09/05/2021   HGB 10.0 (L) 09/05/2021   HCT 30.3 (L) 09/05/2021   MCV 103.8 (H) 09/05/2021   MCH 34.2 (H) 09/05/2021   PLT 318 09/05/2021   MCHC 33.0 09/05/2021   RDW 14.5 09/05/2021   LYMPHSABS 0.7 09/05/2021   MONOABS 0.5 09/05/2021   EOSABS 0.0 09/05/2021   BASOSABS 0.0 [id number]     Last metabolic panel Lab Results  Component Value Date   NA 130 (L) 09/05/2021   K 3.8 09/05/2021   CL 99 09/05/2021   CO2 24 09/05/2021   BUN 19 09/05/2021   CREATININE 0.30 (L) 09/05/2021   GLUCOSE 114 (H) 09/05/2021   GFRNONAA >60 09/05/2021   GFRAA 127 08/23/2008   CALCIUM 9.0 09/05/2021   PROT 6.6 07/08/2021   ALBUMIN 2.7 (L) 07/08/2021   BILITOT 0.7 07/08/2021   ALKPHOS 544 (H) 07/08/2021   AST 44 (H) 07/08/2021   ALT 40 07/08/2021   ANIONGAP 7 09/05/2021    GFR: Estimated Creatinine Clearance: 38.8 mL/min (A) (by C-G formula based on  SCr of 0.3 mg/dL (L)).  Recent Results (from the past 240 hour(s))  Resp Panel by RT-PCR (Flu A&B, Covid) Nasopharyngeal Swab     Status: None   Collection Time: 09/05/21  1:50 AM   Specimen: Nasopharyngeal Swab; Nasopharyngeal(NP) swabs in vial transport medium  Result Value Ref Range Status   SARS Coronavirus 2 by RT PCR NEGATIVE NEGATIVE Final    Comment: (NOTE) SARS-CoV-2 target nucleic acids are NOT DETECTED.  The SARS-CoV-2 RNA is generally detectable in  upper respiratory specimens during the acute phase of infection. The lowest concentration of SARS-CoV-2 viral copies this assay can detect is 138 copies/mL. A negative result does not preclude SARS-Cov-2 infection and should not be used as the sole basis for treatment or other patient management decisions. A negative result may occur with  improper specimen collection/handling, submission of specimen other than nasopharyngeal swab, presence of viral mutation(s) within the areas targeted by this assay, and inadequate number of viral copies(<138 copies/mL). A negative result must be combined with clinical observations, patient history, and epidemiological information. The expected result is Negative.  Fact Sheet for Patients:  EntrepreneurPulse.com.au  Fact Sheet for Healthcare Providers:  IncredibleEmployment.be  This test is no t yet approved or cleared by the [place] FDA and  has been authorized for detection and/or diagnosis of SARS-CoV-2 by FDA under an Emergency Use Authorization (EUA). This EUA will remain  in effect (meaning this test can be used) for the duration of the COVID-19 declaration under Section 564(b)(1) of the Act, 21 U.S.C.section 360bbb-3(b)(1), unless the authorization is terminated  or revoked sooner.       Influenza A by PCR NEGATIVE NEGATIVE Final   Influenza B by PCR NEGATIVE NEGATIVE Final    Comment: (NOTE) The Xpert Xpress SARS-CoV-2/FLU/RSV plus assay is intended as an aid in the diagnosis of influenza from Nasopharyngeal swab specimens and should not be used as a sole basis for treatment. Nasal washings and aspirates are unacceptable for Xpert Xpress SARS-CoV-2/FLU/RSV testing.  Fact Sheet for Patients: EntrepreneurPulse.com.au  Fact Sheet for Healthcare Providers: IncredibleEmployment.be  This test is not yet approved or cleared by the [place] FDA and has been  authorized for detection and/or diagnosis of SARS-CoV-2 by FDA under an Emergency Use Authorization (EUA). This EUA will remain in effect (meaning this test can be used) for the duration of the COVID-19 declaration under Section 564(b)(1) of the Act, 21 U.S.C. section 360bbb-3(b)(1), unless the authorization is terminated or revoked.  Performed at [facility], [place] [address] [place], [place] [address]   Surgical pcr screen     Status: None   Collection Time: 09/05/21 12:54 PM   Specimen: Nasal Mucosa; Nasal Swab  Result Value Ref Range Status   MRSA, PCR NEGATIVE NEGATIVE Final   Staphylococcus aureus NEGATIVE NEGATIVE Final    Comment: (NOTE) The Xpert SA Assay (FDA approved for NASAL specimens in patients 9 years of age and older), is one component of a comprehensive surveillance program. It is not intended to diagnose infection nor to guide or monitor treatment. Performed at [facility], [place] [address], [place],  [address]       Radiology Studies: CT Thoracic Spine Wo Contrast  Result Date: 09/05/2021 CLINICAL DATA:  Fall EXAM: CT THORACIC AND LUMBAR SPINE WITHOUT CONTRAST TECHNIQUE: Multidetector CT imaging of the thoracic and lumbar spine was performed without contrast. Multiplanar CT image reconstructions were also generated. RADIATION DOSE REDUCTION: This exam was performed according to the  departmental dose-optimization program which includes automated exposure control, adjustment of the mA and/or kV according to patient size and/or use of iterative reconstruction technique. COMPARISON:  07/05/2021 FINDINGS: CT THORACIC SPINE FINDINGS Alignment: Normal. Vertebrae: Mild compression deformity of T1 is unchanged. Now status post T6 vertebral augmentation without progressive height loss. Chronic compression deformities at T8-T11 are unchanged. No acute compression fracture. Paraspinal and other soft tissues: There is calcific aortic  atherosclerosis. Cardiomegaly with bilateral dependent atelectasis. Disc levels: No spinal canal or neural foraminal stenosis. CT LUMBAR SPINE FINDINGS Segmentation: 5 lumbar type vertebrae. Alignment: Grade 1 anterolisthesis at L4-5. Vertebrae: Chronic compression deformity of L1.  No acute fracture. Paraspinal and other soft tissues: Calcific aortic atherosclerosis. Disc levels: No spinal canal stenosis or nerve root impingement. IMPRESSION: 1. No acute fracture of the thoracic or lumbar spine. 2. Status post T6 vertebral augmentation without progressive height loss. 3. Chronic compression deformities at T1, T8-T11 and L1. Aortic Atherosclerosis (ICD10-I70.0). Electronically Signed   By: Ulyses Jarred M.D.   On: 09/05/2021 02:19   CT Lumbar Spine Wo Contrast  Result Date: 09/05/2021 CLINICAL DATA:  Fall EXAM: CT THORACIC AND LUMBAR SPINE WITHOUT CONTRAST TECHNIQUE: Multidetector CT imaging of the thoracic and lumbar spine was performed without contrast. Multiplanar CT image reconstructions were also generated. RADIATION DOSE REDUCTION: This exam was performed according to the departmental dose-optimization program which includes automated exposure control, adjustment of the mA and/or kV according to patient size and/or use of iterative reconstruction technique. COMPARISON:  07/05/2021 FINDINGS: CT THORACIC SPINE FINDINGS Alignment: Normal. Vertebrae: Mild compression deformity of T1 is unchanged. Now status post T6 vertebral augmentation without progressive height loss. Chronic compression deformities at T8-T11 are unchanged. No acute compression fracture. Paraspinal and other soft tissues: There is calcific aortic atherosclerosis. Cardiomegaly with bilateral dependent atelectasis. Disc levels: No spinal canal or neural foraminal stenosis. CT LUMBAR SPINE FINDINGS Segmentation: 5 lumbar type vertebrae. Alignment: Grade 1 anterolisthesis at L4-5. Vertebrae: Chronic compression deformity of L1.  No acute  fracture. Paraspinal and other soft tissues: Calcific aortic atherosclerosis. Disc levels: No spinal canal stenosis or nerve root impingement. IMPRESSION: 1. No acute fracture of the thoracic or lumbar spine. 2. Status post T6 vertebral augmentation without progressive height loss. 3. Chronic compression deformities at T1, T8-T11 and L1. Aortic Atherosclerosis (ICD10-I70.0). Electronically Signed   By: Ulyses Jarred M.D.   On: 09/05/2021 02:19   DG C-Arm 1-60 Min-No Report  Result Date: 09/05/2021 Fluoroscopy was utilized by the requesting physician.  No radiographic interpretation.   DG Hip Unilat With Pelvis 2-3 Views Left  Result Date: 09/04/2021 CLINICAL DATA:  Fall.  Left hip pain. EXAM: DG HIP (WITH OR WITHOUT PELVIS) 2-3V LEFT COMPARISON:  None. FINDINGS: Comminuted and angulated intertrochanteric fracture of the left femur. No dislocation. The bones are osteopenic. Age indeterminate fracture of the left inferior pubic ramus. The soft tissues are unremarkable. Vascular calcification noted. IMPRESSION: 1. Comminuted and angulated intertrochanteric fracture of the left femur. 2. Age indeterminate fracture of the left inferior pubic ramus. Electronically Signed   By: Anner Crete M.D.   On: 09/04/2021 23:38   DG FEMUR MIN 2 VIEWS LEFT  Result Date: 09/05/2021 CLINICAL DATA:  Fluoroscopic assistance for internal fixation of intertrochanteric fracture of left femur EXAM: LEFT FEMUR 2 VIEWS COMPARISON:  09/04/2021 FINDINGS: There is interval reduction and internal fixation of intertrochanteric fracture of left femur with intramedullary rod. Fluoroscopic time was 53 seconds. Radiation dose is 7.06 mGy. IMPRESSION: Fluoroscopic assistance was provided  for internal fixation of intertrochanteric fracture of left femur. Electronically Signed   By: Elmer Picker M.D.   On: 09/05/2021 18:25      LOS: 1 day    Cordelia Poche, MD [facility] 09/06/2021, 1:41 PM   If 7PM-7AM, please contact  night-coverage [email or web address]

## 2021-09-06 NOTE — TOC Progression Note (Signed)
Transition of Care (TOC) - Progression Note  ? ? ?Patient Details  ?Name: Natasha Harper ?MRN: [id number] ?Date of Birth: 1935-01-19 ? ?Transition of Care (TOC) CM/SW Contact  ?Geni Bers, RN ?Phone Number: ?09/06/2021, 4:16 PM ? ?Clinical Narrative:    ?Spoke with pt's husband and wife via telephone. They selected [facility] for SNF. However [facility] will not take her until Monday.  ? ? ?  ?  ? ?Expected Discharge Plan and Services ?  ?  ?  ?  ?  ?                ?  ?  ?  ?  ?  ?  ?  ?  ?  ?  ? ? ?Social Determinants of Health (SDOH) Interventions ?  ? ?Readmission Risk Interventions ?Readmission Risk Prevention Plan 07/17/2021  ?Transportation Screening Complete  ?PCP or Specialist Appt within 3-5 Days Not Complete  ?Not Complete comments Plan to d/c to [facility] Inpatient rehab  ?HRI or Home Care Consult Not Complete  ?HRI or Home Care Consult comments Patient to d/c to [facility] Inpatient rehab  ?Social Work Consult for Recovery Care Planning/Counseling Complete  ?Palliative Care Screening Not Applicable  ?Medication Review Oceanographer) Referral to Pharmacy  ?Some recent data might be hidden  ? ? ?

## 2021-09-06 NOTE — NC FL2 (Signed)
?[place] MEDICAID FL2 LEVEL OF CARE SCREENING TOOL  ?  ? ?IDENTIFICATION  ?Patient Name: ?Natasha Harper Birthdate: March 18, 1935 Sex: female Admission Date (Current Location): ?09/04/2021  ?[place] Number: ? [place] ?  Facility and Address:  ?[facility],  [address], [place] [address] ?     Provider Number: ?[id number]  ?Attending Physician Name and Address:  ?Narda Bonds, MD ? Relative Name and Phone Number:  ?Eklund,Peter Spouse [phone] Home, [phone] Work,  [phone] Cell ?   ?Current Level of Care: ?Hospital Recommended Level of Care: ?Skilled Nursing Facility Prior Approval Number: ?  ? ?Date Approved/Denied: ?  PASRR Number: ?[id number] A ? ?Discharge Plan: ?SNF ?  ? ?Current Diagnoses: ?Patient Active Problem List  ? Diagnosis Date Noted  ? Hip fracture (HCC) 09/05/2021  ? Fall at home, initial encounter 09/05/2021  ? Primary hypertension   ? Elevated troponin   ? Demand ischemia (HCC)   ? Thoracic compression fracture (HCC) 07/05/2021  ? Vertebral fracture, osteoporotic (HCC) 04/15/2021  ? Hyponatremia 04/14/2021  ? Hypokalemia 04/14/2021  ? Anemia 04/14/2021  ? Compression fracture of thoracic vertebra, closed, initial encounter (HCC) 04/13/2021  ? Sacral fracture, closed (HCC) 04/13/2021  ? Compression fracture of thoracic vertebra (HCC) 02/06/2021  ? Compression of lumbar vertebra (HCC) 02/06/2021  ? Back pain 02/05/2021  ? Medicare annual wellness visit, subsequent 12/20/2014  ? Decreased hearing 10/05/2013  ? Dyslipidemia   ? Carotid arterial disease (HCC)   ? Hypothyroidism   ? Ejection fraction   ? Visit for preventive health examination 06/02/2011  ? Medication side effect 06/02/2011  ? Disorder of bone and cartilage 04/07/2008  ? HYPERTENSION, WHITE COAT 04/07/2008  ? HYPERGLYCEMIA, FASTING 07/21/2007  ? THYROID NODULE 04/05/2007  ? ? ?Orientation RESPIRATION BLADDER Height & Weight   ?  ?Self, Time, Situation, Place ? O2 (2L O2 [address]) Continent Weight: 47  kg ?Height:  5\' 1"  (154.9 cm)  ?BEHAVIORAL SYMPTOMS/MOOD NEUROLOGICAL BOWEL NUTRITION STATUS  ?    Continent Diet (Regular)  ?AMBULATORY STATUS COMMUNICATION OF NEEDS Skin   ?Extensive Assist Verbally Surgical wounds (Left Hip ORIF) ?  ?  ?  ?    ?     ?     ? ? ?Personal Care Assistance Level of Assistance  ?Bathing, Feeding, Dressing Bathing Assistance: Maximum assistance ?Feeding assistance: Limited assistance ?Dressing Assistance: Maximum assistance ?   ? ?Functional Limitations Info  ?Sight, Hearing, Speech Sight Info: Adequate ?Hearing Info: Impaired ?Speech Info: Adequate  ? ? ?SPECIAL CARE FACTORS FREQUENCY  ?PT (By licensed PT), OT (By licensed OT)   ?  ?PT Frequency: 5x week ?OT Frequency: 5x week ?  ?  ?  ?   ? ? ?Contractures Contractures Info: Not present  ? ? ?Additional Factors Info  ?Code Status, Allergies Code Status Info: FULL ?Allergies Info: Actonel (Risedronate Sodium), Evista (Raloxifene), Fosamax (Alendronate Sodium) ?  ?  ?  ?   ? ?Current Medications (09/06/2021):  This is the current hospital active medication list ?Current Facility-Administered Medications  ?Medication Dose Route Frequency Provider Last Rate Last Admin  ? acetaminophen (TYLENOL) tablet 325-650 mg  325-650 mg Oral Q6H PRN 11/06/2021, MD      ? alum & mag hydroxide-simeth (MAALOX/MYLANTA) 200-200-20 MG/5ML suspension 30 mL  30 mL Oral Q4H PRN 01-13-2001, MD      ? amLODipine (NORVASC) tablet 5 mg  5 mg Oral Daily Kathryne Hitch, MD      ?  aspirin EC tablet 325 mg  325 mg Oral Q breakfast Kathryne Hitch, MD   325 mg at 09/06/21 4132  ? atorvastatin (LIPITOR) tablet 80 mg  80 mg Oral QHS Kathryne Hitch, MD   80 mg at 09/05/21 2207  ? Chlorhexidine Gluconate Cloth 2 % PADS 6 each  6 each Topical Daily Narda Bonds, MD      ? docusate sodium (COLACE) capsule 100 mg  100 mg Oral BID Kathryne Hitch, MD   100 mg at 09/05/21 2207  ? folic acid (FOLVITE) tablet 1 mg  1 mg Oral  Daily Kathryne Hitch, MD   1 mg at 09/06/21 0856  ? HYDROcodone-acetaminophen (NORCO) 7.5-325 MG per tablet 1-2 tablet  1-2 tablet Oral Q4H PRN Kathryne Hitch, MD   2 tablet at 09/06/21 0850  ? HYDROcodone-acetaminophen (NORCO/VICODIN) 5-325 MG per tablet 1-2 tablet  1-2 tablet Oral Q4H PRN Kathryne Hitch, MD   2 tablet at 09/06/21 0020  ? levothyroxine (SYNTHROID) tablet 50 mcg  50 mcg Oral Q0600 Kathryne Hitch, MD   50 mcg at 09/06/21 4401  ? menthol-cetylpyridinium (CEPACOL) lozenge 3 mg  1 lozenge Oral PRN Kathryne Hitch, MD      ? Or  ? phenol (CHLORASEPTIC) mouth spray 1 spray  1 spray Mouth/Throat PRN Kathryne Hitch, MD      ? methocarbamol (ROBAXIN) tablet 500 mg  500 mg Oral Q6H PRN Kathryne Hitch, MD      ? Or  ? methocarbamol (ROBAXIN) 500 mg in dextrose 5 % 50 mL IVPB  500 mg Intravenous Q6H PRN Kathryne Hitch, MD      ? metoCLOPramide (REGLAN) tablet 5-10 mg  5-10 mg Oral Q8H PRN Kathryne Hitch, MD      ? Or  ? metoCLOPramide (REGLAN) injection 5-10 mg  5-10 mg Intravenous Q8H PRN Kathryne Hitch, MD      ? morphine (PF) 2 MG/ML injection 0.5-1 mg  0.5-1 mg Intravenous Q2H PRN Kathryne Hitch, MD      ? omega-3 acid ethyl esters (LOVAZA) capsule 1,000 mg  1,000 mg Oral Daily Kathryne Hitch, MD      ? ondansetron [facility]) tablet 4 mg  4 mg Oral Q6H PRN Kathryne Hitch, MD      ? Or  ? ondansetron [facility]) injection 4 mg  4 mg Intravenous Q6H PRN Kathryne Hitch, MD      ? pantoprazole (PROTONIX) EC tablet 40 mg  40 mg Oral Daily Kathryne Hitch, MD   40 mg at 09/06/21 0272  ? ? ? ?Discharge Medications: ?Please see discharge summary for a list of discharge medications. ? ?Relevant Imaging Results: ? ?Relevant Lab Results: ? ? ?Additional Information ?SS#[id number] ? ?Lakynn Halvorsen, RN ? ? ? ? ?

## 2021-09-06 NOTE — Assessment & Plan Note (Addendum)
Continue Synthroid °

## 2021-09-06 NOTE — Progress Notes (Signed)
Subjective: ?1 Day Post-Op Procedure(s) (LRB): ?orif left hip (Left) ?Patient reports pain as moderate.  Husband at the bedside.  Very limitied mobility today.  Acute blood loss anemia from her fracture and surgery. ? ?Objective: ?Vital signs in last 24 hours: ?Temp:  [97.4 ?F (36.3 ?C)-98.6 ?F (37 ?C)] 98.6 ?F (37 ?C) (03/03 0949) ?Pulse Rate:  [82-97] 92 (03/03 1333) ?Resp:  [11-20] 14 (03/03 1333) ?BP: (104-174)/(54-83) 144/75 (03/03 1333) ?SpO2:  [86 %-100 %] 97 % (03/03 1333) ?Weight:  [47 kg] 47 kg (03/03 0438) ? ?Intake/Output from previous day: ?03/02 0701 - 03/03 0700 ?In: 1320 [P.O.:120; I.V.:1000; IV Piggyback:200] ?Out: 612 [Urine:500; Blood:112] ?Intake/Output this shift: ?No intake/output data recorded. ? ?Recent Labs  ?  09/05/21 ?0150 09/06/21 ?1349  ?HGB 10.0* 8.1*  ? ?Recent Labs  ?  09/05/21 ?0150 09/06/21 ?1349  ?WBC 7.5 6.4  ?RBC 2.92* 2.33*  ?HCT 30.3* 24.3*  ?PLT 318 191  ? ?Recent Labs  ?  09/05/21 ?0150 09/06/21 ?1349  ?NA 130* 126*  ?K 3.8 4.1  ?CL 99 94*  ?CO2 24 25  ?BUN 19 16  ?CREATININE 0.30* 0.43*  ?GLUCOSE 114* 110*  ?CALCIUM 9.0 8.4*  ? ?No results for input(s): LABPT, INR in the last 72 hours. ? ?Sensation intact distally ?Intact pulses distally ?Dorsiflexion/Plantar flexion intact ?Incision: scant drainage ? ? ?Assessment/Plan: ?1 Day Post-Op Procedure(s) (LRB): ?orif left hip (Left) ?Up with therapy when possible with full WBAT left hip ?Will need SNF placement post hospitalization. ? ? ? ? ? ?Kathryne Hitch ?09/06/2021, 5:31 PM ? ?

## 2021-09-06 NOTE — Plan of Care (Signed)

## 2021-09-06 NOTE — Progress Notes (Signed)
Initial Nutrition Assessment ? ?DOCUMENTATION CODES:  ?Not applicable ? ?INTERVENTION:  ?Continue current diet as ordered, encourage PO intake ?Magic cup TID with meals, each supplement provides 290 kcal and 9 grams of protein ?Ensure Enlive po BID, each supplement provides 350 kcal and 20 grams of protein. ?MVI with minerals daily ? ?NUTRITION DIAGNOSIS:  ?Increased nutrient needs related to hip fracture as evidenced by estimated needs. ? ?GOAL:  ?Patient will meet greater than or equal to 90% of their needs ? ?MONITOR:  ?PO intake, Supplement acceptance, Weight trends ? ?REASON FOR ASSESSMENT:  ?Consult ?Hip fracture protocol ? ?ASSESSMENT:  ?87 y.o. female with history of CAD, HTN, HLD, and osteoporosis presented to the ED after a fall at home. Imaging in ED suggestive of left hip fracture. ? ?3/2 - ORIF of the left hip ? ?RD working remotely. Noted that pt is hard of hearing. Discussed with RN - communication would likely not be effective via telephone. RN reports that pt has been minimally interactive with staff today and intake of meals has been poor.  ? ?Reviewed weight and 6.9% weight loss seen in ~6 Months (8/30-3/3) which is not severe, but concerning based on patient's age. ? ?Will add supplements to pt's meal tray and ensure high protein between meals. Will follow-up for nutrition hx and physical exam. Suspect pt may have a degree of malnutrition based on low geriatric BMI.  ? ?Nutritionally Relevant Medications: ?Scheduled Meds: ? atorvastatin  80 mg Oral QHS  ? docusate sodium  100 mg Oral BID  ? folic acid  1 mg Oral Daily  ? levothyroxine  50 mcg Oral Q0600  ? omega-3 acid ethyl esters  1,000 mg Oral Daily  ? pantoprazole  40 mg Oral Daily  ? ?PRN Meds: alum & mag hydroxide-simeth, metoCLOPramide, ondansetrone ? ?Labs Reviewed: ?Sodium 130 ?Creatinine .30  ? ?NUTRITION - FOCUSED PHYSICAL EXAM: ?Defer to in-person assessment ? ?Diet Order:   ?Diet Order   ? ?       ?  Diet regular Room service  appropriate? Yes; Fluid consistency: Thin  Diet effective now       ?  ? ?  ?  ? ?  ? ? ?EDUCATION NEEDS:  ?No education needs have been identified at this time ? ?Skin:  Skin Assessment: Reviewed RN Assessment (surgical incision, left hip) ? ?Last BM:  2/28 ? ?Height:  ?Ht Readings from Last 1 Encounters:  ?09/06/21 5\' 1"  (1.549 m)  ? ?Weight:  ?Wt Readings from Last 1 Encounters:  ?09/06/21 47 kg  ? ? ?Ideal Body Weight:  47.7 kg ? ?BMI:  Body mass index is 19.58 kg/m?. ? ?Estimated Nutritional Needs:  ?Kcal:  1400-1600 kcal/d ?Protein:  70-80g/d ?Fluid:  1.5-1.6L/d ? ? ?11/06/21, RD, LDN ?Clinical Dietitian ?RD pager # available in AMION  ?After hours/weekend pager # available in AMION ?

## 2021-09-06 NOTE — Assessment & Plan Note (Signed)
Trip and fall. No loss of consciousness. ?

## 2021-09-06 NOTE — Progress Notes (Signed)
OT Cancellation Note ? ?Patient Details ?Name: Natasha Harper ?MRN: [id number] ?DOB: December 24, 1934 ? ? ?Cancelled Treatment:    Reason Eval/Treat Not Completed: Patient declined, no reason specified. Patient declined therapy today reporting too much pain and needing her hip to heal first. Will f/u as able. ? ?Kelli Churn ?09/06/2021, 11:34 AM ?

## 2021-09-06 NOTE — Assessment & Plan Note (Addendum)
Mild. Asymptomatic. Initial concern for hypovolemic hyponatremia. Started on IV fluids with no significant improvement. IV fluids held. Normal TSH. Cortisol is normal. Patient started on sodium tablets with some improvement. Sodium stable. Discharge with 2 days of sodium tablets and recommend repeat BMP in 3-5 days ?

## 2021-09-06 NOTE — Op Note (Signed)
NAME: Natasha Harper, Natasha G. ?MEDICAL RECORD NO: [id number] ?ACCOUNT NO: [id number] ?DATE OF BIRTH: 1934/09/28 ?FACILITY: [facility] ?LOCATION: [facility] ?PHYSICIAN: Vanita Panda. Magnus Ivan, MD ? ?Operative Report  ? ?DATE OF PROCEDURE: 09/05/2021 ? ?PREOPERATIVE DIAGNOSIS:  Left complex intertrochanteric proximal femur/hip fracture. ? ?POSTOPERATIVE DIAGNOSIS:  Left complex intertrochanteric proximal femur/hip fracture. ? ?PROCEDURE:  Open reduction/internal fixation of left intertrochanteric hip fracture. ? ?IMPLANTS:  [employer] 10 x 360 InterTan femoral nail with a 95/90 lag screw compression screw interdigitating construct. ? ?SURGEON:  Vanita Panda. Magnus Ivan, MD ? ?ASSISTANT: Rexene Edison, PA-C. ? ?ANESTHESIA:  Spinal. ? ?ANTIBIOTICS:  2 g IV Ancef. ? ?BLOOD LOSS:  Less than 100 mL. ? ?COMPLICATIONS:  None. ? ?INDICATIONS:  The patient an 87 year old female who fell late last evening, injuring her left hip.  She was brought to the [facility] emergency room and found to have intertrochanteric hip fracture.  She was admitted graciously to the medicine service  ?and cleared for surgery for today.  I talked to her and her husband about the surgery and the recommendations for open reduction/internal fixation with an intramedullary nail and hip screw construct.  I described what the surgery involves as well as the  ?risks and benefits of surgery.  Given her high function and level of mobility and the significant pain she is having, surgery is certainly appropriate. ? ?DESCRIPTION OF PROCEDURE:  After informed consent was obtained, appropriate left hip was marked.  She was brought to the operating room and spinal anesthesia was obtained while she was on the stretcher.  She was then placed supine on the fracture table.  ? The perineal post in place and her left leg in line skeletal traction in a traction boot.  Her right nonoperative leg was placed in a well leg holder with appropriate abduction and flexion of the hip.  We then  assessed the fracture radiographically and  ?we were able to pull traction, internal rotation and get the fracture reduced anatomically.  We then actually chose our intramedullary rod, keeping it sterile within the box, lying it on the femur, so we could assess it radiographically and that is how  ?we chose our 10 x 360 femoral nail.  This was opened sterilely on the back table.  This was for a left femur.  We then prepped the left hip area with DuraPrep and sterile drapes.  A timeout was called and she was identified as correct patient, correct  ?left hip.  We then made an incision just proximal to the greater trochanter and dissected down the tip of the greater trochanter.  Under direct fluoroscopy, a temporary guide pin was inserted in antegrade fashion from the tip of the greater trochanter  ?down to just past the lesser trochanter.  We verified placement on AP and lateral planes.  We then used initiating reamer to open up the femoral canal and removed the guide pin.  We then were able to place our 10 x 360 femoral nail for left femur down  ?the femoral canal without needing to ream.  Using an outrigger guide, we were then able to target the lateral aspect of the hip to place a temporary guide pin through a separate incision.  This went into a good position of the femoral head and neck,  ?trying to maximize just slight inferior and posterior for bone quality.  Once we put the guide pin, we took a measurement off of this, and we chose a 95 length lag screw with a 90  length compression screw.  We drilled for those levels and then placed the ? interdigitating locking screw with the lag screw first and then compression screw.  Before last few turns of compression, we did let traction off the bed and we could see under fluoroscopy, the fracture compressing.  We then removed all instrumentation  ?from the leg and the outrigger guide as well.  We put the hip through range of motion under direct fluoroscopy and it moved  as a unit.  We were pleased with the placement of the implants.  Both small wounds were then irrigated with normal saline  ?solution.  The deep tissue was closed with 0 Vicryl followed by 2-0 Vicryl to close subcutaneous tissue.  Staples were used to close the skin.  A well-padded sterile dressing was applied.  A Foley catheter was placed and she was taken off the fracture  ?table in stable condition with all final counts being correct and no complications noted.  Of note, Rexene Edison, PAC's assistance was crucial for facilitating every aspect of this case to patient positioning, to helping verify placement of implants, to  ?soft tissue manipulation and retracting as well as closure of the wound. ? ? ?NIK ?D: 09/05/2021 6:08:03 pm T: 09/06/2021 12:10:00 am  ?JOB: [id number]/ [id number]  ?

## 2021-09-06 NOTE — Assessment & Plan Note (Addendum)
Secondary to fall. Orthopedic surgery consulted and performed ORIF on 3/2. Weight bearing as tolerated. PT recommending SNF. Orthopedic surgery recommendations are WBAT and aspirin for DVT prophylaxis ?

## 2021-09-07 LAB — BASIC METABOLIC PANEL
Anion gap: 6 (ref 5–15)
BUN: 12 mg/dL (ref 8–23)
CO2: 25 mmol/L (ref 22–32)
Calcium: 8.3 mg/dL — ABNORMAL LOW (ref 8.9–10.3)
Chloride: 97 mmol/L — ABNORMAL LOW (ref 98–111)
Creatinine, Ser: 0.35 mg/dL — ABNORMAL LOW (ref 0.44–1.00)
GFR, Estimated: >60 mL/min (ref >60)
Glucose, Bld: 97 mg/dL (ref 70–99)
Potassium: 3.9 mmol/L (ref 3.5–5.1)
Sodium: 128 mmol/L — ABNORMAL LOW (ref 135–145)

## 2021-09-07 LAB — CBC
HCT: 24.6 % — ABNORMAL LOW (ref 36.0–46.0)
Hemoglobin: 8.1 g/dL — ABNORMAL LOW (ref 12.0–15.0)
MCH: 34.3 pg — ABNORMAL HIGH (ref 26.0–34.0)
MCHC: 32.9 g/dL (ref 30.0–36.0)
MCV: 104.2 fL — ABNORMAL HIGH (ref 80.0–100.0)
Platelets: 197 10*3/uL (ref 150–400)
RBC: 2.36 MIL/uL — ABNORMAL LOW (ref 3.87–5.11)
RDW: 14.3 % (ref 11.5–15.5)
WBC: 6.2 10*3/uL (ref 4.0–10.5)
nRBC: 0 % (ref 0.0–0.2)

## 2021-09-07 NOTE — Progress Notes (Signed)
? 09/07/21 1500  ?PT Visit Information  ?Last PT Received On 09/07/21 ? ?Pt progressing slowly with PT, back and hip pain but willing to work within limits. Placed lumbar roll once pt in supine and pillow under LLE and pt reported incr comfort.  ?Assistance Needed +2 ?(helpful for amb)  ?History of Present Illness 87 y.o. admitted with a fall, Dx of complex  L IT hip fx, s/p ORIF on 09/05/21. PMH includes recent admission 07/04/21 for falls, T1 & T6 compression fractures, s/p KP for T6 (T1 unsuccessful) 07/11/21.  ?Subjective Data  ?Patient Stated Goal rehab  ?Precautions  ?Precautions Fall  ?Precaution Comments multiple falls in past 6 months (spouse reports 4 hospitalizations for falls in 7 months); admission 07/04/21 for falls/compression fxs  ?Restrictions  ?Weight Bearing Restrictions No  ?Pain Assessment  ?Pain Assessment Faces  ?Faces Pain Scale 8  ?Pain Location L hip and back from recent compression fxs  ?Pain Descriptors / Indicators Grimacing;Guarding  ?Pain Intervention(s) Limited activity within patient's tolerance;Monitored during session;Premedicated before session;Repositioned  ?Cognition  ?Arousal/Alertness Awake/alert  ?Behavior During Therapy [facility] for tasks assessed/performed;Anxious  ?Overall Cognitive Status Within Functional Limits for tasks assessed  ?Bed Mobility  ?Overal bed mobility Needs Assistance  ?Bed Mobility Sit to Supine  ?Sidelying to sit HOB elevated;Max assist;Supervision  ?General bed mobility comments incr time needed, assist to progress bil LEs on to bed, bed pad utilized to scoot hips laterally, pt able to control trunk descent  ?Transfers  ?Overall transfer level Needs assistance  ?Equipment used Rolling walker (2 wheels)  ?Transfers Sit to/from Stand;Bed to chair/wheelchair/BSC  ?Sit to Stand Mod assist  ?Bed to/from chair/wheelchair/BSC transfer type: Step pivot  ?Step pivot transfers Mod assist  ?General transfer comment assist to rise and transition to RW, cues for hand  placement and to power up with LEs. multi-modal cues for wt shifting, sequence and use of RW for stand step pivot bed to chair  ?Exercises  ?Exercises General Lower Extremity  ?PT - End of Session  ?Equipment Utilized During Treatment Gait belt  ?Activity Tolerance Patient tolerated treatment well  ?Patient left in bed;with bed alarm set;with call bell/phone within reach  ?Nurse Communication Mobility status  ? PT - Assessment/Plan  ?PT Plan Current plan remains appropriate  ?PT Visit Diagnosis Difficulty in walking, not elsewhere classified (R26.2);Pain;Repeated falls (R29.6);History of falling (Z91.81)  ?Pain - Right/Left Left  ?Pain - part of body Hip  ?PT Frequency (ACUTE ONLY) Min 3X/week  ?Follow Up Recommendations Skilled nursing-short term rehab (<3 hours/day)  ?Assistance recommended at discharge Frequent or constant Supervision/Assistance  ?Patient can return home with the following Assist for transportation;Help with stairs or ramp for entrance;A lot of help with bathing/dressing/bathroom;A lot of help with walking and/or transfers;Assistance with cooking/housework  ?PT equipment None recommended by PT  ?AM-PAC PT "6 Clicks" Mobility Outcome Measure (Version 2)  ?Help needed turning from your back to your side while in a flat bed without using bedrails? 2  ?Help needed moving from lying on your back to sitting on the side of a flat bed without using bedrails? 2  ?Help needed moving to and from a bed to a chair (including a wheelchair)? 2  ?Help needed standing up from a chair using your arms (e.g., wheelchair or bedside chair)? 2  ?Help needed to walk in hospital room? 1  ?Help needed climbing 3-5 steps with a railing?  1  ?6 Click Score 10  ?Consider Recommendation of Discharge To: CIR/SNF/LTACH  ?Progressive Mobility  ?  What is the highest level of mobility based on the progressive mobility assessment? Level 2 (Chairfast) - Balance while sitting on edge of bed and cannot stand  ?Activity Transferred from  chair to bed  ?PT Goal Progression  ?Progress towards PT goals Progressing toward goals  ?Acute Rehab PT Goals  ?PT Goal Formulation With patient/family  ?Time For Goal Achievement 09/20/21  ?Potential to Achieve Goals Fair  ?PT Time Calculation  ?PT Start Time (ACUTE ONLY) 1439  ?PT Stop Time (ACUTE ONLY) 1500  ?PT Time Calculation (min) (ACUTE ONLY) 21 min  ?PT General Charges  ?$$ ACUTE PT VISIT 1 Visit  ?PT Treatments  ?$Therapeutic Activity 8-22 mins  ? ? ?

## 2021-09-07 NOTE — Progress Notes (Signed)
Physical Therapy Treatment ?Patient Details ?Name: Natasha Harper ?MRN: [id number] ?DOB: Feb 10, 1935 ?Today's Date: 09/07/2021 ? ? ?History of Present Illness 87 y.o. admitted with a fall, Dx of complex  L IT hip fx, s/p ORIF on 09/05/21. PMH includes recent admission 07/04/21 for falls, T1 & T6 compression fractures, s/p KP for T6 (T1 unsuccessful) 07/11/21. ? ?  ?PT Comments  ? ? Pt is progressing slowly toward PT goals. Able to tol incr mobility/OOB today with PT. Continue to recommend SNF   ?Recommendations for follow up therapy are one component of a multi-disciplinary discharge planning process, led by the attending physician.  Recommendations may be updated based on patient status, additional functional criteria and insurance authorization. ? ?Follow Up Recommendations ? Skilled nursing-short term rehab (<3 hours/day) ?  ?  ?Assistance Recommended at Discharge Frequent or constant Supervision/Assistance  ?Patient can return home with the following Assist for transportation;Help with stairs or ramp for entrance;A lot of help with bathing/dressing/bathroom;A lot of help with walking and/or transfers;Assistance with cooking/housework ?  ?Equipment Recommendations ? None recommended by PT  ?  ?Recommendations for Other Services   ? ? ?  ?Precautions / Restrictions Precautions ?Precautions: Fall ?Precaution Comments: multiple falls in past 6 months (spouse reports 4 hospitalizations for falls in 7 months); admission 07/04/21 for falls/compression fxs ?Restrictions ?Weight Bearing Restrictions: No  ?  ? ?Mobility ? Bed Mobility ?Overal bed mobility: Needs Assistance ?Bed Mobility: Sidelying to Sit ?  ?Sidelying to sit: HOB elevated, Mod assist, Max assist ?  ?  ?  ?General bed mobility comments: incr time needed, HOB slowly elevated, assist to progress bil LEs off EOB, bed pad utilized to scoot hips laterally, assist to elevate trunk. ?  ? ?Transfers ?Overall transfer level: Needs assistance ?Equipment used: Rolling  walker (2 wheels) ?Transfers: Sit to/from Stand, Bed to chair/wheelchair/BSC ?Sit to Stand: Mod assist, +2 physical assistance, +2 safety/equipment ?  ?Step pivot transfers: Mod assist, Max assist, +2 safety/equipment, +2 physical assistance ?  ?  ?  ?General transfer comment: assist to rise and transition to RW, cues for hand placement and to power up with LEs. multi-modal cues for wt shifting, sequence and use of RW for stand step pivot bed to chair ?  ? ?Ambulation/Gait ?  ?  ?  ?  ?  ?  ?  ?  ? ? ?Stairs ?  ?  ?  ?  ?  ? ? ?Wheelchair Mobility ?  ? ?Modified Rankin (Stroke Patients Only) ?  ? ? ?  ?Balance   ?  ?  ?  ?  ?  ?  ?  ?  ?  ?  ?  ?  ?  ?  ?  ?  ?  ?  ?  ? ?  ?Cognition Arousal/Alertness: Awake/alert ?Behavior During Therapy: [facility] for tasks assessed/performed, Anxious ?Overall Cognitive Status: Within Functional Limits for tasks assessed ?  ?  ?  ?  ?  ?  ?  ?  ?  ?  ?  ?  ?  ?  ?  ?  ?  ?  ?  ? ?  ?Exercises General Exercises - Lower Extremity ?Ankle Circles/Pumps: PROM, Both, 10 reps ?Heel Slides: AROM, Both, 10 reps ? ?  ?General Comments   ?  ?  ? ?Pertinent Vitals/Pain Pain Assessment ?Pain Assessment: Faces ?Faces Pain Scale: Hurts whole lot ?Pain Location: L hip and back from recent compression fxs ?Pain Descriptors / Indicators: Grimacing, Guarding ?Pain  Intervention(s): Limited activity within patient's tolerance, Monitored during session, Premedicated before session, Repositioned  ? ? ?Home Living   ?  ?  ?  ?  ?  ?  ?  ?  ?  ?   ?  ?Prior Function    ?  ?  ?   ? ?PT Goals (current goals can now be found in the care plan section) Acute Rehab PT Goals ?Patient Stated Goal: rehab ?PT Goal Formulation: With patient/family ?Time For Goal Achievement: 09/20/21 ?Potential to Achieve Goals: Fair ?Progress towards PT goals: Progressing toward goals ? ?  ?Frequency ? ? ? Min 3X/week ? ? ? ?  ?PT Plan Current plan remains appropriate  ? ? ?Co-evaluation   ?  ?  ?  ?  ? ?  ?AM-PAC PT "6 Clicks" Mobility    ?Outcome Measure ? Help needed turning from your back to your side while in a flat bed without using bedrails?: Total ?Help needed moving from lying on your back to sitting on the side of a flat bed without using bedrails?: Total ?Help needed moving to and from a bed to a chair (including a wheelchair)?: Total ?Help needed standing up from a chair using your arms (e.g., wheelchair or bedside chair)?: Total ?Help needed to walk in hospital room?: Total ?Help needed climbing 3-5 steps with a railing? : Total ?6 Click Score: 6 ? ?  ?End of Session Equipment Utilized During Treatment: Gait belt ?Activity Tolerance: Patient tolerated treatment well ?Patient left: in chair;with call bell/phone within reach;with chair alarm set;with family/visitor present ?Nurse Communication: Mobility status ?PT Visit Diagnosis: Difficulty in walking, not elsewhere classified (R26.2);Pain;Repeated falls (R29.6);History of falling (Z91.81) ?Pain - Right/Left: Left ?Pain - part of body: Hip ?  ? ? ?Time: [id number] ?PT Time Calculation (min) (ACUTE ONLY): 26 min ? ?Charges:  $Therapeutic Activity: 23-37 mins          ?          ? Baxter Flattery, PT ? ?Acute Rehab Dept [facility]) [phone] ?Pager [phone] ? ?09/07/2021 ? ? ? ?Manvir Thorson ?09/07/2021, 1:54 PM ? ?

## 2021-09-07 NOTE — Progress Notes (Signed)
PROGRESS NOTE    Natasha Harper  [id number] DOB: [birth year] DOA: 09/04/2021 PCP: Burnis Medin, MD   Brief Narrative: CALLEE DRUSCHEL is a 87 y.o. female with a history of hypertension, hyperlipidemia, CAD, hypothyroidism. Patient presented secondary to a fall and found to have a left hip fracture. Orthopedic surgery consulted and performed ORIF on 3/2. Plan for SNF.   Assessment and Plan: * Hip fracture ([place]) Secondary to fall. Orthopedic surgery consulted and performed ORIF on 3/2. Weight bearing as tolerated. PT recommending SNF. -Orthopedic surgery recommendations: WBAT, aspirin for DVT prophylaxis -PT/OT  Fall at home, initial encounter Trip and fall. No loss of consciousness.  Primary hypertension Patient is prescribed amlodipine (listed as not taking) and losartan (listed as not taking) as an outpatient which were held on admission. Uncontrolled blood pressure. Patient declined amlodipine on 3/3. -Continue amlodipine  Anemia Acute blood loss anemia on chronic anemia. Acute from fracture and surgery. Currently stable -Daily CBC  Hyponatremia Mild. Asymptomatic. Likely hypovolemic. Started on IV fluids with slow improvement. -Continue NS IV fluids and increase rate to 100 ml/hr -Repeat urine sodium/osmolality -Repeat BMP in AM  Hypothyroidism -Continue Synthroid  Dyslipidemia -Continue Lipitor     DVT prophylaxis: Per orthopedic surgery: Aspirin Code Status:   Code Status: Full Code Family Communication: Husband at bedside Disposition Plan: Discharge to SNF once bed is available   Consultants:  Orthopedic surgery  Procedures:  ORIF (09/05/2021)  Antimicrobials: None    Subjective: Patient states she feels "medium" today. She does state that overall, feels better than yesterday. Plan to work with physical therapy today.  Objective: BP (!) 183/78 (BP Location: Right Arm)    Pulse 89    Temp 98.2 F (36.8 C) (Oral)    Resp 19    Ht 5\' 1"  (1.549  m)    Wt 47 kg    LMP 07/07/1997    SpO2 98%    BMI 19.58 kg/m   Examination:  General exam: Appears calm and comfortable Respiratory system: Clear to auscultation. Respiratory effort normal. Cardiovascular system: S1 & S2 heard, RRR. Gastrointestinal system: Abdomen is nondistended, soft and nontender. No organomegaly or masses felt. Normal bowel sounds heard. Central nervous system: Alert and oriented. No focal neurological deficits. Musculoskeletal: No edema. No calf tenderness. Left thigh incision sites with clean dressings and no underlying ecchymosis. Skin: No cyanosis. No rashes Psychiatry: Judgement and insight appear normal. Mood & affect appropriate.    Data Reviewed: I have personally reviewed following labs and imaging studies  CBC Lab Results  Component Value Date   WBC 6.2 09/07/2021   RBC 2.36 (L) 09/07/2021   HGB 8.1 (L) 09/07/2021   HCT 24.6 (L) 09/07/2021   MCV 104.2 (H) 09/07/2021   MCH 34.3 (H) 09/07/2021   PLT 197 09/07/2021   MCHC 32.9 09/07/2021   RDW 14.3 09/07/2021   LYMPHSABS 0.7 09/05/2021   MONOABS 0.5 09/05/2021   EOSABS 0.0 09/05/2021   BASOSABS 0.0 [id number]     Last metabolic panel Lab Results  Component Value Date   NA 128 (L) 09/07/2021   K 3.9 09/07/2021   CL 97 (L) 09/07/2021   CO2 25 09/07/2021   BUN 12 09/07/2021   CREATININE 0.35 (L) 09/07/2021   GLUCOSE 97 09/07/2021   GFRNONAA >60 09/07/2021   GFRAA 127 08/23/2008   CALCIUM 8.3 (L) 09/07/2021   PROT 6.6 07/08/2021   ALBUMIN 2.7 (L) 07/08/2021   BILITOT 0.7 07/08/2021   ALKPHOS 544 (H)  07/08/2021   AST 44 (H) 07/08/2021   ALT 40 07/08/2021   ANIONGAP 6 09/07/2021    GFR: Estimated Creatinine Clearance: 38.8 mL/min (A) (by C-G formula based on SCr of 0.35 mg/dL (L)).  Recent Results (from the past 240 hour(s))  Resp Panel by RT-PCR (Flu A&B, Covid) Nasopharyngeal Swab     Status: None   Collection Time: 09/05/21  1:50 AM   Specimen: Nasopharyngeal Swab;  Nasopharyngeal(NP) swabs in vial transport medium  Result Value Ref Range Status   SARS Coronavirus 2 by RT PCR NEGATIVE NEGATIVE Final    Comment: (NOTE) SARS-CoV-2 target nucleic acids are NOT DETECTED.  The SARS-CoV-2 RNA is generally detectable in upper respiratory specimens during the acute phase of infection. The lowest concentration of SARS-CoV-2 viral copies this assay can detect is 138 copies/mL. A negative result does not preclude SARS-Cov-2 infection and should not be used as the sole basis for treatment or other patient management decisions. A negative result may occur with  improper specimen collection/handling, submission of specimen other than nasopharyngeal swab, presence of viral mutation(s) within the areas targeted by this assay, and inadequate number of viral copies(<138 copies/mL). A negative result must be combined with clinical observations, patient history, and epidemiological information. The expected result is Negative.  Fact Sheet for Patients:  EntrepreneurPulse.com.au  Fact Sheet for Healthcare Providers:  IncredibleEmployment.be  This test is no t yet approved or cleared by the [place] FDA and  has been authorized for detection and/or diagnosis of SARS-CoV-2 by FDA under an Emergency Use Authorization (EUA). This EUA will remain  in effect (meaning this test can be used) for the duration of the COVID-19 declaration under Section 564(b)(1) of the Act, 21 U.S.C.section 360bbb-3(b)(1), unless the authorization is terminated  or revoked sooner.       Influenza A by PCR NEGATIVE NEGATIVE Final   Influenza B by PCR NEGATIVE NEGATIVE Final    Comment: (NOTE) The Xpert Xpress SARS-CoV-2/FLU/RSV plus assay is intended as an aid in the diagnosis of influenza from Nasopharyngeal swab specimens and should not be used as a sole basis for treatment. Nasal washings and aspirates are unacceptable for Xpert Xpress  SARS-CoV-2/FLU/RSV testing.  Fact Sheet for Patients: EntrepreneurPulse.com.au  Fact Sheet for Healthcare Providers: IncredibleEmployment.be  This test is not yet approved or cleared by the [place] FDA and has been authorized for detection and/or diagnosis of SARS-CoV-2 by FDA under an Emergency Use Authorization (EUA). This EUA will remain in effect (meaning this test can be used) for the duration of the COVID-19 declaration under Section 564(b)(1) of the Act, 21 U.S.C. section 360bbb-3(b)(1), unless the authorization is terminated or revoked.  Performed at [facility], [place] [address] [place], [place] [address]   Surgical pcr screen     Status: None   Collection Time: 09/05/21 12:54 PM   Specimen: Nasal Mucosa; Nasal Swab  Result Value Ref Range Status   MRSA, PCR NEGATIVE NEGATIVE Final   Staphylococcus aureus NEGATIVE NEGATIVE Final    Comment: (NOTE) The Xpert SA Assay (FDA approved for NASAL specimens in patients 67 years of age and older), is one component of a comprehensive surveillance program. It is not intended to diagnose infection nor to guide or monitor treatment. Performed at [facility], [place] [address] [place], [place] [address]       Radiology Studies: DG C-Arm 1-60 Min-No Report  Result Date: 09/05/2021 Fluoroscopy was utilized by the requesting physician.  No radiographic interpretation.  DG FEMUR MIN 2 VIEWS LEFT  Result Date: 09/05/2021 CLINICAL DATA:  Fluoroscopic assistance for internal fixation of intertrochanteric fracture of left femur EXAM: LEFT FEMUR 2 VIEWS COMPARISON:  09/04/2021 FINDINGS: There is interval reduction and internal fixation of intertrochanteric fracture of left femur with intramedullary rod. Fluoroscopic time was 53 seconds. Radiation dose is 7.06 mGy. IMPRESSION: Fluoroscopic assistance was provided for internal fixation of intertrochanteric  fracture of left femur. Electronically Signed   By: Elmer Picker M.D.   On: 09/05/2021 18:25      LOS: 2 days    Cordelia Poche, MD [facility] 09/07/2021, 9:38 AM   If 7PM-7AM, please contact night-coverage [email or web address]

## 2021-09-07 NOTE — Progress Notes (Signed)
Patient ID: Natasha Harper, female   DOB: [birth year], 87 y.o.   MRN: [id number] ?I did change the patient's left hip dressing at the bedside.  Her incisions look good.  She will certainly need skilled nursing placement after this acute hospitalization. ?

## 2021-09-07 NOTE — Evaluation (Signed)
Occupational Therapy Evaluation Patient Details Name: Natasha Harper MRN: [id number] DOB: 1934/09/14 Today's Date: 09/07/2021   History of Present Illness 87 y.o. admitted with a fall, Dx of complex  L IT hip fx, s/p ORIF on 09/05/21. PMH includes recent admission 07/04/21 for falls, T1 & T6 compression fractures, s/p KP for T6 (T1 unsuccessful) 07/11/21.   Clinical Impression   Patient is a 87 year old female who was admitted for above. Patient was living at home with husband prior level. Currently, patient is +2 to transfer with physical assist for balance and weight shifting of RLE with RW. Patient is TD for LB tasks and min A for UB with increased time. Patient was noted to have increased pain, decreased safety awareness, decreased sitting balance, decreased functional activity tolerance impacting participation in ADLs.Patient would continue to benefit from skilled OT services at this time while admitted and after d/c to address noted deficits in order to improve overall safety and independence in ADLs.        Recommendations for follow up therapy are one component of a multi-disciplinary discharge planning process, led by the attending physician.  Recommendations may be updated based on patient status, additional functional criteria and insurance authorization.   Follow Up Recommendations  Skilled nursing-short term rehab (<3 hours/day)    Assistance Recommended at Discharge Frequent or constant Supervision/Assistance  Patient can return home with the following Two people to help with walking and/or transfers;Two people to help with bathing/dressing/bathroom;Direct supervision/assist for medications management;Help with stairs or ramp for entrance;Assist for transportation;Direct supervision/assist for financial management;Assistance with cooking/housework    Functional Status Assessment  Patient has had a recent decline in their functional status and demonstrates the ability to make  significant improvements in function in a reasonable and predictable amount of time.  Equipment Recommendations  None recommended by OT    Recommendations for Other Services       Precautions / Restrictions Precautions Precautions: Fall Precaution Comments: multiple falls in past 6 months (spouse reports 4 hospitalizations for falls in 7 months); admission 07/04/21 for falls/compression fxs Restrictions Weight Bearing Restrictions: No      Mobility Bed Mobility Overal bed mobility: Needs Assistance             General bed mobility comments: patient was sitting on edge of bed with PT at start of session.    Transfers                          Balance Overall balance assessment: Needs assistance Sitting-balance support: Feet supported, Bilateral upper extremity supported Sitting balance-[name]: Poor     Standing balance support: Bilateral upper extremity supported, During functional activity, Reliant on assistive device for balance Standing balance-[name]: Poor                             ADL either performed or assessed with clinical judgement   ADL Overall ADL's : Needs assistance/impaired Eating/Feeding: Set up;Sitting   Grooming: Wash/dry face;Oral care;Sitting Grooming Details (indicate cue type and reason): with offloading sitting position in chair with inability to correct with increased pain. patient declined to comb hair at this time was provided with comb to work on tangles in hair Upper Body Bathing: Minimal assistance;Sitting Upper Body Bathing Details (indicate cue type and reason): in recliner with lateral lean to R Lower Body Bathing: Moderate assistance;Sit to/from stand;Sitting/lateral leans   Upper Body Dressing :  Minimal assistance;Sitting Upper Body Dressing Details (indicate cue type and reason): in recliner Lower Body Dressing: Total assistance Lower Body Dressing Details (indicate cue type and reason):  socks Toilet Transfer: +2 for physical assistance;+2 for safety/equipment;Moderate assistance Toilet Transfer Details (indicate cue type and reason): with increased time and physical assist to weight shift to advance RLE Toileting- Clothing Manipulation and Hygiene: Total assistance;Sit to/from stand       Functional mobility during ADLs: +2 for safety/equipment;+2 for physical assistance       Vision Ability to See in Adequate Light: 1 Impaired Patient Visual Report: No change from baseline       Perception     Praxis      Pertinent Vitals/Pain Pain Assessment Pain Assessment: Faces Faces Pain Scale: Hurts whole lot Pain Location: L hip and back from recent compression fxs Pain Descriptors / Indicators: Grimacing, Guarding Pain Intervention(s): Limited activity within patient's tolerance, Monitored during session, Premedicated before session, Repositioned     Hand Dominance Right   Extremity/Trunk Assessment Upper Extremity Assessment Upper Extremity Assessment: Generalized weakness (ROM WFL. MMT 3+/5)   Lower Extremity Assessment Lower Extremity Assessment: Defer to PT evaluation   Cervical / Trunk Assessment Cervical / Trunk Assessment: Normal   Communication Communication Communication: HOH   Cognition Arousal/Alertness: Awake/alert Behavior During Therapy: WFL for tasks assessed/performed, Anxious Overall Cognitive Status: Within Functional Limits for tasks assessed                                       General Comments       Exercises     Shoulder Instructions      Home Living Family/patient expects to be discharged to:: Private residence Living Arrangements: Spouse/significant other Available Help at Discharge: Family;Available 24 hours/day Type of Home: House Home Access: Level entry     Home Layout: Two level;Able to live on main level with bedroom/bathroom     Bathroom Shower/Tub: Producer, television/film/video:  Standard     Home Equipment: Agricultural consultant (2 wheels);Shower seat;Hand held shower head   Additional Comments: spouse has a hernia and a "bad knee" so can provide only limited assistance      Prior Functioning/Environment Prior Level of Function : Needs assist             Mobility Comments: pt requiring supervision for mobility, uses RW for short distances (limited by pain) ADLs Comments: pt's spouse reports pt needs assist with wash up, per pt would like to be as independent as possible        OT Problem List: Decreased activity tolerance;Impaired balance (sitting and/or standing);Decreased safety awareness;Pain;Decreased knowledge of precautions;Decreased knowledge of use of DME or AE      OT Treatment/Interventions: Self-care/ADL training;Therapeutic exercise;Neuromuscular education;DME and/or AE instruction;Energy conservation;Therapeutic activities;Balance training;Patient/family education    OT Goals(Current goals can be found in the care plan section) Acute Rehab OT Goals Patient Stated Goal: to get better OT Goal Formulation: With patient Time For Goal Achievement: 09/21/21 Potential to Achieve Goals: Good  OT Frequency: Min 2X/week    Co-evaluation              AM-PAC OT "6 Clicks" Daily Activity     Outcome Measure Help from another person eating meals?: A Little Help from another person taking care of personal grooming?: A Little Help from another person toileting, which includes using toliet, bedpan, or urinal?:  Total Help from another person bathing (including washing, rinsing, drying)?: A Lot Help from another person to put on and taking off regular upper body clothing?: A Lot Help from another person to put on and taking off regular lower body clothing?: A Lot 6 Click Score: 13   End of Session Equipment Utilized During Treatment: Gait belt;Rolling walker (2 wheels) Nurse Communication: Mobility status  Activity Tolerance: Patient tolerated  treatment well Patient left: in chair;with call bell/phone within reach;with chair alarm set;with family/visitor present  OT Visit Diagnosis: Unsteadiness on feet (R26.81);Other abnormalities of gait and mobility (R26.89);Muscle weakness (generalized) (M62.81)                Time: 1238-1300 OT Time Calculation (min): 22 min Charges:  OT General Charges $OT Visit: 1 Visit OT Evaluation $OT Eval Moderate Complexity: 1 Mod  Sharyn Blitz OTR/L, MS Acute Rehabilitation Department Office# [phone] Pager# [phone]   Ardyth Harps 09/07/2021, 3:35 PM

## 2021-09-07 NOTE — Discharge Instructions (Addendum)
Abel Presto, ? ?You were in the hospital with a left hip fracture. This was repaired by the surgeon. While you were here, you developed some low sodium which has improved with salt tablets. You also developed some urinary retention and are recommended to follow-up with the urologist. ? ? ?May be up with assistance. ?May attempt full weight bearing as tolerated on the left hip. ?The current dressing can get wet in the shower daily. ?

## 2021-09-08 DIAGNOSIS — R338 Other retention of urine: Secondary | ICD-10-CM

## 2021-09-08 LAB — BASIC METABOLIC PANEL
Anion gap: 5 (ref 5–15)
Anion gap: 7 (ref 5–15)
BUN: 11 mg/dL (ref 8–23)
BUN: 12 mg/dL (ref 8–23)
CO2: 24 mmol/L (ref 22–32)
CO2: 25 mmol/L (ref 22–32)
Calcium: 8 mg/dL — ABNORMAL LOW (ref 8.9–10.3)
Calcium: 8 mg/dL — ABNORMAL LOW (ref 8.9–10.3)
Chloride: 98 mmol/L (ref 98–111)
Chloride: 100 mmol/L (ref 98–111)
Creatinine, Ser: 0.35 mg/dL — ABNORMAL LOW (ref 0.44–1.00)
Creatinine, Ser: <0.3 mg/dL — ABNORMAL LOW (ref 0.44–1.00)
GFR, Estimated: >60 mL/min (ref >60)
Glucose, Bld: 99 mg/dL (ref 70–99)
Glucose, Bld: 125 mg/dL — ABNORMAL HIGH (ref 70–99)
Potassium: 3.6 mmol/L (ref 3.5–5.1)
Potassium: 3.5 mmol/L (ref 3.5–5.1)
Sodium: 127 mmol/L — ABNORMAL LOW (ref 135–145)
Sodium: 132 mmol/L — ABNORMAL LOW (ref 135–145)

## 2021-09-08 LAB — CBC
HCT: 20.6 % — ABNORMAL LOW (ref 36.0–46.0)
Hemoglobin: 6.9 g/dL — CL (ref 12.0–15.0)
MCH: 35 pg — ABNORMAL HIGH (ref 26.0–34.0)
MCHC: 33.5 g/dL (ref 30.0–36.0)
MCV: 104.6 fL — ABNORMAL HIGH (ref 80.0–100.0)
Platelets: 170 10*3/uL (ref 150–400)
RBC: 1.97 MIL/uL — ABNORMAL LOW (ref 3.87–5.11)
RDW: 14.1 % (ref 11.5–15.5)
WBC: 4.3 10*3/uL (ref 4.0–10.5)
nRBC: 0 % (ref 0.0–0.2)

## 2021-09-08 LAB — HEMOGLOBIN AND HEMATOCRIT, BLOOD
HCT: 27.5 % — ABNORMAL LOW (ref 36.0–46.0)
Hemoglobin: 9.3 g/dL — ABNORMAL LOW (ref 12.0–15.0)

## 2021-09-08 LAB — TSH: TSH: 0.913 u[IU]/mL (ref 0.350–4.500)

## 2021-09-08 LAB — OSMOLALITY, URINE: Osmolality, Ur: 286 mOsm/kg — ABNORMAL LOW (ref 300–900)

## 2021-09-08 LAB — SODIUM, URINE, RANDOM: Sodium, Ur: 17 mmol/L

## 2021-09-08 LAB — PREPARE RBC (CROSSMATCH)

## 2021-09-08 MED ORDER — SODIUM CHLORIDE 1 G PO TABS
1.0000 g | ORAL_TABLET | Freq: Three times a day (TID) | ORAL | Status: AC
  Administered 2021-09-08 – 2021-09-10 (×6): 1 g via ORAL
  Filled 2021-09-08 (×6): qty 1

## 2021-09-08 MED ORDER — SODIUM CHLORIDE 0.9% IV SOLUTION: Freq: Once | INTRAVENOUS | Status: AC

## 2021-09-08 NOTE — Progress Notes (Signed)
?  Subjective: ?Natasha Harper is a 87 y.o. female s/p left hip IM nail.  They are POD 3.  Pt's pain is controlled.  She states pain is really controlled with Tylenol.  Denies any chest pain or shortness of breath.  Does complain of urinary retention and had to have in and out cath yesterday.  Order was placed for Foley catheter by Dr. Lonny Prude.  Denies any calf pain.  Hip pain continues to bother her but is improving steadily.  She denies any lightheadedness or dizziness.  Hemoglobin is found to be 6.9 today and she has not had transfusion yet..   ? ?Objective: ?Vital signs in last 24 hours: ?Temp:  [98.2 ?F (36.8 ?C)-98.6 ?F (37 ?C)] 98.6 ?F (37 ?C) (03/05 1142) ?Pulse Rate:  [83-97] 90 (03/05 1142) ?Resp:  [16-18] 18 (03/05 1142) ?BP: (109-148)/(66-78) 148/66 (03/05 1142) ?SpO2:  [93 %-98 %] 98 % (03/05 1142) ? ?Intake/Output from previous day: ?03/04 0701 - 03/05 0700 ?In: 360 [P.O.:360] ?Out: Antoine [id number] ?Intake/Output this shift: ?Total I/O ?In: 500 [I.V.:250; Blood:250] ?Out: -  ? ?Exam: ? ?No gross blood or drainage overlying the dressing ?2+ DP pulse ?Sensation intact distally in the left foot ?Able to dorsiflex and plantarflex the left foot ?No significant calf tenderness ? ? ?Labs: ?Recent Labs  ?  09/06/21 ?1349 09/07/21 ?0406 09/08/21 ?[id number]  ?HGB 8.1* 8.1* 6.9*  ? ?Recent Labs  ?  09/07/21 ?0406 09/08/21 ?[id number]  ?WBC 6.2 4.3  ?RBC 2.36* 1.97*  ?HCT 24.6* 20.6*  ?PLT 197 170  ? ?Recent Labs  ?  09/07/21 ?0406 09/08/21 ?[id number]  ?NA 128* 127*  ?K 3.9 3.6  ?CL 97* 98  ?CO2 25 24  ?BUN 12 11  ?CREATININE 0.35* 0.35*  ?GLUCOSE 97 99  ?CALCIUM 8.3* 8.0*  ? ?No results for input(s): LABPT, INR in the last 72 hours. ? ?Assessment/Plan: ?Pt is POD 3 s/p left hip IM nail.   ? -Disposition pending medical team clearance and decision ? -Weightbearing as tolerated left lower extremity with walker ? -DVT Prophylaxis: Aspirin/SCDs for DVT prophylaxis ?  ? ? ?Gerrianne Scale Mikayla Chiusano ?09/08/2021, 1:03 PM  ? ? ?   ?

## 2021-09-08 NOTE — Progress Notes (Signed)
PT Cancellation Note ? ?Patient Details ?Name: Natasha Harper ?MRN: [id number] ?DOB: 1935/02/20 ? ? ?Cancelled Treatment:    Reason Eval/Treat Not Completed: Medical issues which prohibited therapy. Hgb 6.9 will defer PT  ? ? ?Ilda Laskin ?09/08/2021, 8:58 AM ?

## 2021-09-08 NOTE — Progress Notes (Signed)
Patient wasn't able to void. Bladder scan showed 800 mL. On-call provider notified. In & Out cath order obtained and performed. 900 mL drained.  ?

## 2021-09-08 NOTE — Progress Notes (Signed)
HgB of 6.9 paged to On-call NP Audrea Muscat.  ?

## 2021-09-08 NOTE — Progress Notes (Signed)
? ?PROGRESS NOTE ? ? ? ?Natasha Harper  [id number] DOB: 20-Jun-1935 DOA: 09/04/2021 ?PCP: Burnis Medin, MD ? ? ?Brief Narrative: ?Natasha Harper is a 87 y.o. female with a history of hypertension, hyperlipidemia, CAD, hypothyroidism. Patient presented secondary to a fall and found to have a left hip fracture. Orthopedic surgery consulted and performed ORIF on 3/2. Plan for SNF. ? ? ?Assessment and Plan: ?* Hip fracture ([place]) ?Secondary to fall. Orthopedic surgery consulted and performed ORIF on 3/2. Weight bearing as tolerated. PT recommending SNF. ?-Orthopedic surgery recommendations: WBAT, aspirin for DVT prophylaxis ?-PT/OT ? ?Acute urinary retention ?Post-op. Possibly complicated by narcotics.  ?-Insert foley ?-Urine culture ? ?Fall at home, initial encounter ?Trip and fall. No loss of consciousness. ? ?Primary hypertension ?Patient is prescribed amlodipine (listed as not taking) and losartan (listed as not taking) as an outpatient which were held on admission. Uncontrolled blood pressure. Patient declined amlodipine on 3/3. ?-Continue amlodipine ? ?Anemia ?Acute blood loss anemia on chronic anemia. Acute from fracture and surgery. Hemoglobin down to 6.9 on CBC this morning. 1 unit of PRBC ordered. No obvious evidence of acute hemorrhage noted. ?-Daily CBC ? ?Hyponatremia ?Mild. Asymptomatic. Likely hypovolemic. Started on IV fluids with no improvement. ?-Stop IV fluids ?-Salt tablets ?-AM Cortisol and TSH ?-BMP q12 hours ? ?Hypothyroidism ?-Continue Synthroid ? ?Dyslipidemia ?-Continue Lipitor ? ? ? ?DVT prophylaxis: Per orthopedic surgery: Aspirin ?Code Status:   Code Status: Full Code ?Family Communication: Husband at bedside ?Disposition Plan: Discharge to SNF once bed is available in addition to stable hemoglobin and improvement of sodium ? ? ?Consultants:  ?Orthopedic surgery ? ?Procedures:  ?ORIF (09/05/2021) ? ?Antimicrobials: ?None  ? ? ?Subjective: ?Patient reports a rough night last night. Trouble  using the bed pan. ? ?Objective: ?BP (!) 148/66   Pulse 90   Temp 98.6 ?F (37 ?C) (Oral)   Resp 18   Ht 5\' 1"  (1.549 m)   Wt 47 kg   LMP 07/07/1997   SpO2 98%   BMI 19.58 kg/m?  ? ?Examination: ? ?General exam: Appears calm and comfortable ?Respiratory system: Clear to auscultation. Respiratory effort normal. ?Cardiovascular system: S1 & S2 heard, RRR. ?Gastrointestinal system: Abdomen is nondistended, soft and nontender. No organomegaly or masses felt. Normal bowel sounds heard. ?Central nervous system: Alert and oriented. No focal neurological deficits. ?Musculoskeletal:  No calf tenderness ?Skin: No cyanosis. No rashes ?Psychiatry: Judgement and insight appear normal. Mood & affect appropriate.  ? ? ?Data Reviewed: I have personally reviewed following labs and imaging studies ? ?CBC ?Lab Results  ?Component Value Date  ? WBC 4.3 09/08/2021  ? RBC 1.97 (L) 09/08/2021  ? HGB 6.9 (LL) 09/08/2021  ? HCT 20.6 (L) 09/08/2021  ? MCV 104.6 (H) 09/08/2021  ? MCH 35.0 (H) 09/08/2021  ? PLT 170 09/08/2021  ? MCHC 33.5 09/08/2021  ? RDW 14.1 09/08/2021  ? LYMPHSABS 0.7 09/05/2021  ? MONOABS 0.5 09/05/2021  ? EOSABS 0.0 09/05/2021  ? BASOSABS 0.0 09/05/2021  ? ? ? ?Last metabolic panel ?Lab Results  ?Component Value Date  ? NA 127 (L) 09/08/2021  ? K 3.6 09/08/2021  ? CL 98 09/08/2021  ? CO2 24 09/08/2021  ? BUN 11 09/08/2021  ? CREATININE 0.35 (L) 09/08/2021  ? GLUCOSE 99 09/08/2021  ? GFRNONAA >60 09/08/2021  ? GFRAA 127 08/23/2008  ? CALCIUM 8.0 (L) 09/08/2021  ? PROT 6.6 07/08/2021  ? ALBUMIN 2.7 (L) 07/08/2021  ? BILITOT 0.7 07/08/2021  ? ALKPHOS 544 (H)  07/08/2021  ? AST 44 (H) 07/08/2021  ? ALT 40 07/08/2021  ? ANIONGAP 5 09/08/2021  ? ? ?GFR: ?Estimated Creatinine Clearance: 38.8 mL/min (A) (by C-G formula based on SCr of 0.35 mg/dL (L)). ? ?Recent Results (from the past 240 hour(s))  ?Resp Panel by RT-PCR (Flu A&B, Covid) Nasopharyngeal Swab     Status: None  ? Collection Time: 09/05/21  1:50 AM  ? Specimen:  Nasopharyngeal Swab; Nasopharyngeal(NP) swabs in vial transport medium  ?Result Value Ref Range Status  ? SARS Coronavirus 2 by RT PCR NEGATIVE NEGATIVE Final  ?  Comment: (NOTE) ?SARS-CoV-2 target nucleic acids are NOT DETECTED. ? ?The SARS-CoV-2 RNA is generally detectable in upper respiratory ?specimens during the acute phase of infection. The lowest ?concentration of SARS-CoV-2 viral copies this assay can detect is ?138 copies/mL. A negative result does not preclude SARS-Cov-2 ?infection and should not be used as the sole basis for treatment or ?other patient management decisions. A negative result may occur with  ?improper specimen collection/handling, submission of specimen other ?than nasopharyngeal swab, presence of viral mutation(s) within the ?areas targeted by this assay, and inadequate number of viral ?copies(<138 copies/mL). A negative result must be combined with ?clinical observations, patient history, and epidemiological ?information. The expected result is Negative. ? ?Fact Sheet for Patients:  ?EntrepreneurPulse.com.au ? ?Fact Sheet for Healthcare Providers:  ?IncredibleEmployment.be ? ?This test is no t yet approved or cleared by the [place] FDA and  ?has been authorized for detection and/or diagnosis of SARS-CoV-2 by ?FDA under an Emergency Use Authorization (EUA). This EUA will remain  ?in effect (meaning this test can be used) for the duration of the ?COVID-19 declaration under Section 564(b)(1) of the Act, 21 ?U.S.C.section 360bbb-3(b)(1), unless the authorization is terminated  ?or revoked sooner.  ? ? ?  ? Influenza A by PCR NEGATIVE NEGATIVE Final  ? Influenza B by PCR NEGATIVE NEGATIVE Final  ?  Comment: (NOTE) ?The Xpert Xpress SARS-CoV-2/FLU/RSV plus assay is intended as an aid ?in the diagnosis of influenza from Nasopharyngeal swab specimens and ?should not be used as a sole basis for treatment. Nasal washings and ?aspirates are unacceptable for  Xpert Xpress SARS-CoV-2/FLU/RSV ?testing. ? ?Fact Sheet for Patients: ?EntrepreneurPulse.com.au ? ?Fact Sheet for Healthcare Providers: ?IncredibleEmployment.be ? ?This test is not yet approved or cleared by the [place] FDA and ?has been authorized for detection and/or diagnosis of SARS-CoV-2 by ?FDA under an Emergency Use Authorization (EUA). This EUA will remain ?in effect (meaning this test can be used) for the duration of the ?COVID-19 declaration under Section 564(b)(1) of the Act, 21 U.S.C. ?section 360bbb-3(b)(1), unless the authorization is terminated or ?revoked. ? ?Performed at [facility], [address] ?[place], [place] [address] ?  ?Surgical pcr screen     Status: None  ? Collection Time: 09/05/21 12:54 PM  ? Specimen: Nasal Mucosa; Nasal Swab  ?Result Value Ref Range Status  ? MRSA, PCR NEGATIVE NEGATIVE Final  ? Staphylococcus aureus NEGATIVE NEGATIVE Final  ?  Comment: (NOTE) ?The Xpert SA Assay (FDA approved for NASAL specimens in patients 77 ?years of age and older), is one component of a comprehensive ?surveillance program. It is not intended to diagnose infection nor to ?guide or monitor treatment. ?Performed at [facility], [address] ?[place], [place] [address] ?  ?  ? ? ?Radiology Studies: ?No results found. ? ? ? LOS: 3 days  ? ? ?Cordelia Poche, MD ?[facility] ?09/08/2021, 12:31 PM ? ? ?  If 7PM-7AM, please contact night-coverage ?[email or web address] ? ?

## 2021-09-08 NOTE — Assessment & Plan Note (Addendum)
Foley inserted perioperatively. Upon attempt to remove, patient developed significant urinary retention. Possibly complicated by narcotics. Foley re-inserted on 3/5. Urine culture with no growth. Decision to discharge with a foley catheter and recommend outpatient urology follow-up for voiding trial. ?

## 2021-09-09 LAB — URINE CULTURE: Culture: NO GROWTH

## 2021-09-09 LAB — BASIC METABOLIC PANEL
Anion gap: 5 (ref 5–15)
BUN: 11 mg/dL (ref 8–23)
CO2: 25 mmol/L (ref 22–32)
Calcium: 8.2 mg/dL — ABNORMAL LOW (ref 8.9–10.3)
Chloride: 100 mmol/L (ref 98–111)
Creatinine, Ser: <0.3 mg/dL — ABNORMAL LOW (ref 0.44–1.00)
Glucose, Bld: 93 mg/dL (ref 70–99)
Potassium: 3.5 mmol/L (ref 3.5–5.1)
Sodium: 130 mmol/L — ABNORMAL LOW (ref 135–145)

## 2021-09-09 LAB — BPAM RBC
Blood Product Expiration Date: 202303252359
ISSUE DATE / TIME: 202303050916
Unit Type and Rh: 6200

## 2021-09-09 LAB — CORTISOL-AM, BLOOD: Cortisol - AM: 15.5 ug/dL (ref 6.7–22.6)

## 2021-09-09 LAB — TYPE AND SCREEN
ABO/RH(D): A POS
Antibody Screen: NEGATIVE
Unit division: 0

## 2021-09-09 LAB — CBC
HCT: 26.9 % — ABNORMAL LOW (ref 36.0–46.0)
Hemoglobin: 9.2 g/dL — ABNORMAL LOW (ref 12.0–15.0)
MCH: 33.9 pg (ref 26.0–34.0)
MCHC: 34.2 g/dL (ref 30.0–36.0)
MCV: 99.3 fL (ref 80.0–100.0)
Platelets: 206 10*3/uL (ref 150–400)
RBC: 2.71 MIL/uL — ABNORMAL LOW (ref 3.87–5.11)
RDW: 16.5 % — ABNORMAL HIGH (ref 11.5–15.5)
WBC: 4.5 10*3/uL (ref 4.0–10.5)
nRBC: 0 % (ref 0.0–0.2)

## 2021-09-09 LAB — SODIUM: Sodium: 131 mmol/L — ABNORMAL LOW (ref 135–145)

## 2021-09-09 MED ORDER — SENNOSIDES-DOCUSATE SODIUM 8.6-50 MG PO TABS
2.0000 | ORAL_TABLET | Freq: Two times a day (BID) | ORAL | Status: DC
  Administered 2021-09-09 – 2021-09-10 (×2): 2 via ORAL
  Filled 2021-09-09 (×2): qty 2

## 2021-09-09 NOTE — Progress Notes (Signed)
Physical Therapy Treatment ?Patient Details ?Name: Natasha Harper ?MRN: [id number] ?DOB: 06-03-1935 ?Today's Date: 09/09/2021 ? ? ?History of Present Illness 87 y.o. admitted with a fall, Dx of complex  L IT hip fx, s/p ORIF on 09/05/21. PMH includes recent admission 07/04/21 for falls, T1 & T6 compression fractures, s/p KP for T6 (T1 unsuccessful) 07/11/21. ? ?  ?PT Comments  ? ? Pt progressing toward PT goals. Amb ~ 6' with RW, min to mod assist for gait and transfers. Pt is fearful of falling, frequent cues to decr anxiety and focus on mobility tasks. Pain well controlled today, no dizziness. Continue to recommend SNF post acute to maximize independence and safety; pt with multiple recent falls and hospital admissions.  ?Continue PT POC  ?Recommendations for follow up therapy are one component of a multi-disciplinary discharge planning process, led by the attending physician.  Recommendations may be updated based on patient status, additional functional criteria and insurance authorization. ? ?Follow Up Recommendations ? Skilled nursing-short term rehab (<3 hours/day) ?  ?  ?Assistance Recommended at Discharge Frequent or constant Supervision/Assistance  ?Patient can return home with the following Assist for transportation;Help with stairs or ramp for entrance;A lot of help with bathing/dressing/bathroom;A lot of help with walking and/or transfers;Assistance with cooking/housework ?  ?Equipment Recommendations ? None recommended by PT  ?  ?Recommendations for Other Services   ? ? ?  ?Precautions / Restrictions Precautions ?Precautions: Fall ?Precaution Comments: multiple falls in past 6 months (spouse reports 4 hospitalizations for falls in 7 months); admission 07/04/21 for falls/compression fxs ?Restrictions ?Weight Bearing Restrictions: No  ?  ? ?Mobility ? Bed Mobility ?Overal bed mobility: Needs Assistance ?Bed Mobility: Supine to Sit ?  ?Sidelying to sit: Min assist, Mod assist, HOB elevated ?  ?  ?  ?General bed  mobility comments: incr time, assist to elevate trunk and progress LLE off bed, cues to roll to R with HOB elevated, use of rail ?  ? ?Transfers ?Overall transfer level: Needs assistance ?Equipment used: Rolling walker (2 wheels) ?Transfers: Sit to/from Stand ?Sit to Stand: Min assist ?  ?  ?  ?  ?  ?General transfer comment: assist to rise and transition to RW, cues for hand placement and to power up with LEs. wide BOS initially ?  ? ?Ambulation/Gait ?Ambulation/Gait assistance: Min assist, Mod assist ?Gait Distance (Feet): 6 Feet ?Assistive device: Rolling walker (2 wheels) ?Gait Pattern/deviations: Step-to pattern ?  ?  ?  ?General Gait Details: multi-modal cues for RW position, sequence, use of UEs, breathing--pt is anxious regarding falls. assist to balance while wt shifting and position RW ? ? ?Stairs ?  ?  ?  ?  ?  ? ? ?Wheelchair Mobility ?  ? ?Modified Rankin (Stroke Patients Only) ?  ? ? ?  ?Balance   ?Sitting-balance support: Single extremity supported, Feet supported ?Sitting balance-[name]: Fair ?  ?  ?Standing balance support: Bilateral upper extremity supported, During functional activity, Reliant on assistive device for balance ?Standing balance-[name]: Poor ?  ?  ?  ?  ?  ?  ?  ?  ?  ?  ?  ?  ?  ? ?  ?Cognition Arousal/Alertness: Awake/alert ?Behavior During Therapy: [facility] for tasks assessed/performed, Anxious ?Overall Cognitive Status: Within Functional Limits for tasks assessed ?  ?  ?  ?  ?  ?  ?  ?  ?  ?  ?  ?  ?  ?  ?  ?  ?  ?  ?  ? ?  ?  Exercises General Exercises - Lower Extremity ?Ankle Circles/Pumps: Both, 10 reps, AROM ? ?  ?General Comments   ?  ?  ? ?Pertinent Vitals/Pain Pain Assessment ?Pain Assessment: Faces ?Faces Pain Scale: Hurts little more ?Pain Location: L hip and back from recent compression fxs ?Pain Descriptors / Indicators: Grimacing, Guarding, Sore ?Pain Intervention(s): Limited activity within patient's tolerance, Monitored during session, Premedicated before  session, Repositioned  ? ? ?Home Living   ?  ?  ?  ?  ?  ?  ?  ?  ?  ?   ?  ?Prior Function    ?  ?  ?   ? ?PT Goals (current goals can now be found in the care plan section) Acute Rehab PT Goals ?Patient Stated Goal: rehab ?PT Goal Formulation: With patient/family ?Time For Goal Achievement: 09/20/21 ?Potential to Achieve Goals: Fair ?Progress towards PT goals: Progressing toward goals ? ?  ?Frequency ? ? ? Min 3X/week ? ? ? ?  ?PT Plan Current plan remains appropriate  ? ? ?Co-evaluation   ?  ?  ?  ?  ? ?  ?AM-PAC PT "6 Clicks" Mobility   ?Outcome Measure ? Help needed turning from your back to your side while in a flat bed without using bedrails?: A Lot ?Help needed moving from lying on your back to sitting on the side of a flat bed without using bedrails?: A Lot ?Help needed moving to and from a bed to a chair (including a wheelchair)?: A Lot ?Help needed standing up from a chair using your arms (e.g., wheelchair or bedside chair)?: A Lot ?Help needed to walk in hospital room?: A Lot ?Help needed climbing 3-5 steps with a railing? : Total ?6 Click Score: 11 ? ?  ?End of Session Equipment Utilized During Treatment: Gait belt ?Activity Tolerance: Patient tolerated treatment well;Patient limited by fatigue ?Patient left: in chair;with call bell/phone within reach;with chair alarm set;with family/visitor present ?Nurse Communication: Mobility status ?PT Visit Diagnosis: Difficulty in walking, not elsewhere classified (R26.2);Pain;Repeated falls (R29.6);History of falling (Z91.81) ?Pain - Right/Left: Left ?Pain - part of body: Hip ?  ? ? ?Time: [id number] ?PT Time Calculation (min) (ACUTE ONLY): 23 min ? ?Charges:  $Gait Training: 8-22 mins ?$Therapeutic Exercise: 8-22 mins          ?          ? Baxter Flattery, PT ? ?Acute Rehab Dept [facility]) [phone] ?Pager [phone] ? ?09/09/2021 ? ? ? ?Money Mckeithan ?09/09/2021, 12:27 PM ? ?

## 2021-09-09 NOTE — Progress Notes (Signed)
? ?PROGRESS NOTE ? ? ? ?Natasha Harper  [id number] DOB: 1934-10-16 DOA: 09/04/2021 ?PCP: Burnis Medin, MD ? ? ?Brief Narrative: ?Natasha Harper is a 87 y.o. female with a history of hypertension, hyperlipidemia, CAD, hypothyroidism. Patient presented secondary to a fall and found to have a left hip fracture. Orthopedic surgery consulted and performed ORIF on 3/2. Plan for SNF. ? ? ?Assessment and Plan: ?* Hip fracture ([place]) ?Secondary to fall. Orthopedic surgery consulted and performed ORIF on 3/2. Weight bearing as tolerated. PT recommending SNF. ?-Orthopedic surgery recommendations: WBAT, aspirin for DVT prophylaxis ? ?Acute urinary retention ?Post-op. Possibly complicated by narcotics. Foley inserted on 3/5. Urine culture pending ?-Continue foley catheter ?-Follow-up urine culture ? ?Fall at home, initial encounter ?Trip and fall. No loss of consciousness. ? ?Primary hypertension ?Patient is prescribed amlodipine (listed as not taking) and losartan (listed as not taking) as an outpatient which were held on admission. Uncontrolled blood pressure. Patient declined amlodipine on 3/3. ?-Continue amlodipine ? ?Anemia ?Acute blood loss anemia on chronic anemia. Acute from fracture and surgery. Hemoglobin down to 6.9 on CBC on 3/5. 1 unit of PRBC ordered and transfused. Post-transfusion hemoglobin of 9.3 > 9.2. No obvious evidence of acute hemorrhage noted. ?-Daily CBC ? ?Hyponatremia ?Mild. Asymptomatic. Likely hypovolemic. Started on IV fluids with no improvement. IV fluids held. Normal TSH. Cortisol is normal.  ?-Continue salt tablets ?-Recheck sodium this evening and BMP tomorrow morning ? ?Hypothyroidism ?-Continue Synthroid ? ?Dyslipidemia ?-Continue Lipitor ? ? ? ?DVT prophylaxis: Per orthopedic surgery: Aspirin ?Code Status:   Code Status: Full Code ?Family Communication: Husband at bedside ?Disposition Plan: Discharge to SNF once bed is available in addition to stable hemoglobin and improvement of  sodium; likely discharge in 1-2 days ? ? ?Consultants:  ?Orthopedic surgery ? ?Procedures:  ?ORIF (09/05/2021) ? ?Antimicrobials: ?None  ? ? ?Subjective: ?No issues this morning. Patient thinks Norco caused her to have bad dreams and to feel confused.  ? ?Objective: ?BP (!) 170/84 (BP Location: Right Arm)   Pulse 88   Temp 97.9 ?F (36.6 ?C) (Oral)   Resp 18   Ht 5\' 1"  (1.549 m)   Wt 47 kg   LMP 07/07/1997   SpO2 98%   BMI 19.58 kg/m?  ? ?Examination: ? ?General exam: Appears calm and comfortable ?Respiratory system: Clear to auscultation. Respiratory effort normal. ?Cardiovascular system: S1 & S2 heard, RRR. ?Gastrointestinal system: Abdomen is nondistended, soft and nontender. No organomegaly or masses felt. Normal bowel sounds heard. ?Central nervous system: Alert and oriented. No focal neurological deficits. ?Musculoskeletal: No edema. No calf tenderness ?Skin: No cyanosis. No rashes. No ecchymosis noted ?Psychiatry: Judgement and insight appear normal. Mood & affect appropriate.  ? ? ?Data Reviewed: I have personally reviewed following labs and imaging studies ? ?CBC ?Lab Results  ?Component Value Date  ? WBC 4.5 09/09/2021  ? RBC 2.71 (L) 09/09/2021  ? HGB 9.2 (L) 09/09/2021  ? HCT 26.9 (L) 09/09/2021  ? MCV 99.3 09/09/2021  ? MCH 33.9 09/09/2021  ? PLT 206 09/09/2021  ? MCHC 34.2 09/09/2021  ? RDW 16.5 (H) 09/09/2021  ? LYMPHSABS 0.7 09/05/2021  ? MONOABS 0.5 09/05/2021  ? EOSABS 0.0 09/05/2021  ? BASOSABS 0.0 09/05/2021  ? ? ? ?Last metabolic panel ?Lab Results  ?Component Value Date  ? NA 130 (L) 09/09/2021  ? K 3.5 09/09/2021  ? CL 100 09/09/2021  ? CO2 25 09/09/2021  ? BUN 11 09/09/2021  ? CREATININE <0.30 (L) 09/09/2021  ?  GLUCOSE 93 09/09/2021  ? GFRNONAA NOT CALCULATED 09/09/2021  ? GFRAA 127 08/23/2008  ? CALCIUM 8.2 (L) 09/09/2021  ? PROT 6.6 07/08/2021  ? ALBUMIN 2.7 (L) 07/08/2021  ? BILITOT 0.7 07/08/2021  ? ALKPHOS 544 (H) 07/08/2021  ? AST 44 (H) 07/08/2021  ? ALT 40 07/08/2021  ? ANIONGAP  5 09/09/2021  ? ? ?GFR: ?CrCl cannot be calculated (This lab value cannot be used to calculate CrCl because it is not a number: <0.30). ? ?Recent Results (from the past 240 hour(s))  ?Resp Panel by RT-PCR (Flu A&B, Covid) Nasopharyngeal Swab     Status: None  ? Collection Time: 09/05/21  1:50 AM  ? Specimen: Nasopharyngeal Swab; Nasopharyngeal(NP) swabs in vial transport medium  ?Result Value Ref Range Status  ? SARS Coronavirus 2 by RT PCR NEGATIVE NEGATIVE Final  ?  Comment: (NOTE) ?SARS-CoV-2 target nucleic acids are NOT DETECTED. ? ?The SARS-CoV-2 RNA is generally detectable in upper respiratory ?specimens during the acute phase of infection. The lowest ?concentration of SARS-CoV-2 viral copies this assay can detect is ?138 copies/mL. A negative result does not preclude SARS-Cov-2 ?infection and should not be used as the sole basis for treatment or ?other patient management decisions. A negative result may occur with  ?improper specimen collection/handling, submission of specimen other ?than nasopharyngeal swab, presence of viral mutation(s) within the ?areas targeted by this assay, and inadequate number of viral ?copies(<138 copies/mL). A negative result must be combined with ?clinical observations, patient history, and epidemiological ?information. The expected result is Negative. ? ?Fact Sheet for Patients:  ?EntrepreneurPulse.com.au ? ?Fact Sheet for Healthcare Providers:  ?IncredibleEmployment.be ? ?This test is no t yet approved or cleared by the [place] FDA and  ?has been authorized for detection and/or diagnosis of SARS-CoV-2 by ?FDA under an Emergency Use Authorization (EUA). This EUA will remain  ?in effect (meaning this test can be used) for the duration of the ?COVID-19 declaration under Section 564(b)(1) of the Act, 21 ?U.S.C.section 360bbb-3(b)(1), unless the authorization is terminated  ?or revoked sooner.  ? ? ?  ? Influenza A by PCR NEGATIVE NEGATIVE  Final  ? Influenza B by PCR NEGATIVE NEGATIVE Final  ?  Comment: (NOTE) ?The Xpert Xpress SARS-CoV-2/FLU/RSV plus assay is intended as an aid ?in the diagnosis of influenza from Nasopharyngeal swab specimens and ?should not be used as a sole basis for treatment. Nasal washings and ?aspirates are unacceptable for Xpert Xpress SARS-CoV-2/FLU/RSV ?testing. ? ?Fact Sheet for Patients: ?EntrepreneurPulse.com.au ? ?Fact Sheet for Healthcare Providers: ?IncredibleEmployment.be ? ?This test is not yet approved or cleared by the [place] FDA and ?has been authorized for detection and/or diagnosis of SARS-CoV-2 by ?FDA under an Emergency Use Authorization (EUA). This EUA will remain ?in effect (meaning this test can be used) for the duration of the ?COVID-19 declaration under Section 564(b)(1) of the Act, 21 U.S.C. ?section 360bbb-3(b)(1), unless the authorization is terminated or ?revoked. ? ?Performed at [facility], [address] ?[place], [place] [address] ?  ?Surgical pcr screen     Status: None  ? Collection Time: 09/05/21 12:54 PM  ? Specimen: Nasal Mucosa; Nasal Swab  ?Result Value Ref Range Status  ? MRSA, PCR NEGATIVE NEGATIVE Final  ? Staphylococcus aureus NEGATIVE NEGATIVE Final  ?  Comment: (NOTE) ?The Xpert SA Assay (FDA approved for NASAL specimens in patients 32 ?years of age and older), is one component of a comprehensive ?surveillance program. It is not intended to diagnose infection nor to ?  guide or monitor treatment. ?Performed at [facility], [address] ?[place], [place] [address] ?  ?  ? ? ?Radiology Studies: ?No results found. ? ? ? LOS: 4 days  ? ? ?Cordelia Poche, MD ?[facility] ?09/09/2021, 1:26 PM ? ? ?If 7PM-7AM, please contact night-coverage ?[email or web address] ? ?

## 2021-09-09 NOTE — Care Management Important Message (Signed)
Important Message ? ?Patient Details IM Letter placed in Patients room. ?Name: Natasha Harper ?MRN: [id number] ?Date of Birth: 1935-02-17 ? ? ?Medicare Important Message Given:  Yes ? ? ? ? ?Caren Macadam ?09/09/2021, 3:32 PM ?

## 2021-09-10 ENCOUNTER — Inpatient Hospital Stay (HOSPITAL_COMMUNITY): Payer: Medicare Other

## 2021-09-10 DIAGNOSIS — E876 Hypokalemia: Secondary | ICD-10-CM | POA: Diagnosis not present

## 2021-09-10 DIAGNOSIS — E785 Hyperlipidemia, unspecified: Secondary | ICD-10-CM | POA: Diagnosis not present

## 2021-09-10 DIAGNOSIS — M6281 Muscle weakness (generalized): Secondary | ICD-10-CM | POA: Diagnosis not present

## 2021-09-10 DIAGNOSIS — S32501A Unspecified fracture of right pubis, initial encounter for closed fracture: Secondary | ICD-10-CM | POA: Diagnosis not present

## 2021-09-10 DIAGNOSIS — M1611 Unilateral primary osteoarthritis, right hip: Secondary | ICD-10-CM | POA: Diagnosis not present

## 2021-09-10 DIAGNOSIS — I10 Essential (primary) hypertension: Secondary | ICD-10-CM | POA: Diagnosis not present

## 2021-09-10 DIAGNOSIS — Z9181 History of falling: Secondary | ICD-10-CM | POA: Diagnosis not present

## 2021-09-10 DIAGNOSIS — R7309 Other abnormal glucose: Secondary | ICD-10-CM | POA: Diagnosis not present

## 2021-09-10 DIAGNOSIS — D649 Anemia, unspecified: Secondary | ICD-10-CM | POA: Diagnosis not present

## 2021-09-10 DIAGNOSIS — E871 Hypo-osmolality and hyponatremia: Secondary | ICD-10-CM | POA: Diagnosis not present

## 2021-09-10 DIAGNOSIS — M899 Disorder of bone, unspecified: Secondary | ICD-10-CM | POA: Diagnosis not present

## 2021-09-10 DIAGNOSIS — R339 Retention of urine, unspecified: Secondary | ICD-10-CM | POA: Diagnosis not present

## 2021-09-10 DIAGNOSIS — R42 Dizziness and giddiness: Secondary | ICD-10-CM | POA: Diagnosis not present

## 2021-09-10 DIAGNOSIS — I951 Orthostatic hypotension: Secondary | ICD-10-CM | POA: Diagnosis not present

## 2021-09-10 DIAGNOSIS — R030 Elevated blood-pressure reading, without diagnosis of hypertension: Secondary | ICD-10-CM | POA: Diagnosis not present

## 2021-09-10 DIAGNOSIS — E039 Hypothyroidism, unspecified: Secondary | ICD-10-CM | POA: Diagnosis not present

## 2021-09-10 DIAGNOSIS — R945 Abnormal results of liver function studies: Secondary | ICD-10-CM | POA: Diagnosis not present

## 2021-09-10 DIAGNOSIS — S72009A Fracture of unspecified part of neck of unspecified femur, initial encounter for closed fracture: Secondary | ICD-10-CM | POA: Diagnosis not present

## 2021-09-10 DIAGNOSIS — S72002D Fracture of unspecified part of neck of left femur, subsequent encounter for closed fracture with routine healing: Secondary | ICD-10-CM | POA: Diagnosis not present

## 2021-09-10 DIAGNOSIS — M25751 Osteophyte, right hip: Secondary | ICD-10-CM | POA: Diagnosis not present

## 2021-09-10 DIAGNOSIS — K5900 Constipation, unspecified: Secondary | ICD-10-CM | POA: Diagnosis not present

## 2021-09-10 DIAGNOSIS — S72002A Fracture of unspecified part of neck of left femur, initial encounter for closed fracture: Secondary | ICD-10-CM | POA: Diagnosis not present

## 2021-09-10 LAB — CBC
HCT: 27.2 % — ABNORMAL LOW (ref 36.0–46.0)
Hemoglobin: 8.8 g/dL — ABNORMAL LOW (ref 12.0–15.0)
MCH: 33.3 pg (ref 26.0–34.0)
MCHC: 32.4 g/dL (ref 30.0–36.0)
MCV: 103 fL — ABNORMAL HIGH (ref 80.0–100.0)
Platelets: 214 10*3/uL (ref 150–400)
RBC: 2.64 MIL/uL — ABNORMAL LOW (ref 3.87–5.11)
RDW: 16 % — ABNORMAL HIGH (ref 11.5–15.5)
WBC: 4.9 10*3/uL (ref 4.0–10.5)
nRBC: 0 % (ref 0.0–0.2)

## 2021-09-10 LAB — BASIC METABOLIC PANEL
Anion gap: 7 (ref 5–15)
BUN: 13 mg/dL (ref 8–23)
CO2: 25 mmol/L (ref 22–32)
Calcium: 8.2 mg/dL — ABNORMAL LOW (ref 8.9–10.3)
Chloride: 98 mmol/L (ref 98–111)
Creatinine, Ser: <0.3 mg/dL — ABNORMAL LOW (ref 0.44–1.00)
Glucose, Bld: 103 mg/dL — ABNORMAL HIGH (ref 70–99)
Potassium: 3.7 mmol/L (ref 3.5–5.1)
Sodium: 130 mmol/L — ABNORMAL LOW (ref 135–145)

## 2021-09-10 LAB — RESP PANEL BY RT-PCR (FLU A&B, COVID) ARPGX2
Influenza A by PCR: NEGATIVE
Influenza B by PCR: NEGATIVE
SARS Coronavirus 2 by RT PCR: NEGATIVE

## 2021-09-10 MED ORDER — ASPIRIN 325 MG PO TBEC: 325.0000 mg | DELAYED_RELEASE_TABLET | Freq: Every day | ORAL | 0 refills | Status: DC

## 2021-09-10 MED ORDER — ENSURE ENLIVE PO LIQD: 237.0000 mL | Freq: Two times a day (BID) | ORAL | Status: DC

## 2021-09-10 MED ORDER — SODIUM CHLORIDE 1 G PO TABS: 1.0000 g | ORAL_TABLET | Freq: Three times a day (TID) | ORAL | 0 refills | Status: AC

## 2021-09-10 MED ORDER — HYDROCODONE-ACETAMINOPHEN 5-325 MG PO TABS: 1.0000 | ORAL_TABLET | ORAL | 0 refills | Status: DC | PRN

## 2021-09-10 NOTE — Progress Notes (Signed)
OT Cancellation Note ? ?Patient Details ?Name: Natasha Harper ?MRN: [id number] ?DOB: 11/28/1934 ? ? ?Cancelled Treatment:    Reason Eval/Treat Not Completed: Pain limiting ability to participate;Other (comment) First attempt patient asking for pain medicine. Second attempt patient just had XR on R hip 2* c/o pain. RN states if the XR is negative patient will be D/Cing to rehab today. Will re-attempt 3/8 if patient still here. ? ?Marlyce Huge OT ?OT pager: [phone] ? ? ?Carmelia Roller ?09/10/2021, 11:44 AM ?

## 2021-09-10 NOTE — Progress Notes (Signed)
Patient's IV was removed. PTAR came to transfer patient to [facility]. Discharge given to transport to be taken to facility. ?

## 2021-09-10 NOTE — Consult Note (Signed)
[facility] CM Inpatient Consult ? ? ?09/10/2021 ? ?Natasha Harper ?[birth year] ?[id number] ? ?[facility] CM) ?  ?Patient chart reviewed with noted high risk score for unplanned readmission. Assessed for post hospital chronic disease management and care coordination needs. Patient's provider office offers embedded chronic case management and disease management services and team. ?  ?Plan: Per review, current recommendation is for SNF level of care at discharge. No [facility] CM needs as patient needs met at SNF level of care. ? ?Of note, [facility] Care Management services does not replace or interfere with any services that are arranged by inpatient case management or social work.  ? ?Netta Cedars, MSN, RN ?[facility] Liaison ?Mobile Phone [phone]  ?Toll free office [phone]  ? ?

## 2021-09-10 NOTE — TOC Transition Note (Signed)
Transition of Care (TOC) - CM/SW Discharge Note ? ? ?Patient Details  ?Name: Natasha Harper ?MRN: [id number] ?Date of Birth: 25-Feb-1935 ? ?Transition of Care (TOC) CM/SW Contact:  ?Conchita Truxillo, LCSW ?Phone Number: ?09/10/2021, 2:49 PM ? ? ?Clinical Narrative:    ?Pt medically cleared for dc today to [facility] and have received insurance authorization (# [id number]).  Pt and spouse aware and PTAR called at 2:50 pm.  RN to call report to [phone]. No further TOC needs. ? ? ?Final next level of care: Skilled Nursing Facility ?Barriers to Discharge: Barriers Resolved ? ? ?Patient Goals and CMS Choice ?Patient states their goals for this hospitalization and ongoing recovery are:: eventually return home following rehab ?  ?  ? ?Discharge Placement ?  ?           ?Patient chooses bed at: [facility] ?Patient to be transferred to facility by: PTAR ?Name of family member notified: spouse ?Patient and family notified of of transfer: 09/10/21 ? ?Discharge Plan and Services ?  ?  ?           ?DME Arranged: N/A ?DME Agency: NA ?  ?  ?  ?  ?  ?  ?  ?  ? ?Social Determinants of Health (SDOH) Interventions ?  ? ? ?Readmission Risk Interventions ?Readmission Risk Prevention Plan 07/17/2021  ?Transportation Screening Complete  ?PCP or Specialist Appt within 3-5 Days Not Complete  ?Not Complete comments Plan to d/c to [facility] Inpatient rehab  ?HRI or Home Care Consult Not Complete  ?HRI or Home Care Consult comments Patient to d/c to [facility] Inpatient rehab  ?Social Work Consult for Recovery Care Planning/Counseling Complete  ?Palliative Care Screening Not Applicable  ?Medication Review Oceanographer) Referral to Pharmacy  ?Some recent data might be hidden  ? ? ? ? ? ?

## 2021-09-10 NOTE — Progress Notes (Signed)
Report called to [place] at [place].  ?

## 2021-09-10 NOTE — Discharge Summary (Signed)
Physician Discharge Summary   Patient: Natasha Harper MRN: [id number] DOB: 1934/09/13  Admit date:     09/04/2021  Discharge date: 09/10/21  Discharge Physician: Cordelia Poche, MD   PCP: Burnis Medin, MD   Recommendations at discharge:   Outpatient urology follow-up for voiding trial Orthopedic surgery follow-up in 2 weeks Repeat BMP and CBC in 3-5 days Aspirin 325 mg daily; please resume home aspirin 81 mg daily after completion of VTE prophylaxis (30 days)  Discharge Diagnoses: Principal Problem:   Hip fracture ([place]) Active Problems:   Dyslipidemia   Hypothyroidism   Hyponatremia   Anemia   Primary hypertension   Fall at home, initial encounter   Acute urinary retention  Resolved Problems:   * No resolved hospital problems. *  Hospital Course: Natasha Harper is a 87 y.o. female with a history of hypertension, hyperlipidemia, CAD, hypothyroidism. Patient presented secondary to a fall and found to have a left hip fracture. Orthopedic surgery consulted and performed ORIF on 3/2. Discharge to SNF.  Assessment and Plan: * Hip fracture ([place]) Secondary to fall. Orthopedic surgery consulted and performed ORIF on 3/2. Weight bearing as tolerated. PT recommending SNF. Orthopedic surgery recommendations are WBAT and aspirin for DVT prophylaxis  Acute urinary retention Foley inserted perioperatively. Upon attempt to remove, patient developed significant urinary retention. Possibly complicated by narcotics. Foley re-inserted on 3/5. Urine culture with no growth. Decision to discharge with a foley catheter and recommend outpatient urology follow-up for voiding trial.  Fall at home, initial encounter Trip and fall. No loss of consciousness.  Primary hypertension Patient is prescribed amlodipine (listed as not taking) and losartan (listed as not taking) as an outpatient which were held on admission. Uncontrolled blood pressure. Continue home regimen on discharge.  Anemia Acute  blood loss anemia on chronic anemia. Acute from fracture and surgery. Hemoglobin down to 6.9 on CBC on 3/5. 1 unit of PRBC ordered and transfused. Post-transfusion hemoglobin of 9.3 > 9.2 > 8.8. No obvious evidence of acute hemorrhage noted. Repeat CBC in 3-5 days  Hyponatremia Mild. Asymptomatic. Initial concern for hypovolemic hyponatremia. Started on IV fluids with no significant improvement. IV fluids held. Normal TSH. Cortisol is normal. Patient started on sodium tablets with some improvement. Sodium stable. Discharge with 2 days of sodium tablets and recommend repeat BMP in 3-5 days  Hypothyroidism Continue Synthroid.  Dyslipidemia Continue Lipitor         Consultants: Orthopedic surgery Procedures performed: LEFT IM NAIL  Disposition: Skilled nursing facility Diet recommendation:  Cardiac diet  DISCHARGE MEDICATION: Allergies as of 09/10/2021       Reactions   Actonel [risedronate Sodium]    bloating   Evista [raloxifene]    Abdominal cramps   Fosamax [alendronate Sodium]    Abdominal cramps        Medication List     STOP taking these medications    aspirin 81 MG tablet Replaced by: aspirin 325 MG EC tablet   baclofen 10 MG tablet Commonly known as: LIORESAL   traMADol 50 MG tablet Commonly known as: ULTRAM       TAKE these medications    acetaminophen 500 MG tablet Commonly known as: TYLENOL Take 1,000 mg by mouth in the morning and at bedtime.   amLODipine 5 MG tablet Commonly known as: NORVASC Take 1 tablet (5 mg total) by mouth daily.   aspirin 325 MG EC tablet Take 1 tablet (325 mg total) by mouth daily with breakfast. Start taking on:  September 11, 2021 Replaces: aspirin 81 MG tablet   atorvastatin 80 MG tablet Commonly known as: LIPITOR TAKE 1 TABLET BY MOUTH DAILY   feeding supplement Liqd Take 237 mLs by mouth 2 (two) times daily between meals.   fish oil-omega-3 fatty acids 1000 MG capsule Take 1,000 mg by mouth daily.   folic  acid 1 MG tablet Commonly known as: FOLVITE Take 1 tablet (1 mg total) by mouth daily.   HYDROcodone-acetaminophen 5-325 MG tablet Commonly known as: NORCO/VICODIN Take 1-2 tablets by mouth every 4 (four) hours as needed for moderate pain (pain score 4-6).   lidocaine 5 % Commonly known as: LIDODERM Place 1 patch onto the skin daily. Remove & Discard patch within 12 hours or as directed by MD What changed:  when to take this reasons to take this   losartan 50 MG tablet Commonly known as: COZAAR Take 1 tablet (50 mg total) by mouth daily.   MAGNESIUM PO Take 1 tablet by mouth daily at 12 noon.   MULTIPLE VITAMIN PO Take 1 tablet by mouth daily.   polyethylene glycol 17 g packet Commonly known as: MIRALAX / GLYCOLAX Take 17 g by mouth daily as needed for mild constipation. Can take up to twice a day for constipation.   sodium chloride 1 g tablet Take 1 tablet (1 g total) by mouth 3 (three) times daily with meals for 2 days.   Synthroid 50 MCG tablet Generic drug: levothyroxine TAKE 1 TABLET BY MOUTH DAILY What changed:  how much to take when to take this   VITAMIN K2 PO Take 1 tablet by mouth daily. Vitamin D3        Follow-up Information     Mcarthur Rossetti, MD. Schedule an appointment as soon as possible for a visit in 2 week(s).   Specialty: Orthopedic Surgery Contact information: [address] [place] [address] [phone]                Discharge Exam: Danley Danker Weights   09/05/21 1645 09/06/21 0438  Weight: 47.2 kg 47 kg   General exam: Appears calm and comfortable Respiratory system: Clear to auscultation. Respiratory effort normal. Cardiovascular system: S1 & S2 heard, RRR. No murmurs, rubs, gallops or clicks. Gastrointestinal system: Abdomen is nondistended, soft and nontender. No organomegaly or masses felt. Normal bowel sounds heard. Central nervous system: Alert and oriented. No focal neurological deficits. Musculoskeletal:  No edema. No calf tenderness. Right lateral hip tenderness Skin: No cyanosis. No rashes Psychiatry: Judgement and insight appear normal. Mood & affect appropriate.   Condition at discharge: stable  The results of significant diagnostics from this hospitalization (including imaging, microbiology, ancillary and laboratory) are listed below for reference.   Imaging Studies: CT Thoracic Spine Wo Contrast  Result Date: 09/05/2021 CLINICAL DATA:  Fall EXAM: CT THORACIC AND LUMBAR SPINE WITHOUT CONTRAST TECHNIQUE: Multidetector CT imaging of the thoracic and lumbar spine was performed without contrast. Multiplanar CT image reconstructions were also generated. RADIATION DOSE REDUCTION: This exam was performed according to the departmental dose-optimization program which includes automated exposure control, adjustment of the mA and/or kV according to patient size and/or use of iterative reconstruction technique. COMPARISON:  07/05/2021 FINDINGS: CT THORACIC SPINE FINDINGS Alignment: Normal. Vertebrae: Mild compression deformity of T1 is unchanged. Now status post T6 vertebral augmentation without progressive height loss. Chronic compression deformities at T8-T11 are unchanged. No acute compression fracture. Paraspinal and other soft tissues: There is calcific aortic atherosclerosis. Cardiomegaly with bilateral dependent atelectasis. Disc levels:  No spinal canal or neural foraminal stenosis. CT LUMBAR SPINE FINDINGS Segmentation: 5 lumbar type vertebrae. Alignment: Grade 1 anterolisthesis at L4-5. Vertebrae: Chronic compression deformity of L1.  No acute fracture. Paraspinal and other soft tissues: Calcific aortic atherosclerosis. Disc levels: No spinal canal stenosis or nerve root impingement. IMPRESSION: 1. No acute fracture of the thoracic or lumbar spine. 2. Status post T6 vertebral augmentation without progressive height loss. 3. Chronic compression deformities at T1, T8-T11 and L1. Aortic Atherosclerosis  (ICD10-I70.0). Electronically Signed   By: Ulyses Jarred M.D.   On: 09/05/2021 02:19   CT Lumbar Spine Wo Contrast  Result Date: 09/05/2021 CLINICAL DATA:  Fall EXAM: CT THORACIC AND LUMBAR SPINE WITHOUT CONTRAST TECHNIQUE: Multidetector CT imaging of the thoracic and lumbar spine was performed without contrast. Multiplanar CT image reconstructions were also generated. RADIATION DOSE REDUCTION: This exam was performed according to the departmental dose-optimization program which includes automated exposure control, adjustment of the mA and/or kV according to patient size and/or use of iterative reconstruction technique. COMPARISON:  07/05/2021 FINDINGS: CT THORACIC SPINE FINDINGS Alignment: Normal. Vertebrae: Mild compression deformity of T1 is unchanged. Now status post T6 vertebral augmentation without progressive height loss. Chronic compression deformities at T8-T11 are unchanged. No acute compression fracture. Paraspinal and other soft tissues: There is calcific aortic atherosclerosis. Cardiomegaly with bilateral dependent atelectasis. Disc levels: No spinal canal or neural foraminal stenosis. CT LUMBAR SPINE FINDINGS Segmentation: 5 lumbar type vertebrae. Alignment: Grade 1 anterolisthesis at L4-5. Vertebrae: Chronic compression deformity of L1.  No acute fracture. Paraspinal and other soft tissues: Calcific aortic atherosclerosis. Disc levels: No spinal canal stenosis or nerve root impingement. IMPRESSION: 1. No acute fracture of the thoracic or lumbar spine. 2. Status post T6 vertebral augmentation without progressive height loss. 3. Chronic compression deformities at T1, T8-T11 and L1. Aortic Atherosclerosis (ICD10-I70.0). Electronically Signed   By: Ulyses Jarred M.D.   On: 09/05/2021 02:19   DG C-Arm 1-60 Min-No Report  Result Date: 09/05/2021 Fluoroscopy was utilized by the requesting physician.  No radiographic interpretation.   DG Hip Unilat With Pelvis 2-3 Views Left  Result Date:  09/04/2021 CLINICAL DATA:  Fall.  Left hip pain. EXAM: DG HIP (WITH OR WITHOUT PELVIS) 2-3V LEFT COMPARISON:  None. FINDINGS: Comminuted and angulated intertrochanteric fracture of the left femur. No dislocation. The bones are osteopenic. Age indeterminate fracture of the left inferior pubic ramus. The soft tissues are unremarkable. Vascular calcification noted. IMPRESSION: 1. Comminuted and angulated intertrochanteric fracture of the left femur. 2. Age indeterminate fracture of the left inferior pubic ramus. Electronically Signed   By: Anner Crete M.D.   On: 09/04/2021 23:38   DG HIP UNILAT WITH PELVIS 2-3 VIEWS RIGHT  Result Date: 09/10/2021 CLINICAL DATA:  87 year old female with history of right-sided hip pain since surgery on the left hip on 09/05/2021. EXAM: DG HIP (WITH OR WITHOUT PELVIS) 2-3V RIGHT COMPARISON:  09/04/2021. FINDINGS: Three views of the bony pelvis and the right hip demonstrate gamma nail fixation of the previously noted intertrochanteric left hip fracture with restoration of near anatomic alignment. Skin staples are noted lateral to the left hip joint. Old nondisplaced fracture of the left inferior pubic ramus is again noted. Right proximal femur as visualized appears intact. Right femoral head is located. Joint space narrowing, subchondral sclerosis, subchondral cyst formation and osteophyte formation in the right hip joint, compatible with osteoarthritis. IMPRESSION: 1. No acute radiographic abnormality of the right hip. 2. Moderate right hip joint osteoarthritis. 3. New postoperative  changes of ORIF in the left proximal femur and old nondisplaced fracture of the left inferior pubic ramus, as above. Electronically Signed   By: Vinnie Langton M.D.   On: 09/10/2021 11:16   DG FEMUR MIN 2 VIEWS LEFT  Result Date: 09/05/2021 CLINICAL DATA:  Fluoroscopic assistance for internal fixation of intertrochanteric fracture of left femur EXAM: LEFT FEMUR 2 VIEWS COMPARISON:  09/04/2021  FINDINGS: There is interval reduction and internal fixation of intertrochanteric fracture of left femur with intramedullary rod. Fluoroscopic time was 53 seconds. Radiation dose is 7.06 mGy. IMPRESSION: Fluoroscopic assistance was provided for internal fixation of intertrochanteric fracture of left femur. Electronically Signed   By: Elmer Picker M.D.   On: 09/05/2021 18:25    Microbiology: Results for orders placed or performed during the hospital encounter of 09/04/21  Resp Panel by RT-PCR (Flu A&B, Covid) Nasopharyngeal Swab     Status: None   Collection Time: 09/05/21  1:50 AM   Specimen: Nasopharyngeal Swab; Nasopharyngeal(NP) swabs in vial transport medium  Result Value Ref Range Status   SARS Coronavirus 2 by RT PCR NEGATIVE NEGATIVE Final    Comment: (NOTE) SARS-CoV-2 target nucleic acids are NOT DETECTED.  The SARS-CoV-2 RNA is generally detectable in upper respiratory specimens during the acute phase of infection. The lowest concentration of SARS-CoV-2 viral copies this assay can detect is 138 copies/mL. A negative result does not preclude SARS-Cov-2 infection and should not be used as the sole basis for treatment or other patient management decisions. A negative result may occur with  improper specimen collection/handling, submission of specimen other than nasopharyngeal swab, presence of viral mutation(s) within the areas targeted by this assay, and inadequate number of viral copies(<138 copies/mL). A negative result must be combined with clinical observations, patient history, and epidemiological information. The expected result is Negative.  Fact Sheet for Patients:  EntrepreneurPulse.com.au  Fact Sheet for Healthcare Providers:  IncredibleEmployment.be  This test is no t yet approved or cleared by the [place] FDA and  has been authorized for detection and/or diagnosis of SARS-CoV-2 by FDA under an Emergency Use  Authorization (EUA). This EUA will remain  in effect (meaning this test can be used) for the duration of the COVID-19 declaration under Section 564(b)(1) of the Act, 21 U.S.C.section 360bbb-3(b)(1), unless the authorization is terminated  or revoked sooner.       Influenza A by PCR NEGATIVE NEGATIVE Final   Influenza B by PCR NEGATIVE NEGATIVE Final    Comment: (NOTE) The Xpert Xpress SARS-CoV-2/FLU/RSV plus assay is intended as an aid in the diagnosis of influenza from Nasopharyngeal swab specimens and should not be used as a sole basis for treatment. Nasal washings and aspirates are unacceptable for Xpert Xpress SARS-CoV-2/FLU/RSV testing.  Fact Sheet for Patients: EntrepreneurPulse.com.au  Fact Sheet for Healthcare Providers: IncredibleEmployment.be  This test is not yet approved or cleared by the [place] FDA and has been authorized for detection and/or diagnosis of SARS-CoV-2 by FDA under an Emergency Use Authorization (EUA). This EUA will remain in effect (meaning this test can be used) for the duration of the COVID-19 declaration under Section 564(b)(1) of the Act, 21 U.S.C. section 360bbb-3(b)(1), unless the authorization is terminated or revoked.  Performed at [facility], [place] [address], [place], [place] [address]   Surgical pcr screen     Status: None   Collection Time: 09/05/21 12:54 PM   Specimen: Nasal Mucosa; Nasal Swab  Result Value Ref Range Status   MRSA, PCR  NEGATIVE NEGATIVE Final   Staphylococcus aureus NEGATIVE NEGATIVE Final    Comment: (NOTE) The Xpert SA Assay (FDA approved for NASAL specimens in patients 67 years of age and older), is one component of a comprehensive surveillance program. It is not intended to diagnose infection nor to guide or monitor treatment. Performed at [facility], [place] [address], [place], [place] [address]   Urine Culture     Status: None    Collection Time: 09/08/21 12:28 PM   Specimen: Urine, Catheterized  Result Value Ref Range Status   Specimen Description   Final    URINE, CATHETERIZED Performed at [place] [address], [place], [place] [address]    Special Requests   Final    NONE Performed at [facility], [place] [address], [place], [place] [address]    Culture   Final    NO GROWTH Performed at [facility], [place] [address], [place] [address]    Report Status 09/09/2021 FINAL  Final    Labs: CBC: Recent Labs  Lab 09/05/21 0150 09/06/21 1349 09/07/21 0406 09/08/21 0550 09/08/21 1519 09/09/21 0559 09/10/21 0422  WBC 7.5 6.4 6.2 4.3  --  4.5 4.9  NEUTROABS 6.3  --   --   --   --   --   --   HGB 10.0* 8.1* 8.1* 6.9* 9.3* 9.2* 8.8*  HCT 30.3* 24.3* 24.6* 20.6* 27.5* 26.9* 27.2*  MCV 103.8* 104.3* 104.2* 104.6*  --  99.3 103.0*  PLT 318 191 197 170  --  206 [id number]   Basic Metabolic Panel: Recent Labs  Lab 09/07/21 0406 09/08/21 0550 09/08/21 1519 09/09/21 0559 09/09/21 1616 09/10/21 0422  NA 128* 127* 132* 130* 131* 130*  K 3.9 3.6 3.5 3.5  --  3.7  CL 97* 98 100 100  --  98  CO2 25 24 25 25   --  25  GLUCOSE 97 99 125* 93  --  103*  BUN 12 11 12 11   --  13  CREATININE 0.35* 0.35* <0.30* <0.30*  --  <0.30*  CALCIUM 8.3* 8.0* 8.0* 8.2*  --  8.2*    Discharge time spent:  35 minutes.  Signed: Cordelia Poche, MD [facility] 09/10/2021

## 2021-09-16 DIAGNOSIS — K5900 Constipation, unspecified: Secondary | ICD-10-CM | POA: Diagnosis not present

## 2021-09-16 DIAGNOSIS — E876 Hypokalemia: Secondary | ICD-10-CM | POA: Diagnosis not present

## 2021-09-16 DIAGNOSIS — D649 Anemia, unspecified: Secondary | ICD-10-CM | POA: Diagnosis not present

## 2021-09-20 DIAGNOSIS — I951 Orthostatic hypotension: Secondary | ICD-10-CM | POA: Diagnosis not present

## 2021-09-20 DIAGNOSIS — E871 Hypo-osmolality and hyponatremia: Secondary | ICD-10-CM | POA: Diagnosis not present

## 2021-09-20 DIAGNOSIS — D649 Anemia, unspecified: Secondary | ICD-10-CM | POA: Diagnosis not present

## 2021-09-20 DIAGNOSIS — R42 Dizziness and giddiness: Secondary | ICD-10-CM | POA: Diagnosis not present

## 2021-09-24 DIAGNOSIS — R339 Retention of urine, unspecified: Secondary | ICD-10-CM | POA: Diagnosis not present

## 2021-09-24 DIAGNOSIS — S72002D Fracture of unspecified part of neck of left femur, subsequent encounter for closed fracture with routine healing: Secondary | ICD-10-CM | POA: Diagnosis not present

## 2021-09-24 DIAGNOSIS — R945 Abnormal results of liver function studies: Secondary | ICD-10-CM | POA: Diagnosis not present

## 2021-09-27 DIAGNOSIS — R339 Retention of urine, unspecified: Secondary | ICD-10-CM | POA: Diagnosis not present

## 2021-09-27 DIAGNOSIS — Z9181 History of falling: Secondary | ICD-10-CM | POA: Diagnosis not present

## 2021-09-27 DIAGNOSIS — S72002D Fracture of unspecified part of neck of left femur, subsequent encounter for closed fracture with routine healing: Secondary | ICD-10-CM | POA: Diagnosis not present

## 2021-09-27 DIAGNOSIS — M6281 Muscle weakness (generalized): Secondary | ICD-10-CM | POA: Diagnosis not present

## 2021-09-30 ENCOUNTER — Telehealth: Payer: Self-pay | Admitting: Internal Medicine

## 2021-09-30 NOTE — Telephone Encounter (Signed)
Left message for patient to call back and schedule Medicare Annual Wellness Visit (AWV) either virtually or in office. Left  my jabber number [phone]   Last AWV 12/20/14 ; please schedule at anytime with [facility] Nurse Health Advisor 1 or 2    

## 2021-10-01 ENCOUNTER — Encounter: Payer: Medicare Other | Admitting: Orthopaedic Surgery

## 2021-10-02 ENCOUNTER — Telehealth: Payer: Self-pay

## 2021-10-02 DIAGNOSIS — I252 Old myocardial infarction: Secondary | ICD-10-CM | POA: Diagnosis not present

## 2021-10-02 DIAGNOSIS — R338 Other retention of urine: Secondary | ICD-10-CM | POA: Diagnosis not present

## 2021-10-02 DIAGNOSIS — E870 Hyperosmolality and hypernatremia: Secondary | ICD-10-CM | POA: Diagnosis not present

## 2021-10-02 DIAGNOSIS — E876 Hypokalemia: Secondary | ICD-10-CM | POA: Diagnosis not present

## 2021-10-02 DIAGNOSIS — E039 Hypothyroidism, unspecified: Secondary | ICD-10-CM | POA: Diagnosis not present

## 2021-10-02 DIAGNOSIS — I2510 Atherosclerotic heart disease of native coronary artery without angina pectoris: Secondary | ICD-10-CM | POA: Diagnosis not present

## 2021-10-02 DIAGNOSIS — I6523 Occlusion and stenosis of bilateral carotid arteries: Secondary | ICD-10-CM | POA: Diagnosis not present

## 2021-10-02 DIAGNOSIS — M80052D Age-related osteoporosis with current pathological fracture, left femur, subsequent encounter for fracture with routine healing: Secondary | ICD-10-CM | POA: Diagnosis not present

## 2021-10-02 DIAGNOSIS — M8008XD Age-related osteoporosis with current pathological fracture, vertebra(e), subsequent encounter for fracture with routine healing: Secondary | ICD-10-CM | POA: Diagnosis not present

## 2021-10-02 DIAGNOSIS — I10 Essential (primary) hypertension: Secondary | ICD-10-CM | POA: Diagnosis not present

## 2021-10-02 DIAGNOSIS — I951 Orthostatic hypotension: Secondary | ICD-10-CM | POA: Diagnosis not present

## 2021-10-02 DIAGNOSIS — E785 Hyperlipidemia, unspecified: Secondary | ICD-10-CM | POA: Diagnosis not present

## 2021-10-02 DIAGNOSIS — D62 Acute posthemorrhagic anemia: Secondary | ICD-10-CM | POA: Diagnosis not present

## 2021-10-02 DIAGNOSIS — D539 Nutritional anemia, unspecified: Secondary | ICD-10-CM | POA: Diagnosis not present

## 2021-10-02 DIAGNOSIS — K5900 Constipation, unspecified: Secondary | ICD-10-CM | POA: Diagnosis not present

## 2021-10-02 DIAGNOSIS — W19XXXD Unspecified fall, subsequent encounter: Secondary | ICD-10-CM | POA: Diagnosis not present

## 2021-10-02 NOTE — Telephone Encounter (Signed)
Last Ov 03/05/21 ?Please advise  ?

## 2021-10-02 NOTE — Telephone Encounter (Signed)
Mary RN from [facility] called wanting a plan of care for the patient for one time a week for 4 weeks. She also wanted to report that the patient has been having issues with taking amlodipine and losartan.  The nurse can be reached at [phone] ?

## 2021-10-02 NOTE — Telephone Encounter (Signed)
Give home helath this message ........:Im ok with nurse visit   but I haven't seen patient since 8 22 and she will need a visit to  conform to Home health guidelines .  Ortho care orders should be from ortho care.

## 2021-10-03 ENCOUNTER — Encounter: Payer: Self-pay | Admitting: Orthopaedic Surgery

## 2021-10-03 ENCOUNTER — Ambulatory Visit (INDEPENDENT_AMBULATORY_CARE_PROVIDER_SITE_OTHER): Payer: Medicare Other | Admitting: Orthopaedic Surgery

## 2021-10-03 ENCOUNTER — Ambulatory Visit (INDEPENDENT_AMBULATORY_CARE_PROVIDER_SITE_OTHER): Payer: Medicare Other

## 2021-10-03 DIAGNOSIS — S72142D Displaced intertrochanteric fracture of left femur, subsequent encounter for closed fracture with routine healing: Secondary | ICD-10-CM

## 2021-10-03 DIAGNOSIS — Z9889 Other specified postprocedural states: Secondary | ICD-10-CM | POA: Diagnosis not present

## 2021-10-03 DIAGNOSIS — Z8781 Personal history of (healed) traumatic fracture: Secondary | ICD-10-CM | POA: Diagnosis not present

## 2021-10-03 NOTE — Telephone Encounter (Signed)
Spoke with Mechele Claude from [facility] and informed of message.  Stanton Kidney stated someone will be contacting the office to schedule Ov with PCP and Ortho care has been contacted. ?

## 2021-10-03 NOTE — Progress Notes (Signed)
The patient is an 87 year old who is now over 3 weeks status post open reduction/internal fixation of a complex left hip intertrochanteric fracture.  She is with her husband today.  She reports some hip pain.  She has been ambulating with a walker and doing pretty well she states. ? ?The 2 incisions of healed nicely.  The staples are still in place and they can be removed today.  She has good range of motion of her left operative hip and has minimal discomfort and pain. ? ?An AP pelvis and lateral of the left hip shows the fracture is in good alignment and compressed appropriately.  There is no complicating features of the hardware. ? ?She will continue to slowly increase her activities as comfort allows.  I would like to see her back in 4 weeks with an AP and lateral of the left hip only.  We do not need to see the pelvis shows an AP and lateral left hip.  All questions and concerns were answered and addressed. ?

## 2021-10-07 ENCOUNTER — Telehealth: Payer: Self-pay | Admitting: Internal Medicine

## 2021-10-07 NOTE — Telephone Encounter (Signed)
Natasha Harper with [facility] is calling and needs Harper orders for 1x1, 2x3, 1x5  beginning 10-04-2021 ?

## 2021-10-08 NOTE — Telephone Encounter (Signed)
Verbal orders given to Cecilia. 

## 2021-10-23 ENCOUNTER — Telehealth: Payer: Self-pay | Admitting: Internal Medicine

## 2021-10-23 NOTE — Telephone Encounter (Signed)
Left message for patient to call back and schedule Medicare Annual Wellness Visit (AWV) either virtually or in office. Left  my jabber number [phone]   Last AWV 12/20/14 ; please schedule at anytime with [facility] Nurse Health Advisor 1 or 2    

## 2021-10-31 ENCOUNTER — Ambulatory Visit (INDEPENDENT_AMBULATORY_CARE_PROVIDER_SITE_OTHER): Payer: Medicare Other | Admitting: Orthopaedic Surgery

## 2021-10-31 ENCOUNTER — Ambulatory Visit (INDEPENDENT_AMBULATORY_CARE_PROVIDER_SITE_OTHER): Payer: Medicare Other

## 2021-10-31 ENCOUNTER — Encounter: Payer: Self-pay | Admitting: Orthopaedic Surgery

## 2021-10-31 DIAGNOSIS — Z9889 Other specified postprocedural states: Secondary | ICD-10-CM

## 2021-10-31 DIAGNOSIS — Z8781 Personal history of (healed) traumatic fracture: Secondary | ICD-10-CM

## 2021-10-31 DIAGNOSIS — S72142D Displaced intertrochanteric fracture of left femur, subsequent encounter for closed fracture with routine healing: Secondary | ICD-10-CM

## 2021-10-31 NOTE — Progress Notes (Signed)
The patient is an 87 year old female who is now 8 weeks status post open reduction/central fixation of a complex left hip intertrochanteric fracture.  Her husband is with her today.  She seems to be doing well and is improving on a weekly basis.  She still has home therapy coming to her house.  She is a thin individual and has also had chronic lumbar spine issues. ? ?She tolerates me easily putting her left operative hip through range of motion. ? ?2 views of the left hip show the fracture is healed completely and there is no evidence of hardware failure. ? ?This point follow-up can be as needed since she is doing so well.  All questions concerns were answered and addressed.  She can continue therapy from my standpoint to work with her balance and coordination and confidence overall. ?

## 2021-11-01 DIAGNOSIS — E876 Hypokalemia: Secondary | ICD-10-CM | POA: Diagnosis not present

## 2021-11-01 DIAGNOSIS — R338 Other retention of urine: Secondary | ICD-10-CM | POA: Diagnosis not present

## 2021-11-01 DIAGNOSIS — M80052D Age-related osteoporosis with current pathological fracture, left femur, subsequent encounter for fracture with routine healing: Secondary | ICD-10-CM | POA: Diagnosis not present

## 2021-11-01 DIAGNOSIS — D539 Nutritional anemia, unspecified: Secondary | ICD-10-CM | POA: Diagnosis not present

## 2021-11-01 DIAGNOSIS — E039 Hypothyroidism, unspecified: Secondary | ICD-10-CM | POA: Diagnosis not present

## 2021-11-01 DIAGNOSIS — I6523 Occlusion and stenosis of bilateral carotid arteries: Secondary | ICD-10-CM | POA: Diagnosis not present

## 2021-11-01 DIAGNOSIS — K5900 Constipation, unspecified: Secondary | ICD-10-CM | POA: Diagnosis not present

## 2021-11-01 DIAGNOSIS — I252 Old myocardial infarction: Secondary | ICD-10-CM | POA: Diagnosis not present

## 2021-11-01 DIAGNOSIS — D62 Acute posthemorrhagic anemia: Secondary | ICD-10-CM | POA: Diagnosis not present

## 2021-11-01 DIAGNOSIS — M8008XD Age-related osteoporosis with current pathological fracture, vertebra(e), subsequent encounter for fracture with routine healing: Secondary | ICD-10-CM | POA: Diagnosis not present

## 2021-11-01 DIAGNOSIS — W19XXXD Unspecified fall, subsequent encounter: Secondary | ICD-10-CM | POA: Diagnosis not present

## 2021-11-01 DIAGNOSIS — I10 Essential (primary) hypertension: Secondary | ICD-10-CM | POA: Diagnosis not present

## 2021-11-01 DIAGNOSIS — I951 Orthostatic hypotension: Secondary | ICD-10-CM | POA: Diagnosis not present

## 2021-11-01 DIAGNOSIS — E785 Hyperlipidemia, unspecified: Secondary | ICD-10-CM | POA: Diagnosis not present

## 2021-11-01 DIAGNOSIS — I2510 Atherosclerotic heart disease of native coronary artery without angina pectoris: Secondary | ICD-10-CM | POA: Diagnosis not present

## 2021-11-01 DIAGNOSIS — E870 Hyperosmolality and hypernatremia: Secondary | ICD-10-CM | POA: Diagnosis not present

## 2021-11-20 ENCOUNTER — Telehealth: Payer: Self-pay | Admitting: Internal Medicine

## 2021-11-20 NOTE — Telephone Encounter (Signed)
Left message for patient to call back and schedule Medicare Annual Wellness Visit (AWV) either virtually or in office. Left  my jabber number [phone]   Last AWV 12/20/14 ; please schedule at anytime with [facility] Nurse Health Advisor 1 or 2    

## 2021-11-25 ENCOUNTER — Other Ambulatory Visit: Payer: Self-pay | Admitting: Internal Medicine

## 2021-11-27 ENCOUNTER — Telehealth: Payer: Self-pay | Admitting: Internal Medicine

## 2021-11-27 NOTE — Telephone Encounter (Signed)
Last Ov 03/05/21 ?Please advise  ?

## 2021-11-27 NOTE — Telephone Encounter (Signed)
So I have not seen her in the last 9 months.  Okay to give a verbal order but she will need an in person face-to-face visit in the near future by Medicare rules.

## 2021-11-27 NOTE — Telephone Encounter (Signed)
Requesting an extension of home health 1x9,. Can accept verbal order over the phone

## 2021-11-28 NOTE — Telephone Encounter (Signed)
Spoke with husband left message with him to have patient call back to schedule an appt

## 2021-12-01 DIAGNOSIS — D539 Nutritional anemia, unspecified: Secondary | ICD-10-CM | POA: Diagnosis not present

## 2021-12-01 DIAGNOSIS — D62 Acute posthemorrhagic anemia: Secondary | ICD-10-CM | POA: Diagnosis not present

## 2021-12-01 DIAGNOSIS — K5900 Constipation, unspecified: Secondary | ICD-10-CM | POA: Diagnosis not present

## 2021-12-01 DIAGNOSIS — I6523 Occlusion and stenosis of bilateral carotid arteries: Secondary | ICD-10-CM | POA: Diagnosis not present

## 2021-12-01 DIAGNOSIS — I2510 Atherosclerotic heart disease of native coronary artery without angina pectoris: Secondary | ICD-10-CM | POA: Diagnosis not present

## 2021-12-01 DIAGNOSIS — E039 Hypothyroidism, unspecified: Secondary | ICD-10-CM | POA: Diagnosis not present

## 2021-12-01 DIAGNOSIS — M8008XD Age-related osteoporosis with current pathological fracture, vertebra(e), subsequent encounter for fracture with routine healing: Secondary | ICD-10-CM | POA: Diagnosis not present

## 2021-12-01 DIAGNOSIS — I951 Orthostatic hypotension: Secondary | ICD-10-CM | POA: Diagnosis not present

## 2021-12-01 DIAGNOSIS — E785 Hyperlipidemia, unspecified: Secondary | ICD-10-CM | POA: Diagnosis not present

## 2021-12-01 DIAGNOSIS — M80052D Age-related osteoporosis with current pathological fracture, left femur, subsequent encounter for fracture with routine healing: Secondary | ICD-10-CM | POA: Diagnosis not present

## 2021-12-01 DIAGNOSIS — E876 Hypokalemia: Secondary | ICD-10-CM | POA: Diagnosis not present

## 2021-12-01 DIAGNOSIS — I252 Old myocardial infarction: Secondary | ICD-10-CM | POA: Diagnosis not present

## 2021-12-01 DIAGNOSIS — E870 Hyperosmolality and hypernatremia: Secondary | ICD-10-CM | POA: Diagnosis not present

## 2021-12-01 DIAGNOSIS — H9190 Unspecified hearing loss, unspecified ear: Secondary | ICD-10-CM | POA: Diagnosis not present

## 2021-12-01 DIAGNOSIS — R338 Other retention of urine: Secondary | ICD-10-CM | POA: Diagnosis not present

## 2021-12-01 DIAGNOSIS — I10 Essential (primary) hypertension: Secondary | ICD-10-CM | POA: Diagnosis not present

## 2021-12-05 ENCOUNTER — Telehealth: Payer: Self-pay | Admitting: Internal Medicine

## 2021-12-05 NOTE — Telephone Encounter (Signed)
Last Ov 03/05/21 ?Please advise  ?

## 2021-12-05 NOTE — Telephone Encounter (Signed)
Physical therapy 1x9

## 2021-12-06 NOTE — Telephone Encounter (Signed)
Spoke with Tresa Endo and gave verbal orders from message on 11/27/21 she was also informed that patient needs appt

## 2021-12-23 ENCOUNTER — Telehealth: Payer: Self-pay | Admitting: Internal Medicine

## 2021-12-23 NOTE — Telephone Encounter (Signed)
Left message for patient to call back and schedule Medicare Annual Wellness Visit (AWV) either virtually or in office. Left  my Zachery Conch number [phone]   Last AWV 12/20/14 ; please schedule at anytime with [facility] Nurse Health Advisor 1 or 2

## 2021-12-25 NOTE — Telephone Encounter (Signed)
I am okay with the physical therapy but  Report to home health that I have not seen this patient face-to-face since August 2022 and these orders may not be approved.  Please have her make a visit appointment with me continue with these orders

## 2021-12-26 NOTE — Telephone Encounter (Signed)
Verbal order given  

## 2021-12-30 DIAGNOSIS — L853 Xerosis cutis: Secondary | ICD-10-CM | POA: Diagnosis not present

## 2021-12-30 DIAGNOSIS — L570 Actinic keratosis: Secondary | ICD-10-CM | POA: Diagnosis not present

## 2021-12-31 DIAGNOSIS — I6523 Occlusion and stenosis of bilateral carotid arteries: Secondary | ICD-10-CM | POA: Diagnosis not present

## 2021-12-31 DIAGNOSIS — M8008XD Age-related osteoporosis with current pathological fracture, vertebra(e), subsequent encounter for fracture with routine healing: Secondary | ICD-10-CM | POA: Diagnosis not present

## 2021-12-31 DIAGNOSIS — M80052D Age-related osteoporosis with current pathological fracture, left femur, subsequent encounter for fracture with routine healing: Secondary | ICD-10-CM | POA: Diagnosis not present

## 2021-12-31 DIAGNOSIS — E785 Hyperlipidemia, unspecified: Secondary | ICD-10-CM | POA: Diagnosis not present

## 2021-12-31 DIAGNOSIS — I951 Orthostatic hypotension: Secondary | ICD-10-CM | POA: Diagnosis not present

## 2021-12-31 DIAGNOSIS — I252 Old myocardial infarction: Secondary | ICD-10-CM | POA: Diagnosis not present

## 2021-12-31 DIAGNOSIS — E039 Hypothyroidism, unspecified: Secondary | ICD-10-CM | POA: Diagnosis not present

## 2021-12-31 DIAGNOSIS — D62 Acute posthemorrhagic anemia: Secondary | ICD-10-CM | POA: Diagnosis not present

## 2021-12-31 DIAGNOSIS — D539 Nutritional anemia, unspecified: Secondary | ICD-10-CM | POA: Diagnosis not present

## 2021-12-31 DIAGNOSIS — H9190 Unspecified hearing loss, unspecified ear: Secondary | ICD-10-CM | POA: Diagnosis not present

## 2021-12-31 DIAGNOSIS — I10 Essential (primary) hypertension: Secondary | ICD-10-CM | POA: Diagnosis not present

## 2021-12-31 DIAGNOSIS — E876 Hypokalemia: Secondary | ICD-10-CM | POA: Diagnosis not present

## 2021-12-31 DIAGNOSIS — E870 Hyperosmolality and hypernatremia: Secondary | ICD-10-CM | POA: Diagnosis not present

## 2021-12-31 DIAGNOSIS — I2510 Atherosclerotic heart disease of native coronary artery without angina pectoris: Secondary | ICD-10-CM | POA: Diagnosis not present

## 2021-12-31 DIAGNOSIS — K5900 Constipation, unspecified: Secondary | ICD-10-CM | POA: Diagnosis not present

## 2021-12-31 DIAGNOSIS — R338 Other retention of urine: Secondary | ICD-10-CM | POA: Diagnosis not present

## 2022-01-15 ENCOUNTER — Telehealth: Payer: Self-pay

## 2022-01-15 NOTE — Telephone Encounter (Signed)
Left voicemail for patient to call the office to schedule an office visit 

## 2022-01-17 NOTE — Telephone Encounter (Signed)
Pt is sch for 01/28/2022

## 2022-01-28 ENCOUNTER — Ambulatory Visit (INDEPENDENT_AMBULATORY_CARE_PROVIDER_SITE_OTHER): Payer: Medicare Other | Admitting: Internal Medicine

## 2022-01-28 ENCOUNTER — Encounter: Payer: Self-pay | Admitting: Internal Medicine

## 2022-01-28 VITALS — BP 162/78 | HR 110 | Temp 98.0°F | Ht 61.0 in | Wt 102.2 lb

## 2022-01-28 DIAGNOSIS — M899 Disorder of bone, unspecified: Secondary | ICD-10-CM

## 2022-01-28 DIAGNOSIS — M949 Disorder of cartilage, unspecified: Secondary | ICD-10-CM | POA: Diagnosis not present

## 2022-01-28 DIAGNOSIS — R030 Elevated blood-pressure reading, without diagnosis of hypertension: Secondary | ICD-10-CM

## 2022-01-28 DIAGNOSIS — H9193 Unspecified hearing loss, bilateral: Secondary | ICD-10-CM

## 2022-01-28 DIAGNOSIS — D649 Anemia, unspecified: Secondary | ICD-10-CM

## 2022-01-28 DIAGNOSIS — S32000D Wedge compression fracture of unspecified lumbar vertebra, subsequent encounter for fracture with routine healing: Secondary | ICD-10-CM

## 2022-01-28 DIAGNOSIS — Z79899 Other long term (current) drug therapy: Secondary | ICD-10-CM | POA: Diagnosis not present

## 2022-01-28 DIAGNOSIS — M8000XD Age-related osteoporosis with current pathological fracture, unspecified site, subsequent encounter for fracture with routine healing: Secondary | ICD-10-CM | POA: Diagnosis not present

## 2022-01-28 LAB — VITAMIN D 25 HYDROXY (VIT D DEFICIENCY, FRACTURES): VITD: 52.45 ng/mL (ref 30.00–100.00)

## 2022-01-28 LAB — BASIC METABOLIC PANEL
BUN: 19 mg/dL (ref 6–23)
CO2: 29 mEq/L (ref 19–32)
Calcium: 9.9 mg/dL (ref 8.4–10.5)
Chloride: 99 mEq/L (ref 96–112)
Creatinine, Ser: 0.53 mg/dL (ref 0.40–1.20)
GFR: 84.72 mL/min (ref >60.00)
Glucose, Bld: 108 mg/dL — ABNORMAL HIGH (ref 70–99)
Potassium: 4.5 mEq/L (ref 3.5–5.1)
Sodium: 135 mEq/L (ref 135–145)

## 2022-01-28 LAB — CBC WITH DIFFERENTIAL/PLATELET
Basophils Absolute: 0 10*3/uL (ref 0.0–0.1)
Basophils Relative: 0.3 % (ref 0.0–3.0)
Eosinophils Absolute: 0 10*3/uL (ref 0.0–0.7)
Eosinophils Relative: 0.5 % (ref 0.0–5.0)
HCT: 37.7 % (ref 36.0–46.0)
Hemoglobin: 12.8 g/dL (ref 12.0–15.0)
Lymphocytes Relative: 28.4 % (ref 12.0–46.0)
Lymphs Abs: 1.1 10*3/uL (ref 0.7–4.0)
MCHC: 34 g/dL (ref 30.0–36.0)
MCV: 104 fl — ABNORMAL HIGH (ref 78.0–100.0)
Monocytes Absolute: 0.3 10*3/uL (ref 0.1–1.0)
Monocytes Relative: 8.6 % (ref 3.0–12.0)
Neutro Abs: 2.4 10*3/uL (ref 1.4–7.7)
Neutrophils Relative %: 62.2 % (ref 43.0–77.0)
Platelets: 309 10*3/uL (ref 150.0–400.0)
RBC: 3.62 Mil/uL — ABNORMAL LOW (ref 3.87–5.11)
RDW: 14.3 % (ref 11.5–15.5)
WBC: 3.8 10*3/uL — ABNORMAL LOW (ref 4.0–10.5)

## 2022-01-28 NOTE — Progress Notes (Signed)
Chief Complaint  Patient presents with   Follow-up    HPI: Natasha Harper 87 y.o. come in for Chronic disease management  last seen over 8 mos ago  'she sustained fractiur left hip  s/p orif after a fall in the bathroom.  Since then she has done well with this up and around using a walker physical therapy is getting to have her use a cane.  Her husband at home is been the big helper and adapting the home to prevent further problems.  She states her hip does not hurt but her back still hurts from the compression fracture she sustained in October 2022.  She is taking vitamin D but does not know how much her husband puts out the medications She has seen Dr. Dutch Quint and feels good about his team in regard to her spine fracture  Blood pressure has been good 07/27/1928 range when physical therapist checks that she has a known diagnosis of whitecoat hypertension.  She attributes elevated blood pressure today to that. She is very hard of hearing but can hear well on the phone HLD cariotid dis  Thyroid no change in medication reported.  Of note she has had a past history of being "allergic" to the osteoporosis medicine these appear to be the bisphosphonates.  ROS: See pertinent positives and negatives per HPI.  Past Medical History:  Diagnosis Date   CAD (coronary artery disease)    PCI to RCA and diagonal in remote past, residual 70% LAD  /   nuclear, 2007, no ischemia   Carotid arterial disease (HCC)    Doppler, June, 2011, stable, 60-79% R. ICA, 40-59% LICA   Contact lens/glasses fitting    wears contacts or glasses   Dyslipidemia    Significant drop in LDL from Lipitor even though LDL remains high   Ejection fraction    EF 65%, nuclear, 2007   Fibrocystic breast    HTN (hypertension)     no med   Hypercholesterolemia    Hypothyroidism    Thyroid surgery in the past, thyroid nodules followed by Dr.Ellison   Lump or mass in breast 07/19/2007   Excised 01/12/13. B9 on pathQualifier:  Diagnosis of  By: Fabian Sharp MD, Neta Mends     Osteoporosis    Prominent abdominal aortic pulsation    No abdominal aneurysm by ultrasound   Rectal fissure     Family History  Problem Relation Age of Onset   Heart attack Father    Osteoporosis Mother    Breast cancer Daughter     Social History   Socioeconomic History   Marital status: Married    Spouse name: Not on file   Number of children: Not on file   Years of education: Not on file   Highest education level: Not on file  Occupational History   Not on file  Tobacco Use   Smoking status: Former    Types: Cigarettes    Quit date: 07/07/1958    Years since quitting: 63.6   Smokeless tobacco: Never  Substance and Sexual Activity   Alcohol use: No   Drug use: No   Sexual activity: Yes    Partners: Male  Other Topics Concern   Not on file  Social History Narrative   HH     Of 2  No pets    Family from [place]    Exercise   Walks every day at least 30 minutes  2 miles .   Sleep  Ok 10 -  7    Just moved to [facility]   Financial Resource Strain: Not on file  Food Insecurity: Not on file  Transportation Needs: Not on file  Physical Activity: Not on file  Stress: Not on file  Social Connections: Not on file    Outpatient Medications Prior to Visit  Medication Sig Dispense Refill   acetaminophen (TYLENOL) 500 MG tablet Take 1,000 mg by mouth in the morning and at bedtime.     amLODipine (NORVASC) 5 MG tablet Take 1 tablet (5 mg total) by mouth daily. 30 tablet 3   aspirin EC 325 MG EC tablet Take 1 tablet (325 mg total) by mouth daily with breakfast. 30 tablet 0   atorvastatin (LIPITOR) 80 MG tablet TAKE 1 TABLET BY MOUTH DAILY 90 tablet 3   fish oil-omega-3 fatty acids 1000 MG capsule Take 1,000 mg by mouth daily.     folic acid (FOLVITE) 1 MG tablet Take 1 tablet (1 mg total) by mouth daily. 30 tablet 0   HYDROcodone-acetaminophen (NORCO/VICODIN) 5-325 MG tablet Take 1-2 tablets by  mouth every 4 (four) hours as needed for moderate pain (pain score 4-6). 30 tablet 0   lidocaine (LIDODERM) 5 % Place 1 patch onto the skin daily. Remove & Discard patch within 12 hours or as directed by MD (Patient taking differently: Place 1 patch onto the skin daily as needed (pain). Remove & Discard patch within 12 hours or as directed by MD) 30 patch 0   losartan (COZAAR) 50 MG tablet Take 1 tablet (50 mg total) by mouth daily. 30 tablet 0   MAGNESIUM PO Take 1 tablet by mouth daily at 12 noon.     Menaquinone-7 (VITAMIN K2 PO) Take 1 tablet by mouth daily. Vitamin D3     MULTIPLE VITAMIN PO Take 1 tablet by mouth daily.     polyethylene glycol (MIRALAX / GLYCOLAX) 17 g packet Take 17 g by mouth daily as needed for mild constipation. Can take up to twice a day for constipation. 30 each 3   SYNTHROID 50 MCG tablet TAKE 1 TABLET BY MOUTH DAILY 90 tablet 3   feeding supplement (ENSURE ENLIVE / ENSURE PLUS) LIQD Take 237 mLs by mouth 2 (two) times daily between meals. (Patient not taking: Reported on 01/28/2022)     No facility-administered medications prior to visit.     EXAM:  BP (!) 162/78 (BP Location: Right Arm)   Pulse (!) 110   Temp 98 F (36.7 C) (Oral)   Ht 5\' 1"  (1.549 m)   Wt 102 lb 3.2 oz (46.4 kg)   LMP 07/07/1997   SpO2 96%   BMI 19.31 kg/m   Body mass index is 19.31 kg/m. Blood pressure reported 127/78 range this week. GENERAL: vitals reviewed and listed above, alert, oriented, appears well hydrated and in no acute distress with a walker hard of hearing but normal conversation otherwise HEENT: atraumatic, conjunctiva  clear, no obvious abnormalities on inspection of external nose and ears  NECK: no obvious masses on inspection palpation  LUNGS: clear to auscultation bilaterally, no wheezes, rales or rhonchi, good air movement mild to moderate kyphosis CV: HRRR, no clubbing cyanosis or  peripheral edema nl cap refill  MS: moves all extremities without noticeable focal   abnormality able to arise from chair and use walker skillfully.  No specific midline pain noted. PSYCH: pleasant and cooperative, Lab Results  Component Value Date   WBC 3.8 (L) 01/28/2022  HGB 12.8 01/28/2022   HCT 37.7 01/28/2022   PLT 309.0 01/28/2022   GLUCOSE 108 (H) 01/28/2022   CHOL 181 07/06/2021   TRIG 47 07/06/2021   HDL 56 07/06/2021   LDLDIRECT 152.1 01/16/2010   LDLCALC 116 (H) 07/06/2021   ALT 40 07/08/2021   AST 44 (H) 07/08/2021   NA 135 01/28/2022   K 4.5 01/28/2022   CL 99 01/28/2022   CREATININE 0.53 01/28/2022   BUN 19 01/28/2022   CO2 29 01/28/2022   TSH 0.913 09/08/2021   INR 1.1 07/05/2021   HGBA1C 5.6 11/25/2017   BP Readings from Last 3 Encounters:  01/28/22 (!) 162/78  09/10/21 135/74  07/24/21 (!) 157/65    ASSESSMENT AND PLAN:  Discussed the following assessment and plan:  Osteoporosis with current pathological fracture with routine healing, unspecified osteoporosis type, subsequent encounter - Plan: CBC with Differential/Platelet, Basic metabolic panel, Vitamin D, 25-hydroxy, Vitamin D, 25-hydroxy, Basic metabolic panel, CBC with Differential/Platelet  Anemia, unspecified type post op - post op  recheck - Plan: CBC with Differential/Platelet, Basic metabolic panel, Vitamin D, 25-hydroxy, Vitamin D, 25-hydroxy, Basic metabolic panel, CBC with Differential/Platelet  Medication management - Plan: CBC with Differential/Platelet, Basic metabolic panel, Vitamin D, 25-hydroxy, Vitamin D, 25-hydroxy, Basic metabolic panel, CBC with Differential/Platelet  Disorder of bone and cartilage - Plan: CBC with Differential/Platelet, Basic metabolic panel, Vitamin D, 25-hydroxy, Vitamin D, 25-hydroxy, Basic metabolic panel, CBC with Differential/Platelet  Compression fracture of lumbar vertebra with routine healing, unspecified lumbar vertebral level, subsequent encounter - Plan: CBC with Differential/Platelet, Basic metabolic panel, Vitamin D, 25-hydroxy,  Vitamin D, 25-hydroxy, Basic metabolic panel, CBC with Differential/Platelet  HYPERTENSION, WHITE COAT - reports nl at home and with pt in 130 range  or better   highest is 140 ar most  Bilateral hearing loss, unspecified hearing loss type She has now had 2 fractures in the last year 1 compression fractures 2 after a fall with hip Has a history of intolerance or "allergy" as she calls it to bisphosphonate. She is trying to optimize vitamin D I have told her I would like her to see an endocrinologist or someone experience with osteoporosis as a consult to see if there are other options that are available to her for fracture prevention it is also acceptable to her.  Follow-up labs today to ensure anemia has improved and referral to endocrinology for consult about osteoporosis.  Blood pressure in the office is elevated we have done this before and vetted where her home readings are in a adequate range and she reports that her physical therapist says it is in an adequate range.  So no further work-up for this.  And continue medication. -Patient advised to return or notify health care team  if  new concerns arise.  Patient Instructions  Good to see you today .   Lab to make sure  your anemia is better .  And vit d level is ol . You will be contacted about a appt with   a osteoporosis   specialist to see if any other   help to prevent  future fractures.        Neta Mends. Kylen Schliep M.D.

## 2022-01-28 NOTE — Patient Instructions (Addendum)
Good to see you today .   Lab to make sure  your anemia is better .  And vit d level is ol . You will be contacted about a appt with   a osteoporosis   specialist to see if any other   help to prevent  future fractures.

## 2022-01-28 NOTE — Progress Notes (Signed)
Anemia is much better   now in normal range,  vit d in good range  . Continue vit D supplement.

## 2022-01-30 DIAGNOSIS — I951 Orthostatic hypotension: Secondary | ICD-10-CM | POA: Diagnosis not present

## 2022-01-30 DIAGNOSIS — D539 Nutritional anemia, unspecified: Secondary | ICD-10-CM | POA: Diagnosis not present

## 2022-01-30 DIAGNOSIS — H9190 Unspecified hearing loss, unspecified ear: Secondary | ICD-10-CM | POA: Diagnosis not present

## 2022-01-30 DIAGNOSIS — M80052D Age-related osteoporosis with current pathological fracture, left femur, subsequent encounter for fracture with routine healing: Secondary | ICD-10-CM | POA: Diagnosis not present

## 2022-01-30 DIAGNOSIS — D62 Acute posthemorrhagic anemia: Secondary | ICD-10-CM | POA: Diagnosis not present

## 2022-01-30 DIAGNOSIS — E039 Hypothyroidism, unspecified: Secondary | ICD-10-CM | POA: Diagnosis not present

## 2022-01-30 DIAGNOSIS — E870 Hyperosmolality and hypernatremia: Secondary | ICD-10-CM | POA: Diagnosis not present

## 2022-01-30 DIAGNOSIS — I10 Essential (primary) hypertension: Secondary | ICD-10-CM | POA: Diagnosis not present

## 2022-01-30 DIAGNOSIS — K5900 Constipation, unspecified: Secondary | ICD-10-CM | POA: Diagnosis not present

## 2022-01-30 DIAGNOSIS — I6523 Occlusion and stenosis of bilateral carotid arteries: Secondary | ICD-10-CM | POA: Diagnosis not present

## 2022-01-30 DIAGNOSIS — M8008XD Age-related osteoporosis with current pathological fracture, vertebra(e), subsequent encounter for fracture with routine healing: Secondary | ICD-10-CM | POA: Diagnosis not present

## 2022-01-30 DIAGNOSIS — E876 Hypokalemia: Secondary | ICD-10-CM | POA: Diagnosis not present

## 2022-01-30 DIAGNOSIS — I252 Old myocardial infarction: Secondary | ICD-10-CM | POA: Diagnosis not present

## 2022-01-30 DIAGNOSIS — R338 Other retention of urine: Secondary | ICD-10-CM | POA: Diagnosis not present

## 2022-01-30 DIAGNOSIS — E785 Hyperlipidemia, unspecified: Secondary | ICD-10-CM | POA: Diagnosis not present

## 2022-01-30 DIAGNOSIS — I2510 Atherosclerotic heart disease of native coronary artery without angina pectoris: Secondary | ICD-10-CM | POA: Diagnosis not present

## 2022-01-31 ENCOUNTER — Telehealth: Payer: Self-pay | Admitting: Internal Medicine

## 2022-01-31 NOTE — Telephone Encounter (Signed)
Natasha Harper from [facility] call and stated she need verbal orders for PT 1 x a wk for 8 wk's to work on her balance,strength and ambulation.Natasha Harper stated you can leave a message .

## 2022-02-02 NOTE — Telephone Encounter (Signed)
Pt orders approved

## 2022-02-03 NOTE — Telephone Encounter (Signed)
Called and left Natasha Harper at [facility] a message in reference to verbals orders

## 2022-02-07 ENCOUNTER — Telehealth: Payer: Self-pay | Admitting: Internal Medicine

## 2022-02-07 NOTE — Telephone Encounter (Signed)
Spoke to patient to schedule AWV.  Patient declined  not interested

## 2022-02-21 NOTE — Telephone Encounter (Signed)
error 

## 2022-02-28 ENCOUNTER — Other Ambulatory Visit: Payer: Self-pay

## 2022-02-28 NOTE — Patient Outreach (Signed)
  Care Coordination   02/28/2022 Name: Natasha Harper MRN: [id number] DOB: 04-10-1935   Care Coordination Outreach Attempts:  An unsuccessful telephone outreach was attempted today to offer the patient information about available care coordination services as a benefit of their health plan.   Follow Up Plan:  Additional outreach attempts will be made to offer the patient care coordination information and services.   Encounter Outcome:  No Answer  Care Coordination Interventions Activated:  No   Care Coordination Interventions:  No, not indicated   Dudley Major RN, BSN,CCM, CDE Care Management Coordinator [facility] Care Management [phone]

## 2022-03-01 DIAGNOSIS — E870 Hyperosmolality and hypernatremia: Secondary | ICD-10-CM | POA: Diagnosis not present

## 2022-03-01 DIAGNOSIS — D539 Nutritional anemia, unspecified: Secondary | ICD-10-CM | POA: Diagnosis not present

## 2022-03-01 DIAGNOSIS — D62 Acute posthemorrhagic anemia: Secondary | ICD-10-CM | POA: Diagnosis not present

## 2022-03-01 DIAGNOSIS — M8008XD Age-related osteoporosis with current pathological fracture, vertebra(e), subsequent encounter for fracture with routine healing: Secondary | ICD-10-CM | POA: Diagnosis not present

## 2022-03-01 DIAGNOSIS — E039 Hypothyroidism, unspecified: Secondary | ICD-10-CM | POA: Diagnosis not present

## 2022-03-01 DIAGNOSIS — E785 Hyperlipidemia, unspecified: Secondary | ICD-10-CM | POA: Diagnosis not present

## 2022-03-01 DIAGNOSIS — I2510 Atherosclerotic heart disease of native coronary artery without angina pectoris: Secondary | ICD-10-CM | POA: Diagnosis not present

## 2022-03-01 DIAGNOSIS — K5900 Constipation, unspecified: Secondary | ICD-10-CM | POA: Diagnosis not present

## 2022-03-01 DIAGNOSIS — I951 Orthostatic hypotension: Secondary | ICD-10-CM | POA: Diagnosis not present

## 2022-03-01 DIAGNOSIS — H9190 Unspecified hearing loss, unspecified ear: Secondary | ICD-10-CM | POA: Diagnosis not present

## 2022-03-01 DIAGNOSIS — I10 Essential (primary) hypertension: Secondary | ICD-10-CM | POA: Diagnosis not present

## 2022-03-01 DIAGNOSIS — R338 Other retention of urine: Secondary | ICD-10-CM | POA: Diagnosis not present

## 2022-03-01 DIAGNOSIS — I6523 Occlusion and stenosis of bilateral carotid arteries: Secondary | ICD-10-CM | POA: Diagnosis not present

## 2022-03-01 DIAGNOSIS — I252 Old myocardial infarction: Secondary | ICD-10-CM | POA: Diagnosis not present

## 2022-03-01 DIAGNOSIS — M80052D Age-related osteoporosis with current pathological fracture, left femur, subsequent encounter for fracture with routine healing: Secondary | ICD-10-CM | POA: Diagnosis not present

## 2022-03-01 DIAGNOSIS — E876 Hypokalemia: Secondary | ICD-10-CM | POA: Diagnosis not present

## 2022-03-12 ENCOUNTER — Telehealth: Payer: Self-pay

## 2022-03-12 NOTE — Patient Outreach (Signed)
  Care Coordination   03/12/2022 Name: Natasha Harper MRN: [id number] DOB: [birth year]   Care Coordination Outreach Attempts:  A second unsuccessful outreach was attempted today to offer the patient with information about available care coordination services as a benefit of their health plan.     Follow Up Plan:  Additional outreach attempts will be made to offer the patient care coordination information and services.   Encounter Outcome:  No Answer  Care Coordination Interventions Activated:  No   Care Coordination Interventions:  No, not indicated    Dudley Major RN, BSN,CCM, CDE Care Management Coordinator [facility] Care Management [phone]

## 2022-03-24 ENCOUNTER — Ambulatory Visit: Payer: Self-pay

## 2022-03-24 NOTE — Patient Instructions (Signed)
Visit Information  Thank you for taking time to visit with me today. Please don't hesitate to contact me if I can be of assistance to you.   Following are the goals we discussed today:   Goals Addressed             This Visit's Progress    Care Coordination Activities       Care Coordination Interventions: Discussed patient is doing well in the home. Currently active with [facility] Education provided on role of care coordination team - no follow up desired at this time Encouraged patient to contact primary care provider as needed        Please call the care guide team at [phone] if you need to schedule an appointment with our care coordination team  If you are experiencing a Mental Health or [place] or need someone to talk to, please go to [facility] [address], [place] [phone])  Patient verbalizes understanding of instructions and care plan provided today and agrees to view in [place]. Active MyChart status and patient understanding of how to access instructions and care plan via MyChart confirmed with patient.     No further follow up required: Please contact your primary care provider as needed  Daneen Schick, Arita Miss, CDP Social Worker, Certified Dementia Practitioner [place] Coordination [phone]

## 2022-03-24 NOTE — Patient Outreach (Signed)
  Care Coordination   Initial Visit Note   03/24/2022 Name: CORINDA AMMON MRN: [id number] DOB: [birth year]  PANDORA MCCRACKIN is a 87 y.o. year old female who sees Panosh, Standley Brooking, MD for primary care. I  spoke with patients spouse by phone today.  What matters to the patients health and wellness today?  Doing well, no concerns at this time    Goals Addressed             This Visit's Progress    Care Coordination Activities       Care Coordination Interventions: Discussed patient is doing well in the home. Currently active with [facility] Education provided on role of care coordination team - no follow up desired at this time Encouraged patient to contact primary care provider as needed        SDOH assessments and interventions completed:  Yes  SDOH Interventions Today    Flowsheet Row Most Recent Value  SDOH Interventions   Food Insecurity Interventions Intervention Not Indicated  Housing Interventions Intervention Not Indicated  Transportation Interventions Intervention Not Indicated        Care Coordination Interventions Activated:  Yes  Care Coordination Interventions:  Yes, provided   Follow up plan: No further intervention required.   Encounter Outcome:  Pt. Visit Completed   Daneen Schick, BSW, CDP Social Worker, Certified Dementia Practitioner [place] Management  Care Coordination [phone]

## 2022-03-26 DIAGNOSIS — S22070A Wedge compression fracture of T9-T10 vertebra, initial encounter for closed fracture: Secondary | ICD-10-CM | POA: Diagnosis not present

## 2022-03-26 DIAGNOSIS — S22080A Wedge compression fracture of T11-T12 vertebra, initial encounter for closed fracture: Secondary | ICD-10-CM | POA: Diagnosis not present

## 2022-03-28 ENCOUNTER — Telehealth: Payer: Self-pay | Admitting: Internal Medicine

## 2022-03-28 DIAGNOSIS — S72002A Fracture of unspecified part of neck of left femur, initial encounter for closed fracture: Secondary | ICD-10-CM

## 2022-03-28 NOTE — Telephone Encounter (Signed)
Natasha Harper PT with [facility] is calling and would like PT orders for 1X9

## 2022-03-31 DIAGNOSIS — K5900 Constipation, unspecified: Secondary | ICD-10-CM | POA: Diagnosis not present

## 2022-03-31 DIAGNOSIS — I951 Orthostatic hypotension: Secondary | ICD-10-CM | POA: Diagnosis not present

## 2022-03-31 DIAGNOSIS — I10 Essential (primary) hypertension: Secondary | ICD-10-CM | POA: Diagnosis not present

## 2022-03-31 DIAGNOSIS — E785 Hyperlipidemia, unspecified: Secondary | ICD-10-CM | POA: Diagnosis not present

## 2022-03-31 DIAGNOSIS — E876 Hypokalemia: Secondary | ICD-10-CM | POA: Diagnosis not present

## 2022-03-31 DIAGNOSIS — I252 Old myocardial infarction: Secondary | ICD-10-CM | POA: Diagnosis not present

## 2022-03-31 DIAGNOSIS — E870 Hyperosmolality and hypernatremia: Secondary | ICD-10-CM | POA: Diagnosis not present

## 2022-03-31 DIAGNOSIS — E039 Hypothyroidism, unspecified: Secondary | ICD-10-CM | POA: Diagnosis not present

## 2022-03-31 DIAGNOSIS — H9190 Unspecified hearing loss, unspecified ear: Secondary | ICD-10-CM | POA: Diagnosis not present

## 2022-03-31 DIAGNOSIS — R338 Other retention of urine: Secondary | ICD-10-CM | POA: Diagnosis not present

## 2022-03-31 DIAGNOSIS — I2510 Atherosclerotic heart disease of native coronary artery without angina pectoris: Secondary | ICD-10-CM | POA: Diagnosis not present

## 2022-03-31 DIAGNOSIS — M8008XD Age-related osteoporosis with current pathological fracture, vertebra(e), subsequent encounter for fracture with routine healing: Secondary | ICD-10-CM | POA: Diagnosis not present

## 2022-03-31 DIAGNOSIS — I6523 Occlusion and stenosis of bilateral carotid arteries: Secondary | ICD-10-CM | POA: Diagnosis not present

## 2022-03-31 DIAGNOSIS — D539 Nutritional anemia, unspecified: Secondary | ICD-10-CM | POA: Diagnosis not present

## 2022-03-31 DIAGNOSIS — M80052D Age-related osteoporosis with current pathological fracture, left femur, subsequent encounter for fracture with routine healing: Secondary | ICD-10-CM | POA: Diagnosis not present

## 2022-03-31 DIAGNOSIS — D62 Acute posthemorrhagic anemia: Secondary | ICD-10-CM | POA: Diagnosis not present

## 2022-03-31 NOTE — Telephone Encounter (Signed)
Ok to approve orders for PTas requested

## 2022-04-11 NOTE — Telephone Encounter (Signed)
Contact Kelly. Left a voicemail to call us back.

## 2022-04-11 NOTE — Telephone Encounter (Signed)
Approved verbal order was inform to [place].

## 2022-04-30 DIAGNOSIS — I951 Orthostatic hypotension: Secondary | ICD-10-CM | POA: Diagnosis not present

## 2022-04-30 DIAGNOSIS — H9190 Unspecified hearing loss, unspecified ear: Secondary | ICD-10-CM | POA: Diagnosis not present

## 2022-04-30 DIAGNOSIS — E039 Hypothyroidism, unspecified: Secondary | ICD-10-CM | POA: Diagnosis not present

## 2022-04-30 DIAGNOSIS — D62 Acute posthemorrhagic anemia: Secondary | ICD-10-CM | POA: Diagnosis not present

## 2022-04-30 DIAGNOSIS — I252 Old myocardial infarction: Secondary | ICD-10-CM | POA: Diagnosis not present

## 2022-04-30 DIAGNOSIS — D539 Nutritional anemia, unspecified: Secondary | ICD-10-CM | POA: Diagnosis not present

## 2022-04-30 DIAGNOSIS — M80052D Age-related osteoporosis with current pathological fracture, left femur, subsequent encounter for fracture with routine healing: Secondary | ICD-10-CM | POA: Diagnosis not present

## 2022-04-30 DIAGNOSIS — I6523 Occlusion and stenosis of bilateral carotid arteries: Secondary | ICD-10-CM | POA: Diagnosis not present

## 2022-04-30 DIAGNOSIS — I10 Essential (primary) hypertension: Secondary | ICD-10-CM | POA: Diagnosis not present

## 2022-04-30 DIAGNOSIS — R338 Other retention of urine: Secondary | ICD-10-CM | POA: Diagnosis not present

## 2022-04-30 DIAGNOSIS — I2510 Atherosclerotic heart disease of native coronary artery without angina pectoris: Secondary | ICD-10-CM | POA: Diagnosis not present

## 2022-04-30 DIAGNOSIS — E876 Hypokalemia: Secondary | ICD-10-CM | POA: Diagnosis not present

## 2022-04-30 DIAGNOSIS — E870 Hyperosmolality and hypernatremia: Secondary | ICD-10-CM | POA: Diagnosis not present

## 2022-04-30 DIAGNOSIS — E785 Hyperlipidemia, unspecified: Secondary | ICD-10-CM | POA: Diagnosis not present

## 2022-04-30 DIAGNOSIS — M8008XD Age-related osteoporosis with current pathological fracture, vertebra(e), subsequent encounter for fracture with routine healing: Secondary | ICD-10-CM | POA: Diagnosis not present

## 2022-04-30 DIAGNOSIS — K5900 Constipation, unspecified: Secondary | ICD-10-CM | POA: Diagnosis not present

## 2022-05-27 NOTE — Telephone Encounter (Signed)
Natasha Harper is requesting a referral faxed to [phone]  *PT at [facility].  Pt is being discharged today.

## 2022-05-28 NOTE — Telephone Encounter (Signed)
A PT referral is placed.

## 2022-05-28 NOTE — Telephone Encounter (Signed)
Ok to  do pt referral

## 2022-05-28 NOTE — Addendum Note (Signed)
Addended byVickii Chafe on: 05/28/2022 03:51 PM   Modules accepted: Orders

## 2022-05-30 DIAGNOSIS — H16312 Corneal abscess, left eye: Secondary | ICD-10-CM | POA: Diagnosis not present

## 2022-06-03 ENCOUNTER — Other Ambulatory Visit: Payer: Self-pay

## 2022-06-03 ENCOUNTER — Encounter (HOSPITAL_BASED_OUTPATIENT_CLINIC_OR_DEPARTMENT_OTHER): Payer: Self-pay | Admitting: Emergency Medicine

## 2022-06-03 ENCOUNTER — Emergency Department (HOSPITAL_BASED_OUTPATIENT_CLINIC_OR_DEPARTMENT_OTHER)
Admission: EM | Admit: 2022-06-03 | Discharge: 2022-06-03 | Disposition: A | Discharge status: Home / Self Care | Payer: Medicare Other | Attending: Emergency Medicine | Admitting: Emergency Medicine

## 2022-06-03 DIAGNOSIS — X58XXXA Exposure to other specified factors, initial encounter: Secondary | ICD-10-CM | POA: Insufficient documentation

## 2022-06-03 DIAGNOSIS — T1591XA Foreign body on external eye, part unspecified, right eye, initial encounter: Secondary | ICD-10-CM | POA: Diagnosis not present

## 2022-06-03 DIAGNOSIS — T1501XA Foreign body in cornea, right eye, initial encounter: Secondary | ICD-10-CM | POA: Diagnosis not present

## 2022-06-03 MED ORDER — TETRACAINE HCL 0.5 % OP SOLN
1.0000 [drp] | Freq: Once | OPHTHALMIC | Status: AC
Start: 2022-06-03 — End: 2022-06-03
  Administered 2022-06-03: 1 [drp] via OPHTHALMIC
  Filled 2022-06-03: qty 4

## 2022-06-03 MED ORDER — MOXIFLOXACIN HCL 0.5 % OP SOLN: 1.0000 [drp] | Freq: Three times a day (TID) | OPHTHALMIC | 0 refills | Status: DC

## 2022-06-03 MED ORDER — FLUORESCEIN SODIUM 1 MG OP STRP
1.0000 | ORAL_STRIP | Freq: Once | OPHTHALMIC | Status: AC
  Administered 2022-06-03: 1 via OPHTHALMIC
  Filled 2022-06-03: qty 1

## 2022-06-03 NOTE — ED Provider Notes (Signed)
[facility] EMERGENCY DEPT Provider Note   CSN: [id number] Arrival date & time: 06/03/22  1205     History Chief Complaint  Patient presents with   Eye Problem    HPI Natasha Harper is a 87 y.o. female presenting for chief complaint of foreign body to the right eye.  She states that her contact got stuck underneath her eyelid and she has not been able to get it out over 3 days.  She tried irrigation, went to urgent care who could not fix it.  She denies fevers or chills, nausea vomiting, syncope shortness of breath.  Vision is grossly intact.  No drainage.   Patient's recorded medical, surgical, social, medication list and allergies were reviewed in the Snapshot window as part of the initial history.   Review of Systems   Review of Systems  Constitutional:  Negative for chills and fever.  HENT:  Negative for ear pain and sore throat.   Eyes:  Negative for pain and visual disturbance.  Respiratory:  Negative for cough and shortness of breath.   Cardiovascular:  Negative for chest pain and palpitations.  Gastrointestinal:  Negative for abdominal pain and vomiting.  Genitourinary:  Negative for dysuria and hematuria.  Musculoskeletal:  Negative for arthralgias and back pain.  Skin:  Negative for color change and rash.  Neurological:  Negative for seizures and syncope.  All other systems reviewed and are negative.   Physical Exam Updated Vital Signs BP (!) 178/81 (BP Location: Right Arm)   Pulse 100   Temp 98.5 F (36.9 C) (Oral)   Resp 16   Ht 5\' 1"  (1.549 m)   Wt 50.8 kg   LMP 07/07/1997   SpO2 98%   BMI 21.16 kg/m  Physical Exam Vitals and nursing note reviewed.  Constitutional:      General: She is not in acute distress.    Appearance: She is well-developed.  HENT:     Head: Normocephalic and atraumatic.  Eyes:     Conjunctiva/sclera: Conjunctivae normal.     Comments: Obvious corneal abrasion of the right eye  Cardiovascular:     Rate and  Rhythm: Normal rate and regular rhythm.     Heart sounds: No murmur heard. Pulmonary:     Effort: Pulmonary effort is normal. No respiratory distress.     Breath sounds: Normal breath sounds.  Abdominal:     General: There is no distension.     Palpations: Abdomen is soft.     Tenderness: There is no abdominal tenderness. There is no right CVA tenderness or left CVA tenderness.  Musculoskeletal:        General: No swelling or tenderness. Normal range of motion.     Cervical back: Neck supple.  Skin:    General: Skin is warm and dry.  Neurological:     General: No focal deficit present.     Mental Status: She is alert and oriented to person, place, and time. Mental status is at baseline.     Cranial Nerves: No cranial nerve deficit.      ED Course/ Medical Decision Making/ A&P    Procedures Procedures   Medications Ordered in ED Medications  tetracaine (PONTOCAINE) 0.5 % ophthalmic solution 1 drop (1 drop Right Eye Given 06/03/22 1504)  fluorescein ophthalmic strip 1 strip (1 strip Left Eye Given 06/03/22 1504)    Medical Decision Making:    KELYSE PASK is a 87 y.o. female who presented to the ED today with foreign  body of the right eye detailed above.     Patient's presentation is complicated by their history of advanced age.  Patient placed on continuous vitals and telemetry monitoring while in ED which was reviewed periodically.   Complete initial physical exam performed, notably the patient  was hemodynamically stable in no acute distress.  Obvious corneal abrasion under fluorescein evaluation Context stuck under her right eyelid.  Using cotton swabs and a suction cup, contact was removed successfully.      Reviewed and confirmed nursing documentation for past medical history, family history, social history.    Following procedure, patient was symptomatically improved.  Given finding of corneal abrasion, will start on moxifloxacin drops and have her follow-up with  his her ophthalmologist.  Disposition:  I have considered need for hospitalization, however, considering all of the above, I believe this patient is stable for discharge at this time.  Patient/family educated about specific return precautions for given chief complaint and symptoms.  Patient/family educated about follow-up with PCP.     Patient/family expressed understanding of return precautions and need for follow-up. Patient spoken to regarding all imaging and laboratory results and appropriate follow up for these results. All education provided in verbal form with additional information in written form. Time was allowed for answering of patient questions. Patient discharged.    Emergency Department Medication Summary:   Medications  tetracaine (PONTOCAINE) 0.5 % ophthalmic solution 1 drop (1 drop Right Eye Given 06/03/22 1504)  fluorescein ophthalmic strip 1 strip (1 strip Left Eye Given 06/03/22 1504)        Clinical Impression:  1. Foreign body of right eye, initial encounter      Discharge   Final Clinical Impression(s) / ED Diagnoses Final diagnoses:  Foreign body of right eye, initial encounter    Rx / DC Orders ED Discharge Orders          Ordered    moxifloxacin (VIGAMOX) 0.5 % ophthalmic solution  3 times daily        06/03/22 1502              Tretha Sciara, MD 06/03/22 1507

## 2022-06-03 NOTE — ED Triage Notes (Signed)
Pt arrives to ED with c/o contact being stuck in right eye x2 days. PCP tried to remove contact this morning and failed to do so.

## 2022-06-11 ENCOUNTER — Telehealth: Payer: Self-pay

## 2022-06-11 NOTE — Telephone Encounter (Signed)
Received a reports from [place], [place] that Tresa Endo PT with [facility] requesting a PT referral send to [facility].   Contacted Tresa Endo and inform her we sent the referral on 05/28/2022. She states patient has not heard from the place.   Tresa Endo said she would contact [facility] again. Told her I will reach out to our referral coordinator.

## 2022-06-16 DIAGNOSIS — H524 Presbyopia: Secondary | ICD-10-CM | POA: Diagnosis not present

## 2022-06-18 NOTE — Telephone Encounter (Signed)
Attempted to patient. Husband answered. He updates they did heard from PT at [facility] but had to postponed it due to patient had issues with her eyes prescription. He states PT [facility] is aware of it and he will call them to update on patient's situation.

## 2022-08-13 DIAGNOSIS — H2513 Age-related nuclear cataract, bilateral: Secondary | ICD-10-CM | POA: Diagnosis not present

## 2022-08-13 DIAGNOSIS — H2512 Age-related nuclear cataract, left eye: Secondary | ICD-10-CM | POA: Diagnosis not present

## 2022-08-13 DIAGNOSIS — H353131 Nonexudative age-related macular degeneration, bilateral, early dry stage: Secondary | ICD-10-CM | POA: Diagnosis not present

## 2022-08-13 DIAGNOSIS — H02831 Dermatochalasis of right upper eyelid: Secondary | ICD-10-CM | POA: Diagnosis not present

## 2022-08-13 DIAGNOSIS — H25013 Cortical age-related cataract, bilateral: Secondary | ICD-10-CM | POA: Diagnosis not present

## 2022-08-22 IMAGING — XA IR KYPHO VERTEBRAL ADDL AGMENTATION
6 of 7 series · 13 of 24 positions shown · non-contrast
Comparison: CT of the thoracic spine July 05, 2021.

INDICATION: 86-year-old female with past medical history significant for
coronary artery disease, carotid disease, dyslipidemia,
hyperthyroidism and osteoporosis presenting with back pain after
fall several days ago. CT of the thoracic spine showed acute
compression fractures of the T1 and T6 vertebral body corresponding
to point of tenderness on physical exam. Patient has persistent pain
despite medical treatment.

EXAM:
FLUOROSCOPY GUIDED T6 CORE BONE BIOPSY AND BALLOON KYPHOPLASTY.

[Series 1: single · 1 of 1 slices shown (1 of 2)]
[im 1/1]
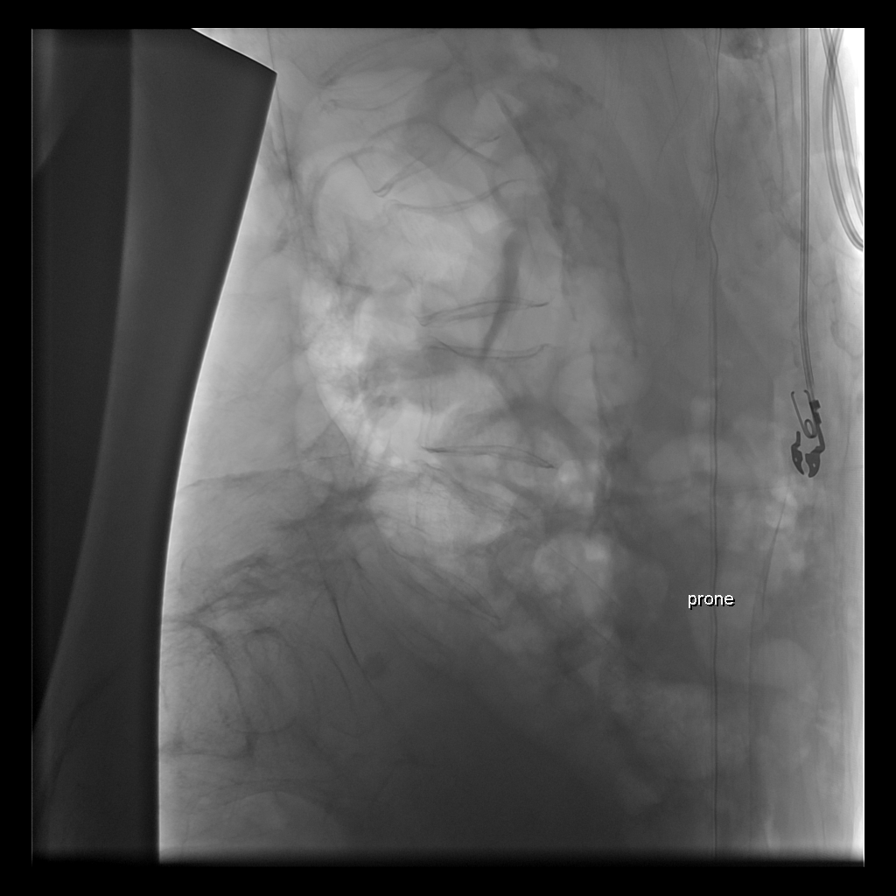

[Series 4: fl neuro n · 2 acquisitions, 4 frames shown (1 of 4)]
[im 1/2]
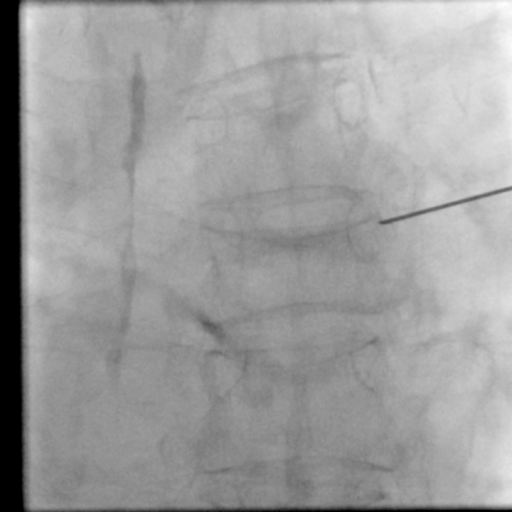
[im 1/2]
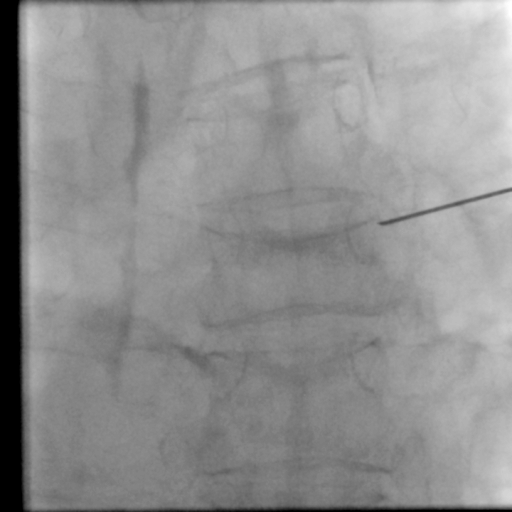
[im 2/2]
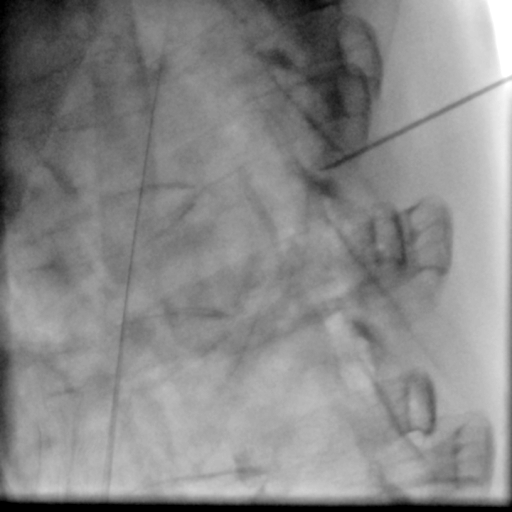
[im 2/2]
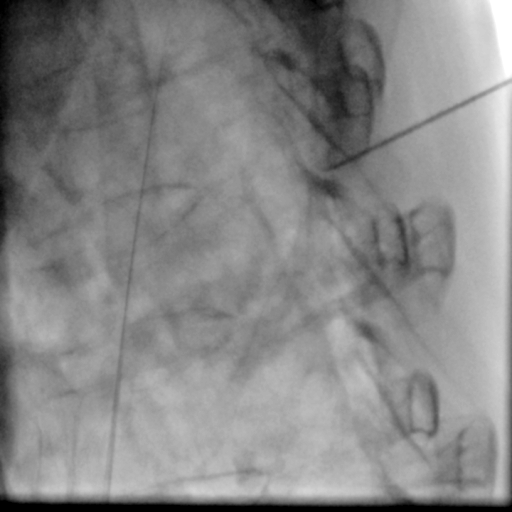

[Series 5: fl neuro n · 2 acquisitions, 3 frames shown (2 of 4)]
[im 1/2]
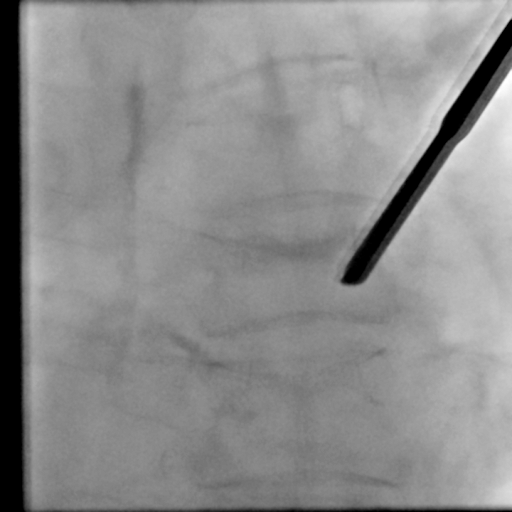
[im 2/2]
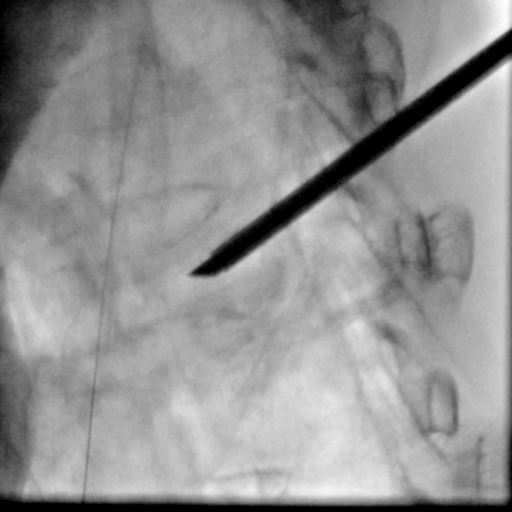
[im 2/2]
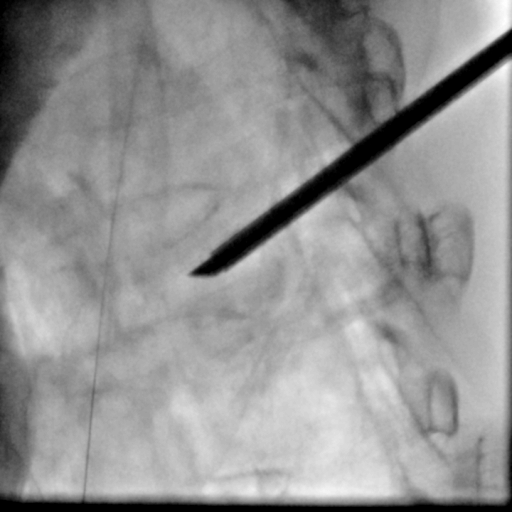

[Series 6: fl neuro n · 1 of 7 frames shown (3 of 4)]
[frame 6/7]
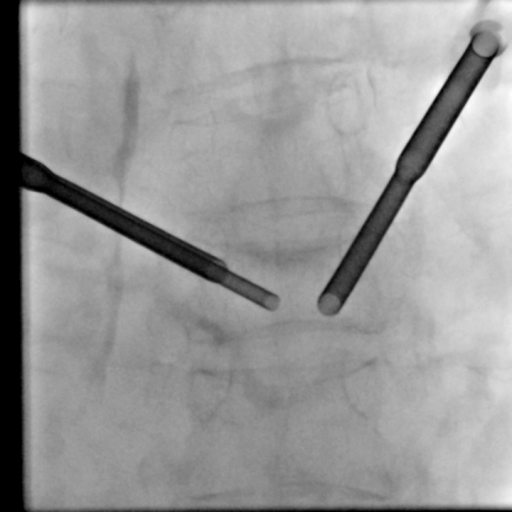

[Series 7: fl neuro n · 2 acquisitions, 3 frames shown (4 of 4)]
[im 1/2]
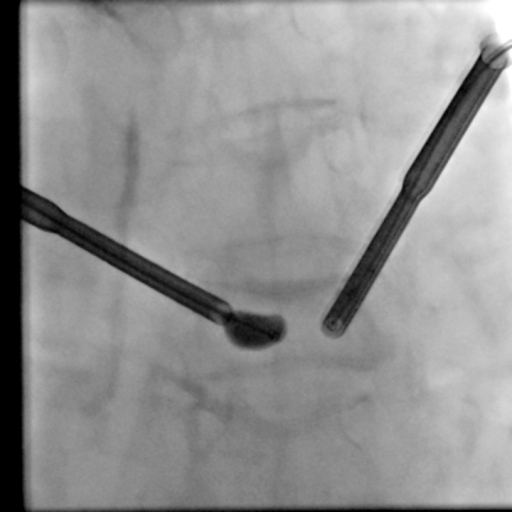
[im 2/2]
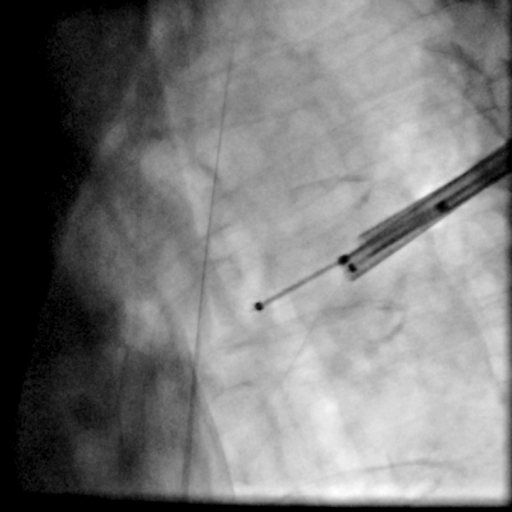
[im 2/2]
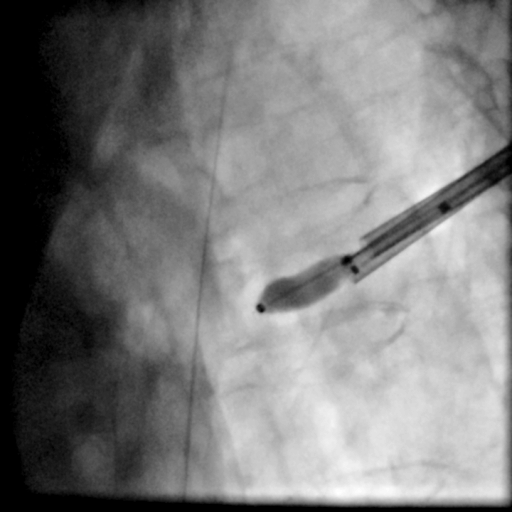

[Series 9: single · 1 of 2 slices shown (2 of 2)]
[im 2/2]
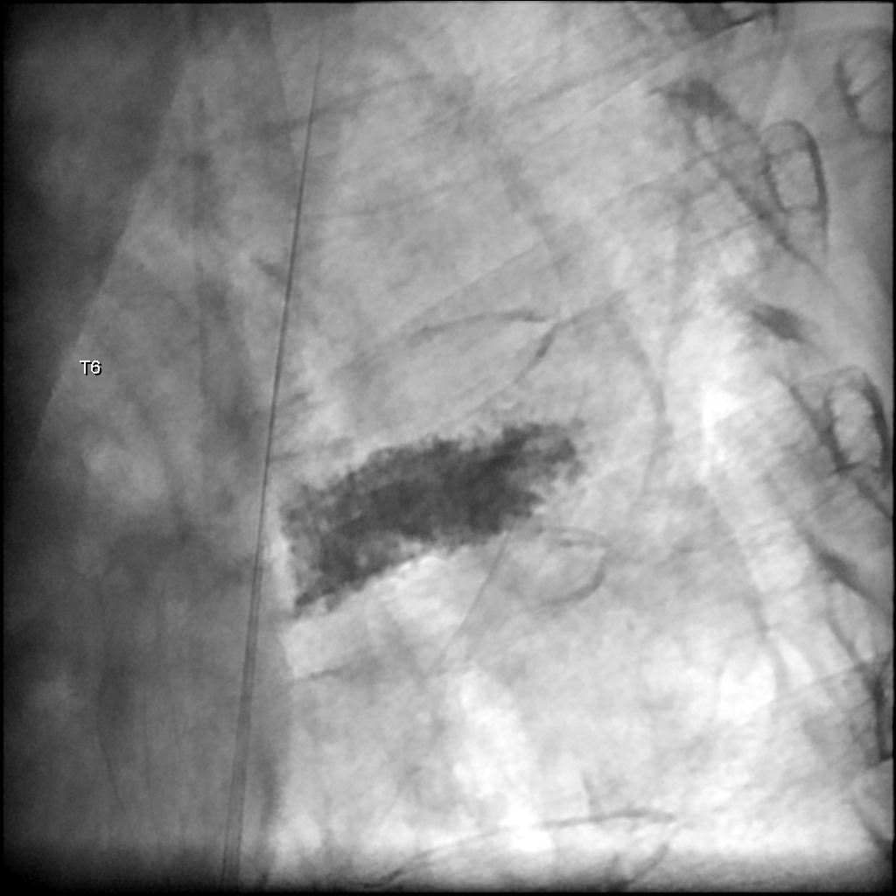

[13 of 24 positions shown; findings below may reference images not displayed]

MEDICATIONS:
As antibiotic prophylaxis, Ancef 2 g IV was ordered pre-procedure
and administered intravenously within 1 hour of incision.

ANESTHESIA/SEDATION:
Moderate (conscious) sedation was employed during this procedure. A
total of Versed 1 mg and Fentanyl 37.5 mcg was administered
intravenously.

Moderate Sedation Time: 67 minutes. The patient's level of
consciousness and vital signs were monitored continuously by
radiology nursing throughout the procedure under my direct
supervision.

FLUOROSCOPY TIME:  Fluoroscopy Time: 19 minutes 2 seconds DAP (989
mGy).

COMPLICATIONS:
None immediate.

PROCEDURE:
Following a full explanation of the procedure along with the
potential associated complications, an informed witnessed consent
was obtained.

The patient was placed in prone position on the angiography table.
The thoracic spine region was prepped and draped in a sterile
fashion.

The T1 vertebral body was well seen on 80 fluoroscopy. However, the
shoulders were superimposed on the T1 vertebral body on lateral
fluoroscopy. Multiple attempts to reposition the patient's arms in a
way to provide a good lateral fluoroscopic window proved
unsuccessful. Therefore, kyphoplasty of the T1 level was aborted.

Under fluoroscopy, the T6 vertebral body was delineated and the skin
area was marked. The skin was infiltrated with a 1% Lidocaine
approximately 2.2 cm lateral to the spinous process projection on
the right. Using a 22-gauge spinal needle, the soft issue and the
peripedicular space and periosteum were infiltrated with Bupivacaine
0.5%. A skin incision was made at the access site.

Subsequently, an 11-gauge Kyphon trocar was inserted under
fluoroscopic guidance until contact with the pedicle was obtained.
The trocar was inserted under light Orchel Euphemia into the pedicle
until the posterior boundaries of the vertebral body was reached.
The diamond mandrill was removed.

The skin was infiltrated with a 1% Lidocaine approximately 3 cm
lateral to the spinous process projection on the left. Using a
22-gauge spinal needle, the soft issue and the peripedicular space
and periosteum were infiltrated with Bupivacaine 0.5%. A skin
incision was made at the access site. Subsequently, an 11-gauge
Kyphon trocar was inserted under fluoroscopic guidance until contact
with the pedicle was obtained. The trocar was inserted under light
Orchel Euphemia into the pedicle until the posterior boundaries of
the vertebral body was reached. The diamond mandrill was removed and
one core biopsy wasobtained.

An inflatable Kyphon balloon was advanced through the left pedicular
access into the mid-aspect of the vertebral body. The balloon was
inflated to create a void to serve as a repository for the bone
cement. The balloon was deflated and through both cannulas, under
continuous fluoroscopy guidance in the AP and lateral views, the
vertebral body was filled with previously mixed
polymethyl-methacrylate (PMMA) added to barium for opacification.
Both cannulas were later removed.

The access sites were cleaned and covered with a sterile bandage.
IMPRESSION: 1. Successful fluoroscopy-guided bilateral transpedicular approach
for T6 vertebral body core bone biopsy and kyphoplasty for treatment
of osteoporotic fragility fracture. Bone sample obtained was sent
for tissue exam.
2. T1 vertebral body kyphoplasty was aborted due to poor
fluoroscopic visualization, as explained above.

## 2022-09-16 DIAGNOSIS — H269 Unspecified cataract: Secondary | ICD-10-CM | POA: Diagnosis not present

## 2022-09-16 DIAGNOSIS — H2512 Age-related nuclear cataract, left eye: Secondary | ICD-10-CM | POA: Diagnosis not present

## 2022-09-16 DIAGNOSIS — H2511 Age-related nuclear cataract, right eye: Secondary | ICD-10-CM | POA: Diagnosis not present

## 2022-09-23 DIAGNOSIS — H269 Unspecified cataract: Secondary | ICD-10-CM | POA: Diagnosis not present

## 2022-09-23 DIAGNOSIS — H2512 Age-related nuclear cataract, left eye: Secondary | ICD-10-CM | POA: Diagnosis not present

## 2022-09-23 DIAGNOSIS — H2511 Age-related nuclear cataract, right eye: Secondary | ICD-10-CM | POA: Diagnosis not present

## 2022-11-25 ENCOUNTER — Other Ambulatory Visit: Payer: Self-pay | Admitting: Internal Medicine

## 2022-12-17 DIAGNOSIS — H26492 Other secondary cataract, left eye: Secondary | ICD-10-CM | POA: Diagnosis not present

## 2022-12-24 DIAGNOSIS — H26491 Other secondary cataract, right eye: Secondary | ICD-10-CM | POA: Diagnosis not present

## 2023-02-01 DIAGNOSIS — I493 Ventricular premature depolarization: Secondary | ICD-10-CM | POA: Diagnosis not present

## 2023-02-01 DIAGNOSIS — I10 Essential (primary) hypertension: Secondary | ICD-10-CM | POA: Diagnosis not present

## 2023-02-04 ENCOUNTER — Ambulatory Visit: Payer: Medicare Other | Admitting: Orthopaedic Surgery

## 2023-02-23 ENCOUNTER — Ambulatory Visit: Payer: Medicare Other | Admitting: Orthopaedic Surgery

## 2023-03-23 DIAGNOSIS — Z961 Presence of intraocular lens: Secondary | ICD-10-CM | POA: Diagnosis not present

## 2023-03-23 DIAGNOSIS — H04123 Dry eye syndrome of bilateral lacrimal glands: Secondary | ICD-10-CM | POA: Diagnosis not present

## 2023-03-23 DIAGNOSIS — H35371 Puckering of macula, right eye: Secondary | ICD-10-CM | POA: Diagnosis not present

## 2023-03-23 DIAGNOSIS — H353131 Nonexudative age-related macular degeneration, bilateral, early dry stage: Secondary | ICD-10-CM | POA: Diagnosis not present

## 2023-04-15 ENCOUNTER — Ambulatory Visit: Payer: Medicare Other | Admitting: Orthopaedic Surgery

## 2023-04-15 ENCOUNTER — Other Ambulatory Visit (INDEPENDENT_AMBULATORY_CARE_PROVIDER_SITE_OTHER): Payer: Medicare Other

## 2023-04-15 ENCOUNTER — Encounter: Payer: Self-pay | Admitting: Orthopaedic Surgery

## 2023-04-15 DIAGNOSIS — G8929 Other chronic pain: Secondary | ICD-10-CM | POA: Diagnosis not present

## 2023-04-15 DIAGNOSIS — Z8781 Personal history of (healed) traumatic fracture: Secondary | ICD-10-CM

## 2023-04-15 DIAGNOSIS — Z9889 Other specified postprocedural states: Secondary | ICD-10-CM

## 2023-04-15 DIAGNOSIS — M545 Low back pain, unspecified: Secondary | ICD-10-CM

## 2023-04-15 NOTE — Progress Notes (Signed)
The patient is an 88 year old female who we fixed a left hip intertrochanteric fracture back in March 2023.  She does ambulate using a cane.  In January 2023 she had some type of kyphoplasty of the thoracic spine.  She continues to deal with off and on left sided back pain that seems to be more in the thoracic spine area.  I did watch her walk and she does walk with a kyphosis and does use a cane.  She says her hip does not hurt at all.  She denies any numbness and tingling.  She does takes Advil for pain.  I can easily put her left hip through internal extra rotation without any difficulty at all.  She does have more of a kyphosis when she stands up and leans over forward.  She has limited flexion extension of her spine.  An AP pelvis and lateral left hip shows a healed intertrochanteric fracture with intact hardware.  The hip joint spaces well-maintained.  2 views of the lumbar spine show osteopenic bone with subacute compression fractures of T10 and T11 with significant loss of vertebral body height.  I talked her about potentially outpatient physical therapy but she says she cannot really get the therapy.  She said she just needed really reassurance that this is not coming get severely worse for her because she does have a lot of anxiety as it is relating to this issues of previous compression fracture.  All question concerns were addressed and answered and I will try to give her reassurance is much as I could.  Follow-up is as needed.

## 2023-04-29 DIAGNOSIS — H18453 Nodular corneal degeneration, bilateral: Secondary | ICD-10-CM | POA: Diagnosis not present

## 2023-04-29 DIAGNOSIS — H35371 Puckering of macula, right eye: Secondary | ICD-10-CM | POA: Diagnosis not present

## 2023-04-29 DIAGNOSIS — H04123 Dry eye syndrome of bilateral lacrimal glands: Secondary | ICD-10-CM | POA: Diagnosis not present

## 2023-04-29 DIAGNOSIS — H353131 Nonexudative age-related macular degeneration, bilateral, early dry stage: Secondary | ICD-10-CM | POA: Diagnosis not present

## 2023-10-05 ENCOUNTER — Emergency Department (HOSPITAL_COMMUNITY)

## 2023-10-05 ENCOUNTER — Encounter (HOSPITAL_COMMUNITY): Payer: Self-pay | Admitting: Internal Medicine

## 2023-10-05 ENCOUNTER — Other Ambulatory Visit: Payer: Self-pay

## 2023-10-05 ENCOUNTER — Inpatient Hospital Stay (HOSPITAL_COMMUNITY)
Admission: EM | Admit: 2023-10-05 | Discharge: 2023-10-13 | DRG: 535 | Disposition: A | Discharge status: Skilled Nursing Facility | Attending: Internal Medicine | Admitting: Internal Medicine

## 2023-10-05 DIAGNOSIS — I10 Essential (primary) hypertension: Secondary | ICD-10-CM | POA: Diagnosis not present

## 2023-10-05 DIAGNOSIS — R0989 Other specified symptoms and signs involving the circulatory and respiratory systems: Secondary | ICD-10-CM | POA: Diagnosis not present

## 2023-10-05 DIAGNOSIS — N3000 Acute cystitis without hematuria: Principal | ICD-10-CM | POA: Diagnosis present

## 2023-10-05 DIAGNOSIS — Z8249 Family history of ischemic heart disease and other diseases of the circulatory system: Secondary | ICD-10-CM | POA: Diagnosis not present

## 2023-10-05 DIAGNOSIS — K5730 Diverticulosis of large intestine without perforation or abscess without bleeding: Secondary | ICD-10-CM | POA: Diagnosis present

## 2023-10-05 DIAGNOSIS — Z7982 Long term (current) use of aspirin: Secondary | ICD-10-CM

## 2023-10-05 DIAGNOSIS — I6523 Occlusion and stenosis of bilateral carotid arteries: Secondary | ICD-10-CM | POA: Diagnosis not present

## 2023-10-05 DIAGNOSIS — Z87891 Personal history of nicotine dependence: Secondary | ICD-10-CM | POA: Diagnosis not present

## 2023-10-05 DIAGNOSIS — R2681 Unsteadiness on feet: Secondary | ICD-10-CM | POA: Diagnosis not present

## 2023-10-05 DIAGNOSIS — S22059A Unspecified fracture of T5-T6 vertebra, initial encounter for closed fracture: Secondary | ICD-10-CM | POA: Diagnosis present

## 2023-10-05 DIAGNOSIS — S32599D Other specified fracture of unspecified pubis, subsequent encounter for fracture with routine healing: Secondary | ICD-10-CM | POA: Diagnosis not present

## 2023-10-05 DIAGNOSIS — K4020 Bilateral inguinal hernia, without obstruction or gangrene, not specified as recurrent: Secondary | ICD-10-CM | POA: Diagnosis present

## 2023-10-05 DIAGNOSIS — E663 Overweight: Secondary | ICD-10-CM | POA: Diagnosis present

## 2023-10-05 DIAGNOSIS — R609 Edema, unspecified: Secondary | ICD-10-CM | POA: Diagnosis not present

## 2023-10-05 DIAGNOSIS — S0990XA Unspecified injury of head, initial encounter: Secondary | ICD-10-CM | POA: Diagnosis not present

## 2023-10-05 DIAGNOSIS — S32591D Other specified fracture of right pubis, subsequent encounter for fracture with routine healing: Secondary | ICD-10-CM | POA: Diagnosis not present

## 2023-10-05 DIAGNOSIS — J189 Pneumonia, unspecified organism: Secondary | ICD-10-CM | POA: Diagnosis present

## 2023-10-05 DIAGNOSIS — M1612 Unilateral primary osteoarthritis, left hip: Secondary | ICD-10-CM | POA: Diagnosis not present

## 2023-10-05 DIAGNOSIS — R7309 Other abnormal glucose: Secondary | ICD-10-CM | POA: Diagnosis not present

## 2023-10-05 DIAGNOSIS — E785 Hyperlipidemia, unspecified: Secondary | ICD-10-CM | POA: Diagnosis present

## 2023-10-05 DIAGNOSIS — J9601 Acute respiratory failure with hypoxia: Secondary | ICD-10-CM | POA: Diagnosis not present

## 2023-10-05 DIAGNOSIS — Z79890 Hormone replacement therapy (postmenopausal): Secondary | ICD-10-CM | POA: Diagnosis not present

## 2023-10-05 DIAGNOSIS — Z803 Family history of malignant neoplasm of breast: Secondary | ICD-10-CM

## 2023-10-05 DIAGNOSIS — W010XXA Fall on same level from slipping, tripping and stumbling without subsequent striking against object, initial encounter: Secondary | ICD-10-CM | POA: Diagnosis present

## 2023-10-05 DIAGNOSIS — S199XXA Unspecified injury of neck, initial encounter: Secondary | ICD-10-CM | POA: Diagnosis not present

## 2023-10-05 DIAGNOSIS — S32512A Fracture of superior rim of left pubis, initial encounter for closed fracture: Secondary | ICD-10-CM | POA: Diagnosis not present

## 2023-10-05 DIAGNOSIS — Z79899 Other long term (current) drug therapy: Secondary | ICD-10-CM

## 2023-10-05 DIAGNOSIS — M810 Age-related osteoporosis without current pathological fracture: Secondary | ICD-10-CM | POA: Diagnosis present

## 2023-10-05 DIAGNOSIS — E039 Hypothyroidism, unspecified: Secondary | ICD-10-CM | POA: Diagnosis present

## 2023-10-05 DIAGNOSIS — R296 Repeated falls: Secondary | ICD-10-CM | POA: Diagnosis not present

## 2023-10-05 DIAGNOSIS — S32599A Other specified fracture of unspecified pubis, initial encounter for closed fracture: Secondary | ICD-10-CM

## 2023-10-05 DIAGNOSIS — Z9861 Coronary angioplasty status: Secondary | ICD-10-CM

## 2023-10-05 DIAGNOSIS — R270 Ataxia, unspecified: Secondary | ICD-10-CM | POA: Diagnosis not present

## 2023-10-05 DIAGNOSIS — E7800 Pure hypercholesterolemia, unspecified: Secondary | ICD-10-CM | POA: Diagnosis present

## 2023-10-05 DIAGNOSIS — R059 Cough, unspecified: Secondary | ICD-10-CM | POA: Diagnosis not present

## 2023-10-05 DIAGNOSIS — K5900 Constipation, unspecified: Secondary | ICD-10-CM | POA: Diagnosis not present

## 2023-10-05 DIAGNOSIS — R030 Elevated blood-pressure reading, without diagnosis of hypertension: Secondary | ICD-10-CM | POA: Diagnosis not present

## 2023-10-05 DIAGNOSIS — S72009D Fracture of unspecified part of neck of unspecified femur, subsequent encounter for closed fracture with routine healing: Secondary | ICD-10-CM | POA: Diagnosis not present

## 2023-10-05 DIAGNOSIS — S32511A Fracture of superior rim of right pubis, initial encounter for closed fracture: Secondary | ICD-10-CM | POA: Diagnosis not present

## 2023-10-05 DIAGNOSIS — D649 Anemia, unspecified: Secondary | ICD-10-CM | POA: Diagnosis not present

## 2023-10-05 DIAGNOSIS — A419 Sepsis, unspecified organism: Secondary | ICD-10-CM | POA: Diagnosis not present

## 2023-10-05 DIAGNOSIS — S32592A Other specified fracture of left pubis, initial encounter for closed fracture: Secondary | ICD-10-CM | POA: Diagnosis not present

## 2023-10-05 DIAGNOSIS — E871 Hypo-osmolality and hyponatremia: Secondary | ICD-10-CM | POA: Diagnosis not present

## 2023-10-05 DIAGNOSIS — Z8262 Family history of osteoporosis: Secondary | ICD-10-CM | POA: Diagnosis not present

## 2023-10-05 DIAGNOSIS — Z888 Allergy status to other drugs, medicaments and biological substances status: Secondary | ICD-10-CM

## 2023-10-05 DIAGNOSIS — M47812 Spondylosis without myelopathy or radiculopathy, cervical region: Secondary | ICD-10-CM | POA: Diagnosis not present

## 2023-10-05 DIAGNOSIS — R0902 Hypoxemia: Secondary | ICD-10-CM | POA: Diagnosis present

## 2023-10-05 DIAGNOSIS — Z1152 Encounter for screening for COVID-19: Secondary | ICD-10-CM

## 2023-10-05 DIAGNOSIS — M8008XD Age-related osteoporosis with current pathological fracture, vertebra(e), subsequent encounter for fracture with routine healing: Secondary | ICD-10-CM | POA: Diagnosis not present

## 2023-10-05 DIAGNOSIS — M160 Bilateral primary osteoarthritis of hip: Secondary | ICD-10-CM | POA: Diagnosis present

## 2023-10-05 DIAGNOSIS — I2510 Atherosclerotic heart disease of native coronary artery without angina pectoris: Secondary | ICD-10-CM | POA: Diagnosis not present

## 2023-10-05 DIAGNOSIS — R278 Other lack of coordination: Secondary | ICD-10-CM | POA: Diagnosis not present

## 2023-10-05 DIAGNOSIS — R5383 Other fatigue: Secondary | ICD-10-CM | POA: Diagnosis not present

## 2023-10-05 DIAGNOSIS — S22000A Wedge compression fracture of unspecified thoracic vertebra, initial encounter for closed fracture: Secondary | ICD-10-CM | POA: Diagnosis present

## 2023-10-05 DIAGNOSIS — S32591A Other specified fracture of right pubis, initial encounter for closed fracture: Secondary | ICD-10-CM | POA: Diagnosis present

## 2023-10-05 DIAGNOSIS — Y92009 Unspecified place in unspecified non-institutional (private) residence as the place of occurrence of the external cause: Secondary | ICD-10-CM

## 2023-10-05 DIAGNOSIS — W19XXXA Unspecified fall, initial encounter: Secondary | ICD-10-CM

## 2023-10-05 DIAGNOSIS — H9190 Unspecified hearing loss, unspecified ear: Secondary | ICD-10-CM | POA: Diagnosis present

## 2023-10-05 DIAGNOSIS — M6281 Muscle weakness (generalized): Secondary | ICD-10-CM | POA: Diagnosis not present

## 2023-10-05 DIAGNOSIS — M899 Disorder of bone, unspecified: Secondary | ICD-10-CM | POA: Diagnosis not present

## 2023-10-05 DIAGNOSIS — Z043 Encounter for examination and observation following other accident: Secondary | ICD-10-CM | POA: Diagnosis not present

## 2023-10-05 DIAGNOSIS — Z6826 Body mass index (BMI) 26.0-26.9, adult: Secondary | ICD-10-CM

## 2023-10-05 DIAGNOSIS — Z7401 Bed confinement status: Secondary | ICD-10-CM | POA: Diagnosis not present

## 2023-10-05 LAB — RESP PANEL BY RT-PCR (RSV, FLU A&B, COVID)  RVPGX2
Influenza A by PCR: NEGATIVE
Influenza B by PCR: NEGATIVE
Resp Syncytial Virus by PCR: NEGATIVE
SARS Coronavirus 2 by RT PCR: NEGATIVE

## 2023-10-05 LAB — URINALYSIS, W/ REFLEX TO CULTURE (INFECTION SUSPECTED)
Bilirubin Urine: NEGATIVE
Glucose, UA: NEGATIVE mg/dL
Hgb urine dipstick: NEGATIVE
Ketones, ur: 20 mg/dL — AB
Nitrite: POSITIVE — AB
Protein, ur: 100 mg/dL — AB
Specific Gravity, Urine: 1.034 — ABNORMAL HIGH (ref 1.005–1.030)
pH: 6 (ref 5.0–8.0)

## 2023-10-05 LAB — CBC WITH DIFFERENTIAL/PLATELET
Abs Immature Granulocytes: 0.05 10*3/uL (ref 0.00–0.07)
Basophils Absolute: 0 10*3/uL (ref 0.0–0.1)
Basophils Relative: 0 %
Eosinophils Absolute: 0 10*3/uL (ref 0.0–0.5)
Eosinophils Relative: 0 %
HCT: 35.5 % — ABNORMAL LOW (ref 36.0–46.0)
Hemoglobin: 11.3 g/dL — ABNORMAL LOW (ref 12.0–15.0)
Immature Granulocytes: 1 %
Lymphocytes Relative: 5 %
Lymphs Abs: 0.4 10*3/uL — ABNORMAL LOW (ref 0.7–4.0)
MCH: 34.6 pg — ABNORMAL HIGH (ref 26.0–34.0)
MCHC: 31.8 g/dL (ref 30.0–36.0)
MCV: 108.6 fL — ABNORMAL HIGH (ref 80.0–100.0)
Monocytes Absolute: 0.5 10*3/uL (ref 0.1–1.0)
Monocytes Relative: 6 %
Neutro Abs: 6.9 10*3/uL (ref 1.7–7.7)
Neutrophils Relative %: 88 %
Platelets: 212 10*3/uL (ref 150–400)
RBC: 3.27 MIL/uL — ABNORMAL LOW (ref 3.87–5.11)
RDW: 14.4 % (ref 11.5–15.5)
WBC: 7.8 10*3/uL (ref 4.0–10.5)
nRBC: 0 % (ref 0.0–0.2)

## 2023-10-05 LAB — APTT: aPTT: 28 s (ref 24–36)

## 2023-10-05 LAB — COMPREHENSIVE METABOLIC PANEL WITH GFR
ALT: 37 U/L (ref 0–44)
AST: 31 U/L (ref 15–41)
Albumin: 3.6 g/dL (ref 3.5–5.0)
Alkaline Phosphatase: 89 U/L (ref 38–126)
Anion gap: 9 (ref 5–15)
BUN: 22 mg/dL (ref 8–23)
CO2: 23 mmol/L (ref 22–32)
Calcium: 9 mg/dL (ref 8.9–10.3)
Chloride: 102 mmol/L (ref 98–111)
Creatinine, Ser: 0.44 mg/dL (ref 0.44–1.00)
GFR, Estimated: >60 mL/min (ref >60)
Glucose, Bld: 110 mg/dL — ABNORMAL HIGH (ref 70–99)
Potassium: 4.1 mmol/L (ref 3.5–5.1)
Sodium: 134 mmol/L — ABNORMAL LOW (ref 135–145)
Total Bilirubin: 0.4 mg/dL (ref 0.0–1.2)
Total Protein: 6.5 g/dL (ref 6.5–8.1)

## 2023-10-05 LAB — BRAIN NATRIURETIC PEPTIDE: B Natriuretic Peptide: 177.1 pg/mL — ABNORMAL HIGH (ref 0.0–100.0)

## 2023-10-05 LAB — I-STAT CG4 LACTIC ACID, ED: Lactic Acid, Venous: 1.1 mmol/L (ref 0.5–1.9)

## 2023-10-05 LAB — PROTIME-INR
INR: 1.2 (ref 0.8–1.2)
Prothrombin Time: 15.5 s — ABNORMAL HIGH (ref 11.4–15.2)

## 2023-10-05 MED ORDER — ENOXAPARIN SODIUM 40 MG/0.4ML IJ SOSY
40.0000 mg | PREFILLED_SYRINGE | INTRAMUSCULAR | Status: DC
  Administered 2023-10-07 – 2023-10-12 (×6): 40 mg via SUBCUTANEOUS
  Filled 2023-10-05 (×7): qty 0.4

## 2023-10-05 MED ORDER — HYDROCODONE-ACETAMINOPHEN 5-325 MG PO TABS
1.0000 | ORAL_TABLET | ORAL | Status: DC | PRN
  Administered 2023-10-05 – 2023-10-07 (×2): 1 via ORAL
  Filled 2023-10-05 (×2): qty 1

## 2023-10-05 MED ORDER — ONDANSETRON HCL 4 MG PO TABS
4.0000 mg | ORAL_TABLET | Freq: Four times a day (QID) | ORAL | Status: DC | PRN
Start: 2023-10-05 — End: 2023-10-13

## 2023-10-05 MED ORDER — IOHEXOL 300 MG/ML  SOLN
100.0000 mL | Freq: Once | INTRAMUSCULAR | Status: AC | PRN
  Administered 2023-10-05: 100 mL via INTRAVENOUS

## 2023-10-05 MED ORDER — SENNOSIDES-DOCUSATE SODIUM 8.6-50 MG PO TABS: 1.0000 | ORAL_TABLET | Freq: Every evening | ORAL | Status: DC | PRN

## 2023-10-05 MED ORDER — SODIUM CHLORIDE 0.9 % IV SOLN
500.0000 mg | Freq: Once | INTRAVENOUS | Status: AC
  Administered 2023-10-05: 500 mg via INTRAVENOUS
  Filled 2023-10-05: qty 5

## 2023-10-05 MED ORDER — ONDANSETRON HCL 4 MG/2ML IJ SOLN: 4.0000 mg | Freq: Four times a day (QID) | INTRAMUSCULAR | Status: DC | PRN

## 2023-10-05 MED ORDER — IOHEXOL 350 MG/ML SOLN: 100.0000 mL | Freq: Once | INTRAVENOUS | Status: DC | PRN

## 2023-10-05 MED ORDER — ACETAMINOPHEN 650 MG RE SUPP: 650.0000 mg | Freq: Four times a day (QID) | RECTAL | Status: DC | PRN

## 2023-10-05 MED ORDER — LOSARTAN POTASSIUM 50 MG PO TABS: 50.0000 mg | ORAL_TABLET | Freq: Every day | ORAL | Status: DC

## 2023-10-05 MED ORDER — BISACODYL 5 MG PO TBEC: 5.0000 mg | DELAYED_RELEASE_TABLET | Freq: Every day | ORAL | Status: DC | PRN

## 2023-10-05 MED ORDER — SODIUM CHLORIDE 0.9 % IV SOLN
2.0000 g | Freq: Once | INTRAVENOUS | Status: AC
  Administered 2023-10-05: 2 g via INTRAVENOUS
  Filled 2023-10-05: qty 20

## 2023-10-05 MED ORDER — ALBUTEROL SULFATE (2.5 MG/3ML) 0.083% IN NEBU
2.5000 mg | INHALATION_SOLUTION | Freq: Four times a day (QID) | RESPIRATORY_TRACT | Status: DC | PRN
  Administered 2023-10-13: 2.5 mg via RESPIRATORY_TRACT
  Filled 2023-10-05: qty 3

## 2023-10-05 MED ORDER — LACTATED RINGERS IV SOLN: INTRAVENOUS | Status: DC

## 2023-10-05 MED ORDER — ACETAMINOPHEN 325 MG PO TABS: 650.0000 mg | ORAL_TABLET | Freq: Four times a day (QID) | ORAL | Status: DC | PRN

## 2023-10-05 MED ORDER — AMLODIPINE BESYLATE 5 MG PO TABS: 5.0000 mg | ORAL_TABLET | Freq: Every day | ORAL | Status: DC

## 2023-10-05 MED ORDER — LEVOTHYROXINE SODIUM 50 MCG PO TABS
50.0000 ug | ORAL_TABLET | Freq: Every day | ORAL | Status: DC
  Administered 2023-10-06 – 2023-10-13 (×7): 50 ug via ORAL
  Filled 2023-10-05 (×7): qty 1

## 2023-10-05 MED ORDER — ASPIRIN 325 MG PO TBEC
325.0000 mg | DELAYED_RELEASE_TABLET | Freq: Every day | ORAL | Status: DC
  Administered 2023-10-06 – 2023-10-13 (×8): 325 mg via ORAL
  Filled 2023-10-05 (×8): qty 1

## 2023-10-05 MED ORDER — SODIUM CHLORIDE 0.9 % IV SOLN
1.0000 g | INTRAVENOUS | Status: DC
  Administered 2023-10-06 – 2023-10-07 (×2): 1 g via INTRAVENOUS
  Filled 2023-10-05 (×2): qty 10

## 2023-10-05 MED ORDER — ATORVASTATIN CALCIUM 40 MG PO TABS
80.0000 mg | ORAL_TABLET | Freq: Every day | ORAL | Status: DC
  Administered 2023-10-06 – 2023-10-13 (×8): 80 mg via ORAL
  Filled 2023-10-05 (×8): qty 2

## 2023-10-05 NOTE — Sepsis Progress Note (Signed)
 Code Sepsis protocol being monitored by [facility].

## 2023-10-05 NOTE — Hospital Course (Signed)
 HPI: Natasha Harper is a 89 y.o. female with medical history significant for CAD s/p PCI, HTN, HLD, hypothyroidism, Hx of left hip fracture s/p ORIF 09/2021 who presented to the ED for evaluation of left hip pain after fall at home.   Patient states that she was getting up from her recliner earlier today when she slipped and fell onto her buttocks.  She had subsequent left hip/groin pain and was unable to stand or ambulate on her home.  She denied any head injury or loss of consciousness.  She denied associated lightheadedness, dizziness, chest pain, dyspnea.   Husband states that her ambulatory status has been diminished over the last 2 years.   ED Course  Labs/Imaging on admission: I have personally reviewed following labs and imaging studies.   Initial vitals showed BP 175/86, pulse 102, RR 19, temp 97.8 F, SpO2 94% on 6 L O2 via [place].  Per EDP SpO2 was in mid 80s on room air.   Labs showed WBC 7.8, hemoglobin 11.3, platelets 212,000, sodium 134, potassium 4.1, bicarb 23, BUN 22, creatinine 0.44, serum glucose 110, LFTs within normal limits, BNP 177.1, lactic acid 1.1.   UA with 20 ketones, 100 protein, positive nitrites, moderate leukocytes, 6-10 RBCs, 21-50 WBCs, rare bacteria.  Urine and blood cultures in process.  SARS-CoV-2, influenza, RSV PCR negative.   Left hip x-ray showed equivocal findings for nondisplaced fractures through the left superior ramus and left inferior ramus/ischium.   CT head without contrast negative for acute intracranial process.   CT cervical spine without contrast negative for acute cervical spine fracture.   CT chest/abdomen/pelvis with contrast was negative for acute intrathoracic pathology.  Progression of T5 compression fracture since prior CT noted.  Bilateral superior and inferior pubic rami fractures noted.  Sigmoid diverticulosis seen.  No bowel obstruction.  Normal-appearing appendix.  Bilateral inguinal hernias containing short segment of small bowel,  left greater than right.  No obstruction.  Multiple bilateral breast nodules also noted.   Patient was given IV ceftriaxone and azithromycin.  The hospitalist service was consulted to admit.  Significant Events: Admitted 10/05/2023 fall at home, bilateral pubic rami fractures   Significant Labs: WBC 7.8, HgB 11.3, plt 212 Na 134, K 4.1, CO2 of 23, BUN 22, Scr 0.44. glu 110 UA spg 1.034, +nitrite, +LE, rare bacteria BNP 177  Significant Imaging Studies: Left hip XR Equivocal findings for nondisplaced fractures through the left superior ramus and left inferior ramus/ischium. CT may be useful for further evaluation. 2. Extensive subcutaneous edema over the lateral aspect of the left hip, compatible with contusion after fall.  3. Stable ORIF of a prior healed intertrochanteric left hip fracture. 4. Stable bilateral hip osteoarthritis. CXR Low lung volumes. No acute airspace disease.  Left femur XR Equivocal findings for nondisplaced left superior and inferior pubic rami fractures. CT recommended. Please see left hip exam for complete characterization. 2. Prior ORIF of a healed intertrochanteric left hip fracture. Otherwise unremarkable left femur.  3. Soft tissue swelling overlying the lateral aspect left hip, consistent with contusion. CT head No acute intracranial process. 2. Stable atrophy and chronic small vessel ischemic changes. CT c-spine No acute cervical spine fracture.  CT chest/abd/pelvis No acute intrathoracic pathology. 2. Progression of T5 compression fracture since the prior CT. Correlation with clinical exam and point tenderness recommended. 3. Bilateral superior and inferior pubic rami fractures. 4. Sigmoid diverticulosis. No bowel obstruction. Normal appendix. 5. Bilateral inguinal hernias containing short segments of small bowel, left greater  than right. No obstruction. 6. Multiple bilateral breast nodules.  Antibiotic Therapy: Anti-infectives (From admission, onward)    Start      Dose/Rate Route Frequency Ordered Stop   10/06/23 1600  cefTRIAXone (ROCEPHIN) 1 g in sodium chloride 0.9 % 100 mL IVPB        1 g 200 mL/hr over 30 Minutes Intravenous Every 24 hours 10/05/23 2048     10/05/23 1615  cefTRIAXone (ROCEPHIN) 2 g in sodium chloride 0.9 % 100 mL IVPB        2 g 200 mL/hr over 30 Minutes Intravenous Once 10/05/23 1609 10/05/23 1724   10/05/23 1615  azithromycin (ZITHROMAX) 500 mg in sodium chloride 0.9 % 250 mL IVPB        500 mg 250 mL/hr over 60 Minutes Intravenous  Once 10/05/23 1609 10/05/23 1921       Procedures:   Consultants:

## 2023-10-05 NOTE — H&P (Signed)
 History and Physical    Natasha Harper FAO:[id number] DOB: [birth year] DOA: 10/05/2023  PCP: [name], MD  Patient coming from: Home  I have personally briefly reviewed patient's old medical records in [facility]  Chief Complaint: Fall at home, left hip pain  HPI: Natasha Harper is a 89 y.o. female with medical history significant for CAD s/p PCI, HTN, HLD, hypothyroidism, Hx of left hip fracture s/p ORIF 09/2021 who presented to the ED for evaluation of left hip pain after fall at home.  Patient states that she was getting up from her recliner earlier today when she slipped and fell onto her buttocks.  She had subsequent left hip/groin pain and was unable to stand or ambulate on her home.  She denied any head injury or loss of consciousness.  She denied associated lightheadedness, dizziness, chest pain, dyspnea.  Husband states that her ambulatory status has been diminished over the last 2 years.  ED Course  Labs/Imaging on admission: I have personally reviewed following labs and imaging studies.  Initial vitals showed BP 175/86, pulse 102, RR 19, temp 97.8 F, SpO2 94% on 6 L O2 via [place].  Per EDP SpO2 was in mid 80s on room air.  Labs showed WBC 7.8, hemoglobin 11.3, platelets 212,000, sodium 134, potassium 4.1, bicarb 23, BUN 22, creatinine 0.44, serum glucose 110, LFTs within normal limits, BNP 177.1, lactic acid 1.1.  UA with 20 ketones, 100 protein, positive nitrites, moderate leukocytes, 6-10 RBCs, 21-50 WBCs, rare bacteria.  Urine and blood cultures in process.  SARS-CoV-2, influenza, RSV PCR negative.  Left hip x-ray showed equivocal findings for nondisplaced fractures through the left superior ramus and left inferior ramus/ischium.  CT head without contrast negative for acute intracranial process.  CT cervical spine without contrast negative for acute cervical spine fracture.  CT chest/abdomen/pelvis with contrast was negative for acute intrathoracic pathology.   Progression of T5 compression fracture since prior CT noted.  Bilateral superior and inferior pubic rami fractures noted.  Sigmoid diverticulosis seen.  No bowel obstruction.  Normal-appearing appendix.  Bilateral inguinal hernias containing short segment of small bowel, left greater than right.  No obstruction.  Multiple bilateral breast nodules also noted.  Patient was given IV ceftriaxone and azithromycin.  The hospitalist service was consulted to admit.  Review of Systems: All systems reviewed and are negative except as documented in history of present illness above.   Past Medical History:  Diagnosis Date   CAD (coronary artery disease)    PCI to RCA and diagonal in remote past, residual 70% LAD  /   nuclear, 2007, no ischemia   Carotid arterial disease (HCC)    Doppler, June, 2011, stable, 60-79% R. ICA, 40-59% LICA   Contact lens/glasses fitting    wears contacts or glasses   Dyslipidemia    Significant drop in LDL from Lipitor even though LDL remains high   Ejection fraction    EF 65%, nuclear, 2007   Fibrocystic breast    HTN (hypertension)     no med   Hypercholesterolemia    Hypothyroidism    Thyroid surgery in the past, thyroid nodules followed by Dr.Ellison   Lump or mass in breast 07/19/2007   Excised 01/12/13. B9 on pathQualifier: Diagnosis of  By: Fabian Sharp MD, Neta Mends     Osteoporosis    Prominent abdominal aortic pulsation    No abdominal aneurysm by ultrasound   Rectal fissure     Past Surgical History:  Procedure  Laterality Date   BREAST BIOPSY Right 01/12/2013   Procedure: Removal of right breast mass;  Surgeon: Currie Paris, MD;  Location: [facility];  Service: General;  Laterality: Right;   BREAST EXCISIONAL BIOPSY  9/14   scar tissue from the needle biopsy   CORONARY ANGIOPLASTY  1993   FEMUR IM NAIL Left 09/05/2021   Procedure: orif left hip;  Surgeon: Kathryne Hitch, MD;  Location: [facility];  Service: Orthopedics;  Laterality:  Left;  fracture table, carm, shower curtain,smith and nephew   IR KYPHO THORACIC WITH BONE BIOPSY  07/11/2021   RECTOPERITONEAL FISTULA CLOSURE     THYROID CYST EXCISION      Social History: Social History   Tobacco Use   Smoking status: Former    Current packs/day: 0.00    Types: Cigarettes    Quit date: 07/07/1958    Years since quitting: 65.2   Smokeless tobacco: Never  Substance Use Topics   Alcohol use: No   Drug use: No    Allergies  Allergen Reactions   Actonel [Risedronate Sodium]     bloating   Evista [Raloxifene]     Abdominal cramps   Fosamax [Alendronate Sodium]     Abdominal cramps    Family History  Problem Relation Age of Onset   Heart attack Father    Osteoporosis Mother    Breast cancer Daughter      Prior to Admission medications   Medication Sig Start Date End Date Taking? Authorizing Provider  acetaminophen (TYLENOL) 500 MG tablet Take 1,000 mg by mouth in the morning and at bedtime.    [provider]  amLODipine (NORVASC) 5 MG tablet Take 1 tablet (5 mg total) by mouth daily. 07/24/21   Calvert Cantor, MD  aspirin EC 325 MG EC tablet Take 1 tablet (325 mg total) by mouth daily with breakfast. 09/11/21   Kathryne Hitch, MD  atorvastatin (LIPITOR) 80 MG tablet TAKE 1 TABLET BY MOUTH DAILY 11/25/22   Worthy Rancher B, FNP  feeding supplement (ENSURE ENLIVE / ENSURE PLUS) LIQD Take 237 mLs by mouth 2 (two) times daily between meals. 09/10/21   Narda Bonds, MD  fish oil-omega-3 fatty acids 1000 MG capsule Take 1,000 mg by mouth daily.    [provider]  folic acid (FOLVITE) 1 MG tablet Take 1 tablet (1 mg total) by mouth daily. 04/16/21   Glade Lloyd, MD  HYDROcodone-acetaminophen (NORCO/VICODIN) 5-325 MG tablet Take 1-2 tablets by mouth every 4 (four) hours as needed for moderate pain (pain score 4-6). 09/10/21   Kathryne Hitch, MD  lidocaine (LIDODERM) 5 % Place 1 patch onto the skin daily. Remove & Discard patch within  12 hours or as directed by MD Patient taking differently: Place 1 patch onto the skin daily as needed (pain). Remove & Discard patch within 12 hours or as directed by MD 03/05/21   Panosh, Neta Mends, MD  losartan (COZAAR) 50 MG tablet Take 1 tablet (50 mg total) by mouth daily. 07/25/21   Calvert Cantor, MD  MAGNESIUM PO Take 1 tablet by mouth daily at 12 noon.    [provider]  Menaquinone-7 (VITAMIN K2 PO) Take 1 tablet by mouth daily. Vitamin D3    [provider]  moxifloxacin (VIGAMOX) 0.5 % ophthalmic solution Place 1 drop into the right eye 3 (three) times daily. 06/03/22   Glyn Ade, MD  MULTIPLE VITAMIN PO Take 1 tablet by mouth daily.  [provider]  polyethylene glycol (MIRALAX / GLYCOLAX) 17 g packet Take 17 g by mouth daily as needed for mild constipation. Can take up to twice a day for constipation. 07/24/21   Calvert Cantor, MD  SYNTHROID 50 MCG tablet TAKE 1 TABLET BY MOUTH DAILY 11/25/22   Eulis Foster, FNP    Physical Exam: Vitals:   10/05/23 1625 10/05/23 1700 10/05/23 2009 10/05/23 2015  BP: (!) 185/83 (!) 173/84 (!) 167/84   Pulse: 96 99 100   Resp: (!) 35 20 20   Temp:   98 F (36.7 C)   TempSrc:   Oral   SpO2: 92% 97% 92%   Weight:    64.2 kg  Height:    5\' 1"  (1.549 m)   Constitutional: Elderly woman resting in bed, NAD, calm Eyes: , lids and conjunctivae normal ENMT: Mucous membranes are moist. Posterior pharynx clear of any exudate or lesions.Normal dentition.  Hard. Neck: normal, supple, no masses. Respiratory: clear to auscultation bilaterally, no wheezing, no crackles. Normal respiratory effort while on 2 L O2 via [place]. No accessory muscle use.  Cardiovascular: Regular rate and rhythm, no murmurs / rubs / gallops. No extremity edema. 2+ pedal pulses. Abdomen: no tenderness, no masses palpated. Musculoskeletal: ROM diminished bilateral lower extremities due to bilateral pubic rami fractures Skin: no rashes, lesions,  ulcers. No induration Neurologic: Sensation intact. Strength 5/5 bilateral upper extremities, somewhat diminished bilateral lower extremities due to bilateral pubic rami fractures Psychiatric: Normal judgment and insight. Alert and oriented x 3. Normal mood.   EKG: Personally reviewed. Sinus rhythm, rate 97, LAE, no acute ischemic changes.  Assessment/Plan Principal Problem:   Bilateral pubic rami fractures, closed, initial encounter (HCC) Active Problems:   Acute cystitis without hematuria   Hypoxia   CAD (coronary artery disease)   Dyslipidemia   Hypothyroidism   Compression fracture of thoracic vertebra (HCC)   Primary hypertension   Fall at home, initial encounter   Natasha Harper is a 89 y.o. female with medical history significant for CAD s/p PCI, HTN, HLD, hypothyroidism, Hx of left hip fracture s/p ORIF 09/2021 who is admitted with bilateral pubic rami fractures after fall at home.  Assessment and Plan: Fall at home Bilateral superior and inferior pubic rami fractures Progression of known T5 compression fracture History of left hip ORIF 09/2021: Findings seen on CT after mechanical fall at home.  Limited mobility at baseline per spouse. -Continue pain control -PT/OT eval, fall precautions -Can notify Ortho care regarding follow-up tomorrow  Acute cystitis without hematuria: UA suggestive of UTI.  Potentially contributing to acute on chronic deconditioning leading to fall. -Follow urine culture -Continue IV ceftriaxone for now  Hypoxia: Patient with new hypoxia, SpO2 87-88% on room air, improved on 2-3 L via [place].  Most likely secondary to poor respiratory effort and atelectasis.  No focal consolidation, pleural effusion, pneumothorax seen on CT COVID, influenza, RSV negative. -Continue supplemental oxygen as needed, wean as able -Incentive spirometer, flutter valve, albuterol nebulizer as needed  CAD s/p remote PCI: Stable, denies chest pain.  Continue aspirin and  atorvastatin.  Hypertension: Continue amlodipine and losartan.  Hyperlipidemia: Continue atorvastatin.  Hypothyroidism: Continue Synthroid.   DVT prophylaxis: enoxaparin (LOVENOX) injection 40 mg Start: 10/06/23 2200 Code Status: Full code, confirmed with patient on admission Family Communication: Husband at bedside Disposition Plan: From home, dispo pending clinical progress Consults called: None Severity of Illness: The appropriate patient status for this patient is OBSERVATION. Observation status is judged  to be reasonable and necessary in order to provide the required intensity of service to ensure the patient's safety. The patient's presenting symptoms, physical exam findings, and initial radiographic and laboratory data in the context of their medical condition is felt to place them at decreased risk for further clinical deterioration. Furthermore, it is anticipated that the patient will be medically stable for discharge from the hospital within 2 midnights of admission.   Darreld Mclean MD [facility]  If 7PM-7AM, please contact night-coverage [email or web address]  10/05/2023, 11:27 PM

## 2023-10-05 NOTE — ED Triage Notes (Signed)
 Pt arrives via EMS from home co left hip pain after a fall today. Pt slipped and landed on bottom, hip is tender to touch  and pt is unable to bear weight on it. Hx hip replacement left hip

## 2023-10-05 NOTE — ED Provider Notes (Signed)
 [place] EMERGENCY DEPARTMENT AT [facility] Provider Note   CSN: [id number] Arrival date & time: 10/05/23  1513     History  Chief Complaint  Patient presents with   Hip Pain   Fall    Natasha Harper is a 89 y.o. female.  This is a 89 year old female who presents emergency room today due to pain in her left hip after a nonsyncopal fall from standing.  Patient reports that she was getting up from her recliner, lost her balance and landed on her left side.  No head strike, not on blood thinners.  Patient is endorsing pain in her left hip, which she had ORIF on in 2023 after a fall.        Home Medications Prior to Admission medications   Medication Sig Start Date End Date Taking? Authorizing Provider  acetaminophen (TYLENOL) 500 MG tablet Take 1,000 mg by mouth in the morning and at bedtime.    [provider]  amLODipine (NORVASC) 5 MG tablet Take 1 tablet (5 mg total) by mouth daily. 07/24/21   Calvert Cantor, MD  aspirin EC 325 MG EC tablet Take 1 tablet (325 mg total) by mouth daily with breakfast. 09/11/21   Kathryne Hitch, MD  atorvastatin (LIPITOR) 80 MG tablet TAKE 1 TABLET BY MOUTH DAILY 11/25/22   Worthy Rancher B, FNP  feeding supplement (ENSURE ENLIVE / ENSURE PLUS) LIQD Take 237 mLs by mouth 2 (two) times daily between meals. 09/10/21   Narda Bonds, MD  fish oil-omega-3 fatty acids 1000 MG capsule Take 1,000 mg by mouth daily.    [provider]  folic acid (FOLVITE) 1 MG tablet Take 1 tablet (1 mg total) by mouth daily. 04/16/21   Glade Lloyd, MD  HYDROcodone-acetaminophen (NORCO/VICODIN) 5-325 MG tablet Take 1-2 tablets by mouth every 4 (four) hours as needed for moderate pain (pain score 4-6). 09/10/21   Kathryne Hitch, MD  lidocaine (LIDODERM) 5 % Place 1 patch onto the skin daily. Remove & Discard patch within 12 hours or as directed by MD Patient taking differently: Place 1 patch onto the skin daily as needed  (pain). Remove & Discard patch within 12 hours or as directed by MD 03/05/21   Panosh, Neta Mends, MD  losartan (COZAAR) 50 MG tablet Take 1 tablet (50 mg total) by mouth daily. 07/25/21   Calvert Cantor, MD  MAGNESIUM PO Take 1 tablet by mouth daily at 12 noon.    [provider]  Menaquinone-7 (VITAMIN K2 PO) Take 1 tablet by mouth daily. Vitamin D3    [provider]  moxifloxacin (VIGAMOX) 0.5 % ophthalmic solution Place 1 drop into the right eye 3 (three) times daily. 06/03/22   Glyn Ade, MD  MULTIPLE VITAMIN PO Take 1 tablet by mouth daily.    [provider]  polyethylene glycol (MIRALAX / GLYCOLAX) 17 g packet Take 17 g by mouth daily as needed for mild constipation. Can take up to twice a day for constipation. 07/24/21   Calvert Cantor, MD  SYNTHROID 50 MCG tablet TAKE 1 TABLET BY MOUTH DAILY 11/25/22   Worthy Rancher B, FNP      Allergies    Actonel [risedronate sodium], Evista [raloxifene], and Fosamax [alendronate sodium]    Review of Systems   Review of Systems  Physical Exam Updated Vital Signs BP (!) 173/84   Pulse 99   Temp 97.8 F (36.6 C) (Oral)   Resp 20   LMP 07/07/1997  SpO2 97%  Physical Exam Vitals reviewed.  HENT:     Head: Normocephalic and atraumatic.  Cardiovascular:     Rate and Rhythm: Normal rate.     Pulses: Normal pulses.  Pulmonary:     Effort: Pulmonary effort is normal.  Abdominal:     General: Abdomen is flat.     Palpations: Abdomen is soft.  Musculoskeletal:        General: Normal range of motion.     Cervical back: Normal range of motion.  Skin:    General: Skin is warm.  Neurological:     General: No focal deficit present.     Mental Status: She is alert.     ED Results / Procedures / Treatments   Labs (all labs ordered are listed, but only abnormal results are displayed) Labs Reviewed  CBC WITH DIFFERENTIAL/PLATELET - Abnormal; Notable for the following components:      Result Value   RBC 3.27  (*)    Hemoglobin 11.3 (*)    HCT 35.5 (*)    MCV 108.6 (*)    MCH 34.6 (*)    Lymphs Abs 0.4 (*)    All other components within normal limits  COMPREHENSIVE METABOLIC PANEL WITH GFR - Abnormal; Notable for the following components:   Sodium 134 (*)    Glucose, Bld 110 (*)    All other components within normal limits  PROTIME-INR - Abnormal; Notable for the following components:   Prothrombin Time 15.5 (*)    All other components within normal limits  URINALYSIS, W/ REFLEX TO CULTURE (INFECTION SUSPECTED) - Abnormal; Notable for the following components:   Color, Urine AMBER (*)    APPearance CLOUDY (*)    Specific Gravity, Urine 1.034 (*)    Ketones, ur 20 (*)    Protein, ur 100 (*)    Nitrite POSITIVE (*)    Leukocytes,Ua MODERATE (*)    Bacteria, UA RARE (*)    Non Squamous Epithelial 0-5 (*)    All other components within normal limits  BRAIN NATRIURETIC PEPTIDE - Abnormal; Notable for the following components:   B Natriuretic Peptide 177.1 (*)    All other components within normal limits  RESP PANEL BY RT-PCR (RSV, FLU A&B, COVID)  RVPGX2  CULTURE, BLOOD (ROUTINE X 2)  CULTURE, BLOOD (ROUTINE X 2)  URINE CULTURE  APTT  I-STAT CG4 LACTIC ACID, ED    EKG EKG Interpretation Date/Time:  Monday October 05 2023 16:25:31 EDT Ventricular Rate:  97 PR Interval:  150 QRS Duration:  97 QT Interval:  367 QTC Calculation: 467 R Axis:   21  Text Interpretation: Sinus rhythm Left atrial enlargement Left ventricular hypertrophy Inferior infarct, age indeterminate Confirmed by Anders Simmonds [phone]) on 10/05/2023 4:51:03 PM  Radiology CT CHEST ABDOMEN PELVIS W CONTRAST Result Date: 10/05/2023 CLINICAL DATA:  Sepsis.  Fall and left hip pain. EXAM: CT CHEST, ABDOMEN, AND PELVIS WITH CONTRAST TECHNIQUE: Multidetector CT imaging of the chest, abdomen and pelvis was performed following the standard protocol during bolus administration of intravenous contrast. RADIATION DOSE REDUCTION:  This exam was performed according to the departmental dose-optimization program which includes automated exposure control, adjustment of the mA and/or kV according to patient size and/or use of iterative reconstruction technique. CONTRAST:  OMNIPAQUE IOHEXOL 300 MG/ML  SOLN COMPARISON:  Chest CT dated 07/05/2021. FINDINGS: Evaluation of this exam is limited due to respiratory motion. CT CHEST FINDINGS Cardiovascular: Mild cardiomegaly. No pericardial effusion. There is coronary vascular calcification. Moderate  atherosclerotic calcification of the thoracic aorta. No aneurysmal dilatation or dissection. The origins of the great vessels of the aortic arch and the central pulmonary arteries appear patent. Mediastinum/Nodes: No hilar or mediastinal adenopathy. The esophagus is grossly unremarkable. No mediastinal fluid collection Lungs/Pleura: Bibasilar subpleural atelectasis. There is no pleural effusion or pneumothorax. The central airways are patent. Musculoskeletal: Osteopenia with degenerative changes of the spine. Old healed sternal fractures. Multilevel chronic appearing thoracic compression deformities. T6 vertebroplasty. Progression of T5 compression fracture since the prior CT. Correlation with clinical exam and point tenderness recommended. Evaluation for fracture is very limited due to severe osteoporosis. Multiple bilateral breast nodules. Correlation with clinical exam and mammographic studies as clinically indicated. CT ABDOMEN PELVIS FINDINGS No intra-abdominal free air or free fluid. Hepatobiliary: Small liver cyst. Mild biliary dilatation. The gallbladder is unremarkable Pancreas: Unremarkable. No pancreatic ductal dilatation or surrounding inflammatory changes. Spleen: Normal in size without focal abnormality. Adrenals/Urinary Tract: The adrenal glands are unremarkable. There is no hydronephrosis on either side. There is symmetric enhancement and excretion of contrast by both kidneys. Small  bilateral renal cysts. The visualized ureters appear unremarkable. The urinary bladder is grossly unremarkable. Stomach/Bowel: There is sigmoid diverticulosis with muscular hypertrophy. There is no bowel obstruction or active inflammation. The appendix is normal. Vascular/Lymphatic: Advanced aortoiliac atherosclerotic disease. The IVC is unremarkable. No portal venous gas. There is no adenopathy. Reproductive: The uterus is grossly unremarkable. No suspicious adnexal masses. Other: Bilateral inguinal hernias containing short segments of small bowel, left greater than right. No obstruction. Musculoskeletal: Status post prior ORIF of the left femoral neck. Minimally displaced fracture of the right superior pubic ramus and probable nondisplaced fracture of the right inferior pubic ramus. There is a nondisplaced fracture of the left pubic bone extending from the ischial tuberosity into the left inferior pubic ramus. Minimally displaced fracture of the left superior pubic ramus. There is small hematoma in the anterior pelvis. IMPRESSION: 1. No acute intrathoracic pathology. 2. Progression of T5 compression fracture since the prior CT. Correlation with clinical exam and point tenderness recommended. 3. Bilateral superior and inferior pubic rami fractures. 4. Sigmoid diverticulosis. No bowel obstruction. Normal appendix. 5. Bilateral inguinal hernias containing short segments of small bowel, left greater than right. No obstruction. 6. Multiple bilateral breast nodules. Electronically Signed   By: Elgie Collard M.D.   On: 10/05/2023 18:26   CT Head Wo Contrast Result Date: 10/05/2023 CLINICAL DATA:  Ataxia, head trauma. EXAM: CT HEAD WITHOUT CONTRAST TECHNIQUE: Contiguous axial images were obtained from the base of the skull through the vertex without intravenous contrast. RADIATION DOSE REDUCTION: This exam was performed according to the departmental dose-optimization program which includes automated exposure  control, adjustment of the mA and/or kV according to patient size and/or use of iterative reconstruction technique. COMPARISON:  Head CT 07/05/2021 FINDINGS: Brain: No evidence of acute infarction, hemorrhage, hydrocephalus, extra-axial collection or mass lesion/mass effect. Again seen is mild diffuse atrophy and moderate periventricular white matter hypodensity, likely chronic small vessel ischemic change. Vascular: Atherosclerotic calcifications are present within the cavernous internal carotid arteries. Skull: Normal. Negative for fracture or focal lesion. Sinuses/Orbits: No acute finding. Other: None. IMPRESSION: 1. No acute intracranial process. 2. Stable atrophy and chronic small vessel ischemic changes. Electronically Signed   By: Darliss Cheney M.D.   On: 10/05/2023 18:24   CT Cervical Spine Wo Contrast Result Date: 10/05/2023 CLINICAL DATA:  Slipped and fell, ataxia, cervical trauma EXAM: CT CERVICAL SPINE WITHOUT CONTRAST TECHNIQUE: Multidetector CT imaging of  the cervical spine was performed without intravenous contrast. Multiplanar CT image reconstructions were also generated. RADIATION DOSE REDUCTION: This exam was performed according to the departmental dose-optimization program which includes automated exposure control, adjustment of the mA and/or kV according to patient size and/or use of iterative reconstruction technique. COMPARISON:  07/05/2021 FINDINGS: Alignment: Normal. Skull base and vertebrae: No acute fracture. No primary bone lesion or focal pathologic process. Soft tissues and spinal canal: No prevertebral fluid or swelling. No visible canal hematoma. Disc levels: Mild spondylosis at C4-5, C5-6, and C6-7. No significant bony encroachment upon the central canal or neural foramina. Upper chest: Airway is patent. Visualized portions of the lung apices are clear. Other: Reconstructed images demonstrate no additional findings. IMPRESSION: 1. No acute cervical spine fracture. Electronically  Signed   By: Sharlet Salina M.D.   On: 10/05/2023 18:20   DG FEMUR MIN 2 VIEWS LEFT Result Date: 10/05/2023 CLINICAL DATA:  Left hip pain after fall, prior left hip fracture status post ORIF EXAM: LEFT FEMUR 2 VIEWS COMPARISON:  04/15/2023 FINDINGS: Frontal and cross-table lateral views of the left femur are obtained. Intramedullary rod with proximal dynamic screws traverse a prior healed intertrochanteric left hip fracture. Please refer to left hip study describing subtle lucencies through the left superior and inferior pubic rami suspicious for nondisplaced fractures. No evidence of acute femoral fracture. Mild osteoarthritis of the left hip and knee. Prominent soft tissue swelling overlying the lateral aspect of the left hip consistent with contusion. IMPRESSION: 1. Equivocal findings for nondisplaced left superior and inferior pubic rami fractures. CT recommended. Please see left hip exam for complete characterization. 2. Prior ORIF of a healed intertrochanteric left hip fracture. Otherwise unremarkable left femur. 3. Soft tissue swelling overlying the lateral aspect left hip, consistent with contusion. Electronically Signed   By: Sharlet Salina M.D.   On: 10/05/2023 16:42   DG Chest Portable 1 View Result Date: 10/05/2023 CLINICAL DATA:  Slipped and fell, cough EXAM: PORTABLE CHEST 1 VIEW COMPARISON:  07/05/2021 FINDINGS: Single frontal view of the chest demonstrates stable enlargement of the cardiac silhouette. Lung volumes are diminished, with chronic the pulmonary vasculature. No airspace disease, effusion, or pneumothorax. No acute displaced fracture. Prior midthoracic vertebral augmentation. Diffuse atherosclerosis. IMPRESSION: 1. Low lung volumes.  No acute airspace disease. Electronically Signed   By: Sharlet Salina M.D.   On: 10/05/2023 16:41   DG Hip Unilat With Pelvis 2-3 Views Left Result Date: 10/05/2023 CLINICAL DATA:  Slipped and fell, left hip pain, inability to bear weight EXAM: DG HIP  (WITH OR WITHOUT PELVIS) 2-3V LEFT COMPARISON:  04/15/2023 FINDINGS: Frontal view of the pelvis as well as frontal and cross-table lateral views of the left hip are obtained. The proximal extent of an intramedullary rod and 2 dynamic screws are seen traversing the left hip, without significant change since prior study. Heterotopic ossification adjacent to the left lesser trochanter. Prior healed left inferior pubic ramus fracture. There are subtle lucencies of the left superior pubic ramus and left inferior ramus extending to the ischium, best seen on the frontal view of the left hip. These are likely due to overlying skin folds are superimposed shadows, though minimally displaced fractures cannot be excluded in light of trauma and inability to bear weight. Further evaluation with CT may be useful. Stable mild bilateral hip osteoarthritis. Marked subcutaneous edema overlying the lateral aspect of the left hip. IMPRESSION: 1. Equivocal findings for nondisplaced fractures through the left superior ramus and left inferior ramus/ischium. CT  may be useful for further evaluation. 2. Extensive subcutaneous edema over the lateral aspect of the left hip, compatible with contusion after fall. 3. Stable ORIF of a prior healed intertrochanteric left hip fracture. 4. Stable bilateral hip osteoarthritis. Electronically Signed   By: Sharlet Salina M.D.   On: 10/05/2023 16:40    Procedures .Critical Care  Performed by: Arletha Pili, DO Authorized by: Arletha Pili, DO   Critical care provider statement:    Critical care time (minutes):  33   Critical care was time spent personally by me on the following activities:  Development of treatment plan with patient or surrogate, discussions with consultants, evaluation of patient's response to treatment, examination of patient, ordering and review of laboratory studies, ordering and review of radiographic studies, ordering and performing treatments and interventions,  pulse oximetry, re-evaluation of patient's condition and review of old charts     Medications Ordered in ED Medications  lactated ringers infusion ( Intravenous New Bag/Given 10/05/23 1810)  azithromycin (ZITHROMAX) 500 mg in sodium chloride 0.9 % 250 mL IVPB (500 mg Intravenous New Bag/Given 10/05/23 1810)  cefTRIAXone (ROCEPHIN) 2 g in sodium chloride 0.9 % 100 mL IVPB (0 g Intravenous Stopped 10/05/23 1724)  iohexol (OMNIPAQUE) 300 MG/ML solution 100 mL (100 mLs Intravenous Contrast Given 10/05/23 1735)    ED Course/ Medical Decision Making/ A&P                                 Medical Decision Making 89 year old female here today with left hip pain after a fall.  Plan-on exam, patient alert, oriented.  Able to provide clear history of event that happened.  Appears patient was standing up from recliner, lost her balance and went over onto her left side where she is experiencing pain.  There is no head strike, no neurological deficits.  No tenderness in the upper extremities, chest, abdomen and pelvis.  Left leg does appear shortened.  Good PT and DP pulses in the left.  Patient able to wiggle toes.  Plain films of the left hip ordered.  Basic labs, CT imaging of the head and neck ordered.  Reassessment 6:40 PM-patient requiring supplemental O2, is currently on 3 L.  Her chest x-ray showed some possible consolidation per my independent review, however formal read did not show overt pneumonia.  Begin to treat the patient with community-acquired pneumonia antibiotics.  Obtained CT imaging of the patient's chest abdomen pelvis given the fall.  CT imaging showed no central pulmonary embolism, no pneumonia.  No pneumothorax.  CT abdomen pelvis negative aside for pubic rami fracture.  No fracture of the associated hardware.  Patient's urinalysis shows UTI.  She does endorse that she has been feeling unwell lately.  Believe this likely contributed to her fall.  Rocephin to cover her UTI.  Will  admit patient to hospitalist for urinary tract infection, fall, oxygen use.  I do not believe patient has sepsis at this time.  Amount and/or Complexity of Data Reviewed Labs: ordered. Radiology: ordered.  Risk Prescription drug management. Decision regarding hospitalization.           Final Clinical Impression(s) / ED Diagnoses Final diagnoses:  Acute cystitis without hematuria  Closed fracture of pubic ramus, unspecified laterality, initial encounter (HCC)  Hypoxia    Rx / DC Orders ED Discharge Orders     None         Arletha Pili,  DO 10/05/23 1846

## 2023-10-06 ENCOUNTER — Encounter (HOSPITAL_COMMUNITY): Payer: Self-pay | Admitting: Internal Medicine

## 2023-10-06 DIAGNOSIS — I2510 Atherosclerotic heart disease of native coronary artery without angina pectoris: Secondary | ICD-10-CM

## 2023-10-06 DIAGNOSIS — K4020 Bilateral inguinal hernia, without obstruction or gangrene, not specified as recurrent: Secondary | ICD-10-CM | POA: Diagnosis present

## 2023-10-06 DIAGNOSIS — E039 Hypothyroidism, unspecified: Secondary | ICD-10-CM

## 2023-10-06 DIAGNOSIS — S22059A Unspecified fracture of T5-T6 vertebra, initial encounter for closed fracture: Secondary | ICD-10-CM | POA: Diagnosis present

## 2023-10-06 DIAGNOSIS — Z79899 Other long term (current) drug therapy: Secondary | ICD-10-CM | POA: Diagnosis not present

## 2023-10-06 DIAGNOSIS — Z8249 Family history of ischemic heart disease and other diseases of the circulatory system: Secondary | ICD-10-CM | POA: Diagnosis not present

## 2023-10-06 DIAGNOSIS — Z888 Allergy status to other drugs, medicaments and biological substances status: Secondary | ICD-10-CM | POA: Diagnosis not present

## 2023-10-06 DIAGNOSIS — J189 Pneumonia, unspecified organism: Secondary | ICD-10-CM | POA: Diagnosis present

## 2023-10-06 DIAGNOSIS — E7800 Pure hypercholesterolemia, unspecified: Secondary | ICD-10-CM | POA: Diagnosis present

## 2023-10-06 DIAGNOSIS — W010XXA Fall on same level from slipping, tripping and stumbling without subsequent striking against object, initial encounter: Secondary | ICD-10-CM | POA: Diagnosis present

## 2023-10-06 DIAGNOSIS — R0902 Hypoxemia: Secondary | ICD-10-CM | POA: Diagnosis present

## 2023-10-06 DIAGNOSIS — Z87891 Personal history of nicotine dependence: Secondary | ICD-10-CM | POA: Diagnosis not present

## 2023-10-06 DIAGNOSIS — K5730 Diverticulosis of large intestine without perforation or abscess without bleeding: Secondary | ICD-10-CM | POA: Diagnosis present

## 2023-10-06 DIAGNOSIS — I10 Essential (primary) hypertension: Secondary | ICD-10-CM

## 2023-10-06 DIAGNOSIS — N3000 Acute cystitis without hematuria: Secondary | ICD-10-CM | POA: Diagnosis present

## 2023-10-06 DIAGNOSIS — Z9861 Coronary angioplasty status: Secondary | ICD-10-CM | POA: Diagnosis not present

## 2023-10-06 DIAGNOSIS — Y92009 Unspecified place in unspecified non-institutional (private) residence as the place of occurrence of the external cause: Secondary | ICD-10-CM | POA: Diagnosis not present

## 2023-10-06 DIAGNOSIS — Z1152 Encounter for screening for COVID-19: Secondary | ICD-10-CM | POA: Diagnosis not present

## 2023-10-06 DIAGNOSIS — Z7982 Long term (current) use of aspirin: Secondary | ICD-10-CM | POA: Diagnosis not present

## 2023-10-06 DIAGNOSIS — Z79890 Hormone replacement therapy (postmenopausal): Secondary | ICD-10-CM | POA: Diagnosis not present

## 2023-10-06 DIAGNOSIS — S32511A Fracture of superior rim of right pubis, initial encounter for closed fracture: Secondary | ICD-10-CM | POA: Diagnosis present

## 2023-10-06 DIAGNOSIS — E871 Hypo-osmolality and hyponatremia: Secondary | ICD-10-CM | POA: Diagnosis present

## 2023-10-06 DIAGNOSIS — S32512A Fracture of superior rim of left pubis, initial encounter for closed fracture: Secondary | ICD-10-CM | POA: Diagnosis present

## 2023-10-06 DIAGNOSIS — E785 Hyperlipidemia, unspecified: Secondary | ICD-10-CM | POA: Diagnosis not present

## 2023-10-06 DIAGNOSIS — S32591A Other specified fracture of right pubis, initial encounter for closed fracture: Secondary | ICD-10-CM | POA: Diagnosis present

## 2023-10-06 DIAGNOSIS — Z803 Family history of malignant neoplasm of breast: Secondary | ICD-10-CM | POA: Diagnosis not present

## 2023-10-06 DIAGNOSIS — Z8262 Family history of osteoporosis: Secondary | ICD-10-CM | POA: Diagnosis not present

## 2023-10-06 DIAGNOSIS — S32592A Other specified fracture of left pubis, initial encounter for closed fracture: Secondary | ICD-10-CM | POA: Diagnosis present

## 2023-10-06 LAB — BASIC METABOLIC PANEL WITH GFR
Anion gap: 7 (ref 5–15)
BUN: 22 mg/dL (ref 8–23)
CO2: 22 mmol/L (ref 22–32)
Calcium: 8.3 mg/dL — ABNORMAL LOW (ref 8.9–10.3)
Chloride: 105 mmol/L (ref 98–111)
Creatinine, Ser: <0.3 mg/dL — ABNORMAL LOW (ref 0.44–1.00)
Glucose, Bld: 103 mg/dL — ABNORMAL HIGH (ref 70–99)
Potassium: 3.9 mmol/L (ref 3.5–5.1)
Sodium: 134 mmol/L — ABNORMAL LOW (ref 135–145)

## 2023-10-06 LAB — URINE CULTURE

## 2023-10-06 LAB — CBC
HCT: 29.4 % — ABNORMAL LOW (ref 36.0–46.0)
Hemoglobin: 9.4 g/dL — ABNORMAL LOW (ref 12.0–15.0)
MCH: 35.1 pg — ABNORMAL HIGH (ref 26.0–34.0)
MCHC: 32 g/dL (ref 30.0–36.0)
MCV: 109.7 fL — ABNORMAL HIGH (ref 80.0–100.0)
Platelets: 160 10*3/uL (ref 150–400)
RBC: 2.68 MIL/uL — ABNORMAL LOW (ref 3.87–5.11)
RDW: 14.5 % (ref 11.5–15.5)
WBC: 5.6 10*3/uL (ref 4.0–10.5)
nRBC: 0 % (ref 0.0–0.2)

## 2023-10-06 NOTE — Assessment & Plan Note (Signed)
 10-06-2023 pt does not leave the home per pt and her husband. Does not use walker or cane at home. PT/OT consulted for assessment.

## 2023-10-06 NOTE — Care Management Obs Status (Signed)
 MEDICARE OBSERVATION STATUS NOTIFICATION   Patient Details  Name: LYNNIE KOEHLER MRN: [id number] Date of Birth: [birth year]   Medicare Observation Status Notification Given:  Yes    Jessie Foot, RN 10/06/2023, 10:29 AM

## 2023-10-06 NOTE — Assessment & Plan Note (Signed)
 10-06-2023 will need PT/OT to assess ability to return home vs need for SNF. Non-operative fracture. Will need outpatient referral to [facility] after discharge for f/u.

## 2023-10-06 NOTE — Assessment & Plan Note (Signed)
 10-06-2023 stable.on ASA and lipitor.

## 2023-10-06 NOTE — TOC Initial Note (Addendum)
 Transition of Care [facility]) - Initial/Assessment Note    Patient Details  Name: Natasha Harper MRN: [id number] Date of Birth: Dec 29, 1934  Transition of Care [facility]) CM/SW Contact:    Jessie Foot, RN Phone Number: 10/06/2023, 10:30 AM  Clinical Narrative:                 MOON given. Patient presented for a fall via EMS. PTA patient states she was not ambulating in the community due to comfort level.; lives in a single family home with spouse Theron Arista [phone]); DME: cane, walker, shower chair, and transfer wheelchair.; No HH or oxygen. Patient and spouse said that if [facility] or SNF is recommended would prefer [facility]. Confirmed insurance and PCP.; states want a new PCP; Given a list of name with [facility]. Instructed to make hospital follow-up  appointment.  Patient's spouse would transport home at discharge. TOC will follow for progression to discharge.  3:49 PM FL2, SNF referral sent to [facility] and [facility] to be  started.  Expected Discharge Plan: Home/Self Care Barriers to Discharge: Continued Medical Work up   Patient Goals and CMS Choice Patient states their goals for this hospitalization and ongoing recovery are:: Patient palns to return home with spouse          Expected Discharge Plan and Services   Discharge Planning Services: CM Consult   Living arrangements for the past 2 months: Single Family Home                                      Prior Living Arrangements/Services Living arrangements for the past 2 months: Single Family Home Lives with:: Spouse Patient language and need for interpreter reviewed:: Yes Do you feel safe going back to the place where you live?: Yes      Need for Family Participation in Patient Care: Yes (Comment) Care giver support system in place?: Yes (comment) Current home services: DME (cane, walker, shoer chair, transfer wheel chair) Criminal Activity/Legal Involvement Pertinent to Current  Situation/Hospitalization: No - Comment as needed  Activities of Daily Living   ADL Screening (condition at time of admission) Independently performs ADLs?: No Does the patient have a NEW difficulty with bathing/dressing/toileting/self-feeding that is expected to last >3 days?: Yes (Initiates electronic notice to provider for possible OT consult) Does the patient have a NEW difficulty with getting in/out of bed, walking, or climbing stairs that is expected to last >3 days?: Yes (Initiates electronic notice to provider for possible PT consult) Does the patient have a NEW difficulty with communication that is expected to last >3 days?: No Is the patient deaf or have difficulty hearing?: Yes Does the patient have difficulty seeing, even when wearing glasses/contacts?: No Does the patient have difficulty concentrating, remembering, or making decisions?: No  Permission Sought/Granted Permission sought to share information with : Case Manager Permission granted to share information with : Yes, Verbal Permission Granted              Emotional Assessment Appearance:: Appears stated age Attitude/Demeanor/Rapport: Engaged Affect (typically observed): Accepting, Appropriate Orientation: : Oriented to Self, Oriented to Place, Oriented to  Time, Oriented to Situation Alcohol / Substance Use: Not Applicable Psych Involvement: No (comment)  Admission diagnosis:  Hypoxia [R09.02] Acute cystitis without hematuria [N30.00] Closed fracture of pubic ramus, unspecified laterality, initial encounter [facility]) [S32.599A] Patient Active Problem List   Diagnosis Date Noted  Acute cystitis without hematuria 10/05/2023   Hypoxia 10/05/2023   Bilateral pubic rami fractures, closed, initial encounter (HCC) 10/05/2023   Acute urinary retention 09/08/2021   Hip fracture (HCC) 09/05/2021   Fall at home, initial encounter 09/05/2021   Primary hypertension    Elevated troponin    Demand ischemia [facility])     Thoracic compression fracture (HCC) 07/05/2021   Vertebral fracture, osteoporotic (HCC) 04/15/2021   Hyponatremia 04/14/2021   Hypokalemia 04/14/2021   Anemia 04/14/2021   Compression fracture of thoracic vertebra, closed, initial encounter (HCC) 04/13/2021   Sacral fracture, closed (HCC) 04/13/2021   Compression fracture of thoracic vertebra (HCC) 02/06/2021   Compression of lumbar vertebra (HCC) 02/06/2021   Back pain 02/05/2021   Medicare annual wellness visit, subsequent 12/20/2014   Decreased hearing 10/05/2013   CAD (coronary artery disease)    Dyslipidemia    Carotid arterial disease (HCC)    Hypothyroidism    Ejection fraction    Visit for preventive health examination 06/02/2011   Medication side effect 06/02/2011   Disorder of bone and cartilage 04/07/2008   HYPERTENSION, WHITE COAT 04/07/2008   HYPERGLYCEMIA, FASTING 07/21/2007   THYROID NODULE 04/05/2007   PCP:  [name], MD Pharmacy:   [facility] [address] [place] - [address] [place] [address] Phone: [phone] Fax: [phone]  [facility] - [place], [place] - [address] [facility] [place] [address] Phone: [phone] Fax: [phone]     Social Drivers of Health (SDOH) Social History: SDOH Screenings   Food Insecurity: No Food Insecurity (10/05/2023)  Housing: Unknown (10/05/2023)  Transportation Needs: No Transportation Needs (10/05/2023)  Utilities: Patient Declined (10/05/2023)  Depression (PHQ2-9): Low Risk  (01/28/2022)  Social Connections: Socially Isolated (10/05/2023)  Tobacco Use: Medium Risk (10/05/2023)   SDOH Interventions:     Readmission Risk Interventions    07/17/2021    3:01 PM  Readmission Risk Prevention Plan  Transportation Screening Complete  PCP or Specialist Appt within 3-5 Days Not Complete  Not Complete comments Plan to d/c to [facility] Inpatient rehab  HRI or Home Care  Consult Not Complete  HRI or Home Care Consult comments Patient to d/c to [facility] Inpatient rehab  Social Work Consult for Recovery Care Planning/Counseling Complete  Palliative Care Screening Not Applicable  Medication Review (RN Care Manager) Referral to Pharmacy

## 2023-10-06 NOTE — Plan of Care (Signed)

## 2023-10-06 NOTE — Assessment & Plan Note (Signed)
 10-06-2023 continue lipitor.

## 2023-10-06 NOTE — Progress Notes (Signed)
 PROGRESS NOTE    Natasha Harper  ZOX:[id number] DOB: 1934-11-14 DOA: 10/05/2023 PCP: [name], MD  Subjective: Pt seen and examined. Met with pt and her husband at bedside. Pt states she slipped at home on slippery part of her wooden floor. Fell directly onto her buttocks. Has buttock pain.  CT/XR shows bilateral pubic rami fracture. This is a non-operative fracture and is WBAT. Discussed this with ortho. No operation needed.  Pt needs PT/OT evaluation.  Pt has been to [facility] in the past for rehab after femur fracture.  Pt being treated with IV ABX for potential UTI.   Hospital Course: HPI: Natasha Harper is a 89 y.o. female with medical history significant for CAD s/p PCI, HTN, HLD, hypothyroidism, Hx of left hip fracture s/p ORIF 09/2021 who presented to the ED for evaluation of left hip pain after fall at home.   Patient states that she was getting up from her recliner earlier today when she slipped and fell onto her buttocks.  She had subsequent left hip/groin pain and was unable to stand or ambulate on her home.  She denied any head injury or loss of consciousness.  She denied associated lightheadedness, dizziness, chest pain, dyspnea.   Husband states that her ambulatory status has been diminished over the last 2 years.   ED Course  Labs/Imaging on admission: I have personally reviewed following labs and imaging studies.   Initial vitals showed BP 175/86, pulse 102, RR 19, temp 97.8 F, SpO2 94% on 6 L O2 via [place].  Per EDP SpO2 was in mid 80s on room air.   Labs showed WBC 7.8, hemoglobin 11.3, platelets 212,000, sodium 134, potassium 4.1, bicarb 23, BUN 22, creatinine 0.44, serum glucose 110, LFTs within normal limits, BNP 177.1, lactic acid 1.1.   UA with 20 ketones, 100 protein, positive nitrites, moderate leukocytes, 6-10 RBCs, 21-50 WBCs, rare bacteria.  Urine and blood cultures in process.  SARS-CoV-2, influenza, RSV PCR negative.   Left hip x-ray showed  equivocal findings for nondisplaced fractures through the left superior ramus and left inferior ramus/ischium.   CT head without contrast negative for acute intracranial process.   CT cervical spine without contrast negative for acute cervical spine fracture.   CT chest/abdomen/pelvis with contrast was negative for acute intrathoracic pathology.  Progression of T5 compression fracture since prior CT noted.  Bilateral superior and inferior pubic rami fractures noted.  Sigmoid diverticulosis seen.  No bowel obstruction.  Normal-appearing appendix.  Bilateral inguinal hernias containing short segment of small bowel, left greater than right.  No obstruction.  Multiple bilateral breast nodules also noted.   Patient was given IV ceftriaxone and azithromycin.  The hospitalist service was consulted to admit.  Significant Events: Admitted 10/05/2023 fall at home, bilateral pubic rami fractures   Significant Labs: WBC 7.8, HgB 11.3, plt 212 Na 134, K 4.1, CO2 of 23, BUN 22, Scr 0.44. glu 110 UA spg 1.034, +nitrite, +LE, rare bacteria BNP 177  Significant Imaging Studies: Left hip XR Equivocal findings for nondisplaced fractures through the left superior ramus and left inferior ramus/ischium. CT may be useful for further evaluation. 2. Extensive subcutaneous edema over the lateral aspect of the left hip, compatible with contusion after fall.  3. Stable ORIF of a prior healed intertrochanteric left hip fracture. 4. Stable bilateral hip osteoarthritis. CXR Low lung volumes. No acute airspace disease.  Left femur XR Equivocal findings for nondisplaced left superior and inferior pubic rami fractures. CT recommended. Please see  left hip exam for complete characterization. 2. Prior ORIF of a healed intertrochanteric left hip fracture. Otherwise unremarkable left femur.  3. Soft tissue swelling overlying the lateral aspect left hip, consistent with contusion. CT head No acute intracranial process. 2. Stable  atrophy and chronic small vessel ischemic changes. CT c-spine No acute cervical spine fracture.  CT chest/abd/pelvis No acute intrathoracic pathology. 2. Progression of T5 compression fracture since the prior CT. Correlation with clinical exam and point tenderness recommended. 3. Bilateral superior and inferior pubic rami fractures. 4. Sigmoid diverticulosis. No bowel obstruction. Normal appendix. 5. Bilateral inguinal hernias containing short segments of small bowel, left greater than right. No obstruction. 6. Multiple bilateral breast nodules.  Antibiotic Therapy: Anti-infectives (From admission, onward)    Start     Dose/Rate Route Frequency Ordered Stop   10/06/23 1600  cefTRIAXone (ROCEPHIN) 1 g in sodium chloride 0.9 % 100 mL IVPB        1 g 200 mL/hr over 30 Minutes Intravenous Every 24 hours 10/05/23 2048     10/05/23 1615  cefTRIAXone (ROCEPHIN) 2 g in sodium chloride 0.9 % 100 mL IVPB        2 g 200 mL/hr over 30 Minutes Intravenous Once 10/05/23 1609 10/05/23 1724   10/05/23 1615  azithromycin (ZITHROMAX) 500 mg in sodium chloride 0.9 % 250 mL IVPB        500 mg 250 mL/hr over 60 Minutes Intravenous  Once 10/05/23 1609 10/05/23 1921       Procedures:   Consultants:     Assessment and Plan: * Bilateral pubic rami fractures, closed, initial encounter (HCC) 10-06-2023 will need PT/OT to assess ability to return home vs need for SNF. Non-operative fracture. Will need outpatient referral to [facility] after discharge for f/u.  Hypoxia 10-06-2023 doubt PE. Likely due to pain meds. Wean to RA. Not on home O2.  Acute cystitis without hematuria 10-06-2023 remains on IV Rocephin. Awaiting urine cx.  Fall at home, initial encounter 10-06-2023 pt does not leave the home per pt and her husband. Does not use walker or cane at home. PT/OT consulted for assessment.  Primary hypertension 10-06-2023 husband states pt not taking any BP meds at home. Monitor  BP.  Hypothyroidism 10-06-2023 continue synthroid  Dyslipidemia 10-06-2023 continue lipitor.  CAD (coronary artery disease) 10-06-2023 stable.on ASA and lipitor.   DVT prophylaxis: enoxaparin (LOVENOX) injection 40 mg Start: 10/06/23 2200    Code Status: Full Code Family Communication: discussed with pt and her husband Theron Arista at bedside Disposition Plan: home vs SNF Reason for continuing need for hospitalization: remains on IV Abx. Awaiting PT/OT assessments. Attempt to wean off supplemental O2.  Objective: Vitals:   10/05/23 2009 10/05/23 2015 10/06/23 0028 10/06/23 0558  BP: (!) 167/84  (!) 158/75 139/63  Pulse: 100  95 94  Resp: 20  20 20   Temp: 98 F (36.7 C)  98.2 F (36.8 C) 98.2 F (36.8 C)  TempSrc: Oral  Oral Oral  SpO2: 92%  99% 99%  Weight:  64.2 kg    Height:  5\' 1"  (1.549 m)      Intake/Output Summary (Last 24 hours) at 10/06/2023 1142 Last data filed at 10/06/2023 1003 Gross per 24 hour  Intake 100 ml  Output --  Net 100 ml   Filed Weights   10/05/23 2015  Weight: 64.2 kg    Examination:  Physical Exam Vitals and nursing note reviewed.  Constitutional:      General: She is not  in acute distress.    Appearance: She is not toxic-appearing or diaphoretic.  HENT:     Head: Normocephalic and atraumatic.     Nose: Nose normal.  Eyes:     General: No scleral icterus. Cardiovascular:     Rate and Rhythm: Normal rate and regular rhythm.  Pulmonary:     Effort: Pulmonary effort is normal.     Breath sounds: Normal breath sounds.  Abdominal:     General: Bowel sounds are normal. There is no distension.     Palpations: Abdomen is soft.  Musculoskeletal:     Right lower leg: No edema.     Left lower leg: No edema.  Skin:    General: Skin is warm and dry.     Capillary Refill: Capillary refill takes less than 2 seconds.  Neurological:     Mental Status: She is alert and oriented to person, place, and time.     Comments: Slightly hard of hearing      Data Reviewed: I have personally reviewed following labs and imaging studies  CBC: Recent Labs  Lab 10/05/23 1626 10/06/23 0432  WBC 7.8 5.6  NEUTROABS 6.9  --   HGB 11.3* 9.4*  HCT 35.5* 29.4*  MCV 108.6* 109.7*  PLT 212 160   Basic Metabolic Panel: Recent Labs  Lab 10/05/23 1626 10/06/23 0432  NA 134* 134*  K 4.1 3.9  CL 102 105  CO2 23 22  GLUCOSE 110* 103*  BUN 22 22  CREATININE 0.44 <0.30*  CALCIUM 9.0 8.3*   GFR: CrCl cannot be calculated (This lab value cannot be used to calculate CrCl because it is not a number: <0.30). Liver Function Tests: Recent Labs  Lab 10/05/23 1626  AST 31  ALT 37  ALKPHOS 89  BILITOT 0.4  PROT 6.5  ALBUMIN 3.6   Coagulation Profile: Recent Labs  Lab 10/05/23 1626  INR 1.2   BNP (last 3 results) Recent Labs    10/05/23 1626  BNP 177.1*   Sepsis Labs: Recent Labs  Lab 10/05/23 1654  LATICACIDVEN 1.1    Recent Results (from the past 240 hours)  Resp panel by RT-PCR (RSV, Flu A&B, Covid) Anterior Nasal Swab     Status: None   Collection Time: 10/05/23  4:26 PM   Specimen: Anterior Nasal Swab  Result Value Ref Range Status   SARS Coronavirus 2 by RT PCR NEGATIVE NEGATIVE Final    Comment: (NOTE) SARS-CoV-2 target nucleic acids are NOT DETECTED.  The SARS-CoV-2 RNA is generally detectable in upper respiratory specimens during the acute phase of infection. The lowest concentration of SARS-CoV-2 viral copies this assay can detect is 138 copies/mL. A negative result does not preclude SARS-Cov-2 infection and should not be used as the sole basis for treatment or other patient management decisions. A negative result may occur with  improper specimen collection/handling, submission of specimen other than nasopharyngeal swab, presence of viral mutation(s) within the areas targeted by this assay, and inadequate number of viral copies(<138 copies/mL). A negative result must be combined with clinical observations,  patient history, and epidemiological information. The expected result is Negative.  Fact Sheet for Patients:  BloggerCourse.com  Fact Sheet for Healthcare Providers:  SeriousBroker.it  This test is no t yet approved or cleared by the [place] FDA and  has been authorized for detection and/or diagnosis of SARS-CoV-2 by FDA under an Emergency Use Authorization (EUA). This EUA will remain  in effect (meaning this test can be used) for  the duration of the COVID-19 declaration under Section 564(b)(1) of the Act, 21 U.S.C.section 360bbb-3(b)(1), unless the authorization is terminated  or revoked sooner.       Influenza A by PCR NEGATIVE NEGATIVE Final   Influenza B by PCR NEGATIVE NEGATIVE Final    Comment: (NOTE) The Xpert Xpress SARS-CoV-2/FLU/RSV plus assay is intended as an aid in the diagnosis of influenza from Nasopharyngeal swab specimens and should not be used as a sole basis for treatment. Nasal washings and aspirates are unacceptable for Xpert Xpress SARS-CoV-2/FLU/RSV testing.  Fact Sheet for Patients: BloggerCourse.com  Fact Sheet for Healthcare Providers: SeriousBroker.it  This test is not yet approved or cleared by the [place] FDA and has been authorized for detection and/or diagnosis of SARS-CoV-2 by FDA under an Emergency Use Authorization (EUA). This EUA will remain in effect (meaning this test can be used) for the duration of the COVID-19 declaration under Section 564(b)(1) of the Act, 21 U.S.C. section 360bbb-3(b)(1), unless the authorization is terminated or revoked.     Resp Syncytial Virus by PCR NEGATIVE NEGATIVE Final    Comment: (NOTE) Fact Sheet for Patients: BloggerCourse.com  Fact Sheet for Healthcare Providers: SeriousBroker.it  This test is not yet approved or cleared by the [place] FDA and has been authorized for detection and/or diagnosis of SARS-CoV-2 by FDA under an Emergency Use Authorization (EUA). This EUA will remain in effect (meaning this test can be used) for the duration of the COVID-19 declaration under Section 564(b)(1) of the Act, 21 U.S.C. section 360bbb-3(b)(1), unless the authorization is terminated or revoked.  Performed at [facility], [address], [place], [place] [address]   Blood Culture (routine x 2)     Status: None (Preliminary result)   Collection Time: 10/05/23  4:33 PM   Specimen: BLOOD  Result Value Ref Range Status   Specimen Description   Final    BLOOD RIGHT ANTECUBITAL Performed at [facility], [address], [place], [place] [address]    Special Requests   Final    BOTTLES DRAWN AEROBIC AND ANAEROBIC Blood Culture results may not be optimal due to an inadequate volume of blood received in culture bottles Performed at [facility], [address], [place], [place] [address]    Culture   Final    NO GROWTH < 24 HOURS Performed at [facility], [address], [place], [place] [address]    Report Status PENDING  Incomplete  Blood Culture (routine x 2)     Status: None (Preliminary result)   Collection Time: 10/05/23  4:50 PM   Specimen: BLOOD RIGHT HAND  Result Value Ref Range Status   Specimen Description   Final    BLOOD RIGHT HAND Performed at [facility], [address] [place], [place] [address]    Special Requests   Final    BOTTLES DRAWN AEROBIC AND ANAEROBIC Blood Culture results may not be optimal due to an inadequate volume of blood received in culture bottles Performed at [facility], [address] [place], [place] [address]    Culture   Final    NO GROWTH < 24 HOURS Performed at [facility], [address] [place], [place] [address]    Report Status PENDING  Incomplete  Urine Culture     Status:  None (Preliminary result)   Collection Time: 10/05/23  6:11 PM   Specimen: Urine, Clean Catch  Result Value  Ref Range Status   Specimen Description   Final    URINE, CLEAN CATCH Performed at [facility], [address], [place], [place] [address]    Special Requests   Final    NONE Reflexed from [id number] Performed at [facility], [address] [place], [place] [address]    Culture PENDING  Incomplete   Report Status PENDING  Incomplete     Radiology Studies: CT CHEST ABDOMEN PELVIS W CONTRAST Result Date: 10/05/2023 CLINICAL DATA:  Sepsis.  Fall and left hip pain. EXAM: CT CHEST, ABDOMEN, AND PELVIS WITH CONTRAST TECHNIQUE: Multidetector CT imaging of the chest, abdomen and pelvis was performed following the standard protocol during bolus administration of intravenous contrast. RADIATION DOSE REDUCTION: This exam was performed according to the departmental dose-optimization program which includes automated exposure control, adjustment of the mA and/or kV according to patient size and/or use of iterative reconstruction technique. CONTRAST:  OMNIPAQUE IOHEXOL 300 MG/ML  SOLN COMPARISON:  Chest CT dated 07/05/2021. FINDINGS: Evaluation of this exam is limited due to respiratory motion. CT CHEST FINDINGS Cardiovascular: Mild cardiomegaly. No pericardial effusion. There is coronary vascular calcification. Moderate atherosclerotic calcification of the thoracic aorta. No aneurysmal dilatation or dissection. The origins of the great vessels of the aortic arch and the central pulmonary arteries appear patent. Mediastinum/Nodes: No hilar or mediastinal adenopathy. The esophagus is grossly unremarkable. No mediastinal fluid collection Lungs/Pleura: Bibasilar subpleural atelectasis. There is no pleural effusion or pneumothorax. The central airways are patent. Musculoskeletal: Osteopenia with degenerative changes of the spine. Old healed sternal fractures. Multilevel chronic  appearing thoracic compression deformities. T6 vertebroplasty. Progression of T5 compression fracture since the prior CT. Correlation with clinical exam and point tenderness recommended. Evaluation for fracture is very limited due to severe osteoporosis. Multiple bilateral breast nodules. Correlation with clinical exam and mammographic studies as clinically indicated. CT ABDOMEN PELVIS FINDINGS No intra-abdominal free air or free fluid. Hepatobiliary: Small liver cyst. Mild biliary dilatation. The gallbladder is unremarkable Pancreas: Unremarkable. No pancreatic ductal dilatation or surrounding inflammatory changes. Spleen: Normal in size without focal abnormality. Adrenals/Urinary Tract: The adrenal glands are unremarkable. There is no hydronephrosis on either side. There is symmetric enhancement and excretion of contrast by both kidneys. Small bilateral renal cysts. The visualized ureters appear unremarkable. The urinary bladder is grossly unremarkable. Stomach/Bowel: There is sigmoid diverticulosis with muscular hypertrophy. There is no bowel obstruction or active inflammation. The appendix is normal. Vascular/Lymphatic: Advanced aortoiliac atherosclerotic disease. The IVC is unremarkable. No portal venous gas. There is no adenopathy. Reproductive: The uterus is grossly unremarkable. No suspicious adnexal masses. Other: Bilateral inguinal hernias containing short segments of small bowel, left greater than right. No obstruction. Musculoskeletal: Status post prior ORIF of the left femoral neck. Minimally displaced fracture of the right superior pubic ramus and probable nondisplaced fracture of the right inferior pubic ramus. There is a nondisplaced fracture of the left pubic bone extending from the ischial tuberosity into the left inferior pubic ramus. Minimally displaced fracture of the left superior pubic ramus. There is small hematoma in the anterior pelvis. IMPRESSION: 1. No acute intrathoracic pathology. 2.  Progression of T5 compression fracture since the prior CT. Correlation with clinical exam and point tenderness recommended. 3. Bilateral superior and inferior pubic rami fractures. 4. Sigmoid diverticulosis. No bowel obstruction. Normal appendix. 5. Bilateral inguinal hernias containing short segments of small bowel, left greater than right. No obstruction. 6. Multiple bilateral breast nodules. Electronically Signed   By: Burtis Junes  Radparvar M.D.   On: 10/05/2023 18:26   CT Head Wo Contrast Result Date: 10/05/2023 CLINICAL DATA:  Ataxia, head trauma. EXAM: CT HEAD WITHOUT CONTRAST TECHNIQUE: Contiguous axial images were obtained from the base of the skull through the vertex without intravenous contrast. RADIATION DOSE REDUCTION: This exam was performed according to the departmental dose-optimization program which includes automated exposure control, adjustment of the mA and/or kV according to patient size and/or use of iterative reconstruction technique. COMPARISON:  Head CT 07/05/2021 FINDINGS: Brain: No evidence of acute infarction, hemorrhage, hydrocephalus, extra-axial collection or mass lesion/mass effect. Again seen is mild diffuse atrophy and moderate periventricular white matter hypodensity, likely chronic small vessel ischemic change. Vascular: Atherosclerotic calcifications are present within the cavernous internal carotid arteries. Skull: Normal. Negative for fracture or focal lesion. Sinuses/Orbits: No acute finding. Other: None. IMPRESSION: 1. No acute intracranial process. 2. Stable atrophy and chronic small vessel ischemic changes. Electronically Signed   By: Darliss Cheney M.D.   On: 10/05/2023 18:24   CT Cervical Spine Wo Contrast Result Date: 10/05/2023 CLINICAL DATA:  Slipped and fell, ataxia, cervical trauma EXAM: CT CERVICAL SPINE WITHOUT CONTRAST TECHNIQUE: Multidetector CT imaging of the cervical spine was performed without intravenous contrast. Multiplanar CT image reconstructions were also  generated. RADIATION DOSE REDUCTION: This exam was performed according to the departmental dose-optimization program which includes automated exposure control, adjustment of the mA and/or kV according to patient size and/or use of iterative reconstruction technique. COMPARISON:  07/05/2021 FINDINGS: Alignment: Normal. Skull base and vertebrae: No acute fracture. No primary bone lesion or focal pathologic process. Soft tissues and spinal canal: No prevertebral fluid or swelling. No visible canal hematoma. Disc levels: Mild spondylosis at C4-5, C5-6, and C6-7. No significant bony encroachment upon the central canal or neural foramina. Upper chest: Airway is patent. Visualized portions of the lung apices are clear. Other: Reconstructed images demonstrate no additional findings. IMPRESSION: 1. No acute cervical spine fracture. Electronically Signed   By: Sharlet Salina M.D.   On: 10/05/2023 18:20   DG FEMUR MIN 2 VIEWS LEFT Result Date: 10/05/2023 CLINICAL DATA:  Left hip pain after fall, prior left hip fracture status post ORIF EXAM: LEFT FEMUR 2 VIEWS COMPARISON:  04/15/2023 FINDINGS: Frontal and cross-table lateral views of the left femur are obtained. Intramedullary rod with proximal dynamic screws traverse a prior healed intertrochanteric left hip fracture. Please refer to left hip study describing subtle lucencies through the left superior and inferior pubic rami suspicious for nondisplaced fractures. No evidence of acute femoral fracture. Mild osteoarthritis of the left hip and knee. Prominent soft tissue swelling overlying the lateral aspect of the left hip consistent with contusion. IMPRESSION: 1. Equivocal findings for nondisplaced left superior and inferior pubic rami fractures. CT recommended. Please see left hip exam for complete characterization. 2. Prior ORIF of a healed intertrochanteric left hip fracture. Otherwise unremarkable left femur. 3. Soft tissue swelling overlying the lateral aspect left  hip, consistent with contusion. Electronically Signed   By: Sharlet Salina M.D.   On: 10/05/2023 16:42   DG Chest Portable 1 View Result Date: 10/05/2023 CLINICAL DATA:  Slipped and fell, cough EXAM: PORTABLE CHEST 1 VIEW COMPARISON:  07/05/2021 FINDINGS: Single frontal view of the chest demonstrates stable enlargement of the cardiac silhouette. Lung volumes are diminished, with chronic the pulmonary vasculature. No airspace disease, effusion, or pneumothorax. No acute displaced fracture. Prior midthoracic vertebral augmentation. Diffuse atherosclerosis. IMPRESSION: 1. Low lung volumes.  No acute airspace disease. Electronically Signed  By: Sharlet Salina M.D.   On: 10/05/2023 16:41   DG Hip Unilat With Pelvis 2-3 Views Left Result Date: 10/05/2023 CLINICAL DATA:  Slipped and fell, left hip pain, inability to bear weight EXAM: DG HIP (WITH OR WITHOUT PELVIS) 2-3V LEFT COMPARISON:  04/15/2023 FINDINGS: Frontal view of the pelvis as well as frontal and cross-table lateral views of the left hip are obtained. The proximal extent of an intramedullary rod and 2 dynamic screws are seen traversing the left hip, without significant change since prior study. Heterotopic ossification adjacent to the left lesser trochanter. Prior healed left inferior pubic ramus fracture. There are subtle lucencies of the left superior pubic ramus and left inferior ramus extending to the ischium, best seen on the frontal view of the left hip. These are likely due to overlying skin folds are superimposed shadows, though minimally displaced fractures cannot be excluded in light of trauma and inability to bear weight. Further evaluation with CT may be useful. Stable mild bilateral hip osteoarthritis. Marked subcutaneous edema overlying the lateral aspect of the left hip. IMPRESSION: 1. Equivocal findings for nondisplaced fractures through the left superior ramus and left inferior ramus/ischium. CT may be useful for further evaluation. 2.  Extensive subcutaneous edema over the lateral aspect of the left hip, compatible with contusion after fall. 3. Stable ORIF of a prior healed intertrochanteric left hip fracture. 4. Stable bilateral hip osteoarthritis. Electronically Signed   By: Sharlet Salina M.D.   On: 10/05/2023 16:40    Scheduled Meds:  aspirin EC  325 mg Oral Q breakfast   atorvastatin  80 mg Oral Daily   enoxaparin (LOVENOX) injection  40 mg Subcutaneous Q24H   levothyroxine  50 mcg Oral Q0600   Continuous Infusions:  cefTRIAXone (ROCEPHIN)  IV       LOS: 0 days   Time spent: 40 minutes  Carollee Herter, DO  [facility]  10/06/2023, 11:42 AM

## 2023-10-06 NOTE — Evaluation (Signed)
 Occupational Therapy Evaluation Patient Details Name: Natasha Harper MRN: [id number] DOB: June 14, 1935 Today's Date: 10/06/2023   History of Present Illness   Natasha Harper is a 89 y.o. female presents with L hip pain after a fall from standing. CT chest/abdomen/pelvis with contrast was negative for acute intrathoracic pathology; Progression of T5 compression fracture since prior CT; Bilateral superior and inferior pubic rami fractures. PMH:  CAD s/p PCI, HTN, HLD, hypothyroidism, Hx of left hip fracture s/p ORIF 09/2021     Clinical Impressions PTA, patient lives with husband and used RW for mobility and had some assistance from husband for higher level mobility, IADL's and transportation leaving home very sporadically. Patient currently presents with HOH, decreased higher level processing, problem solving and judgement, significant pain, fear of pain with s/s of anxiety, O2 dependent, poor activity tolerance, weakness and mobility and ADL deficits outlined below. Patient will require continued Acute care level hospital based OT to progress BADL's and mobility. Patient will benefit from continued inpatient follow up therapy, <3 hours/day.       If plan is discharge home, recommend the following:   Two people to help with walking and/or transfers;Two people to help with bathing/dressing/bathroom;Assistance with cooking/housework;Assistance with feeding;Direct supervision/assist for medications management;Direct supervision/assist for financial management;Assist for transportation;Help with stairs or ramp for entrance;Supervision due to cognitive status     Functional Status Assessment   Patient has had a recent decline in their functional status and demonstrates the ability to make significant improvements in function in a reasonable and predictable amount of time.     Equipment Recommendations   None recommended by OT      Precautions/Restrictions   Precautions Precautions:  Fall Restrictions Weight Bearing Restrictions Per Provider Order: No     Mobility Bed Mobility Overal bed mobility: Needs Assistance Bed Mobility: Rolling Rolling: Total assist         General bed mobility comments: total A to roll R/L with constant verbal cues for hand placement, LE assistance; attempting to mobilzie to EOB but stopped due to pain, pt LLE assisted ~4 inches towards EOB but pain too high; +2 to scoot up in bed    Transfers                   General transfer comment: unable      Balance Overall balance assessment: History of Falls                                         ADL either performed or assessed with clinical judgement   ADL Overall ADL's : Needs assistance/impaired Eating/Feeding: Set up;Bed level   Grooming: Wash/dry hands;Oral care;Brushing hair;Minimal assistance;Bed level   Upper Body Bathing: Minimal assistance;Bed level   Lower Body Bathing: Total assistance;+2 for physical assistance;+2 for safety/equipment;Bed level   Upper Body Dressing : Maximal assistance;Bed level (all weightbearing over hips and pelvis causes severe pain)   Lower Body Dressing: Total assistance;+2 for physical assistance;+2 for safety/equipment;Bed level   Toilet Transfer:  (unable to tolerate mobility this session and is using a Purewick for urine management and will require a bed pan until able to transfer OOB to commode)           Functional mobility during ADLs: +2 for safety/equipment;+2 for physical assistance General ADL Comments: see above as all bed adjustment or weight shifting forward over hips and pelvis even bed  level caused fear of pain and pain     Vision Baseline Vision/History: 0 No visual deficits (had corrective eye surgery) Ability to See in Adequate Light: 0 Adequate Patient Visual Report: No change from baseline Vision Assessment?: No apparent visual deficits     Perception Perception: Within Functional  Limits       Praxis Praxis: WFL       Pertinent Vitals/Pain Pain Assessment Pain Assessment: 0-10 Pain Score: 7  Pain Location: L hip Pain Descriptors / Indicators: Grimacing, Guarding Pain Intervention(s): Limited activity within patient's tolerance, Monitored during session, Repositioned, Relaxation     Extremity/Trunk Assessment Upper Extremity Assessment Upper Extremity Assessment: Generalized weakness;Right hand dominant   Lower Extremity Assessment Lower Extremity Assessment: RLE deficits/detail;LLE deficits/detail RLE Deficits / Details: ankle AROM WFL, active quad set, AAROM for heel slide and hip abduction/adduction due to pain complaints, reports good sensation LLE Deficits / Details: ankle AROM WFL, active quad set, PROM for heel slide and hip abduction/adduction in limited ROM due to pain complaints, reports good sensation       Communication Communication Communication: Impaired Factors Affecting Communication: Hearing impaired (HOH)   Cognition Arousal: Alert Behavior During Therapy: Anxious (low frustration tolerance) Cognition: Cognition impaired           Executive functioning impairment (select all impairments): Reasoning, Problem solving OT - Cognition Comments: pain and fear of pain causing s/s of anxiety and higher level problem solving and reasoning deficits with low frustration tolerance                 Following commands: Impaired Following commands impaired: Follows one step commands inconsistently, Follows one step commands with increased time     Cueing  General Comments   Cueing Techniques: Verbal cues;Gestural cues;Tactile cues;Visual cues  Patient on 4 ltrs of O2 via [place] and when taken off onto RA dropped to 84% then placed back on with recovery to 92% or > SpO2   Exercises General Exercises - Lower Extremity Hip ABduction/ADduction:  (AROM RLE, PROM LL)   Shoulder Instructions      Home Living Family/patient expects to be  discharged to:: Private residence Living Arrangements: Spouse/significant other Available Help at Discharge: Family;Available 24 hours/day Type of Home: House Home Access: Stairs to enter [employer] of Steps: 2   Home Layout: Two level;Able to live on main level with bedroom/bathroom     Bathroom Shower/Tub: Tub/shower unit;Walk-in shower   Bathroom Toilet: Standard     Home Equipment: Agricultural consultant (2 wheels);Shower seat - built in;Grab bars - toilet;Grab bars - tub/shower;Transport chair   Additional Comments: Husband present to provide input but patient became frustrated easily when husband participated in assisting with history and patient requested he step out of room      Prior Functioning/Environment Prior Level of Function : Needs assist       Physical Assist : Mobility (physical);ADLs (physical) Mobility (physical): Transfers;Gait ADLs (physical): Bathing;Dressing;IADLs Mobility Comments: pt using RW for limited home distances, spouse reports pt doesn't leave the home ADLs Comments: pt completing sponge baths per spouse, ind with dressing and bathing, spouse completes all household chores    OT Problem List: Decreased strength;Decreased activity tolerance;Impaired balance (sitting and/or standing);Decreased cognition;Decreased safety awareness;Decreased knowledge of use of DME or AE;Decreased knowledge of precautions;Cardiopulmonary status limiting activity;Pain   OT Treatment/Interventions: Self-care/ADL training;Therapeutic exercise;Neuromuscular education;Energy conservation;DME and/or AE instruction;Therapeutic activities;Cognitive remediation/compensation;Patient/family education;Balance training      OT Goals(Current goals can be found in the care plan section)  Acute Rehab OT Goals Patient Stated Goal: to go home OT Goal Formulation: With patient/family Time For Goal Achievement: 10/20/23 Potential to Achieve Goals: Good ADL Goals Pt Will  Perform Grooming: sitting;with contact guard assist Pt Will Perform Upper Body Bathing: with contact guard assist;sitting Pt Will Perform Upper Body Dressing: with contact guard assist;sitting Pt Will Transfer to Toilet: with +2 assist;with transfer board;bedside commode;stand pivot transfer;with mod assist   OT Frequency:  Min 2X/week    Co-evaluation   Reason for Co-Treatment: Necessary to address cognition/behavior during functional activity;For patient/therapist safety;To address functional/ADL transfers PT goals addressed during session: Mobility/safety with mobility        AM-PAC OT "6 Clicks" Daily Activity     Outcome Measure Help from another person eating meals?: A Little Help from another person taking care of personal grooming?: A Little Help from another person toileting, which includes using toliet, bedpan, or urinal?: Total Help from another person bathing (including washing, rinsing, drying)?: Total Help from another person to put on and taking off regular upper body clothing?: A Lot Help from another person to put on and taking off regular lower body clothing?: Total 6 Click Score: 11   End of Session Equipment Utilized During Treatment: Oxygen Nurse Communication: Mobility status;Patient requests pain meds;Weight bearing status;Precautions (SpO2 status and outcome of trial mobility)  Activity Tolerance: Patient limited by fatigue;Patient limited by pain Patient left: in bed;with call bell/phone within reach;with bed alarm set;with family/visitor present  OT Visit Diagnosis: Unsteadiness on feet (R26.81);Other abnormalities of gait and mobility (R26.89);Muscle weakness (generalized) (M62.81);History of falling (Z91.81);Pain Pain - Right/Left: Right Pain - part of body: Hip                Time: 1610-9604 OT Time Calculation (min): 40 min Charges:  OT General Charges $OT Visit: 1 Visit OT Evaluation $OT Eval Moderate Complexity: 1 Mod OT Treatments $Self  Care/Home Management : 23-37 mins  Marty Uy OT/L Acute Rehabilitation Department  [phone]  10/06/2023, 1:58 PM

## 2023-10-06 NOTE — Assessment & Plan Note (Signed)
 10-06-2023 remains on IV Rocephin. Awaiting urine cx.

## 2023-10-06 NOTE — NC FL2 (Signed)
 [place] MEDICAID FL2 LEVEL OF CARE FORM     IDENTIFICATION  Patient Name: Natasha Harper Birthdate: 11/02/1934 Sex: female Admission Date (Current Location): 10/05/2023  [facility] and [place] Number:  Producer, television/film/video and Address:  [facility],  [address] [place], [place] [address]      Provider Number: [id number]  Attending Physician Name and Address:  Carollee Herter, DO  Relative Name and Phone Number:  Chamya Hunton [phone]    Current Level of Care: Hospital Recommended Level of Care: Skilled Nursing Facility Prior Approval Number:    Date Approved/Denied:   PASRR Number: [id number]  Discharge Plan: SNF    Current Diagnoses: Patient Active Problem List   Diagnosis Date Noted   Acute cystitis without hematuria 10/05/2023   Hypoxia 10/05/2023   Bilateral pubic rami fractures, closed, initial encounter (HCC) 10/05/2023   Fall at home, initial encounter 09/05/2021   Primary hypertension    Thoracic compression fracture (HCC) 07/05/2021   Vertebral fracture, osteoporotic (HCC) 04/15/2021   Anemia 04/14/2021   Compression fracture of thoracic vertebra (HCC) 02/06/2021   Compression of lumbar vertebra (HCC) 02/06/2021   Back pain 02/05/2021   Decreased hearing 10/05/2013   CAD (coronary artery disease)    Dyslipidemia    Carotid arterial disease (HCC)    Hypothyroidism    Disorder of bone and cartilage 04/07/2008   HYPERTENSION, WHITE COAT 04/07/2008   HYPERGLYCEMIA, FASTING 07/21/2007   THYROID NODULE 04/05/2007    Orientation RESPIRATION BLADDER Height & Weight     Self, Time, Situation, Place  Normal External catheter Weight: 64.2 kg Height:  5\' 1"  (154.9 cm)  BEHAVIORAL SYMPTOMS/MOOD NEUROLOGICAL BOWEL NUTRITION STATUS      Continent Diet  AMBULATORY STATUS COMMUNICATION OF NEEDS Skin   Extensive Assist Verbally Normal                       Personal Care Assistance Level of Assistance  Dressing, Bathing Bathing Assistance:  Limited assistance   Dressing Assistance: Limited assistance     Functional Limitations Info  Hearing   Hearing Info: Adequate (with hearing aids)      SPECIAL CARE FACTORS FREQUENCY  PT (By licensed PT), OT (By licensed OT)     PT Frequency: 5X weekly OT Frequency: 5x weekly            Contractures Contractures Info: Not present    Additional Factors Info  Code Status, Allergies Code Status Info: FULL Allergies Info: Actonel (Risedronate Sodium), Evista (Raloxifene), Fosamax (Alendronate Sodium)           Current Medications (10/06/2023):  This is the current hospital active medication list Current Facility-Administered Medications  Medication Dose Route Frequency Provider Last Rate Last Admin   acetaminophen (TYLENOL) tablet 650 mg  650 mg Oral Q6H PRN Charlsie Quest, MD       Or   acetaminophen (TYLENOL) suppository 650 mg  650 mg Rectal Q6H PRN Darreld Mclean R, MD       albuterol (PROVENTIL) (2.5 MG/3ML) 0.083% nebulizer solution 2.5 mg  2.5 mg Nebulization Q6H PRN Charlsie Quest, MD       aspirin EC tablet 325 mg  325 mg Oral Q breakfast Darreld Mclean R, MD   325 mg at 10/06/23 0900   atorvastatin (LIPITOR) tablet 80 mg  80 mg Oral Daily Darreld Mclean R, MD   80 mg at 10/06/23 0900   bisacodyl (DULCOLAX) EC tablet 5 mg  5 mg  Oral Daily PRN Charlsie Quest, MD       cefTRIAXone (ROCEPHIN) 1 g in sodium chloride 0.9 % 100 mL IVPB  1 g Intravenous Q24H Patel, Vishal R, MD       enoxaparin (LOVENOX) injection 40 mg  40 mg Subcutaneous Q24H Charlsie Quest, MD       HYDROcodone-acetaminophen (NORCO/VICODIN) 5-325 MG per tablet 1-2 tablet  1-2 tablet Oral Q4H PRN Charlsie Quest, MD   1 tablet at 10/05/23 2254   levothyroxine (SYNTHROID) tablet 50 mcg  50 mcg Oral Q0600 Charlsie Quest, MD   50 mcg at 10/06/23 0505   ondansetron (ZOFRAN) tablet 4 mg  4 mg Oral Q6H PRN Charlsie Quest, MD       Or   ondansetron (ZOFRAN) injection 4 mg  4 mg Intravenous Q6H PRN Charlsie Quest, MD       senna-docusate (Senokot-S) tablet 1 tablet  1 tablet Oral QHS PRN Charlsie Quest, MD         Discharge Medications: Please see discharge summary for a list of discharge medications.  Relevant Imaging Results:  Relevant Lab Results:   Additional Information SS# [id number]  Jessie Foot, RN

## 2023-10-06 NOTE — Assessment & Plan Note (Signed)
 10-06-2023 doubt PE. Likely due to pain meds. Wean to RA. Not on home O2.

## 2023-10-06 NOTE — Assessment & Plan Note (Signed)
 10-06-2023 husband states pt not taking any BP meds at home. Monitor BP.

## 2023-10-06 NOTE — Evaluation (Signed)
 Physical Therapy Evaluation Patient Details Name: Natasha Harper MRN: [id number] DOB: [birth year] Today's Date: 10/06/2023  History of Present Illness  Natasha Harper is a 89 y.o. female presents with L hip pain after a fall from standing. CT chest/abdomen/pelvis with contrast was negative for acute intrathoracic pathology; Progression of T5 compression fracture since prior CT; Bilateral superior and inferior pubic rami fractures. PMH:  CAD s/p PCI, HTN, HLD, hypothyroidism, Hx of left hip fracture s/p ORIF 09/2021  Clinical Impression  Pt admitted with above diagnosis. Pt and spouse reports pt amb with RW in the home and doesn't leave home, completes self care tasks while spouse completes household chores and driving. On eval, pt significantly limited by pain, assisted LLE towards EOB ~4 inches in significantly increased time and then unable to continue so +2 to scoot pt up in bed and roll R/L for linen. Pt reports 7/10 pain with muscle activation, PROM in limited ROM with significant cues for relaxation. Pt on 4L O2 with SpO2 97%, placed on RA due to baseline and pt desat to 84% so returned 4L and SpO2 >92%- RN notified. Patient will benefit from continued inpatient follow up therapy, <3 hours/day. Pt currently with functional limitations due to the deficits listed below (see PT Problem List). Pt will benefit from acute skilled PT to increase their independence and safety with mobility to allow discharge.           If plan is discharge home, recommend the following: Two people to help with walking and/or transfers;Two people to help with bathing/dressing/bathroom;Assistance with cooking/housework;Assist for transportation;Help with stairs or ramp for entrance   Can travel by private vehicle   No    Equipment Recommendations None recommended by PT  Recommendations for Other Services       Functional Status Assessment Patient has had a recent decline in their functional status and demonstrates  the ability to make significant improvements in function in a reasonable and predictable amount of time.     Precautions / Restrictions Precautions Precautions: Fall Restrictions Weight Bearing Restrictions Per Provider Order: No      Mobility  Bed Mobility Overal bed mobility: Needs Assistance Bed Mobility: Rolling Rolling: Total assist         General bed mobility comments: total A to roll R/L with constant verbal cues for hand placement, LE assistance; attempting to mobilzie to EOB but stopped due to pain, pt LLE assisted ~4 inches towards EOB but pain too high; +2 to scoot up in bed    Transfers                   General transfer comment: unable    Ambulation/Gait                  Stairs            Wheelchair Mobility     Tilt Bed    Modified Rankin (Stroke Patients Only)       Balance Overall balance assessment: History of Falls                                           Pertinent Vitals/Pain Pain Assessment Pain Assessment: 0-10 Pain Score: 7  Pain Location: L hip Pain Descriptors / Indicators: Grimacing, Guarding Pain Intervention(s): Limited activity within patient's tolerance, Monitored during session, Repositioned, Premedicated before session    Home  Living Family/patient expects to be discharged to:: Private residence Living Arrangements: Spouse/significant other Available Help at Discharge: Family;Available 24 hours/day Type of Home: House Home Access: Stairs to enter   [employer] of Steps: 2   Home Layout: Two level;Able to live on main level with bedroom/bathroom Home Equipment: Rolling Walker (2 wheels);Shower seat - built in;Grab bars - toilet;Grab bars - tub/shower;Transport chair      Prior Function Prior Level of Function : Needs assist             Mobility Comments: pt using RW for limited home distances, spouse reports pt doesn't leave the home ADLs Comments: pt completing  sponge baths per spouse, ind with dressing and bathing, spouse completes all household chores     Extremity/Trunk Assessment   Upper Extremity Assessment Upper Extremity Assessment: Defer to OT evaluation    Lower Extremity Assessment Lower Extremity Assessment: RLE deficits/detail;LLE deficits/detail RLE Deficits / Details: ankle AROM WFL, active quad set, AAROM for heel slide and hip abduction/adduction due to pain complaints, reports good sensation LLE Deficits / Details: ankle AROM WFL, active quad set, PROM for heel slide and hip abduction/adduction in limited ROM due to pain complaints, reports good sensation       Communication   Communication Communication: Impaired Factors Affecting Communication: Hearing impaired (HOH)    Cognition Arousal: Alert Behavior During Therapy: Anxious   PT - Cognitive impairments: Difficult to assess                       PT - Cognition Comments: pt needing assist from spouse to recall home setup and functional status Following commands: Impaired Following commands impaired: Follows one step commands inconsistently, Follows one step commands with increased time     Cueing Cueing Techniques: Verbal cues, Gestural cues, Tactile cues, Visual cues     General Comments General comments (skin integrity, edema, etc.): Placed pt on RA and SpO2 84% while supine, returned 4L and SpO2 >92%- RN notified    Exercises General Exercises - Lower Extremity Ankle Circles/Pumps: AROM, Both, 10 reps Heel Slides: 5 reps (AROM RLE, PROM LLE) Hip ABduction/ADduction: 5 reps (AROM RLE, PROM LLE)   Assessment/Plan    PT Assessment Patient needs continued PT services  PT Problem List Decreased strength;Decreased range of motion;Decreased activity tolerance;Decreased balance;Decreased mobility;Decreased cognition;Decreased knowledge of use of DME;Decreased safety awareness;Decreased knowledge of precautions;Cardiopulmonary status limiting  activity;Pain       PT Treatment Interventions DME instruction;Gait training;Functional mobility training;Therapeutic activities;Therapeutic exercise;Balance training;Cognitive remediation;Patient/family education;Modalities    PT Goals (Current goals can be found in the Care Plan section)  Acute Rehab PT Goals Patient Stated Goal: less pain PT Goal Formulation: With patient/family Time For Goal Achievement: 10/20/23 Potential to Achieve Goals: Fair    Frequency Min 2X/week     Co-evaluation PT/OT/SLP Co-Evaluation/Treatment: Yes Reason for Co-Treatment: Necessary to address cognition/behavior during functional activity;For patient/therapist safety;To address functional/ADL transfers PT goals addressed during session: Mobility/safety with mobility         AM-PAC PT "6 Clicks" Mobility  Outcome Measure Help needed turning from your back to your side while in a flat bed without using bedrails?: Total Help needed moving from lying on your back to sitting on the side of a flat bed without using bedrails?: Total Help needed moving to and from a bed to a chair (including a wheelchair)?: Total Help needed standing up from a chair using your arms (e.g., wheelchair or bedside chair)?: Total Help needed  to walk in hospital room?: Total Help needed climbing 3-5 steps with a railing? : Total 6 Click Score: 6    End of Session Equipment Utilized During Treatment: Oxygen Activity Tolerance: Patient limited by pain Patient left: in bed;with call bell/phone within reach;with bed alarm set;with family/visitor present Nurse Communication: Mobility status;Other (comment) (SpO2, pain) PT Visit Diagnosis: Other abnormalities of gait and mobility (R26.89);Muscle weakness (generalized) (M62.81);History of falling (Z91.81);Pain Pain - Right/Left: Left Pain - part of body: Hip    Time: 1139-1220 PT Time Calculation (min) (ACUTE ONLY): 41 min   Charges:   PT Evaluation $PT Eval Moderate  Complexity: 1 Mod   PT General Charges $$ ACUTE PT VISIT: 1 Visit         Tori Athalie Newhard PT, DPT 10/06/23, 1:06 PM

## 2023-10-06 NOTE — Assessment & Plan Note (Signed)
 10-06-2023 continue synthroid

## 2023-10-06 NOTE — Subjective & Objective (Signed)
 Pt seen and examined. Met with pt and her husband at bedside. Pt states she slipped at home on slippery part of her wooden floor. Fell directly onto her buttocks. Has buttock pain.  CT/XR shows bilateral pubic rami fracture. This is a non-operative fracture and is WBAT. Discussed this with ortho. No operation needed.  Pt needs PT/OT evaluation.  Pt has been to [facility] in the past for rehab after femur fracture.  Pt being treated with IV ABX for potential UTI.

## 2023-10-07 DIAGNOSIS — S32591A Other specified fracture of right pubis, initial encounter for closed fracture: Secondary | ICD-10-CM | POA: Diagnosis not present

## 2023-10-07 DIAGNOSIS — S32592A Other specified fracture of left pubis, initial encounter for closed fracture: Secondary | ICD-10-CM | POA: Diagnosis not present

## 2023-10-07 LAB — URINALYSIS, ROUTINE W REFLEX MICROSCOPIC
Bacteria, UA: NONE SEEN
Bilirubin Urine: NEGATIVE
Glucose, UA: NEGATIVE mg/dL
Hgb urine dipstick: NEGATIVE
Ketones, ur: 20 mg/dL — AB
Leukocytes,Ua: NEGATIVE
Nitrite: NEGATIVE
Protein, ur: 30 mg/dL — AB
Specific Gravity, Urine: 1.025 (ref 1.005–1.030)
pH: 5 (ref 5.0–8.0)

## 2023-10-07 MED ORDER — LIDOCAINE 5 % EX PTCH
1.0000 | MEDICATED_PATCH | CUTANEOUS | Status: DC
Start: 2023-10-07 — End: 2023-10-13
  Administered 2023-10-07 – 2023-10-12 (×6): 1 via TRANSDERMAL
  Filled 2023-10-07 (×6): qty 1

## 2023-10-07 MED ORDER — METOPROLOL TARTRATE 25 MG PO TABS
25.0000 mg | ORAL_TABLET | Freq: Two times a day (BID) | ORAL | Status: DC
  Administered 2023-10-07 – 2023-10-13 (×13): 25 mg via ORAL
  Filled 2023-10-07 (×13): qty 1

## 2023-10-07 MED ORDER — OXYCODONE HCL 5 MG PO TABS
5.0000 mg | ORAL_TABLET | ORAL | Status: DC | PRN
  Administered 2023-10-08: 5 mg via ORAL
  Filled 2023-10-07: qty 1

## 2023-10-07 MED ORDER — HYDRALAZINE HCL 20 MG/ML IJ SOLN
5.0000 mg | INTRAMUSCULAR | Status: DC | PRN
  Administered 2023-10-08 – 2023-10-11 (×2): 5 mg via INTRAVENOUS
  Filled 2023-10-07 (×2): qty 1

## 2023-10-07 MED ORDER — SODIUM CHLORIDE 0.9 % IV SOLN: INTRAVENOUS | Status: DC

## 2023-10-07 MED ORDER — ACETAMINOPHEN 500 MG PO TABS
1000.0000 mg | ORAL_TABLET | Freq: Three times a day (TID) | ORAL | Status: DC
  Administered 2023-10-07 – 2023-10-13 (×18): 1000 mg via ORAL
  Filled 2023-10-07 (×18): qty 2

## 2023-10-07 NOTE — Plan of Care (Signed)

## 2023-10-07 NOTE — Progress Notes (Signed)
 PROGRESS NOTE    Natasha Harper  XBJ:[id number] DOB: 02-09-1935 DOA: 10/05/2023 PCP: Natasha Headings, MD    Brief Narrative:  HPI: Natasha Harper is a 89 y.o. female with medical history significant for CAD s/p PCI, HTN, HLD, hypothyroidism, Hx of left hip fracture s/p ORIF 09/2021 who presented to the ED for evaluation of left hip pain after fall at home.   Patient states that she was getting up from her recliner earlier today when she slipped and fell onto her buttocks.  She had subsequent left hip/groin pain and was unable to stand or ambulate on her home.  She denied any head injury or loss of consciousness.  She denied associated lightheadedness, dizziness, chest pain, dyspnea.   Husband states that her ambulatory status has been diminished over the last 2 years.     Significant Imaging Studies: Left hip XR Equivocal findings for nondisplaced fractures through the left superior ramus and left inferior ramus/ischium. CT may be useful for further evaluation. 2. Extensive subcutaneous edema over the lateral aspect of the left hip, compatible with contusion after fall.  3. Stable ORIF of a prior healed intertrochanteric left hip fracture. 4. Stable bilateral hip osteoarthritis. CXR Low lung volumes. No acute airspace disease.  Left femur XR Equivocal findings for nondisplaced left superior and inferior pubic rami fractures. CT recommended. Please see left hip exam for complete characterization. 2. Prior ORIF of a healed intertrochanteric left hip fracture. Otherwise unremarkable left femur.  3. Soft tissue swelling overlying the lateral aspect left hip, consistent with contusion. CT head No acute intracranial process. 2. Stable atrophy and chronic small vessel ischemic changes. CT c-spine No acute cervical spine fracture.  CT chest/abd/pelvis No acute intrathoracic pathology. 2. Progression of T5 compression fracture since the prior CT. Correlation with clinical exam and point tenderness  recommended. 3. Bilateral superior and inferior pubic rami fractures. 4. Sigmoid diverticulosis. No bowel obstruction. Normal appendix. 5. Bilateral inguinal hernias containing short segments of small bowel, left greater than right. No obstruction. 6. Multiple bilateral breast nodules.    Assessment and Plan: * Bilateral pubic rami fractures, closed, initial encounter (HCC) -PT/OT-SNF - Non-operative fracture.  -Will need outpatient referral to [facility] after discharge for f/u. -pain control-- added lidocaine patch and scheduled tylenol  Hypoxia -doubt PE. Likely due to pain meds. Wean to RA. Not on home O2. -incentive spirometry  UTI vs pna - IV Rocephin. -culture NGTD  Fall at home, initial encounter -pt does not leave the home per pt and her husband. Does not use walker or cane at home.   Primary hypertension - husband states pt not taking any BP meds at home. -add prn meds  Hypothyroidism -continue synthroid  Dyslipidemia -continue lipitor.  CAD (coronary artery disease) - stable.on ASA and lipitor.          DVT prophylaxis: enoxaparin (LOVENOX) injection 40 mg Start: 10/06/23 2200    Code Status: Full Code Family Communication: at bedside  Disposition Plan:  Level of care: Med-Surg Status is: Inpatient     Consultants:    Subjective: Pain in lower back with  movement  Objective: Vitals:   10/06/23 2030 10/07/23 0456 10/07/23 0800 10/07/23 1006  BP: (!) 161/70 (!) 216/85 (!) 182/82 (!) 168/98  Pulse: 97 (!) 107 100 94  Resp: 17 18    Temp: 98 F (36.7 C) 98 F (36.7 C) 98.2 F (36.8 C)   TempSrc:  Axillary Oral   SpO2: 91% 96% 98% 99%  Weight:      Height:        Intake/Output Summary (Last 24 hours) at 10/07/2023 1155 Last data filed at 10/07/2023 0800 Gross per 24 hour  Intake 340 ml  Output 150 ml  Net 190 ml   Filed Weights   10/05/23 2015  Weight: 64.2 kg    Examination:   General: Appearance:     Overweight female  in no acute distress     Lungs:     respirations unlabored, on [place]  Heart:    Normal heart rate.    MS:   All extremities are intact.    Neurologic:   Awake, alert       Data Reviewed: I have personally reviewed following labs and imaging studies  CBC: Recent Labs  Lab 10/05/23 1626 10/06/23 0432  WBC 7.8 5.6  NEUTROABS 6.9  --   HGB 11.3* 9.4*  HCT 35.5* 29.4*  MCV 108.6* 109.7*  PLT 212 160   Basic Metabolic Panel: Recent Labs  Lab 10/05/23 1626 10/06/23 0432  NA 134* 134*  K 4.1 3.9  CL 102 105  CO2 23 22  GLUCOSE 110* 103*  BUN 22 22  CREATININE 0.44 <0.30*  CALCIUM 9.0 8.3*   GFR: CrCl cannot be calculated (This lab value cannot be used to calculate CrCl because it is not a number: <0.30). Liver Function Tests: Recent Labs  Lab 10/05/23 1626  AST 31  ALT 37  ALKPHOS 89  BILITOT 0.4  PROT 6.5  ALBUMIN 3.6   No results for input(s): "LIPASE", "AMYLASE" in the last 168 hours. No results for input(s): "AMMONIA" in the last 168 hours. Coagulation Profile: Recent Labs  Lab 10/05/23 1626  INR 1.2   Cardiac Enzymes: No results for input(s): "CKTOTAL", "CKMB", "CKMBINDEX", "TROPONINI" in the last 168 hours. BNP (last 3 results) No results for input(s): "PROBNP" in the last 8760 hours. HbA1C: No results for input(s): "HGBA1C" in the last 72 hours. CBG: No results for input(s): "GLUCAP" in the last 168 hours. Lipid Profile: No results for input(s): "CHOL", "HDL", "LDLCALC", "TRIG", "CHOLHDL", "LDLDIRECT" in the last 72 hours. Thyroid Function Tests: No results for input(s): "TSH", "T4TOTAL", "FREET4", "T3FREE", "THYROIDAB" in the last 72 hours. Anemia Panel: No results for input(s): "VITAMINB12", "FOLATE", "FERRITIN", "TIBC", "IRON", "RETICCTPCT" in the last 72 hours. Sepsis Labs: Recent Labs  Lab 10/05/23 1654  LATICACIDVEN 1.1    Recent Results (from the past 240 hours)  Resp panel by RT-PCR (RSV, Flu A&B, Covid) Anterior Nasal Swab      Status: None   Collection Time: 10/05/23  4:26 PM   Specimen: Anterior Nasal Swab  Result Value Ref Range Status   SARS Coronavirus 2 by RT PCR NEGATIVE NEGATIVE Final    Comment: (NOTE) SARS-CoV-2 target nucleic acids are NOT DETECTED.  The SARS-CoV-2 RNA is generally detectable in upper respiratory specimens during the acute phase of infection. The lowest concentration of SARS-CoV-2 viral copies this assay can detect is 138 copies/mL. A negative result does not preclude SARS-Cov-2 infection and should not be used as the sole basis for treatment or other patient management decisions. A negative result may occur with  improper specimen collection/handling, submission of specimen other than nasopharyngeal swab, presence of viral mutation(s) within the areas targeted by this assay, and inadequate number of viral copies(<138 copies/mL). A negative result must be combined with clinical observations, patient history, and epidemiological information. The expected result is Negative.  Fact Sheet for Patients:  BloggerCourse.com  Fact Sheet for Healthcare Providers:  SeriousBroker.it  This test is no t yet approved or cleared by the [place] FDA and  has been authorized for detection and/or diagnosis of SARS-CoV-2 by FDA under an Emergency Use Authorization (EUA). This EUA will remain  in effect (meaning this test can be used) for the duration of the COVID-19 declaration under Section 564(b)(1) of the Act, 21 U.S.C.section 360bbb-3(b)(1), unless the authorization is terminated  or revoked sooner.       Influenza A by PCR NEGATIVE NEGATIVE Final   Influenza B by PCR NEGATIVE NEGATIVE Final    Comment: (NOTE) The Xpert Xpress SARS-CoV-2/FLU/RSV plus assay is intended as an aid in the diagnosis of influenza from Nasopharyngeal swab specimens and should not be used as a sole basis for treatment. Nasal washings and aspirates are  unacceptable for Xpert Xpress SARS-CoV-2/FLU/RSV testing.  Fact Sheet for Patients: BloggerCourse.com  Fact Sheet for Healthcare Providers: SeriousBroker.it  This test is not yet approved or cleared by the [place] FDA and has been authorized for detection and/or diagnosis of SARS-CoV-2 by FDA under an Emergency Use Authorization (EUA). This EUA will remain in effect (meaning this test can be used) for the duration of the COVID-19 declaration under Section 564(b)(1) of the Act, 21 U.S.C. section 360bbb-3(b)(1), unless the authorization is terminated or revoked.     Resp Syncytial Virus by PCR NEGATIVE NEGATIVE Final    Comment: (NOTE) Fact Sheet for Patients: BloggerCourse.com  Fact Sheet for Healthcare Providers: SeriousBroker.it  This test is not yet approved or cleared by the [place] FDA and has been authorized for detection and/or diagnosis of SARS-CoV-2 by FDA under an Emergency Use Authorization (EUA). This EUA will remain in effect (meaning this test can be used) for the duration of the COVID-19 declaration under Section 564(b)(1) of the Act, 21 U.S.C. section 360bbb-3(b)(1), unless the authorization is terminated or revoked.  Performed at [facility], [address], [place], [place] [address]   Blood Culture (routine x 2)     Status: None (Preliminary result)   Collection Time: 10/05/23  4:33 PM   Specimen: BLOOD  Result Value Ref Range Status   Specimen Description   Final    BLOOD RIGHT ANTECUBITAL Performed at [facility], [address] [place], [place] [address]    Special Requests   Final    BOTTLES DRAWN AEROBIC AND ANAEROBIC Blood Culture results may not be optimal due to an inadequate volume of blood received in culture bottles Performed at [facility], [address],  [place], [place] [address]    Culture   Final    NO GROWTH 2 DAYS Performed at [facility], [address], [place], [place] [address]    Report Status PENDING  Incomplete  Blood Culture (routine x 2)     Status: None (Preliminary result)   Collection Time: 10/05/23  4:50 PM   Specimen: BLOOD RIGHT HAND  Result Value Ref Range Status   Specimen Description   Final    BLOOD RIGHT HAND Performed at [facility], [address] [place], [place] [address]    Special Requests   Final    BOTTLES DRAWN AEROBIC AND ANAEROBIC Blood Culture results may not be optimal due to an inadequate volume of blood received in culture bottles Performed at [facility], [address] [place], [place] [address]    Culture   Final  NO GROWTH 2 DAYS Performed at [facility], [address] [place], [place] [address]    Report Status PENDING  Incomplete  Urine Culture     Status: Abnormal   Collection Time: 10/05/23  6:11 PM   Specimen: Urine, Clean Catch  Result Value Ref Range Status   Specimen Description   Final    URINE, CLEAN CATCH Performed at [facility], [address] [place], [place] [address]    Special Requests   Final    NONE Reflexed from [id number] Performed at [facility], [address] [place], [place] [address]    Culture MULTIPLE SPECIES PRESENT, SUGGEST RECOLLECTION (A)  Final   Report Status 10/06/2023 FINAL  Final         Radiology Studies: CT CHEST ABDOMEN PELVIS W CONTRAST Result Date: 10/05/2023 CLINICAL DATA:  Sepsis.  Fall and left hip pain. EXAM: CT CHEST, ABDOMEN, AND PELVIS WITH CONTRAST TECHNIQUE: Multidetector CT imaging of the chest, abdomen and pelvis was performed following the standard protocol during bolus administration of intravenous contrast. RADIATION DOSE REDUCTION: This exam was performed according to the departmental dose-optimization program which includes automated exposure control,  adjustment of the mA and/or kV according to patient size and/or use of iterative reconstruction technique. CONTRAST:  OMNIPAQUE IOHEXOL 300 MG/ML  SOLN COMPARISON:  Chest CT dated 07/05/2021. FINDINGS: Evaluation of this exam is limited due to respiratory motion. CT CHEST FINDINGS Cardiovascular: Mild cardiomegaly. No pericardial effusion. There is coronary vascular calcification. Moderate atherosclerotic calcification of the thoracic aorta. No aneurysmal dilatation or dissection. The origins of the great vessels of the aortic arch and the central pulmonary arteries appear patent. Mediastinum/Nodes: No hilar or mediastinal adenopathy. The esophagus is grossly unremarkable. No mediastinal fluid collection Lungs/Pleura: Bibasilar subpleural atelectasis. There is no pleural effusion or pneumothorax. The central airways are patent. Musculoskeletal: Osteopenia with degenerative changes of the spine. Old healed sternal fractures. Multilevel chronic appearing thoracic compression deformities. T6 vertebroplasty. Progression of T5 compression fracture since the prior CT. Correlation with clinical exam and point tenderness recommended. Evaluation for fracture is very limited due to severe osteoporosis. Multiple bilateral breast nodules. Correlation with clinical exam and mammographic studies as clinically indicated. CT ABDOMEN PELVIS FINDINGS No intra-abdominal free air or free fluid. Hepatobiliary: Small liver cyst. Mild biliary dilatation. The gallbladder is unremarkable Pancreas: Unremarkable. No pancreatic ductal dilatation or surrounding inflammatory changes. Spleen: Normal in size without focal abnormality. Adrenals/Urinary Tract: The adrenal glands are unremarkable. There is no hydronephrosis on either side. There is symmetric enhancement and excretion of contrast by both kidneys. Small bilateral renal cysts. The visualized ureters appear unremarkable. The urinary bladder is grossly unremarkable. Stomach/Bowel:  There is sigmoid diverticulosis with muscular hypertrophy. There is no bowel obstruction or active inflammation. The appendix is normal. Vascular/Lymphatic: Advanced aortoiliac atherosclerotic disease. The IVC is unremarkable. No portal venous gas. There is no adenopathy. Reproductive: The uterus is grossly unremarkable. No suspicious adnexal masses. Other: Bilateral inguinal hernias containing short segments of small bowel, left greater than right. No obstruction. Musculoskeletal: Status post prior ORIF of the left femoral neck. Minimally displaced fracture of the right superior pubic ramus and probable nondisplaced fracture of the right inferior pubic ramus. There is a nondisplaced fracture of the left pubic bone extending from the ischial tuberosity into the left inferior pubic ramus. Minimally displaced fracture of the left superior pubic ramus. There is small hematoma in the anterior pelvis. IMPRESSION: 1. No acute intrathoracic pathology. 2.  Progression of T5 compression fracture since the prior CT. Correlation with clinical exam and point tenderness recommended. 3. Bilateral superior and inferior pubic rami fractures. 4. Sigmoid diverticulosis. No bowel obstruction. Normal appendix. 5. Bilateral inguinal hernias containing short segments of small bowel, left greater than right. No obstruction. 6. Multiple bilateral breast nodules. Electronically Signed   By: Elgie Collard M.D.   On: 10/05/2023 18:26   CT Head Wo Contrast Result Date: 10/05/2023 CLINICAL DATA:  Ataxia, head trauma. EXAM: CT HEAD WITHOUT CONTRAST TECHNIQUE: Contiguous axial images were obtained from the base of the skull through the vertex without intravenous contrast. RADIATION DOSE REDUCTION: This exam was performed according to the departmental dose-optimization program which includes automated exposure control, adjustment of the mA and/or kV according to patient size and/or use of iterative reconstruction technique. COMPARISON:  Head  CT 07/05/2021 FINDINGS: Brain: No evidence of acute infarction, hemorrhage, hydrocephalus, extra-axial collection or mass lesion/mass effect. Again seen is mild diffuse atrophy and moderate periventricular white matter hypodensity, likely chronic small vessel ischemic change. Vascular: Atherosclerotic calcifications are present within the cavernous internal carotid arteries. Skull: Normal. Negative for fracture or focal lesion. Sinuses/Orbits: No acute finding. Other: None. IMPRESSION: 1. No acute intracranial process. 2. Stable atrophy and chronic small vessel ischemic changes. Electronically Signed   By: Darliss Cheney M.D.   On: 10/05/2023 18:24   CT Cervical Spine Wo Contrast Result Date: 10/05/2023 CLINICAL DATA:  Slipped and fell, ataxia, cervical trauma EXAM: CT CERVICAL SPINE WITHOUT CONTRAST TECHNIQUE: Multidetector CT imaging of the cervical spine was performed without intravenous contrast. Multiplanar CT image reconstructions were also generated. RADIATION DOSE REDUCTION: This exam was performed according to the departmental dose-optimization program which includes automated exposure control, adjustment of the mA and/or kV according to patient size and/or use of iterative reconstruction technique. COMPARISON:  07/05/2021 FINDINGS: Alignment: Normal. Skull base and vertebrae: No acute fracture. No primary bone lesion or focal pathologic process. Soft tissues and spinal canal: No prevertebral fluid or swelling. No visible canal hematoma. Disc levels: Mild spondylosis at C4-5, C5-6, and C6-7. No significant bony encroachment upon the central canal or neural foramina. Upper chest: Airway is patent. Visualized portions of the lung apices are clear. Other: Reconstructed images demonstrate no additional findings. IMPRESSION: 1. No acute cervical spine fracture. Electronically Signed   By: Sharlet Salina M.D.   On: 10/05/2023 18:20   DG FEMUR MIN 2 VIEWS LEFT Result Date: 10/05/2023 CLINICAL DATA:  Left hip  pain after fall, prior left hip fracture status post ORIF EXAM: LEFT FEMUR 2 VIEWS COMPARISON:  04/15/2023 FINDINGS: Frontal and cross-table lateral views of the left femur are obtained. Intramedullary rod with proximal dynamic screws traverse a prior healed intertrochanteric left hip fracture. Please refer to left hip study describing subtle lucencies through the left superior and inferior pubic rami suspicious for nondisplaced fractures. No evidence of acute femoral fracture. Mild osteoarthritis of the left hip and knee. Prominent soft tissue swelling overlying the lateral aspect of the left hip consistent with contusion. IMPRESSION: 1. Equivocal findings for nondisplaced left superior and inferior pubic rami fractures. CT recommended. Please see left hip exam for complete characterization. 2. Prior ORIF of a healed intertrochanteric left hip fracture. Otherwise unremarkable left femur. 3. Soft tissue swelling overlying the lateral aspect left hip, consistent with contusion. Electronically Signed   By: Sharlet Salina M.D.   On: 10/05/2023 16:42   DG Chest Portable 1 View Result Date: 10/05/2023 CLINICAL DATA:  Slipped and fell, cough  EXAM: PORTABLE CHEST 1 VIEW COMPARISON:  07/05/2021 FINDINGS: Single frontal view of the chest demonstrates stable enlargement of the cardiac silhouette. Lung volumes are diminished, with chronic the pulmonary vasculature. No airspace disease, effusion, or pneumothorax. No acute displaced fracture. Prior midthoracic vertebral augmentation. Diffuse atherosclerosis. IMPRESSION: 1. Low lung volumes.  No acute airspace disease. Electronically Signed   By: Sharlet Salina M.D.   On: 10/05/2023 16:41   DG Hip Unilat With Pelvis 2-3 Views Left Result Date: 10/05/2023 CLINICAL DATA:  Slipped and fell, left hip pain, inability to bear weight EXAM: DG HIP (WITH OR WITHOUT PELVIS) 2-3V LEFT COMPARISON:  04/15/2023 FINDINGS: Frontal view of the pelvis as well as frontal and cross-table  lateral views of the left hip are obtained. The proximal extent of an intramedullary rod and 2 dynamic screws are seen traversing the left hip, without significant change since prior study. Heterotopic ossification adjacent to the left lesser trochanter. Prior healed left inferior pubic ramus fracture. There are subtle lucencies of the left superior pubic ramus and left inferior ramus extending to the ischium, best seen on the frontal view of the left hip. These are likely due to overlying skin folds are superimposed shadows, though minimally displaced fractures cannot be excluded in light of trauma and inability to bear weight. Further evaluation with CT may be useful. Stable mild bilateral hip osteoarthritis. Marked subcutaneous edema overlying the lateral aspect of the left hip. IMPRESSION: 1. Equivocal findings for nondisplaced fractures through the left superior ramus and left inferior ramus/ischium. CT may be useful for further evaluation. 2. Extensive subcutaneous edema over the lateral aspect of the left hip, compatible with contusion after fall. 3. Stable ORIF of a prior healed intertrochanteric left hip fracture. 4. Stable bilateral hip osteoarthritis. Electronically Signed   By: Sharlet Salina M.D.   On: 10/05/2023 16:40        Scheduled Meds:  acetaminophen  1,000 mg Oral Q8H   aspirin EC  325 mg Oral Q breakfast   atorvastatin  80 mg Oral Daily   enoxaparin (LOVENOX) injection  40 mg Subcutaneous Q24H   levothyroxine  50 mcg Oral Q0600   lidocaine  1 patch Transdermal Q24H   metoprolol tartrate  25 mg Oral BID   Continuous Infusions:  cefTRIAXone (ROCEPHIN)  IV 1 g (10/06/23 1700)     LOS: 1 day    Time spent: 45 minutes spent on chart review, discussion with nursing staff, consultants, updating family and interview/physical exam; more than 50% of that time was spent in counseling and/or coordination of care.    Joseph Art, DO [facility] Available via Epic secure  chat 7am-7pm After these hours, please refer to coverage provider listed on [email or web address] 10/07/2023, 11:55 AM

## 2023-10-07 NOTE — TOC Progression Note (Addendum)
 Transition of Care [facility]) - Progression Note    Patient Details  Name: Natasha Harper MRN: [id number] Date of Birth: 03-Jul-1935  Transition of Care [facility]) CM/SW Contact  Jessie Foot, RN Phone Number: 10/07/2023, 8:51 AM  Clinical Narrative:    Vesta Mixer started. NCM spoke to [facility]) to request acceptance. Will call me back by this afternoon. 12:24 PM   3:30 PM [facility] has accepted patient.   Expected Discharge Plan: Home/Self Care Barriers to Discharge: Continued Medical Work up  Expected Discharge Plan and Services   Discharge Planning Services: CM Consult   Living arrangements for the past 2 months: Single Family Home                                       Social Determinants of Health (SDOH) Interventions SDOH Screenings   Food Insecurity: No Food Insecurity (10/05/2023)  Housing: Unknown (10/05/2023)  Transportation Needs: No Transportation Needs (10/05/2023)  Utilities: Patient Declined (10/05/2023)  Depression (PHQ2-9): Low Risk  (01/28/2022)  Social Connections: Socially Isolated (10/05/2023)  Tobacco Use: Medium Risk (10/05/2023)    Readmission Risk Interventions    07/17/2021    3:01 PM  Readmission Risk Prevention Plan  Transportation Screening Complete  PCP or Specialist Appt within 3-5 Days Not Complete  Not Complete comments Plan to d/c to [facility] Inpatient rehab  HRI or Home Care Consult Not Complete  HRI or Home Care Consult comments Patient to d/c to [facility] Inpatient rehab  Social Work Consult for Recovery Care Planning/Counseling Complete  Palliative Care Screening Not Applicable  Medication Review (RN Care Manager) Referral to Pharmacy

## 2023-10-07 NOTE — Progress Notes (Signed)
 Occupational Therapy Treatment Patient Details Name: Natasha Harper MRN: [id number] DOB: [birth year] Today's Date: 10/07/2023   History of present illness Natasha Harper is a 89 yr old female presents with L hip pain after a fall from standing. CT chest/abdomen/pelvis with contrast was negative for acute intrathoracic pathology; Progression of T5 compression fracture since prior CT; Bilateral superior and inferior pubic rami fractures. PMH:  CAD s/p PCI, HTN, HLD, hypothyroidism, Hx of left hip fracture s/p ORIF 09/2021   OT comments  The pt presented with significant L low back and pelvic pain with light activity attempts. As such, her overall activity tolerance was compromised and she was noted to demo guarding of movements. OT instructed her rolling and repositioning at bed level, as such is needed to prep her for progressive ADL participation. She required increased and effort, even increased pain. She was unable to progress to edge of bed or out of bed today. OT further instructed her on ROM and therapeutic exercise at bed level, for which she required active assist for BLE due to pain, with pain worse in LLE. Continue OT plan of care. Patient will benefit from continued inpatient follow up therapy, <3 hours/day.       If plan is discharge home, recommend the following:  Two people to help with walking and/or transfers;Two people to help with bathing/dressing/bathroom;Assistance with cooking/housework;Direct supervision/assist for medications management;Direct supervision/assist for financial management;Assist for transportation;Help with stairs or ramp for entrance   Equipment Recommendations  None recommended by OT    Recommendations for Other Services      Precautions / Restrictions Precautions Precautions: Fall Restrictions Weight Bearing Restrictions Per Provider Order: No       Mobility Bed Mobility Overal bed mobility: Needs Assistance Bed Mobility: Rolling Rolling: Total  assist         General bed mobility comments: OT instructed the pt on rolling and re-positioning at bed level, for general strengthening & to prep her for progressive ADL participation. Pt required total assist with increased cues to perform partial rolling to the R, with use of bed rail required.          ADL either performed or assessed with clinical judgement   ADL      Lower Body Dressing: Total assistance;Bed level        Communication Communication Factors Affecting Communication: Hearing impaired   Cognition Arousal: Alert Behavior During Therapy: Anxious        Following commands impaired: Follows one step commands with increased time      Cueing   Cueing Techniques: Verbal cues, Gestural cues             Pertinent Vitals/ Pain       Pain Assessment Pain Assessment: 0-10 Pain Score: 8  Pain Location: L lower back and pelvic region Pain Descriptors / Indicators: Grimacing, Guarding Pain Intervention(s): Limited activity within patient's tolerance, Monitored during session, Premedicated before session, Patient requesting pain meds-RN notified         Frequency  Min 2X/week        Progress Toward Goals  OT Goals(current goals can now be found in the care plan section)     Acute Rehab OT Goals Patient Stated Goal: to get better OT Goal Formulation: With patient/family Time For Goal Achievement: 10/20/23 Potential to Achieve Goals: Fair  Plan         AM-PAC OT "6 Clicks" Daily Activity     Outcome Measure   Help from another person  eating meals?: A Little Help from another person taking care of personal grooming?: A Little Help from another person toileting, which includes using toliet, bedpan, or urinal?: Total Help from another person bathing (including washing, rinsing, drying)?: A Lot Help from another person to put on and taking off regular upper body clothing?: A Lot Help from another person to put on and taking off regular lower  body clothing?: Total 6 Click Score: 12    End of Session Equipment Utilized During Treatment: Oxygen  OT Visit Diagnosis: Muscle weakness (generalized) (M62.81);Pain;History of falling (Z91.81) Pain - Right/Left: Left Pain - part of body:  (low back and pelvis)   Activity Tolerance Patient limited by pain   Patient Left in bed;with call bell/phone within reach;with bed alarm set;with family/visitor present   Nurse Communication Other (comment) (Nurse cleared pt for therapy participation reported just giving the pt pain medication.)        Time: 1610-9604 OT Time Calculation (min): 22 min  Charges: OT General Charges $OT Visit: 1 Visit OT Treatments $Therapeutic Activity: 8-22 mins      Reuben Likes, OTR/L 10/07/2023, 11:43 AM

## 2023-10-08 DIAGNOSIS — S32592A Other specified fracture of left pubis, initial encounter for closed fracture: Secondary | ICD-10-CM | POA: Diagnosis not present

## 2023-10-08 DIAGNOSIS — S32591A Other specified fracture of right pubis, initial encounter for closed fracture: Secondary | ICD-10-CM | POA: Diagnosis not present

## 2023-10-08 LAB — CBC
HCT: 28.1 % — ABNORMAL LOW (ref 36.0–46.0)
Hemoglobin: 8.8 g/dL — ABNORMAL LOW (ref 12.0–15.0)
MCH: 34.9 pg — ABNORMAL HIGH (ref 26.0–34.0)
MCHC: 31.3 g/dL (ref 30.0–36.0)
MCV: 111.5 fL — ABNORMAL HIGH (ref 80.0–100.0)
Platelets: 132 10*3/uL — ABNORMAL LOW (ref 150–400)
RBC: 2.52 MIL/uL — ABNORMAL LOW (ref 3.87–5.11)
RDW: 14.3 % (ref 11.5–15.5)
WBC: 6 10*3/uL (ref 4.0–10.5)
nRBC: 0 % (ref 0.0–0.2)

## 2023-10-08 LAB — BASIC METABOLIC PANEL WITH GFR
Anion gap: 6 (ref 5–15)
BUN: 18 mg/dL (ref 8–23)
CO2: 21 mmol/L — ABNORMAL LOW (ref 22–32)
Calcium: 7.9 mg/dL — ABNORMAL LOW (ref 8.9–10.3)
Chloride: 103 mmol/L (ref 98–111)
Creatinine, Ser: 0.44 mg/dL (ref 0.44–1.00)
GFR, Estimated: >60 mL/min (ref >60)
Glucose, Bld: 95 mg/dL (ref 70–99)
Potassium: 3.7 mmol/L (ref 3.5–5.1)
Sodium: 130 mmol/L — ABNORMAL LOW (ref 135–145)

## 2023-10-08 MED ORDER — AMLODIPINE BESYLATE 5 MG PO TABS
5.0000 mg | ORAL_TABLET | Freq: Every day | ORAL | Status: DC
  Administered 2023-10-08 – 2023-10-13 (×6): 5 mg via ORAL
  Filled 2023-10-08 (×6): qty 1

## 2023-10-08 NOTE — Progress Notes (Signed)
 PROGRESS NOTE    Natasha Harper  ZOX:[id number] DOB: Nov 18, 1934 DOA: 10/05/2023 PCP: [name], MD    Brief Narrative:  HPI: Natasha Harper is a 89 y.o. female with medical history significant for CAD s/p PCI, HTN, HLD, hypothyroidism, Hx of left hip fracture s/p ORIF 09/2021 who presented to the ED for evaluation of left hip pain after fall at home.   Patient states that she was getting up from her recliner earlier today when she slipped and fell onto her buttocks.  She had subsequent left hip/groin pain and was unable to stand or ambulate on her home.  She denied any head injury or loss of consciousness.  She denied associated lightheadedness, dizziness, chest pain, dyspnea.   Husband states that her ambulatory status has been diminished over the last 2 years.     Significant Imaging Studies: Left hip XR Equivocal findings for nondisplaced fractures through the left superior ramus and left inferior ramus/ischium. CT may be useful for further evaluation. 2. Extensive subcutaneous edema over the lateral aspect of the left hip, compatible with contusion after fall.  3. Stable ORIF of a prior healed intertrochanteric left hip fracture. 4. Stable bilateral hip osteoarthritis. CXR Low lung volumes. No acute airspace disease.  Left femur XR Equivocal findings for nondisplaced left superior and inferior pubic rami fractures. CT recommended. Please see left hip exam for complete characterization. 2. Prior ORIF of a healed intertrochanteric left hip fracture. Otherwise unremarkable left femur.  3. Soft tissue swelling overlying the lateral aspect left hip, consistent with contusion. CT head No acute intracranial process. 2. Stable atrophy and chronic small vessel ischemic changes. CT c-spine No acute cervical spine fracture.  CT chest/abd/pelvis No acute intrathoracic pathology. 2. Progression of T5 compression fracture since the prior CT. Correlation with clinical exam and point tenderness  recommended. 3. Bilateral superior and inferior pubic rami fractures. 4. Sigmoid diverticulosis. No bowel obstruction. Normal appendix. 5. Bilateral inguinal hernias containing short segments of small bowel, left greater than right. No obstruction. 6. Multiple bilateral breast nodules.    Assessment and Plan: * Bilateral pubic rami fractures, closed, initial encounter (HCC) -PT/OT-SNF - Non-operative fracture.  -Will need outpatient referral to [facility] after discharge for f/u. -pain control-- added lidocaine patch and scheduled tylenol- will need PO pain meds prior to PT  Hypoxia -doubt PE. Likely due to pain meds. Wean to RA. Not on home O2. -incentive spirometry  UTI vs pna - IV Rocephin. -culture NGTD  Fall at home, initial encounter -pt does not leave the home per pt and her husband. Does not use walker or cane at home.   Primary hypertension - husband states pt not taking any BP meds at home. -add BP meds and control pain  Hypothyroidism -continue synthroid  Dyslipidemia -continue lipitor.  CAD (coronary artery disease) - stable.on ASA and lipitor.          DVT prophylaxis: enoxaparin (LOVENOX) injection 40 mg Start: 10/06/23 2200    Code Status: Full Code Family Communication: at bedside  Disposition Plan:  Level of care: Med-Surg Status is: Inpatient     Consultants:    Subjective: Continues with pain in lower back-- very hard of hearing  Objective: Vitals:   10/07/23 2145 10/08/23 0531 10/08/23 0904 10/08/23 1129  BP: (!) 160/82 (!) 175/73 (!) 175/73 (!) 144/63  Pulse: 81 77 77 70  Resp:  16  16  Temp:  98.6 F (37 C)  97.6 F (36.4 C)  TempSrc:  SpO2:  94%  93%  Weight:      Height:        Intake/Output Summary (Last 24 hours) at 10/08/2023 1129 Last data filed at 10/08/2023 0800 Gross per 24 hour  Intake 300 ml  Output 775 ml  Net -475 ml   Filed Weights   10/05/23 2015  Weight: 64.2 kg     Examination:   General: Appearance:     Overweight female in no acute distress-- very hard of hearing     Lungs:     respirations unlabored, on [place]  Heart:    Normal heart rate.    MS:   All extremities are intact.    Neurologic:   Awake, alert       Data Reviewed: I have personally reviewed following labs and imaging studies  CBC: Recent Labs  Lab 10/05/23 1626 10/06/23 0432 10/08/23 0427  WBC 7.8 5.6 6.0  NEUTROABS 6.9  --   --   HGB 11.3* 9.4* 8.8*  HCT 35.5* 29.4* 28.1*  MCV 108.6* 109.7* 111.5*  PLT 212 160 132*   Basic Metabolic Panel: Recent Labs  Lab 10/05/23 1626 10/06/23 0432 10/08/23 0427  NA 134* 134* 130*  K 4.1 3.9 3.7  CL 102 105 103  CO2 23 22 21*  GLUCOSE 110* 103* 95  BUN 22 22 18   CREATININE 0.44 <0.30* 0.44  CALCIUM 9.0 8.3* 7.9*   GFR: Estimated Creatinine Clearance: 43.4 mL/min (by C-G formula based on SCr of 0.44 mg/dL). Liver Function Tests: Recent Labs  Lab 10/05/23 1626  AST 31  ALT 37  ALKPHOS 89  BILITOT 0.4  PROT 6.5  ALBUMIN 3.6   No results for input(s): "LIPASE", "AMYLASE" in the last 168 hours. No results for input(s): "AMMONIA" in the last 168 hours. Coagulation Profile: Recent Labs  Lab 10/05/23 1626  INR 1.2   Cardiac Enzymes: No results for input(s): "CKTOTAL", "CKMB", "CKMBINDEX", "TROPONINI" in the last 168 hours. BNP (last 3 results) No results for input(s): "PROBNP" in the last 8760 hours. HbA1C: No results for input(s): "HGBA1C" in the last 72 hours. CBG: No results for input(s): "GLUCAP" in the last 168 hours. Lipid Profile: No results for input(s): "CHOL", "HDL", "LDLCALC", "TRIG", "CHOLHDL", "LDLDIRECT" in the last 72 hours. Thyroid Function Tests: No results for input(s): "TSH", "T4TOTAL", "FREET4", "T3FREE", "THYROIDAB" in the last 72 hours. Anemia Panel: No results for input(s): "VITAMINB12", "FOLATE", "FERRITIN", "TIBC", "IRON", "RETICCTPCT" in the last 72 hours. Sepsis Labs: Recent  Labs  Lab 10/05/23 1654  LATICACIDVEN 1.1    Recent Results (from the past 240 hours)  Resp panel by RT-PCR (RSV, Flu A&B, Covid) Anterior Nasal Swab     Status: None   Collection Time: 10/05/23  4:26 PM   Specimen: Anterior Nasal Swab  Result Value Ref Range Status   SARS Coronavirus 2 by RT PCR NEGATIVE NEGATIVE Final    Comment: (NOTE) SARS-CoV-2 target nucleic acids are NOT DETECTED.  The SARS-CoV-2 RNA is generally detectable in upper respiratory specimens during the acute phase of infection. The lowest concentration of SARS-CoV-2 viral copies this assay can detect is 138 copies/mL. A negative result does not preclude SARS-Cov-2 infection and should not be used as the sole basis for treatment or other patient management decisions. A negative result may occur with  improper specimen collection/handling, submission of specimen other than nasopharyngeal swab, presence of viral mutation(s) within the areas targeted by this assay, and inadequate number of viral copies(<138 copies/mL). A negative result  must be combined with clinical observations, patient history, and epidemiological information. The expected result is Negative.  Fact Sheet for Patients:  BloggerCourse.com  Fact Sheet for Healthcare Providers:  SeriousBroker.it  This test is no t yet approved or cleared by the [place] FDA and  has been authorized for detection and/or diagnosis of SARS-CoV-2 by FDA under an Emergency Use Authorization (EUA). This EUA will remain  in effect (meaning this test can be used) for the duration of the COVID-19 declaration under Section 564(b)(1) of the Act, 21 U.S.C.section 360bbb-3(b)(1), unless the authorization is terminated  or revoked sooner.       Influenza A by PCR NEGATIVE NEGATIVE Final   Influenza B by PCR NEGATIVE NEGATIVE Final    Comment: (NOTE) The Xpert Xpress SARS-CoV-2/FLU/RSV plus assay is intended as an  aid in the diagnosis of influenza from Nasopharyngeal swab specimens and should not be used as a sole basis for treatment. Nasal washings and aspirates are unacceptable for Xpert Xpress SARS-CoV-2/FLU/RSV testing.  Fact Sheet for Patients: BloggerCourse.com  Fact Sheet for Healthcare Providers: SeriousBroker.it  This test is not yet approved or cleared by the [place] FDA and has been authorized for detection and/or diagnosis of SARS-CoV-2 by FDA under an Emergency Use Authorization (EUA). This EUA will remain in effect (meaning this test can be used) for the duration of the COVID-19 declaration under Section 564(b)(1) of the Act, 21 U.S.C. section 360bbb-3(b)(1), unless the authorization is terminated or revoked.     Resp Syncytial Virus by PCR NEGATIVE NEGATIVE Final    Comment: (NOTE) Fact Sheet for Patients: BloggerCourse.com  Fact Sheet for Healthcare Providers: SeriousBroker.it  This test is not yet approved or cleared by the [place] FDA and has been authorized for detection and/or diagnosis of SARS-CoV-2 by FDA under an Emergency Use Authorization (EUA). This EUA will remain in effect (meaning this test can be used) for the duration of the COVID-19 declaration under Section 564(b)(1) of the Act, 21 U.S.C. section 360bbb-3(b)(1), unless the authorization is terminated or revoked.  Performed at [facility], [address] [place], [place] [address]   Blood Culture (routine x 2)     Status: None (Preliminary result)   Collection Time: 10/05/23  4:33 PM   Specimen: BLOOD  Result Value Ref Range Status   Specimen Description   Final    BLOOD RIGHT ANTECUBITAL Performed at [facility], [address], [place], [place] [address]    Special Requests   Final    BOTTLES DRAWN AEROBIC AND ANAEROBIC Blood Culture results may  not be optimal due to an inadequate volume of blood received in culture bottles Performed at [facility], [address] [place], [place] [address]    Culture   Final    NO GROWTH 3 DAYS Performed at [facility], [address], [place], [place] [address]    Report Status PENDING  Incomplete  Blood Culture (routine x 2)     Status: None (Preliminary result)   Collection Time: 10/05/23  4:50 PM   Specimen: BLOOD RIGHT HAND  Result Value Ref Range Status   Specimen Description   Final    BLOOD RIGHT HAND Performed at [facility], [address], [place], [place] [address]    Special Requests   Final    BOTTLES DRAWN AEROBIC AND ANAEROBIC Blood Culture results may not be optimal due to an inadequate volume of blood received in culture  bottles Performed at [facility], [address], [place], [place] [address]    Culture   Final    NO GROWTH 3 DAYS Performed at [facility], [address], [place], [place] [address]    Report Status PENDING  Incomplete  Urine Culture     Status: Abnormal   Collection Time: 10/05/23  6:11 PM   Specimen: Urine, Clean Catch  Result Value Ref Range Status   Specimen Description   Final    URINE, CLEAN CATCH Performed at [facility], [address] [place], [place] [address]    Special Requests   Final    NONE Reflexed from [id number] Performed at [facility], [address], [place], [place] [address]    Culture MULTIPLE SPECIES PRESENT, SUGGEST RECOLLECTION (A)  Final   Report Status 10/06/2023 FINAL  Final         Radiology Studies: No results found.       Scheduled Meds:  acetaminophen  1,000 mg Oral Q8H   amLODipine  5 mg Oral Daily   aspirin EC  325 mg Oral Q breakfast   atorvastatin  80 mg Oral Daily   enoxaparin (LOVENOX) injection  40 mg Subcutaneous Q24H   levothyroxine  50 mcg Oral Q0600   lidocaine  1 patch Transdermal Q24H    metoprolol tartrate  25 mg Oral BID   Continuous Infusions:  cefTRIAXone (ROCEPHIN)  IV 1 g (10/07/23 1604)     LOS: 2 days    Time spent: 45 minutes spent on chart review, discussion with nursing staff, consultants, updating family and interview/physical exam; more than 50% of that time was spent in counseling and/or coordination of care.    Joseph Art, DO [facility] Available via Epic secure chat 7am-7pm After these hours, please refer to coverage provider listed on [email or web address] 10/08/2023, 11:29 AM

## 2023-10-08 NOTE — Progress Notes (Signed)
 Physical Therapy Treatment Patient Details Name: Natasha Harper MRN: [id number] DOB: 1935-05-19 Today's Date: 10/08/2023   History of Present Illness Natasha Harper is a 89 y.o. female presents with L hip pain after a fall from standing. CT chest/abdomen/pelvis with contrast was negative for acute intrathoracic pathology; Progression of T5 compression fracture since prior CT; Bilateral superior and inferior pubic rami fractures. PMH:  CAD s/p PCI, HTN, HLD, hypothyroidism, Hx of left hip fracture s/p ORIF 09/2021    PT Comments  Pt in bed with husband at bedside when PT arrives. Nursing staff present as well. Pt has not voided bladder since this morning and feels she needs to do so. Pt apprehensive about mobility, but agreeable to attempt in segments with symptom response. She is able to come to sit EOB with 2 max A for trunk and LE assist to mitigate pain. Once sitting pain settles. Pt is able to stand with mod A using 2WW but max A x2 for pivot to BSC. Pt maintains upright posture while on commode, completes hip flexion and static positioning in an attempt to assist pt bladder voiding and control pain during this task. Pt unable to urinate while on commode, able to teach back sequence of standing, cleaning following BM, and turn to sit EOB. Nursing staff present to assist with cleaning and returning to bed. Based on pt PLOF, level of support, and current functional status, she would benefit from skilled PT <3 hours per day at dc.      If plan is discharge home, recommend the following: Two people to help with walking and/or transfers;Two people to help with bathing/dressing/bathroom;Assistance with cooking/housework;Assist for transportation;Help with stairs or ramp for entrance;A lot of help with walking and/or transfers;A lot of help with bathing/dressing/bathroom   Can travel by private vehicle     No  Equipment Recommendations  None recommended by PT    Recommendations for Other Services        Precautions / Restrictions Precautions Precautions: Fall Recall of Precautions/Restrictions: Intact Restrictions Weight Bearing Restrictions Per Provider Order: Yes RLE Weight Bearing Per Provider Order: Weight bearing as tolerated LLE Weight Bearing Per Provider Order: Weight bearing as tolerated     Mobility  Bed Mobility Overal bed mobility: Needs Assistance Bed Mobility: Rolling, Supine to Sit Rolling: Total assist   Supine to sit: Max assist, +2 for physical assistance     General bed mobility comments: Pain increased during task, but returns to baseline after coming to sit EOB Patient Response: Anxious  Transfers Overall transfer level: Needs assistance Equipment used: Rolling walker (2 wheels) Transfers: Bed to chair/wheelchair/BSC, Sit to/from Stand Sit to Stand: Mod assist   Step pivot transfers: Max assist, +2 safety/equipment       General transfer comment: Pt apprehensive and fear avoidant noting she frequently feels as though she is not supported or she will fall. no LOB or instability noted    Ambulation/Gait                   Stairs             Wheelchair Mobility     Tilt Bed Tilt Bed Patient Response: Anxious  Modified Rankin (Stroke Patients Only)       Balance Overall balance assessment: History of Falls, Needs assistance Sitting-balance support: Bilateral upper extremity supported, Feet supported Sitting balance-[name]: Fair   Postural control: Posterior lean Standing balance support: Bilateral upper extremity supported, During functional activity, Reliant on assistive device  for balance Standing balance-[name]: Poor                              Communication Communication Communication: Impaired Factors Affecting Communication: Hearing impaired  Cognition Arousal: Alert Behavior During Therapy: Anxious   PT - Cognitive impairments: No apparent impairments                          Following commands: Intact Following commands impaired: Follows multi-step commands with increased time (able to terach back sequence of activity prior to completing)    Cueing Cueing Techniques: Verbal cues, Gestural cues  Exercises      General Comments General comments (skin integrity, edema, etc.): 2L via [place], asymptomatic. Pt unable to void bladder since 0530. BM while standing EOB      Pertinent Vitals/Pain Pain Assessment Pain Assessment: 0-10 Pain Score: 6  Pain Location: L lower back and pelvic region Pain Descriptors / Indicators: Grimacing, Guarding, Sharp Pain Intervention(s): Limited activity within patient's tolerance, Monitored during session, Repositioned, Premedicated before session    Home Living                          Prior Function            PT Goals (current goals can now be found in the care plan section) Acute Rehab PT Goals Patient Stated Goal: less pain PT Goal Formulation: With patient/family Potential to Achieve Goals: Fair Progress towards PT goals: Progressing toward goals    Frequency    Min 2X/week      PT Plan      Co-evaluation              AM-PAC PT "6 Clicks" Mobility   Outcome Measure  Help needed turning from your back to your side while in a flat bed without using bedrails?: Total Help needed moving from lying on your back to sitting on the side of a flat bed without using bedrails?: Total Help needed moving to and from a bed to a chair (including a wheelchair)?: Total Help needed standing up from a chair using your arms (e.g., wheelchair or bedside chair)?: Total Help needed to walk in hospital room?: Total Help needed climbing 3-5 steps with a railing? : Total 6 Click Score: 6    End of Session Equipment Utilized During Treatment: Oxygen;Gait belt Activity Tolerance: Patient limited by pain Patient left: in bed;with call bell/phone within reach;with bed alarm set;with family/visitor  present;with nursing/sitter in room Nurse Communication: Mobility status PT Visit Diagnosis: Other abnormalities of gait and mobility (R26.89);Muscle weakness (generalized) (M62.81);History of falling (Z91.81);Pain Pain - Right/Left: Right Pain - part of body: Hip     Time: 1157-1302 PT Time Calculation (min) (ACUTE ONLY): 65 min  Charges:    $Therapeutic Activity: 53-67 mins PT General Charges $$ ACUTE PT VISIT: 1 Visit                     Madaline Guthrie, PT Acute Rehabilitation Services Office: [phone] 10/08/2023    Evelena Peat 10/08/2023, 1:44 PM

## 2023-10-08 NOTE — Plan of Care (Signed)

## 2023-10-09 DIAGNOSIS — S32591A Other specified fracture of right pubis, initial encounter for closed fracture: Secondary | ICD-10-CM | POA: Diagnosis not present

## 2023-10-09 DIAGNOSIS — S32592A Other specified fracture of left pubis, initial encounter for closed fracture: Secondary | ICD-10-CM | POA: Diagnosis not present

## 2023-10-09 LAB — BASIC METABOLIC PANEL WITH GFR
Anion gap: 10 (ref 5–15)
Anion gap: 10 (ref 5–15)
BUN: 16 mg/dL (ref 8–23)
BUN: 17 mg/dL (ref 8–23)
CO2: 19 mmol/L — ABNORMAL LOW (ref 22–32)
CO2: 20 mmol/L — ABNORMAL LOW (ref 22–32)
Calcium: 8.3 mg/dL — ABNORMAL LOW (ref 8.9–10.3)
Calcium: 8 mg/dL — ABNORMAL LOW (ref 8.9–10.3)
Chloride: 96 mmol/L — ABNORMAL LOW (ref 98–111)
Chloride: 93 mmol/L — ABNORMAL LOW (ref 98–111)
Creatinine, Ser: <0.3 mg/dL — ABNORMAL LOW (ref 0.44–1.00)
Creatinine, Ser: 0.57 mg/dL (ref 0.44–1.00)
GFR, Estimated: >60 mL/min (ref >60)
Glucose, Bld: 91 mg/dL (ref 70–99)
Glucose, Bld: 153 mg/dL — ABNORMAL HIGH (ref 70–99)
Potassium: 3.8 mmol/L (ref 3.5–5.1)
Potassium: 3.9 mmol/L (ref 3.5–5.1)
Sodium: 125 mmol/L — ABNORMAL LOW (ref 135–145)
Sodium: 123 mmol/L — ABNORMAL LOW (ref 135–145)

## 2023-10-09 LAB — CBC
HCT: 28 % — ABNORMAL LOW (ref 36.0–46.0)
Hemoglobin: 9 g/dL — ABNORMAL LOW (ref 12.0–15.0)
MCH: 34.7 pg — ABNORMAL HIGH (ref 26.0–34.0)
MCHC: 32.1 g/dL (ref 30.0–36.0)
MCV: 108.1 fL — ABNORMAL HIGH (ref 80.0–100.0)
Platelets: 139 10*3/uL — ABNORMAL LOW (ref 150–400)
RBC: 2.59 MIL/uL — ABNORMAL LOW (ref 3.87–5.11)
RDW: 13.5 % (ref 11.5–15.5)
WBC: 5.5 10*3/uL (ref 4.0–10.5)
nRBC: 0 % (ref 0.0–0.2)

## 2023-10-09 LAB — SODIUM, URINE, RANDOM: Sodium, Ur: <10 mmol/L

## 2023-10-09 LAB — OSMOLALITY, URINE: Osmolality, Ur: 718 mosm/kg (ref 300–900)

## 2023-10-09 MED ORDER — SODIUM BICARBONATE 650 MG PO TABS
650.0000 mg | ORAL_TABLET | Freq: Every day | ORAL | Status: DC
  Administered 2023-10-09 – 2023-10-11 (×3): 650 mg via ORAL
  Filled 2023-10-09 (×3): qty 1

## 2023-10-09 MED ORDER — FUROSEMIDE 10 MG/ML IJ SOLN
20.0000 mg | Freq: Once | INTRAMUSCULAR | Status: AC
  Administered 2023-10-09: 20 mg via INTRAVENOUS
  Filled 2023-10-09: qty 2

## 2023-10-09 NOTE — Plan of Care (Signed)

## 2023-10-09 NOTE — Progress Notes (Signed)
 PROGRESS NOTE    Natasha Harper  UEA:[id number] DOB: Apr 20, 1935 DOA: 10/05/2023 PCP: [name], MD    Brief Narrative:  HPI: Natasha Harper is a 89 y.o. female with medical history significant for CAD s/p PCI, HTN, HLD, hypothyroidism, Hx of left hip fracture s/p ORIF 09/2021 who presented to the ED for evaluation of left hip pain after fall at home.   Patient states that she was getting up from her recliner earlier today when she slipped and fell onto her buttocks.  She had subsequent left hip/groin pain and was unable to stand or ambulate on her home.  She denied any head injury or loss of consciousness.  She denied associated lightheadedness, dizziness, chest pain, dyspnea.   Husband states that her ambulatory status has been diminished over the last 2 years.     Significant Imaging Studies: Left hip XR Equivocal findings for nondisplaced fractures through the left superior ramus and left inferior ramus/ischium. CT may be useful for further evaluation. 2. Extensive subcutaneous edema over the lateral aspect of the left hip, compatible with contusion after fall.  3. Stable ORIF of a prior healed intertrochanteric left hip fracture. 4. Stable bilateral hip osteoarthritis. CXR Low lung volumes. No acute airspace disease.  Left femur XR Equivocal findings for nondisplaced left superior and inferior pubic rami fractures. CT recommended. Please see left hip exam for complete characterization. 2. Prior ORIF of a healed intertrochanteric left hip fracture. Otherwise unremarkable left femur.  3. Soft tissue swelling overlying the lateral aspect left hip, consistent with contusion. CT head No acute intracranial process. 2. Stable atrophy and chronic small vessel ischemic changes. CT c-spine No acute cervical spine fracture.  CT chest/abd/pelvis No acute intrathoracic pathology. 2. Progression of T5 compression fracture since the prior CT. Correlation with clinical exam and point tenderness  recommended. 3. Bilateral superior and inferior pubic rami fractures. 4. Sigmoid diverticulosis. No bowel obstruction. Normal appendix. 5. Bilateral inguinal hernias containing short segments of small bowel, left greater than right. No obstruction. 6. Multiple bilateral breast nodules.    Assessment and Plan: Hyponatremia -? Volume overload -IV lasix x 1 and recheck Na   Bilateral pubic rami fractures, closed, initial encounter (HCC) -PT/OT-SNF - Non-operative fracture.  -Will need outpatient referral to [facility] after discharge for f/u. -pain control-- added lidocaine patch and scheduled tylenol- will need PO pain meds prior to PT  Hypoxia -doubt PE. Likely due to pain meds. Wean to RA. Not on home O2. -incentive spirometry  UTI vs pna - IV Rocephin. -culture NGTD  Fall at home, initial encounter -pt does not leave the home per pt and her husband. Does not use walker or cane at home.   Primary hypertension - husband states pt not taking any BP meds at home. -add BP meds and control pain  Hypothyroidism -continue synthroid  Dyslipidemia -continue lipitor.  CAD (coronary artery disease) - stable.on ASA and lipitor.     Would recommend palliative care follow at SNF     DVT prophylaxis: enoxaparin (LOVENOX) injection 40 mg Start: 10/06/23 2200    Code Status: Full Code Family Communication: at bedside  Disposition Plan:  Level of care: Med-Surg Status is: Inpatient     Consultants:    Subjective: Denies SOB, CP  Objective: Vitals:   10/09/23 0410 10/09/23 0915 10/09/23 1021 10/09/23 1328  BP: (!) 168/80 (!) 178/67 (!) 154/72 134/70  Pulse: 68 81 75 72  Resp: 20   18  Temp: 97.9 F (  36.6 C)  98.1 F (36.7 C) 98.1 F (36.7 C)  TempSrc:   Oral Oral  SpO2: 97%  98% 97%  Weight:      Height:        Intake/Output Summary (Last 24 hours) at 10/09/2023 1417 Last data filed at 10/09/2023 0050 Gross per 24 hour  Intake --  Output 550 ml  Net  -550 ml   Filed Weights   10/05/23 2015  Weight: 64.2 kg    Examination:   General: Appearance:     Overweight female in no acute distress-- very hard of hearing     Lungs:     respirations unlabored, on [place]  Heart:    Normal heart rate.    MS:   All extremities are intact.    Neurologic:   Awake, alert       Data Reviewed: I have personally reviewed following labs and imaging studies  CBC: Recent Labs  Lab 10/05/23 1626 10/06/23 0432 10/08/23 0427 10/09/23 0414  WBC 7.8 5.6 6.0 5.5  NEUTROABS 6.9  --   --   --   HGB 11.3* 9.4* 8.8* 9.0*  HCT 35.5* 29.4* 28.1* 28.0*  MCV 108.6* 109.7* 111.5* 108.1*  PLT 212 160 132* 139*   Basic Metabolic Panel: Recent Labs  Lab 10/05/23 1626 10/06/23 0432 10/08/23 0427 10/09/23 0414  NA 134* 134* 130* 125*  K 4.1 3.9 3.7 3.8  CL 102 105 103 96*  CO2 23 22 21* 19*  GLUCOSE 110* 103* 95 91  BUN 22 22 18 16   CREATININE 0.44 <0.30* 0.44 <0.30*  CALCIUM 9.0 8.3* 7.9* 8.3*   GFR: CrCl cannot be calculated (This lab value cannot be used to calculate CrCl because it is not a number: <0.30). Liver Function Tests: Recent Labs  Lab 10/05/23 1626  AST 31  ALT 37  ALKPHOS 89  BILITOT 0.4  PROT 6.5  ALBUMIN 3.6   No results for input(s): "LIPASE", "AMYLASE" in the last 168 hours. No results for input(s): "AMMONIA" in the last 168 hours. Coagulation Profile: Recent Labs  Lab 10/05/23 1626  INR 1.2   Cardiac Enzymes: No results for input(s): "CKTOTAL", "CKMB", "CKMBINDEX", "TROPONINI" in the last 168 hours. BNP (last 3 results) No results for input(s): "PROBNP" in the last 8760 hours. HbA1C: No results for input(s): "HGBA1C" in the last 72 hours. CBG: No results for input(s): "GLUCAP" in the last 168 hours. Lipid Profile: No results for input(s): "CHOL", "HDL", "LDLCALC", "TRIG", "CHOLHDL", "LDLDIRECT" in the last 72 hours. Thyroid Function Tests: No results for input(s): "TSH", "T4TOTAL", "FREET4", "T3FREE",  "THYROIDAB" in the last 72 hours. Anemia Panel: No results for input(s): "VITAMINB12", "FOLATE", "FERRITIN", "TIBC", "IRON", "RETICCTPCT" in the last 72 hours. Sepsis Labs: Recent Labs  Lab 10/05/23 1654  LATICACIDVEN 1.1    Recent Results (from the past 240 hours)  Resp panel by RT-PCR (RSV, Flu A&B, Covid) Anterior Nasal Swab     Status: None   Collection Time: 10/05/23  4:26 PM   Specimen: Anterior Nasal Swab  Result Value Ref Range Status   SARS Coronavirus 2 by RT PCR NEGATIVE NEGATIVE Final    Comment: (NOTE) SARS-CoV-2 target nucleic acids are NOT DETECTED.  The SARS-CoV-2 RNA is generally detectable in upper respiratory specimens during the acute phase of infection. The lowest concentration of SARS-CoV-2 viral copies this assay can detect is 138 copies/mL. A negative result does not preclude SARS-Cov-2 infection and should not be used as the sole basis for treatment  or other patient management decisions. A negative result may occur with  improper specimen collection/handling, submission of specimen other than nasopharyngeal swab, presence of viral mutation(s) within the areas targeted by this assay, and inadequate number of viral copies(<138 copies/mL). A negative result must be combined with clinical observations, patient history, and epidemiological information. The expected result is Negative.  Fact Sheet for Patients:  BloggerCourse.com  Fact Sheet for Healthcare Providers:  SeriousBroker.it  This test is no t yet approved or cleared by the [place] FDA and  has been authorized for detection and/or diagnosis of SARS-CoV-2 by FDA under an Emergency Use Authorization (EUA). This EUA will remain  in effect (meaning this test can be used) for the duration of the COVID-19 declaration under Section 564(b)(1) of the Act, 21 U.S.C.section 360bbb-3(b)(1), unless the authorization is terminated  or revoked sooner.        Influenza A by PCR NEGATIVE NEGATIVE Final   Influenza B by PCR NEGATIVE NEGATIVE Final    Comment: (NOTE) The Xpert Xpress SARS-CoV-2/FLU/RSV plus assay is intended as an aid in the diagnosis of influenza from Nasopharyngeal swab specimens and should not be used as a sole basis for treatment. Nasal washings and aspirates are unacceptable for Xpert Xpress SARS-CoV-2/FLU/RSV testing.  Fact Sheet for Patients: BloggerCourse.com  Fact Sheet for Healthcare Providers: SeriousBroker.it  This test is not yet approved or cleared by the [place] FDA and has been authorized for detection and/or diagnosis of SARS-CoV-2 by FDA under an Emergency Use Authorization (EUA). This EUA will remain in effect (meaning this test can be used) for the duration of the COVID-19 declaration under Section 564(b)(1) of the Act, 21 U.S.C. section 360bbb-3(b)(1), unless the authorization is terminated or revoked.     Resp Syncytial Virus by PCR NEGATIVE NEGATIVE Final    Comment: (NOTE) Fact Sheet for Patients: BloggerCourse.com  Fact Sheet for Healthcare Providers: SeriousBroker.it  This test is not yet approved or cleared by the [place] FDA and has been authorized for detection and/or diagnosis of SARS-CoV-2 by FDA under an Emergency Use Authorization (EUA). This EUA will remain in effect (meaning this test can be used) for the duration of the COVID-19 declaration under Section 564(b)(1) of the Act, 21 U.S.C. section 360bbb-3(b)(1), unless the authorization is terminated or revoked.  Performed at [facility], [address], [place], [place] [address]   Blood Culture (routine x 2)     Status: None (Preliminary result)   Collection Time: 10/05/23  4:33 PM   Specimen: BLOOD  Result Value Ref Range Status   Specimen Description   Final    BLOOD RIGHT  ANTECUBITAL Performed at [facility], [address] [place], [place] [address]    Special Requests   Final    BOTTLES DRAWN AEROBIC AND ANAEROBIC Blood Culture results may not be optimal due to an inadequate volume of blood received in culture bottles Performed at [facility], [address], [place], [place] [address]    Culture   Final    NO GROWTH 4 DAYS Performed at [facility], [address], [place], [place] [address]    Report Status PENDING  Incomplete  Blood Culture (routine x 2)     Status: None (Preliminary result)   Collection Time: 10/05/23  4:50 PM   Specimen: BLOOD RIGHT HAND  Result Value Ref Range Status   Specimen Description   Final    BLOOD RIGHT HAND Performed at [facility], [address], [place], [place] [address]    Special Requests   Final    BOTTLES DRAWN AEROBIC AND ANAEROBIC Blood Culture results may not be optimal due to an inadequate volume of blood received in culture bottles Performed at [facility], [address], [place], [place] [address]    Culture   Final    NO GROWTH 4 DAYS Performed at [facility], [address], [place], [place] [address]    Report Status PENDING  Incomplete  Urine Culture     Status: Abnormal   Collection Time: 10/05/23  6:11 PM   Specimen: Urine, Clean Catch  Result Value Ref Range Status   Specimen Description   Final    URINE, CLEAN CATCH Performed at [facility], [address], [place], [place] [address]    Special Requests   Final    NONE Reflexed from [id number] Performed at [facility], [address] [place], [place] [address]    Culture MULTIPLE SPECIES PRESENT, SUGGEST RECOLLECTION (A)  Final   Report Status 10/06/2023 FINAL  Final         Radiology Studies: No results found.       Scheduled Meds:  acetaminophen  1,000 mg Oral Q8H   amLODipine  5 mg Oral Daily   aspirin EC  325  mg Oral Q breakfast   atorvastatin  80 mg Oral Daily   enoxaparin (LOVENOX) injection  40 mg Subcutaneous Q24H   levothyroxine  50 mcg Oral Q0600   lidocaine  1 patch Transdermal Q24H   metoprolol tartrate  25 mg Oral BID   Continuous Infusions:     LOS: 3 days    Time spent: 45 minutes spent on chart review, discussion with nursing staff, consultants, updating family and interview/physical exam; more than 50% of that time was spent in counseling and/or coordination of care.    Joseph Art, DO [facility] Available via Epic secure chat 7am-7pm After these hours, please refer to coverage provider listed on [email or web address] 10/09/2023, 2:17 PM

## 2023-10-09 NOTE — Progress Notes (Signed)
 This RN educated patient on turning every 2 hours because patient states she does not want to be moved due to her pain when staff moves her. Care plan provided

## 2023-10-09 NOTE — TOC Progression Note (Signed)
 Transition of Care [facility]) - Progression Note    Patient Details  Name: CYTHNIA OSMUN MRN: [id number] Date of Birth: 11/04/1934  Transition of Care [facility] CM/SW Contact  Jessie Foot, RN Phone Number: 10/09/2023, 2:33 PM  Clinical Narrative:    NCM spoke with Venancio Poisson Lowanda Foster) to inform them that patient will not be discharged today due to adnormal labs (low NA). NCM asked [place] if patient is discharged over the weekend will [facility] accept. [place] say yes, patient can transfer over the weekend. Current auth for [employer] expires today (10/09/2023). CMN will need to request a new auth..  Expected Discharge Plan: Home/Self Care Barriers to Discharge: Continued Medical Work up  Expected Discharge Plan and Services   Discharge Planning Services: CM Consult   Living arrangements for the past 2 months: Single Family Home                                       Social Determinants of Health (SDOH) Interventions SDOH Screenings   Food Insecurity: No Food Insecurity (10/05/2023)  Housing: Unknown (10/05/2023)  Transportation Needs: No Transportation Needs (10/05/2023)  Utilities: Patient Declined (10/05/2023)  Depression (PHQ2-9): Low Risk  (01/28/2022)  Social Connections: Socially Isolated (10/05/2023)  Tobacco Use: Medium Risk (10/05/2023)    Readmission Risk Interventions    07/17/2021    3:01 PM  Readmission Risk Prevention Plan  Transportation Screening Complete  PCP or Specialist Appt within 3-5 Days Not Complete  Not Complete comments Plan to d/c to [facility]patient rehab  HRI or Home Care Consult Not Complete  HRI or Home Care Consult comments Patient to d/c to [facility] Inpatient rehab  Social Work Consult for Recovery Care Planning/Counseling Complete  Palliative Care Screening Not Applicable  Medication Review (RN Care Manager) Referral to Pharmacy

## 2023-10-09 NOTE — Progress Notes (Signed)
 Physical Therapy Treatment Patient Details Name: Natasha Harper MRN: [id number] DOB: 1934/08/24 Today's Date: 10/09/2023   History of Present Illness Natasha Harper is a 89 y.o. female presents with L hip pain after a fall from standing. CT chest/abdomen/pelvis with contrast was negative for acute intrathoracic pathology; Progression of T5 compression fracture since prior CT; Bilateral superior and inferior pubic rami fractures. PMH:  CAD s/p PCI, HTN, HLD, hypothyroidism, Hx of left hip fracture s/p ORIF 09/2021    PT Comments   PT - Cognition Comments: AxO x 2 HOH requiring repeat instructions.  Spouse present during session.  Married 65 years. Spouse stated, Pt has been sleeping in her LIFT CHAIR and was amb "some" in the home.  Her amb has greatly decreased since her L hip Fx 2023.  Also present with anxiety/fear of falling. Pt in bed on 2 lts sats 97%.  Assisted to EOB was very difficult.  General bed mobility comments: Pt required Max/Total Assist + 2 to transfer to EOB.  Pt 5% due to pain/fear/anxiety/weakness.  Once upright, Pt was able to static sit at Supervision level.  Reports L hip/buttock pain. General transfer comment: Pt required Max Assist + 2 to rise from bed then Total Assist to complete 1/4 pivot to [facility].  Pt was unable to weight shift or advance either LE.  RA decreased to 84% with dyspnea 3/4. Pt has a BM/void.  Assisted with peri care then while pt was standing, switch BSC with recliner from behind Pt.  Assisted with stand to sit.  Pt was unable to amb. Unable to advance either LE. Then required Total Assist + 2 to scoot to back of recliner.  Positioned to comfort.  MAXI MOVE PAD in place under Pt for nursing to use LIFT for back to bed. Pt will need ST Rehab at SNF to address mobility and functional decline prior to safely returning home.      If plan is discharge home, recommend the following: Two people to help with walking and/or transfers;Two people to help with  bathing/dressing/bathroom;Assistance with cooking/housework;Assist for transportation;Help with stairs or ramp for entrance;A lot of help with walking and/or transfers;A lot of help with bathing/dressing/bathroom   Can travel by private vehicle     No  Equipment Recommendations  None recommended by PT    Recommendations for Other Services       Precautions / Restrictions Precautions Precautions: Fall Precaution/Restrictions Comments: LIFT CHAIR Restrictions Weight Bearing Restrictions Per Provider Order: No RLE Weight Bearing Per Provider Order: Weight bearing as tolerated LLE Weight Bearing Per Provider Order: Weight bearing as tolerated     Mobility  Bed Mobility Overal bed mobility: Needs Assistance       Supine to sit: Max assist, Total assist, +2 for physical assistance, +2 for safety/equipment     General bed mobility comments: Pt required Max/Total Assist + 2 to transfer to EOB.  Pt 5% due to pain/fear/anxiety/weakness.  Once upright, Pt was able to static sit at Supervision level.  Reports L hip/buttock pain.    Transfers Overall transfer level: Needs assistance Equipment used: Rolling walker (2 wheels) Transfers: Bed to chair/wheelchair/BSC   Stand pivot transfers: Max assist, +2 physical assistance, +2 safety/equipment         General transfer comment: Pt required Max Assist + 2 to rise from bed then Total Assist to complete 1/4 pivot to [facility].  Pt was unable to weight shift or advance either LE.  Pt has a BM/void.  Assisted  with peri care then while pt was standing, switch BSC with recliner from behind Pt.  Assisted with stand to sit.  Then required Total Assist + 2 to scoot to back of recliner.  Positioned to comfort.  MAXI MOVE PAD in place under Pt for nursing to use LIFT for back to bed.    Ambulation/Gait               General Gait Details: transfer only due to pain/anxiety/weakness   Stairs             Wheelchair Mobility     Tilt  Bed    Modified Rankin (Stroke Patients Only)       Balance                                            Communication Communication Communication: Impaired Factors Affecting Communication: Hearing impaired  Cognition Arousal: Alert Behavior During Therapy: Anxious   PT - Cognitive impairments: No apparent impairments                       PT - Cognition Comments: AxO x 2 HOH requiring repeat instructions.  Spouse present during session.  Spouse stated, Pt has been sleeping in her LIFT CHAIR and was amb "some" in the home.  Her amb has greatly decreased since her L hip Fx 2023.  Also present with anxiety/fear of falling. Following commands: Intact Following commands impaired: Follows multi-step commands with increased time    Cueing Cueing Techniques: Verbal cues, Gestural cues  Exercises      General Comments        Pertinent Vitals/Pain Pain Assessment Pain Assessment: Faces Faces Pain Scale: Hurts even more Pain Location: L hip Pain Descriptors / Indicators: Grimacing, Guarding, Sharp, Tender, Tightness Pain Intervention(s): Monitored during session, Premedicated before session, Repositioned    Home Living                          Prior Function            PT Goals (current goals can now be found in the care plan section) Progress towards PT goals: Progressing toward goals    Frequency           PT Plan      Co-evaluation              AM-PAC PT "6 Clicks" Mobility   Outcome Measure  Help needed turning from your back to your side while in a flat bed without using bedrails?: Total Help needed moving from lying on your back to sitting on the side of a flat bed without using bedrails?: Total Help needed moving to and from a bed to a chair (including a wheelchair)?: Total Help needed standing up from a chair using your arms (e.g., wheelchair or bedside chair)?: Total Help needed to walk in hospital room?:  Total Help needed climbing 3-5 steps with a railing? : Total 6 Click Score: 6    End of Session Equipment Utilized During Treatment: Gait belt Activity Tolerance: Patient limited by pain;Patient limited by fatigue Patient left: in chair;with call bell/phone within reach;with family/visitor present Nurse Communication: Mobility status PT Visit Diagnosis: Other abnormalities of gait and mobility (R26.89);Muscle weakness (generalized) (M62.81);History of falling (Z91.81);Pain Pain - Right/Left: Left Pain - part of body: Hip  Time: 1405-1430 PT Time Calculation (min) (ACUTE ONLY): 25 min  Charges:    $Therapeutic Activity: 23-37 mins PT General Charges $$ ACUTE PT VISIT: 1 Visit                     Felecia Shelling  PTA Acute  Rehabilitation Services Office M-F          [phone]

## 2023-10-10 DIAGNOSIS — S32592A Other specified fracture of left pubis, initial encounter for closed fracture: Secondary | ICD-10-CM | POA: Diagnosis not present

## 2023-10-10 DIAGNOSIS — S32591A Other specified fracture of right pubis, initial encounter for closed fracture: Secondary | ICD-10-CM | POA: Diagnosis not present

## 2023-10-10 LAB — BASIC METABOLIC PANEL WITH GFR
Anion gap: 6 (ref 5–15)
BUN: 13 mg/dL (ref 8–23)
CO2: 23 mmol/L (ref 22–32)
Calcium: 8 mg/dL — ABNORMAL LOW (ref 8.9–10.3)
Chloride: 97 mmol/L — ABNORMAL LOW (ref 98–111)
Creatinine, Ser: 0.3 mg/dL — ABNORMAL LOW (ref 0.44–1.00)
GFR, Estimated: >60 mL/min (ref >60)
Glucose, Bld: 96 mg/dL (ref 70–99)
Potassium: 3.8 mmol/L (ref 3.5–5.1)
Sodium: 126 mmol/L — ABNORMAL LOW (ref 135–145)

## 2023-10-10 LAB — CULTURE, BLOOD (ROUTINE X 2)
Culture: NO GROWTH
Culture: NO GROWTH

## 2023-10-10 MED ORDER — TRAMADOL HCL 50 MG PO TABS
50.0000 mg | ORAL_TABLET | Freq: Two times a day (BID) | ORAL | Status: DC
  Administered 2023-10-10 – 2023-10-11 (×3): 50 mg via ORAL
  Filled 2023-10-10 (×3): qty 1

## 2023-10-10 NOTE — Progress Notes (Signed)
 PROGRESS NOTE    [name]  NWG:[id number] DOB: May 23, 1935 DOA: 10/05/2023 PCP: [name], MD    Brief Narrative:  HPI: Natasha Harper is a 89 y.o. female with medical history significant for CAD s/p PCI, HTN, HLD, hypothyroidism, Hx of left hip fracture s/p ORIF 09/2021 who presented to the ED for evaluation of left hip pain after fall at home.   Patient states that she was getting up from her recliner earlier today when she slipped and fell onto her buttocks.  She had subsequent left hip/groin pain and was unable to stand or ambulate on her home.  She denied any head injury or loss of consciousness.  She denied associated lightheadedness, dizziness, chest pain, dyspnea.   Husband states that her ambulatory status has been diminished over the last 2 years.     Significant Imaging Studies: Left hip XR Equivocal findings for nondisplaced fractures through the left superior ramus and left inferior ramus/ischium. CT may be useful for further evaluation. 2. Extensive subcutaneous edema over the lateral aspect of the left hip, compatible with contusion after fall.  3. Stable ORIF of a prior healed intertrochanteric left hip fracture. 4. Stable bilateral hip osteoarthritis. CXR Low lung volumes. No acute airspace disease.  Left femur XR Equivocal findings for nondisplaced left superior and inferior pubic rami fractures. CT recommended. Please see left hip exam for complete characterization. 2. Prior ORIF of a healed intertrochanteric left hip fracture. Otherwise unremarkable left femur.  3. Soft tissue swelling overlying the lateral aspect left hip, consistent with contusion. CT head No acute intracranial process. 2. Stable atrophy and chronic small vessel ischemic changes. CT c-spine No acute cervical spine fracture.  CT chest/abd/pelvis No acute intrathoracic pathology. 2. Progression of T5 compression fracture since the prior CT. Correlation with clinical exam and point tenderness  recommended. 3. Bilateral superior and inferior pubic rami fractures. 4. Sigmoid diverticulosis. No bowel obstruction. Normal appendix. 5. Bilateral inguinal hernias containing short segments of small bowel, left greater than right. No obstruction. 6. Multiple bilateral breast nodules.    Assessment and Plan: Hyponatremia-- ? Poor PO intake -? Volume overload -IV lasix x 1  -added Na tabs with improvement -BMP in AM   Bilateral pubic rami fractures, closed, initial encounter (HCC) -PT/OT-SNF - Non-operative fracture.  -Will need outpatient referral to [facility] after discharge for f/u. -pain control-- added lidocaine patch and scheduled tylenol- also added scheduled ultram  Hypoxia -doubt PE. Likely due to pain meds. Wean to RA. Not on home O2. -incentive spirometry  UTI vs pna - IV Rocephin. -culture NGTD  Fall at home, initial encounter -pt does not leave the home per pt and her husband. Does not use walker or cane at home.   Primary hypertension - husband states pt not taking any BP meds at home. -add BP meds and control pain  Hypothyroidism -continue synthroid  Dyslipidemia -continue lipitor.  CAD (coronary artery disease) - stable.on ASA and lipitor.     Would recommend palliative care follow at SNF     DVT prophylaxis: enoxaparin (LOVENOX) injection 40 mg Start: 10/06/23 2200    Code Status: Full Code Family Communication: at bedside  Disposition Plan:  Level of care: Med-Surg Status is: Inpatient     Consultants:    Subjective: Afraid to eat cause then she will have to have a BM and be moved  Objective: Vitals:   10/09/23 1328 10/09/23 2007 10/09/23 2107 10/10/23 0413  BP: 134/70 (!) 162/90  Marland Kitchen)  166/70  Pulse: 72 79  68  Resp: 18 20  17   Temp: 98.1 F (36.7 C) (!) 97.5 F (36.4 C) 98 F (36.7 C) 98.6 F (37 C)  TempSrc: Oral Oral Oral Axillary  SpO2: 97% 97%  99%  Weight:      Height:        Intake/Output Summary (Last 24  hours) at 10/10/2023 1125 Last data filed at 10/10/2023 0900 Gross per 24 hour  Intake 100 ml  Output 1100 ml  Net -1000 ml   Filed Weights   10/05/23 2015  Weight: 64.2 kg    Examination:   General: Appearance:     Overweight female in no acute distress-- very hard of hearing     Lungs:     respirations unlabored, on [place]  Heart:    Normal heart rate.    MS:   All extremities are intact.    Neurologic:   Awake, alert       Data Reviewed: I have personally reviewed following labs and imaging studies  CBC: Recent Labs  Lab 10/05/23 1626 10/06/23 0432 10/08/23 0427 10/09/23 0414  WBC 7.8 5.6 6.0 5.5  NEUTROABS 6.9  --   --   --   HGB 11.3* 9.4* 8.8* 9.0*  HCT 35.5* 29.4* 28.1* 28.0*  MCV 108.6* 109.7* 111.5* 108.1*  PLT 212 160 132* 139*   Basic Metabolic Panel: Recent Labs  Lab 10/06/23 0432 10/08/23 0427 10/09/23 0414 10/09/23 1433 10/10/23 0633  NA 134* 130* 125* 123* 126*  K 3.9 3.7 3.8 3.9 3.8  CL 105 103 96* 93* 97*  CO2 22 21* 19* 20* 23  GLUCOSE 103* 95 91 153* 96  BUN 22 18 16 17 13   CREATININE <0.30* 0.44 <0.30* 0.57 0.30*  CALCIUM 8.3* 7.9* 8.3* 8.0* 8.0*   GFR: Estimated Creatinine Clearance: 43.4 mL/min (A) (by C-G formula based on SCr of 0.3 mg/dL (L)). Liver Function Tests: Recent Labs  Lab 10/05/23 1626  AST 31  ALT 37  ALKPHOS 89  BILITOT 0.4  PROT 6.5  ALBUMIN 3.6   No results for input(s): "LIPASE", "AMYLASE" in the last 168 hours. No results for input(s): "AMMONIA" in the last 168 hours. Coagulation Profile: Recent Labs  Lab 10/05/23 1626  INR 1.2   Cardiac Enzymes: No results for input(s): "CKTOTAL", "CKMB", "CKMBINDEX", "TROPONINI" in the last 168 hours. BNP (last 3 results) No results for input(s): "PROBNP" in the last 8760 hours. HbA1C: No results for input(s): "HGBA1C" in the last 72 hours. CBG: No results for input(s): "GLUCAP" in the last 168 hours. Lipid Profile: No results for input(s): "CHOL", "HDL",  "LDLCALC", "TRIG", "CHOLHDL", "LDLDIRECT" in the last 72 hours. Thyroid Function Tests: No results for input(s): "TSH", "T4TOTAL", "FREET4", "T3FREE", "THYROIDAB" in the last 72 hours. Anemia Panel: No results for input(s): "VITAMINB12", "FOLATE", "FERRITIN", "TIBC", "IRON", "RETICCTPCT" in the last 72 hours. Sepsis Labs: Recent Labs  Lab 10/05/23 1654  LATICACIDVEN 1.1    Recent Results (from the past 240 hours)  Resp panel by RT-PCR (RSV, Flu A&B, Covid) Anterior Nasal Swab     Status: None   Collection Time: 10/05/23  4:26 PM   Specimen: Anterior Nasal Swab  Result Value Ref Range Status   SARS Coronavirus 2 by RT PCR NEGATIVE NEGATIVE Final    Comment: (NOTE) SARS-CoV-2 target nucleic acids are NOT DETECTED.  The SARS-CoV-2 RNA is generally detectable in upper respiratory specimens during the acute phase of infection. The lowest concentration of SARS-CoV-2  viral copies this assay can detect is 138 copies/mL. A negative result does not preclude SARS-Cov-2 infection and should not be used as the sole basis for treatment or other patient management decisions. A negative result may occur with  improper specimen collection/handling, submission of specimen other than nasopharyngeal swab, presence of viral mutation(s) within the areas targeted by this assay, and inadequate number of viral copies(<138 copies/mL). A negative result must be combined with clinical observations, patient history, and epidemiological information. The expected result is Negative.  Fact Sheet for Patients:  BloggerCourse.com  Fact Sheet for Healthcare Providers:  SeriousBroker.it  This test is no t yet approved or cleared by the [place] FDA and  has been authorized for detection and/or diagnosis of SARS-CoV-2 by FDA under an Emergency Use Authorization (EUA). This EUA will remain  in effect (meaning this test can be used) for the duration of  the COVID-19 declaration under Section 564(b)(1) of the Act, 21 U.S.C.section 360bbb-3(b)(1), unless the authorization is terminated  or revoked sooner.       Influenza A by PCR NEGATIVE NEGATIVE Final   Influenza B by PCR NEGATIVE NEGATIVE Final    Comment: (NOTE) The Xpert Xpress SARS-CoV-2/FLU/RSV plus assay is intended as an aid in the diagnosis of influenza from Nasopharyngeal swab specimens and should not be used as a sole basis for treatment. Nasal washings and aspirates are unacceptable for Xpert Xpress SARS-CoV-2/FLU/RSV testing.  Fact Sheet for Patients: BloggerCourse.com  Fact Sheet for Healthcare Providers: SeriousBroker.it  This test is not yet approved or cleared by the [place] FDA and has been authorized for detection and/or diagnosis of SARS-CoV-2 by FDA under an Emergency Use Authorization (EUA). This EUA will remain in effect (meaning this test can be used) for the duration of the COVID-19 declaration under Section 564(b)(1) of the Act, 21 U.S.C. section 360bbb-3(b)(1), unless the authorization is terminated or revoked.     Resp Syncytial Virus by PCR NEGATIVE NEGATIVE Final    Comment: (NOTE) Fact Sheet for Patients: BloggerCourse.com  Fact Sheet for Healthcare Providers: SeriousBroker.it  This test is not yet approved or cleared by the [place] FDA and has been authorized for detection and/or diagnosis of SARS-CoV-2 by FDA under an Emergency Use Authorization (EUA). This EUA will remain in effect (meaning this test can be used) for the duration of the COVID-19 declaration under Section 564(b)(1) of the Act, 21 U.S.C. section 360bbb-3(b)(1), unless the authorization is terminated or revoked.  Performed at [facility], [address] [place], [place] [address]   Blood Culture (routine x 2)     Status: None   Collection  Time: 10/05/23  4:33 PM   Specimen: BLOOD  Result Value Ref Range Status   Specimen Description   Final    BLOOD RIGHT ANTECUBITAL Performed at [facility], [address], [place], [place] [address]    Special Requests   Final    BOTTLES DRAWN AEROBIC AND ANAEROBIC Blood Culture results may not be optimal due to an inadequate volume of blood received in culture bottles Performed at [facility], [address] [place], [place] [address]    Culture   Final    NO GROWTH 5 DAYS Performed at [facility], [address], [place], [place] [address]    Report Status 10/10/2023 FINAL  Final  Blood Culture (routine x 2)     Status: None   Collection Time: 10/05/23  4:50 PM   Specimen:  BLOOD RIGHT HAND  Result Value Ref Range Status   Specimen Description   Final    BLOOD RIGHT HAND Performed at [facility], [address] [place], [place] [address]    Special Requests   Final    BOTTLES DRAWN AEROBIC AND ANAEROBIC Blood Culture results may not be optimal due to an inadequate volume of blood received in culture bottles Performed at [facility], [address], [place], [place] [address]    Culture   Final    NO GROWTH 5 DAYS Performed at [facility], [address] [place], [place] [address]    Report Status 10/10/2023 FINAL  Final  Urine Culture     Status: Abnormal   Collection Time: 10/05/23  6:11 PM   Specimen: Urine, Clean Catch  Result Value Ref Range Status   Specimen Description   Final    URINE, CLEAN CATCH Performed at [facility], [address], [place], [place] [address]    Special Requests   Final    NONE Reflexed from [id number] Performed at [facility], [address], [place], [place] [address]    Culture MULTIPLE SPECIES PRESENT, SUGGEST RECOLLECTION (A)  Final   Report Status 10/06/2023 FINAL  Final         Radiology Studies: No results  found.       Scheduled Meds:  acetaminophen  1,000 mg Oral Q8H   amLODipine  5 mg Oral Daily   aspirin EC  325 mg Oral Q breakfast   atorvastatin  80 mg Oral Daily   enoxaparin (LOVENOX) injection  40 mg Subcutaneous Q24H   levothyroxine  50 mcg Oral Q0600   lidocaine  1 patch Transdermal Q24H   metoprolol tartrate  25 mg Oral BID   sodium bicarbonate  650 mg Oral Daily   traMADol  50 mg Oral Q12H   Continuous Infusions:     LOS: 4 days    Time spent: 45 minutes spent on chart review, discussion with nursing staff, consultants, updating family and interview/physical exam; more than 50% of that time was spent in counseling and/or coordination of care.    Joseph Art, DO [facility] Available via Epic secure chat 7am-7pm After these hours, please refer to coverage provider listed on [email or web address] 10/10/2023, 11:25 AM

## 2023-10-10 NOTE — Plan of Care (Signed)

## 2023-10-11 DIAGNOSIS — S32591A Other specified fracture of right pubis, initial encounter for closed fracture: Secondary | ICD-10-CM | POA: Diagnosis not present

## 2023-10-11 DIAGNOSIS — S32592A Other specified fracture of left pubis, initial encounter for closed fracture: Secondary | ICD-10-CM | POA: Diagnosis not present

## 2023-10-11 LAB — BASIC METABOLIC PANEL WITH GFR
Anion gap: 8 (ref 5–15)
BUN: 14 mg/dL (ref 8–23)
CO2: 23 mmol/L (ref 22–32)
Calcium: 8.2 mg/dL — ABNORMAL LOW (ref 8.9–10.3)
Chloride: 97 mmol/L — ABNORMAL LOW (ref 98–111)
Creatinine, Ser: 0.36 mg/dL — ABNORMAL LOW (ref 0.44–1.00)
GFR, Estimated: >60 mL/min (ref >60)
Glucose, Bld: 104 mg/dL — ABNORMAL HIGH (ref 70–99)
Potassium: 3.7 mmol/L (ref 3.5–5.1)
Sodium: 128 mmol/L — ABNORMAL LOW (ref 135–145)

## 2023-10-11 MED ORDER — TRAMADOL HCL 50 MG PO TABS
50.0000 mg | ORAL_TABLET | Freq: Three times a day (TID) | ORAL | Status: DC
  Administered 2023-10-11 – 2023-10-13 (×5): 50 mg via ORAL
  Filled 2023-10-11 (×5): qty 1

## 2023-10-11 NOTE — Plan of Care (Signed)

## 2023-10-11 NOTE — Progress Notes (Signed)
 PROGRESS NOTE    Natasha Harper  WGN:[id number] DOB: [birth year] DOA: 10/05/2023 PCP: [name], MD    Brief Narrative:  HPI: Natasha Harper is a 89 y.o. female with medical history significant for CAD s/p PCI, HTN, HLD, hypothyroidism, Hx of left hip fracture s/p ORIF 09/2021 who presented to the ED for evaluation of left hip pain after fall at home.   Patient states that she was getting up from her recliner earlier today when she slipped and fell onto her buttocks.  She had subsequent left hip/groin pain and was unable to stand or ambulate on her home.  She denied any head injury or loss of consciousness.  She denied associated lightheadedness, dizziness, chest pain, dyspnea.   Husband states that her ambulatory status has been diminished over the last 2 years.     Significant Imaging Studies: Left hip XR Equivocal findings for nondisplaced fractures through the left superior ramus and left inferior ramus/ischium. CT may be useful for further evaluation. 2. Extensive subcutaneous edema over the lateral aspect of the left hip, compatible with contusion after fall.  3. Stable ORIF of a prior healed intertrochanteric left hip fracture. 4. Stable bilateral hip osteoarthritis. CXR Low lung volumes. No acute airspace disease.  Left femur XR Equivocal findings for nondisplaced left superior and inferior pubic rami fractures. CT recommended. Please see left hip exam for complete characterization. 2. Prior ORIF of a healed intertrochanteric left hip fracture. Otherwise unremarkable left femur.  3. Soft tissue swelling overlying the lateral aspect left hip, consistent with contusion. CT head No acute intracranial process. 2. Stable atrophy and chronic small vessel ischemic changes. CT c-spine No acute cervical spine fracture.  CT chest/abd/pelvis No acute intrathoracic pathology. 2. Progression of T5 compression fracture since the prior CT. Correlation with clinical exam and point tenderness  recommended. 3. Bilateral superior and inferior pubic rami fractures. 4. Sigmoid diverticulosis. No bowel obstruction. Normal appendix. 5. Bilateral inguinal hernias containing short segments of small bowel, left greater than right. No obstruction. 6. Multiple bilateral breast nodules.    Assessment and Plan: Hyponatremia-- ? Poor PO intake -? Volume overload -IV lasix x 1  -added Na tabs with improvement -BMP in AM   Bilateral pubic rami fractures, closed, initial encounter (HCC) -PT/OT-SNF - Non-operative fracture.  -Will need outpatient referral to [facility] after discharge for f/u. -pain control-- added lidocaine patch and scheduled tylenol- also added scheduled ultram with improvement  Hypoxia -doubt PE. Likely due to pain meds. Wean to RA. Not on home O2. -incentive spirometry  UTI vs pna - IV Rocephin-- finished -culture NGTD  Fall at home, initial encounter -pt does not leave the home per pt and her husband. Does not use walker or cane at home.   Primary hypertension - husband states pt not taking any BP meds at home. -add BP meds and control pain  Hypothyroidism -continue synthroid  Dyslipidemia -continue lipitor.  CAD (coronary artery disease) - stable.on ASA and lipitor.     Would recommend palliative care follow at SNF     DVT prophylaxis: enoxaparin (LOVENOX) injection 40 mg Start: 10/06/23 2200    Code Status: Full Code Family Communication: at bedside  Disposition Plan:  Level of care: Med-Surg Status is: Inpatient     Consultants:    Subjective: No SOB, no CP-- pain improved  Objective: Vitals:   10/10/23 0413 10/10/23 2010 10/11/23 0551 10/11/23 0916  BP: (!) 166/70 137/61 (!) 170/78 (!) 145/62  Pulse: 68 73  76 82  Resp: 17 (!) 22 (!) 22   Temp: 98.6 F (37 C) 98 F (36.7 C) 98.3 F (36.8 C)   TempSrc: Axillary Oral Oral   SpO2: 99% 90% 90%   Weight:      Height:       No intake or output data in the 24 hours  ending 10/11/23 1159  Filed Weights   10/05/23 2015  Weight: 64.2 kg    Examination:   General: Appearance:     Overweight female in no acute distress-- very hard of hearing     Lungs:     respirations unlabored, on [place]  Heart:    Normal heart rate.    MS:   All extremities are intact.    Neurologic:   Awake, alert       Data Reviewed: I have personally reviewed following labs and imaging studies  CBC: Recent Labs  Lab 10/05/23 1626 10/06/23 0432 10/08/23 0427 10/09/23 0414  WBC 7.8 5.6 6.0 5.5  NEUTROABS 6.9  --   --   --   HGB 11.3* 9.4* 8.8* 9.0*  HCT 35.5* 29.4* 28.1* 28.0*  MCV 108.6* 109.7* 111.5* 108.1*  PLT 212 160 132* 139*   Basic Metabolic Panel: Recent Labs  Lab 10/08/23 0427 10/09/23 0414 10/09/23 1433 10/10/23 0633 10/11/23 0411  NA 130* 125* 123* 126* 128*  K 3.7 3.8 3.9 3.8 3.7  CL 103 96* 93* 97* 97*  CO2 21* 19* 20* 23 23  GLUCOSE 95 91 153* 96 104*  BUN 18 16 17 13 14   CREATININE 0.44 <0.30* 0.57 0.30* 0.36*  CALCIUM 7.9* 8.3* 8.0* 8.0* 8.2*   GFR: Estimated Creatinine Clearance: 43.4 mL/min (A) (by C-G formula based on SCr of 0.36 mg/dL (L)). Liver Function Tests: Recent Labs  Lab 10/05/23 1626  AST 31  ALT 37  ALKPHOS 89  BILITOT 0.4  PROT 6.5  ALBUMIN 3.6   No results for input(s): "LIPASE", "AMYLASE" in the last 168 hours. No results for input(s): "AMMONIA" in the last 168 hours. Coagulation Profile: Recent Labs  Lab 10/05/23 1626  INR 1.2   Cardiac Enzymes: No results for input(s): "CKTOTAL", "CKMB", "CKMBINDEX", "TROPONINI" in the last 168 hours. BNP (last 3 results) No results for input(s): "PROBNP" in the last 8760 hours. HbA1C: No results for input(s): "HGBA1C" in the last 72 hours. CBG: No results for input(s): "GLUCAP" in the last 168 hours. Lipid Profile: No results for input(s): "CHOL", "HDL", "LDLCALC", "TRIG", "CHOLHDL", "LDLDIRECT" in the last 72 hours. Thyroid Function Tests: No results for  input(s): "TSH", "T4TOTAL", "FREET4", "T3FREE", "THYROIDAB" in the last 72 hours. Anemia Panel: No results for input(s): "VITAMINB12", "FOLATE", "FERRITIN", "TIBC", "IRON", "RETICCTPCT" in the last 72 hours. Sepsis Labs: Recent Labs  Lab 10/05/23 1654  LATICACIDVEN 1.1    Recent Results (from the past 240 hours)  Resp panel by RT-PCR (RSV, Flu A&B, Covid) Anterior Nasal Swab     Status: None   Collection Time: 10/05/23  4:26 PM   Specimen: Anterior Nasal Swab  Result Value Ref Range Status   SARS Coronavirus 2 by RT PCR NEGATIVE NEGATIVE Final    Comment: (NOTE) SARS-CoV-2 target nucleic acids are NOT DETECTED.  The SARS-CoV-2 RNA is generally detectable in upper respiratory specimens during the acute phase of infection. The lowest concentration of SARS-CoV-2 viral copies this assay can detect is 138 copies/mL. A negative result does not preclude SARS-Cov-2 infection and should not be used as the sole basis for  treatment or other patient management decisions. A negative result may occur with  improper specimen collection/handling, submission of specimen other than nasopharyngeal swab, presence of viral mutation(s) within the areas targeted by this assay, and inadequate number of viral copies(<138 copies/mL). A negative result must be combined with clinical observations, patient history, and epidemiological information. The expected result is Negative.  Fact Sheet for Patients:  BloggerCourse.com  Fact Sheet for Healthcare Providers:  SeriousBroker.it  This test is no t yet approved or cleared by the [place] FDA and  has been authorized for detection and/or diagnosis of SARS-CoV-2 by FDA under an Emergency Use Authorization (EUA). This EUA will remain  in effect (meaning this test can be used) for the duration of the COVID-19 declaration under Section 564(b)(1) of the Act, 21 U.S.C.section 360bbb-3(b)(1), unless the  authorization is terminated  or revoked sooner.       Influenza A by PCR NEGATIVE NEGATIVE Final   Influenza B by PCR NEGATIVE NEGATIVE Final    Comment: (NOTE) The Xpert Xpress SARS-CoV-2/FLU/RSV plus assay is intended as an aid in the diagnosis of influenza from Nasopharyngeal swab specimens and should not be used as a sole basis for treatment. Nasal washings and aspirates are unacceptable for Xpert Xpress SARS-CoV-2/FLU/RSV testing.  Fact Sheet for Patients: BloggerCourse.com  Fact Sheet for Healthcare Providers: SeriousBroker.it  This test is not yet approved or cleared by the [place] FDA and has been authorized for detection and/or diagnosis of SARS-CoV-2 by FDA under an Emergency Use Authorization (EUA). This EUA will remain in effect (meaning this test can be used) for the duration of the COVID-19 declaration under Section 564(b)(1) of the Act, 21 U.S.C. section 360bbb-3(b)(1), unless the authorization is terminated or revoked.     Resp Syncytial Virus by PCR NEGATIVE NEGATIVE Final    Comment: (NOTE) Fact Sheet for Patients: BloggerCourse.com  Fact Sheet for Healthcare Providers: SeriousBroker.it  This test is not yet approved or cleared by the [place] FDA and has been authorized for detection and/or diagnosis of SARS-CoV-2 by FDA under an Emergency Use Authorization (EUA). This EUA will remain in effect (meaning this test can be used) for the duration of the COVID-19 declaration under Section 564(b)(1) of the Act, 21 U.S.C. section 360bbb-3(b)(1), unless the authorization is terminated or revoked.  Performed at [facility], [address] [place], [place] [address]   Blood Culture (routine x 2)     Status: None   Collection Time: 10/05/23  4:33 PM   Specimen: BLOOD  Result Value Ref Range Status   Specimen Description   Final     BLOOD RIGHT ANTECUBITAL Performed at [facility], [address], [place], [place] [address]    Special Requests   Final    BOTTLES DRAWN AEROBIC AND ANAEROBIC Blood Culture results may not be optimal due to an inadequate volume of blood received in culture bottles Performed at [facility], [address] [place], [place] [address]    Culture   Final    NO GROWTH 5 DAYS Performed at [facility], [address], [place], [place] [address]    Report Status 10/10/2023 FINAL  Final  Blood Culture (routine x 2)     Status: None   Collection Time: 10/05/23  4:50 PM   Specimen: BLOOD RIGHT HAND  Result Value Ref Range Status   Specimen Description   Final    BLOOD RIGHT HAND Performed at [facility], [address], [place], [place] [address]    Special Requests   Final    BOTTLES DRAWN AEROBIC AND ANAEROBIC Blood Culture results may not be optimal due to an inadequate volume of blood received in culture bottles Performed at [facility], [address], [place], [place] [address]    Culture   Final    NO GROWTH 5 DAYS Performed at [facility], [address], [place], [place] [address]    Report Status 10/10/2023 FINAL  Final  Urine Culture     Status: Abnormal   Collection Time: 10/05/23  6:11 PM   Specimen: Urine, Clean Catch  Result Value Ref Range Status   Specimen Description   Final    URINE, CLEAN CATCH Performed at [facility], [address] [place], [place] [address]    Special Requests   Final    NONE Reflexed from [id number] Performed at [facility], [address] [place], [place] [address]    Culture MULTIPLE SPECIES PRESENT, SUGGEST RECOLLECTION (A)  Final   Report Status 10/06/2023 FINAL  Final         Radiology Studies: No results found.       Scheduled Meds:  acetaminophen  1,000 mg Oral Q8H   amLODipine  5 mg Oral Daily   aspirin EC   325 mg Oral Q breakfast   atorvastatin  80 mg Oral Daily   enoxaparin (LOVENOX) injection  40 mg Subcutaneous Q24H   levothyroxine  50 mcg Oral Q0600   lidocaine  1 patch Transdermal Q24H   metoprolol tartrate  25 mg Oral BID   sodium bicarbonate  650 mg Oral Daily   traMADol  50 mg Oral Q8H   Continuous Infusions:     LOS: 5 days    Time spent: 45 minutes spent on chart review, discussion with nursing staff, consultants, updating family and interview/physical exam; more than 50% of that time was spent in counseling and/or coordination of care.    Joseph Art, DO [facility] Available via Epic secure chat 7am-7pm After these hours, please refer to coverage provider listed on [email or web address] 10/11/2023, 11:59 AM

## 2023-10-12 DIAGNOSIS — S32591A Other specified fracture of right pubis, initial encounter for closed fracture: Secondary | ICD-10-CM | POA: Diagnosis not present

## 2023-10-12 DIAGNOSIS — S32592A Other specified fracture of left pubis, initial encounter for closed fracture: Secondary | ICD-10-CM | POA: Diagnosis not present

## 2023-10-12 LAB — BASIC METABOLIC PANEL WITH GFR
Anion gap: 10 (ref 5–15)
BUN: 15 mg/dL (ref 8–23)
CO2: 21 mmol/L — ABNORMAL LOW (ref 22–32)
Calcium: 8.3 mg/dL — ABNORMAL LOW (ref 8.9–10.3)
Chloride: 95 mmol/L — ABNORMAL LOW (ref 98–111)
Creatinine, Ser: 0.33 mg/dL — ABNORMAL LOW (ref 0.44–1.00)
GFR, Estimated: >60 mL/min (ref >60)
Glucose, Bld: 95 mg/dL (ref 70–99)
Potassium: 3.7 mmol/L (ref 3.5–5.1)
Sodium: 126 mmol/L — ABNORMAL LOW (ref 135–145)

## 2023-10-12 MED ORDER — SODIUM CHLORIDE 1 G PO TABS
1.0000 g | ORAL_TABLET | Freq: Two times a day (BID) | ORAL | Status: DC
  Administered 2023-10-12 – 2023-10-13 (×2): 1 g via ORAL
  Filled 2023-10-12 (×2): qty 1

## 2023-10-12 MED ORDER — AMLODIPINE BESYLATE 5 MG PO TABS: 5.0000 mg | ORAL_TABLET | Freq: Every day | ORAL | Status: DC

## 2023-10-12 MED ORDER — TRAMADOL HCL 50 MG PO TABS: 50.0000 mg | ORAL_TABLET | Freq: Three times a day (TID) | ORAL | 0 refills | Status: DC

## 2023-10-12 MED ORDER — METOPROLOL TARTRATE 25 MG PO TABS: 25.0000 mg | ORAL_TABLET | Freq: Two times a day (BID) | ORAL | Status: DC

## 2023-10-12 MED ORDER — FUROSEMIDE 40 MG PO TABS: 40.0000 mg | ORAL_TABLET | Freq: Once | ORAL | Status: DC

## 2023-10-12 MED ORDER — ALBUTEROL SULFATE (2.5 MG/3ML) 0.083% IN NEBU: 2.5000 mg | INHALATION_SOLUTION | Freq: Four times a day (QID) | RESPIRATORY_TRACT | Status: DC | PRN

## 2023-10-12 MED ORDER — OXYCODONE HCL 5 MG PO TABS: 5.0000 mg | ORAL_TABLET | ORAL | 0 refills | Status: DC | PRN

## 2023-10-12 MED ORDER — SODIUM CHLORIDE 1 G PO TABS: 1.0000 g | ORAL_TABLET | Freq: Two times a day (BID) | ORAL | Status: AC

## 2023-10-12 MED ORDER — ACETAMINOPHEN 500 MG PO TABS: 1000.0000 mg | ORAL_TABLET | Freq: Three times a day (TID) | ORAL | Status: DC

## 2023-10-12 NOTE — TOC Progression Note (Signed)
 Transition of Care [facility]) - Progression Note    Patient Details  Name: TALEIGH GERO MRN: [id number] Date of Birth: 03/18/1935  [facility] CM/SW Contact  Diona Browner, [place] Phone Number: 10/12/2023, 3:48 PM  Clinical Narrative:    Pt ins auth expired and will require new ins auth. CSW notified PT dept that pt will need an updated PT note in order to request ins auth for SNF. TOC following.   Expected Discharge Plan: Home/Self Care Barriers to Discharge: Continued Medical Work up  Expected Discharge Plan and Services   Discharge Planning Services: CM Consult   Living arrangements for the past 2 months: Single Family Home Expected Discharge Date: 10/12/23                                     Social Determinants of Health (SDOH) Interventions SDOH Screenings   Food Insecurity: No Food Insecurity (10/05/2023)  Housing: Unknown (10/05/2023)  Transportation Needs: No Transportation Needs (10/05/2023)  Utilities: Patient Declined (10/05/2023)  Depression (PHQ2-9): Low Risk  (01/28/2022)  Social Connections: Socially Isolated (10/05/2023)  Tobacco Use: Medium Risk (10/05/2023)    Readmission Risk Interventions    07/17/2021    3:01 PM  Readmission Risk Prevention Plan  Transportation Screening Complete  PCP or Specialist Appt within 3-5 Days Not Complete  Not Complete comments Plan to d/c to [facility] Inpatient rehab  HRI or Home Care Consult Not Complete  HRI or Home Care Consult comments Patient to d/c to [facility] Inpatient rehab  Social Work Consult for Recovery Care Planning/Counseling Complete  Palliative Care Screening Not Applicable  Medication Review (RN Care Manager) Referral to Pharmacy

## 2023-10-12 NOTE — Plan of Care (Signed)

## 2023-10-12 NOTE — Care Management Important Message (Signed)
 Important Message  Patient Details IM Letter given to the Patient Name: Natasha Harper MRN: [id number] Date of Birth: 12-Apr-1935   Important Message Given:  Yes - Medicare IM     Caren Macadam 10/12/2023, 11:01 AM

## 2023-10-12 NOTE — Discharge Summary (Signed)
 Physician Discharge Summary  Natasha Harper ZOX:[id number] DOB: 28-Apr-1935 DOA: 10/05/2023  PCP: [name], MD  Admit date: 10/05/2023 Discharge date: 10/12/2023  Admitted From: home Discharge disposition: SNF   Recommendations for Outpatient Follow-Up:   BMP 1 week Salt tabs x 1 week then recheck BMP Wean O2 as able Schedule tramadol/tylenol x 1 week then change to PRN referral to [facility] after discharge for f/u Monitor BP and adjust BP meds Would recommend palliative care consult if not improving   Discharge Diagnosis:   Principal Problem:   Bilateral pubic rami fractures, closed, initial encounter [facility]) Active Problems:   Fall at home, initial encounter   Acute cystitis without hematuria   Hypoxia   CAD (coronary artery disease)   Dyslipidemia   Hypothyroidism   Primary hypertension    Discharge Condition: stable  Diet recommendation:   Regular.  Wound care: None.  Code status: Full.   History of Present Illness:   Natasha Harper is a 89 y.o. female with medical history significant for CAD s/p PCI, HTN, HLD, hypothyroidism, Hx of left hip fracture s/p ORIF 09/2021 who presented to the ED for evaluation of left hip pain after fall at home.   Patient states that she was getting up from her recliner earlier today when she slipped and fell onto her buttocks.  She had subsequent left hip/groin pain and was unable to stand or ambulate on her home.  She denied any head injury or loss of consciousness.  She denied associated lightheadedness, dizziness, chest pain, dyspnea.   Husband states that her ambulatory status has been diminished over the last 2 years.   Hospital Course by Problem:   Hyponatremia-- ? Poor PO intake -appears to have issues with low sodium during prior hospitalizations as well, in the past patient has been placed on salt tabs with improvement -IV lasix x 1  -added Na tabs-will need close follow-up at SNF -follow 1 week     Bilateral pubic rami fractures, closed, initial encounter (HCC) -PT/OT-SNF - Non-operative fracture.  -Will need outpatient referral to [facility] after discharge for f/u. -pain control-- added lidocaine patch and scheduled tylenol- also added scheduled ultram with improvement   Hypoxia -doubt PE. Likely due to pain meds. Wean to RA. Not on home O2. -incentive spirometry   UTI vs pna - IV Rocephin-- finished -culture NGTD   Fall at home, initial encounter -pt does not leave the home per pt and her husband. Does not use walker or cane at home.    Primary hypertension - husband states pt not taking any BP meds at home. -added BP meds and will need further adjustment   Hypothyroidism -continue synthroid   Dyslipidemia -continue lipitor.   CAD (coronary artery disease) - stable.on ASA and lipitor.      Medical Consultants:   Ortho (phone)   Discharge Exam:   Vitals:   10/11/23 1919 10/12/23 0422  BP: 131/65 (!) 163/73  Pulse: 77 75  Resp: 19 20  Temp: 97.8 F (36.6 C) 98 F (36.7 C)  SpO2: 91% 91%   Vitals:   10/11/23 0916 10/11/23 1541 10/11/23 1919 10/12/23 0422  BP: (!) 145/62 (!) 131/106 131/65 (!) 163/73  Pulse: 82 70 77 75  Resp:   19 20  Temp:  (!) 97.5 F (36.4 C) 97.8 F (36.6 C) 98 F (36.7 C)  TempSrc:  Oral Oral   SpO2:  90% 91% 91%  Weight:  Height:        General exam: Appears calm and comfortable. Pain better controlled- hard of hearing   The results of significant diagnostics from this hospitalization (including imaging, microbiology, ancillary and laboratory) are listed below for reference.     Procedures and Diagnostic Studies:   CT CHEST ABDOMEN PELVIS W CONTRAST Result Date: 10/05/2023 CLINICAL DATA:  Sepsis.  Fall and left hip pain. EXAM: CT CHEST, ABDOMEN, AND PELVIS WITH CONTRAST TECHNIQUE: Multidetector CT imaging of the chest, abdomen and pelvis was performed following the standard protocol during bolus  administration of intravenous contrast. RADIATION DOSE REDUCTION: This exam was performed according to the departmental dose-optimization program which includes automated exposure control, adjustment of the mA and/or kV according to patient size and/or use of iterative reconstruction technique. CONTRAST:  OMNIPAQUE IOHEXOL 300 MG/ML  SOLN COMPARISON:  Chest CT dated 07/05/2021. FINDINGS: Evaluation of this exam is limited due to respiratory motion. CT CHEST FINDINGS Cardiovascular: Mild cardiomegaly. No pericardial effusion. There is coronary vascular calcification. Moderate atherosclerotic calcification of the thoracic aorta. No aneurysmal dilatation or dissection. The origins of the great vessels of the aortic arch and the central pulmonary arteries appear patent. Mediastinum/Nodes: No hilar or mediastinal adenopathy. The esophagus is grossly unremarkable. No mediastinal fluid collection Lungs/Pleura: Bibasilar subpleural atelectasis. There is no pleural effusion or pneumothorax. The central airways are patent. Musculoskeletal: Osteopenia with degenerative changes of the spine. Old healed sternal fractures. Multilevel chronic appearing thoracic compression deformities. T6 vertebroplasty. Progression of T5 compression fracture since the prior CT. Correlation with clinical exam and point tenderness recommended. Evaluation for fracture is very limited due to severe osteoporosis. Multiple bilateral breast nodules. Correlation with clinical exam and mammographic studies as clinically indicated. CT ABDOMEN PELVIS FINDINGS No intra-abdominal free air or free fluid. Hepatobiliary: Small liver cyst. Mild biliary dilatation. The gallbladder is unremarkable Pancreas: Unremarkable. No pancreatic ductal dilatation or surrounding inflammatory changes. Spleen: Normal in size without focal abnormality. Adrenals/Urinary Tract: The adrenal glands are unremarkable. There is no hydronephrosis on either side. There is symmetric  enhancement and excretion of contrast by both kidneys. Small bilateral renal cysts. The visualized ureters appear unremarkable. The urinary bladder is grossly unremarkable. Stomach/Bowel: There is sigmoid diverticulosis with muscular hypertrophy. There is no bowel obstruction or active inflammation. The appendix is normal. Vascular/Lymphatic: Advanced aortoiliac atherosclerotic disease. The IVC is unremarkable. No portal venous gas. There is no adenopathy. Reproductive: The uterus is grossly unremarkable. No suspicious adnexal masses. Other: Bilateral inguinal hernias containing short segments of small bowel, left greater than right. No obstruction. Musculoskeletal: Status post prior ORIF of the left femoral neck. Minimally displaced fracture of the right superior pubic ramus and probable nondisplaced fracture of the right inferior pubic ramus. There is a nondisplaced fracture of the left pubic bone extending from the ischial tuberosity into the left inferior pubic ramus. Minimally displaced fracture of the left superior pubic ramus. There is small hematoma in the anterior pelvis. IMPRESSION: 1. No acute intrathoracic pathology. 2. Progression of T5 compression fracture since the prior CT. Correlation with clinical exam and point tenderness recommended. 3. Bilateral superior and inferior pubic rami fractures. 4. Sigmoid diverticulosis. No bowel obstruction. Normal appendix. 5. Bilateral inguinal hernias containing short segments of small bowel, left greater than right. No obstruction. 6. Multiple bilateral breast nodules. Electronically Signed   By: Elgie Collard M.D.   On: 10/05/2023 18:26   CT Head Wo Contrast Result Date: 10/05/2023 CLINICAL DATA:  Ataxia, head trauma. EXAM: CT HEAD WITHOUT  CONTRAST TECHNIQUE: Contiguous axial images were obtained from the base of the skull through the vertex without intravenous contrast. RADIATION DOSE REDUCTION: This exam was performed according to the departmental  dose-optimization program which includes automated exposure control, adjustment of the mA and/or kV according to patient size and/or use of iterative reconstruction technique. COMPARISON:  Head CT 07/05/2021 FINDINGS: Brain: No evidence of acute infarction, hemorrhage, hydrocephalus, extra-axial collection or mass lesion/mass effect. Again seen is mild diffuse atrophy and moderate periventricular white matter hypodensity, likely chronic small vessel ischemic change. Vascular: Atherosclerotic calcifications are present within the cavernous internal carotid arteries. Skull: Normal. Negative for fracture or focal lesion. Sinuses/Orbits: No acute finding. Other: None. IMPRESSION: 1. No acute intracranial process. 2. Stable atrophy and chronic small vessel ischemic changes. Electronically Signed   By: Darliss Cheney M.D.   On: 10/05/2023 18:24   CT Cervical Spine Wo Contrast Result Date: 10/05/2023 CLINICAL DATA:  Slipped and fell, ataxia, cervical trauma EXAM: CT CERVICAL SPINE WITHOUT CONTRAST TECHNIQUE: Multidetector CT imaging of the cervical spine was performed without intravenous contrast. Multiplanar CT image reconstructions were also generated. RADIATION DOSE REDUCTION: This exam was performed according to the departmental dose-optimization program which includes automated exposure control, adjustment of the mA and/or kV according to patient size and/or use of iterative reconstruction technique. COMPARISON:  07/05/2021 FINDINGS: Alignment: Normal. Skull base and vertebrae: No acute fracture. No primary bone lesion or focal pathologic process. Soft tissues and spinal canal: No prevertebral fluid or swelling. No visible canal hematoma. Disc levels: Mild spondylosis at C4-5, C5-6, and C6-7. No significant bony encroachment upon the central canal or neural foramina. Upper chest: Airway is patent. Visualized portions of the lung apices are clear. Other: Reconstructed images demonstrate no additional findings.  IMPRESSION: 1. No acute cervical spine fracture. Electronically Signed   By: Sharlet Salina M.D.   On: 10/05/2023 18:20   DG FEMUR MIN 2 VIEWS LEFT Result Date: 10/05/2023 CLINICAL DATA:  Left hip pain after fall, prior left hip fracture status post ORIF EXAM: LEFT FEMUR 2 VIEWS COMPARISON:  04/15/2023 FINDINGS: Frontal and cross-table lateral views of the left femur are obtained. Intramedullary rod with proximal dynamic screws traverse a prior healed intertrochanteric left hip fracture. Please refer to left hip study describing subtle lucencies through the left superior and inferior pubic rami suspicious for nondisplaced fractures. No evidence of acute femoral fracture. Mild osteoarthritis of the left hip and knee. Prominent soft tissue swelling overlying the lateral aspect of the left hip consistent with contusion. IMPRESSION: 1. Equivocal findings for nondisplaced left superior and inferior pubic rami fractures. CT recommended. Please see left hip exam for complete characterization. 2. Prior ORIF of a healed intertrochanteric left hip fracture. Otherwise unremarkable left femur. 3. Soft tissue swelling overlying the lateral aspect left hip, consistent with contusion. Electronically Signed   By: Sharlet Salina M.D.   On: 10/05/2023 16:42   DG Chest Portable 1 View Result Date: 10/05/2023 CLINICAL DATA:  Slipped and fell, cough EXAM: PORTABLE CHEST 1 VIEW COMPARISON:  07/05/2021 FINDINGS: Single frontal view of the chest demonstrates stable enlargement of the cardiac silhouette. Lung volumes are diminished, with chronic the pulmonary vasculature. No airspace disease, effusion, or pneumothorax. No acute displaced fracture. Prior midthoracic vertebral augmentation. Diffuse atherosclerosis. IMPRESSION: 1. Low lung volumes.  No acute airspace disease. Electronically Signed   By: Sharlet Salina M.D.   On: 10/05/2023 16:41   DG Hip Unilat With Pelvis 2-3 Views Left Result Date: 10/05/2023 CLINICAL DATA:  Slipped  and fell, left hip pain, inability to bear weight EXAM: DG HIP (WITH OR WITHOUT PELVIS) 2-3V LEFT COMPARISON:  04/15/2023 FINDINGS: Frontal view of the pelvis as well as frontal and cross-table lateral views of the left hip are obtained. The proximal extent of an intramedullary rod and 2 dynamic screws are seen traversing the left hip, without significant change since prior study. Heterotopic ossification adjacent to the left lesser trochanter. Prior healed left inferior pubic ramus fracture. There are subtle lucencies of the left superior pubic ramus and left inferior ramus extending to the ischium, best seen on the frontal view of the left hip. These are likely due to overlying skin folds are superimposed shadows, though minimally displaced fractures cannot be excluded in light of trauma and inability to bear weight. Further evaluation with CT may be useful. Stable mild bilateral hip osteoarthritis. Marked subcutaneous edema overlying the lateral aspect of the left hip. IMPRESSION: 1. Equivocal findings for nondisplaced fractures through the left superior ramus and left inferior ramus/ischium. CT may be useful for further evaluation. 2. Extensive subcutaneous edema over the lateral aspect of the left hip, compatible with contusion after fall. 3. Stable ORIF of a prior healed intertrochanteric left hip fracture. 4. Stable bilateral hip osteoarthritis. Electronically Signed   By: Sharlet Salina M.D.   On: 10/05/2023 16:40     Labs:   Basic Metabolic Panel: Recent Labs  Lab 10/08/23 0427 10/09/23 0414 10/09/23 1433 10/10/23 0633 10/11/23 0411  NA 130* 125* 123* 126* 128*  K 3.7 3.8 3.9 3.8 3.7  CL 103 96* 93* 97* 97*  CO2 21* 19* 20* 23 23  GLUCOSE 95 91 153* 96 104*  BUN 18 16 17 13 14   CREATININE 0.44 <0.30* 0.57 0.30* 0.36*  CALCIUM 7.9* 8.3* 8.0* 8.0* 8.2*   GFR Estimated Creatinine Clearance: 43.4 mL/min (A) (by C-G formula based on SCr of 0.36 mg/dL (L)). Liver Function Tests: Recent  Labs  Lab 10/05/23 1626  AST 31  ALT 37  ALKPHOS 89  BILITOT 0.4  PROT 6.5  ALBUMIN 3.6   No results for input(s): "LIPASE", "AMYLASE" in the last 168 hours. No results for input(s): "AMMONIA" in the last 168 hours. Coagulation profile Recent Labs  Lab 10/05/23 1626  INR 1.2    CBC: Recent Labs  Lab 10/05/23 1626 10/06/23 0432 10/08/23 0427 10/09/23 0414  WBC 7.8 5.6 6.0 5.5  NEUTROABS 6.9  --   --   --   HGB 11.3* 9.4* 8.8* 9.0*  HCT 35.5* 29.4* 28.1* 28.0*  MCV 108.6* 109.7* 111.5* 108.1*  PLT 212 160 132* 139*   Cardiac Enzymes: No results for input(s): "CKTOTAL", "CKMB", "CKMBINDEX", "TROPONINI" in the last 168 hours. BNP: Invalid input(s): "POCBNP" CBG: No results for input(s): "GLUCAP" in the last 168 hours. D-Dimer No results for input(s): "DDIMER" in the last 72 hours. Hgb A1c No results for input(s): "HGBA1C" in the last 72 hours. Lipid Profile No results for input(s): "CHOL", "HDL", "LDLCALC", "TRIG", "CHOLHDL", "LDLDIRECT" in the last 72 hours. Thyroid function studies No results for input(s): "TSH", "T4TOTAL", "T3FREE", "THYROIDAB" in the last 72 hours.  Invalid input(s): "FREET3" Anemia work up No results for input(s): "VITAMINB12", "FOLATE", "FERRITIN", "TIBC", "IRON", "RETICCTPCT" in the last 72 hours. Microbiology Recent Results (from the past 240 hours)  Resp panel by RT-PCR (RSV, Flu A&B, Covid) Anterior Nasal Swab     Status: None   Collection Time: 10/05/23  4:26 PM   Specimen: Anterior Nasal Swab  Result  Value Ref Range Status   SARS Coronavirus 2 by RT PCR NEGATIVE NEGATIVE Final    Comment: (NOTE) SARS-CoV-2 target nucleic acids are NOT DETECTED.  The SARS-CoV-2 RNA is generally detectable in upper respiratory specimens during the acute phase of infection. The lowest concentration of SARS-CoV-2 viral copies this assay can detect is 138 copies/mL. A negative result does not preclude SARS-Cov-2 infection and should not be used as  the sole basis for treatment or other patient management decisions. A negative result may occur with  improper specimen collection/handling, submission of specimen other than nasopharyngeal swab, presence of viral mutation(s) within the areas targeted by this assay, and inadequate number of viral copies(<138 copies/mL). A negative result must be combined with clinical observations, patient history, and epidemiological information. The expected result is Negative.  Fact Sheet for Patients:  BloggerCourse.com  Fact Sheet for Healthcare Providers:  SeriousBroker.it  This test is no t yet approved or cleared by the [place] FDA and  has been authorized for detection and/or diagnosis of SARS-CoV-2 by FDA under an Emergency Use Authorization (EUA). This EUA will remain  in effect (meaning this test can be used) for the duration of the COVID-19 declaration under Section 564(b)(1) of the Act, 21 U.S.C.section 360bbb-3(b)(1), unless the authorization is terminated  or revoked sooner.       Influenza A by PCR NEGATIVE NEGATIVE Final   Influenza B by PCR NEGATIVE NEGATIVE Final    Comment: (NOTE) The Xpert Xpress SARS-CoV-2/FLU/RSV plus assay is intended as an aid in the diagnosis of influenza from Nasopharyngeal swab specimens and should not be used as a sole basis for treatment. Nasal washings and aspirates are unacceptable for Xpert Xpress SARS-CoV-2/FLU/RSV testing.  Fact Sheet for Patients: BloggerCourse.com  Fact Sheet for Healthcare Providers: SeriousBroker.it  This test is not yet approved or cleared by the [place] FDA and has been authorized for detection and/or diagnosis of SARS-CoV-2 by FDA under an Emergency Use Authorization (EUA). This EUA will remain in effect (meaning this test can be used) for the duration of the COVID-19 declaration under Section 564(b)(1) of  the Act, 21 U.S.C. section 360bbb-3(b)(1), unless the authorization is terminated or revoked.     Resp Syncytial Virus by PCR NEGATIVE NEGATIVE Final    Comment: (NOTE) Fact Sheet for Patients: BloggerCourse.com  Fact Sheet for Healthcare Providers: SeriousBroker.it  This test is not yet approved or cleared by the [place] FDA and has been authorized for detection and/or diagnosis of SARS-CoV-2 by FDA under an Emergency Use Authorization (EUA). This EUA will remain in effect (meaning this test can be used) for the duration of the COVID-19 declaration under Section 564(b)(1) of the Act, 21 U.S.C. section 360bbb-3(b)(1), unless the authorization is terminated or revoked.  Performed at [facility], [address], [place], [place] [address]   Blood Culture (routine x 2)     Status: None   Collection Time: 10/05/23  4:33 PM   Specimen: BLOOD  Result Value Ref Range Status   Specimen Description   Final    BLOOD RIGHT ANTECUBITAL Performed at [facility], [address] [place], [place] [address]    Special Requests   Final    BOTTLES DRAWN AEROBIC AND ANAEROBIC Blood Culture results may not be optimal due to an inadequate volume of blood received in culture bottles Performed at [facility], [address] [place], [place] [address]    Culture   Final  NO GROWTH 5 DAYS Performed at [facility], [address], [place], [place] [address]    Report Status 10/10/2023 FINAL  Final  Blood Culture (routine x 2)     Status: None   Collection Time: 10/05/23  4:50 PM   Specimen: BLOOD RIGHT HAND  Result Value Ref Range Status   Specimen Description   Final    BLOOD RIGHT HAND Performed at [facility], [address] [place], [place] [address]    Special Requests   Final    BOTTLES DRAWN AEROBIC AND ANAEROBIC Blood Culture results may not be optimal  due to an inadequate volume of blood received in culture bottles Performed at [facility], [address], [place], [place] [address]    Culture   Final    NO GROWTH 5 DAYS Performed at [facility], [address], [place], [place] [address]    Report Status 10/10/2023 FINAL  Final  Urine Culture     Status: Abnormal   Collection Time: 10/05/23  6:11 PM   Specimen: Urine, Clean Catch  Result Value Ref Range Status   Specimen Description   Final    URINE, CLEAN CATCH Performed at [facility], [address], [place], [place] [address]    Special Requests   Final    NONE Reflexed from [id number] Performed at [facility], [address] [place], [place] [address]    Culture MULTIPLE SPECIES PRESENT, SUGGEST RECOLLECTION (A)  Final   Report Status 10/06/2023 FINAL  Final     Discharge Instructions:   Discharge Instructions     Diet general   Complete by: As directed    Increase activity slowly   Complete by: As directed       Allergies as of 10/12/2023       Reactions   Actonel [risedronate Sodium]    bloating   Evista [raloxifene]    Abdominal cramps   Fosamax [alendronate Sodium]    Abdominal cramps        Medication List     STOP taking these medications    ibuprofen 200 MG tablet Commonly known as: ADVIL       TAKE these medications    acetaminophen 500 MG tablet Commonly known as: TYLENOL Take 2 tablets (1,000 mg total) by mouth every 8 (eight) hours.   albuterol (2.5 MG/3ML) 0.083% nebulizer solution Commonly known as: PROVENTIL Take 3 mLs (2.5 mg total) by nebulization every 6 (six) hours as needed for wheezing.   amLODipine 5 MG tablet Commonly known as: NORVASC Take 1 tablet (5 mg total) by mouth daily.   ascorbic acid 500 MG tablet Commonly known as: VITAMIN C Take 500 mg by mouth daily.   aspirin EC 81 MG tablet Take 81 mg by mouth daily. Swallow whole.   atorvastatin 80 MG  tablet Commonly known as: LIPITOR TAKE 1 TABLET BY MOUTH DAILY   COQ10 PO Take 1 tablet by mouth daily.   docusate sodium 100 MG capsule Commonly known as: COLACE Take 100 mg by mouth daily as needed for mild constipation.   fish oil-omega-3 fatty acids 1000 MG capsule Take 1,000 mg by mouth daily.   lidocaine 4 % Place 1 patch onto the skin daily as needed (Pain).   MAGNESIUM PO Take 1 tablet by mouth daily at 12 noon.   metoprolol tartrate 25 MG tablet Commonly known as: LOPRESSOR Take 1 tablet (25 mg total) by  mouth 2 (two) times daily.   MULTIPLE VITAMIN PO Take 1 tablet by mouth daily.   oxyCODONE 5 MG immediate release tablet Commonly known as: Oxy IR/ROXICODONE Take 1 tablet (5 mg total) by mouth every 4 (four) hours as needed for moderate pain (pain score 4-6) or severe pain (pain score 7-10).   sodium chloride 1 g tablet Take 1 tablet (1 g total) by mouth 2 (two) times daily with a meal for 6 days.   Synthroid 50 MCG tablet Generic drug: levothyroxine TAKE 1 TABLET BY MOUTH DAILY   traMADol 50 MG tablet Commonly known as: ULTRAM Take 1 tablet (50 mg total) by mouth every 8 (eight) hours.   VITAMIN K2 PO Take 1 tablet by mouth daily. Vitamin D3        Contact information for after-discharge care     Destination     [facility]erred [facility] .   Service: Skilled Nursing Contact information: [address] Test Update Address [place] [address] [phone]                      Time coordinating discharge: 45 min  Signed:  Joseph Art DO  [facility] 10/12/2023, 8:17 AM

## 2023-10-13 DIAGNOSIS — I10 Essential (primary) hypertension: Secondary | ICD-10-CM | POA: Diagnosis not present

## 2023-10-13 DIAGNOSIS — D649 Anemia, unspecified: Secondary | ICD-10-CM | POA: Diagnosis not present

## 2023-10-13 DIAGNOSIS — M899 Disorder of bone, unspecified: Secondary | ICD-10-CM | POA: Diagnosis not present

## 2023-10-13 DIAGNOSIS — E039 Hypothyroidism, unspecified: Secondary | ICD-10-CM | POA: Diagnosis not present

## 2023-10-13 DIAGNOSIS — R030 Elevated blood-pressure reading, without diagnosis of hypertension: Secondary | ICD-10-CM | POA: Diagnosis not present

## 2023-10-13 DIAGNOSIS — R5383 Other fatigue: Secondary | ICD-10-CM | POA: Diagnosis not present

## 2023-10-13 DIAGNOSIS — R7309 Other abnormal glucose: Secondary | ICD-10-CM | POA: Diagnosis not present

## 2023-10-13 DIAGNOSIS — M160 Bilateral primary osteoarthritis of hip: Secondary | ICD-10-CM | POA: Diagnosis not present

## 2023-10-13 DIAGNOSIS — Z7401 Bed confinement status: Secondary | ICD-10-CM | POA: Diagnosis not present

## 2023-10-13 DIAGNOSIS — J9601 Acute respiratory failure with hypoxia: Secondary | ICD-10-CM | POA: Diagnosis not present

## 2023-10-13 DIAGNOSIS — R296 Repeated falls: Secondary | ICD-10-CM | POA: Diagnosis not present

## 2023-10-13 DIAGNOSIS — S32599D Other specified fracture of unspecified pubis, subsequent encounter for fracture with routine healing: Secondary | ICD-10-CM | POA: Diagnosis not present

## 2023-10-13 DIAGNOSIS — S72009D Fracture of unspecified part of neck of unspecified femur, subsequent encounter for closed fracture with routine healing: Secondary | ICD-10-CM | POA: Diagnosis not present

## 2023-10-13 DIAGNOSIS — S32591A Other specified fracture of right pubis, initial encounter for closed fracture: Secondary | ICD-10-CM | POA: Diagnosis not present

## 2023-10-13 DIAGNOSIS — S32591D Other specified fracture of right pubis, subsequent encounter for fracture with routine healing: Secondary | ICD-10-CM | POA: Diagnosis not present

## 2023-10-13 DIAGNOSIS — M6281 Muscle weakness (generalized): Secondary | ICD-10-CM | POA: Diagnosis not present

## 2023-10-13 DIAGNOSIS — M8008XD Age-related osteoporosis with current pathological fracture, vertebra(e), subsequent encounter for fracture with routine healing: Secondary | ICD-10-CM | POA: Diagnosis not present

## 2023-10-13 DIAGNOSIS — N3000 Acute cystitis without hematuria: Secondary | ICD-10-CM | POA: Diagnosis not present

## 2023-10-13 DIAGNOSIS — K5900 Constipation, unspecified: Secondary | ICD-10-CM | POA: Diagnosis not present

## 2023-10-13 DIAGNOSIS — E785 Hyperlipidemia, unspecified: Secondary | ICD-10-CM | POA: Diagnosis not present

## 2023-10-13 DIAGNOSIS — R2681 Unsteadiness on feet: Secondary | ICD-10-CM | POA: Diagnosis not present

## 2023-10-13 DIAGNOSIS — R0902 Hypoxemia: Secondary | ICD-10-CM | POA: Diagnosis not present

## 2023-10-13 DIAGNOSIS — R278 Other lack of coordination: Secondary | ICD-10-CM | POA: Diagnosis not present

## 2023-10-13 DIAGNOSIS — S32592A Other specified fracture of left pubis, initial encounter for closed fracture: Secondary | ICD-10-CM | POA: Diagnosis not present

## 2023-10-13 DIAGNOSIS — I2510 Atherosclerotic heart disease of native coronary artery without angina pectoris: Secondary | ICD-10-CM | POA: Diagnosis not present

## 2023-10-13 NOTE — Plan of Care (Signed)

## 2023-10-13 NOTE — Progress Notes (Signed)
 Occupational Therapy Treatment Patient Details Name: Natasha Harper MRN: [id number] DOB: 25-Mar-1935 Today's Date: 10/13/2023   History of present illness Natasha Harper is a 89 yr old female who presents with L hip pain after a fall from standing. CT chest/abdomen/pelvis with contrast was negative for acute intrathoracic pathology; Progression of T5 compression fracture since prior CT; Bilateral superior and inferior pubic rami fractures. PMH:  CAD s/p PCI, HTN, HLD, hypothyroidism, Hx of left hip fracture s/p ORIF 09/2021   OT comments  Pt was received at bed level and reporting increased L low back and L pelvic area pain; nurse reported pain medication was given this a.m. Pt was instructed on rolling to the R, requiring max assist & increased time; she was unable  to tolerate repositioning to the L, due to pain.  OT positioned the bed in the chair position to promote upright tolerance and participation in self-care tasks. While in the chair position, she performed face washing and applying lip moisturizer with set-up/supervision.  Lastly, she was instructed on deep breathing and implementing rest breaks as needed, as she reported feeling shortness and breath and fatigue with activity. Continue OT plan of care. Patient will benefit from continued inpatient follow up therapy, <3 hours/day.       If plan is discharge home, recommend the following:  Two people to help with walking and/or transfers;Two people to help with bathing/dressing/bathroom;Assistance with cooking/housework;Direct supervision/assist for medications management;Direct supervision/assist for financial management;Assist for transportation;Help with stairs or ramp for entrance   Equipment Recommendations  Other (comment) (defer to next level of chair)    Recommendations for Other Services      Precautions / Restrictions Precautions Precautions: Fall Restrictions Weight Bearing Restrictions Per Provider Order: No RLE Weight  Bearing Per Provider Order: Weight bearing as tolerated LLE Weight Bearing Per Provider Order: Weight bearing as tolerated       Mobility Bed Mobility Overal bed mobility: Needs Assistance Bed Mobility: Rolling           General bed mobility comments: Pt was instructed on rolling and repositioning at bed level. She was unable to tolerate rolling to the left, due to pain, however required max assist with cues and slightly increased time to roll to the right.          ADL either performed or assessed with clinical judgement   ADL Overall ADL's : Needs assistance/impaired     Grooming: Set up;Supervision/safety;Bed level               Lower Body Dressing: Total assistance;Bed level                 General ADL Comments: OT positioned the bed in the chair position to promote upright tolerance and participation in self-care tasks. While in the chair position, she performed face washing and applying lip moisturizer with set-up/supervision.     Communication Communication Factors Affecting Communication: Hearing impaired   Cognition Arousal: Alert Behavior During Therapy: Anxious   Difficult to assess due to: Hard of hearing/deaf        Following commands impaired: Follows one step commands with increased time      Cueing   Cueing Techniques: Verbal cues, Gestural cues             Pertinent Vitals/ Pain       Pain Assessment Pain Assessment: Faces Pain Score: 6  Pain Descriptors / Indicators: Guarding, Grimacing Pain Intervention(s): Limited activity within patient's tolerance, Monitored during session, Repositioned,  Other (comment) (Nurse reported giving pain medication prior to therapy.)   Frequency  Min 2X/week        Progress Toward Goals  OT Goals(current goals can now be found in the care plan section)  Progress towards OT goals: Progressing toward goals  Acute Rehab OT Goals Patient Stated Goal: to get better OT Goal Formulation:  With patient/family Time For Goal Achievement: 10/20/23 Potential to Achieve Goals: Fair  Plan         AM-PAC OT "6 Clicks" Daily Activity     Outcome Measure   Help from another person eating meals?: A Little Help from another person taking care of personal grooming?: A Little   Help from another person bathing (including washing, rinsing, drying)?: A Lot Help from another person to put on and taking off regular upper body clothing?: A Lot Help from another person to put on and taking off regular lower body clothing?: Total 6 Click Score: 11    End of Session Equipment Utilized During Treatment: Oxygen  OT Visit Diagnosis: Muscle weakness (generalized) (M62.81);Pain;History of falling (Z91.81) Pain - Right/Left: Left Pain - part of body:  (lower back and pelvic region)   Activity Tolerance Patient limited by pain   Patient Left in bed;with call bell/phone within reach;with chair alarm set;with family/visitor present   Nurse Communication Patient requests pain meds        Time: 1610-9604 OT Time Calculation (min): 23 min  Charges: OT General Charges $OT Visit: 1 Visit OT Treatments $Self Care/Home Management : 8-22 mins $Therapeutic Activity: 8-22 mins     Reuben Likes, OTR/L 10/13/2023, 12:26 PM

## 2023-10-13 NOTE — Plan of Care (Signed)

## 2023-10-13 NOTE — Discharge Summary (Signed)
 Physician Discharge Summary  Natasha Harper NGE:[id number] DOB: 1935-02-13 DOA: 10/05/2023  PCP: [name], MD  Admit date: 10/05/2023 Discharge date: 10/13/2023  Admitted From: home Discharge disposition: SNF   Recommendations for Outpatient Follow-Up:   BMP 1 week Salt tabs x 1 week then recheck BMP- Friday Wean O2 as able Schedule tramadol/tylenol x 1 week then change to PRN referral to [facility] after discharge for f/u Monitor BP and adjust BP meds Would recommend palliative care consult if not improving Aspiration precautions-- must eat sitting upright   Discharge Diagnosis:   Principal Problem:   Bilateral pubic rami fractures, closed, initial encounter [facility]) Active Problems:   Fall at home, initial encounter   Acute cystitis without hematuria   Hypoxia   CAD (coronary artery disease)   Dyslipidemia   Hypothyroidism   Primary hypertension    Discharge Condition: stable  Diet recommendation:   Regular.  Wound care: None.  Code status: Full.   History of Present Illness:   Natasha Harper is a 89 y.o. female with medical history significant for CAD s/p PCI, HTN, HLD, hypothyroidism, Hx of left hip fracture s/p ORIF 09/2021 who presented to the ED for evaluation of left hip pain after fall at home.   Patient states that she was getting up from her recliner earlier today when she slipped and fell onto her buttocks.  She had subsequent left hip/groin pain and was unable to stand or ambulate on her home.  She denied any head injury or loss of consciousness.  She denied associated lightheadedness, dizziness, chest pain, dyspnea.   Husband states that her ambulatory status has been diminished over the last 2 years.   Hospital Course by Problem:   Hyponatremia-- ? Poor PO intake -appears to have issues with low sodium during prior hospitalizations as well, in the past patient has been placed on salt tabs with improvement -IV lasix x 1  -added  Na tabs-will need close follow-up at SNF -follow 1 week    Bilateral pubic rami fractures, closed, initial encounter (HCC) -PT/OT-SNF - Non-operative fracture.  -Will need outpatient referral to [facility] after discharge for f/u. -pain control-- added lidocaine patch and scheduled tylenol- also added scheduled ultram with improvement   Hypoxia -doubt PE. Likely due to pain meds. Wean to RA. Not on home O2. -incentive spirometry   UTI vs pna - IV Rocephin-- finished -culture NGTD   Fall at home, initial encounter -pt does not leave the home per pt and her husband. Does not use walker or cane at home.    Primary hypertension - husband states pt not taking any BP meds at home. -added BP meds and will need further adjustment   Hypothyroidism -continue synthroid   Dyslipidemia -continue lipitor.   CAD (coronary artery disease) - stable.on ASA and lipitor.      Medical Consultants:   Ortho (phone)   Discharge Exam:   Vitals:   10/13/23 0524 10/13/23 0843  BP: (!) 171/72   Pulse: 73   Resp: 20   Temp: 98.2 F (36.8 C)   SpO2: 93% 90%   Vitals:   10/12/23 1202 10/12/23 2041 10/13/23 0524 10/13/23 0843  BP: (!) 151/68 (!) 167/68 (!) 171/72   Pulse: 67 77 73   Resp: 20 20 20    Temp: 98.2 F (36.8 C) 97.7 F (36.5 C) 98.2 F (36.8 C)   TempSrc:      SpO2: 92% 91% 93% 90%  Weight:  Height:        General exam: Appears calm and comfortable. Pain better controlled- hard of hearing   The results of significant diagnostics from this hospitalization (including imaging, microbiology, ancillary and laboratory) are listed below for reference.     Procedures and Diagnostic Studies:   CT CHEST ABDOMEN PELVIS W CONTRAST Result Date: 10/05/2023 CLINICAL DATA:  Sepsis.  Fall and left hip pain. EXAM: CT CHEST, ABDOMEN, AND PELVIS WITH CONTRAST TECHNIQUE: Multidetector CT imaging of the chest, abdomen and pelvis was performed following the standard protocol  during bolus administration of intravenous contrast. RADIATION DOSE REDUCTION: This exam was performed according to the departmental dose-optimization program which includes automated exposure control, adjustment of the mA and/or kV according to patient size and/or use of iterative reconstruction technique. CONTRAST:  OMNIPAQUE IOHEXOL 300 MG/ML  SOLN COMPARISON:  Chest CT dated 07/05/2021. FINDINGS: Evaluation of this exam is limited due to respiratory motion. CT CHEST FINDINGS Cardiovascular: Mild cardiomegaly. No pericardial effusion. There is coronary vascular calcification. Moderate atherosclerotic calcification of the thoracic aorta. No aneurysmal dilatation or dissection. The origins of the great vessels of the aortic arch and the central pulmonary arteries appear patent. Mediastinum/Nodes: No hilar or mediastinal adenopathy. The esophagus is grossly unremarkable. No mediastinal fluid collection Lungs/Pleura: Bibasilar subpleural atelectasis. There is no pleural effusion or pneumothorax. The central airways are patent. Musculoskeletal: Osteopenia with degenerative changes of the spine. Old healed sternal fractures. Multilevel chronic appearing thoracic compression deformities. T6 vertebroplasty. Progression of T5 compression fracture since the prior CT. Correlation with clinical exam and point tenderness recommended. Evaluation for fracture is very limited due to severe osteoporosis. Multiple bilateral breast nodules. Correlation with clinical exam and mammographic studies as clinically indicated. CT ABDOMEN PELVIS FINDINGS No intra-abdominal free air or free fluid. Hepatobiliary: Small liver cyst. Mild biliary dilatation. The gallbladder is unremarkable Pancreas: Unremarkable. No pancreatic ductal dilatation or surrounding inflammatory changes. Spleen: Normal in size without focal abnormality. Adrenals/Urinary Tract: The adrenal glands are unremarkable. There is no hydronephrosis on either side. There  is symmetric enhancement and excretion of contrast by both kidneys. Small bilateral renal cysts. The visualized ureters appear unremarkable. The urinary bladder is grossly unremarkable. Stomach/Bowel: There is sigmoid diverticulosis with muscular hypertrophy. There is no bowel obstruction or active inflammation. The appendix is normal. Vascular/Lymphatic: Advanced aortoiliac atherosclerotic disease. The IVC is unremarkable. No portal venous gas. There is no adenopathy. Reproductive: The uterus is grossly unremarkable. No suspicious adnexal masses. Other: Bilateral inguinal hernias containing short segments of small bowel, left greater than right. No obstruction. Musculoskeletal: Status post prior ORIF of the left femoral neck. Minimally displaced fracture of the right superior pubic ramus and probable nondisplaced fracture of the right inferior pubic ramus. There is a nondisplaced fracture of the left pubic bone extending from the ischial tuberosity into the left inferior pubic ramus. Minimally displaced fracture of the left superior pubic ramus. There is small hematoma in the anterior pelvis. IMPRESSION: 1. No acute intrathoracic pathology. 2. Progression of T5 compression fracture since the prior CT. Correlation with clinical exam and point tenderness recommended. 3. Bilateral superior and inferior pubic rami fractures. 4. Sigmoid diverticulosis. No bowel obstruction. Normal appendix. 5. Bilateral inguinal hernias containing short segments of small bowel, left greater than right. No obstruction. 6. Multiple bilateral breast nodules. Electronically Signed   By: Elgie Collard M.D.   On: 10/05/2023 18:26   CT Head Wo Contrast Result Date: 10/05/2023 CLINICAL DATA:  Ataxia, head trauma. EXAM: CT HEAD WITHOUT  CONTRAST TECHNIQUE: Contiguous axial images were obtained from the base of the skull through the vertex without intravenous contrast. RADIATION DOSE REDUCTION: This exam was performed according to the  departmental dose-optimization program which includes automated exposure control, adjustment of the mA and/or kV according to patient size and/or use of iterative reconstruction technique. COMPARISON:  Head CT 07/05/2021 FINDINGS: Brain: No evidence of acute infarction, hemorrhage, hydrocephalus, extra-axial collection or mass lesion/mass effect. Again seen is mild diffuse atrophy and moderate periventricular white matter hypodensity, likely chronic small vessel ischemic change. Vascular: Atherosclerotic calcifications are present within the cavernous internal carotid arteries. Skull: Normal. Negative for fracture or focal lesion. Sinuses/Orbits: No acute finding. Other: None. IMPRESSION: 1. No acute intracranial process. 2. Stable atrophy and chronic small vessel ischemic changes. Electronically Signed   By: Darliss Cheney M.D.   On: 10/05/2023 18:24   CT Cervical Spine Wo Contrast Result Date: 10/05/2023 CLINICAL DATA:  Slipped and fell, ataxia, cervical trauma EXAM: CT CERVICAL SPINE WITHOUT CONTRAST TECHNIQUE: Multidetector CT imaging of the cervical spine was performed without intravenous contrast. Multiplanar CT image reconstructions were also generated. RADIATION DOSE REDUCTION: This exam was performed according to the departmental dose-optimization program which includes automated exposure control, adjustment of the mA and/or kV according to patient size and/or use of iterative reconstruction technique. COMPARISON:  07/05/2021 FINDINGS: Alignment: Normal. Skull base and vertebrae: No acute fracture. No primary bone lesion or focal pathologic process. Soft tissues and spinal canal: No prevertebral fluid or swelling. No visible canal hematoma. Disc levels: Mild spondylosis at C4-5, C5-6, and C6-7. No significant bony encroachment upon the central canal or neural foramina. Upper chest: Airway is patent. Visualized portions of the lung apices are clear. Other: Reconstructed images demonstrate no additional  findings. IMPRESSION: 1. No acute cervical spine fracture. Electronically Signed   By: Sharlet Salina M.D.   On: 10/05/2023 18:20   DG FEMUR MIN 2 VIEWS LEFT Result Date: 10/05/2023 CLINICAL DATA:  Left hip pain after fall, prior left hip fracture status post ORIF EXAM: LEFT FEMUR 2 VIEWS COMPARISON:  04/15/2023 FINDINGS: Frontal and cross-table lateral views of the left femur are obtained. Intramedullary rod with proximal dynamic screws traverse a prior healed intertrochanteric left hip fracture. Please refer to left hip study describing subtle lucencies through the left superior and inferior pubic rami suspicious for nondisplaced fractures. No evidence of acute femoral fracture. Mild osteoarthritis of the left hip and knee. Prominent soft tissue swelling overlying the lateral aspect of the left hip consistent with contusion. IMPRESSION: 1. Equivocal findings for nondisplaced left superior and inferior pubic rami fractures. CT recommended. Please see left hip exam for complete characterization. 2. Prior ORIF of a healed intertrochanteric left hip fracture. Otherwise unremarkable left femur. 3. Soft tissue swelling overlying the lateral aspect left hip, consistent with contusion. Electronically Signed   By: Sharlet Salina M.D.   On: 10/05/2023 16:42   DG Chest Portable 1 View Result Date: 10/05/2023 CLINICAL DATA:  Slipped and fell, cough EXAM: PORTABLE CHEST 1 VIEW COMPARISON:  07/05/2021 FINDINGS: Single frontal view of the chest demonstrates stable enlargement of the cardiac silhouette. Lung volumes are diminished, with chronic the pulmonary vasculature. No airspace disease, effusion, or pneumothorax. No acute displaced fracture. Prior midthoracic vertebral augmentation. Diffuse atherosclerosis. IMPRESSION: 1. Low lung volumes.  No acute airspace disease. Electronically Signed   By: Sharlet Salina M.D.   On: 10/05/2023 16:41   DG Hip Unilat With Pelvis 2-3 Views Left Result Date: 10/05/2023 CLINICAL  DATA:  Slipped and fell, left hip pain, inability to bear weight EXAM: DG HIP (WITH OR WITHOUT PELVIS) 2-3V LEFT COMPARISON:  04/15/2023 FINDINGS: Frontal view of the pelvis as well as frontal and cross-table lateral views of the left hip are obtained. The proximal extent of an intramedullary rod and 2 dynamic screws are seen traversing the left hip, without significant change since prior study. Heterotopic ossification adjacent to the left lesser trochanter. Prior healed left inferior pubic ramus fracture. There are subtle lucencies of the left superior pubic ramus and left inferior ramus extending to the ischium, best seen on the frontal view of the left hip. These are likely due to overlying skin folds are superimposed shadows, though minimally displaced fractures cannot be excluded in light of trauma and inability to bear weight. Further evaluation with CT may be useful. Stable mild bilateral hip osteoarthritis. Marked subcutaneous edema overlying the lateral aspect of the left hip. IMPRESSION: 1. Equivocal findings for nondisplaced fractures through the left superior ramus and left inferior ramus/ischium. CT may be useful for further evaluation. 2. Extensive subcutaneous edema over the lateral aspect of the left hip, compatible with contusion after fall. 3. Stable ORIF of a prior healed intertrochanteric left hip fracture. 4. Stable bilateral hip osteoarthritis. Electronically Signed   By: Sharlet Salina M.D.   On: 10/05/2023 16:40     Labs:   Basic Metabolic Panel: Recent Labs  Lab 10/09/23 0414 10/09/23 1433 10/10/23 0633 10/11/23 0411  NA 125* 123* 126* 128*  K 3.8 3.9 3.8 3.7  CL 96* 93* 97* 97*  CO2 19* 20* 23 23  GLUCOSE 91 153* 96 104*  BUN 16 17 13 14   CREATININE <0.30* 0.57 0.30* 0.36*  CALCIUM 8.3* 8.0* 8.0* 8.2*   GFR Estimated Creatinine Clearance: 43.4 mL/min (A) (by C-G formula based on SCr of 0.33 mg/dL (L)). Liver Function Tests: No results for input(s): "AST", "ALT",  "ALKPHOS", "BILITOT", "PROT", "ALBUMIN" in the last 168 hours.  No results for input(s): "LIPASE", "AMYLASE" in the last 168 hours. No results for input(s): "AMMONIA" in the last 168 hours. Coagulation profile No results for input(s): "INR", "PROTIME" in the last 168 hours.   CBC: Recent Labs  Lab 10/08/23 0427 10/09/23 0414  WBC 6.0 5.5  HGB 8.8* 9.0*  HCT 28.1* 28.0*  MCV 111.5* 108.1*  PLT 132* 139*   Cardiac Enzymes: No results for input(s): "CKTOTAL", "CKMB", "CKMBINDEX", "TROPONINI" in the last 168 hours. BNP: Invalid input(s): "POCBNP" CBG: No results for input(s): "GLUCAP" in the last 168 hours. D-Dimer No results for input(s): "DDIMER" in the last 72 hours. Hgb A1c No results for input(s): "HGBA1C" in the last 72 hours. Lipid Profile No results for input(s): "CHOL", "HDL", "LDLCALC", "TRIG", "CHOLHDL", "LDLDIRECT" in the last 72 hours. Thyroid function studies No results for input(s): "TSH", "T4TOTAL", "T3FREE", "THYROIDAB" in the last 72 hours.  Invalid input(s): "FREET3" Anemia work up No results for input(s): "VITAMINB12", "FOLATE", "FERRITIN", "TIBC", "IRON", "RETICCTPCT" in the last 72 hours. Microbiology Recent Results (from the past 240 hours)  Resp panel by RT-PCR (RSV, Flu A&B, Covid) Anterior Nasal Swab     Status: None   Collection Time: 10/05/23  4:26 PM   Specimen: Anterior Nasal Swab  Result Value Ref Range Status   SARS Coronavirus 2 by RT PCR NEGATIVE NEGATIVE Final    Comment: (NOTE) SARS-CoV-2 target nucleic acids are NOT DETECTED.  The SARS-CoV-2 RNA is generally detectable in upper respiratory specimens during the acute phase of infection. The lowest  concentration of SARS-CoV-2 viral copies this assay can detect is 138 copies/mL. A negative result does not preclude SARS-Cov-2 infection and should not be used as the sole basis for treatment or other patient management decisions. A negative result may occur with  improper specimen  collection/handling, submission of specimen other than nasopharyngeal swab, presence of viral mutation(s) within the areas targeted by this assay, and inadequate number of viral copies(<138 copies/mL). A negative result must be combined with clinical observations, patient history, and epidemiological information. The expected result is Negative.  Fact Sheet for Patients:  BloggerCourse.com  Fact Sheet for Healthcare Providers:  SeriousBroker.it  This test is no t yet approved or cleared by the [place] FDA and  has been authorized for detection and/or diagnosis of SARS-CoV-2 by FDA under an Emergency Use Authorization (EUA). This EUA will remain  in effect (meaning this test can be used) for the duration of the COVID-19 declaration under Section 564(b)(1) of the Act, 21 U.S.C.section 360bbb-3(b)(1), unless the authorization is terminated  or revoked sooner.       Influenza A by PCR NEGATIVE NEGATIVE Final   Influenza B by PCR NEGATIVE NEGATIVE Final    Comment: (NOTE) The Xpert Xpress SARS-CoV-2/FLU/RSV plus assay is intended as an aid in the diagnosis of influenza from Nasopharyngeal swab specimens and should not be used as a sole basis for treatment. Nasal washings and aspirates are unacceptable for Xpert Xpress SARS-CoV-2/FLU/RSV testing.  Fact Sheet for Patients: BloggerCourse.com  Fact Sheet for Healthcare Providers: SeriousBroker.it  This test is not yet approved or cleared by the [place] FDA and has been authorized for detection and/or diagnosis of SARS-CoV-2 by FDA under an Emergency Use Authorization (EUA). This EUA will remain in effect (meaning this test can be used) for the duration of the COVID-19 declaration under Section 564(b)(1) of the Act, 21 U.S.C. section 360bbb-3(b)(1), unless the authorization is terminated or revoked.     Resp Syncytial  Virus by PCR NEGATIVE NEGATIVE Final    Comment: (NOTE) Fact Sheet for Patients: BloggerCourse.com  Fact Sheet for Healthcare Providers: SeriousBroker.it  This test is not yet approved or cleared by the [place] FDA and has been authorized for detection and/or diagnosis of SARS-CoV-2 by FDA under an Emergency Use Authorization (EUA). This EUA will remain in effect (meaning this test can be used) for the duration of the COVID-19 declaration under Section 564(b)(1) of the Act, 21 U.S.C. section 360bbb-3(b)(1), unless the authorization is terminated or revoked.  Performed at [facility], [address], [place], [place] [address]   Blood Culture (routine x 2)     Status: None   Collection Time: 10/05/23  4:33 PM   Specimen: BLOOD  Result Value Ref Range Status   Specimen Description   Final    BLOOD RIGHT ANTECUBITAL Performed at [facility], [address] [place], [place] [address]    Special Requests   Final    BOTTLES DRAWN AEROBIC AND ANAEROBIC Blood Culture results may not be optimal due to an inadequate volume of blood received in culture bottles Performed at [facility], [address], [place], [place] [address]    Culture   Final    NO GROWTH 5 DAYS Performed at [facility], [address] [place], [place] [address]    Report Status 10/10/2023 FINAL  Final  Blood Culture (routine x 2)     Status: None   Collection Time: 10/05/23  4:50 PM  Specimen: BLOOD RIGHT HAND  Result Value Ref Range Status   Specimen Description   Final    BLOOD RIGHT HAND Performed at [facility], [address] [place], [place] [address]    Special Requests   Final    BOTTLES DRAWN AEROBIC AND ANAEROBIC Blood Culture results may not be optimal due to an inadequate volume of blood received in culture bottles Performed at [facility], [address], [place], [place] [address]    Culture   Final    NO GROWTH 5 DAYS Performed at [facility], [address] [place], [place] [address]    Report Status 10/10/2023 FINAL  Final  Urine Culture     Status: Abnormal   Collection Time: 10/05/23  6:11 PM   Specimen: Urine, Clean Catch  Result Value Ref Range Status   Specimen Description   Final    URINE, CLEAN CATCH Performed at [facility], [address], [place], [place] [address]    Special Requests   Final    NONE Reflexed from [id number] Performed at [facility], [address], [place], [place] [address]    Culture MULTIPLE SPECIES PRESENT, SUGGEST RECOLLECTION (A)  Final   Report Status 10/06/2023 FINAL  Final     Discharge Instructions:   Discharge Instructions     Diet general   Complete by: As directed    Increase activity slowly   Complete by: As directed       Allergies as of 10/13/2023       Reactions   Actonel [risedronate Sodium]    bloating   Evista [raloxifene]    Abdominal cramps   Fosamax [alendronate Sodium]    Abdominal cramps        Medication List     STOP taking these medications    ibuprofen 200 MG tablet Commonly known as: ADVIL       TAKE these medications    acetaminophen 500 MG tablet Commonly known as: TYLENOL Take 2 tablets (1,000 mg total) by mouth every 8 (eight) hours.   albuterol (2.5 MG/3ML) 0.083% nebulizer solution Commonly known as: PROVENTIL Take 3 mLs (2.5 mg total) by nebulization every 6 (six) hours as needed for wheezing.   amLODipine 5 MG tablet Commonly known as: NORVASC Take 1 tablet (5 mg total) by mouth daily.   ascorbic acid 500 MG tablet Commonly known as: VITAMIN C Take 500 mg by mouth daily.   aspirin EC 81 MG tablet Take 81 mg by mouth daily. Swallow whole.   atorvastatin 80 MG tablet Commonly known as: LIPITOR TAKE 1 TABLET BY MOUTH DAILY   COQ10 PO Take 1 tablet by mouth daily.   docusate  sodium 100 MG capsule Commonly known as: COLACE Take 100 mg by mouth daily as needed for mild constipation.   fish oil-omega-3 fatty acids 1000 MG capsule Take 1,000 mg by mouth daily.   lidocaine 4 % Place 1 patch onto the skin daily as needed (Pain).   MAGNESIUM PO Take 1 tablet by mouth daily at 12 noon.   metoprolol tartrate 25 MG tablet Commonly known as: LOPRESSOR Take 1 tablet (25 mg total) by mouth 2 (two) times daily.   MULTIPLE VITAMIN PO Take 1 tablet by mouth daily.   oxyCODONE 5 MG immediate release tablet Commonly known as: Oxy IR/ROXICODONE Take 1 tablet (5 mg total) by mouth every 4 (four) hours as needed for moderate pain (pain score  4-6) or severe pain (pain score 7-10).   sodium chloride 1 g tablet Take 1 tablet (1 g total) by mouth 2 (two) times daily with a meal for 6 days.   Synthroid 50 MCG tablet Generic drug: levothyroxine TAKE 1 TABLET BY MOUTH DAILY   traMADol 50 MG tablet Commonly known as: ULTRAM Take 1 tablet (50 mg total) by mouth every 8 (eight) hours.   VITAMIN K2 PO Take 1 tablet by mouth daily. Vitamin D3        Contact information for after-discharge care     Destination     [facility] .   Service: Skilled Nursing Contact information: [address] Test Update Address [place] [address] [phone]                      Time coordinating discharge: 45 min  Signed:  Joseph Art DO  [facility] 10/13/2023, 9:28 AM

## 2023-10-15 DIAGNOSIS — E039 Hypothyroidism, unspecified: Secondary | ICD-10-CM | POA: Diagnosis not present

## 2023-10-15 DIAGNOSIS — J9601 Acute respiratory failure with hypoxia: Secondary | ICD-10-CM | POA: Diagnosis not present

## 2023-10-15 DIAGNOSIS — S32599D Other specified fracture of unspecified pubis, subsequent encounter for fracture with routine healing: Secondary | ICD-10-CM | POA: Diagnosis not present

## 2023-10-15 DIAGNOSIS — I10 Essential (primary) hypertension: Secondary | ICD-10-CM | POA: Diagnosis not present

## 2023-10-22 DIAGNOSIS — I10 Essential (primary) hypertension: Secondary | ICD-10-CM | POA: Diagnosis not present

## 2023-10-22 DIAGNOSIS — J9601 Acute respiratory failure with hypoxia: Secondary | ICD-10-CM | POA: Diagnosis not present

## 2023-11-05 ENCOUNTER — Telehealth: Payer: Self-pay | Admitting: *Deleted

## 2023-11-05 NOTE — Telephone Encounter (Signed)
 Copied from CRM [id number]. Topic: Clinical - Home Health Verbal Orders >> Nov 05, 2023  3:09 PM [place] J wrote: Caller/Agency: Loetta Ringer - [facility] Callback Number: [phone] Service Requested: Physical Therapy Frequency: 2 week 4; 1 week 4 Any new concerns about the patient? Yes Pelvic fracture after fall.

## 2023-11-09 NOTE — Telephone Encounter (Signed)
 Attempted to reach [facility]. Left a detail message of pt's name, DOB, and on the approval of VO. To call us  back if have any questions.

## 2023-12-21 ENCOUNTER — Other Ambulatory Visit: Payer: Self-pay | Admitting: Family

## 2023-12-23 ENCOUNTER — Telehealth: Payer: Self-pay

## 2023-12-23 NOTE — Telephone Encounter (Signed)
 Reaching out to pt. Spoke to spouse, Krystelle Prashad, yesterday to schedule an appt with dr. Ethel Henry as pt has not been seen since 01/28/2022.   Spouse states decline a visit as this time as pt had broke her clavicle and he himself was diagnose with cancer.   Advise to give us  a call when ready. Spouse verbalized.   Called [facility] to give them update. Left a voicemail to call us  back.

## 2023-12-28 ENCOUNTER — Other Ambulatory Visit: Payer: Self-pay | Admitting: Internal Medicine

## 2023-12-28 NOTE — Telephone Encounter (Signed)
 Copied from CRM [id number]. Topic: Clinical - Medication Refill >> Dec 28, 2023  1:37 PM Corin V wrote: Medication: atorvastatin  (LIPITOR ) 80 MG tablet SYNTHROID  50 MCG tablet  Has the patient contacted their pharmacy? Yes (Agent: If no, request that the patient contact the pharmacy for the refill. If patient does not wish to contact the pharmacy document the reason why and proceed with request.) (Agent: If yes, when and what did the pharmacy advise?)  This is the patient's preferred pharmacy:   [facility] - [place], [place] - [address] [place] [address] Phone: [phone] Fax: [phone]  Is this the correct pharmacy for this prescription? Yes If no, delete pharmacy and type the correct one.   Has the prescription been filled recently? No  Is the patient out of the medication? No  Has the patient been seen for an appointment in the last year OR does the patient have an upcoming appointment? Yes  Can we respond through MyChart? No  Agent: Please be advised that Rx refills may take up to 3 business days. We ask that you follow-up with your pharmacy.

## 2023-12-31 ENCOUNTER — Telehealth: Payer: Self-pay | Admitting: *Deleted

## 2023-12-31 MED ORDER — ATORVASTATIN CALCIUM 80 MG PO TABS
ORAL_TABLET | ORAL | 0 refills | Status: AC
Start: 2023-12-31 — End: ?

## 2023-12-31 MED ORDER — SYNTHROID 50 MCG PO TABS: ORAL_TABLET | ORAL | 0 refills | Status: DC

## 2023-12-31 MED ORDER — ATORVASTATIN CALCIUM 80 MG PO TABS: ORAL_TABLET | ORAL | 0 refills | Status: DC

## 2023-12-31 NOTE — Telephone Encounter (Signed)
 LAst visit with pcp was 2023  future visit in sept 25  Last tsh in record is about 2 years ago as well as ldl.  Please advise patient  due for lab monitoring  Ok to refill each x 1 until gets  fu .   Advise arrange visit earlier than the September visit  .

## 2023-12-31 NOTE — Telephone Encounter (Signed)
 Source  Natasha Harper (Patient)   Subject  Natasha Harper (Patient)   Topic  Clinical - Medication Refill    Communication  Medication: atorvastatin  (LIPITOR ) 80 MG tablet    SYNTHROID  50 MCG tablet        Has the patient contacted their pharmacy? Yes    (Agent: If no, request that the patient contact the pharmacy for the refill. If patient does not wish to contact the pharmacy document the reason why and proceed with request.)    (Agent: If yes, when and what did the pharmacy advise?)        This is the patient's preferred pharmacy:        [facility] - [place], [place] - [address]    [place] [address]    Phone: [phone] Fax: [phone]        Is this the correct pharmacy for this prescription? Yes    If no, delete pharmacy and type the correct one.        Has the prescription been filled recently? No        Is the patient out of the medication? No        Has the patient been seen for an appointment in the last year OR does the patient have an upcoming appointment? Yes        Can we respond through MyChart? No        Agent: Please be advised that Rx refills may take up to 3 business days. We ask that you follow-up with your pharmacy.

## 2023-12-31 NOTE — Telephone Encounter (Signed)
 Gareth Lab D   12/31/2023 10:12 AM  Pt spouse advise she will be out of medication soon and really need her medications refill, pt spouse would like to be notified once refill is sent [phone]

## 2023-12-31 NOTE — Telephone Encounter (Signed)
 Please see other phone encounter

## 2024-01-03 MED ORDER — ATORVASTATIN CALCIUM 80 MG PO TABS: ORAL_TABLET | ORAL | 3 refills | Status: DC

## 2024-01-03 MED ORDER — SYNTHROID 50 MCG PO TABS: ORAL_TABLET | ORAL | 3 refills | Status: DC

## 2024-01-03 NOTE — Telephone Encounter (Signed)
[place] I agree.

## 2024-01-14 NOTE — Telephone Encounter (Signed)
 Contacted [facility] and spoke to [place]. Inform her we received a fax from them about an order number: [id number]. Inform her, we have not seen pt since 2023 and that's why we have not fax over the documentation.  She states she will let designated person know and if need anything they will contact us .

## 2024-02-11 ENCOUNTER — Telehealth: Payer: Self-pay

## 2024-02-11 NOTE — Telephone Encounter (Signed)
 Spoke to Natasha Harper and explain the reason we haven't fax over due to pt was not seen since 2023. We did try to get to pt in, pt has upcoming visit on 9/2 but husband states, he is not sure they will able to. Also inform her we did spoke to someone before regarding to this previously. She apologize as there is no notes on it.    Inform her provider is okay to sign but has not seen since 2023. Natasha Harper suggests she can see if Dr. Learta director for [employer] pt was in, can sign the plan of care. Then they can let us  know.   Agreed with the suggestion and for them to let us  know.

## 2024-02-11 NOTE — Telephone Encounter (Signed)
 Copied from CRM #[id number]. Topic: General - Other >> Feb 10, 2024  3:46 PM Mercedes MATSU wrote: Reason for CRM: Nathanel called from [facility]. Nathanel states that a Home health plan of care was faxed to the office on 11/04/2023 was supposed to filled out and returned back. Nathanel is requesting this gets signed and dated and sent back as soon as possible (today if possible) order #[id number], call back number [phone].

## 2024-03-08 ENCOUNTER — Telehealth: Payer: Self-pay | Admitting: Internal Medicine

## 2024-03-08 ENCOUNTER — Ambulatory Visit: Admitting: Internal Medicine

## 2024-03-08 NOTE — Telephone Encounter (Signed)
 Natasha Harper from [facility] Plan of Care dropped off paperwork to be filled out. Paperwork in folder

## 2024-03-09 NOTE — Telephone Encounter (Signed)
 Did not get an update from [facility] reference to encounter 02/11/2024.  Pt did not show to her appt on 9/2.   Brought it up to provider. Dr. Charlett states she is not comfortable signing off as pt has not been seen since 2023.   Attempted to reach pt to ask if she has seen with other provider recently. Pt's phone is full.   Attempted to follow up with [facility] to get an update from encounter 02/11/24 conversation. Spoke to [employer], after-hour help desk. She took the message and said will have the facility to call us  back tomorrow.

## 2024-03-17 NOTE — Telephone Encounter (Signed)
 Attempted to pt. Left a voicemail to call us  back.

## 2024-05-23 ENCOUNTER — Telehealth: Payer: Self-pay | Admitting: Internal Medicine

## 2024-05-23 NOTE — Telephone Encounter (Signed)
 Copied from CRM #[id number]. Topic: General - Other >> May 23, 2024 11:59 AM Franky GRADE wrote: Reason for CRM: Nathanel called from [facility] to see if the office has received orders they have been trying to fax since April, She will be faxing them gain today attention Dr.Panosh. Confirmed correct fax number.

## 2024-05-24 NOTE — Telephone Encounter (Signed)
 Pt has not been seen since 2023. Did spoke to representative in previous message stating to them of reasons. Attempt to have pt to come in. Spouse states pt was not able to come in and appt was canceled.    Contacted pt and spoke to her spouse. Explain to him of situation. He states pt is not able to get out of house, has several falls. Offer a virtual visit, he states he will ask pt then call us  back.

## 2024-06-16 ENCOUNTER — Encounter (HOSPITAL_COMMUNITY)
Admission: EM | Disposition: A | Discharge status: Skilled Nursing Facility | Payer: Self-pay | Source: Home / Self Care | Attending: Family Medicine

## 2024-06-16 ENCOUNTER — Emergency Department (HOSPITAL_COMMUNITY)

## 2024-06-16 ENCOUNTER — Inpatient Hospital Stay (HOSPITAL_COMMUNITY)

## 2024-06-16 ENCOUNTER — Other Ambulatory Visit: Payer: Self-pay

## 2024-06-16 ENCOUNTER — Encounter (HOSPITAL_COMMUNITY): Payer: Self-pay

## 2024-06-16 ENCOUNTER — Inpatient Hospital Stay (HOSPITAL_COMMUNITY)
Admission: EM | Admit: 2024-06-16 | Discharge: 2024-06-22 | DRG: 956 | Disposition: A | Discharge status: Skilled Nursing Facility | Attending: Family Medicine | Admitting: Family Medicine

## 2024-06-16 ENCOUNTER — Inpatient Hospital Stay (HOSPITAL_COMMUNITY): Admitting: Anesthesiology

## 2024-06-16 DIAGNOSIS — S32501D Unspecified fracture of right pubis, subsequent encounter for fracture with routine healing: Secondary | ICD-10-CM | POA: Diagnosis not present

## 2024-06-16 DIAGNOSIS — E785 Hyperlipidemia, unspecified: Secondary | ICD-10-CM | POA: Diagnosis not present

## 2024-06-16 DIAGNOSIS — I10 Essential (primary) hypertension: Secondary | ICD-10-CM | POA: Diagnosis not present

## 2024-06-16 DIAGNOSIS — W19XXXA Unspecified fall, initial encounter: Principal | ICD-10-CM

## 2024-06-16 DIAGNOSIS — Z79890 Hormone replacement therapy (postmenopausal): Secondary | ICD-10-CM | POA: Diagnosis not present

## 2024-06-16 DIAGNOSIS — Z7982 Long term (current) use of aspirin: Secondary | ICD-10-CM | POA: Diagnosis not present

## 2024-06-16 DIAGNOSIS — M810 Age-related osteoporosis without current pathological fracture: Secondary | ICD-10-CM | POA: Diagnosis present

## 2024-06-16 DIAGNOSIS — I2510 Atherosclerotic heart disease of native coronary artery without angina pectoris: Secondary | ICD-10-CM | POA: Diagnosis not present

## 2024-06-16 DIAGNOSIS — E7800 Pure hypercholesterolemia, unspecified: Secondary | ICD-10-CM | POA: Diagnosis present

## 2024-06-16 DIAGNOSIS — Z888 Allergy status to other drugs, medicaments and biological substances status: Secondary | ICD-10-CM | POA: Diagnosis not present

## 2024-06-16 DIAGNOSIS — H9190 Unspecified hearing loss, unspecified ear: Secondary | ICD-10-CM | POA: Diagnosis present

## 2024-06-16 DIAGNOSIS — Z803 Family history of malignant neoplasm of breast: Secondary | ICD-10-CM

## 2024-06-16 DIAGNOSIS — D539 Nutritional anemia, unspecified: Secondary | ICD-10-CM | POA: Diagnosis present

## 2024-06-16 DIAGNOSIS — M533 Sacrococcygeal disorders, not elsewhere classified: Secondary | ICD-10-CM | POA: Diagnosis not present

## 2024-06-16 DIAGNOSIS — Z8781 Personal history of (healed) traumatic fracture: Secondary | ICD-10-CM

## 2024-06-16 DIAGNOSIS — M85851 Other specified disorders of bone density and structure, right thigh: Secondary | ICD-10-CM | POA: Diagnosis not present

## 2024-06-16 DIAGNOSIS — E039 Hypothyroidism, unspecified: Secondary | ICD-10-CM | POA: Diagnosis not present

## 2024-06-16 DIAGNOSIS — Z23 Encounter for immunization: Secondary | ICD-10-CM

## 2024-06-16 DIAGNOSIS — Z9861 Coronary angioplasty status: Secondary | ICD-10-CM | POA: Diagnosis not present

## 2024-06-16 DIAGNOSIS — S72141A Displaced intertrochanteric fracture of right femur, initial encounter for closed fracture: Secondary | ICD-10-CM | POA: Diagnosis not present

## 2024-06-16 DIAGNOSIS — I252 Old myocardial infarction: Secondary | ICD-10-CM

## 2024-06-16 DIAGNOSIS — Z8249 Family history of ischemic heart disease and other diseases of the circulatory system: Secondary | ICD-10-CM

## 2024-06-16 DIAGNOSIS — Z7901 Long term (current) use of anticoagulants: Secondary | ICD-10-CM

## 2024-06-16 DIAGNOSIS — S32502D Unspecified fracture of left pubis, subsequent encounter for fracture with routine healing: Secondary | ICD-10-CM | POA: Diagnosis not present

## 2024-06-16 DIAGNOSIS — Y92009 Unspecified place in unspecified non-institutional (private) residence as the place of occurrence of the external cause: Secondary | ICD-10-CM

## 2024-06-16 DIAGNOSIS — S3210XA Unspecified fracture of sacrum, initial encounter for closed fracture: Secondary | ICD-10-CM | POA: Diagnosis present

## 2024-06-16 DIAGNOSIS — Z87891 Personal history of nicotine dependence: Secondary | ICD-10-CM

## 2024-06-16 DIAGNOSIS — S32501A Unspecified fracture of right pubis, initial encounter for closed fracture: Secondary | ICD-10-CM | POA: Diagnosis not present

## 2024-06-16 DIAGNOSIS — S7290XA Unspecified fracture of unspecified femur, initial encounter for closed fracture: Secondary | ICD-10-CM | POA: Diagnosis present

## 2024-06-16 DIAGNOSIS — D62 Acute posthemorrhagic anemia: Secondary | ICD-10-CM | POA: Diagnosis not present

## 2024-06-16 DIAGNOSIS — S32502A Unspecified fracture of left pubis, initial encounter for closed fracture: Secondary | ICD-10-CM | POA: Diagnosis not present

## 2024-06-16 DIAGNOSIS — T07XXXA Unspecified multiple injuries, initial encounter: Secondary | ICD-10-CM | POA: Diagnosis not present

## 2024-06-16 DIAGNOSIS — S72001A Fracture of unspecified part of neck of right femur, initial encounter for closed fracture: Secondary | ICD-10-CM | POA: Diagnosis not present

## 2024-06-16 DIAGNOSIS — W1830XA Fall on same level, unspecified, initial encounter: Secondary | ICD-10-CM | POA: Diagnosis present

## 2024-06-16 DIAGNOSIS — M1611 Unilateral primary osteoarthritis, right hip: Secondary | ICD-10-CM | POA: Diagnosis not present

## 2024-06-16 DIAGNOSIS — M8580 Other specified disorders of bone density and structure, unspecified site: Secondary | ICD-10-CM | POA: Diagnosis not present

## 2024-06-16 DIAGNOSIS — Z8262 Family history of osteoporosis: Secondary | ICD-10-CM

## 2024-06-16 DIAGNOSIS — Z79899 Other long term (current) drug therapy: Secondary | ICD-10-CM

## 2024-06-16 HISTORY — PX: INTRAMEDULLARY (IM) NAIL INTERTROCHANTERIC: SHX5875

## 2024-06-16 LAB — BASIC METABOLIC PANEL WITH GFR
Anion gap: 9 (ref 5–15)
BUN: 18 mg/dL (ref 8–23)
CO2: 24 mmol/L (ref 22–32)
Calcium: 8.5 mg/dL — ABNORMAL LOW (ref 8.9–10.3)
Chloride: 103 mmol/L (ref 98–111)
Creatinine, Ser: 0.42 mg/dL — ABNORMAL LOW (ref 0.44–1.00)
GFR, Estimated: >60 mL/min (ref >60)
Glucose, Bld: 126 mg/dL — ABNORMAL HIGH (ref 70–99)
Potassium: 4.1 mmol/L (ref 3.5–5.1)
Sodium: 136 mmol/L (ref 135–145)

## 2024-06-16 LAB — CBC WITH DIFFERENTIAL/PLATELET
Abs Immature Granulocytes: 0.01 K/uL (ref 0.00–0.07)
Basophils Absolute: 0 K/uL (ref 0.0–0.1)
Basophils Relative: 0 %
Eosinophils Absolute: 0 K/uL (ref 0.0–0.5)
Eosinophils Relative: 0 %
HCT: 33.5 % — ABNORMAL LOW (ref 36.0–46.0)
Hemoglobin: 10.9 g/dL — ABNORMAL LOW (ref 12.0–15.0)
Immature Granulocytes: 0 %
Lymphocytes Relative: 6 %
Lymphs Abs: 0.5 K/uL — ABNORMAL LOW (ref 0.7–4.0)
MCH: 35.3 pg — ABNORMAL HIGH (ref 26.0–34.0)
MCHC: 32.5 g/dL (ref 30.0–36.0)
MCV: 108.4 fL — ABNORMAL HIGH (ref 80.0–100.0)
Monocytes Absolute: 0.3 K/uL (ref 0.1–1.0)
Monocytes Relative: 4 %
Neutro Abs: 6.7 K/uL (ref 1.7–7.7)
Neutrophils Relative %: 90 %
Platelets: 246 K/uL (ref 150–400)
RBC: 3.09 MIL/uL — ABNORMAL LOW (ref 3.87–5.11)
RDW: 13.3 % (ref 11.5–15.5)
WBC: 7.6 K/uL (ref 4.0–10.5)
nRBC: 0 % (ref 0.0–0.2)

## 2024-06-16 LAB — VITAMIN D 25 HYDROXY (VIT D DEFICIENCY, FRACTURES): Vit D, 25-Hydroxy: 63.9 ng/mL (ref 30–100)

## 2024-06-16 SURGERY — FIXATION, FRACTURE, INTERTROCHANTERIC, WITH INTRAMEDULLARY ROD
Anesthesia: General | Site: Hip | Laterality: Right

## 2024-06-16 MED ORDER — LIDOCAINE 2% (20 MG/ML) 5 ML SYRINGE
INTRAMUSCULAR | Status: AC
  Filled 2024-06-16: qty 5

## 2024-06-16 MED ORDER — MORPHINE SULFATE (PF) 2 MG/ML IV SOLN
2.0000 mg | Freq: Once | INTRAVENOUS | Status: AC
  Administered 2024-06-16: 2 mg via INTRAVENOUS
  Filled 2024-06-16: qty 1

## 2024-06-16 MED ORDER — METHOCARBAMOL 1000 MG/10ML IJ SOLN: 500.0000 mg | Freq: Three times a day (TID) | INTRAMUSCULAR | Status: DC | PRN

## 2024-06-16 MED ORDER — MORPHINE SULFATE (PF) 2 MG/ML IV SOLN: 0.5000 mg | INTRAVENOUS | Status: DC | PRN

## 2024-06-16 MED ORDER — CEFAZOLIN SODIUM-DEXTROSE 2-4 GM/100ML-% IV SOLN
2.0000 g | Freq: Three times a day (TID) | INTRAVENOUS | Status: AC
  Administered 2024-06-16 – 2024-06-17 (×3): 2 g via INTRAVENOUS
  Filled 2024-06-16 (×3): qty 100

## 2024-06-16 MED ORDER — ONDANSETRON HCL 4 MG PO TABS
4.0000 mg | ORAL_TABLET | Freq: Four times a day (QID) | ORAL | Status: DC | PRN
  Administered 2024-06-17: 4 mg via ORAL
  Filled 2024-06-16: qty 1

## 2024-06-16 MED ORDER — ONDANSETRON HCL 4 MG/2ML IJ SOLN: 4.0000 mg | Freq: Four times a day (QID) | INTRAMUSCULAR | Status: DC | PRN

## 2024-06-16 MED ORDER — ONDANSETRON HCL 4 MG/2ML IJ SOLN
INTRAMUSCULAR | Status: AC
  Filled 2024-06-16: qty 2

## 2024-06-16 MED ORDER — LIDOCAINE 2% (20 MG/ML) 5 ML SYRINGE
INTRAMUSCULAR | Status: DC | PRN
  Administered 2024-06-16: 60 mg via INTRAVENOUS

## 2024-06-16 MED ORDER — FENTANYL CITRATE (PF) 250 MCG/5ML IJ SOLN
INTRAMUSCULAR | Status: DC | PRN
  Administered 2024-06-16 (×2): 50 ug via INTRAVENOUS

## 2024-06-16 MED ORDER — PROPOFOL 10 MG/ML IV BOLUS
INTRAVENOUS | Status: DC | PRN
  Administered 2024-06-16: 80 ug/kg/min via INTRAVENOUS

## 2024-06-16 MED ORDER — METHOCARBAMOL 500 MG PO TABS
500.0000 mg | ORAL_TABLET | Freq: Three times a day (TID) | ORAL | Status: DC | PRN
  Filled 2024-06-16: qty 1

## 2024-06-16 MED ORDER — TRANEXAMIC ACID-NACL 1000-0.7 MG/100ML-% IV SOLN
1000.0000 mg | Freq: Once | INTRAVENOUS | Status: AC
  Administered 2024-06-16: 1000 mg via INTRAVENOUS
  Filled 2024-06-16: qty 100

## 2024-06-16 MED ORDER — CEFAZOLIN SODIUM-DEXTROSE 2-3 GM-%(50ML) IV SOLR
INTRAVENOUS | Status: DC | PRN
  Administered 2024-06-16: 2 g via INTRAVENOUS

## 2024-06-16 MED ORDER — HYDROCODONE-ACETAMINOPHEN 5-325 MG PO TABS: 1.0000 | ORAL_TABLET | Freq: Four times a day (QID) | ORAL | Status: DC | PRN

## 2024-06-16 MED ORDER — VANCOMYCIN HCL 1000 MG IV SOLR
INTRAVENOUS | Status: AC
  Filled 2024-06-16: qty 20

## 2024-06-16 MED ORDER — ACETAMINOPHEN 10 MG/ML IV SOLN
INTRAVENOUS | Status: DC | PRN
  Administered 2024-06-16: 1000 mg via INTRAVENOUS

## 2024-06-16 MED ORDER — AMISULPRIDE (ANTIEMETIC) 5 MG/2ML IV SOLN: 10.0000 mg | Freq: Once | INTRAVENOUS | Status: DC | PRN

## 2024-06-16 MED ORDER — ONDANSETRON HCL 4 MG/2ML IJ SOLN
INTRAMUSCULAR | Status: DC | PRN
  Administered 2024-06-16: 4 mg via INTRAVENOUS

## 2024-06-16 MED ORDER — HYDROCODONE-ACETAMINOPHEN 5-325 MG PO TABS
1.0000 | ORAL_TABLET | Freq: Four times a day (QID) | ORAL | Status: DC | PRN
  Administered 2024-06-17 – 2024-06-22 (×6): 1 via ORAL
  Filled 2024-06-16 (×7): qty 1

## 2024-06-16 MED ORDER — ENOXAPARIN SODIUM 40 MG/0.4ML IJ SOSY: 40.0000 mg | PREFILLED_SYRINGE | INTRAMUSCULAR | Status: DC

## 2024-06-16 MED ORDER — 0.9 % SODIUM CHLORIDE (POUR BTL) OPTIME
TOPICAL | Status: DC | PRN
  Administered 2024-06-16: 1000 mL

## 2024-06-16 MED ORDER — DOCUSATE SODIUM 100 MG PO CAPS
100.0000 mg | ORAL_CAPSULE | Freq: Two times a day (BID) | ORAL | Status: DC
  Administered 2024-06-16 – 2024-06-22 (×11): 100 mg via ORAL
  Filled 2024-06-16 (×12): qty 1

## 2024-06-16 MED ORDER — CEFAZOLIN SODIUM-DEXTROSE 2-4 GM/100ML-% IV SOLN
INTRAVENOUS | Status: AC
  Administered 2024-06-16: 2000 mg
  Filled 2024-06-16: qty 100

## 2024-06-16 MED ORDER — METOCLOPRAMIDE HCL 5 MG PO TABS: 5.0000 mg | ORAL_TABLET | Freq: Three times a day (TID) | ORAL | Status: DC | PRN

## 2024-06-16 MED ORDER — ENOXAPARIN SODIUM 40 MG/0.4ML IJ SOSY
40.0000 mg | PREFILLED_SYRINGE | INTRAMUSCULAR | Status: DC
  Administered 2024-06-17 – 2024-06-22 (×6): 40 mg via SUBCUTANEOUS
  Filled 2024-06-16 (×6): qty 0.4

## 2024-06-16 MED ORDER — SUGAMMADEX SODIUM 200 MG/2ML IV SOLN
INTRAVENOUS | Status: AC
  Filled 2024-06-16: qty 2

## 2024-06-16 MED ORDER — ROCURONIUM BROMIDE 10 MG/ML (PF) SYRINGE
PREFILLED_SYRINGE | INTRAVENOUS | Status: AC
  Filled 2024-06-16: qty 10

## 2024-06-16 MED ORDER — LEVOTHYROXINE SODIUM 50 MCG PO TABS
50.0000 ug | ORAL_TABLET | Freq: Every day | ORAL | Status: DC
  Administered 2024-06-17 – 2024-06-22 (×6): 50 ug via ORAL
  Filled 2024-06-16 (×6): qty 1

## 2024-06-16 MED ORDER — FENTANYL CITRATE (PF) 100 MCG/2ML IJ SOLN
INTRAMUSCULAR | Status: AC
  Filled 2024-06-16: qty 2

## 2024-06-16 MED ORDER — DIPHENHYDRAMINE HCL 12.5 MG/5ML PO ELIX: 12.5000 mg | ORAL_SOLUTION | ORAL | Status: DC | PRN

## 2024-06-16 MED ORDER — CHLORHEXIDINE GLUCONATE 0.12 % MT SOLN
OROMUCOSAL | Status: AC
  Administered 2024-06-16: 15 mL via OROMUCOSAL
  Filled 2024-06-16: qty 15

## 2024-06-16 MED ORDER — ATORVASTATIN CALCIUM 80 MG PO TABS
80.0000 mg | ORAL_TABLET | Freq: Every day | ORAL | Status: DC
  Administered 2024-06-16 – 2024-06-22 (×7): 80 mg via ORAL
  Filled 2024-06-16 (×7): qty 1

## 2024-06-16 MED ORDER — LACTATED RINGERS IV SOLN: INTRAVENOUS | Status: DC

## 2024-06-16 MED ORDER — FENTANYL CITRATE (PF) 100 MCG/2ML IJ SOLN: 25.0000 ug | INTRAMUSCULAR | Status: DC | PRN

## 2024-06-16 MED ORDER — INFLUENZA VAC SPLIT HIGH-DOSE 0.5 ML IM SUSY
0.5000 mL | PREFILLED_SYRINGE | INTRAMUSCULAR | Status: AC
  Administered 2024-06-17: 0.5 mL via INTRAMUSCULAR
  Filled 2024-06-16: qty 0.5

## 2024-06-16 MED ORDER — PHENYLEPHRINE HCL-NACL 20-0.9 MG/250ML-% IV SOLN
INTRAVENOUS | Status: DC | PRN
  Administered 2024-06-16: 40 ug/min via INTRAVENOUS

## 2024-06-16 MED ORDER — ACETAMINOPHEN 325 MG PO TABS
325.0000 mg | ORAL_TABLET | Freq: Four times a day (QID) | ORAL | Status: DC | PRN
  Administered 2024-06-19: 650 mg via ORAL
  Filled 2024-06-16: qty 2

## 2024-06-16 MED ORDER — ASPIRIN 81 MG PO TBEC
81.0000 mg | DELAYED_RELEASE_TABLET | Freq: Every day | ORAL | Status: DC
  Administered 2024-06-17 – 2024-06-22 (×6): 81 mg via ORAL
  Filled 2024-06-16 (×6): qty 1

## 2024-06-16 MED ORDER — OXYCODONE HCL 5 MG PO TABS: 5.0000 mg | ORAL_TABLET | Freq: Once | ORAL | Status: DC | PRN

## 2024-06-16 MED ORDER — METOCLOPRAMIDE HCL 5 MG/ML IJ SOLN: 5.0000 mg | Freq: Three times a day (TID) | INTRAMUSCULAR | Status: DC | PRN

## 2024-06-16 MED ORDER — ORAL CARE MOUTH RINSE: 15.0000 mL | Freq: Once | OROMUCOSAL | Status: AC

## 2024-06-16 MED ORDER — OXYCODONE HCL 5 MG/5ML PO SOLN: 5.0000 mg | Freq: Once | ORAL | Status: DC | PRN

## 2024-06-16 MED ORDER — POLYETHYLENE GLYCOL 3350 17 G PO PACK
17.0000 g | PACK | Freq: Every day | ORAL | Status: DC | PRN
  Administered 2024-06-19: 17 g via ORAL
  Filled 2024-06-16: qty 1

## 2024-06-16 MED ORDER — PHENYLEPHRINE 80 MCG/ML (10ML) SYRINGE FOR IV PUSH (FOR BLOOD PRESSURE SUPPORT)
PREFILLED_SYRINGE | INTRAVENOUS | Status: DC | PRN
  Administered 2024-06-16: 80 ug via INTRAVENOUS
  Administered 2024-06-16: 120 ug via INTRAVENOUS

## 2024-06-16 MED ORDER — SODIUM CHLORIDE 0.9 % IV SOLN: INTRAVENOUS | Status: AC

## 2024-06-16 MED ORDER — ROCURONIUM BROMIDE 10 MG/ML (PF) SYRINGE
PREFILLED_SYRINGE | INTRAVENOUS | Status: DC | PRN
  Administered 2024-06-16: 40 mg via INTRAVENOUS

## 2024-06-16 MED ORDER — CHLORHEXIDINE GLUCONATE 0.12 % MT SOLN: 15.0000 mL | Freq: Once | OROMUCOSAL | Status: AC

## 2024-06-16 MED ORDER — DEXAMETHASONE SOD PHOSPHATE PF 10 MG/ML IJ SOLN
INTRAMUSCULAR | Status: DC | PRN
  Administered 2024-06-16: 8 mg via INTRAVENOUS

## 2024-06-16 MED ORDER — ONDANSETRON HCL 4 MG/2ML IJ SOLN
4.0000 mg | Freq: Once | INTRAMUSCULAR | Status: AC
  Administered 2024-06-16: 4 mg via INTRAVENOUS
  Filled 2024-06-16: qty 2

## 2024-06-16 MED ORDER — SUGAMMADEX SODIUM 200 MG/2ML IV SOLN
INTRAVENOUS | Status: DC | PRN
  Administered 2024-06-16: 200 mg via INTRAVENOUS

## 2024-06-16 SURGICAL SUPPLY — 37 items
BIT DRILL INTERTAN LAG SCREW (BIT) IMPLANT
BIT DRILL LONG 4.0 (BIT) IMPLANT
BRUSH SCRUB EZ PLAIN DRY (MISCELLANEOUS) ×2 IMPLANT
CHLORAPREP W/TINT 26 (MISCELLANEOUS) ×1 IMPLANT
COVER PERINEAL POST (MISCELLANEOUS) ×1 IMPLANT
COVER SURGICAL LIGHT HANDLE (MISCELLANEOUS) ×1 IMPLANT
DERMABOND ADVANCED .7 DNX12 (GAUZE/BANDAGES/DRESSINGS) ×1 IMPLANT
DRAPE C-ARM 35X43 STRL (DRAPES) ×1 IMPLANT
DRAPE IMP U-DRAPE 54X76 (DRAPES) ×2 IMPLANT
DRAPE INCISE IOBAN 66X45 STRL (DRAPES) ×1 IMPLANT
DRAPE STERI IOBAN 125X83 (DRAPES) ×1 IMPLANT
DRAPE SURG 17X23 STRL (DRAPES) ×2 IMPLANT
DRAPE U-SHAPE 47X51 STRL (DRAPES) ×1 IMPLANT
DRESSING MEPILEX FLEX 4X4 (GAUZE/BANDAGES/DRESSINGS) ×1 IMPLANT
DRSG MEPILEX POST OP 4X8 (GAUZE/BANDAGES/DRESSINGS) ×1 IMPLANT
ELECTRODE REM PT RTRN 9FT ADLT (ELECTROSURGICAL) ×1 IMPLANT
GLOVE BIO SURGEON STRL SZ 6.5 (GLOVE) ×3 IMPLANT
GLOVE BIO SURGEON STRL SZ7.5 (GLOVE) ×4 IMPLANT
GLOVE BIOGEL PI IND STRL 6.5 (GLOVE) ×1 IMPLANT
GLOVE BIOGEL PI IND STRL 7.5 (GLOVE) ×1 IMPLANT
GOWN STRL REUS W/ TWL LRG LVL3 (GOWN DISPOSABLE) ×1 IMPLANT
KIT BASIN OR (CUSTOM PROCEDURE TRAY) ×1 IMPLANT
KIT TURNOVER KIT B (KITS) ×1 IMPLANT
MANIFOLD NEPTUNE II (INSTRUMENTS) ×1 IMPLANT
NAIL INTERTAN 10X18 130D 10S (Nail) IMPLANT
PACK GENERAL/GYN (CUSTOM PROCEDURE TRAY) ×1 IMPLANT
PAD ARMBOARD POSITIONER FOAM (MISCELLANEOUS) ×2 IMPLANT
PIN GUIDE 3.2X343MM (PIN) IMPLANT
SCREW LAG COMPR KIT 100/95 (Screw) IMPLANT
SCREW TRIGEN LOW PROF 5.0X30 (Screw) IMPLANT
SOLN 0.9% NACL POUR BTL 1000ML (IV SOLUTION) ×1 IMPLANT
SOLN STERILE WATER BTL 1000 ML (IV SOLUTION) ×1 IMPLANT
SUT MNCRL AB 3-0 PS2 18 (SUTURE) ×1 IMPLANT
SUT MON AB 2-0 CT1 36 (SUTURE) IMPLANT
SUT VIC AB 0 CT1 27XBRD ANBCTR (SUTURE) IMPLANT
SUT VIC AB 2-0 CT1 TAPERPNT 27 (SUTURE) ×2 IMPLANT
TOWEL GREEN STERILE (TOWEL DISPOSABLE) ×2 IMPLANT

## 2024-06-16 NOTE — Interval H&P Note (Signed)
 History and Physical Interval Note:  06/16/2024 10:58 AM  [name]  has presented today for surgery, with the diagnosis of Right intertrochanteric femur fracture.  The various methods of treatment have been discussed with the patient and family. After consideration of risks, benefits and other options for treatment, the patient has consented to  Procedures: FIXATION, FRACTURE, INTERTROCHANTERIC, WITH INTRAMEDULLARY ROD (Right) as a surgical intervention.  The patient's history has been reviewed, patient examined, no change in status, stable for surgery.  I have reviewed the patient's chart and labs.  Questions were answered to the patient's satisfaction.     [name] P [name]

## 2024-06-16 NOTE — H&P (View-Only) (Signed)
 Orthopaedic Trauma Service (OTS) Consult   Patient ID: VEGAS COFFIN MRN: [id number] DOB/AGE: 1935/02/22 89 y.o.  Reason for Consult:Right intertrochanteric femur fracture Referring Physician: Dr. Lonni Seats, [facility]  HPI: Natasha Harper is an 89 y.o. female who is being seen in consultation at the request of Dr. Seats for evaluation of right hip fracture.  She sustained a ground-level fall and sustained a right intertrochanteric femur fracture.  She presents to the emergency room and x-rays showed this and I was consulted.  Patient was seen and evaluated in the emergency room earlier today.  She had a previous hip fracture that was treated with cephalomedullary nailing with Dr. Vernetta in 2 years ago.  She had done decently from this.  Her husband is at bedside and she lives at home with him.  Past Medical History:  Diagnosis Date   CAD (coronary artery disease)    PCI to RCA and diagonal in remote past, residual 70% LAD  /   nuclear, 2007, no ischemia   Carotid arterial disease    Doppler, June, 2011, stable, 60-79% R. ICA, 40-59% LICA   Compression fracture of thoracic vertebra, closed, initial encounter (HCC) 04/13/2021   Contact lens/glasses fitting    wears contacts or glasses   Dyslipidemia    Significant drop in LDL from Lipitor  even though LDL remains high   Ejection fraction    EF 65%, nuclear, 2007   Fibrocystic breast    Hip fracture (HCC) 09/05/2021   HTN (hypertension)     no med   Hypercholesterolemia    Hypothyroidism    Thyroid  surgery in the past, thyroid  nodules followed by Natasha Harper   Lump or mass in breast 07/19/2007   Excised 01/12/13. B9 on pathQualifier: Diagnosis of  By: Natasha Harper, [facility]asha [facility]     Osteoporosis    Prominent abdominal aortic pulsation    No abdominal aneurysm by ultrasound   Rectal fissure    Sacral fracture, closed (HCC) 04/13/2021    Past Surgical History:  Procedure Laterality Date   BREAST BIOPSY Right 01/12/2013    Procedure: Removal of right breast mass;  Surgeon: Natasha JINNY Laughter, Harper;  Location: [facility];  Service: General;  Laterality: Right;   BREAST EXCISIONAL BIOPSY  9/14   scar tissue from the needle biopsy   CORONARY ANGIOPLASTY  1993   FEMUR IM NAIL Left 09/05/2021   Procedure: orif left hip;  Surgeon: Natasha Natasha GRADE, Harper;  Location: [facility];  Service: Orthopedics;  Laterality: Left;  fracture table, carm, shower curtain,smith and nephew   IR KYPHO THORACIC WITH BONE BIOPSY  07/11/2021   RECTOPERITONEAL FISTULA CLOSURE     THYROID  CYST EXCISION      Family History  Problem Relation Age of Onset   Heart attack Father    Osteoporosis Mother    Breast cancer Daughter     Social History:  reports that she quit smoking about 65 years ago. Her smoking use included cigarettes. She has never used smokeless tobacco. She reports that she does not drink alcohol and does not use drugs.  Allergies: Allergies[1]  Medications: Medications Ordered Prior to Encounter[2]   ROS: AS above  Exam: Blood pressure (!) 167/73, pulse 95, temperature (!) 97.5 F (36.4 C), temperature source Oral, resp. rate (!) 30, height 5' 2 (1.575 m), weight 54.4 kg, last menstrual period 07/07/1997, SpO2 97%. General: No acute distress Orientation: Awake alert and oriented x 3 Mood and Affect: Cooperative and  pleasant Gait: Unable to assess due to her fracture. Coordination and balance: Within normal limits  Right lower extremity: Leg is shortened and externally rotated.  No obvious deformity.  She is able to dorsiflex and plantarflex her foot and ankle.  She is warm well-perfused foot.  Her diminished pulses on that DP and PT side but I was able to palpate them.  She endorses sensation to the foot and ankle.  Left lower extremity: Skin without lesions. No tenderness to palpation. Full painless ROM, full strength in each muscle groups without evidence of instability.   Medical Decision  Making: Data: Imaging: X-rays have been reviewed and CT scan of the pelvis show right intertrochanteric femur fracture.  Bilateral superior pubic ramus fractures.  Labs:  Results for orders placed or performed during the hospital encounter of 06/16/24 (from the past 24 hours)  CBC with Differential     Status: Abnormal   Collection Time: 06/16/24  5:02 AM  Result Value Ref Range   WBC 7.6 4.0 - 10.5 K/uL   RBC 3.09 (L) 3.87 - 5.11 MIL/uL   Hemoglobin 10.9 (L) 12.0 - 15.0 g/dL   HCT 66.4 (L) 63.9 - 53.9 %   MCV 108.4 (H) 80.0 - 100.0 fL   MCH 35.3 (H) 26.0 - 34.0 pg   MCHC 32.5 30.0 - 36.0 g/dL   RDW 86.6 88.4 - 84.4 %   Platelets 246 150 - 400 K/uL   nRBC 0.0 0.0 - 0.2 %   Neutrophils Relative % 90 %   Neutro Abs 6.7 1.7 - 7.7 K/uL   Lymphocytes Relative 6 %   Lymphs Abs 0.5 (L) 0.7 - 4.0 K/uL   Monocytes Relative 4 %   Monocytes Absolute 0.3 0.1 - 1.0 K/uL   Eosinophils Relative 0 %   Eosinophils Absolute 0.0 0.0 - 0.5 K/uL   Basophils Relative 0 %   Basophils Absolute 0.0 0.0 - 0.1 K/uL   Immature Granulocytes 0 %   Abs Immature Granulocytes 0.01 0.00 - 0.07 K/uL  Basic metabolic panel     Status: Abnormal   Collection Time: 06/16/24  5:02 AM  Result Value Ref Range   Sodium 136 135 - 145 mmol/L   Potassium 4.1 3.5 - 5.1 mmol/L   Chloride 103 98 - 111 mmol/L   CO2 24 22 - 32 mmol/L   Glucose, Bld 126 (H) 70 - 99 mg/dL   BUN 18 8 - 23 mg/dL   Creatinine, Ser 9.57 (L) 0.44 - 1.00 mg/dL   Calcium  8.5 (L) 8.9 - 10.3 mg/dL   GFR, Estimated >39 >39 mL/min   Anion gap 9 5 - 15     Imaging or Labs ordered: CT scan of pelvis  Medical history and chart was reviewed and case discussed with medical provider.  Assessment/Plan: 89 year old female with right intertrochanteric femur fracture.  Due to the unstable nature of her injury I recommend proceeding with cephalomedullary nailing of the right hip.  Risks and benefits were discussed with the patient.  Risks include but not  limited to bleeding, infection, malunion, nonunion, hardware failure, hardware rotation, nerve and blood vessel injury, DVT, even the possibility anesthetic complications.  She agreed to proceed with surgery consent was obtained.  Surgery w/ risks or Emergency surgery: High complexity Risk (Level 5)  Franky MYRTIS Light, [facility] [phone] (office) [email or web address]      [1]  Allergies Allergen Reactions   Actonel [Risedronate Sodium] Other (See Comments)    Bloating  Evista [Raloxifene] Other (See Comments)    Abdominal cramps   Fosamax [Alendronate Sodium] Other (See Comments)    Abdominal cramps  [2]  No current facility-administered medications on file prior to encounter.   Current Outpatient Medications on File Prior to Encounter  Medication Sig Dispense Refill   acetaminophen  (TYLENOL ) 500 MG tablet Take 2 tablets (1,000 mg total) by mouth every 8 (eight) hours.     albuterol  (PROVENTIL ) (2.5 MG/3ML) 0.083% nebulizer solution Take 3 mLs (2.5 mg total) by nebulization every 6 (six) hours as needed for wheezing.     amLODipine  (NORVASC ) 5 MG tablet Take 1 tablet (5 mg total) by mouth daily.     ascorbic acid  (VITAMIN C ) 500 MG tablet Take 500 mg by mouth daily.     aspirin  EC 81 MG tablet Take 81 mg by mouth daily. Swallow whole.     atorvastatin  (LIPITOR ) 80 MG tablet TAKE 1 TABLET BY MOUTH DAILY 90 tablet 3   atorvastatin  (LIPITOR ) 80 MG tablet TAKE 1 TABLET BY MOUTH DAILY 90 tablet 0   Coenzyme Q10 (COQ10 PO) Take 1 tablet by mouth daily.     docusate sodium  (COLACE) 100 MG capsule Take 100 mg by mouth daily as needed for mild constipation.     fish oil-omega-3 fatty acids  1000 MG capsule Take 1,000 mg by mouth daily.     lidocaine  4 % Place 1 patch onto the skin daily as needed (Pain).     MAGNESIUM  PO Take 1 tablet by mouth daily at 12 noon.     Menaquinone-7 (VITAMIN K2 PO) Take 1 tablet by mouth daily. Vitamin D3     metoprolol  tartrate  (LOPRESSOR ) 25 MG tablet Take 1 tablet (25 mg total) by mouth 2 (two) times daily.     MULTIPLE VITAMIN PO Take 1 tablet by mouth daily.     oxyCODONE  (OXY IR/ROXICODONE ) 5 MG immediate release tablet Take 1 tablet (5 mg total) by mouth every 4 (four) hours as needed for moderate pain (pain score 4-6) or severe pain (pain score 7-10). 4 tablet 0   SYNTHROID  50 MCG tablet TAKE 1 TABLET BY MOUTH DAILY 90 tablet 3   SYNTHROID  50 MCG tablet TAKE 1 TABLET BY MOUTH DAILY 90 tablet 0   traMADol  (ULTRAM ) 50 MG tablet Take 1 tablet (50 mg total) by mouth every 8 (eight) hours. 10 tablet 0

## 2024-06-16 NOTE — H&P (Signed)
 History and Physical    Patient: Natasha Harper FMW:[id number] DOB: 1935/04/30 DOA: 06/16/2024 DOS: the patient was seen and examined on 06/16/2024 PCP: Charlett Apolinar POUR, MD  Patient coming from: Home via EMS  Chief Complaint:  Chief Complaint  Patient presents with   Fall   HPI: Natasha Harper is a 89 y.o. female with medical history significant of hypertension, hyperlipidemia, CAD, hypothyroidism, left hip fracture s/p ORIFpresents with a fall resulting in fractures of the left hip and tailbone.  She experienced a fall after losing her balance, resulting in fractures of the left hip and tailbone. A family member was in another room at the time and did not witness the fall directly. She has a history of a previous left hip fracture approximately two years ago.  Prior to the fall, she was using a cane for mobility. There is no report of head injury or loss of consciousness associated with the fall.  Her current medications include atorvastatin  and levothyroxine .  No cough or shortness of breath.  In the emergency department patient was noted to be afebrile with blood pressures elevated up to 155/59, and all other vital signs maintained.  Labs including CBC and BMP appear near patient's baseline.  X-ray imaging revealed acute impacted intertrochanteric proximal right femoral fracture, suspected nondisplaced fracture of the S4 and S5 junction, and healed chronic fracture deformity of the bilateral pubic rami.  Dr. Kendal of orthopedics was consulted and recommended.   Review of Systems: As mentioned in the history of present illness. All other systems reviewed and are negative. Past Medical History:  Diagnosis Date   CAD (coronary artery disease)    PCI to RCA and diagonal in remote past, residual 70% LAD  /   nuclear, 2007, no ischemia   Carotid arterial disease    Doppler, June, 2011, stable, 60-79% R. ICA, 40-59% LICA   Compression fracture of thoracic vertebra, closed, initial  encounter (HCC) 04/13/2021   Contact lens/glasses fitting    wears contacts or glasses   Dyslipidemia    Significant drop in LDL from Lipitor  even though LDL remains high   Ejection fraction    EF 65%, nuclear, 2007   Fibrocystic breast    Hip fracture (HCC) 09/05/2021   HTN (hypertension)     no med   Hypercholesterolemia    Hypothyroidism    Thyroid  surgery in the past, thyroid  nodules followed by Dr.Ellison   Lump or mass in breast 07/19/2007   Excised 01/12/13. B9 on pathQualifier: Diagnosis of  By: Charlett MD, Apolinar POUR     Osteoporosis    Prominent abdominal aortic pulsation    No abdominal aneurysm by ultrasound   Rectal fissure    Sacral fracture, closed (HCC) 04/13/2021   Past Surgical History:  Procedure Laterality Date   BREAST BIOPSY Right 01/12/2013   Procedure: Removal of right breast mass;  Surgeon: Sherlean JINNY Laughter, MD;  Location: [facility];  Service: General;  Laterality: Right;   BREAST EXCISIONAL BIOPSY  9/14   scar tissue from the needle biopsy   CORONARY ANGIOPLASTY  1993   FEMUR IM NAIL Left 09/05/2021   Procedure: orif left hip;  Surgeon: Vernetta Lonni GRADE, MD;  Location: [facility];  Service: Orthopedics;  Laterality: Left;  fracture table, carm, shower curtain,Shauntae Reitman and nephew   IR KYPHO THORACIC WITH BONE BIOPSY  07/11/2021   RECTOPERITONEAL FISTULA CLOSURE     THYROID  CYST EXCISION     Social History:  reports that  she quit smoking about 65 years ago. Her smoking use included cigarettes. She has never used smokeless tobacco. She reports that she does not drink alcohol and does not use drugs.  Allergies[1]  Family History  Problem Relation Age of Onset   Heart attack Father    Osteoporosis Mother    Breast cancer Daughter     Prior to Admission medications  Medication Sig Start Date End Date Taking? Authorizing Provider  acetaminophen  (TYLENOL ) 500 MG tablet Take 2 tablets (1,000 mg total) by mouth every 8 (eight) hours. 10/12/23    Vann, Jessica U, DO  albuterol  (PROVENTIL ) (2.5 MG/3ML) 0.083% nebulizer solution Take 3 mLs (2.5 mg total) by nebulization every 6 (six) hours as needed for wheezing. 10/12/23   Vann, Jessica U, DO  amLODipine  (NORVASC ) 5 MG tablet Take 1 tablet (5 mg total) by mouth daily. 10/12/23   Vann, Jessica U, DO  ascorbic acid  (VITAMIN C ) 500 MG tablet Take 500 mg by mouth daily.    [provider]  aspirin  EC 81 MG tablet Take 81 mg by mouth daily. Swallow whole.    [provider]  atorvastatin  (LIPITOR ) 80 MG tablet TAKE 1 TABLET BY MOUTH DAILY 01/03/24   Panosh, [name], MD  atorvastatin  (LIPITOR ) 80 MG tablet TAKE 1 TABLET BY MOUTH DAILY 12/31/23   Panosh, [name], MD  Coenzyme Q10 (COQ10 PO) Take 1 tablet by mouth daily.    [provider]  docusate sodium  (COLACE) 100 MG capsule Take 100 mg by mouth daily as needed for mild constipation.    [provider]  fish oil-omega-3 fatty acids  1000 MG capsule Take 1,000 mg by mouth daily.    [provider]  lidocaine  4 % Place 1 patch onto the skin daily as needed (Pain).    [provider]  MAGNESIUM  PO Take 1 tablet by mouth daily at 12 noon.    [provider]  Menaquinone-7 (VITAMIN K2 PO) Take 1 tablet by mouth daily. Vitamin D3    [provider]  metoprolol  tartrate (LOPRESSOR ) 25 MG tablet Take 1 tablet (25 mg total) by mouth 2 (two) times daily. 10/12/23   Vann, Jessica U, DO  MULTIPLE VITAMIN PO Take 1 tablet by mouth daily.    [provider]  oxyCODONE  (OXY IR/ROXICODONE ) 5 MG immediate release tablet Take 1 tablet (5 mg total) by mouth every 4 (four) hours as needed for moderate pain (pain score 4-6) or severe pain (pain score 7-10). 10/12/23   Juvenal Harlene PENNER, DO  SYNTHROID  50 MCG tablet TAKE 1 TABLET BY MOUTH DAILY 01/03/24   Panosh, [name], MD  SYNTHROID  50 MCG tablet TAKE 1 TABLET BY MOUTH DAILY 12/31/23   Panosh, [name], MD  traMADol  (ULTRAM ) 50 MG tablet Take 1  tablet (50 mg total) by mouth every 8 (eight) hours. 10/12/23   Juvenal Harlene PENNER, DO    Physical Exam: Vitals:   06/16/24 0253 06/16/24 0645 06/16/24 0700 06/16/24 0715  BP:  (!) 153/56 (!) 148/63 (!) 145/71  Pulse:  88 92 80  Resp:   18   Temp:   98.3 F (36.8 C)   TempSrc:   Oral   SpO2:  97% 97% 93%  Weight: 54.4 kg     Height: 5' 2 (1.575 m)      Constitutional: Elderly female currently in no acute distress Eyes: PERRL, lids and conjunctivae normal ENMT: Mucous membranes are moist. Normal dentition.  Hard of hearing Neck: normal,  supple,   Respiratory: clear to auscultation bilaterally, no wheezing, no crackles. Normal respiratory effort. No accessory muscle use.  Cardiovascular: Regular rate and rhythm, no murmurs / rubs / gallops. No extremity edema. 2+ pedal pulses.   Abdomen: no tenderness, no masses palpated. No hepatosplenomegaly. Bowel sounds positive.  Musculoskeletal: no clubbing / cyanosis.  Right leg externally rotated and mildly shortened. Skin: Bruising noted on the lateral aspect of the left right hip. Neurologic: CN 2-12 grossly intact.  Strength 5/5 in all 4.  Psychiatric: Normal judgment and insight. Alert and oriented x 3. Normal mood.   Data Reviewed:  Reviewed labs, imaging, and pertinent records as documented  Assessment and Plan:\  Intertrochanteric proximal right femoral and sacral fracture secondary to fall Acute.  Patient presents with right hip pain after having a fall at home.  Reportedly lost her balance.  Denied any loss of consciousness. X-rays revealed acute intertrochanter right femur fracture and suspected nondisplaced sacral fracture.  Orthopedics was consulted and plan on taking patient to surgery. - Admit to a surgical telemetry - Hip fracture order set utilized - N.p.o - Follow-up CT of the pelvis - Hydrocodone /morphine  IV as needed for mild to moderate pain respectively - Consult anesthesia per nerve block - Appreciate orthopedic  consultative services, follow-up for any further recommendation  CAD Hyperlipidemia - Continue aspirin  and atorvastatin   Macrocytic anemia Chronic.  Hemoglobin 10.9 with MCV 108.4 and MCH 35.3. - Check vitamin B12 level as this could put patient at increased risk for falls - Continue to monitor H&H  Hypothyroidism - Continue levothyroxine   Hard of hearing  DVT prophylaxis: Lovenox  Advance Care Planning:   Code Status: Full Code   Consults: Orthopedics  Family Communication: Husband updated at bedside  Severity of Illness: The appropriate patient status for this patient is INPATIENT. Inpatient status is judged to be reasonable and necessary in order to provide the required intensity of service to ensure the patient's safety. The patient's presenting symptoms, physical exam findings, and initial radiographic and laboratory data in the context of their chronic comorbidities is felt to place them at high risk for further clinical deterioration. Furthermore, it is not anticipated that the patient will be medically stable for discharge from the hospital within 2 midnights of admission.   * I certify that at the point of admission it is my clinical judgment that the patient will require inpatient hospital care spanning beyond 2 midnights from the point of admission due to high intensity of service, high risk for further deterioration and high frequency of surveillance required.*  Author: Maximino DELENA Sharps, MD 06/16/2024 7:52 AM  For on call review [email or web address].      [1]  Allergies Allergen Reactions   Actonel [Risedronate Sodium] Other (See Comments)    Bloating   Evista [Raloxifene] Other (See Comments)    Abdominal cramps   Fosamax [Alendronate Sodium] Other (See Comments)    Abdominal cramps

## 2024-06-16 NOTE — Plan of Care (Signed)
  Problem: Education: Goal: Knowledge of General Education information will improve Description: Including pain rating scale, medication(s)/side effects and non-pharmacologic comfort measures Outcome: Progressing   Problem: Clinical Measurements: Goal: Will remain free from infection Outcome: Progressing   Problem: Activity: Goal: Risk for activity intolerance will decrease Outcome: Progressing   Problem: Nutrition: Goal: Adequate nutrition will be maintained Outcome: Progressing   Problem: Coping: Goal: Level of anxiety will decrease Outcome: Progressing   Problem: Elimination: Goal: Will not experience complications related to bowel motility Outcome: Progressing   Problem: Pain Managment: Goal: General experience of comfort will improve and/or be controlled Outcome: Progressing

## 2024-06-16 NOTE — ED Triage Notes (Signed)
 Pt bibgcems from home, mechanical fall from recliner. Pt lost her balance and fell on hips. C/O R anterior thigh pain . Hx of needing hip replacement on left hip. No loc, no thinners, no PMH

## 2024-06-16 NOTE — ED Provider Notes (Signed)
 [place] EMERGENCY DEPARTMENT AT [facility] Provider Note   CSN: [id number] Arrival date & time: 06/16/24  9864     Patient presents with: Natasha Harper   Natasha Harper is a 89 y.o. female.   HPI Patient is an 89 year old female presenting to the ED today for mechanical fall after getting out of bed to go use the restroom, using her cane and lost balance, noting that she normally uses a walker due to balance issues, landing on her buttocks, noting that she did not hit her head, did not lose consciousness, and has not been experiencing any other pain outside of her right hip pain and right buttocks pain.  Reports pain on right side feels similar to when she had left-sided hip fracture. Has not been able to ambulate since secondary to pain.  Previous medical history of hypothyroidism, CAD, HTN, hip fracture.  Endorses nausea secondary to pain.  Denies any numbness, weakness, tingling, headache, visual changes, vertigo, chest pain, shortness of breath, abdominal pain, vomiting.    Prior to Admission medications  Medication Sig Start Date End Date Taking? Authorizing Provider  acetaminophen  (TYLENOL ) 500 MG tablet Take 2 tablets (1,000 mg total) by mouth every 8 (eight) hours. 10/12/23   Vann, Jessica U, DO  albuterol  (PROVENTIL ) (2.5 MG/3ML) 0.083% nebulizer solution Take 3 mLs (2.5 mg total) by nebulization every 6 (six) hours as needed for wheezing. 10/12/23   Juvenal Harlene PENNER, DO  amLODipine  (NORVASC ) 5 MG tablet Take 1 tablet (5 mg total) by mouth daily. 10/12/23   Vann, Jessica U, DO  ascorbic acid  (VITAMIN C ) 500 MG tablet Take 500 mg by mouth daily.    [provider]  aspirin  EC 81 MG tablet Take 81 mg by mouth daily. Swallow whole.    [provider]  atorvastatin  (LIPITOR ) 80 MG tablet TAKE 1 TABLET BY MOUTH DAILY 01/03/24   Panosh, [name], MD  atorvastatin  (LIPITOR ) 80 MG tablet TAKE 1 TABLET BY MOUTH DAILY 12/31/23   Panosh, [name], MD  Coenzyme Q10 (COQ10  PO) Take 1 tablet by mouth daily.    [provider]  docusate sodium  (COLACE) 100 MG capsule Take 100 mg by mouth daily as needed for mild constipation.    [provider]  fish oil-omega-3 fatty acids  1000 MG capsule Take 1,000 mg by mouth daily.    [provider]  lidocaine  4 % Place 1 patch onto the skin daily as needed (Pain).    [provider]  MAGNESIUM  PO Take 1 tablet by mouth daily at 12 noon.    [provider]  Menaquinone-7 (VITAMIN K2 PO) Take 1 tablet by mouth daily. Vitamin D3    [provider]  metoprolol  tartrate (LOPRESSOR ) 25 MG tablet Take 1 tablet (25 mg total) by mouth 2 (two) times daily. 10/12/23   Vann, Jessica U, DO  MULTIPLE VITAMIN PO Take 1 tablet by mouth daily.    [provider]  oxyCODONE  (OXY IR/ROXICODONE ) 5 MG immediate release tablet Take 1 tablet (5 mg total) by mouth every 4 (four) hours as needed for moderate pain (pain score 4-6) or severe pain (pain score 7-10). 10/12/23   Juvenal Harlene PENNER, DO  SYNTHROID  50 MCG tablet TAKE 1 TABLET BY MOUTH DAILY 01/03/24   Panosh, [name], MD  SYNTHROID  50 MCG tablet TAKE 1 TABLET BY MOUTH DAILY 12/31/23   Panosh, [name], MD  traMADol  (ULTRAM ) 50 MG tablet Take 1 tablet (50 mg total) by  mouth every 8 (eight) hours. 10/12/23   Vann, Jessica U, DO    Allergies: Actonel [risedronate sodium], Evista [raloxifene], and Fosamax [alendronate sodium]    Review of Systems  Musculoskeletal:  Positive for arthralgias.  All other systems reviewed and are negative.   Updated Vital Signs BP (!) 155/59   Pulse 85   Ht 5' 2 (1.575 m)   Wt 54.4 kg   LMP 07/07/1997   SpO2 98%   BMI 21.95 kg/m   Physical Exam Vitals and nursing note reviewed.  Constitutional:      General: She is not in acute distress.    Appearance: Normal appearance. She is not ill-appearing or diaphoretic.  HENT:     Head: Normocephalic and atraumatic.  Eyes:     General: No scleral icterus.        Right eye: No discharge.        Left eye: No discharge.     Extraocular Movements: Extraocular movements intact.     Conjunctiva/sclera: Conjunctivae normal.  Cardiovascular:     Rate and Rhythm: Normal rate and regular rhythm.     Pulses: Normal pulses.     Heart sounds: Normal heart sounds. No murmur heard.    No friction rub. No gallop.  Pulmonary:     Effort: Pulmonary effort is normal. No respiratory distress.     Breath sounds: No stridor. No wheezing, rhonchi or rales.  Chest:     Chest wall: No tenderness.  Abdominal:     General: Abdomen is flat. There is no distension.     Palpations: Abdomen is soft.     Tenderness: There is no abdominal tenderness. There is no right CVA tenderness, left CVA tenderness, guarding or rebound.  Musculoskeletal:        General: Tenderness and deformity present. No swelling or signs of injury.     Cervical back: Normal range of motion. No rigidity or tenderness.     Right lower leg: No edema.     Left lower leg: No edema.     Comments: Notably has right leg inverted, with deformity noted to posterior femur.  DP pulse present, with patient endorsing normal sensation on examination.  Unable to move secondary to pain at hip.  No other injuries or tenderness noted to ankles, left leg, upper extremities.  No midline tenderness noted to lumbar, thoracic, cervical spine.  Skin:    General: Skin is warm and dry.     Findings: No bruising, erythema or lesion.  Neurological:     General: No focal deficit present.     Mental Status: She is alert and oriented to person, place, and time. Mental status is at baseline.     Sensory: No sensory deficit.     Motor: No weakness.  Psychiatric:        Mood and Affect: Mood normal.     (all labs ordered are listed, but only abnormal results are displayed) Labs Reviewed  CBC WITH DIFFERENTIAL/PLATELET - Abnormal; Notable for the following components:      Result Value   RBC 3.09 (*)    Hemoglobin 10.9  (*)    HCT 33.5 (*)    MCV 108.4 (*)    MCH 35.3 (*)    Lymphs Abs 0.5 (*)    All other components within normal limits  BASIC METABOLIC PANEL WITH GFR - Abnormal; Notable for the following components:   Glucose, Bld 126 (*)    Creatinine, Ser 0.42 (*)  Calcium  8.5 (*)    All other components within normal limits    EKG: None  Radiology: DG Pelvis Portable Result Date: 06/16/2024 EXAM: 1 or 2 VIEW(S) XRAY OF THE PELVIS 06/16/2024 03:58:00 AM COMPARISON: CT chest abdomen pelvis 10/05/2023. CLINICAL HISTORY: R hip pain post mechanical fall. FINDINGS: BONES: Left hip ORIF hardware in place. Acute displaced and impacted intertrochanteric fracture of the right hip. Proximal migration of the femoral shaft. Displacement of the lesser trochanter. Chronic fracture deformity of the bilateral pubic rami. Diffuse osteopenia. JOINTS: Degenerative changes of the visualized lower lumbar spine. There is mild symmetric arthrosis of the hip joints. SOFT TISSUES: The soft tissues are unremarkable. IMPRESSION: 1. Acute displaced and impacted intertrochanteric fracture of the right hip with proximal migration of the femoral shaft and displacement of the lesser trochanter. 2. Left hip ORIF hardware in place. 3. Chronic fracture deformity of the bilateral pubic rami. 4. Diffuse osteopenia. Electronically signed by: Francis Quam MD 06/16/2024 04:52 AM EST RP Workstation: [id number]   DG Sacrum/Coccyx Result Date: 06/16/2024 EXAM: 1 LATERAL VIEW XRAY OF THE SACRUM AND COCCYX 06/16/2024 03:58:00 AM COMPARISON: Chest abdomen and pelvis CT 10/05/2023. CLINICAL HISTORY: Fall with coccyx pain, R hip pain. FINDINGS: BONES AND JOINTS: On the lateral view, the coccyx is largely obscured by what appears to be an underlying backboard. There is diffuse osteopenia. There is a subtle cortical depression at the junction of S4 and S5 on the lateral view not clearly demonstrated on CT, which could be a nondisplaced fracture. There  is no evidence of displaced fractures. There are chronic healed fracture deformities of the bilateral pubic rami, which were previously acute. There is no visible step off in the sacral arcuate lines. No malalignment. SOFT TISSUES: The soft tissues are unremarkable. IMPRESSION: 1. Subtle cortical depression at the junction of S4 and S5 on the lateral view, possibly representing a nondisplaced fracture. 2. No other fractures identified. Diffuse osteopenia. 3. Chronic healed fracture deformities of the bilateral pubic rami, previously acute. Electronically signed by: Francis Quam MD 06/16/2024 04:14 AM EST RP Workstation: [id number]   DG Femur Min 2 Views Right Result Date: 06/16/2024 EXAM: 2 VIEW(S) XRAY OF THE RIGHT FEMUR 06/16/2024 03:56:00 AM COMPARISON: Chest abdomen pelvis CT 10/05/2023. CLINICAL HISTORY: R hip pain post mechanical fall. FINDINGS: BONES AND JOINTS: Diffuse osteopenia. Acute impacted intertrochanteric proximal right femoral fracture with cephalad displacement of the distal fragment such that the femoral neck and shaft are almost at right angles. The main distal fragment shows mild varus angulation. Mild degenerative arthrosis of the right hip and knee. The right pelvic ring is grossly intact. SOFT TISSUES: Patchy calcifications in the right superficial femoral artery. Soft tissues otherwise unremarkable. IMPRESSION: 1. Acute impacted intertrochanteric proximal right femoral fracture with cephalad displacement of the distal fragment and mild varus angulation. 2. Diffuse osteopenia. Electronically signed by: Francis Quam MD 06/16/2024 04:05 AM EST RP Workstation: [id number]    Procedures   Medications Ordered in the ED  morphine  (PF) 2 MG/ML injection 2 mg (has no administration in time range)  morphine  (PF) 2 MG/ML injection 2 mg (2 mg Intravenous Given 06/16/24 0316)  ondansetron  (ZOFRAN ) injection 4 mg (4 mg Intravenous Given 06/16/24 0315)    Clinical Course as of 06/16/24 0540   Thu Jun 16, 2024  0519 Spoke with Dr. Kendal, who requested for her to be made n.p.o., with him coming to see her sometime this morning, and to admit to medicine. [CB]    Clinical Course User  Index [CB] Beola Terrall RAMAN, PA-C  Medical Decision Making Amount and/or Complexity of Data Reviewed Labs: ordered. Radiology: ordered.  Risk Prescription drug management.  This patient is a 89 year old female who presents to the ED for concern of mechanical fall while going to the bathroom last night, falling onto her buttocks, reported that she did not hit her head, did not lose consciousness, has not able to walk since the incident.  On physical exam, patient is in no acute distress, afebrile, alert and orient x 4, speaking in full sentences, nontachypneic, nontachycardic.  Notably has deformity to right leg at thigh.  Neurovascular intact, patient endorsing appropriate sensation and having DP/TP pulses.  Unable to move secondary to pain.  No tenderness to cervical, thoracic, lumbar spine.  Unremarkable exam otherwise.  No other injuries noted otherwise.  The patient's current presentation, x-rays were done and showed intertrochanteric fracture and possible coccyx fracture.  Spoke with Dr. Kendal with orthopedic surgery who said to admit to medicine and they will round on her this morning.  On reevaluation, patient notes her nausea has resolved, still endorsing some pain, added additional morphine .  Patient care transferred to Dr. Franky, hospitalist.  Differential diagnoses prior to evaluation: The emergent differential diagnosis includes, but is not limited to, fracture, ligamentous injury, neurovascular injury, dislocation, malalignment. This is not an exhaustive differential.   Past Medical History / Co-morbidities / Social History: hypothyroidism, CAD, HTN, hip fracture.  Additional history: Chart reviewed. Pertinent results include:   Last admitted on 09/15/2023 for acute cystitis  and hypoxia.  Follow-up with PCP on/17/25.  Lab Tests/Imaging studies: I personally interpreted labs/imaging and the pertinent results include:   CBC at baseline BMP at baseline  X-ray of pelvis and femur notes an acute displaced and impacted intertrochanteric fracture right hip with proximal migration of the femoral shaft and displacement of the lesser trochanter.  Sacral/coccyx x-ray shows subtle cortical depression at junction of S4-S5 possibly nondisplaced fracture..   I agree with the radiologist interpretation.    Medications: I ordered medication including morphine , Zofran .  I have reviewed the patients home medicines and have made adjustments as needed.  Critical Interventions: None  Consultations: Spoke with orthopedic surgery Dr. Kendal  Social Determinants of Health: none   Disposition: After consideration of the diagnostic results and the patients response to treatment, I feel that the patient would benefit from admission, patient care transferred to Dr. Franky  Final diagnoses:  Fall, initial encounter  Closed displaced intertrochanteric fracture of right femur, initial encounter [facility])  Closed fracture of sacrum, unspecified portion of sacrum, initial encounter [facility])    ED Discharge Orders     None          Beola Terrall RAMAN, PA-C 06/16/24 0630    Haze Lonni PARAS, MD 06/16/24 0730

## 2024-06-16 NOTE — Anesthesia Preprocedure Evaluation (Addendum)
 Anesthesia Evaluation  Patient identified by MRN, date of birth, ID band Patient awake    Reviewed: Allergy & Precautions, NPO status , Patient's Chart, lab work & pertinent test results  History of Anesthesia Complications Negative for: history of anesthetic complications  Airway Mallampati: III  TM Distance: >3 FB Neck ROM: Full    Dental  (+) Dental Advisory Given   Pulmonary neg shortness of breath, neg sleep apnea, neg COPD, neg recent URI, former smoker   Pulmonary exam normal breath sounds clear to auscultation       Cardiovascular hypertension (amlodipine , metoprolol ), Pt. on medications and Pt. on home beta blockers (-) angina + CAD and + Past MI (years ago, reports no stents)  (-) Cardiac Stents and (-) CABG (-) dysrhythmias  Rhythm:Regular Rate:Normal  HLD, carotid arterial disease  TTE 07/05/2022: IMPRESSIONS     1. Left ventricular ejection fraction, by estimation, is 55 to 60%. The  left ventricle has normal function. The left ventricle has no regional  wall motion abnormalities. There is mild concentric left ventricular  hypertrophy. Left ventricular diastolic  parameters are consistent with Grade I diastolic dysfunction (impaired  relaxation).   2. Right ventricular systolic function is normal. The right ventricular  size is normal. There is normal pulmonary artery systolic pressure. The  estimated right ventricular systolic pressure is 31.5 mmHg.   3. A small pericardial effusion is present. The pericardial effusion is  posterior to the left ventricle.   4. The mitral valve is grossly normal. Mild mitral valve regurgitation.  No evidence of mitral stenosis.   5. The aortic valve is tricuspid. There is mild calcification of the  aortic valve. There is mild thickening of the aortic valve. Aortic valve  regurgitation is not visualized. Aortic valve sclerosis is present, with  no evidence of aortic valve  stenosis.   6. The inferior vena cava is normal in size with greater than 50%  respiratory variability, suggesting right atrial pressure of 3 mmHg.     Neuro/Psych negative neurological ROS     GI/Hepatic negative GI ROS, Neg liver ROS,,,  Endo/Other  neg diabetesHypothyroidism    Renal/GU negative Renal ROS     Musculoskeletal Osteoporosis    Abdominal   Peds  Hematology  (+) Blood dyscrasia, anemia Lab Results      Component                Value               Date                      WBC                      7.6                 06/16/2024                HGB                      10.9 (L)            06/16/2024                HCT                      33.5 (L)            06/16/2024  MCV                      108.4 (H)           06/16/2024                PLT                      246                 06/16/2024              Anesthesia Other Findings   Reproductive/Obstetrics                              Anesthesia Physical Anesthesia Plan  ASA: 3  Anesthesia Plan: General   Post-op Pain Management:    Induction: Intravenous  PONV Risk Score and Plan: 3 and Ondansetron , Dexamethasone  and Treatment may vary due to age or medical condition  Airway Management Planned: Oral ETT  Additional Equipment:   Intra-op Plan:   Post-operative Plan: Extubation in OR  Informed Consent: I have reviewed the patients History and Physical, chart, labs and discussed the procedure including the risks, benefits and alternatives for the proposed anesthesia with the patient or authorized representative who has indicated his/her understanding and acceptance.     Dental advisory given  Plan Discussed with: CRNA and Anesthesiologist  Anesthesia Plan Comments: (Risks of general anesthesia discussed including, but not limited to, sore throat, hoarse voice, chipped/damaged teeth, injury to vocal cords, nausea and vomiting, allergic reactions, lung  infection, heart attack, stroke, and death. All questions answered. )        Anesthesia Quick Evaluation

## 2024-06-16 NOTE — Transfer of Care (Signed)
 Immediate Anesthesia Transfer of Care Note  Patient: Natasha Harper  Procedure(s) Performed: FIXATION, FRACTURE, INTERTROCHANTERIC, WITH INTRAMEDULLARY ROD (Right: Hip)  Patient Location: PACU  Anesthesia Type:General  Level of Consciousness: awake, alert , and confused  Airway & Oxygen  Therapy: Patient Spontanous Breathing  Post-op Assessment: Report given to RN  Post vital signs: Reviewed and stable  Last Vitals:  Vitals Value Taken Time  BP 150/64 06/16/24 13:04  Temp 36.6 C 06/16/24 13:04  Pulse 92 06/16/24 13:10  Resp 19 06/16/24 13:10  SpO2 91 % 06/16/24 13:10  Vitals shown include unfiled device data.  Last Pain:  Vitals:   06/16/24 1304  TempSrc:   PainSc: 0-No pain         Complications: No notable events documented.

## 2024-06-16 NOTE — Consult Note (Signed)
 Orthopaedic Trauma Service (OTS) Consult   Patient ID: VEGAS COFFIN MRN: [id number] DOB/AGE: 1935/02/22 89 y.o.  Reason for Consult:Right intertrochanteric femur fracture Referring Physician: Dr. Lonni Seats, MD [name]  HPI: Natasha Harper is an 89 y.o. female who is being seen in consultation at the request of Dr. Seats for evaluation of right hip fracture.  She sustained a ground-level fall and sustained a right intertrochanteric femur fracture.  She presents to the emergency room and x-rays showed this and I was consulted.  Patient was seen and evaluated in the emergency room earlier today.  She had a previous hip fracture that was treated with cephalomedullary nailing with Dr. Vernetta in 2 years ago.  She had done decently from this.  Her husband is at bedside and she lives at home with him.  Past Medical History:  Diagnosis Date   CAD (coronary artery disease)    PCI to RCA and diagonal in remote past, residual 70% LAD  /   nuclear, 2007, no ischemia   Carotid arterial disease    Doppler, June, 2011, stable, 60-79% R. ICA, 40-59% LICA   Compression fracture of thoracic vertebra, closed, initial encounter (HCC) 04/13/2021   Contact lens/glasses fitting    wears contacts or glasses   Dyslipidemia    Significant drop in LDL from Lipitor  even though LDL remains high   Ejection fraction    EF 65%, nuclear, 2007   Fibrocystic breast    Hip fracture (HCC) 09/05/2021   HTN (hypertension)     no med   Hypercholesterolemia    Hypothyroidism    Thyroid  surgery in the past, thyroid  nodules followed by Dr.Ellison   Lump or mass in breast 07/19/2007   Excised 01/12/13. B9 on pathQualifier: Diagnosis of  By: Charlett MD, Apolinar POUR     Osteoporosis    Prominent abdominal aortic pulsation    No abdominal aneurysm by ultrasound   Rectal fissure    Sacral fracture, closed (HCC) 04/13/2021    Past Surgical History:  Procedure Laterality Date   BREAST BIOPSY Right 01/12/2013    Procedure: Removal of right breast mass;  Surgeon: Sherlean JINNY Laughter, MD;  Location: [facility];  Service: General;  Laterality: Right;   BREAST EXCISIONAL BIOPSY  9/14   scar tissue from the needle biopsy   CORONARY ANGIOPLASTY  1993   FEMUR IM NAIL Left 09/05/2021   Procedure: orif left hip;  Surgeon: Vernetta Lonni GRADE, MD;  Location: [facility];  Service: Orthopedics;  Laterality: Left;  fracture table, carm, shower curtain,smith and nephew   IR KYPHO THORACIC WITH BONE BIOPSY  07/11/2021   RECTOPERITONEAL FISTULA CLOSURE     THYROID  CYST EXCISION      Family History  Problem Relation Age of Onset   Heart attack Father    Osteoporosis Mother    Breast cancer Daughter     Social History:  reports that she quit smoking about 65 years ago. Her smoking use included cigarettes. She has never used smokeless tobacco. She reports that she does not drink alcohol and does not use drugs.  Allergies: Allergies[1]  Medications: Medications Ordered Prior to Encounter[2]   ROS: AS above  Exam: Blood pressure (!) 167/73, pulse 95, temperature (!) 97.5 F (36.4 C), temperature source Oral, resp. rate (!) 30, height 5' 2 (1.575 m), weight 54.4 kg, last menstrual period 07/07/1997, SpO2 97%. General: No acute distress Orientation: Awake alert and oriented x 3 Mood and Affect: Cooperative and  pleasant Gait: Unable to assess due to her fracture. Coordination and balance: Within normal limits  Right lower extremity: Leg is shortened and externally rotated.  No obvious deformity.  She is able to dorsiflex and plantarflex her foot and ankle.  She is warm well-perfused foot.  Her diminished pulses on that DP and PT side but I was able to palpate them.  She endorses sensation to the foot and ankle.  Left lower extremity: Skin without lesions. No tenderness to palpation. Full painless ROM, full strength in each muscle groups without evidence of instability.   Medical Decision  Making: Data: Imaging: X-rays have been reviewed and CT scan of the pelvis show right intertrochanteric femur fracture.  Bilateral superior pubic ramus fractures.  Labs:  Results for orders placed or performed during the hospital encounter of 06/16/24 (from the past 24 hours)  CBC with Differential     Status: Abnormal   Collection Time: 06/16/24  5:02 AM  Result Value Ref Range   WBC 7.6 4.0 - 10.5 K/uL   RBC 3.09 (L) 3.87 - 5.11 MIL/uL   Hemoglobin 10.9 (L) 12.0 - 15.0 g/dL   HCT 66.4 (L) 63.9 - 53.9 %   MCV 108.4 (H) 80.0 - 100.0 fL   MCH 35.3 (H) 26.0 - 34.0 pg   MCHC 32.5 30.0 - 36.0 g/dL   RDW 86.6 88.4 - 84.4 %   Platelets 246 150 - 400 K/uL   nRBC 0.0 0.0 - 0.2 %   Neutrophils Relative % 90 %   Neutro Abs 6.7 1.7 - 7.7 K/uL   Lymphocytes Relative 6 %   Lymphs Abs 0.5 (L) 0.7 - 4.0 K/uL   Monocytes Relative 4 %   Monocytes Absolute 0.3 0.1 - 1.0 K/uL   Eosinophils Relative 0 %   Eosinophils Absolute 0.0 0.0 - 0.5 K/uL   Basophils Relative 0 %   Basophils Absolute 0.0 0.0 - 0.1 K/uL   Immature Granulocytes 0 %   Abs Immature Granulocytes 0.01 0.00 - 0.07 K/uL  Basic metabolic panel     Status: Abnormal   Collection Time: 06/16/24  5:02 AM  Result Value Ref Range   Sodium 136 135 - 145 mmol/L   Potassium 4.1 3.5 - 5.1 mmol/L   Chloride 103 98 - 111 mmol/L   CO2 24 22 - 32 mmol/L   Glucose, Bld 126 (H) 70 - 99 mg/dL   BUN 18 8 - 23 mg/dL   Creatinine, Ser 9.57 (L) 0.44 - 1.00 mg/dL   Calcium  8.5 (L) 8.9 - 10.3 mg/dL   GFR, Estimated >39 >39 mL/min   Anion gap 9 5 - 15     Imaging or Labs ordered: CT scan of pelvis  Medical history and chart was reviewed and case discussed with medical provider.  Assessment/Plan: 89 year old female with right intertrochanteric femur fracture.  Due to the unstable nature of her injury I recommend proceeding with cephalomedullary nailing of the right hip.  Risks and benefits were discussed with the patient.  Risks include but not  limited to bleeding, infection, malunion, nonunion, hardware failure, hardware rotation, nerve and blood vessel injury, DVT, even the possibility anesthetic complications.  She agreed to proceed with surgery consent was obtained.  Surgery w/ risks or Emergency surgery: High complexity Risk (Level 5)  Franky MYRTIS Light, MD [facility] [phone] (office) [email or web address]      [1]  Allergies Allergen Reactions   Actonel [Risedronate Sodium] Other (See Comments)    Bloating  Evista [Raloxifene] Other (See Comments)    Abdominal cramps   Fosamax [Alendronate Sodium] Other (See Comments)    Abdominal cramps  [2]  No current facility-administered medications on file prior to encounter.   Current Outpatient Medications on File Prior to Encounter  Medication Sig Dispense Refill   acetaminophen  (TYLENOL ) 500 MG tablet Take 2 tablets (1,000 mg total) by mouth every 8 (eight) hours.     albuterol  (PROVENTIL ) (2.5 MG/3ML) 0.083% nebulizer solution Take 3 mLs (2.5 mg total) by nebulization every 6 (six) hours as needed for wheezing.     amLODipine  (NORVASC ) 5 MG tablet Take 1 tablet (5 mg total) by mouth daily.     ascorbic acid  (VITAMIN C ) 500 MG tablet Take 500 mg by mouth daily.     aspirin  EC 81 MG tablet Take 81 mg by mouth daily. Swallow whole.     atorvastatin  (LIPITOR ) 80 MG tablet TAKE 1 TABLET BY MOUTH DAILY 90 tablet 3   atorvastatin  (LIPITOR ) 80 MG tablet TAKE 1 TABLET BY MOUTH DAILY 90 tablet 0   Coenzyme Q10 (COQ10 PO) Take 1 tablet by mouth daily.     docusate sodium  (COLACE) 100 MG capsule Take 100 mg by mouth daily as needed for mild constipation.     fish oil-omega-3 fatty acids  1000 MG capsule Take 1,000 mg by mouth daily.     lidocaine  4 % Place 1 patch onto the skin daily as needed (Pain).     MAGNESIUM  PO Take 1 tablet by mouth daily at 12 noon.     Menaquinone-7 (VITAMIN K2 PO) Take 1 tablet by mouth daily. Vitamin D3     metoprolol  tartrate  (LOPRESSOR ) 25 MG tablet Take 1 tablet (25 mg total) by mouth 2 (two) times daily.     MULTIPLE VITAMIN PO Take 1 tablet by mouth daily.     oxyCODONE  (OXY IR/ROXICODONE ) 5 MG immediate release tablet Take 1 tablet (5 mg total) by mouth every 4 (four) hours as needed for moderate pain (pain score 4-6) or severe pain (pain score 7-10). 4 tablet 0   SYNTHROID  50 MCG tablet TAKE 1 TABLET BY MOUTH DAILY 90 tablet 3   SYNTHROID  50 MCG tablet TAKE 1 TABLET BY MOUTH DAILY 90 tablet 0   traMADol  (ULTRAM ) 50 MG tablet Take 1 tablet (50 mg total) by mouth every 8 (eight) hours. 10 tablet 0

## 2024-06-16 NOTE — ED Notes (Signed)
 Patient transported to CT

## 2024-06-16 NOTE — Progress Notes (Signed)
 Patient has tele order. Tried two boxes, would not work. Went to get a third box and patient refused to let me hook it up and pulled the leads off.

## 2024-06-16 NOTE — ED Notes (Signed)
 Pt transported to xray

## 2024-06-16 NOTE — Care Management (Signed)
 Transition of Care [facility]) - Inpatient Brief Assessment   Patient Details  Name: Natasha Harper MRN: [id number] Date of Birth: 01/09/1935  [facility] CM/SW Contact:    Corean JAYSON Canary, RN Phone Number: 06/16/2024, 8:48 AM   Clinical Narrative:  89 year old patient presented from home for a fall, right femur fracture.  She normally uses a walker/ cane at home.She lives with spouse. Orthopedics will see her this AM.  Will likely need SNF  post OR. , PT will assessed post op  IPCM will follow for needs, recommendations and transitions of care Transition of Care Asessment: Insurance and Status: Insurance coverage has been reviewed Patient has primary care physician: Yes Home environment has been reviewed: Lives with spouse Prior level of function:: Uses walker/cane Prior/Current Home Services: No current home services Social Drivers of Health Review: SDOH reviewed needs interventions (social isolation) Readmission risk has been reviewed: Yes Transition of care needs: transition of care needs identified, TOC will continue to follow

## 2024-06-16 NOTE — Op Note (Signed)
 Orthopaedic Surgery Operative Note (CSN: [id number] ) Date of Surgery: 06/16/2024  Admit Date: 06/16/2024   Diagnoses: Pre-Op Diagnoses: Right intertrochanteric femur fracture  Post-Op Diagnosis: Same  Procedures: CPT 27245-Cephalomedullary nailing of right intertrochanteric femur fracture  Surgeons : Primary: Kendal Franky SQUIBB, MD  Assistant: Lauraine Moores, PA-C  Location: OR 3   Anesthesia: General   Antibiotics: Ancef  2g preop   Tourniquet time: None    Estimated Blood Loss: 100 mL  Complications:* No complications entered in OR log *   Specimens:* No specimens in log *   Implants: Implant Name Type Inv. Item Serial No. Manufacturer Lot No. LRB No. Used Action  NAIL INTERTAN 10X18 130D 10S - [id number] Nail NAIL INTERTAN 10X18 130D 10S  [employer] [id number] Right 1 Implanted  SCREW LAG COMPR KIT 100/95 - [id number] Screw SCREW LAG COMPR KIT 100/95  [employer] [id number] Right 1 Implanted  SCREW TRIGEN LOW PROF 5.0X30 - [id number] Screw SCREW TRIGEN LOW PROF 5.0X30  [employer] [id number] Right 1 Implanted     Indications for Surgery: 89 year old female who sustained a ground-level fall with a right intertrochanteric femur fracture.  Due to the unstable nature of her injury I recommend proceeding with a cephalomedullary nailing of the right hip.  Risks and benefits were discussed with the patient and her husband.  Risks included but not limited to bleeding, infection, malunion, nonunion, hardware failure, hardware rotation, nerve or blood vessel injury, DVT, even the possibility anesthetic complications.  They agreed to proceed with surgery and consent was obtained.  Operative Findings: 1.  Cephalomedullary nailing of right intertrochanteric femur fracture using [employer] 10 x 180 mm nail with a 100 mm lag screw and a 95 mm compression screw.  Procedure: The patient was identified in the preoperative holding area.  Consent was confirmed with the patient and their family and all questions were answered. The operative extremity was marked after confirmation with the patient. she was then brought back to the operating room by our anesthesia colleagues.  She was placed under general anesthetic and carefully transferred to the [facility] table.  Fluoroscopic imaging showed the unstable nature of her injury.  Traction was applied to the right hip and alignment was maintained.  The right hip was then prepped and draped in usual sterile fashion.  A timeout was performed to verify the patient, the procedure, and the extremity.  Preoperative antibiotics were dosed.  The small incision proximal to the greater trochanter was made and carried down through skin and subcutaneous tissue.  A threaded guidewire was placed at the tip of the greater trochanter and advanced into the proximal metaphysis.  An entry reamer was used to enter the medullary canal.  A 10 x 180 mm nail attached to the targeting arm was placed on the side of the canal.  I then used the targeting arm to direct a threaded guidewire up into the head/neck segment.  I confirmed adequate tip apex distance and measured the length and decided to use 100 mm screw.  I then drilled the path for the compression screw and placed an antirotation bar.  I then drilled the path of the lag screw and placed the 100 mm lag screw.  I then used a compression screw to compress approximately 7 mm.  Excellent fixation was obtained.  The proximal nail was statically locked.  The targeting arm was used to place a lateral to medial distal locking screw.  The targeting arm was  removed and final fluoroscopic imaging was obtained.  The incisions were irrigated and closed with 2-0 Monocryl and Dermabond.  Sterile dressings were applied.  The patient was then awoke from anesthesia and taken to the PACU in stable condition.  Post Op Plan/Instructions: Patient will be weightbearing as tolerated to the right  lower extremity.  She will receive postoperative Ancef .  She will receive Lovenox  for DVT prophylaxis and discharge on oral DOAC.  Will have her mobilize with physical and Occupational Therapy.  I was present and performed the entire surgery.  Lauraine Moores, PA-C did assist me throughout the case. An assistant was necessary given the difficulty in approach, maintenance of reduction and ability to instrument the fracture.   Franky Light, MD [facility]

## 2024-06-16 NOTE — Anesthesia Procedure Notes (Signed)
 Procedure Name: Intubation Date/Time: 06/16/2024 11:59 AM  Performed by: Harrold Macintosh, CRNAPre-anesthesia Checklist: Patient identified, Emergency Drugs available, Suction available and Patient being monitored Patient Re-evaluated:Patient Re-evaluated prior to induction Oxygen  Delivery Method: Circle system utilized Preoxygenation: Pre-oxygenation with 100% oxygen  Induction Type: IV induction Ventilation: Mask ventilation without difficulty Laryngoscope Size: Miller and 2 Grade View: Grade II Tube type: Oral Tube size: 7.0 mm Number of attempts: 1 Airway Equipment and Method: Stylet Placement Confirmation: ETT inserted through vocal cords under direct vision, positive ETCO2 and breath sounds checked- equal and bilateral Secured at: 21 cm Tube secured with: Tape Dental Injury: Teeth and Oropharynx as per pre-operative assessment

## 2024-06-16 NOTE — Anesthesia Postprocedure Evaluation (Signed)
 Anesthesia Post Note  Patient: Natasha Harper  Procedure(s) Performed: FIXATION, FRACTURE, INTERTROCHANTERIC, WITH INTRAMEDULLARY ROD (Right: Hip)     Patient location during evaluation: PACU Anesthesia Type: General Level of consciousness: awake Pain management: pain level controlled Vital Signs Assessment: post-procedure vital signs reviewed and stable Respiratory status: spontaneous breathing, nonlabored ventilation and respiratory function stable Cardiovascular status: blood pressure returned to baseline and stable Postop Assessment: no apparent nausea or vomiting Anesthetic complications: no   No notable events documented.  Last Vitals:  Vitals:   06/16/24 1304 06/16/24 1315  BP: (!) 150/64 (!) 148/83  Pulse: 92 93  Resp: (!) 21 (!) 22  Temp: 36.6 C   SpO2: 96% 94%    Last Pain:  Vitals:   06/16/24 1304  TempSrc:   PainSc: 0-No pain                 Delon Aisha Arch

## 2024-06-17 ENCOUNTER — Encounter (HOSPITAL_COMMUNITY): Payer: Self-pay | Admitting: Student

## 2024-06-17 LAB — BASIC METABOLIC PANEL WITH GFR
Anion gap: 5 (ref 5–15)
BUN: 29 mg/dL — ABNORMAL HIGH (ref 8–23)
CO2: 24 mmol/L (ref 22–32)
Calcium: 8.3 mg/dL — ABNORMAL LOW (ref 8.9–10.3)
Chloride: 105 mmol/L (ref 98–111)
Creatinine, Ser: 0.65 mg/dL (ref 0.44–1.00)
GFR, Estimated: >60 mL/min (ref >60)
Glucose, Bld: 128 mg/dL — ABNORMAL HIGH (ref 70–99)
Potassium: 4.5 mmol/L (ref 3.5–5.1)
Sodium: 134 mmol/L — ABNORMAL LOW (ref 135–145)

## 2024-06-17 LAB — CBC
HCT: 23.5 % — ABNORMAL LOW (ref 36.0–46.0)
Hemoglobin: 8 g/dL — ABNORMAL LOW (ref 12.0–15.0)
MCH: 35.6 pg — ABNORMAL HIGH (ref 26.0–34.0)
MCHC: 34 g/dL (ref 30.0–36.0)
MCV: 104.4 fL — ABNORMAL HIGH (ref 80.0–100.0)
Platelets: 182 K/uL (ref 150–400)
RBC: 2.25 MIL/uL — ABNORMAL LOW (ref 3.87–5.11)
RDW: 13.6 % (ref 11.5–15.5)
WBC: 6.4 K/uL (ref 4.0–10.5)
nRBC: 0 % (ref 0.0–0.2)

## 2024-06-17 LAB — VITAMIN B12: Vitamin B-12: 477 pg/mL (ref 180–914)

## 2024-06-17 MED ORDER — APIXABAN 2.5 MG PO TABS: 2.5000 mg | ORAL_TABLET | Freq: Two times a day (BID) | ORAL | 0 refills | Status: AC

## 2024-06-17 MED ORDER — METHOCARBAMOL 500 MG PO TABS: 500.0000 mg | ORAL_TABLET | Freq: Three times a day (TID) | ORAL | 0 refills | Status: DC | PRN

## 2024-06-17 MED ORDER — ACETAMINOPHEN 325 MG PO TABS: 325.0000 mg | ORAL_TABLET | Freq: Four times a day (QID) | ORAL | Status: DC | PRN

## 2024-06-17 MED ORDER — HYDROCODONE-ACETAMINOPHEN 5-325 MG PO TABS: 1.0000 | ORAL_TABLET | Freq: Four times a day (QID) | ORAL | 0 refills | Status: DC | PRN

## 2024-06-17 NOTE — TOC CAGE-AID Note (Signed)
 Transition of Care [facility]) - CAGE-AID Screening   Patient Details  Name: Natasha Harper MRN: [id number] Date of Birth: 07-25-1934  [facility]) CM/SW Contact:    LEBRON ROCKIE ORN, RN Phone Number: [phone] 06/17/2024, 8:24 PM   Clinical Narrative: Pt denies alcohol or illicit drug use.  Screening complete.   CAGE-AID Screening:    Have You Ever Felt You Ought to Cut Down on Your Drinking or Drug Use?: No Have People Annoyed You By Critizing Your Drinking Or Drug Use?: No Have You Felt Bad Or Guilty About Your Drinking Or Drug Use?: No Have You Ever Had a Drink or Used Drugs First Thing In The Morning to Steady Your Nerves or to Get Rid of a Hangover?: No CAGE-AID Score: 0  Substance Abuse Education Offered: No

## 2024-06-17 NOTE — TOC Initial Note (Signed)
 Transition of Care [facility]) - Initial/Assessment Note    Patient Details  Name: Natasha Harper MRN: [id number] Date of Birth: [birth year]  Transition of Care [facility]) CM/SW Contact:    Gwenn Julien [place], [place] Phone Number: 06/17/2024, 11:14 AM  Clinical Narrative: SW met with pt and pt's spouse at bedside. Pt sleeping at time of visit. Pt from home with spouse. Pt's dtr is supportive and lives in [place]. Reviewed PT/OT recommendation for SNF and placement process. Pt with prior stay at [facility] and pt's husband prefers a different facility if possible. Will begin SNF search and f/u with offers as available.   Julien Gwenn, MSW, LCSW [phone] (coverage)                    Expected Discharge Plan: Skilled Nursing Facility Barriers to Discharge: Continued Medical Work up, English As A Second Language Teacher, SNF Pending bed offer   Patient Goals and CMS Choice   CMS Medicare.gov Compare Post Acute Care list provided to:: Patient Represenative (must comment) (spouse) Choice offered to / list presented to : Spouse [place] ownership interest in [facility].provided to:: Spouse    Expected Discharge Plan and Services     Post Acute Care Choice: Skilled Nursing Facility Living arrangements for the past 2 months: Single Family Home                                      Prior Living Arrangements/Services Living arrangements for the past 2 months: Single Family Home Lives with:: Spouse Patient language and need for interpreter reviewed:: Yes        Need for Family Participation in Patient Care: Yes (Comment) Care giver support system in place?: Yes (comment)   Criminal Activity/Legal Involvement Pertinent to Current Situation/Hospitalization: No - Comment as needed  Activities of Daily Living   ADL Screening (condition at time of admission) Independently performs ADLs?: Yes (appropriate for developmental age) Is the patient deaf or have difficulty hearing?:  Yes Does the patient have difficulty seeing, even when wearing glasses/contacts?: Yes Does the patient have difficulty concentrating, remembering, or making decisions?: No  Permission Sought/Granted Permission sought to share information with : Oceanographer granted to share information with : Yes, Verbal Permission Granted              Emotional Assessment       Orientation: : Oriented to Self, Oriented to Place, Oriented to  Time, Oriented to Situation Alcohol / Substance Use: Not Applicable Psych Involvement: No (comment)  Admission diagnosis:  Femoral fracture (HCC) [S72.90XA] Fall, initial encounter [W19.XXXA] Closed displaced intertrochanteric fracture of right femur, initial encounter (HCC) [S72.141A] Closed fracture of sacrum, unspecified portion of sacrum, initial encounter (HCC) [S32.10XA] Patient Active Problem List   Diagnosis Date Noted   Femoral fracture (HCC) 06/16/2024   Macrocytic anemia 06/16/2024   Acute cystitis without hematuria 10/05/2023   Hypoxia 10/05/2023   Bilateral pubic rami fractures, closed, initial encounter (HCC) 10/05/2023   Fall at home, initial encounter 09/05/2021   Primary hypertension    Thoracic compression fracture (HCC) 07/05/2021   Vertebral fracture, osteoporotic (HCC) 04/15/2021   Anemia 04/14/2021   Compression fracture of thoracic vertebra (HCC) 02/06/2021   Compression of lumbar vertebra (HCC) 02/06/2021   Back pain 02/05/2021   Decreased hearing 10/05/2013   CAD (coronary artery disease)    Hyperlipidemia    Carotid arterial disease  Hypothyroidism    Disorder of bone and cartilage 04/07/2008   HYPERTENSION, WHITE COAT 04/07/2008   HYPERGLYCEMIA, FASTING 07/21/2007   THYROID  NODULE 04/05/2007   PCP:  Charlett Apolinar POUR, MD Pharmacy:   [facility] [address] [place] - [address] [place]  [address] Phone: [phone] Fax:  [phone]  [facility] - [place], [place] - [address] [facility] [place] [address] Phone: [phone] Fax: [phone]     Social Drivers of Health (SDOH) Social History: SDOH Screenings   Food Insecurity: No Food Insecurity (06/16/2024)  Housing: Low Risk (06/16/2024)  Transportation Needs: No Transportation Needs (06/16/2024)  Utilities: Patient Declined (06/16/2024)  Depression (PHQ2-9): Low Risk (01/28/2022)  Social Connections: Socially Isolated (06/16/2024)  Tobacco Use: Medium Risk (06/16/2024)   SDOH Interventions:     Readmission Risk Interventions     No data to display

## 2024-06-17 NOTE — Progress Notes (Signed)
 Orthopaedic Trauma Progress Note  SUBJECTIVE: Patient doing okay this morning.  Overall pain has been well-controlled.  Has not been out of bed yet since surgery.  Attempted to remove her surgical dressings last night due to some confusion.  Nursing reinforced dressings.  Patient denies any numbness or tingling to the right lower extremity.  No chest pain. No SOB. No nausea/vomiting. No other complaints.  No family at bedside currently  OBJECTIVE:  Vitals:   06/17/24 0500 06/17/24 0758  BP: 128/85 (!) 144/67  Pulse: 100 (!) 102  Resp: 18 16  Temp: 98.3 F (36.8 C) 98.1 F (36.7 C)  SpO2: 94% 95%    Opiates Today (MME): Today's  total administered Morphine  Milligram Equivalents: 5 Opiates Yesterday (MME): Yesterday's total administered Morphine  Milligram Equivalents: 42  General: Sitting up in bed, no acute distress Respiratory: No increased work of breathing.  Operative Extremity (right lower extremity): Dressings clean, dry, intact.  Some soreness over the hip and throughout the thigh as expected but otherwise no significant tenderness over the knee or throughout the lower leg.  Ankle DF/PF intact.  No out of proportion with passive stretch of the toes.  Endorses sensation all aspects of the foot.  + DP pulse   IMAGING: Stable post op imaging right hip  LABS:  Results for orders placed or performed during the hospital encounter of 06/16/24 (from the past 24 hours)  Type and screen [facility]     Status: None   Collection Time: 06/16/24 10:38 AM  Result Value Ref Range   ABO/RH(D) A POS    Antibody Screen NEG    Sample Expiration      06/19/2024,2359 Performed at [facility], [address], [place], [place] [address]   VITAMIN D  25 Hydroxy (Vit-D Deficiency, Fractures)     Status: None   Collection Time: 06/16/24  7:30 PM  Result Value Ref Range   Vit D, 25-Hydroxy 63.90 30 - 100 ng/mL  Basic metabolic panel     Status: Abnormal   Collection Time:  06/17/24  4:33 AM  Result Value Ref Range   Sodium 134 (L) 135 - 145 mmol/L   Potassium 4.5 3.5 - 5.1 mmol/L   Chloride 105 98 - 111 mmol/L   CO2 24 22 - 32 mmol/L   Glucose, Bld 128 (H) 70 - 99 mg/dL   BUN 29 (H) 8 - 23 mg/dL   Creatinine, Ser 9.34 0.44 - 1.00 mg/dL   Calcium  8.3 (L) 8.9 - 10.3 mg/dL   GFR, Estimated >39 >39 mL/min   Anion gap 5 5 - 15  CBC     Status: Abnormal   Collection Time: 06/17/24  4:33 AM  Result Value Ref Range   WBC 6.4 4.0 - 10.5 K/uL   RBC 2.25 (L) 3.87 - 5.11 MIL/uL   Hemoglobin 8.0 (L) 12.0 - 15.0 g/dL   HCT 76.4 (L) 63.9 - 53.9 %   MCV 104.4 (H) 80.0 - 100.0 fL   MCH 35.6 (H) 26.0 - 34.0 pg   MCHC 34.0 30.0 - 36.0 g/dL   RDW 86.3 88.4 - 84.4 %   Platelets 182 150 - 400 K/uL   nRBC 0.0 0.0 - 0.2 %  Vitamin B12     Status: None   Collection Time: 06/17/24  4:33 AM  Result Value Ref Range   Vitamin B-12 477 180 - 914 pg/mL    ASSESSMENT: Natasha Harper is a 89 y.o. female, 1 Day  Post-Op s/p ground-level fall Procedures: INTRAMEDULLARY NAILING RIGHT INTERTROCHANTERIC FEMUR FRACTURE  CV/Blood loss: Acute blood loss anemia, Hgb 8.0 this AM. Hemodynamically stable  PLAN: Weightbearing: WBAT RLE ROM: Unrestricted ROM Incisional and dressing care: Change Mepilex dressings as needed Showering: Okay to begin getting incisions wet starting 06/19/2024 Orthopedic device(s): None  Pain management:  1. Tylenol  325-650 mg q 6 hours PRN 2. Robaxin  500 mg q 8 hours PRN 3. Norco 5-325 mg q 6 hours PRN 4. Morphine  0.5 mg q 2 hours PRN VTE prophylaxis: Lovenox , SCDs ID:  Ancef  2gm post op Foley/Lines:  No foley, KVO IVFs Impediments to Fracture Healing: Vitamin D  level 63, no additional supplementation needed Dispo: PT/OT evaluation today, anticipate patient will need SNF at discharge.  Ortho issues stable.  Okay for discharge from ortho standpoint once cleared by medicine team and therapies  D/C recommendations: - Norco, Tylenol  for pain control -  Eliquis 2.5 mg twice daily x 30 days for DVT prophylaxis - No additional need for Vit D supplementation  Follow - up plan: 2 weeks after d/c for wound check and repeat x-rays   Contact information:  Franky Light MD, Lauraine Moores PA-C. After hours and holidays please check [email or web address] for group call information for [facility]   Lauraine PATRIC Moores, PA-C [phone] (office) [email or web address]

## 2024-06-17 NOTE — NC FL2 (Signed)
 [place]  MEDICAID FL2 LEVEL OF CARE FORM     IDENTIFICATION  Patient Name: Natasha Harper Birthdate: 03-Mar-1935 Sex: female Admission Date (Current Location): 06/16/2024  [facility] and [place] Number:  Producer, Television/film/video and Address:  [facility], [address], [place], [place] [address]      Provider Number: [id number]  Attending Physician Name and Address:  Leotis Bogus, MD  Relative Name and Phone Number:       Current Level of Care: Hospital Recommended Level of Care: Skilled Nursing Facility Prior Approval Number:    Date Approved/Denied:   PASRR Number: [id number]  Discharge Plan: SNF    Current Diagnoses: Patient Active Problem List   Diagnosis Date Noted   Femoral fracture (HCC) 06/16/2024   Macrocytic anemia 06/16/2024   Acute cystitis without hematuria 10/05/2023   Hypoxia 10/05/2023   Bilateral pubic rami fractures, closed, initial encounter (HCC) 10/05/2023   Fall at home, initial encounter 09/05/2021   Primary hypertension    Thoracic compression fracture (HCC) 07/05/2021   Vertebral fracture, osteoporotic (HCC) 04/15/2021   Anemia 04/14/2021   Compression fracture of thoracic vertebra (HCC) 02/06/2021   Compression of lumbar vertebra (HCC) 02/06/2021   Back pain 02/05/2021   Decreased hearing 10/05/2013   CAD (coronary artery disease)    Hyperlipidemia    Carotid arterial disease    Hypothyroidism    Disorder of bone and cartilage 04/07/2008   HYPERTENSION, WHITE COAT 04/07/2008   HYPERGLYCEMIA, FASTING 07/21/2007   THYROID  NODULE 04/05/2007    Orientation RESPIRATION BLADDER Height & Weight     Self, Time, Situation, Place  Normal External catheter Weight: 120 lb (54.4 kg) Height:  5' 2 (157.5 cm)  BEHAVIORAL SYMPTOMS/MOOD NEUROLOGICAL BOWEL NUTRITION STATUS      Continent    AMBULATORY STATUS COMMUNICATION OF NEEDS Skin   Extensive Assist Verbally Surgical wounds                       Personal  Care Assistance Level of Assistance  Bathing, Dressing Bathing Assistance: Maximum assistance   Dressing Assistance: Maximum assistance     Functional Limitations Info  Sight, Hearing, Speech Sight Info: Adequate Hearing Info: Adequate Speech Info: Adequate    SPECIAL CARE FACTORS FREQUENCY  PT (By licensed PT), OT (By licensed OT)                    Contractures Contractures Info: Not present    Additional Factors Info  Code Status Code Status Info: FULL CODE             Current Medications (06/17/2024):  This is the current hospital active medication list Current Facility-Administered Medications  Medication Dose Route Frequency Provider Last Rate Last Admin   0.9 %  sodium chloride  infusion   Intravenous Continuous Danton Lauraine LABOR, PA-C 40 mL/hr at 06/16/24 1716 New Bag at 06/16/24 1716   acetaminophen  (TYLENOL ) tablet 325-650 mg  325-650 mg Oral Q6H PRN McClung, Sarah A, PA-C       aspirin  EC tablet 81 mg  81 mg Oral Daily Smith, Rondell A, MD   81 mg at 06/17/24 9193   atorvastatin  (LIPITOR ) tablet 80 mg  80 mg Oral Daily Smith, Rondell A, MD   80 mg at 06/17/24 9193   ceFAZolin  (ANCEF ) IVPB 2g/100 mL premix  2 g Intravenous Q8H Danton Lauraine A, PA-C 200 mL/hr at 06/17/24 0458 2 g at 06/17/24 0458   diphenhydrAMINE (  BENADRYL) 12.5 MG/5ML elixir 12.5-25 mg  12.5-25 mg Oral Q4H PRN Danton Lauraine LABOR, PA-C       docusate sodium  (COLACE) capsule 100 mg  100 mg Oral BID Danton Lauraine LABOR, PA-C   100 mg at 06/17/24 9193   enoxaparin  (LOVENOX ) injection 40 mg  40 mg Subcutaneous Q24H Danton Lauraine LABOR, PA-C   40 mg at 06/17/24 0805   HYDROcodone -acetaminophen  (NORCO/VICODIN) 5-325 MG per tablet 1-2 tablet  1-2 tablet Oral Q6H PRN Danton Lauraine LABOR, PA-C   1 tablet at 06/17/24 9193   Influenza vac split trivalent PF (FLUZONE HIGH-DOSE) injection 0.5 mL  0.5 mL Intramuscular Tomorrow-1000 Claudene Reeves A, MD       levothyroxine  (SYNTHROID ) tablet 50 mcg  50 mcg Oral Q0600  Smith, Rondell A, MD   50 mcg at 06/17/24 0458   methocarbamol  (ROBAXIN ) tablet 500 mg  500 mg Oral Q8H PRN Danton Lauraine LABOR, PA-C       Or   methocarbamol  (ROBAXIN ) injection 500 mg  500 mg Intravenous Q8H PRN Danton Lauraine LABOR, PA-C       metoCLOPramide  (REGLAN ) tablet 5-10 mg  5-10 mg Oral Q8H PRN Danton, Sarah A, PA-C       Or   metoCLOPramide  (REGLAN ) injection 5-10 mg  5-10 mg Intravenous Q8H PRN Danton Lauraine LABOR, PA-C       morphine  (PF) 2 MG/ML injection 0.5 mg  0.5 mg Intravenous Q2H PRN Danton Lauraine LABOR, PA-C       ondansetron  (ZOFRAN ) tablet 4 mg  4 mg Oral Q6H PRN Danton, Sarah A, PA-C       Or   ondansetron  (ZOFRAN ) injection 4 mg  4 mg Intravenous Q6H PRN McClung, Sarah A, PA-C       polyethylene glycol (MIRALAX  / GLYCOLAX ) packet 17 g  17 g Oral Daily PRN Danton Lauraine LABOR, PA-C         Discharge Medications: Please see discharge summary for a list of discharge medications.  Relevant Imaging Results:  Relevant Lab Results:   Additional Information SS# [id number]  Natasha Harper [place], [place]

## 2024-06-17 NOTE — Evaluation (Signed)
 Occupational Therapy Evaluation Patient Details Name: Natasha Harper MRN: [id number] DOB: 11-30-1934 Today's Date: 06/17/2024   History of Present Illness   Pt is a 89 y.o. F who presents with a fall with intertrochanteric proximal R femoral and sacral fracture s/p cephalomedullary nailing. Significant PMH: CAD, L hip fracture 2023, HTN, osteoporosis.     Clinical Impressions PTA, pt lives with spouse and typically Modified Independent with ADLs and mobility using cane vs RW. Pt presents now with deficits in RLE pain, endurance, cognition and strength. Pt premedicated prior to session. Pt required Max A x2 for bed mobility, attempted Stedy but unsuccessfully likely primarily d/t pt anxiety with mobility. Pt requires Min A for UB ADL and up to Total A for LB ADLs d/t deficits. Patient will benefit from continued inpatient follow up therapy, <3 hours/day at [facility]     If plan is discharge home, recommend the following:   A lot of help with walking and/or transfers;Two people to help with walking and/or transfers;A lot of help with bathing/dressing/bathroom;Two people to help with bathing/dressing/bathroom     Functional Status Assessment   Patient has had a recent decline in their functional status and demonstrates the ability to make significant improvements in function in a reasonable and predictable amount of time.     Equipment Recommendations   Other (comment) (TBD pending progress)     Recommendations for Other Services         Precautions/Restrictions   Precautions Precautions: Fall Restrictions Weight Bearing Restrictions Per Provider Order: Yes Other Position/Activity Restrictions: RLE WBAT     Mobility Bed Mobility Overal bed mobility: Needs Assistance Bed Mobility: Supine to Sit, Sit to Supine     Supine to sit: Max assist, +2 for physical assistance Sit to supine: Max assist, +2 for physical assistance   General bed mobility comments: Decreased  initiation, step by step cueing, assist for BLE management and trunk to sit up on edge of bed with use of bed pad.    Transfers Overall transfer level: Needs assistance                 General transfer comment: Attempted Stedy; pt able to clear hips but not stand upright. feel anxiety limiting ability to stand      Balance Overall balance assessment: Needs assistance Sitting-balance support: Feet supported Sitting balance-[name]: Poor Sitting balance - Comments: Requires BUE support                                   ADL either performed or assessed with clinical judgement   ADL Overall ADL's : Needs assistance/impaired Eating/Feeding: Independent   Grooming: Set up;Sitting   Upper Body Bathing: Minimal assistance;Sitting   Lower Body Bathing: Maximal assistance;Sitting/lateral leans;Bed level   Upper Body Dressing : Minimal assistance;Sitting   Lower Body Dressing: Total assistance;Sitting/lateral leans;Bed level       Toileting- Clothing Manipulation and Hygiene: Total assistance;Bed level               Vision Ability to See in Adequate Light: 0 Adequate Patient Visual Report: No change from baseline Vision Assessment?: No apparent visual deficits     Perception         Praxis         Pertinent Vitals/Pain Pain Assessment Pain Assessment: Faces Faces Pain Scale: Hurts whole lot Pain Location: R hip Pain Descriptors / Indicators: Operative site guarding, Moaning, Grimacing,  Sharp Pain Intervention(s): Monitored during session, Premedicated before session     Extremity/Trunk Assessment Upper Extremity Assessment Upper Extremity Assessment: Generalized weakness;Right hand dominant   Lower Extremity Assessment Lower Extremity Assessment: Defer to PT evaluation RLE Deficits / Details: Grossly 3/5 ankle DF/PF   Cervical / Trunk Assessment Cervical / Trunk Assessment: Kyphotic   Communication Communication Communication:  Impaired Factors Affecting Communication: Hearing impaired   Cognition Arousal: Alert Behavior During Therapy: Anxious, WFL for tasks assessed/performed Cognition: Cognition impaired     Awareness: Online awareness impaired, Intellectual awareness impaired Memory impairment (select all impairments): Short-term memory, Working civil service fast streamer, Engineer, structural memory Attention impairment (select first level of impairment): Sustained attention Executive functioning impairment (select all impairments): Initiation, Organization, Sequencing, Problem solving, Reasoning OT - Cognition Comments: confusion noted w/ inconsistent reports of being [facility], requesting staff speak up and then intermittently saying staff talking too loud and she could hear them? pt anxious with mobility, self limiting in abilities                 Following commands: Impaired Following commands impaired: Follows one step commands inconsistently, Follows one step commands with increased time     Cueing  General Comments   Cueing Techniques: Verbal cues  Husband present initially   Exercises     Shoulder Instructions      Home Living Family/patient expects to be discharged to:: Private residence Living Arrangements: Spouse/significant other Available Help at Discharge: Family;Available 24 hours/day Type of Home: House Home Access: Stairs to enter [employer] of Steps: 2 Entrance Stairs-Rails: Right;Left Home Layout: Two level;Able to live on main level with bedroom/bathroom     Bathroom Shower/Tub: Tub/shower unit;Walk-in shower;Sponge bathes at baseline   Bathroom Toilet: Standard     Home Equipment: Agricultural Consultant (2 wheels);Shower seat - built in;Grab bars - toilet;Grab bars - tub/shower;Transport chair;Other (comment) (hurrycane)          Prior Functioning/Environment Prior Level of Function : Needs assist;History of Falls (last six months)             Mobility Comments: RW vs  cane for mobility. only this one fall in past 3 months ADLs Comments: sponge bathing with wipes, able to dress and toilet self. husband does IADLs    OT Problem List: Decreased strength;Decreased activity tolerance;Impaired balance (sitting and/or standing);Decreased cognition;Decreased safety awareness;Decreased knowledge of use of DME or AE;Decreased knowledge of precautions;Pain   OT Treatment/Interventions: Self-care/ADL training;Therapeutic exercise;Energy conservation;DME and/or AE instruction;Therapeutic activities;Patient/family education;Balance training      OT Goals(Current goals can be found in the care plan section)   Acute Rehab OT Goals Patient Stated Goal: agreeable for rehab (not [facility]) OT Goal Formulation: With patient/family Time For Goal Achievement: 07/01/24 Potential to Achieve Goals: Good ADL Goals Pt Will Perform Lower Body Bathing: with mod assist;sit to/from stand;sitting/lateral leans Pt Will Perform Lower Body Dressing: with mod assist;sit to/from stand;sitting/lateral leans Pt Will Transfer to Toilet: with min assist;stand pivot transfer;bedside commode Additional ADL Goal #1: Pt to complete bed mobility with Mod A in prep for EOB/OOB ADLs   OT Frequency:  Min 2X/week    Co-evaluation PT/OT/SLP Co-Evaluation/Treatment: Yes Reason for Co-Treatment: For patient/therapist safety;To address functional/ADL transfers PT goals addressed during session: Mobility/safety with mobility OT goals addressed during session: ADL's and self-care;Strengthening/ROM      AM-PAC OT 6 Clicks Daily Activity     Outcome Measure Help from another person eating meals?: None Help from another person taking care of personal grooming?: A Little  Help from another person toileting, which includes using toliet, bedpan, or urinal?: Total Help from another person bathing (including washing, rinsing, drying)?: A Lot Help from another person to put on and taking off regular  upper body clothing?: A Little Help from another person to put on and taking off regular lower body clothing?: Total 6 Click Score: 14   End of Session Equipment Utilized During Treatment: Gait belt Nurse Communication: Mobility status;Need for lift equipment  Activity Tolerance: Patient limited by pain;Other (comment) (limited by cognition) Patient left: in bed;with call bell/phone within reach;with bed alarm set;with family/visitor present  OT Visit Diagnosis: Unsteadiness on feet (R26.81);Other abnormalities of gait and mobility (R26.89);Muscle weakness (generalized) (M62.81)                Time: 9146-9077 OT Time Calculation (min): 29 min Charges:  OT General Charges $OT Visit: 1 Visit OT Evaluation $OT Eval Moderate Complexity: 1 Mod  Mliss NOVAK, OTR/L Acute Rehab Services Office: [phone]   Mliss Fish 06/17/2024, 10:26 AM

## 2024-06-17 NOTE — Plan of Care (Signed)
°  Problem: Education: Goal: Knowledge of General Education information will improve Description: Including pain rating scale, medication(s)/side effects and non-pharmacologic comfort measures Outcome: Progressing   Problem: Clinical Measurements: Goal: Will remain free from infection Outcome: Progressing Goal: Diagnostic test results will improve Outcome: Progressing Goal: Respiratory complications will improve Outcome: Progressing   Problem: Activity: Goal: Risk for activity intolerance will decrease Outcome: Progressing   Problem: Coping: Goal: Level of anxiety will decrease Outcome: Progressing   Problem: Elimination: Goal: Will not experience complications related to bowel motility Outcome: Progressing Goal: Will not experience complications related to urinary retention Outcome: Progressing   Problem: Pain Managment: Goal: General experience of comfort will improve and/or be controlled Outcome: Progressing   Problem: Safety: Goal: Ability to remain free from injury will improve Outcome: Progressing   Problem: Skin Integrity: Goal: Risk for impaired skin integrity will decrease Outcome: Progressing

## 2024-06-17 NOTE — Evaluation (Signed)
 Physical Therapy Evaluation Patient Details Name: Natasha Harper MRN: [id number] DOB: 02-18-1935 Today's Date: 06/17/2024  History of Present Illness  Pt is a 89 y.o. F who presents with a fall with intertrochanteric proximal R femoral and sacral fracture s/p cephalomedullary nailing. Significant PMH: CAD, L hip fracture 2023, HTN, osteoporosis.  Clinical Impression  Pt admitted after a fall with right intertrochanteric femur fracture s/p cephalomedullary nailing. Pt premedicated prior to session. Requiring +2 assist to transition to edge of bed. After visual demonstration, attempted to utilize [place] (lift equipment) to transfer to the chair. Pt able to clear hips from edge of bed, but unable to stand upright, ultimately limited by pain and feeling anxious. Pt requesting to lie back down. Think pt has capability to transfer, but pain is biggest barrier currently to mobilization. Suspect this will improve will time. Patient will benefit from continued inpatient follow up therapy, <3 hours/day to address deficits, maximize functional mobility and decrease caregiver burden.      If plan is discharge home, recommend the following: Two people to help with walking and/or transfers;Two people to help with bathing/dressing/bathroom   Can travel by private vehicle   No    Equipment Recommendations Other (comment) (defer)  Recommendations for Other Services       Functional Status Assessment Patient has had a recent decline in their functional status and demonstrates the ability to make significant improvements in function in a reasonable and predictable amount of time.     Precautions / Restrictions Precautions Precautions: Fall Restrictions Weight Bearing Restrictions Per Provider Order: No      Mobility  Bed Mobility Overal bed mobility: Needs Assistance Bed Mobility: Supine to Sit, Sit to Supine     Supine to sit: Max assist, +2 for physical assistance Sit to supine: Max assist,  +2 for physical assistance   General bed mobility comments: Decreased initiation, step by step cueing, assist for BLE management and trunk to sit up on edge of bed with use of bed pad.    Transfers Overall transfer level: Needs assistance                 General transfer comment: Attempted Stedy; pt able to clear hips but not stand upright    Ambulation/Gait                  Stairs            Wheelchair Mobility     Tilt Bed    Modified Rankin (Stroke Patients Only)       Balance Overall balance assessment: Needs assistance Sitting-balance support: Feet supported Sitting balance-[name]: Poor Sitting balance - Comments: Requires BUE support                                     Pertinent Vitals/Pain Pain Assessment Pain Assessment: Faces Faces Pain Scale: Hurts whole lot Pain Location: R hip Pain Descriptors / Indicators: Operative site guarding, Moaning, Grimacing, Sharp Pain Intervention(s): Limited activity within patient's tolerance, Monitored during session, Premedicated before session, Repositioned    Home Living Family/patient expects to be discharged to:: Private residence Living Arrangements: Spouse/significant other Available Help at Discharge: Family;Available 24 hours/day Type of Home: House Home Access: Stairs to enter Entrance Stairs-Rails: Doctor, General Practice of Steps: 2   Home Layout: Two level;Able to live on main level with bedroom/bathroom Home Equipment: Rolling Walker (2 wheels);Shower seat - built  in;Grab bars - toilet;Grab bars - tub/shower;Transport chair;Other (comment) (hurrycane)      Prior Function Prior Level of Function : Needs assist;History of Falls (last six months)             Mobility Comments: RW vs cane for mobility. only this one fall in past 3 months ADLs Comments: sponge bathing with wipes, able to dress and toilet self.     Extremity/Trunk Assessment   Upper  Extremity Assessment Upper Extremity Assessment: Defer to OT evaluation    Lower Extremity Assessment Lower Extremity Assessment: RLE deficits/detail RLE Deficits / Details: Grossly 3/5 ankle DF/PF    Cervical / Trunk Assessment Cervical / Trunk Assessment: Kyphotic  Communication   Communication Communication: Impaired Factors Affecting Communication: Hearing impaired    Cognition   Behavior During Therapy: WFL for tasks assessed/performed   PT - Cognitive impairments: Initiation, Sequencing, Safety/Judgement, Awareness                         Following commands: Impaired Following commands impaired: Follows one step commands inconsistently, Follows one step commands with increased time     Cueing Cueing Techniques: Verbal cues     General Comments      Exercises     Assessment/Plan    PT Assessment Patient needs continued PT services  PT Problem List Decreased strength;Decreased activity tolerance;Decreased balance;Decreased mobility;Decreased cognition;Decreased safety awareness;Pain       PT Treatment Interventions DME instruction;Gait training;Functional mobility training;Therapeutic activities;Therapeutic exercise;Balance training;Patient/family education    PT Goals (Current goals can be found in the Care Plan section)  Acute Rehab PT Goals Patient Stated Goal: agreeable to short term rehab, less pain PT Goal Formulation: With patient/family Time For Goal Achievement: 07/01/24 Potential to Achieve Goals: Fair    Frequency Min 2X/week     Co-evaluation PT/OT/SLP Co-Evaluation/Treatment: Yes Reason for Co-Treatment: For patient/therapist safety;To address functional/ADL transfers PT goals addressed during session: Mobility/safety with mobility         AM-PAC PT 6 Clicks Mobility  Outcome Measure Help needed turning from your back to your side while in a flat bed without using bedrails?: A Lot Help needed moving from lying on your back  to sitting on the side of a flat bed without using bedrails?: Total Help needed moving to and from a bed to a chair (including a wheelchair)?: Total Help needed standing up from a chair using your arms (e.g., wheelchair or bedside chair)?: Total Help needed to walk in hospital room?: Total Help needed climbing 3-5 steps with a railing? : Total 6 Click Score: 7    End of Session Equipment Utilized During Treatment: Gait belt Activity Tolerance: Patient limited by pain Patient left: in bed;with call bell/phone within reach;with bed alarm set Nurse Communication: Mobility status PT Visit Diagnosis: Pain;Other abnormalities of gait and mobility (R26.89) Pain - Right/Left: Right Pain - part of body: Hip    Time: 9146-9077 PT Time Calculation (min) (ACUTE ONLY): 29 min   Charges:   PT Evaluation $PT Eval Low Complexity: 1 Low   PT General Charges $$ ACUTE PT VISIT: 1 Visit         Aleck Daring, PT, DPT Acute Rehabilitation Services Office [phone]   Aleck ONEIDA Daring 06/17/2024, 9:53 AM

## 2024-06-17 NOTE — Discharge Instructions (Addendum)
 [facility] Discharge Instructions   General Discharge Instructions  WEIGHT BEARING STATUS: Weightbearing as tolerated  RANGE OF MOTION/ACTIVITY: Unrestricted ROM  Wound Care: You may remove your surgical dressing on postoperative day #3 (06/19/2024). Incisions can be left open to air if there is no drainage. Once the incision is completely dry and without drainage, it may be left open to air out.  Showering may begin postoperative day #4 (12/15).  Clean incision gently with soap and water.  DVT/PE prophylaxis: Eliquis 2.5 mg twice daily x 30 days  Diet: as you were eating previously.  Can use over the counter stool softeners and bowel preparations, such as Miralax , to help with bowel movements.  Narcotics can be constipating.  Be sure to drink plenty of fluids  PAIN MEDICATION USE AND EXPECTATIONS  You have likely been given narcotic medications to help control your pain.  After a traumatic event that results in an fracture (broken bone) with or without surgery, it is ok to use narcotic pain medications to help control one's pain.  We understand that everyone responds to pain differently and each individual patient will be evaluated on a regular basis for the continued need for narcotic medications. Ideally, narcotic medication use should last no more than 6-8 weeks (coinciding with fracture healing).   As a patient it is your responsibility as well to monitor narcotic medication use and report the amount and frequency you use these medications when you come to your office visit.   We would also advise that if you are using narcotic medications, you should take a dose prior to therapy to maximize you participation.  IF YOU ARE ON NARCOTIC MEDICATIONS IT IS NOT PERMISSIBLE TO OPERATE A MOTOR VEHICLE (MOTORCYCLE/CAR/TRUCK/MOPED) OR HEAVY MACHINERY DO NOT MIX NARCOTICS WITH OTHER CNS (CENTRAL NERVOUS SYSTEM) DEPRESSANTS SUCH AS ALCOHOL  POST-OPERATIVE OPIOID TAPER  INSTRUCTIONS: It is important to wean off of your opioid medication as soon as possible. If you do not need pain medication after your surgery it is ok to stop day one. Opioids include: Codeine, Hydrocodone (Norco, Vicodin), Oxycodone (Percocet, oxycontin ) and hydromorphone  amongst others.  Long term and even short term use of opiods can cause: Increased pain response Dependence Constipation Depression Respiratory depression And more.  Withdrawal symptoms can include Flu like symptoms Nausea, vomiting And more Techniques to manage these symptoms Hydrate well Eat regular healthy meals Stay active Use relaxation techniques(deep breathing, meditating, yoga) Do Not substitute Alcohol to help with tapering If you have been on opioids for less than two weeks and do not have pain than it is ok to stop all together.  Plan to wean off of opioids This plan should start within one week post op of your fracture surgery  Maintain the same interval or time between taking each dose and first decrease the dose.  Cut the total daily intake of opioids by one tablet each day Next start to increase the time between doses. The last dose that should be eliminated is the evening dose.    STOP SMOKING OR USING NICOTINE PRODUCTS!!!!  As discussed nicotine severely impairs your body's ability to heal surgical and traumatic wounds but also impairs bone healing.  Wounds and bone heal by forming microscopic blood vessels (angiogenesis) and nicotine is a vasoconstrictor (essentially, shrinks blood vessels).  Therefore, if vasoconstriction occurs to these microscopic blood vessels they essentially disappear and are unable to deliver necessary nutrients to the healing tissue.  This is one modifiable factor that you can do to  dramatically increase your chances of healing your injury.  (This means no smoking, no nicotine gum, patches, etc)  DO NOT USE NONSTEROIDAL ANTI-INFLAMMATORY DRUGS (NSAID'S)  Using products such  as Advil  (ibuprofen ), Aleve (naproxen), Motrin  (ibuprofen ) for additional pain control during fracture healing can delay and/or prevent the healing response.  If you would like to take over the counter (OTC) medication, Tylenol  (acetaminophen ) is ok.  However, some narcotic medications that are given for pain control contain acetaminophen  as well. Therefore, you should not exceed more than 4000 mg of tylenol  in a day if you do not have liver disease.  Also note that there are may OTC medicines, such as cold medicines and allergy medicines that my contain tylenol  as well.  If you have any questions about medications and/or interactions please ask your doctor/PA or your pharmacist.      ICE AND ELEVATE INJURED/OPERATIVE EXTREMITY  Using ice and elevating the injured extremity above your heart can help with swelling and pain control.  Icing in a pulsatile fashion, such as 20 minutes on and 20 minutes off, can be followed.    Do not place ice directly on skin. Make sure there is a barrier between to skin and the ice pack.    Using frozen items such as frozen peas works well as the conform nicely to the are that needs to be iced.  USE AN ACE WRAP OR TED HOSE FOR SWELLING CONTROL  In addition to icing and elevation, Ace wraps or TED hose are used to help limit and resolve swelling.  It is recommended to use Ace wraps or TED hose until you are informed to stop.    When using Ace Wraps start the wrapping distally (farthest away from the body) and wrap proximally (closer to the body)   Example: If you had surgery on your leg or thing and you do not have a splint on, start the ace wrap at the toes and work your way up to the thigh        If you had surgery on your upper extremity and do not have a splint on, start the ace wrap at your fingers and work your way up to the upper arm   CALL THE OFFICE FOR MEDICATION REFILLS OR WITH ANY QUESTIONS/CONCERNS: [phone]   VISIT OUR WEBSITE FOR ADDITIONAL  INFORMATION: [email or web address]   Discharge Wound Care Instructions  Do NOT apply any ointments, solutions or lotions to pin sites or surgical wounds.  These prevent needed drainage and even though solutions like hydrogen peroxide kill bacteria, they also damage cells lining the pin sites that help fight infection.  Applying lotions or ointments can keep the wounds moist and can cause them to breakdown and open up as well. This can increase the risk for infection. When in doubt call the office.  Surgical incisions should be dressed daily.  If any drainage is noted, use Mepilex dressings  Once the incision is completely dry and without drainage, it may be left open to air out.  Showering may begin 36-48 hours later.  Cleaning gently with soap and water.  Call office for the following: Temperature greater than 101F Persistent nausea and vomiting Severe uncontrolled pain Redness, tenderness, or signs of infection (pain, swelling, redness, odor or green/yellow discharge around the site) Difficulty breathing, headache or visual disturbances Hives Persistent dizziness or light-headedness Extreme fatigue Any other questions or concerns you may have after discharge  In an emergency, call 911 or go to an Emergency Department  at a nearby hospital  OTHER HELPFUL INFORMATION  If you had a block, it will wear off between 8-24 hrs postop typically.  This is period when your pain may go from nearly zero to the pain you would have had postop without the block.  This is an abrupt transition but nothing dangerous is happening.  You may take an extra dose of narcotic when this happens.  You should wean off your narcotic medicines as soon as you are able.  Most patients will be off or using minimal narcotics before their first postop appointment.   We suggest you use the pain medication the first night prior to going to bed, in order to ease any pain when the anesthesia wears off. You should avoid taking  pain medications on an empty stomach as it will make you nauseous.  Do not drink alcoholic beverages or take illicit drugs when taking pain medications.  In most states it is against the law to drive while you are in a splint or sling.  And certainly against the law to drive while taking narcotics.  You may return to work/school in the next couple of days when you feel up to it.   Pain medication may make you constipated.  Below are a few solutions to try in this order: Decrease the amount of pain medication if you arent having pain. Drink lots of decaffeinated fluids. Drink prune juice and/or each dried prunes  If the first 3 dont work start with additional solutions Take Colace - an over-the-counter stool softener Take Senokot - an over-the-counter laxative Take Miralax  - a stronger over-the-counter laxative

## 2024-06-17 NOTE — Progress Notes (Signed)
 PROGRESS NOTE    Natasha Harper  FMW:[id number] DOB: [birth year] DOA: 06/16/2024 PCP: Charlett Apolinar POUR, MD  Brief Narrative:  This 89 yrs old female with PMH significant for hypertension, hyperlipidemia, CAD, hypothyroidism, left hip fracture s/p ORIF presents with a fall resulting in fractures of the left hip and tailbone.  X-ray imaging revealed acute impacted intertrochanteric proximal right femoral fracture, suspected nondisplaced fracture of the S4 and S5 junction, and healed chronic fracture deformity of the bilateral pubic rami.  Dr. Kendal of orthopedics was consulted and recommended medical admission. Patient is status post ORIF.  POD #1.  PT/OT recommended SNF.  Assessment & Plan:   Principal Problem:   Femoral fracture (HCC) Active Problems:   Fall at home, initial encounter   CAD (coronary artery disease)   Hyperlipidemia   Macrocytic anemia   Hypothyroidism   Decreased hearing  Intertrochanteric proximal right femoral and sacral fracture secondary to fall: Patient presented status post mechanical fall at home with right hip pain. Reportedly lost her balance.  Denied any loss of consciousness.  X-rays revealed acute intertrochanter right femur fracture and suspected nondisplaced sacral fracture.  Orthopedics was consulted.  She underwent ORIF.  POD #1. Continue Hydrocodone /morphine  IV as needed for mild to moderate pain respectively PT and OT recommended SNF. TOC is working on finding placement.   CAD: Hyperlipidemia: Continue aspirin  and atorvastatin .   Macrocytic anemia Hemoglobin 10.9 with MCV 108.4 and MCH 35.3. Check vitamin B12 level as this could put patient at increased risk for falls Continue to monitor H&H   Hypothyroidism: Continue levothyroxine    Hard of hearing: Continue supportive care.  DVT prophylaxis: Lovenox  Code Status: Full code Family Communication: Spouse at bedside. Disposition Plan:   Status is: Inpatient Remains inpatient  appropriate because: Admitted status post mechanical fall with right femur fracture status post ORIF.  POD #1.  PT and OT recommended SNF.    Consultants:  Orthopedics  Procedures:ORIF POD # 1  Antimicrobials:  Anti-infectives (From admission, onward)    Start     Dose/Rate Route Frequency Ordered Stop   06/16/24 2000  ceFAZolin  (ANCEF ) IVPB 2g/100 mL premix        2 g 200 mL/hr over 30 Minutes Intravenous Every 8 hours 06/16/24 1551 06/17/24 1150   06/16/24 1057  ceFAZolin  (ANCEF ) 2-4 GM/100ML-% IVPB       Note to Pharmacy: Edsel Leita HERO: cabinet override      06/16/24 1057 06/16/24 2050      Subjective: Patient was seen and examined at bedside.  Overnight events noted. Patient reports feeling better,  She reports having RLE pain but reasonably controlled.   Status post ORIF. POD  # 1.  Objective: Vitals:   06/16/24 1552 06/16/24 2048 06/17/24 0500 06/17/24 0758  BP: (!) 149/84 126/82 128/85 (!) 144/67  Pulse: (!) 109 (!) 101 100 (!) 102  Resp: 17 16 18 16   Temp: 97.9 F (36.6 C) 98 F (36.7 C) 98.3 F (36.8 C) 98.1 F (36.7 C)  TempSrc:  Oral Oral   SpO2: 92% 93% 94% 95%  Weight:      Height:        Intake/Output Summary (Last 24 hours) at 06/17/2024 1216 Last data filed at 06/17/2024 1154 Gross per 24 hour  Intake 1015.93 ml  Output 260 ml  Net 755.93 ml   Filed Weights   06/16/24 0253 06/16/24 1112  Weight: 54.4 kg 54.4 kg    Examination:  General exam: Appears calm and comfortable.  Not in any acute distress. Respiratory system: CTA Bilaterally . Respiratory effort normal. RR 17 Cardiovascular system: S1 & S2 heard, RRR. No JVD, murmurs, rubs, gallops or clicks.  Gastrointestinal system: Abdomen is non distended, soft and non tender. Normal bowel sounds heard. Central nervous system: Alert and oriented x 3. No focal neurological deficits. Extremities: status post ORIF POD #1. Skin: No rashes, lesions or ulcers Psychiatry: Judgement and insight  appear normal. Mood & affect appropriate.     Data Reviewed: I have personally reviewed following labs and imaging studies  CBC: Recent Labs  Lab 06/16/24 0502 06/17/24 0433  WBC 7.6 6.4  NEUTROABS 6.7  --   HGB 10.9* 8.0*  HCT 33.5* 23.5*  MCV 108.4* 104.4*  PLT 246 182   Basic Metabolic Panel: Recent Labs  Lab 06/16/24 0502 06/17/24 0433  NA 136 134*  K 4.1 4.5  CL 103 105  CO2 24 24  GLUCOSE 126* 128*  BUN 18 29*  CREATININE 0.42* 0.65  CALCIUM  8.5* 8.3*   GFR: Estimated Creatinine Clearance: 39.2 mL/min (by C-G formula based on SCr of 0.65 mg/dL). Liver Function Tests: No results for input(s): AST, ALT, ALKPHOS, BILITOT, PROT, ALBUMIN in the last 168 hours. No results for input(s): LIPASE, AMYLASE in the last 168 hours. No results for input(s): AMMONIA in the last 168 hours. Coagulation Profile: No results for input(s): INR, PROTIME in the last 168 hours. Cardiac Enzymes: No results for input(s): CKTOTAL, CKMB, CKMBINDEX, TROPONINI in the last 168 hours. BNP (last 3 results) No results for input(s): PROBNP in the last 8760 hours. HbA1C: No results for input(s): HGBA1C in the last 72 hours. CBG: No results for input(s): GLUCAP in the last 168 hours. Lipid Profile: No results for input(s): CHOL, HDL, LDLCALC, TRIG, CHOLHDL, LDLDIRECT in the last 72 hours. Thyroid  Function Tests: No results for input(s): TSH, T4TOTAL, FREET4, T3FREE, THYROIDAB in the last 72 hours. Anemia Panel: Recent Labs    06/17/24 0433  VITAMINB12 477   Sepsis Labs: No results for input(s): PROCALCITON, LATICACIDVEN in the last 168 hours.  No results found for this or any previous visit (from the past 240 hours).   Radiology Studies: DG HIP PORT UNILAT W OR W/O PELVIS 1V RIGHT Result Date: 06/16/2024 EXAM: 1 VIEW(S) XRAY OF THE RIGHT HIP 06/16/2024 01:52:00 PM COMPARISON: Comparison 09/10/2021. CLINICAL HISTORY: 03948  Fracture [id number] Fracture [id number]. FINDINGS: BONES AND JOINTS: Interval surgical internal fixation of proximal right femoral intracranial fracture. No malalignment. SOFT TISSUES: Expected postoperative changes seen in the surrounding soft tissues. IMPRESSION: 1. Interval surgical internal fixation of a proximal right femoral fracture with expected postoperative changes in the surrounding soft tissues. Electronically signed by: Lynwood Seip MD 06/16/2024 06:11 PM EST RP Workstation: [id number]   DG FEMUR, MIN 2 VIEWS RIGHT Result Date: 06/16/2024 EXAM: 2 VIEW(S) XRAY OF THE RIGHT FEMUR 06/16/2024 12:52:00 PM COMPARISON: Same day radiation exposure index 7.71 mGy. 6 intraoperative fluoroscopic images of the right hip, which demonstrated surgical interim fixation of a proximal right femoral intertrochanteric fracture. CLINICAL HISTORY: 461500 Elective surgery 461500 Elective surgery 461500 FINDINGS: BONES AND JOINTS: Surgical interim fixation of a proximal right femoral intertrochanteric fracture is demonstrated. SOFT TISSUES: The soft tissues are unremarkable. IMPRESSION: 1. Surgical internal fixation of proximal right femoral intertrochanteric fracture. Electronically signed by: Lynwood Seip MD 06/16/2024 06:09 PM EST RP Workstation: [id number]   DG C-Arm 1-60 Min-No Report Result Date: 06/16/2024 Fluoroscopy was utilized by the requesting physician.  No radiographic interpretation.  CT PELVIS WO CONTRAST Result Date: 06/16/2024 EXAM: CT Pelvis without IV contrast CLINICAL HISTORY: Hip trauma, fracture suspected, xray done. TECHNIQUE: Axial images were acquired through the pelvis without IV contrast. Reformatted images were reviewed. Dose reduction technique was used including one or more of the following: automated exposure control, adjustment of mA and kV according to patient size, and/or iterative reconstruction. CONTRAST: Without COMPARISON: Right femur and hip films from earlier today. Chest, abdomen, and  pelvis CT 10/05/2023. FINDINGS: BONES: Generalized osteopenia. Acute intertrochanteric impacted proximal right femoral fracture with medial migration of the lesser trochanter, cephalad migration of the main distal fracture fragment with the right femoral neck and shaft almost at right angles, and a slight valgus angulation of the main distal fragment. Old left hip nailing hardware in place without evidence of further acute fractures. Interval healing of bilateral pubic rami fractures, which were acute on the prior CT. No displaced sacrococcygeal fracture is seen. There is chronic wedging in the lumbar spine at L4 and L5. The superimposed degenerative change of the lower lumbar facets with hypertrophic arthropathy. JOINTS: No dislocation. Mild symmetric degenerative arthrosis at both hips. The joint spaces are otherwise normal. SOFT TISSUES: Chronic abdominal wall laxity and protrusion anteriorly and to the right greater than left with chronic inguinal fat hernias. Trace ascites noted in the posterior deep pelvis but were not seen on the prior study. There is no space-occupying hematoma in the upper right thigh. There is no pelvic mass, free hemorrhage, or free air. VASCULATURE: Aortoiliac atherosclerosis. IMPRESSION: 1. Acute intertrochanteric impacted proximal right femoral fracture with medial migration of the lesser trochanter, cephalad migration of the main distal fracture fragment with the right femoral neck and shaft almost at right angles, and a slight valgus angulation of the main distal fragment. 2. Interval healing of bilateral pubic rami fractures, which were acute on the prior CT. 3. Mild symmetric degenerative arthrosis at both hips with old left hip nailing hardware in place without evidence of further acute fractures. Electronically signed by: Francis Quam MD 06/16/2024 08:00 AM EST RP Workstation: [id number]   DG Pelvis Portable Result Date: 06/16/2024 EXAM: 1 or 2 VIEW(S) XRAY OF THE PELVIS  06/16/2024 03:58:00 AM COMPARISON: CT chest abdomen pelvis 10/05/2023. CLINICAL HISTORY: R hip pain post mechanical fall. FINDINGS: BONES: Left hip ORIF hardware in place. Acute displaced and impacted intertrochanteric fracture of the right hip. Proximal migration of the femoral shaft. Displacement of the lesser trochanter. Chronic fracture deformity of the bilateral pubic rami. Diffuse osteopenia. JOINTS: Degenerative changes of the visualized lower lumbar spine. There is mild symmetric arthrosis of the hip joints. SOFT TISSUES: The soft tissues are unremarkable. IMPRESSION: 1. Acute displaced and impacted intertrochanteric fracture of the right hip with proximal migration of the femoral shaft and displacement of the lesser trochanter. 2. Left hip ORIF hardware in place. 3. Chronic fracture deformity of the bilateral pubic rami. 4. Diffuse osteopenia. Electronically signed by: Francis Quam MD 06/16/2024 04:52 AM EST RP Workstation: [id number]   DG Sacrum/Coccyx Result Date: 06/16/2024 EXAM: 1 LATERAL VIEW XRAY OF THE SACRUM AND COCCYX 06/16/2024 03:58:00 AM COMPARISON: Chest abdomen and pelvis CT 10/05/2023. CLINICAL HISTORY: Fall with coccyx pain, R hip pain. FINDINGS: BONES AND JOINTS: On the lateral view, the coccyx is largely obscured by what appears to be an underlying backboard. There is diffuse osteopenia. There is a subtle cortical depression at the junction of S4 and S5 on the lateral view not clearly demonstrated on CT, which could be a nondisplaced  fracture. There is no evidence of displaced fractures. There are chronic healed fracture deformities of the bilateral pubic rami, which were previously acute. There is no visible step off in the sacral arcuate lines. No malalignment. SOFT TISSUES: The soft tissues are unremarkable. IMPRESSION: 1. Subtle cortical depression at the junction of S4 and S5 on the lateral view, possibly representing a nondisplaced fracture. 2. No other fractures identified.  Diffuse osteopenia. 3. Chronic healed fracture deformities of the bilateral pubic rami, previously acute. Electronically signed by: Francis Quam MD 06/16/2024 04:14 AM EST RP Workstation: [id number]   DG Femur Min 2 Views Right Result Date: 06/16/2024 EXAM: 2 VIEW(S) XRAY OF THE RIGHT FEMUR 06/16/2024 03:56:00 AM COMPARISON: Chest abdomen pelvis CT 10/05/2023. CLINICAL HISTORY: R hip pain post mechanical fall. FINDINGS: BONES AND JOINTS: Diffuse osteopenia. Acute impacted intertrochanteric proximal right femoral fracture with cephalad displacement of the distal fragment such that the femoral neck and shaft are almost at right angles. The main distal fragment shows mild varus angulation. Mild degenerative arthrosis of the right hip and knee. The right pelvic ring is grossly intact. SOFT TISSUES: Patchy calcifications in the right superficial femoral artery. Soft tissues otherwise unremarkable. IMPRESSION: 1. Acute impacted intertrochanteric proximal right femoral fracture with cephalad displacement of the distal fragment and mild varus angulation. 2. Diffuse osteopenia. Electronically signed by: Francis Quam MD 06/16/2024 04:05 AM EST RP Workstation: [id number]   Scheduled Meds:  aspirin  EC  81 mg Oral Daily   atorvastatin   80 mg Oral Daily   docusate sodium   100 mg Oral BID   enoxaparin  (LOVENOX ) injection  40 mg Subcutaneous Q24H   levothyroxine   50 mcg Oral Q0600   Continuous Infusions:  sodium chloride  40 mL/hr at 06/16/24 1716     LOS: 1 day    Time spent: 50 Mins    Darcel Dawley, MD [facility]   If 7PM-7AM, please contact night-coverage

## 2024-06-18 LAB — CBC
HCT: 21.9 % — ABNORMAL LOW (ref 36.0–46.0)
Hemoglobin: 7.3 g/dL — ABNORMAL LOW (ref 12.0–15.0)
MCH: 35.1 pg — ABNORMAL HIGH (ref 26.0–34.0)
MCHC: 33.3 g/dL (ref 30.0–36.0)
MCV: 105.3 fL — ABNORMAL HIGH (ref 80.0–100.0)
Platelets: 172 K/uL (ref 150–400)
RBC: 2.08 MIL/uL — ABNORMAL LOW (ref 3.87–5.11)
RDW: 13.8 % (ref 11.5–15.5)
WBC: 6.5 K/uL (ref 4.0–10.5)
nRBC: 0 % (ref 0.0–0.2)

## 2024-06-18 LAB — PREPARE RBC (CROSSMATCH)

## 2024-06-18 MED ORDER — SODIUM CHLORIDE 0.9% IV SOLUTION: Freq: Once | INTRAVENOUS | Status: AC

## 2024-06-18 NOTE — Progress Notes (Signed)
 Subjective: Patient reports pain as moderate.  Tolerating diet.  Urinating.   No CP, SOB.  Has not mobilized OOB with PT/OT yet d/t pain and anxiety during session yesterday.   Objective:   VITALS:   Vitals:   06/17/24 1615 06/17/24 1930 06/18/24 0555 06/18/24 0750  BP: (!) 140/64 (!) 145/71 (!) 150/66 (!) 148/72  Pulse: 98  100 100  Resp: 16 18 19 16   Temp: 98.9 F (37.2 C) 97.9 F (36.6 C) 98.4 F (36.9 C) 98 F (36.7 C)  TempSrc: Oral Oral Oral Oral  SpO2: 95% 96% 96% 96%  Weight:      Height:          Latest Ref Rng & Units 06/18/2024    2:50 AM 06/17/2024    4:33 AM 06/16/2024    5:02 AM  CBC  WBC 4.0 - 10.5 K/uL 6.5  6.4  7.6   Hemoglobin 12.0 - 15.0 g/dL 7.3  8.0  89.0   Hematocrit 36.0 - 46.0 % 21.9  23.5  33.5   Platelets 150 - 400 K/uL 172  182  246       Latest Ref Rng & Units 06/17/2024    4:33 AM 06/16/2024    5:02 AM 10/12/2023    7:20 AM  BMP  Glucose 70 - 99 mg/dL 871  873  95   BUN 8 - 23 mg/dL 29  18  15    Creatinine 0.44 - 1.00 mg/dL 9.34  9.57  9.66   Sodium 135 - 145 mmol/L 134  136  126   Potassium 3.5 - 5.1 mmol/L 4.5  4.1  3.7   Chloride 98 - 111 mmol/L 105  103  95   CO2 22 - 32 mmol/L 24  24  21    Calcium  8.9 - 10.3 mg/dL 8.3  8.5  8.3    Intake/Output      12/12 0701 12/13 0700 12/13 0701 12/14 0700   P.O. 240    I.V. (mL/kg) 773.5 (14.2)    IV Piggyback 300    Total Intake(mL/kg) 1313.5 (24.1)    Urine (mL/kg/hr) 250 (0.2)    Blood     Total Output 250    Net +1063.5            Physical Exam: General: NAD.  Resting but easily awoken, calm, comfortable Resp: No increased wob Cardio: regular rate and rhythm ABD soft Neurologically intact MSK Neurovascularly intact Sensation intact distally Intact pulses distally Dorsiflexion/Plantar flexion intact Incision: dressing C/D/I   Assessment: 2 Days Post-Op  S/P Procedures (LRB): FIXATION, FRACTURE, INTERTROCHANTERIC, WITH INTRAMEDULLARY ROD (Right) by Dr. Kendal  on 06/16/24  Principal Problem:   Femoral fracture (HCC) Active Problems:   CAD (coronary artery disease)   Hyperlipidemia   Hypothyroidism   Decreased hearing   Fall at home, initial encounter   Macrocytic anemia    Plan:  Advance diet Up with therapy Incentive Spirometry Elevate and Apply ice  Weightbearing: WBAT RLE, unrestricted ROM Insicional and dressing care: Reinforce dressings as needed Orthopedic device(s): None Showering: Keep dressing dry VTE prophylaxis: Lovenox  40mg  qd while inpatient, can switch to Eliquis  upon d/c, SCDs, ambulation Pain control: PRN, limit narcotics as able d/t age and dementia risk Follow - up plan: 2 weeks in office with Dr. Kendal Pass information for today:  Natasha Ada MD, Gerard Large PA-C  Dispo: PT/OT recommending SNF and patient agreeable. Ok to d/c from ortho standpoint once bed available and medically ready. D/c  meds printed and signed in chart.    Gerard CHRISTELLA Large, PA-C Office [phone] 06/18/2024, 11:46 AM

## 2024-06-18 NOTE — TOC Progression Note (Signed)
 Transition of Care [facility]) - Progression Note    Patient Details  Name: Natasha Harper MRN: [id number] Date of Birth: [birth year]  [facility] CM/SW Contact  Bridget Cordella Simmonds, LCSW Phone Number: 06/18/2024, 1:19 PM  Clinical Narrative:   Bed offers provided to pt and husband Maude on [facility] choice document.  He will review.  1250: CSW spoke with Maude and pt: they will accept offer at [facility].    Expected Discharge Plan: Skilled Nursing Facility Barriers to Discharge: Continued Medical Work up, English As A [employer] Language [employer], SNF Pending bed offer               Expected Discharge Plan and Services     Post Acute Care Choice: Skilled Nursing Facility Living arrangements for the past 2 months: Single Family Home                                       Social Drivers of Health (SDOH) Interventions SDOH Screenings   Food Insecurity: No Food Insecurity (06/16/2024)  Housing: Low Risk (06/16/2024)  Transportation Needs: No Transportation Needs (06/16/2024)  Utilities: Patient Declined (06/16/2024)  Depression (PHQ2-9): Low Risk (01/28/2022)  Social Connections: Socially Isolated (06/16/2024)  Tobacco Use: Medium Risk (06/16/2024)    Readmission Risk Interventions     No data to display

## 2024-06-18 NOTE — Progress Notes (Addendum)
 PROGRESS NOTE    Natasha Harper  FMW:[id number] DOB: Aug 03, 1934 DOA: 06/16/2024 PCP: Charlett Apolinar POUR, MD  Brief Narrative:  This 89 yrs old female with PMH significant for hypertension, hyperlipidemia, CAD, hypothyroidism, left hip fracture s/p ORIF presents with a fall resulting in fractures of the left hip and tailbone.  X-ray imaging revealed acute impacted intertrochanteric proximal right femoral fracture, suspected nondisplaced fracture of the S4 and S5 junction, and healed chronic fracture deformity of the bilateral pubic rami.  Dr. Kendal of orthopedics was consulted and recommended medical admission. Patient is status post ORIF.  POD #1.  PT/OT recommended SNF.  Assessment & Plan:   Principal Problem:   Femoral fracture (HCC) Active Problems:   Fall at home, initial encounter   CAD (coronary artery disease)   Hyperlipidemia   Macrocytic anemia   Hypothyroidism   Decreased hearing  Intertrochanteric proximal right femoral and sacral fracture secondary to fall: Patient presented status post mechanical fall at home with right hip pain. Reportedly lost her balance.  Denied any loss of consciousness.  X-rays revealed acute intertrochanter right femur fracture and suspected nondisplaced sacral fracture.   Orthopedics was consulted.  She underwent ORIF.  POD # 2. Continue Hydrocodone /morphine  IV as needed for mild to moderate pain respectively. WBAT RLE, unrestricted ROM. Continue Lovenox  while inpatient can switch to Eliquis  upon DC. Follow-up with Dr. Kendal in 2 weeks. PT and OT recommended SNF. TOC is working on finding placement.   CAD: Hyperlipidemia: Continue aspirin  and atorvastatin .   Macrocytic anemia Hemoglobin 10.9 with MCV 108.4 and MCH 35.3. Vitamin D  and vitamin B12 within normal range. Hemoglobin dropped from 8.0-7.3 likely postoperative. Transfuse 1 unit PRBC.  Follow-up H&H tomorrow.   Hypothyroidism: Continue levothyroxine .   Hard of  hearing: Continue supportive care.  DVT prophylaxis: Lovenox  Code Status: Full code Family Communication: Spouse at bedside. Disposition Plan:   Status is: Inpatient Remains inpatient appropriate because: Admitted status post mechanical fall with right femur fracture status post ORIF.  POD # 2.  PT and OT recommended SNF.    Consultants:  Orthopedics  Procedures:ORIF POD # 1  Antimicrobials:  Anti-infectives (From admission, onward)    Start     Dose/Rate Route Frequency Ordered Stop   06/16/24 2000  ceFAZolin  (ANCEF ) IVPB 2g/100 mL premix        2 g 200 mL/hr over 30 Minutes Intravenous Every 8 hours 06/16/24 1551 06/17/24 1150   06/16/24 1057  ceFAZolin  (ANCEF ) 2-4 GM/100ML-% IVPB       Note to Pharmacy: Edsel Leita HERO: cabinet override      06/16/24 1057 06/16/24 2050      Subjective: Patient was seen and examined at bedside.Overnight events noted. Patient reports feeling better,  She reports having RLE pain but reasonably controlled.   Status post ORIF POD # 2.  Objective: Vitals:   06/18/24 0555 06/18/24 0750 06/18/24 1222 06/18/24 1245  BP: (!) 150/66 (!) 148/72 (!) 120/54 (!) 110/51  Pulse: 100 100 91 90  Resp: 19 16 18 20   Temp: 98.4 F (36.9 C) 98 F (36.7 C) 97.7 F (36.5 C) 98 F (36.7 C)  TempSrc: Oral Oral Oral   SpO2: 96% 96% 95% 95%  Weight:      Height:        Intake/Output Summary (Last 24 hours) at 06/18/2024 1439 Last data filed at 06/17/2024 1500 Gross per 24 hour  Intake 1073.53 ml  Output --  Net 1073.53 ml   [employer]  06/16/24 0253 06/16/24 1112  Weight: 54.4 kg 54.4 kg    Examination:  General exam: Appears calm and comfortable.  Not in any acute distress. Respiratory system: CTA Bilaterally. Respiratory effort normal. RR 16 Cardiovascular system: S1 & S2 heard, RRR. No JVD, murmurs, rubs, gallops or clicks.  Gastrointestinal system: Abdomen is non distended, soft and non tender. Normal bowel sounds heard. Central  nervous system: Alert and oriented x 3. No focal neurological deficits. Extremities: status post ORIF POD # 2. Skin: No rashes, lesions or ulcers Psychiatry: Judgement and insight appear normal. Mood & affect appropriate.     Data Reviewed: I have personally reviewed following labs and imaging studies  CBC: Recent Labs  Lab 06/16/24 0502 06/17/24 0433 06/18/24 0250  WBC 7.6 6.4 6.5  NEUTROABS 6.7  --   --   HGB 10.9* 8.0* 7.3*  HCT 33.5* 23.5* 21.9*  MCV 108.4* 104.4* 105.3*  PLT 246 182 172   Basic Metabolic Panel: Recent Labs  Lab 06/16/24 0502 06/17/24 0433  NA 136 134*  K 4.1 4.5  CL 103 105  CO2 24 24  GLUCOSE 126* 128*  BUN 18 29*  CREATININE 0.42* 0.65  CALCIUM  8.5* 8.3*   GFR: Estimated Creatinine Clearance: 39.2 mL/min (by C-G formula based on SCr of 0.65 mg/dL). Liver Function Tests: No results for input(s): AST, ALT, ALKPHOS, BILITOT, PROT, ALBUMIN in the last 168 hours. No results for input(s): LIPASE, AMYLASE in the last 168 hours. No results for input(s): AMMONIA in the last 168 hours. Coagulation Profile: No results for input(s): INR, PROTIME in the last 168 hours. Cardiac Enzymes: No results for input(s): CKTOTAL, CKMB, CKMBINDEX, TROPONINI in the last 168 hours. BNP (last 3 results) No results for input(s): PROBNP in the last 8760 hours. HbA1C: No results for input(s): HGBA1C in the last 72 hours. CBG: No results for input(s): GLUCAP in the last 168 hours. Lipid Profile: No results for input(s): CHOL, HDL, LDLCALC, TRIG, CHOLHDL, LDLDIRECT in the last 72 hours. Thyroid  Function Tests: No results for input(s): TSH, T4TOTAL, FREET4, T3FREE, THYROIDAB in the last 72 hours. Anemia Panel: Recent Labs    06/17/24 0433  VITAMINB12 477   Sepsis Labs: No results for input(s): PROCALCITON, LATICACIDVEN in the last 168 hours.  No results found for this or any previous visit (from the  past 240 hours).   Radiology Studies: No results found.  Scheduled Meds:  aspirin  EC  81 mg Oral Daily   atorvastatin   80 mg Oral Daily   docusate sodium   100 mg Oral BID   enoxaparin  (LOVENOX ) injection  40 mg Subcutaneous Q24H   levothyroxine   50 mcg Oral Q0600   Continuous Infusions:     LOS: 2 days    Time spent: 35 Mins    Darcel Dawley, MD [facility]   If 7PM-7AM, please contact night-coverage

## 2024-06-19 DIAGNOSIS — S72141A Displaced intertrochanteric fracture of right femur, initial encounter for closed fracture: Secondary | ICD-10-CM | POA: Diagnosis not present

## 2024-06-19 LAB — CBC
HCT: 24.7 % — ABNORMAL LOW (ref 36.0–46.0)
Hemoglobin: 8.3 g/dL — ABNORMAL LOW (ref 12.0–15.0)
MCH: 33.6 pg (ref 26.0–34.0)
MCHC: 33.6 g/dL (ref 30.0–36.0)
MCV: 100 fL (ref 80.0–100.0)
Platelets: 162 K/uL (ref 150–400)
RBC: 2.47 MIL/uL — ABNORMAL LOW (ref 3.87–5.11)
RDW: 17.7 % — ABNORMAL HIGH (ref 11.5–15.5)
WBC: 5.7 K/uL (ref 4.0–10.5)
nRBC: 0 % (ref 0.0–0.2)

## 2024-06-19 LAB — BPAM RBC
Blood Product Expiration Date: 202601042359
ISSUE DATE / TIME: 202512131223
Unit Type and Rh: 6200

## 2024-06-19 LAB — TYPE AND SCREEN
ABO/RH(D): A POS
Antibody Screen: NEGATIVE
Unit division: 0

## 2024-06-19 NOTE — TOC Progression Note (Signed)
 Transition of Care [facility]) - Progression Note    Patient Details  Name: Natasha Harper MRN: [id number] Date of Birth: December 20, 1934  Transition of Care [facility]) CM/SW Contact  Gwenn Frieze [place], [place] Phone Number: 06/19/2024, 4:14 PM  Clinical Narrative: Per MD, pt ready for dc pending SNF bed. Confirmed bed at [facility] with [facility] in admissions. Home [facility] Community/BCBS auth request submitted, reference [id number]. Will provide updates as available.   Frieze Gwenn, MSW, LCSW [phone] (coverage)      Expected Discharge Plan: Skilled Nursing Facility Barriers to Discharge: Continued Medical Work up, English As A Second Language Teacher, SNF Pending bed offer               Expected Discharge Plan and Services     Post Acute Care Choice: Skilled Nursing Facility Living arrangements for the past 2 months: Single Family Home                                       Social Drivers of Health (SDOH) Interventions SDOH Screenings   Food Insecurity: No Food Insecurity (06/16/2024)  Housing: Low Risk (06/16/2024)  Transportation Needs: No Transportation Needs (06/16/2024)  Utilities: Patient Declined (06/16/2024)  Depression (PHQ2-9): Low Risk (01/28/2022)  Social Connections: Socially Isolated (06/16/2024)  Tobacco Use: Medium Risk (06/16/2024)    Readmission Risk Interventions     No data to display

## 2024-06-19 NOTE — Progress Notes (Signed)
 PROGRESS NOTE    Natasha Harper  FMW:[id number] DOB: September 02, 1934 DOA: 06/16/2024 PCP: Charlett Apolinar POUR, MD  Brief Narrative:  This 89 yrs old female with PMH significant for hypertension, hyperlipidemia, CAD, hypothyroidism, left hip fracture s/p ORIF presents with a fall resulting in fractures of the left hip and tailbone.  X-ray imaging revealed acute impacted intertrochanteric proximal right femoral fracture, suspected nondisplaced fracture of the S4 and S5 junction, and healed chronic fracture deformity of the bilateral pubic rami.  Dr. Kendal of orthopedics was consulted and recommended medical admission. Patient is status post ORIF.  POD #1.  PT/OT recommended SNF.  Assessment & Plan:   Principal Problem:   Femoral fracture (HCC) Active Problems:   Fall at home, initial encounter   CAD (coronary artery disease)   Hyperlipidemia   Macrocytic anemia   Hypothyroidism   Decreased hearing  Intertrochanteric proximal right femoral and sacral fracture secondary to fall: Patient presented status post mechanical fall at home with right hip pain. Reportedly lost her balance.  Denied any loss of consciousness.  X-rays revealed acute intertrochanter right femur fracture and suspected nondisplaced sacral fracture.   Orthopedics was consulted.  She underwent ORIF.  POD # 3. Continue Hydrocodone  / morphine  IV as needed for mild to moderate pain respectively. WBAT RLE, unrestricted ROM. Continue Lovenox  while inpatient can switch to Eliquis  upon DC. Follow-up with Dr. Kendal in 2 weeks. PT and OT recommended SNF. TOC is working on finding placement.   CAD: Hyperlipidemia: Continue aspirin  and atorvastatin .   Macrocytic anemia Hemoglobin 10.9 with MCV 108.4 and MCH 35.3. Vitamin D  and vitamin B12 within normal range. Hb dropped from 8.0 ->7.3 likely postoperative. Transfuse 1 unit PRBC.  Hb improved to 8.3   Hypothyroidism: Continue levothyroxine .   Hard of hearing: Continue  supportive care.  DVT prophylaxis: Lovenox  Code Status: Full code Family Communication: Spouse at bedside. Disposition Plan:   Status is: Inpatient Remains inpatient appropriate because: Admitted status post mechanical fall with right femur fracture status post ORIF.  POD # 3.  PT and OT recommended SNF.  Medically ready for discharge awaiting SNF pending    Consultants:  Orthopedics  Procedures:ORIF POD # 1  Antimicrobials:  Anti-infectives (From admission, onward)    Start     Dose/Rate Route Frequency Ordered Stop   06/16/24 2000  ceFAZolin  (ANCEF ) IVPB 2g/100 mL premix        2 g 200 mL/hr over 30 Minutes Intravenous Every 8 hours 06/16/24 1551 06/17/24 1150   06/16/24 1057  ceFAZolin  (ANCEF ) 2-4 GM/100ML-% IVPB       Note to Pharmacy: Edsel Leita HERO: cabinet override      06/16/24 1057 06/16/24 2050      Subjective: Patient was seen and examined at bedside. Overnight events noted. Patient reports feeling better, She reports having RLE pain but reasonably controlled.   Status post ORIF POD # 3.  Objective: Vitals:   06/18/24 1500 06/18/24 2001 06/19/24 0303 06/19/24 0735  BP: (!) 134/59 (!) 133/58 (!) 142/68 (!) 156/66  Pulse: 93 91 93 91  Resp: 20 20 15 17   Temp: 98.6 F (37 C) 98.7 F (37.1 C) 98.6 F (37 C) 97.8 F (36.6 C)  TempSrc: Oral Oral  Oral  SpO2: 93% 96% 96% 93%  Weight:      Height:        Intake/Output Summary (Last 24 hours) at 06/19/2024 9041 Last data filed at 06/18/2024 2001 Gross per 24 hour  Intake 580.5 ml  Output 400 ml  Net 180.5 ml   Filed Weights   06/16/24 0253 06/16/24 1112  Weight: 54.4 kg 54.4 kg    Examination:  General exam: Appears calm and comfortable.  Not in any acute distress. Respiratory system: CTA Bilaterally. Respiratory effort normal. RR 14 Cardiovascular system: S1 & S2 heard, RRR. No JVD, murmurs, rubs, gallops or clicks.  Gastrointestinal system: Abdomen is non distended, soft and non tender.  Normal bowel sounds heard. Central nervous system: Alert and oriented x 3. No focal neurological deficits. Extremities: status post ORIF POD # 3. Skin: No rashes, lesions or ulcers Psychiatry: Judgement and insight appear normal. Mood & affect appropriate.     Data Reviewed: I have personally reviewed following labs and imaging studies  CBC: Recent Labs  Lab 06/16/24 0502 06/17/24 0433 06/18/24 0250 06/19/24 0343  WBC 7.6 6.4 6.5 5.7  NEUTROABS 6.7  --   --   --   HGB 10.9* 8.0* 7.3* 8.3*  HCT 33.5* 23.5* 21.9* 24.7*  MCV 108.4* 104.4* 105.3* 100.0  PLT 246 182 172 162   Basic Metabolic Panel: Recent Labs  Lab 06/16/24 0502 06/17/24 0433  NA 136 134*  K 4.1 4.5  CL 103 105  CO2 24 24  GLUCOSE 126* 128*  BUN 18 29*  CREATININE 0.42* 0.65  CALCIUM  8.5* 8.3*   GFR: Estimated Creatinine Clearance: 39.2 mL/min (by C-G formula based on SCr of 0.65 mg/dL). Liver Function Tests: No results for input(s): AST, ALT, ALKPHOS, BILITOT, PROT, ALBUMIN in the last 168 hours. No results for input(s): LIPASE, AMYLASE in the last 168 hours. No results for input(s): AMMONIA in the last 168 hours. Coagulation Profile: No results for input(s): INR, PROTIME in the last 168 hours. Cardiac Enzymes: No results for input(s): CKTOTAL, CKMB, CKMBINDEX, TROPONINI in the last 168 hours. BNP (last 3 results) No results for input(s): PROBNP in the last 8760 hours. HbA1C: No results for input(s): HGBA1C in the last 72 hours. CBG: No results for input(s): GLUCAP in the last 168 hours. Lipid Profile: No results for input(s): CHOL, HDL, LDLCALC, TRIG, CHOLHDL, LDLDIRECT in the last 72 hours. Thyroid  Function Tests: No results for input(s): TSH, T4TOTAL, FREET4, T3FREE, THYROIDAB in the last 72 hours. Anemia Panel: Recent Labs    06/17/24 0433  VITAMINB12 477   Sepsis Labs: No results for input(s): PROCALCITON, LATICACIDVEN in  the last 168 hours.  No results found for this or any previous visit (from the past 240 hours).   Radiology Studies: No results found.  Scheduled Meds:  aspirin  EC  81 mg Oral Daily   atorvastatin   80 mg Oral Daily   docusate sodium   100 mg Oral BID   enoxaparin  (LOVENOX ) injection  40 mg Subcutaneous Q24H   levothyroxine   50 mcg Oral Q0600   Continuous Infusions:     LOS: 3 days    Time spent: 35 Mins    Darcel Dawley, MD [facility]   If 7PM-7AM, please contact night-coverage

## 2024-06-20 LAB — HEMOGLOBIN AND HEMATOCRIT, BLOOD
HCT: 24.4 % — ABNORMAL LOW (ref 36.0–46.0)
Hemoglobin: 8.1 g/dL — ABNORMAL LOW (ref 12.0–15.0)

## 2024-06-20 NOTE — TOC Progression Note (Addendum)
 Transition of Care [facility]) - Progression Note    Patient Details  Name: Natasha Harper MRN: [id number] Date of Birth: [birth year]  Transition of [facility]) CM/SW Contact  Bridget Cordella Simmonds, LCSW Phone Number: 06/20/2024, 8:27 AM  Clinical Narrative:   Unable to find pending auth in [employer], called[facility]  Auth still pending.    1400: TC [employer] BCBS: P2P offered, deadline is tomorrow 9am. [phone], option 5.   MD notified.  1500: New PT note uploaded to [facility] as requested.  1625: SNF auth approved: [id number], 3 days: 12/15-12/17.  Expected Discharge Plan: Skilled Nursing Facility Barriers to Discharge: Continued Medical Work up, English As A Second Language Teacher, SNF Pending bed offer               Expected Discharge Plan and Services     Post Acute Care Choice: Skilled Nursing Facility Living arrangements for the past 2 months: Single Family Home                                       Social Drivers of Health (SDOH) Interventions SDOH Screenings   Food Insecurity: No Food Insecurity (06/16/2024)  Housing: Low Risk (06/16/2024)  Transportation Needs: No Transportation Needs (06/16/2024)  Utilities: Patient Declined (06/16/2024)  Depression (PHQ2-9): Low Risk (01/28/2022)  Social Connections: Socially Isolated (06/16/2024)  Tobacco Use: Medium Risk (06/16/2024)    Readmission Risk Interventions     No data to display

## 2024-06-20 NOTE — Progress Notes (Signed)
 Physical Therapy Treatment Patient Details Name: Natasha Harper MRN: [id number] DOB: 07/26/1934 Today's Date: 06/20/2024   History of Present Illness Pt is a 89 y.o. F who presents with a fall with intertrochanteric proximal R femoral and sacral fracture s/p cephalomedullary nailing. Significant PMH: CAD, L hip fracture 2023, HTN, osteoporosis.    PT Comments  Pt progressing towards her physical therapy goals. Premedicated prior to session, however, pain levels still remain high with mobilization. Pt requiring moderate assist for bed mobility and stand pivot transfers. Performed stand pivot to bedside commode for pt to have a BM and then to recliner with RW. Pt also able to participate in a few AAROM exercises at bed level. Patient will benefit from continued inpatient follow up therapy, <3 hours/day to address strengthening, ROM, mobility.     If plan is discharge home, recommend the following: Two people to help with bathing/dressing/bathroom;A lot of help with walking and/or transfers   Can travel by private vehicle        Equipment Recommendations  Other (comment) (defer)    Recommendations for Other Services       Precautions / Restrictions Precautions Precautions: Fall Restrictions Weight Bearing Restrictions Per Provider Order: No     Mobility  Bed Mobility Overal bed mobility: Needs Assistance Bed Mobility: Supine to Sit     Supine to sit: Mod assist     General bed mobility comments: Step by step cueing for initiation, assist for progressing BLE's off edge of bed, HOB elevated and use of bed rail to elevate trunk. Bed pad utilized to swivel hips to edge of bed    Transfers Overall transfer level: Needs assistance Equipment used: None Transfers: Bed to chair/wheelchair/BSC, Sit to/from Stand Sit to Stand: Mod assist Stand pivot transfers: Mod assist         General transfer comment: Face to face transfer bed > BSC with modA. Additional stand up from chair  to RW with crouched posture; able to take side steps over to chair with increased time/effort, decreased eccentric control down to sitting    Ambulation/Gait               General Gait Details: unable   Stairs             Wheelchair Mobility     Tilt Bed    Modified Rankin (Stroke Patients Only)       Balance Overall balance assessment: Needs assistance Sitting-balance support: Feet supported Sitting balance-[name]: Fair     Standing balance support: Bilateral upper extremity supported, Reliant on assistive device for balance Standing balance-[name]: Poor                              Communication Communication Communication: Impaired Factors Affecting Communication: Hearing impaired  Cognition Arousal: Alert Behavior During Therapy: WFL for tasks assessed/performed   PT - Cognitive impairments: Initiation, Sequencing, Safety/Judgement, Awareness                         Following commands: Impaired      Cueing Cueing Techniques: Verbal cues  Exercises General Exercises - Lower Extremity Ankle Circles/Pumps: AROM, Right, 5 reps, Supine Heel Slides: AAROM, Right, 5 reps, Supine Hip ABduction/ADduction: AAROM, Right, 5 reps, Supine    General Comments        Pertinent Vitals/Pain Pain Assessment Pain Assessment: 0-10 Pain Score: 8  Pain Location: R hip with  mobilization Pain Descriptors / Indicators: Operative site guarding, Moaning, Grimacing, Sharp Pain Intervention(s): Limited activity within patient's tolerance, Monitored during session, Premedicated before session    Home Living                          Prior Function            PT Goals (current goals can now be found in the care plan section) Acute Rehab PT Goals Patient Stated Goal: agreeable to short term rehab, less pain PT Goal Formulation: With patient/family Time For Goal Achievement: 07/01/24 Potential to Achieve Goals:  Fair Progress towards PT goals: Progressing toward goals    Frequency    Min 2X/week      PT Plan      Co-evaluation              AM-PAC PT 6 Clicks Mobility   Outcome Measure  Help needed turning from your back to your side while in a flat bed without using bedrails?: A Lot Help needed moving from lying on your back to sitting on the side of a flat bed without using bedrails?: A Lot Help needed moving to and from a bed to a chair (including a wheelchair)?: A Lot Help needed standing up from a chair using your arms (e.g., wheelchair or bedside chair)?: Total Help needed to walk in hospital room?: Total Help needed climbing 3-5 steps with a railing? : Total 6 Click Score: 9    End of Session Equipment Utilized During Treatment: Gait belt Activity Tolerance: Patient limited by pain Patient left: with call bell/phone within reach;in chair;with chair alarm set Nurse Communication: Mobility status PT Visit Diagnosis: Pain;Other abnormalities of gait and mobility (R26.89)     Time: 9093-9059 PT Time Calculation (min) (ACUTE ONLY): 34 min  Charges:    $Therapeutic Activity: 23-37 mins PT General Charges $$ ACUTE PT VISIT: 1 Visit                     Aleck Daring, PT, DPT Acute Rehabilitation Services Office [phone]    Aleck ONEIDA Daring 06/20/2024, 12:11 PM

## 2024-06-20 NOTE — Care Management Important Message (Signed)
 Important Message  Patient Details  Name: Natasha Harper MRN: [id number] Date of Birth: 19-Aug-1934   Important Message Given:  Yes - Medicare IM     [name] 06/20/2024, 3:45 PM

## 2024-06-20 NOTE — Progress Notes (Signed)
 PROGRESS NOTE    Natasha Harper  FMW:[id number] DOB: 1935-07-04 DOA: 06/16/2024 PCP: Charlett Apolinar POUR, MD  Brief Narrative:  This 89 yrs old female with PMH significant for hypertension, hyperlipidemia, CAD, hypothyroidism, left hip fracture s/p ORIF presents with a fall resulting in fractures of the left hip and tailbone.  X-ray imaging revealed acute impacted intertrochanteric proximal right femoral fracture, suspected nondisplaced fracture of the S4 and S5 junction, and healed chronic fracture deformity of the bilateral pubic rami.  Dr. Kendal of orthopedics was consulted and recommended medical admission. Patient is status post ORIF.  POD #1.  PT/OT recommended SNF.  Assessment & Plan:   Principal Problem:   Femoral fracture (HCC) Active Problems:   Fall at home, initial encounter   CAD (coronary artery disease)   Hyperlipidemia   Macrocytic anemia   Hypothyroidism   Decreased hearing  Intertrochanteric proximal right femoral and sacral fracture secondary to fall: Patient presented status post mechanical fall at home with right hip pain. Reportedly lost her balance.  Denied any loss of consciousness.  X-rays revealed acute intertrochanter right femur fracture and suspected nondisplaced sacral fracture.   Orthopedics was consulted.  She underwent ORIF.  POD # 4. Continue Hydrocodone  / morphine  IV as needed for mild to moderate pain respectively. WBAT RLE, unrestricted ROM. Continue Lovenox  while inpatient can switch to Eliquis  upon DC. Follow-up with Dr. Kendal in 2 weeks. PT and OT recommended SNF.. TOC is working on finding placement.  SNF placement require P2P review   CAD: Hyperlipidemia: Continue aspirin  and atorvastatin .   Macrocytic anemia Hemoglobin 10.9 with MCV 108.4 and MCH 35.3. Vitamin D  and vitamin B12 within normal range. Hb dropped from 8.0 ->7.3 likely postoperative. Transfuse 1 unit PRBC.  Hb improved to 8.3   Hypothyroidism: Continue  levothyroxine .   Hard of hearing: Continue supportive care.  DVT prophylaxis: Lovenox  Code Status: Full code Family Communication: Spouse at bedside. Disposition Plan:   Status is: Inpatient Remains inpatient appropriate because: Admitted status post mechanical fall with right femur fracture status post ORIF.  POD # 4.  PT and OT recommended SNF.  Medically ready for discharge awaiting SNF pending    Consultants:  Orthopedics  Procedures:ORIF POD # 4  Antimicrobials:  Anti-infectives (From admission, onward)    Start     Dose/Rate Route Frequency Ordered Stop   06/16/24 2000  ceFAZolin  (ANCEF ) IVPB 2g/100 mL premix        2 g 200 mL/hr over 30 Minutes Intravenous Every 8 hours 06/16/24 1551 06/17/24 1150   06/16/24 1057  ceFAZolin  (ANCEF ) 2-4 GM/100ML-% IVPB       Note to Pharmacy: Edsel Leita HERO: cabinet override      06/16/24 1057 06/16/24 2050      Subjective: Patient was seen and examined at bedside. Overnight events noted. Patient reports feeling better, She reports having RLE pain but reasonably controlled.   Status post ORIF POD # 4.  She was comfortably sitting in the recliner.  Objective: Vitals:   06/19/24 1433 06/19/24 1957 06/20/24 0402 06/20/24 0756  BP: (!) 138/97 (!) 135/56 (!) 173/69 (!) 159/60  Pulse: 89 94 91 81  Resp: 17 18 18 16   Temp: 98.2 F (36.8 C) 98 F (36.7 C) 98.1 F (36.7 C) 97.8 F (36.6 C)  TempSrc: Oral     SpO2: 95% 95% 96% 94%  Weight:      Height:        Intake/Output Summary (Last 24 hours) at 06/20/2024 1420  Last data filed at 06/20/2024 1100 Gross per 24 hour  Intake 360 ml  Output 700 ml  Net -340 ml   Filed Weights   06/16/24 0253 06/16/24 1112  Weight: 54.4 kg 54.4 kg    Examination:  General exam: Appears calm and comfortable.  Not in any acute distress. Respiratory system: CTA Bilaterally. Respiratory effort normal. RR 15 Cardiovascular system: S1 & S2 heard, RRR. No JVD, murmurs, rubs, gallops or  clicks.  Gastrointestinal system: Abdomen is non distended, soft and non tender. Normal bowel sounds heard. Central nervous system: Alert and oriented x 3. No focal neurological deficits. Extremities: status post ORIF POD # 4. Skin: No rashes, lesions or ulcers Psychiatry: Judgement and insight appear normal. Mood & affect appropriate.     Data Reviewed: I have personally reviewed following labs and imaging studies  CBC: Recent Labs  Lab 06/16/24 0502 06/17/24 0433 06/18/24 0250 06/19/24 0343 06/20/24 0609  WBC 7.6 6.4 6.5 5.7  --   NEUTROABS 6.7  --   --   --   --   HGB 10.9* 8.0* 7.3* 8.3* 8.1*  HCT 33.5* 23.5* 21.9* 24.7* 24.4*  MCV 108.4* 104.4* 105.3* 100.0  --   PLT 246 182 172 162  --    Basic Metabolic Panel: Recent Labs  Lab 06/16/24 0502 06/17/24 0433  NA 136 134*  K 4.1 4.5  CL 103 105  CO2 24 24  GLUCOSE 126* 128*  BUN 18 29*  CREATININE 0.42* 0.65  CALCIUM  8.5* 8.3*   GFR: Estimated Creatinine Clearance: 39.2 mL/min (by C-G formula based on SCr of 0.65 mg/dL). Liver Function Tests: No results for input(s): AST, ALT, ALKPHOS, BILITOT, PROT, ALBUMIN in the last 168 hours. No results for input(s): LIPASE, AMYLASE in the last 168 hours. No results for input(s): AMMONIA in the last 168 hours. Coagulation Profile: No results for input(s): INR, PROTIME in the last 168 hours. Cardiac Enzymes: No results for input(s): CKTOTAL, CKMB, CKMBINDEX, TROPONINI in the last 168 hours. BNP (last 3 results) No results for input(s): PROBNP in the last 8760 hours. HbA1C: No results for input(s): HGBA1C in the last 72 hours. CBG: No results for input(s): GLUCAP in the last 168 hours. Lipid Profile: No results for input(s): CHOL, HDL, LDLCALC, TRIG, CHOLHDL, LDLDIRECT in the last 72 hours. Thyroid  Function Tests: No results for input(s): TSH, T4TOTAL, FREET4, T3FREE, THYROIDAB in the last 72 hours. Anemia  Panel: No results for input(s): VITAMINB12, FOLATE, FERRITIN, TIBC, IRON, RETICCTPCT in the last 72 hours.  Sepsis Labs: No results for input(s): PROCALCITON, LATICACIDVEN in the last 168 hours.  No results found for this or any previous visit (from the past 240 hours).   Radiology Studies: No results found.  Scheduled Meds:  aspirin  EC  81 mg Oral Daily   atorvastatin   80 mg Oral Daily   docusate sodium   100 mg Oral BID   enoxaparin  (LOVENOX ) injection  40 mg Subcutaneous Q24H   levothyroxine   50 mcg Oral Q0600   Continuous Infusions:     LOS: 4 days    Time spent: 35 Mins    Darcel Dawley, MD [facility]   If 7PM-7AM, please contact night-coverage

## 2024-06-21 MED ORDER — AMLODIPINE BESYLATE 5 MG PO TABS
5.0000 mg | ORAL_TABLET | Freq: Every day | ORAL | Status: DC
  Administered 2024-06-21 – 2024-06-22 (×2): 5 mg via ORAL
  Filled 2024-06-21 (×2): qty 1

## 2024-06-21 MED ORDER — HYDRALAZINE HCL 25 MG PO TABS
25.0000 mg | ORAL_TABLET | Freq: Three times a day (TID) | ORAL | Status: DC
  Administered 2024-06-21 – 2024-06-22 (×3): 25 mg via ORAL
  Filled 2024-06-21 (×3): qty 1

## 2024-06-21 NOTE — Progress Notes (Signed)
 Occupational Therapy Treatment Patient Details Name: Natasha Harper MRN: [id number] DOB: [birth year] Today's Date: 06/21/2024   History of present illness Pt is a 89 y.o. F who presents with a fall with intertrochanteric proximal R femoral and sacral fracture s/p cephalomedullary nailing. Significant PMH: CAD, L hip fracture 2023, HTN, osteoporosis.   OT comments  Pt progressing towards functional goals. Focus of session on progressing functional mobility and increasing independence with ADL tasks. Pt required up to Max A for functional transfers and Min A for ADL tasks engaged in this session. Pt continues to benefit from acute OT services to facilitate progress towards goals. Continue per POC.       If plan is discharge home, recommend the following:  A lot of help with walking and/or transfers;Two people to help with walking and/or transfers;A lot of help with bathing/dressing/bathroom;Two people to help with bathing/dressing/bathroom   Equipment Recommendations  Other (comment) (defer next venue)    Recommendations for Other Services      Precautions / Restrictions Precautions Precautions: Fall Recall of Precautions/Restrictions: Impaired Restrictions Weight Bearing Restrictions Per Provider Order: Yes RLE Weight Bearing Per Provider Order: Weight bearing as tolerated       Mobility Bed Mobility Overal bed mobility: Needs Assistance Bed Mobility: Supine to Sit, Sit to Supine     Supine to sit: Max assist, HOB elevated, Used rails Sit to supine: Max assist   General bed mobility comments: Max A to engage in bed mobility this session with premedication. Max A to manage BLE off of bed and to elevate trunk from bed. Max A to return to supine. +2 required to reposition in bed.    Transfers Overall transfer level: Needs assistance Equipment used: Rolling walker (2 wheels) Transfers: Sit to/from Stand Sit to Stand: Mod assist, From elevated surface           General  transfer comment: Mod A to rise from bed slightly elevated. Dense multimodal cues to take small steps towards HOB. Pt shuffled feet laterally with Mod A and management of RW.     Balance Overall balance assessment: Needs assistance Sitting-balance support: Bilateral upper extremity supported, Feet supported Sitting balance-[name]: Fair Sitting balance - Comments: BUE for support, close supervision   Standing balance support: Bilateral upper extremity supported, During functional activity, Reliant on assistive device for balance Standing balance-[name]: Poor Standing balance comment: Dependent on RW and external support                           ADL either performed or assessed with clinical judgement   ADL Overall ADL's : Needs assistance/impaired                 Upper Body Dressing : Minimal assistance;Sitting           Toileting- Clothing Manipulation and Hygiene: Total assistance Toileting - Clothing Manipulation Details (indicate cue type and reason): Purewick, repositioned            Extremity/Trunk Assessment Upper Extremity Assessment Upper Extremity Assessment: Generalized weakness            Vision       Perception     Praxis     Communication Communication Communication: Impaired Factors Affecting Communication: Hearing impaired   Cognition Arousal: Alert Behavior During Therapy: Anxious Cognition: Cognition impaired     Awareness: Online awareness impaired Memory impairment (select all impairments): Working memory Attention impairment (select first level of impairment):  Sustained attention Executive functioning impairment (select all impairments): Initiation, Organization, Sequencing, Problem solving, Reasoning OT - Cognition Comments: inconsistent reports of being HOH, instructed OT to speak at a lower volume because she had her hearing aids in. When speaking in lower volume, Pt could not hear OT. Anxious with  mobility, self-limiting abilities                 Following commands: Impaired Following commands impaired: Follows one step commands inconsistently, Follows one step commands with increased time      Cueing   Cueing Techniques: Verbal cues, Tactile cues, Visual cues  Exercises      Shoulder Instructions       General Comments VSS on RA. Educated Pt on importance of continued mobility. Pt pleasant throughout session    Pertinent Vitals/ Pain       Pain Assessment Pain Assessment: Faces Faces Pain Scale: Hurts whole lot Pain Location: R hip with mobilization Pain Descriptors / Indicators: Discomfort, Grimacing, Guarding, Moaning Pain Intervention(s): Limited activity within patient's tolerance, Monitored during session, Premedicated before session, Repositioned  Home Living                                          Prior Functioning/Environment              Frequency  Min 2X/week        Progress Toward Goals  OT Goals(current goals can now be found in the care plan section)  Progress towards OT goals: Progressing toward goals  Acute Rehab OT Goals Patient Stated Goal: to get better OT Goal Formulation: With patient/family Time For Goal Achievement: 07/01/24 Potential to Achieve Goals: Good ADL Goals Pt Will Perform Lower Body Bathing: with mod assist;sit to/from stand;sitting/lateral leans Pt Will Perform Lower Body Dressing: with mod assist;sit to/from stand;sitting/lateral leans Pt Will Transfer to Toilet: with min assist;stand pivot transfer;bedside commode Additional ADL Goal #1: Pt to complete bed mobility with Mod A in prep for EOB/OOB ADLs  Plan      Co-evaluation                 AM-PAC OT 6 Clicks Daily Activity     Outcome Measure   Help from another person eating meals?: None Help from another person taking care of personal grooming?: A Little Help from another person toileting, which includes using toliet,  bedpan, or urinal?: Total Help from another person bathing (including washing, rinsing, drying)?: A Lot Help from another person to put on and taking off regular upper body clothing?: A Little Help from another person to put on and taking off regular lower body clothing?: Total 6 Click Score: 14    End of Session Equipment Utilized During Treatment: Gait belt;Rolling walker (2 wheels)  OT Visit Diagnosis: Unsteadiness on feet (R26.81);Other abnormalities of gait and mobility (R26.89);Muscle weakness (generalized) (M62.81)   Activity Tolerance Patient limited by pain   Patient Left in bed;with call bell/phone within reach;with bed alarm set   Nurse Communication Mobility status        Time: 8462-8391 OT Time Calculation (min): 31 min  Charges: OT General Charges $OT Visit: 1 Visit OT Treatments $Therapeutic Activity: 23-37 mins  Maurilio CROME, OTR/L.  [facility] Acute Rehabilitation  Office: [phone]   Maurilio PARAS Worthington Cruzan 06/21/2024, 5:02 PM

## 2024-06-21 NOTE — Plan of Care (Signed)

## 2024-06-21 NOTE — TOC Progression Note (Signed)
 Transition of Care [facility]) - Progression Note    Patient Details  Name: Natasha Harper MRN: [id number] Date of Birth: May 05, 1935  [facility] CM/SW Contact  Bridget Cordella Simmonds, LCSW Phone Number: 06/21/2024, 10:55 AM  Clinical Narrative:   Per MD, no DC today.  SNF notified.    Expected Discharge Plan: Skilled Nursing Facility Barriers to Discharge: Continued Medical Work up, English As A Second Language Teacher, SNF Pending bed offer               Expected Discharge Plan and Services     Post Acute Care Choice: Skilled Nursing Facility Living arrangements for the past 2 months: Single Family Home                                       Social Drivers of Health (SDOH) Interventions SDOH Screenings   Food Insecurity: No Food Insecurity (06/16/2024)  Housing: Low Risk (06/16/2024)  Transportation Needs: No Transportation Needs (06/16/2024)  Utilities: Patient Declined (06/16/2024)  Depression (PHQ2-9): Low Risk (01/28/2022)  Social Connections: Socially Isolated (06/16/2024)  Tobacco Use: Medium Risk (06/16/2024)    Readmission Risk Interventions     No data to display

## 2024-06-21 NOTE — Progress Notes (Signed)
 PROGRESS NOTE    Natasha Harper  FMW:[id number] DOB: 1934/07/31 DOA: 06/16/2024 PCP: Charlett Apolinar POUR, MD  Brief Narrative:  This 89 yrs old female with PMH significant for hypertension, hyperlipidemia, CAD, hypothyroidism, left hip fracture s/p ORIF presents with a fall resulting in fractures of the left hip and tailbone.  X-ray imaging revealed acute impacted intertrochanteric proximal right femoral fracture, suspected nondisplaced fracture of the S4 and S5 junction, and healed chronic fracture deformity of the bilateral pubic rami.  Dr. Kendal of orthopedics was consulted and recommended medical admission. Patient is status post ORIF.  POD #1.  PT/OT recommended SNF.  Assessment & Plan:   Principal Problem:   Femoral fracture (HCC) Active Problems:   Fall at home, initial encounter   CAD (coronary artery disease)   Hyperlipidemia   Macrocytic anemia   Hypothyroidism   Decreased hearing  Intertrochanteric proximal right femoral and sacral fracture secondary to fall: Patient presented status post mechanical fall at home with right hip pain. Reportedly lost her balance.  Denied any loss of consciousness.  X-rays revealed acute intertrochanter right femur fracture and suspected nondisplaced sacral fracture.   Orthopedics was consulted.  She underwent ORIF.  POD # 4. Continue Hydrocodone  / morphine  IV as needed for mild to moderate pain respectively. WBAT RLE, unrestricted ROM. Continue Lovenox  while inpatient can switch to Eliquis  upon DC. Follow-up with Dr. Kendal in 2 weeks. PT and OT recommended SNF.. SNF placement require P2P which was completed. Husband is having urological procedure today. He wants her to be discharged tomorrow.   CAD: Hyperlipidemia: Continue aspirin  and atorvastatin .   Macrocytic anemia Hemoglobin 10.9 with MCV 108.4 and MCH 35.3. Vitamin D  and vitamin B12 within normal range. Hb dropped from 8.0 ->7.3 likely postoperative. Transfuse 1 unit PRBC.   Hb improved to 8.3 >8.1   Hypothyroidism: Continue levothyroxine .   Hard of hearing: Continue supportive care.  DVT prophylaxis: Lovenox  Code Status: Full code Family Communication: Spouse at bedside. Disposition Plan:   Status is: Inpatient Remains inpatient appropriate because: Admitted status post mechanical fall with right femur fracture status post ORIF.  POD # 4.  PT and OT recommended SNF.  Medically ready for discharge,  Husband is having urological procedure today. He wants her to be discharged tomorrow.  Consultants:  Orthopedics  Procedures:ORIF POD # 4  Antimicrobials:  Anti-infectives (From admission, onward)    Start     Dose/Rate Route Frequency Ordered Stop   06/16/24 2000  ceFAZolin  (ANCEF ) IVPB 2g/100 mL premix        2 g 200 mL/hr over 30 Minutes Intravenous Every 8 hours 06/16/24 1551 06/17/24 1150   06/16/24 1057  ceFAZolin  (ANCEF ) 2-4 GM/100ML-% IVPB       Note to Pharmacy: Edsel Leita HERO: cabinet override      06/16/24 1057 06/16/24 2050      Subjective: Patient was seen and examined at bedside. Overnight events noted. Patient reports feeling better, She reports having RLE pain but reasonably controlled.   Status post ORIF POD # 5.  She was comfortably sitting in the recliner.  Objective: Vitals:   06/20/24 1609 06/20/24 2028 06/21/24 0341 06/21/24 0728  BP: 105/82 (!) 147/63 (!) 148/75 (!) 200/86  Pulse: 91 93 92 (!) 103  Resp: 19 18 18 16   Temp: 98 F (36.7 C) (!) 97.5 F (36.4 C) 98.2 F (36.8 C) 99 F (37.2 C)  TempSrc:      SpO2: 94% 95% 94% 95%  Weight:  Height:        Intake/Output Summary (Last 24 hours) at 06/21/2024 1308 Last data filed at 06/21/2024 0700 Gross per 24 hour  Intake 120 ml  Output 250 ml  Net -130 ml   Filed Weights   06/16/24 0253 06/16/24 1112  Weight: 54.4 kg 54.4 kg    Examination:  General exam: Appears calm and comfortable.Not in any acute distress. Respiratory system: CTA Bilaterally.  Respiratory effort normal. RR 14 Cardiovascular system: S1 & S2 heard, RRR. No JVD, murmurs, rubs, gallops or clicks.  Gastrointestinal system: Abdomen is non distended, soft and non tender. Normal bowel sounds heard. Central nervous system: Alert and oriented x 3. No focal neurological deficits. Extremities: status post ORIF POD # 4. Skin: No rashes, lesions or ulcers Psychiatry: Judgement and insight appear normal. Mood & affect appropriate.     Data Reviewed: I have personally reviewed following labs and imaging studies  CBC: Recent Labs  Lab 06/16/24 0502 06/17/24 0433 06/18/24 0250 06/19/24 0343 06/20/24 0609  WBC 7.6 6.4 6.5 5.7  --   NEUTROABS 6.7  --   --   --   --   HGB 10.9* 8.0* 7.3* 8.3* 8.1*  HCT 33.5* 23.5* 21.9* 24.7* 24.4*  MCV 108.4* 104.4* 105.3* 100.0  --   PLT 246 182 172 162  --    Basic Metabolic Panel: Recent Labs  Lab 06/16/24 0502 06/17/24 0433  NA 136 134*  K 4.1 4.5  CL 103 105  CO2 24 24  GLUCOSE 126* 128*  BUN 18 29*  CREATININE 0.42* 0.65  CALCIUM  8.5* 8.3*   GFR: Estimated Creatinine Clearance: 39.2 mL/min (by C-G formula based on SCr of 0.65 mg/dL). Liver Function Tests: No results for input(s): AST, ALT, ALKPHOS, BILITOT, PROT, ALBUMIN in the last 168 hours. No results for input(s): LIPASE, AMYLASE in the last 168 hours. No results for input(s): AMMONIA in the last 168 hours. Coagulation Profile: No results for input(s): INR, PROTIME in the last 168 hours. Cardiac Enzymes: No results for input(s): CKTOTAL, CKMB, CKMBINDEX, TROPONINI in the last 168 hours. BNP (last 3 results) No results for input(s): PROBNP in the last 8760 hours. HbA1C: No results for input(s): HGBA1C in the last 72 hours. CBG: No results for input(s): GLUCAP in the last 168 hours. Lipid Profile: No results for input(s): CHOL, HDL, LDLCALC, TRIG, CHOLHDL, LDLDIRECT in the last 72 hours. Thyroid  Function  Tests: No results for input(s): TSH, T4TOTAL, FREET4, T3FREE, THYROIDAB in the last 72 hours. Anemia Panel: No results for input(s): VITAMINB12, FOLATE, FERRITIN, TIBC, IRON, RETICCTPCT in the last 72 hours.  Sepsis Labs: No results for input(s): PROCALCITON, LATICACIDVEN in the last 168 hours.  No results found for this or any previous visit (from the past 240 hours).   Radiology Studies: No results found.  Scheduled Meds:  amLODipine   5 mg Oral Daily   aspirin  EC  81 mg Oral Daily   atorvastatin   80 mg Oral Daily   docusate sodium   100 mg Oral BID   enoxaparin  (LOVENOX ) injection  40 mg Subcutaneous Q24H   hydrALAZINE   25 mg Oral Q8H   levothyroxine   50 mcg Oral Q0600   Continuous Infusions:     LOS: 5 days    Time spent: 35 Mins    Darcel Dawley, MD [facility]   If 7PM-7AM, please contact night-coverage

## 2024-06-22 LAB — CBC
HCT: 25.7 % — ABNORMAL LOW (ref 36.0–46.0)
Hemoglobin: 8.4 g/dL — ABNORMAL LOW (ref 12.0–15.0)
MCH: 33.6 pg (ref 26.0–34.0)
MCHC: 32.7 g/dL (ref 30.0–36.0)
MCV: 102.8 fL — ABNORMAL HIGH (ref 80.0–100.0)
Platelets: 274 K/uL (ref 150–400)
RBC: 2.5 MIL/uL — ABNORMAL LOW (ref 3.87–5.11)
RDW: 16.4 % — ABNORMAL HIGH (ref 11.5–15.5)
WBC: 5.2 K/uL (ref 4.0–10.5)
nRBC: 0 % (ref 0.0–0.2)

## 2024-06-22 LAB — MAGNESIUM: Magnesium: 2 mg/dL (ref 1.7–2.4)

## 2024-06-22 LAB — BASIC METABOLIC PANEL WITH GFR
Anion gap: 9 (ref 5–15)
BUN: 22 mg/dL (ref 8–23)
CO2: 22 mmol/L (ref 22–32)
Calcium: 8.5 mg/dL — ABNORMAL LOW (ref 8.9–10.3)
Chloride: 104 mmol/L (ref 98–111)
Creatinine, Ser: 0.39 mg/dL — ABNORMAL LOW (ref 0.44–1.00)
GFR, Estimated: >60 mL/min (ref >60)
Glucose, Bld: 100 mg/dL — ABNORMAL HIGH (ref 70–99)
Potassium: 4.2 mmol/L (ref 3.5–5.1)
Sodium: 136 mmol/L (ref 135–145)

## 2024-06-22 LAB — PHOSPHORUS: Phosphorus: 2.8 mg/dL (ref 2.5–4.6)

## 2024-06-22 MED ORDER — HYDRALAZINE HCL 25 MG PO TABS: 25.0000 mg | ORAL_TABLET | Freq: Three times a day (TID) | ORAL | 0 refills | Status: DC

## 2024-06-22 MED ORDER — AMLODIPINE BESYLATE 5 MG PO TABS: 5.0000 mg | ORAL_TABLET | Freq: Every day | ORAL | 0 refills | Status: DC

## 2024-06-22 MED ORDER — POLYETHYLENE GLYCOL 3350 17 G PO PACK: 17.0000 g | PACK | Freq: Every day | ORAL | 0 refills | Status: DC | PRN

## 2024-06-22 MED ORDER — DOCUSATE SODIUM 100 MG PO CAPS: 100.0000 mg | ORAL_CAPSULE | Freq: Two times a day (BID) | ORAL | 0 refills | Status: DC

## 2024-06-22 NOTE — TOC Transition Note (Addendum)
 Transition of Care [facility]) - Discharge Note   Patient Details  Name: Natasha Harper MRN: [id number] Date of Birth: 1935/02/08  [facility] CM/SW Contact:  Bridget Cordella Simmonds, LCSW Phone Number: 06/22/2024, 12:13 PM   Clinical Narrative:   Pt discharging to [facility]. RN call report to [phone].   PTAR called [phone].  Will pick pt up from DC lounge no earlier than 1pm.   0830: CSW confirmed with [facility]: they can receive pt today.   Final next level of care: Skilled Nursing Facility Barriers to Discharge: Barriers Resolved   Patient Goals and CMS Choice   CMS Medicare.gov Compare Post Acute Care list provided to:: Patient Represenative (must comment) (spouse) Choice offered to / list presented to : Spouse [place] ownership interest in [facility].provided to:: Spouse    Discharge Placement              Patient [facility] bed at: [facility] Patient to be transferred to facility by: ptar Name of family member notified: husband Maude in room Patient and family notified of of transfer: 06/22/24  Discharge Plan and Services Additional resources added to the After Visit Summary for       Post Acute Care Choice: Skilled Nursing Facility                               Social Drivers of Health (SDOH) Interventions SDOH Screenings   Food Insecurity: No Food Insecurity (06/16/2024)  Housing: Low Risk (06/16/2024)  Transportation Needs: No Transportation Needs (06/16/2024)  Utilities: Patient Declined (06/16/2024)  Depression (PHQ2-9): Low Risk (01/28/2022)  Social Connections: Socially Isolated (06/16/2024)  Tobacco Use: Medium Risk (06/16/2024)     Readmission Risk Interventions     No data to display

## 2024-06-22 NOTE — Progress Notes (Signed)
 Report given to [facility].

## 2024-06-22 NOTE — Plan of Care (Signed)

## 2024-06-22 NOTE — Discharge Summary (Signed)
 Physician Discharge Summary  Natasha Harper FMW:[id number] DOB: 1935/01/11 DOA: 06/16/2024  PCP: Charlett Apolinar POUR, MD  Admit date: 06/16/2024  Discharge date: 06/22/2024  Admitted From: Home  Disposition:  SNF  Recommendations for Outpatient Follow-up:  Follow up with PCP in 1-2 weeks. Please obtain BMP/CBC in one week. Advised to follow-up with orthopedics as scheduled. Advised to resume aspirin  once DVT prophylaxis with Eliquis  is complete. Advised to continue Norvasc  and hydralazine  for blood pressure control. Will request PCP to adjust blood pressure medications according to the blood pressure.  Home Health: None Equipment/Devices: None  Discharge Condition: Stable CODE STATUS:Full code Diet recommendation: Heart Healthy   Brief [name] Course: This 89 yrs old female with PMH significant for hypertension, hyperlipidemia, CAD, hypothyroidism, left hip fracture s/p ORIF presents with a fall resulting in fractures of the left hip and tailbone.  X-ray imaging revealed acute impacted intertrochanteric proximal right femoral fracture, suspected nondisplaced fracture of the S4 and S5 junction, and healed chronic fracture deformity of the bilateral pubic rami.  Dr. Kendal of orthopedics was consulted and recommended medical admission. Patient is status post ORIF.  POD # 4.  Patient tolerated surgical procedure well. PT/OT recommended SNF.  Patient's blood pressure was slightly elevated so started on Norvasc  and hydralazine  for blood pressure control.  Will request PCP to adjust blood pressure medications according to the blood pressure.  Patient started on Eliquis  for postoperative DVT prophylaxis.  Advised to resume aspirin  once DVT prophylaxis Eliquis  is complete.  Insurance authorization approved.  Patient being discharged to SNF.   Discharge Diagnoses:  Principal Problem:   Femoral fracture (HCC) Active Problems:   Fall at home, initial encounter   CAD (coronary artery  disease)   Hyperlipidemia   Macrocytic anemia   Hypothyroidism   Decreased hearing  Intertrochanteric proximal right femoral and sacral fracture secondary to fall: Patient presented status post mechanical fall at home with right hip pain. Reportedly lost her balance.  Denied any loss of consciousness.  X-rays revealed acute intertrochanter right femur fracture and suspected nondisplaced sacral fracture.   Orthopedics was consulted.  She underwent ORIF.  POD # 4. Continue Hydrocodone  / morphine  IV as needed for mild to moderate pain respectively. WBAT RLE, unrestricted ROM. Continue Lovenox  while inpatient can switch to Eliquis  upon DC. Follow-up with Dr. Kendal in 2 weeks. PT and OT recommended SNF.SABRA Patient is being discharged to SNF.   CAD: Hyperlipidemia: Continue aspirin  and atorvastatin .   Macrocytic anemia Hemoglobin 10.9 with MCV 108.4 and MCH 35.3. Vitamin D  and vitamin B12 within normal range. Hb dropped from 8.0 ->7.3 likely postoperative. Transfuse 1 unit PRBC.  Hb improved to 8.3 >8.1   Hypothyroidism: Continue levothyroxine .   Hard of hearing: Continue supportive care.  Discharge Instructions  Discharge Instructions     Call MD for:  persistant dizziness or light-headedness   Complete by: As directed    Call MD for:  persistant nausea and vomiting   Complete by: As directed    Diet general   Complete by: As directed    Discharge instructions   Complete by: As directed    Advised to follow-up with primary care physician in 1 week. Advised to follow-up with orthopedics as scheduled. Advised to resume once aspirin  once DVT prophylaxis with Eliquis  is complete.   Discharge wound care:   Complete by: As directed    Follow-up orthopedics as scheduled.   Increase activity slowly   Complete by: As directed  Allergies as of 06/22/2024       Reactions   Actonel [risedronate Sodium] Other (See Comments)   Bloating   Evista [raloxifene] Other (See  Comments)   Abdominal cramps   Fosamax [alendronate Sodium] Other (See Comments)   Abdominal cramps        Medication List     PAUSE taking these medications    aspirin  EC 81 MG tablet Wait to take this until: July 22, 2024 Take 81 mg by mouth daily. Swallow whole.       TAKE these medications    acetaminophen  325 MG tablet Commonly known as: TYLENOL  Take 1-2 tablets (325-650 mg total) by mouth every 6 (six) hours as needed for mild pain (pain score 1-3), fever, moderate pain (pain score 4-6) or headache (or temp > 100.5).   amLODipine  5 MG tablet Commonly known as: NORVASC  Take 1 tablet (5 mg total) by mouth daily. Start taking on: June 23, 2024   apixaban  2.5 MG Tabs tablet Commonly known as: Eliquis  Take 1 tablet (2.5 mg total) by mouth 2 (two) times daily.   atorvastatin  80 MG tablet Commonly known as: LIPITOR  TAKE 1 TABLET BY MOUTH DAILY What changed:  how much to take how to take this when to take this   docusate sodium  100 MG capsule Commonly known as: COLACE Take 1 capsule (100 mg total) by mouth 2 (two) times daily.   hydrALAZINE  25 MG tablet Commonly known as: APRESOLINE  Take 1 tablet (25 mg total) by mouth every 8 (eight) hours.   HYDROcodone -acetaminophen  5-325 MG tablet Commonly known as: NORCO/VICODIN Take 1-2 tablets by mouth every 6 (six) hours as needed for moderate pain (pain score 4-6) or severe pain (pain score 7-10).   methocarbamol  500 MG tablet Commonly known as: ROBAXIN  Take 1 tablet (500 mg total) by mouth every 8 (eight) hours as needed for muscle spasms.   polyethylene glycol 17 g packet Commonly known as: MIRALAX  / GLYCOLAX  Take 17 g by mouth daily as needed for mild constipation.   Synthroid  50 MCG tablet Generic drug: levothyroxine  TAKE 1 TABLET BY MOUTH DAILY What changed:  how much to take how to take this when to take this               Discharge Care Instructions  (From admission, onward)            Start     Ordered   06/22/24 0000  Discharge wound care:       Comments: Follow-up orthopedics as scheduled.   06/22/24 1040            Contact information for follow-up providers     Haddix, Franky SQUIBB, MD. Schedule an appointment as soon as possible for a visit in 2 week(s).   Specialty: Orthopedic Surgery Why: For wound check and repeat x-rays Contact information: [address] [place] [address] [phone]         Charlett Apolinar POUR, MD Follow up in 1 week(s).   Specialties: Internal Medicine, Pediatrics Contact information: [address] [place] [address] [phone]              Contact information for after-discharge care     Destination     [facility], [place] .   Service: Skilled Nursing Contact information: [address] [place]  [address] [phone]                    Allergies[1]  Consultations:  Orthopedics   Procedures/Studies: DG HIP PORT UNILAT W OR W/O PELVIS 1V RIGHT Result Date: 06/16/2024 EXAM: 1 VIEW(S) XRAY OF THE RIGHT HIP 06/16/2024 01:52:00 PM COMPARISON: Comparison 09/10/2021. CLINICAL HISTORY: 03948 Fracture [id number] Fracture [id number]. FINDINGS: BONES AND JOINTS: Interval surgical internal fixation of proximal right femoral intracranial fracture. No malalignment. SOFT TISSUES: Expected postoperative changes seen in the surrounding soft tissues. IMPRESSION: 1. Interval surgical internal fixation of a proximal right femoral fracture with expected postoperative changes in the surrounding soft tissues. Electronically signed by: Lynwood Seip MD 06/16/2024 06:11 PM EST RP Workstation: [id number]   DG FEMUR, MIN 2 VIEWS RIGHT Result Date: 06/16/2024 EXAM: 2 VIEW(S) XRAY OF THE RIGHT FEMUR 06/16/2024 12:52:00 PM COMPARISON: Same day radiation exposure index 7.71 mGy. 6 intraoperative fluoroscopic images of the right hip, which demonstrated surgical interim fixation of a  proximal right femoral intertrochanteric fracture. CLINICAL HISTORY: 461500 Elective surgery 461500 Elective surgery 461500 FINDINGS: BONES AND JOINTS: Surgical interim fixation of a proximal right femoral intertrochanteric fracture is demonstrated. SOFT TISSUES: The soft tissues are unremarkable. IMPRESSION: 1. Surgical internal fixation of proximal right femoral intertrochanteric fracture. Electronically signed by: Lynwood Seip MD 06/16/2024 06:09 PM EST RP Workstation: [id number]   DG C-Arm 1-60 Min-No Report Result Date: 06/16/2024 Fluoroscopy was utilized by the requesting physician.  No radiographic interpretation.   CT PELVIS WO CONTRAST Result Date: 06/16/2024 EXAM: CT Pelvis without IV contrast CLINICAL HISTORY: Hip trauma, fracture suspected, xray done. TECHNIQUE: Axial images were acquired through the pelvis without IV contrast. Reformatted images were reviewed. Dose reduction technique was used including one or more of the following: automated exposure control, adjustment of mA and kV according to patient size, and/or iterative reconstruction. CONTRAST: Without COMPARISON: Right femur and hip films from earlier today. Chest, abdomen, and pelvis CT 10/05/2023. FINDINGS: BONES: Generalized osteopenia. Acute intertrochanteric impacted proximal right femoral fracture with medial migration of the lesser trochanter, cephalad migration of the main distal fracture fragment with the right femoral neck and shaft almost at right angles, and a slight valgus angulation of the main distal fragment. Old left hip nailing hardware in place without evidence of further acute fractures. Interval healing of bilateral pubic rami fractures, which were acute on the prior CT. No displaced sacrococcygeal fracture is seen. There is chronic wedging in the lumbar spine at L4 and L5. The superimposed degenerative change of the lower lumbar facets with hypertrophic arthropathy. JOINTS: No dislocation. Mild symmetric  degenerative arthrosis at both hips. The joint spaces are otherwise normal. SOFT TISSUES: Chronic abdominal wall laxity and protrusion anteriorly and to the right greater than left with chronic inguinal fat hernias. Trace ascites noted in the posterior deep pelvis but were not seen on the prior study. There is no space-occupying hematoma in the upper right thigh. There is no pelvic mass, free hemorrhage, or free air. VASCULATURE: Aortoiliac atherosclerosis. IMPRESSION: 1. Acute intertrochanteric impacted proximal right femoral fracture with medial migration of the lesser trochanter, cephalad migration of the main distal fracture fragment with the right femoral neck and shaft almost at right angles, and a slight valgus angulation of the main distal fragment. 2. Interval healing of bilateral pubic rami fractures, which were acute on the prior CT. 3. Mild symmetric degenerative arthrosis at both hips with old left hip nailing hardware in place without evidence of further acute fractures. Electronically signed by: Francis Quam MD 06/16/2024 08:00 AM EST RP Workstation: [id number]   DG Pelvis Portable Result Date: 06/16/2024 EXAM: 1 or 2 VIEW(S) XRAY OF  THE PELVIS 06/16/2024 03:58:00 AM COMPARISON: CT chest abdomen pelvis 10/05/2023. CLINICAL HISTORY: R hip pain post mechanical fall. FINDINGS: BONES: Left hip ORIF hardware in place. Acute displaced and impacted intertrochanteric fracture of the right hip. Proximal migration of the femoral shaft. Displacement of the lesser trochanter. Chronic fracture deformity of the bilateral pubic rami. Diffuse osteopenia. JOINTS: Degenerative changes of the visualized lower lumbar spine. There is mild symmetric arthrosis of the hip joints. SOFT TISSUES: The soft tissues are unremarkable. IMPRESSION: 1. Acute displaced and impacted intertrochanteric fracture of the right hip with proximal migration of the femoral shaft and displacement of the lesser trochanter. 2. Left hip ORIF  hardware in place. 3. Chronic fracture deformity of the bilateral pubic rami. 4. Diffuse osteopenia. Electronically signed by: Francis Quam MD 06/16/2024 04:52 AM EST RP Workstation: [id number]   DG Sacrum/Coccyx Result Date: 06/16/2024 EXAM: 1 LATERAL VIEW XRAY OF THE SACRUM AND COCCYX 06/16/2024 03:58:00 AM COMPARISON: Chest abdomen and pelvis CT 10/05/2023. CLINICAL HISTORY: Fall with coccyx pain, R hip pain. FINDINGS: BONES AND JOINTS: On the lateral view, the coccyx is largely obscured by what appears to be an underlying backboard. There is diffuse osteopenia. There is a subtle cortical depression at the junction of S4 and S5 on the lateral view not clearly demonstrated on CT, which could be a nondisplaced fracture. There is no evidence of displaced fractures. There are chronic healed fracture deformities of the bilateral pubic rami, which were previously acute. There is no visible step off in the sacral arcuate lines. No malalignment. SOFT TISSUES: The soft tissues are unremarkable. IMPRESSION: 1. Subtle cortical depression at the junction of S4 and S5 on the lateral view, possibly representing a nondisplaced fracture. 2. No other fractures identified. Diffuse osteopenia. 3. Chronic healed fracture deformities of the bilateral pubic rami, previously acute. Electronically signed by: Francis Quam MD 06/16/2024 04:14 AM EST RP Workstation: [id number]   DG Femur Min 2 Views Right Result Date: 06/16/2024 EXAM: 2 VIEW(S) XRAY OF THE RIGHT FEMUR 06/16/2024 03:56:00 AM COMPARISON: Chest abdomen pelvis CT 10/05/2023. CLINICAL HISTORY: R hip pain post mechanical fall. FINDINGS: BONES AND JOINTS: Diffuse osteopenia. Acute impacted intertrochanteric proximal right femoral fracture with cephalad displacement of the distal fragment such that the femoral neck and shaft are almost at right angles. The main distal fragment shows mild varus angulation. Mild degenerative arthrosis of the right hip and knee. The right  pelvic ring is grossly intact. SOFT TISSUES: Patchy calcifications in the right superficial femoral artery. Soft tissues otherwise unremarkable. IMPRESSION: 1. Acute impacted intertrochanteric proximal right femoral fracture with cephalad displacement of the distal fragment and mild varus angulation. 2. Diffuse osteopenia. Electronically signed by: Francis Quam MD 06/16/2024 04:05 AM EST RP Workstation: [id number]   Subjective: Patient was seen and examined at bedside.  Overnight events noted. She reports having pain when she moves otherwise doing much better.   She wants to be discharged to SNF.  Discharge Exam: Vitals:   06/22/24 0432 06/22/24 0724  BP: (!) 140/88 (!) 133/57  Pulse: 94 99  Resp: 16 16  Temp: 98.2 F (36.8 C)   SpO2: 96% 96%   Vitals:   06/21/24 1436 06/21/24 1952 06/22/24 0432 06/22/24 0724  BP: (!) 154/72 (!) 133/51 (!) 140/88 (!) 133/57  Pulse: 96 95 94 99  Resp: 16 16 16 16   Temp: 98.5 F (36.9 C) 98.2 F (36.8 C) 98.2 F (36.8 C)   TempSrc: Oral   Oral  SpO2: 96% 96% 96% 96%  Weight:      Height:        General: Pt is alert, awake, not in acute distress Cardiovascular: RRR, S1/S2 +, no rubs, no gallops Respiratory: CTA bilaterally, no wheezing, no rhonchi Abdominal: Soft, NT, ND, bowel sounds + Extremities: no edema, no cyanosis, RLE; S/P ORIF     The results of significant diagnostics from this hospitalization (including imaging, microbiology, ancillary and laboratory) are listed below for reference.     Microbiology: No results found for this or any previous visit (from the past 240 hours).   Labs: BNP (last 3 results) Recent Labs    10/05/23 1626  BNP 177.1*   Basic Metabolic Panel: Recent Labs  Lab 06/16/24 0502 06/17/24 0433 06/22/24 0524  NA 136 134* 136  K 4.1 4.5 4.2  CL 103 105 104  CO2 24 24 22   GLUCOSE 126* 128* 100*  BUN 18 29* 22  CREATININE 0.42* 0.65 0.39*  CALCIUM  8.5* 8.3* 8.5*  MG  --   --  2.0  PHOS  --    --  2.8   Liver Function Tests: No results for input(s): AST, ALT, ALKPHOS, BILITOT, PROT, ALBUMIN in the last 168 hours. No results for input(s): LIPASE, AMYLASE in the last 168 hours. No results for input(s): AMMONIA in the last 168 hours. CBC: Recent Labs  Lab 06/16/24 0502 06/17/24 0433 06/18/24 0250 06/19/24 0343 06/20/24 0609 06/22/24 0524  WBC 7.6 6.4 6.5 5.7  --  5.2  NEUTROABS 6.7  --   --   --   --   --   HGB 10.9* 8.0* 7.3* 8.3* 8.1* 8.4*  HCT 33.5* 23.5* 21.9* 24.7* 24.4* 25.7*  MCV 108.4* 104.4* 105.3* 100.0  --  102.8*  PLT 246 182 172 162  --  274   Cardiac Enzymes: No results for input(s): CKTOTAL, CKMB, CKMBINDEX, TROPONINI in the last 168 hours. BNP: Invalid input(s): POCBNP CBG: No results for input(s): GLUCAP in the last 168 hours. D-Dimer No results for input(s): DDIMER in the last 72 hours. Hgb A1c No results for input(s): HGBA1C in the last 72 hours. Lipid Profile No results for input(s): CHOL, HDL, LDLCALC, TRIG, CHOLHDL, LDLDIRECT in the last 72 hours. Thyroid  function studies No results for input(s): TSH, T4TOTAL, T3FREE, THYROIDAB in the last 72 hours.  Invalid input(s): FREET3 Anemia work up No results for input(s): VITAMINB12, FOLATE, FERRITIN, TIBC, IRON, RETICCTPCT in the last 72 hours. Urinalysis    Component Value Date/Time   COLORURINE YELLOW 10/07/2023 1236   APPEARANCEUR CLEAR 10/07/2023 1236   LABSPEC 1.025 10/07/2023 1236   PHURINE 5.0 10/07/2023 1236   GLUCOSEU NEGATIVE 10/07/2023 1236   HGBUR NEGATIVE 10/07/2023 1236   HGBUR negative 07/19/2007 0851   BILIRUBINUR NEGATIVE 10/07/2023 1236   BILIRUBINUR n 03/18/2016 1328   KETONESUR 20 (A) 10/07/2023 1236   PROTEINUR 30 (A) 10/07/2023 1236   UROBILINOGEN negative 03/18/2016 1328   UROBILINOGEN 0.2 07/19/2007 0851   NITRITE NEGATIVE 10/07/2023 1236   LEUKOCYTESUR NEGATIVE 10/07/2023 1236   Sepsis  Labs Recent Labs  Lab 06/17/24 0433 06/18/24 0250 06/19/24 0343 06/22/24 0524  WBC 6.4 6.5 5.7 5.2   Microbiology No results found for this or any previous visit (from the past 240 hours).   Time coordinating discharge: Over 30 minutes  SIGNED:   Darcel Dawley, MD  [facility] 06/22/2024, 11:44 AM Pager   If 7PM-7AM, please contact night-coverage     [1]  Allergies Allergen Reactions   Actonel [Risedronate Sodium]  Other (See Comments)    Bloating   Evista [Raloxifene] Other (See Comments)    Abdominal cramps   Fosamax [Alendronate Sodium] Other (See Comments)    Abdominal cramps

## 2024-06-24 ENCOUNTER — Telehealth: Payer: Self-pay | Admitting: Internal Medicine

## 2024-06-24 NOTE — Telephone Encounter (Signed)
 [facility] orders dropped off by Sharyne Ada- [facility] employee- placed in provider's folder.  Please call Sharyne Ada at [phone] on Tuesday- she is requesting to know when the paperwork can be filled out - she states she needs the paperwork by Tuesday afternoon 06/28/24.    Sharyne Ada was made aware that our policy was 5-7 business days so it may not be by 06/28/24 as she is requesting.  Sharyne is requesting the paperwork by faxed to the [facility] fax on the front sheet once complete.

## 2024-07-04 NOTE — Telephone Encounter (Signed)
 Contacted Alice last week. Explain to her that Dr. Charlett has not seen pt since 2023. Trying to have pt seen, spouse states pt is not able to come in. Alice advise to fax the form back to [facility] with explanation above. Form faxed.

## 2024-07-17 ENCOUNTER — Encounter (HOSPITAL_COMMUNITY): Payer: Self-pay | Admitting: Emergency Medicine

## 2024-07-17 ENCOUNTER — Emergency Department (HOSPITAL_COMMUNITY)

## 2024-07-17 ENCOUNTER — Other Ambulatory Visit: Payer: Self-pay

## 2024-07-17 ENCOUNTER — Inpatient Hospital Stay (HOSPITAL_COMMUNITY)
Admission: EM | Admit: 2024-07-17 | Discharge: 2024-08-07 | DRG: 871 | Disposition: E | Attending: Internal Medicine | Admitting: Internal Medicine

## 2024-07-17 DIAGNOSIS — Z803 Family history of malignant neoplasm of breast: Secondary | ICD-10-CM

## 2024-07-17 DIAGNOSIS — R0602 Shortness of breath: Secondary | ICD-10-CM | POA: Diagnosis not present

## 2024-07-17 DIAGNOSIS — Z8262 Family history of osteoporosis: Secondary | ICD-10-CM

## 2024-07-17 DIAGNOSIS — D509 Iron deficiency anemia, unspecified: Secondary | ICD-10-CM | POA: Diagnosis present

## 2024-07-17 DIAGNOSIS — Z87891 Personal history of nicotine dependence: Secondary | ICD-10-CM | POA: Diagnosis not present

## 2024-07-17 DIAGNOSIS — N179 Acute kidney failure, unspecified: Secondary | ICD-10-CM | POA: Diagnosis present

## 2024-07-17 DIAGNOSIS — I5043 Acute on chronic combined systolic (congestive) and diastolic (congestive) heart failure: Secondary | ICD-10-CM | POA: Diagnosis present

## 2024-07-17 DIAGNOSIS — E039 Hypothyroidism, unspecified: Secondary | ICD-10-CM | POA: Diagnosis present

## 2024-07-17 DIAGNOSIS — D638 Anemia in other chronic diseases classified elsewhere: Secondary | ICD-10-CM | POA: Diagnosis present

## 2024-07-17 DIAGNOSIS — Z66 Do not resuscitate: Secondary | ICD-10-CM | POA: Diagnosis not present

## 2024-07-17 DIAGNOSIS — M4854XA Collapsed vertebra, not elsewhere classified, thoracic region, initial encounter for fracture: Secondary | ICD-10-CM | POA: Diagnosis present

## 2024-07-17 DIAGNOSIS — I4891 Unspecified atrial fibrillation: Secondary | ICD-10-CM | POA: Diagnosis present

## 2024-07-17 DIAGNOSIS — Z79899 Other long term (current) drug therapy: Secondary | ICD-10-CM

## 2024-07-17 DIAGNOSIS — I2510 Atherosclerotic heart disease of native coronary artery without angina pectoris: Secondary | ICD-10-CM | POA: Diagnosis present

## 2024-07-17 DIAGNOSIS — E7800 Pure hypercholesterolemia, unspecified: Secondary | ICD-10-CM | POA: Diagnosis present

## 2024-07-17 DIAGNOSIS — R0603 Acute respiratory distress: Secondary | ICD-10-CM | POA: Diagnosis present

## 2024-07-17 DIAGNOSIS — Z7901 Long term (current) use of anticoagulants: Secondary | ICD-10-CM

## 2024-07-17 DIAGNOSIS — S7290XA Unspecified fracture of unspecified femur, initial encounter for closed fracture: Secondary | ICD-10-CM | POA: Diagnosis present

## 2024-07-17 DIAGNOSIS — Z604 Social exclusion and rejection: Secondary | ICD-10-CM | POA: Diagnosis present

## 2024-07-17 DIAGNOSIS — F32A Depression, unspecified: Secondary | ICD-10-CM | POA: Diagnosis present

## 2024-07-17 DIAGNOSIS — A4189 Other specified sepsis: Secondary | ICD-10-CM | POA: Diagnosis present

## 2024-07-17 DIAGNOSIS — Z7982 Long term (current) use of aspirin: Secondary | ICD-10-CM

## 2024-07-17 DIAGNOSIS — I214 Non-ST elevation (NSTEMI) myocardial infarction: Secondary | ICD-10-CM

## 2024-07-17 DIAGNOSIS — I5020 Unspecified systolic (congestive) heart failure: Secondary | ICD-10-CM | POA: Diagnosis not present

## 2024-07-17 DIAGNOSIS — J1282 Pneumonia due to coronavirus disease 2019: Secondary | ICD-10-CM | POA: Diagnosis present

## 2024-07-17 DIAGNOSIS — J9601 Acute respiratory failure with hypoxia: Secondary | ICD-10-CM | POA: Diagnosis present

## 2024-07-17 DIAGNOSIS — U071 COVID-19: Secondary | ICD-10-CM | POA: Diagnosis present

## 2024-07-17 DIAGNOSIS — Z79890 Hormone replacement therapy (postmenopausal): Secondary | ICD-10-CM | POA: Diagnosis not present

## 2024-07-17 DIAGNOSIS — Z8679 Personal history of other diseases of the circulatory system: Secondary | ICD-10-CM | POA: Insufficient documentation

## 2024-07-17 DIAGNOSIS — R7989 Other specified abnormal findings of blood chemistry: Secondary | ICD-10-CM | POA: Diagnosis present

## 2024-07-17 DIAGNOSIS — M810 Age-related osteoporosis without current pathological fracture: Secondary | ICD-10-CM | POA: Diagnosis present

## 2024-07-17 DIAGNOSIS — A419 Sepsis, unspecified organism: Secondary | ICD-10-CM | POA: Diagnosis present

## 2024-07-17 DIAGNOSIS — I5030 Unspecified diastolic (congestive) heart failure: Secondary | ICD-10-CM | POA: Diagnosis present

## 2024-07-17 DIAGNOSIS — I509 Heart failure, unspecified: Secondary | ICD-10-CM

## 2024-07-17 DIAGNOSIS — D539 Nutritional anemia, unspecified: Secondary | ICD-10-CM | POA: Diagnosis present

## 2024-07-17 DIAGNOSIS — Z8249 Family history of ischemic heart disease and other diseases of the circulatory system: Secondary | ICD-10-CM | POA: Diagnosis not present

## 2024-07-17 DIAGNOSIS — I10 Essential (primary) hypertension: Secondary | ICD-10-CM | POA: Diagnosis present

## 2024-07-17 DIAGNOSIS — Z515 Encounter for palliative care: Secondary | ICD-10-CM

## 2024-07-17 DIAGNOSIS — Z9861 Coronary angioplasty status: Secondary | ICD-10-CM

## 2024-07-17 DIAGNOSIS — E785 Hyperlipidemia, unspecified: Secondary | ICD-10-CM | POA: Diagnosis present

## 2024-07-17 DIAGNOSIS — Z7189 Other specified counseling: Secondary | ICD-10-CM | POA: Diagnosis not present

## 2024-07-17 DIAGNOSIS — I110 Hypertensive heart disease with heart failure: Secondary | ICD-10-CM | POA: Diagnosis present

## 2024-07-17 DIAGNOSIS — S22000A Wedge compression fracture of unspecified thoracic vertebra, initial encounter for closed fracture: Secondary | ICD-10-CM | POA: Diagnosis present

## 2024-07-17 DIAGNOSIS — R0902 Hypoxemia: Principal | ICD-10-CM

## 2024-07-17 LAB — I-STAT VENOUS BLOOD GAS, ED
Acid-base deficit: 4 mmol/L — ABNORMAL HIGH (ref 0.0–2.0)
Bicarbonate: 22.6 mmol/L (ref 20.0–28.0)
Calcium, Ion: 1.22 mmol/L (ref 1.15–1.40)
HCT: 38 % (ref 36.0–46.0)
Hemoglobin: 12.9 g/dL (ref 12.0–15.0)
O2 Saturation: 80 %
Potassium: 4.6 mmol/L (ref 3.5–5.1)
Sodium: 138 mmol/L (ref 135–145)
TCO2: 24 mmol/L (ref 22–32)
pCO2, Ven: 44.6 mmHg (ref 44–60)
pH, Ven: 7.313 (ref 7.25–7.43)
pO2, Ven: 48 mmHg — ABNORMAL HIGH (ref 32–45)

## 2024-07-17 LAB — TROPONIN T, HIGH SENSITIVITY
Troponin T High Sensitivity: 622 ng/L (ref 0–19)
Troponin T High Sensitivity: 558 ng/L (ref 0–19)
Troponin T High Sensitivity: 649 ng/L (ref 0–19)
Troponin T High Sensitivity: 660 ng/L (ref 0–19)

## 2024-07-17 LAB — CBC WITH DIFFERENTIAL/PLATELET
Basophils Absolute: 0 K/uL (ref 0.0–0.1)
Basophils Relative: 0 %
Eosinophils Absolute: 0 K/uL (ref 0.0–0.5)
Eosinophils Relative: 0 %
HCT: 27 % — ABNORMAL LOW (ref 36.0–46.0)
Hemoglobin: 8.7 g/dL — ABNORMAL LOW (ref 12.0–15.0)
Lymphocytes Relative: 3 %
Lymphs Abs: 0.2 K/uL — ABNORMAL LOW (ref 0.7–4.0)
MCH: 35.5 pg — ABNORMAL HIGH (ref 26.0–34.0)
MCHC: 32.2 g/dL (ref 30.0–36.0)
MCV: 110.2 fL — ABNORMAL HIGH (ref 80.0–100.0)
Monocytes Absolute: 0.1 K/uL (ref 0.1–1.0)
Monocytes Relative: 2 %
Neutro Abs: 6.7 K/uL (ref 1.7–7.7)
Neutrophils Relative %: 95 %
Platelets: 270 K/uL (ref 150–400)
RBC: 2.45 MIL/uL — ABNORMAL LOW (ref 3.87–5.11)
RDW: 18.9 % — ABNORMAL HIGH (ref 11.5–15.5)
WBC: 7 K/uL (ref 4.0–10.5)
nRBC: 0 % (ref 0.0–0.2)

## 2024-07-17 LAB — I-STAT CHEM 8, ED
BUN: 43 mg/dL — ABNORMAL HIGH (ref 8–23)
Calcium, Ion: 1.17 mmol/L (ref 1.15–1.40)
Chloride: 105 mmol/L (ref 98–111)
Creatinine, Ser: 0.9 mg/dL (ref 0.44–1.00)
Glucose, Bld: 123 mg/dL — ABNORMAL HIGH (ref 70–99)
HCT: 39 % (ref 36.0–46.0)
Hemoglobin: 13.3 g/dL (ref 12.0–15.0)
Potassium: 4.6 mmol/L (ref 3.5–5.1)
Sodium: 138 mmol/L (ref 135–145)
TCO2: 22 mmol/L (ref 22–32)

## 2024-07-17 LAB — RESP PANEL BY RT-PCR (RSV, FLU A&B, COVID)  RVPGX2
Influenza A by PCR: NEGATIVE
Influenza B by PCR: NEGATIVE
Resp Syncytial Virus by PCR: NEGATIVE
SARS Coronavirus 2 by RT PCR: POSITIVE — AB

## 2024-07-17 LAB — COMPREHENSIVE METABOLIC PANEL WITH GFR
ALT: 27 U/L (ref 0–44)
AST: 137 U/L — ABNORMAL HIGH (ref 15–41)
Albumin: 3.5 g/dL (ref 3.5–5.0)
Alkaline Phosphatase: 136 U/L — ABNORMAL HIGH (ref 38–126)
Anion gap: 14 (ref 5–15)
BUN: 42 mg/dL — ABNORMAL HIGH (ref 8–23)
CO2: 21 mmol/L — ABNORMAL LOW (ref 22–32)
Calcium: 9.2 mg/dL (ref 8.9–10.3)
Chloride: 101 mmol/L (ref 98–111)
Creatinine, Ser: 0.83 mg/dL (ref 0.44–1.00)
GFR, Estimated: >60 mL/min
Glucose, Bld: 127 mg/dL — ABNORMAL HIGH (ref 70–99)
Potassium: 4.7 mmol/L (ref 3.5–5.1)
Sodium: 136 mmol/L (ref 135–145)
Total Bilirubin: 0.4 mg/dL (ref 0.0–1.2)
Total Protein: 6.6 g/dL (ref 6.5–8.1)

## 2024-07-17 LAB — HEPARIN LEVEL (UNFRACTIONATED): Heparin Unfractionated: >1.1 [IU]/mL — ABNORMAL HIGH (ref 0.30–0.70)

## 2024-07-17 LAB — PROTIME-INR
INR: 1.7 — ABNORMAL HIGH (ref 0.8–1.2)
Prothrombin Time: 20.4 s — ABNORMAL HIGH (ref 11.4–15.2)

## 2024-07-17 LAB — ECHOCARDIOGRAM COMPLETE
Area-P 1/2: 5.97 cm2
Calc EF: 39.2 %
S' Lateral: 3.6 cm
Single Plane A2C EF: 31.5 %
Single Plane A4C EF: 41.9 %

## 2024-07-17 LAB — PHOSPHORUS: Phosphorus: 3.8 mg/dL (ref 2.5–4.6)

## 2024-07-17 LAB — APTT: aPTT: 83 s — ABNORMAL HIGH (ref 24–36)

## 2024-07-17 LAB — MAGNESIUM: Magnesium: 2.1 mg/dL (ref 1.7–2.4)

## 2024-07-17 LAB — PRO BRAIN NATRIURETIC PEPTIDE: Pro Brain Natriuretic Peptide: 28563 pg/mL — ABNORMAL HIGH

## 2024-07-17 LAB — PROCALCITONIN: Procalcitonin: 0.3 ng/mL

## 2024-07-17 LAB — MRSA NEXT GEN BY PCR, NASAL: MRSA by PCR Next Gen: NOT DETECTED

## 2024-07-17 LAB — I-STAT CG4 LACTIC ACID, ED
Lactic Acid, Venous: 1.6 mmol/L (ref 0.5–1.9)
Lactic Acid, Venous: 1.9 mmol/L (ref 0.5–1.9)

## 2024-07-17 MED ORDER — FLEET ENEMA RE ENEM: 1.0000 | ENEMA | Freq: Once | RECTAL | Status: DC | PRN

## 2024-07-17 MED ORDER — LEVALBUTEROL HCL 1.25 MG/0.5ML IN NEBU
1.2500 mg | INHALATION_SOLUTION | Freq: Four times a day (QID) | RESPIRATORY_TRACT | Status: DC
  Administered 2024-07-17 – 2024-07-18 (×5): 1.25 mg via RESPIRATORY_TRACT
  Filled 2024-07-17 (×6): qty 0.5

## 2024-07-17 MED ORDER — ATORVASTATIN CALCIUM 80 MG PO TABS: 80.0000 mg | ORAL_TABLET | Freq: Every day | ORAL | Status: DC

## 2024-07-17 MED ORDER — ONDANSETRON HCL 4 MG/2ML IJ SOLN: 4.0000 mg | Freq: Four times a day (QID) | INTRAMUSCULAR | Status: DC | PRN

## 2024-07-17 MED ORDER — IPRATROPIUM BROMIDE 0.02 % IN SOLN
0.5000 mg | Freq: Four times a day (QID) | RESPIRATORY_TRACT | Status: DC
  Administered 2024-07-17 – 2024-07-18 (×5): 0.5 mg via RESPIRATORY_TRACT
  Filled 2024-07-17 (×5): qty 2.5

## 2024-07-17 MED ORDER — ZINC SULFATE 220 (50 ZN) MG PO CAPS: 220.0000 mg | ORAL_CAPSULE | Freq: Every day | ORAL | Status: DC

## 2024-07-17 MED ORDER — SODIUM CHLORIDE 0.9% FLUSH
3.0000 mL | Freq: Two times a day (BID) | INTRAVENOUS | Status: DC
  Administered 2024-07-18: 3 mL via INTRAVENOUS

## 2024-07-17 MED ORDER — ACETAMINOPHEN 650 MG RE SUPP: 650.0000 mg | Freq: Four times a day (QID) | RECTAL | Status: DC | PRN

## 2024-07-17 MED ORDER — AMLODIPINE BESYLATE 5 MG PO TABS: 5.0000 mg | ORAL_TABLET | Freq: Every day | ORAL | Status: DC

## 2024-07-17 MED ORDER — VANCOMYCIN HCL IN DEXTROSE 1-5 GM/200ML-% IV SOLN: 1000.0000 mg | Freq: Once | INTRAVENOUS | Status: DC

## 2024-07-17 MED ORDER — VITAMIN C 500 MG PO TABS: 500.0000 mg | ORAL_TABLET | Freq: Every day | ORAL | Status: DC

## 2024-07-17 MED ORDER — GUAIFENESIN 100 MG/5ML PO LIQD: 10.0000 mL | Freq: Three times a day (TID) | ORAL | Status: DC

## 2024-07-17 MED ORDER — ASPIRIN 81 MG PO CHEW: 324.0000 mg | CHEWABLE_TABLET | Freq: Once | ORAL | Status: DC

## 2024-07-17 MED ORDER — SODIUM CHLORIDE 0.9 % IV SOLN: 2.0000 g | Freq: Once | INTRAVENOUS | Status: DC

## 2024-07-17 MED ORDER — ONDANSETRON HCL 4 MG PO TABS: 4.0000 mg | ORAL_TABLET | Freq: Four times a day (QID) | ORAL | Status: DC | PRN

## 2024-07-17 MED ORDER — SODIUM CHLORIDE 0.9 % IV SOLN
2.0000 g | Freq: Two times a day (BID) | INTRAVENOUS | Status: DC
  Administered 2024-07-18 (×2): 2 g via INTRAVENOUS
  Filled 2024-07-17 (×2): qty 12.5

## 2024-07-17 MED ORDER — SODIUM CHLORIDE 0.9% FLUSH
3.0000 mL | Freq: Two times a day (BID) | INTRAVENOUS | Status: DC
  Administered 2024-07-17 – 2024-07-18 (×2): 3 mL via INTRAVENOUS

## 2024-07-17 MED ORDER — METHYLPREDNISOLONE SODIUM SUCC 40 MG IJ SOLR
40.0000 mg | Freq: Two times a day (BID) | INTRAMUSCULAR | Status: DC
  Administered 2024-07-17 – 2024-07-18 (×2): 40 mg via INTRAVENOUS
  Filled 2024-07-17 (×3): qty 1

## 2024-07-17 MED ORDER — SODIUM CHLORIDE 0.9 % IV SOLN
2.0000 g | Freq: Once | INTRAVENOUS | Status: AC
  Administered 2024-07-17: 2 g via INTRAVENOUS
  Filled 2024-07-17: qty 12.5

## 2024-07-17 MED ORDER — BISACODYL 5 MG PO TBEC: 5.0000 mg | DELAYED_RELEASE_TABLET | Freq: Every day | ORAL | Status: DC | PRN

## 2024-07-17 MED ORDER — HYDROMORPHONE HCL 1 MG/ML IJ SOLN
0.5000 mg | INTRAMUSCULAR | Status: DC | PRN
  Administered 2024-07-18: 1 mg via INTRAVENOUS
  Filled 2024-07-17: qty 1

## 2024-07-17 MED ORDER — SODIUM CHLORIDE 0.9 % IV SOLN
200.0000 mg | Freq: Once | INTRAVENOUS | Status: AC
  Administered 2024-07-17: 200 mg via INTRAVENOUS
  Filled 2024-07-17: qty 40

## 2024-07-17 MED ORDER — FLORANEX PO PACK
1.0000 g | PACK | Freq: Three times a day (TID) | ORAL | Status: DC
  Filled 2024-07-17 (×4): qty 1

## 2024-07-17 MED ORDER — DOCUSATE SODIUM 100 MG PO CAPS: 100.0000 mg | ORAL_CAPSULE | Freq: Two times a day (BID) | ORAL | Status: DC

## 2024-07-17 MED ORDER — LEVOTHYROXINE SODIUM 50 MCG PO TABS: 50.0000 ug | ORAL_TABLET | Freq: Every day | ORAL | Status: DC

## 2024-07-17 MED ORDER — SENNOSIDES-DOCUSATE SODIUM 8.6-50 MG PO TABS: 1.0000 | ORAL_TABLET | Freq: Every evening | ORAL | Status: DC | PRN

## 2024-07-17 MED ORDER — VANCOMYCIN HCL 500 MG/100ML IV SOLN
500.0000 mg | INTRAVENOUS | Status: DC
  Filled 2024-07-17: qty 100

## 2024-07-17 MED ORDER — HYDRALAZINE HCL 20 MG/ML IJ SOLN: 10.0000 mg | INTRAMUSCULAR | Status: DC | PRN

## 2024-07-17 MED ORDER — IPRATROPIUM-ALBUTEROL 0.5-2.5 (3) MG/3ML IN SOLN
3.0000 mL | Freq: Once | RESPIRATORY_TRACT | Status: AC
  Administered 2024-07-17: 3 mL via RESPIRATORY_TRACT
  Filled 2024-07-17: qty 3

## 2024-07-17 MED ORDER — FUROSEMIDE 10 MG/ML IJ SOLN
40.0000 mg | Freq: Two times a day (BID) | INTRAMUSCULAR | Status: DC
  Administered 2024-07-17: 40 mg via INTRAVENOUS
  Filled 2024-07-17: qty 4

## 2024-07-17 MED ORDER — IPRATROPIUM BROMIDE 0.02 % IN SOLN: 0.5000 mg | Freq: Four times a day (QID) | RESPIRATORY_TRACT | Status: DC | PRN

## 2024-07-17 MED ORDER — METHOCARBAMOL 500 MG PO TABS: 500.0000 mg | ORAL_TABLET | Freq: Three times a day (TID) | ORAL | Status: DC | PRN

## 2024-07-17 MED ORDER — HEPARIN SODIUM (PORCINE) 5000 UNIT/ML IJ SOLN: 5000.0000 [IU] | Freq: Three times a day (TID) | INTRAMUSCULAR | Status: DC

## 2024-07-17 MED ORDER — HYDROCOD POLI-CHLORPHE POLI ER 10-8 MG/5ML PO SUER: 5.0000 mL | Freq: Two times a day (BID) | ORAL | Status: DC

## 2024-07-17 MED ORDER — HYDRALAZINE HCL 25 MG PO TABS: 25.0000 mg | ORAL_TABLET | Freq: Three times a day (TID) | ORAL | Status: DC

## 2024-07-17 MED ORDER — IOHEXOL 350 MG/ML SOLN
75.0000 mL | Freq: Once | INTRAVENOUS | Status: AC | PRN
  Administered 2024-07-17: 75 mL via INTRAVENOUS

## 2024-07-17 MED ORDER — HEPARIN (PORCINE) 25000 UT/250ML-% IV SOLN
700.0000 [IU]/h | INTRAVENOUS | Status: DC
  Administered 2024-07-17: 700 [IU]/h via INTRAVENOUS
  Filled 2024-07-17: qty 250

## 2024-07-17 MED ORDER — VANCOMYCIN HCL IN DEXTROSE 1-5 GM/200ML-% IV SOLN
1000.0000 mg | Freq: Once | INTRAVENOUS | Status: DC
  Administered 2024-07-17: 1000 mg via INTRAVENOUS
  Filled 2024-07-17: qty 200

## 2024-07-17 MED ORDER — SODIUM CHLORIDE 0.9 % IV SOLN
100.0000 mg | Freq: Every day | INTRAVENOUS | Status: DC
  Administered 2024-07-18: 100 mg via INTRAVENOUS
  Filled 2024-07-17: qty 20

## 2024-07-17 MED ORDER — OXYCODONE HCL 5 MG PO TABS: 5.0000 mg | ORAL_TABLET | ORAL | Status: DC | PRN

## 2024-07-17 MED ORDER — TRAZODONE HCL 50 MG PO TABS: 25.0000 mg | ORAL_TABLET | Freq: Every evening | ORAL | Status: DC | PRN

## 2024-07-17 MED ORDER — FUROSEMIDE 10 MG/ML IJ SOLN
20.0000 mg | Freq: Once | INTRAMUSCULAR | Status: AC
  Administered 2024-07-17: 20 mg via INTRAVENOUS
  Filled 2024-07-17: qty 2

## 2024-07-17 MED ORDER — ACETAMINOPHEN 325 MG PO TABS: 650.0000 mg | ORAL_TABLET | Freq: Four times a day (QID) | ORAL | Status: DC | PRN

## 2024-07-17 MED ORDER — LACTATED RINGERS IV SOLN: 150.0000 mL/h | INTRAVENOUS | Status: DC

## 2024-07-17 MED ORDER — PERFLUTREN LIPID MICROSPHERE
1.0000 mL | INTRAVENOUS | Status: AC | PRN
  Administered 2024-07-17: 2 mL via INTRAVENOUS

## 2024-07-17 NOTE — Assessment & Plan Note (Signed)
 Acute hypoxic respiratory failure Multifactorial -Likely exacerbated by SARS-CoV-2 infection, right pleural effusion, CHF exacerbation -Continue BiPAP -VBG/ABG as needed -CT angio reviewed, negative for pulmonary embolism, consistent with infiltrate, congestion inflammation, large right pleural effusion  - On BiPAP -will continue with a slow taper -Continue IV diuretics -Continue IV antibiotics, IV steroids, nebs, -IV remdesivir  -Will follow-up with labs accordingly  - Continue pulmonary toiletry

## 2024-07-17 NOTE — Progress Notes (Signed)
" °  Echocardiogram 2D Echocardiogram has been performed.  [name] 07/17/2024, 2:08 PM "

## 2024-07-17 NOTE — Assessment & Plan Note (Signed)
 HFpEF -  Last echo 2022 EF 55-60%, normal LV function, mild LVH, grade 1 diastolic dysfunction, RV systolic function normal, no significant valvular abnormalities - CT angio consistent with severe congestion, versus inflammation -BNP 28, 563.0 -Initiating IV diuretics 40 mg twice daily -Repeating 2D echocardiogram -Cardiology consulted, appreciate further evaluation and recommendations

## 2024-07-17 NOTE — Assessment & Plan Note (Addendum)
-   Holding home medication of Hydralazine , Norvasc   in setting of sepsis, borderline hypotension -Once BP improves, will monitor BP closely, will utilize as needed IV hydralazine  for now

## 2024-07-17 NOTE — H&P (Signed)
 " History and Physical   Patient: Natasha Harper                            PCP: Charlett Apolinar POUR, MD                    DOB: 1934-09-14            DOA: 07/17/2024 FMW:[id number]             DOS: 07/17/2024, 2:16 PM  Panosh, Apolinar POUR, MD  Patient coming from:   HOME  I have personally reviewed patient's medical records, in electronic medical records, including:  [facility], and care everywhere.    Chief Complaint:   Chief Complaint  Patient presents with   Respiratory Distress    History of present illness:    Natasha Harper is a 89 y.o female with CAD,  HLD, HTN, HFpEF, hypothyroidism, OA  recent left hip fracture, at the SNF--- presented with shortness of breath, hypoxia satting 78% on room air at the facility.  On presentation was denying having any chest pain, or recent illnesses.  Denies ever having fever or chills, nausea or vomiting. Patient is full code not O2 dependent at baseline   ED Evaluation: 78% on room air Blood pressure 115/60, pulse 95, temperature (!) 96.8 F (36 C), temperature source Axillary, resp. rate (!) 21, last menstrual period 07/07/1997, SpO2 95% on BiPAP  Labs: VBG within normal with exception of pO2 48, WBC 7.0, lactic acid 1.6, hemoglobin 8.7, glucose 123, BUN 43, creatinine 0.9, BNP 28, 563.0, troponin 622 SARS-CoV-2 positive, influenza A, B, RSV all negative  CT angio: No pulmonary embolism, bilateral upper lobe opacity, consistent with pneumonia bilateral versus atypical, large right and small left pleural effusion  Osteopenia and chronically advanced spinal compression fractures, chronic rib fractures.   Admitting diagnosis acute respiratory failure, likely due to SARS-CoV-2 infection, right pleural effusion, non-STEMI vs demand ischemia, congestive heart failure exacerbation    Patient Denies having: Fever, Chills, Cough, Chest Pain, Abd pain, N/V/D, headache, dizziness, lightheadedness,  Dysuria, Joint pain, rash, open  wounds     Review of Systems: As per HPI, otherwise 10 point review of systems were negative.   ----------------------------------------------------------------------------------------------------------------------  Allergies[1]  Home MEDs:  Prior to Admission medications  Medication Sig Start Date End Date Taking? Authorizing Provider  acetaminophen  (TYLENOL ) 325 MG tablet Take 1-2 tablets (325-650 mg total) by mouth every 6 (six) hours as needed for mild pain (pain score 1-3), fever, moderate pain (pain score 4-6) or headache (or temp > 100.5). 06/17/24   Danton Lauraine LABOR, PA-C  amLODipine  (NORVASC ) 5 MG tablet Take 1 tablet (5 mg total) by mouth daily. 06/23/24 07/23/24  Leotis Bogus, MD  apixaban  (ELIQUIS ) 2.5 MG TABS tablet Take 1 tablet (2.5 mg total) by mouth 2 (two) times daily. 06/17/24 07/17/24  Danton Lauraine LABOR, PA-C  [Paused] aspirin  EC 81 MG tablet Take 81 mg by mouth daily. Swallow whole. Wait to take this until: July 22, 2024    [provider]  atorvastatin  (LIPITOR ) 80 MG tablet TAKE 1 TABLET BY MOUTH DAILY Patient taking differently: Take 80 mg by mouth daily. TAKE 1 TABLET BY MOUTH DAILY 01/03/24   Panosh, [name], MD  docusate sodium  (COLACE) 100 MG capsule Take 1 capsule (100 mg total) by mouth 2 (two) times daily. 06/22/24   Leotis Bogus, MD  hydrALAZINE  (APRESOLINE ) 25 MG tablet  Take 1 tablet (25 mg total) by mouth every 8 (eight) hours. 06/22/24 07/22/24  Leotis Bogus, MD  HYDROcodone -acetaminophen  (NORCO/VICODIN) 5-325 MG tablet Take 1-2 tablets by mouth every 6 (six) hours as needed for moderate pain (pain score 4-6) or severe pain (pain score 7-10). 06/17/24   Danton Lauraine LABOR, PA-C  methocarbamol  (ROBAXIN ) 500 MG tablet Take 1 tablet (500 mg total) by mouth every 8 (eight) hours as needed for muscle spasms. 06/17/24   Danton Lauraine LABOR, PA-C  polyethylene glycol (MIRALAX  / GLYCOLAX ) 17 g packet Take 17 g by mouth daily as needed for mild  constipation. 06/22/24   Leotis Bogus, MD  SYNTHROID  50 MCG tablet TAKE 1 TABLET BY MOUTH DAILY Patient taking differently: Take 50 mcg by mouth daily. TAKE 1 TABLET BY MOUTH DAILY 01/03/24   Panosh, [name], MD    PRN MEDs: acetaminophen  **OR** acetaminophen , hydrALAZINE , HYDROmorphone  (DILAUDID ) injection, methocarbamol , ondansetron  **OR** ondansetron  (ZOFRAN ) IV, oxyCODONE , perflutren  lipid microspheres (DEFINITY ) IV suspension, senna-docusate, traZODone   Past Medical History:  Diagnosis Date   CAD (coronary artery disease)    PCI to RCA and diagonal in remote past, residual 70% LAD  /   nuclear, 2007, no ischemia   Carotid arterial disease    Doppler, June, 2011, stable, 60-79% R. ICA, 40-59% LICA   Compression fracture of thoracic vertebra, closed, initial encounter (HCC) 04/13/2021   Contact lens/glasses fitting    wears contacts or glasses   Dyslipidemia    Significant drop in LDL from Lipitor  even though LDL remains high   Ejection fraction    EF 65%, nuclear, 2007   Fibrocystic breast    Hip fracture (HCC) 09/05/2021   HTN (hypertension)     no med   Hypercholesterolemia    Hypothyroidism    Thyroid  surgery in the past, thyroid  nodules followed by Dr.Ellison   Lump or mass in breast 07/19/2007   Excised 01/12/13. B9 on pathQualifier: Diagnosis of  By: Charlett MD, Apolinar POUR     Osteoporosis    Prominent abdominal aortic pulsation    No abdominal aneurysm by ultrasound   Rectal fissure    Sacral fracture, closed (HCC) 04/13/2021    Past Surgical History:  Procedure Laterality Date   BREAST BIOPSY Right 01/12/2013   Procedure: Removal of right breast mass;  Surgeon: Sherlean JINNY Laughter, MD;  Location: [facility];  Service: General;  Laterality: Right;   BREAST EXCISIONAL BIOPSY  9/14   scar tissue from the needle biopsy   CORONARY ANGIOPLASTY  1993   FEMUR IM NAIL Left 09/05/2021   Procedure: orif left hip;  Surgeon: Vernetta Lonni GRADE, MD;  Location: [facility]  ORS;  Service: Orthopedics;  Laterality: Left;  fracture table, carm, shower curtain,smith and nephew   INTRAMEDULLARY (IM) NAIL INTERTROCHANTERIC Right 06/16/2024   Procedure: FIXATION, FRACTURE, INTERTROCHANTERIC, WITH INTRAMEDULLARY ROD;  Surgeon: Kendal Franky SQUIBB, MD;  Location: [facility] OR;  Service: Orthopedics;  Laterality: Right;   IR KYPHO THORACIC WITH BONE BIOPSY  07/11/2021   RECTOPERITONEAL FISTULA CLOSURE     THYROID  CYST EXCISION       reports that she quit smoking about 66 years ago. Her smoking use included cigarettes. She has never used smokeless tobacco. She reports that she does not drink alcohol  and does not use drugs.   Family History  Problem Relation Age of Onset   Heart attack Father    Osteoporosis Mother    Breast cancer Daughter     Physical Exam:  Vitals:   07/17/24 1118 07/17/24 1130 07/17/24 1135 07/17/24 1245  BP: 116/65 117/64  115/60  Pulse: 96 (!) 101 98 95  Resp: (!) 25 (!) 40 (!) 30 (!) 21  Temp: (!) 96.8 F (36 C)     TempSrc: Axillary     SpO2: (!) 84% 95% 93% 95%   Constitutional: NAD, calm, comfortable Eyes: PERRL, lids and conjunctivae normal ENMT: Mucous membranes are moist. Posterior pharynx clear of any exudate or lesions.Normal dentition.  Neck: normal, supple, no masses, no thyromegaly Respiratory: clear to auscultation bilaterally, no wheezing, no crackles. Normal respiratory effort. No accessory muscle use.  Cardiovascular: Regular rate and rhythm, no murmurs / rubs / gallops. No extremity edema. 2+ pedal pulses. No carotid bruits.  Abdomen: no tenderness, no masses palpated. No hepatosplenomegaly. Bowel sounds positive.  Musculoskeletal: no clubbing / cyanosis. No joint deformity upper and lower extremities. Good ROM, no contractures. Normal muscle tone.  Neurologic: CN II-XII grossly intact. Sensation intact, DTR normal. Strength 5/5 in all 4.  Psychiatric: Normal judgment and insight. Alert and oriented x 3. Normal mood.  Skin: no  rashes, lesions, ulcers. No induration Decubitus/ulcers:  Wounds: per nursing documentation         Labs on admission:    I have personally reviewed following labs and imaging studies  CBC: Recent Labs  Lab 07/17/24 1119 07/17/24 1135 07/17/24 1136  WBC 7.0  --   --   NEUTROABS 6.7  --   --   HGB 8.7* 13.3 12.9  HCT 27.0* 39.0 38.0  MCV 110.2*  --   --   PLT 270  --   --    Basic Metabolic Panel: Recent Labs  Lab 07/17/24 1119 07/17/24 1135 07/17/24 1136  NA 136 138 138  K 4.7 4.6 4.6  CL 101 105  --   CO2 21*  --   --   GLUCOSE 127* 123*  --   BUN 42* 43*  --   CREATININE 0.83 0.90  --   CALCIUM  9.2  --   --    GFR: CrCl cannot be calculated (Unknown ideal weight.). Liver Function Tests: Recent Labs  Lab 07/17/24 1119  AST 137*  ALT 27  ALKPHOS 136*  BILITOT 0.4  PROT 6.6  ALBUMIN 3.5    BNP (last 3 results) Recent Labs    07/17/24 1119  PROBNP 28,563.0*    Urine analysis:    Component Value Date/Time   COLORURINE YELLOW 10/07/2023 1236   APPEARANCEUR CLEAR 10/07/2023 1236   LABSPEC 1.025 10/07/2023 1236   PHURINE 5.0 10/07/2023 1236   GLUCOSEU NEGATIVE 10/07/2023 1236   HGBUR NEGATIVE 10/07/2023 1236   HGBUR negative 07/19/2007 0851   BILIRUBINUR NEGATIVE 10/07/2023 1236   BILIRUBINUR n 03/18/2016 1328   KETONESUR 20 (A) 10/07/2023 1236   PROTEINUR 30 (A) 10/07/2023 1236   UROBILINOGEN negative 03/18/2016 1328   UROBILINOGEN 0.2 07/19/2007 0851   NITRITE NEGATIVE 10/07/2023 1236   LEUKOCYTESUR NEGATIVE 10/07/2023 1236    Last A1C:  Lab Results  Component Value Date   HGBA1C 5.6 11/25/2017     Radiologic Exams on Admission:   DG Chest Port 1 View Result Date: 07/17/2024 EXAM: 1 VIEW(S) XRAY OF THE CHEST 07/17/2024 12:10:00 PM COMPARISON: CTA chest earlier today. CLINICAL HISTORY: 89 year old female with shortness of breath. FINDINGS: LUNGS AND PLEURA: Patchy bilateral airspace opacities. Prominent hilar vasculature. Small  bilateral pleural effusions. No pneumothorax. HEART AND MEDIASTINUM: Cardiomegaly. Aortic arch atherosclerosis. BONES AND SOFT TISSUES: Midthoracic  vertebroplasty noted. No acute osseous abnormality. IMPRESSION: 1. Bilateral pneumonia and pleural effusions; ventilation stable from CTA chest today. Electronically signed by: Helayne Hurst MD MD 07/17/2024 12:51 PM EST RP Workstation: [id number]   CT Angio Chest PE W and/or Wo Contrast Result Date: 07/17/2024 EXAM: CTA of the Chest with contrast for PE 07/17/2024 11:54:17 AM TECHNIQUE: CTA of the chest was performed after the administration of 75 mL of iohexol  (OMNIPAQUE ) 350 MG/ML injection. Multiplanar reformatted images are provided for review. MIP images are provided for review. Automated exposure control, iterative reconstruction, and/or weight based adjustment of the mA/kV was utilized to reduce the radiation dose to as low as reasonably achievable. COMPARISON: Prior chest CTA 07/05/2021, prior chest CT 10/05/2023 . CLINICAL HISTORY: 89 year old female. Pulmonary embolism suspected, low to intermediate probability, negative D-dimer. Recent hip replacement, increasing SOB. FINDINGS: PULMONARY ARTERIES: Excellent pulmonary artery contrast timing. No pulmonary artery filling defect. Main pulmonary artery is normal in caliber. MEDIASTINUM: Stable cardiomegaly. No pericardial effusion. Advanced calcified atherosclerosis of the coronary arteries and visible thoracic aorta. LYMPH NODES: No mediastinal, hilar or axillary lymphadenopathy. LUNGS AND PLEURA: Moderate to large layering right pleural effusion with simple fluid density favoring transudate. Smaller layering left pleural effusion with compressive left lower lobe atelectasis. Less pronounced right lower lobe atelectasis. Superimposed confluent bilateral upper lobes peribronchial opacity ranging from ground glass to solid, but little upper lobe consolidation at this time. This most resembles bilateral infection /  inflammation. Major airways remain patent. No pneumothorax. UPPER ABDOMEN: Limited images of the upper abdomen demonstrate severe calcified aortic atherosclerosis with evidence of hemodynamically significant aortic stenosis on series 4 image 130. SOFT TISSUES AND BONES: Generalized osteopenia. Chronic left 2nd through 4th rib fractures are stable. Widespread chronic spinal compression fractures, 1 previously augmented. Stable chronic sternal fracture. Mild respiratory motion. IMPRESSION: 1. No pulmonary embolism. 2. Bilateral upper lobe opacities, favor bilateral pneumonia and possible viral/atypical etiology. Superimposed moderate to large right and smaller left pleural effusions with lower lobe atelectasis. 3. Severe calcified aortic atherosclerosis with hemodynamically significant abdominal aortic stenosis suspected. 4. Osteopenia and chronically advanced spinal compression fractures, chronic rib fractures. Electronically signed by: Helayne Hurst MD MD 07/17/2024 12:16 PM EST RP Workstation: [id number]    EKG:   Independently reviewed.  Orders placed or performed during the hospital encounter of 07/17/24   ED EKG   ED EKG   EKG 12-Lead   EKG 12-Lead   Repeat EKG   Repeat EKG   EKG 12-Lead   EKG 12-Lead   EKG 12-Lead   ---------------------------------------------------------------------------------------------------------------------------------------    Assessment / Plan:   Principal Problem:   Acute hypoxic respiratory failure (HCC) Active Problems:   Elevated troponin   Sepsis (HCC)   (HFpEF) heart failure with preserved ejection fraction (HCC)   SARS-CoV-2 positive   CAD (coronary artery disease)   Primary hypertension   Femoral fracture (HCC)   Hyperlipidemia   Macrocytic anemia   Hypothyroidism   Thoracic compression fracture (HCC)   Pleural effusion due to CHF (congestive heart failure) (HCC)   Assessment and Plan: * Acute hypoxic respiratory failure (HCC) Acute  hypoxic respiratory failure Multifactorial -Likely exacerbated by SARS-CoV-2 infection, right pleural effusion, CHF exacerbation -Continue BiPAP -VBG/ABG as needed -CT angio reviewed, negative for pulmonary embolism, consistent with infiltrate, congestion inflammation, large right pleural effusion  - On BiPAP -will continue with a slow taper -Continue IV diuretics -Continue IV antibiotics, IV steroids, nebs, -IV remdesivir  -Will follow-up with labs accordingly  - Continue pulmonary toiletry  SARS-CoV-2 positive SARS COVID positive Contributing to acute respiratory failure -Continue supportive therapy, BiPAP, nebs, IV steroids, remdesivir   (HFpEF) heart failure with preserved ejection fraction (HCC) HFpEF -  Last echo 2022 EF 55-60%, normal LV function, mild LVH, grade 1 diastolic dysfunction, RV systolic function normal, no significant valvular abnormalities - CT angio consistent with severe congestion, versus inflammation -BNP 28, 563.0 -Initiating IV diuretics 40 mg twice daily -Repeating 2D echocardiogram -Cardiology consulted, appreciate further evaluation and recommendations  Sepsis [facility]) Meeting sepsis criteria -acute respiratory failure, hypothermia, tachypnea, tachycardia  - Likely source pneumonia bacterial versus viral (likely viral COVID-pneumonia) - -  - Will follow with the blood cultures -Will try to obtain sputum cultures  -Empiric broad-spectrum antibiotics (vancomycin /Flagyl/cefepime ) initiated in ED, will continue for now -in anticipation of discontinuing in next 24's-48 hours  - SARS-CoV-2 positive  - initiating remdesivir  x 3 days - IV steroids   Elevated troponin Ruling out non-STEMI, versus ischemic demand  Elevated troponin 622 >>  -Setting of acute respiratory failure, CHF exacerbation -Denies any chest pain, likely demand ischemia -Repeating 2D echocardiogram, recycling cardiac enzyme -Initiated aspirin , heparin  drip per  cardiology -Appreciate cardiology following closely -Patient was given aspirin , continue as needed analgesics,   Femoral fracture (HCC) Recent history of left hip fracture -Continue PT OT when stable -  Primary hypertension - Holding home medication of Hydralazine , Norvasc   in setting of sepsis, borderline hypotension -Once BP improves, will monitor BP closely, will utilize as needed IV hydralazine  for now  CAD (coronary artery disease) Extensive history of coronary artery disease, PCI to RCA 2007 Nuclear stress test 2013 Recent echo reviewed, EF 55 to 67% Grade 1 diastolic dysfunction -Currently on heparin  drip -Continue high-dose statins,, aspirin ,  Macrocytic anemia Anemia of chronic disease, microcytic anemia -Monitoring H&H closely -No signs of active bleeding -Hemoglobin/hematocrit-at baseline      Hyperlipidemia Continue atorvastatin  80 mg daily  Pleural effusion due to CHF (congestive heart failure) (HCC) Pleural effusion likely due to CHF exacerbation Exacerbated by infection (SARS-CoV-2 viral pneumonia versus bacterial) -Right greater than left -Pursuing with US  guided thoracentesis, and labs -Continue diuretics -Continue respiratory support on BiPAP   Chronic multiple spinal compression fractures - Stable continue PT OT, as needed analgesics   Note: Patient is on Eliquis  likely for DVT prophylaxis post hip surgery    Consults called: Cardiology PCCM remotely consulted by EDP  -------------------------------------------------------------------------------------------------------------------------------------------- DVT prophylaxis:  Place TED hose Start: 07/17/24 1319 SCDs Start: 07/17/24 1318   Code Status:   Code Status: Full Code Discussed with family patient is confirmed full code  Admission status: Patient will be admitted as Inpatient, with a greater than 2 midnight length of stay. Level of care: Progressive   Family Communication:  Husband (The above findings and plan of care has been discussed with patient in detail, the patient expressed understanding and agreement of above plan)  --------------------------------------------------------------------------------------------------------------------------------------------------  Disposition Plan:  Anticipated 1-2 days Status is: Inpatient Remains inpatient appropriate because: Needing respiratory support, BiPAP, diuretics, procedures such as thoracentesis, IV antibiotics, IV antiviral, IV steroids, nebs     ----------------------------------------------------------------------------------------------------------------------------------------------------  Critical care time spent:  64  Min.  Was spent seeing and evaluating the patient, reviewing all medical records, drawn plan of care.  SIGNED: Adriana DELENA Grams, MD, FHM. FAAFP. [facility], Pager  (Please use [email or web address] to page/ or secure chat through epic) If 7PM-7AM, please contact night-coverage [email or web address],  07/17/2024, 2:16 PM     [1]  Allergies Allergen Reactions  Actonel [Risedronate Sodium] Other (See Comments)    Bloating   Evista [Raloxifene] Other (See Comments)    Abdominal cramps   Fosamax [Alendronate Sodium] Other (See Comments)    Abdominal cramps   "

## 2024-07-17 NOTE — Assessment & Plan Note (Signed)
 Ruling out non-STEMI, versus ischemic demand  Elevated troponin 622 >>  -Setting of acute respiratory failure, CHF exacerbation -Denies any chest pain, likely demand ischemia -Repeating 2D echocardiogram, recycling cardiac enzyme -Initiated aspirin , heparin  drip per cardiology -Appreciate cardiology following closely -Patient was given aspirin , continue as needed analgesics,

## 2024-07-17 NOTE — Assessment & Plan Note (Signed)
 Anemia of chronic disease, microcytic anemia -Monitoring H&H closely -No signs of active bleeding -Hemoglobin/hematocrit-at baseline

## 2024-07-17 NOTE — Consult Note (Signed)
 " Cardiology Consultation:  Patient ID: Natasha Harper MRN: [id number]; DOB: Jan 12, 1935 Admit date: 07/17/2024 Date of Consult: 07/17/2024  Primary Care Provider: Charlett Apolinar POUR, MD Primary Cardiologist: Vina Gull, MD  Primary Electrophysiologist:  None   Patient Profile:  Natasha Harper is a 89 y.o. female with a hx of CAD s/p PCI (RCA/D1 in 1993) who is being seen today for the evaluation of troponin elevation at the request of Dr Leanne.  History of Present Illness:  [name] presents with hypoxemia.  She had hip surgery few weeks ago and has been in rehab since then.  Earlier today, she became hypoxic to the 49s and was having some increased work of breathing.  She was initially placed on nonrebreather and subsequently on BiPAP.  Talking to the patient, she denies any shortness of breath and any chest pain over the last couple weeks.  She had not had any cardiac issues since her PCI back in 1993.  She has been on Eliquis  since her surgery for DVT prophylaxis.  Her last dose was this morning.  In the ED, she was hypoxic to the 80s and was placed on BiPAP.  She received some DuoNebs and was subsequently started on antibiotics for possible pneumonia.  Labs today with AKI with creatinine of 0.9 from a baseline of 0.6 and BNP elevated at 28,000 with initial troponin of 622.  Panel positive for COVID.  CT PE with bilateral pneumonia, likely viral and superimposed moderate to large right pleural effusion.  EKG today shows new Q waves in the lateral leads and old Q waves in inferior leads compared to the EKG back in March 2025.  Past Medical History: CAD s/p PCI 1993  Past Surgical History: Hip surgery 12/25  Allergies:    Allergies[1]  Social History: Currently living in rehab. Quit smoking 40 years ago.   Family History:   Noncontributory  ROS:  All other ROS reviewed and negative. Pertinent positives noted in the HPI.     Physical Exam/Data:   Vitals:   07/17/24 1118 07/17/24  1130 07/17/24 1135 07/17/24 1245  BP: 116/65 117/64  115/60  Pulse: 96 (!) 101 98 95  Resp: (!) 25 (!) 40 (!) 30 (!) 21  Temp: (!) 96.8 F (36 C)     TempSrc: Axillary     SpO2: (!) 84% 95% 93% 95%    Intake/Output Summary (Last 24 hours) at 07/17/2024 1323 Last data filed at 07/17/2024 1300 Gross per 24 hour  Intake 96.94 ml  Output --  Net 96.94 ml       06/16/2024   11:12 AM 06/16/2024    2:53 AM 10/05/2023    8:15 PM  Last 3 Weights  Weight (lbs) 120 lb 120 lb 141 lb 8.6 oz  Weight (kg) 54.432 kg 54.432 kg 64.2 kg    There is no height or weight on file to calculate BMI.  General: Well nourished, well developed, in no acute distress HEENT: Atraumatic, no JVD Cardiac: Normal S1, S2; RRR; no murmurs, rubs, or gallops Lungs:CTAB w/ expiratory crackles Abd: Soft, nontender, no hepatomegaly  Ext: No edema, pulses 2+ Skin: Warm and dry, no rashes     Relevant CV Studies:  Reviewed Assessment and Plan:  Marcey is an 89 year old with a history of CAD status post PCI back in 1993 who is presenting with acute respiratory failure.  Labs with elevated BNP and troponin, and CT PE with evidence of pneumonia and volume overload.  Bedside echo showed  reduced EF with hypokinesis of the anterior wall, which is new from prior.  Formal TTE with newly reduced EF 35-40% and WMAs. Patient denies chest pain and respiratory status has improved on BiPAP. Discussed case with interventional cardiology. Will treat medically for now with plan for possible LHC in AM.  #Acute hypoxic respiratory failure #COVID PNA #NSTEMI #Volume overload - CP free at this time. - s/p ASA load w/ 325; cont ASA 81 mg and home lipitor  80 - Start heparin  gtt - IV lasix  40 mg BID - SLN nitroglycerin as needed for CP - Trend troponin Q4H - NPO at midnight for possible LHC   For questions or updates, please contact [facility] Please consult [email or web address] for contact info under    Signed, Joelle DEL. Ren Ny, MD, [facility]  [facility]  07/17/2024 1:23 PM     [1]  Allergies Allergen Reactions   Actonel [Risedronate Sodium] Other (See Comments)    Bloating   Evista [Raloxifene] Other (See Comments)    Abdominal cramps   Fosamax [Alendronate Sodium] Other (See Comments)    Abdominal cramps   "

## 2024-07-17 NOTE — Assessment & Plan Note (Signed)
 Recent history of left hip fracture -Continue PT OT when stable -

## 2024-07-17 NOTE — Progress Notes (Signed)
 PHARMACY - ANTICOAGULATION CONSULT NOTE  Pharmacy Consult for heparin  Indication: chest pain/ACS  Allergies[1]  Patient Measurements:    Vital Signs: Temp: 96.8 F (36 C) (01/11 1118) Temp Source: Axillary (01/11 1118) BP: 115/60 (01/11 1245) Pulse Rate: 95 (01/11 1245)  Labs: Recent Labs    07/17/24 1119 07/17/24 1135 07/17/24 1136  HGB 8.7* 13.3 12.9  HCT 27.0* 39.0 38.0  PLT 270  --   --   CREATININE 0.83 0.90  --     CrCl cannot be calculated (Unknown ideal weight.).   Medical History: Past Medical History:  Diagnosis Date   CAD (coronary artery disease)    PCI to RCA and diagonal in remote past, residual 70% LAD  /   nuclear, 2007, no ischemia   Carotid arterial disease    Doppler, June, 2011, stable, 60-79% R. ICA, 40-59% LICA   Compression fracture of thoracic vertebra, closed, initial encounter (HCC) 04/13/2021   Contact lens/glasses fitting    wears contacts or glasses   Dyslipidemia    Significant drop in LDL from Lipitor  even though LDL remains high   Ejection fraction    EF 65%, nuclear, 2007   Fibrocystic breast    Hip fracture (HCC) 09/05/2021   HTN (hypertension)     no med   Hypercholesterolemia    Hypothyroidism    Thyroid  surgery in the past, thyroid  nodules followed by Dr.Ellison   Lump or mass in breast 07/19/2007   Excised 01/12/13. B9 on pathQualifier: Diagnosis of  By: Charlett MD, Apolinar POUR     Osteoporosis    Prominent abdominal aortic pulsation    No abdominal aneurysm by ultrasound   Rectal fissure    Sacral fracture, closed (HCC) 04/13/2021     Assessment: 89 YOF presenting with respiratory distress, elevated troponin, she is s/p ORIF on eliquis  2.5mg  BID for DVT ppx post-op  Goal of Therapy:  Heparin  level 0.3-0.7 units/ml aPTT 66-102 seconds Monitor platelets by anticoagulation protocol: Yes   Plan:  Heparin  gtt at 700 units/hr, no bolus F/u 8 hour aPTT/HL F/u cards eval and recs  Dorn Poot, PharmD,  [facility] Clinical Pharmacist ED Pharmacist Phone # [phone] 07/17/2024 12:56 PM       [1]  Allergies Allergen Reactions   Actonel [Risedronate Sodium] Other (See Comments)    Bloating   Evista [Raloxifene] Other (See Comments)    Abdominal cramps   Fosamax [Alendronate Sodium] Other (See Comments)    Abdominal cramps

## 2024-07-17 NOTE — Assessment & Plan Note (Addendum)
 Pleural effusion likely due to CHF exacerbation Exacerbated by infection (SARS-CoV-2 viral pneumonia versus bacterial) -Right greater than left -Pursuing with US  guided thoracentesis, and labs -Continue diuretics -Continue respiratory support on BiPAP

## 2024-07-17 NOTE — ED Notes (Signed)
 Echo at bedside

## 2024-07-17 NOTE — Progress Notes (Signed)
 PHARMACY - ANTICOAGULATION CONSULT NOTE  Pharmacy Consult for heparin  Indication: chest pain/ACS  Allergies[1]  Patient Measurements: Height: 5' (152.4 cm) Weight: 59.3 kg (130 lb 11.7 oz) IBW/kg (Calculated) : 45.5  Vital Signs: Temp: 98.4 F (36.9 C) (01/11 1900) Temp Source: Axillary (01/11 1900) BP: 137/76 (01/11 2200) Pulse Rate: 103 (01/11 1952)  Labs: Recent Labs    07/17/24 1119 07/17/24 1135 07/17/24 1136 07/17/24 1619 07/17/24 2159  HGB 8.7* 13.3 12.9  --   --   HCT 27.0* 39.0 38.0  --   --   PLT 270  --   --   --   --   APTT  --   --   --   --  83*  LABPROT  --   --   --  20.4*  --   INR  --   --   --  1.7*  --   HEPARINUNFRC  --   --   --   --  >1.10*  CREATININE 0.83 0.90  --   --   --     Estimated Creatinine Clearance: 35.5 mL/min (by C-G formula based on SCr of 0.9 mg/dL).   Medical History: Past Medical History:  Diagnosis Date   CAD (coronary artery disease)    PCI to RCA and diagonal in remote past, residual 70% LAD  /   nuclear, 2007, no ischemia   Carotid arterial disease    Doppler, June, 2011, stable, 60-79% R. ICA, 40-59% LICA   Compression fracture of thoracic vertebra, closed, initial encounter (HCC) 04/13/2021   Contact lens/glasses fitting    wears contacts or glasses   Dyslipidemia    Significant drop in LDL from Lipitor  even though LDL remains high   Ejection fraction    EF 65%, nuclear, 2007   Fibrocystic breast    Hip fracture (HCC) 09/05/2021   HTN (hypertension)     no med   Hypercholesterolemia    Hypothyroidism    Thyroid  surgery in the past, thyroid  nodules followed by Dr.Ellison   Lump or mass in breast 07/19/2007   Excised 01/12/13. B9 on pathQualifier: Diagnosis of  By: Charlett MD, Apolinar POUR     Osteoporosis    Prominent abdominal aortic pulsation    No abdominal aneurysm by ultrasound   Rectal fissure    Sacral fracture, closed (HCC) 04/13/2021     Assessment: 65 YOF presenting with respiratory distress and  elevated troponin. she is s/p ORIF on eliquis  2.5mg  BID for DVT ppx post-op. Pt currently on bipap. Pharmacy consulted for heparin .   aPTT 83, therapeutic Heparin  level expected to be elevated in presence of DOAC pta.  No issues with infusion or bleeding noted.   Goal of Therapy:  Heparin  level 0.3-0.7 units/ml aPTT 66-102 seconds Monitor platelets by anticoagulation protocol: Yes   Plan:  Continue heparin  gtt at 700 units/hr, no bolus Daily HL, aPTT, and CBC  F/u cards eval and recs  Thank you for allowing pharmacy to participate in this patient's care.  Leonor GORMAN Bash, PharmD Emergency Medicine Clinical Pharmacist 07/17/2024,10:49 PM        [1]  Allergies Allergen Reactions   Actonel [Risedronate Sodium] Other (See Comments)    Bloating   Evista [Raloxifene] Other (See Comments)    Abdominal cramps   Fosamax [Alendronate Sodium] Other (See Comments)    Abdominal cramps

## 2024-07-17 NOTE — Assessment & Plan Note (Signed)
 SARS COVID positive Contributing to acute respiratory failure -Continue supportive therapy, BiPAP, nebs, IV steroids, remdesivir 

## 2024-07-17 NOTE — Progress Notes (Signed)
 Patient transported on BIPAP to CT2 and back without any issues.

## 2024-07-17 NOTE — ED Notes (Signed)
 RT coming to give nebulizer solutions.

## 2024-07-17 NOTE — ED Triage Notes (Signed)
 Pt got hip replacement about a month ago. New onset SOB, increased work of breathing, with RA 78%. 88% on 15 non-rebreather. Started sometime this morning. Pt is not on O2 at baseline.

## 2024-07-17 NOTE — Progress Notes (Signed)
 Patient transported on BIPAP from ED01 to 3E16 without any issues.

## 2024-07-17 NOTE — Progress Notes (Signed)
 ED Pharmacy Antibiotic Sign Off An antibiotic consult was received from an ED provider for vancomycin  and cefepime  per pharmacy dosing for pneumonia. A chart review was completed to assess appropriateness.   The following one time order(s) were placed:  Vancomycin  1g Iv x 1 Cefepime  2g IV x 1  Further antibiotic and/or antibiotic pharmacy consults should be ordered by the admitting provider if indicated.   Thank you for allowing pharmacy to be a part of this patient's care.   Dorn Poot, [facility]  Clinical Pharmacist 07/17/2024 12:04 PM

## 2024-07-17 NOTE — Assessment & Plan Note (Signed)
 Meeting sepsis criteria -acute respiratory failure, hypothermia, tachypnea, tachycardia  - Likely source pneumonia bacterial versus viral (likely viral COVID-pneumonia) - -  - Will follow with the blood cultures -Will try to obtain sputum cultures  -Empiric broad-spectrum antibiotics (vancomycin /Flagyl/cefepime ) initiated in ED, will continue for now -in anticipation of discontinuing in next 24's-48 hours  - SARS-CoV-2 positive  - initiating remdesivir  x 3 days - IV steroids

## 2024-07-17 NOTE — Progress Notes (Signed)
 Pharmacy Antibiotic Note  Natasha Harper is a 89 y.o. female admitted on 07/17/2024 with pneumonia.  Pharmacy has been consulted for vancomycin  dosing.  Vancomycin  1g IV x 1 given in ED  Plan: Vancomycin  500 mg IV q 24h Add MRSA PCR Monitor renal function, PCR to narrow Vancomycin  levels as indicated     Temp (24hrs), Avg:96.8 F (36 C), Min:96.8 F (36 C), Max:96.8 F (36 C)  Recent Labs  Lab 07/17/24 1119 07/17/24 1135 07/17/24 1136 07/17/24 1327  WBC 7.0  --   --   --   CREATININE 0.83 0.90  --   --   LATICACIDVEN  --   --  1.6 1.9    CrCl cannot be calculated (Unknown ideal weight.).    Allergies[1]  Dorn Poot, PharmD, [facility] Clinical Pharmacist ED Pharmacist Phone # [phone] 07/17/2024 1:37 PM       [1]  Allergies Allergen Reactions   Actonel [Risedronate Sodium] Other (See Comments)    Bloating   Evista [Raloxifene] Other (See Comments)    Abdominal cramps   Fosamax [Alendronate Sodium] Other (See Comments)    Abdominal cramps

## 2024-07-17 NOTE — Hospital Course (Addendum)
 Natasha Harper is a 89 y.o female with CAD,  HLD, HTN, HFpEF, hypothyroidism, OA  recent left hip fracture, at the SNF--- presented with shortness of breath, hypoxia satting 78% on room air at the facility.  On presentation was denying having any chest pain, or recent illnesses.  Denies ever having fever or chills, nausea or vomiting. Patient is full code not O2 dependent at baseline   ED Evaluation: 78% on room air Blood pressure 115/60, pulse 95, temperature (!) 96.8 F (36 C), temperature source Axillary, resp. rate (!) 21, last menstrual period 07/07/1997, SpO2 95% on BiPAP  Labs: VBG within normal with exception of pO2 48, WBC 7.0, lactic acid 1.6, hemoglobin 8.7, glucose 123, BUN 43, creatinine 0.9, BNP 28, 563.0, troponin 622 SARS-CoV-2 positive, influenza A, B, RSV all negative  CT angio: No pulmonary embolism, bilateral upper lobe opacity, consistent with pneumonia bilateral versus atypical, large right and small left pleural effusion  Osteopenia and chronically advanced spinal compression fractures, chronic rib fractures.   Admitting diagnosis acute respiratory failure, likely due to SARS-CoV-2 infection, right pleural effusion, non-STEMI vs demand ischemia, congestive heart failure exacerbation

## 2024-07-17 NOTE — Assessment & Plan Note (Signed)
 Extensive history of coronary artery disease, PCI to RCA 2007 Nuclear stress test 2013 Recent echo reviewed, EF 55 to 67% Grade 1 diastolic dysfunction -Currently on heparin  drip -Continue high-dose statins,, aspirin ,

## 2024-07-17 NOTE — Sepsis Progress Note (Signed)
 Sepsis protocol is being followed by [facility].

## 2024-07-17 NOTE — ED Notes (Signed)
 Pt to ct with RT.

## 2024-07-17 NOTE — ED Provider Notes (Signed)
 " [place] EMERGENCY DEPARTMENT AT [facility] Provider Note   CSN: [id number] Arrival date & time: 07/17/24  1109     Patient presents with: Respiratory Distress   Swati STARKEISHA VANWINKLE is a 89 y.o. female.   89 year old female with prior medical history as detailed below presents for evaluation.  Patient presents from her facility with new onset shortness of breath.  Patient with noted increased work of breathing, room air sat 78% per EMS.  On 15 L nonrebreather during transport patient's sats were in the 88 to 90% range.  Patient is full code per MOST form.  Patient is not on supplemental O2 at baseline.  Patient reports that she feels improved with higher FiO2.  She denies chest pain, recent illness, nausea, vomiting, shortness of breath.  Prior medical history significant for hypertension, hyperlipidemia, CAD, hypothyroidism, left hip fracture.  She is on Eliquis , 2.5 mg twice daily.  The history is provided by the patient and medical records.       Prior to Admission medications  Medication Sig Start Date End Date Taking? Authorizing Provider  acetaminophen  (TYLENOL ) 325 MG tablet Take 1-2 tablets (325-650 mg total) by mouth every 6 (six) hours as needed for mild pain (pain score 1-3), fever, moderate pain (pain score 4-6) or headache (or temp > 100.5). 06/17/24   Danton Lauraine LABOR, PA-C  amLODipine  (NORVASC ) 5 MG tablet Take 1 tablet (5 mg total) by mouth daily. 06/23/24 07/23/24  Leotis Bogus, MD  apixaban  (ELIQUIS ) 2.5 MG TABS tablet Take 1 tablet (2.5 mg total) by mouth 2 (two) times daily. 06/17/24 07/17/24  Danton Lauraine LABOR, PA-C  [Paused] aspirin  EC 81 MG tablet Take 81 mg by mouth daily. Swallow whole. Wait to take this until: July 22, 2024    [provider]  atorvastatin  (LIPITOR ) 80 MG tablet TAKE 1 TABLET BY MOUTH DAILY Patient taking differently: Take 80 mg by mouth daily. TAKE 1 TABLET BY MOUTH DAILY 01/03/24   Panosh, [name], MD  docusate  sodium (COLACE) 100 MG capsule Take 1 capsule (100 mg total) by mouth 2 (two) times daily. 06/22/24   Leotis Bogus, MD  hydrALAZINE  (APRESOLINE ) 25 MG tablet Take 1 tablet (25 mg total) by mouth every 8 (eight) hours. 06/22/24 07/22/24  Leotis Bogus, MD  HYDROcodone -acetaminophen  (NORCO/VICODIN) 5-325 MG tablet Take 1-2 tablets by mouth every 6 (six) hours as needed for moderate pain (pain score 4-6) or severe pain (pain score 7-10). 06/17/24   Danton Lauraine LABOR, PA-C  methocarbamol  (ROBAXIN ) 500 MG tablet Take 1 tablet (500 mg total) by mouth every 8 (eight) hours as needed for muscle spasms. 06/17/24   Danton Lauraine LABOR, PA-C  polyethylene glycol (MIRALAX  / GLYCOLAX ) 17 g packet Take 17 g by mouth daily as needed for mild constipation. 06/22/24   Leotis Bogus, MD  SYNTHROID  50 MCG tablet TAKE 1 TABLET BY MOUTH DAILY Patient taking differently: Take 50 mcg by mouth daily. TAKE 1 TABLET BY MOUTH DAILY 01/03/24   Panosh, [name], MD    Allergies: Actonel [risedronate sodium], Evista [raloxifene], and Fosamax [alendronate sodium]    Review of Systems  Updated Vital Signs BP 116/65 (BP Location: Right Arm)   Pulse 96   Temp (!) 96.8 F (36 C) (Axillary)   Resp (!) 25   LMP 07/07/1997   SpO2 (!) 84%   Physical Exam  (all labs ordered are listed, but only abnormal results are displayed) Labs Reviewed  CULTURE, BLOOD (ROUTINE X 2)  CULTURE, BLOOD (ROUTINE X 2)  RESP PANEL BY RT-PCR (RSV, FLU A&B, COVID)  RVPGX2  CBC WITH DIFFERENTIAL/PLATELET  COMPREHENSIVE METABOLIC PANEL WITH GFR  PRO BRAIN NATRIURETIC PEPTIDE  URINALYSIS, W/ REFLEX TO CULTURE (INFECTION SUSPECTED)  I-STAT VENOUS BLOOD GAS, ED  I-STAT CHEM 8, ED  I-STAT CG4 LACTIC ACID, ED  TROPONIN T, HIGH SENSITIVITY    EKG: EKG Interpretation Date/Time:  Sunday July 17 2024 11:19:03 EST Ventricular Rate:  102 PR Interval:  130 QRS Duration:  103 QT Interval:  370 QTC Calculation: 482 R Axis:   140  Text  Interpretation: Right and left arm electrode reversal, interpretation assumes no reversal Sinus or ectopic atrial tachycardia Probable RVH w/ secondary repol abnormality Inferolateral infarct, age indeterminate Confirmed by Laurice Coy [phone]) on 07/17/2024 11:20:38 AM  Radiology: No results found.   Procedures   Medications Ordered in the ED  ipratropium-albuterol  (DUONEB) 0.5-2.5 (3) MG/3ML nebulizer solution 3 mL (has no administration in time range)                                    Medical Decision Making Patient presents with reportedly acute onset hypoxia, respiratory distress.  She is placed on BiPAP on arrival.  This improved her work of breathing.  She appears to be fairly comfortable on BiPAP with 50% FiO2.  CTA shows no PE.  However there are finding on CT of bilateral infiltrates with bilateral pleural effusions.  Antibiotics initiated for possible bacterial pneumonia.  White count is normal.  Lactic acid is normal.  COVID testing is positive.  Troponin is elevated at 622.  BNP is elevated at 28,000.  Patient without chest pain.  Cardiology made aware of case and is consulting.  They recommend heparinization, full dose aspirin , and Lasix  20 mg x 1.  Presentation is consistent with possible combination of acute COVID, bacterial pneumonia, congestive heart failure.  Given patient's full CODE STATUS, I briefly discussed the case with critical care.  Critical care feels that the patient would be appropriate for hospitalist service and progressive bed.  Hospitalist service made aware of case.  Amount and/or Complexity of Data Reviewed Labs: ordered. Radiology: ordered.  Risk OTC drugs. Prescription drug management. Decision regarding hospitalization.   CRITICAL CARE Performed by: Coy JAYSON Laurice   Total critical care time: 45 minutes  Critical care time was exclusive of separately billable procedures and treating other patients.  Critical care was  necessary to treat or prevent imminent or life-threatening deterioration.  Critical care was time spent personally by me on the following activities: development of treatment plan with patient and/or surrogate as well as nursing, discussions with consultants, evaluation of patient's response to treatment, examination of patient, obtaining history from patient or surrogate, ordering and performing treatments and interventions, ordering and review of laboratory studies, ordering and review of radiographic studies, pulse oximetry and re-evaluation of patient's condition.      Final diagnoses:  Hypoxia  Acute respiratory failure with hypoxia [facility])  Respiratory distress    ED Discharge Orders     None          Laurice Coy JAYSON, MD 07/17/24 1310  "

## 2024-07-17 NOTE — Assessment & Plan Note (Signed)
 Continue atorvastatin  80 mg daily

## 2024-07-18 DIAGNOSIS — J9601 Acute respiratory failure with hypoxia: Secondary | ICD-10-CM | POA: Diagnosis not present

## 2024-07-18 DIAGNOSIS — U071 COVID-19: Secondary | ICD-10-CM

## 2024-07-18 DIAGNOSIS — Z7189 Other specified counseling: Secondary | ICD-10-CM

## 2024-07-18 DIAGNOSIS — Z515 Encounter for palliative care: Secondary | ICD-10-CM | POA: Diagnosis not present

## 2024-07-18 DIAGNOSIS — I5020 Unspecified systolic (congestive) heart failure: Secondary | ICD-10-CM

## 2024-07-18 DIAGNOSIS — I214 Non-ST elevation (NSTEMI) myocardial infarction: Secondary | ICD-10-CM

## 2024-07-18 LAB — COMPREHENSIVE METABOLIC PANEL WITH GFR
ALT: 24 U/L (ref 0–44)
AST: 112 U/L — ABNORMAL HIGH (ref 15–41)
Albumin: 3.1 g/dL — ABNORMAL LOW (ref 3.5–5.0)
Alkaline Phosphatase: 118 U/L (ref 38–126)
Anion gap: 14 (ref 5–15)
BUN: 40 mg/dL — ABNORMAL HIGH (ref 8–23)
CO2: 21 mmol/L — ABNORMAL LOW (ref 22–32)
Calcium: 8.7 mg/dL — ABNORMAL LOW (ref 8.9–10.3)
Chloride: 106 mmol/L (ref 98–111)
Creatinine, Ser: 0.73 mg/dL (ref 0.44–1.00)
GFR, Estimated: >60 mL/min
Glucose, Bld: 103 mg/dL — ABNORMAL HIGH (ref 70–99)
Potassium: 3.8 mmol/L (ref 3.5–5.1)
Sodium: 141 mmol/L (ref 135–145)
Total Bilirubin: 0.3 mg/dL (ref 0.0–1.2)
Total Protein: 6 g/dL — ABNORMAL LOW (ref 6.5–8.1)

## 2024-07-18 LAB — CBC WITH DIFFERENTIAL/PLATELET
Abs Immature Granulocytes: 0.04 K/uL (ref 0.00–0.07)
Basophils Absolute: 0 K/uL (ref 0.0–0.1)
Basophils Relative: 0 %
Eosinophils Absolute: 0 K/uL (ref 0.0–0.5)
Eosinophils Relative: 0 %
HCT: 25.6 % — ABNORMAL LOW (ref 36.0–46.0)
Hemoglobin: 8.2 g/dL — ABNORMAL LOW (ref 12.0–15.0)
Immature Granulocytes: 1 %
Lymphocytes Relative: 2 %
Lymphs Abs: 0.2 K/uL — ABNORMAL LOW (ref 0.7–4.0)
MCH: 34.5 pg — ABNORMAL HIGH (ref 26.0–34.0)
MCHC: 32 g/dL (ref 30.0–36.0)
MCV: 107.6 fL — ABNORMAL HIGH (ref 80.0–100.0)
Monocytes Absolute: 0.3 K/uL (ref 0.1–1.0)
Monocytes Relative: 3 %
Neutro Abs: 7.5 K/uL (ref 1.7–7.7)
Neutrophils Relative %: 94 %
Platelets: 260 K/uL (ref 150–400)
RBC: 2.38 MIL/uL — ABNORMAL LOW (ref 3.87–5.11)
RDW: 18.6 % — ABNORMAL HIGH (ref 11.5–15.5)
WBC: 8 K/uL (ref 4.0–10.5)
nRBC: 0 % (ref 0.0–0.2)

## 2024-07-18 LAB — URINALYSIS, W/ REFLEX TO CULTURE (INFECTION SUSPECTED)
Bacteria, UA: NONE SEEN
Bilirubin Urine: NEGATIVE
Glucose, UA: NEGATIVE mg/dL
Hgb urine dipstick: NEGATIVE
Ketones, ur: NEGATIVE mg/dL
Leukocytes,Ua: NEGATIVE
Nitrite: NEGATIVE
Protein, ur: NEGATIVE mg/dL
Specific Gravity, Urine: 1.021 (ref 1.005–1.030)
pH: 5 (ref 5.0–8.0)

## 2024-07-18 LAB — PROTIME-INR
INR: 1.6 — ABNORMAL HIGH (ref 0.8–1.2)
Prothrombin Time: 20.3 s — ABNORMAL HIGH (ref 11.4–15.2)

## 2024-07-18 LAB — APTT: aPTT: 85 s — ABNORMAL HIGH (ref 24–36)

## 2024-07-18 LAB — HEPARIN LEVEL (UNFRACTIONATED): Heparin Unfractionated: >1.1 [IU]/mL — ABNORMAL HIGH (ref 0.30–0.70)

## 2024-07-18 LAB — PRO BRAIN NATRIURETIC PEPTIDE: Pro Brain Natriuretic Peptide: 24182 pg/mL — ABNORMAL HIGH

## 2024-07-18 SURGERY — LEFT HEART CATH AND CORONARY ANGIOGRAPHY
Anesthesia: LOCAL

## 2024-07-18 MED ORDER — METOPROLOL TARTRATE 5 MG/5ML IV SOLN: 5.0000 mg | INTRAVENOUS | Status: DC | PRN

## 2024-07-18 MED ORDER — METOPROLOL TARTRATE 5 MG/5ML IV SOLN
5.0000 mg | Freq: Once | INTRAVENOUS | Status: AC
  Administered 2024-07-18: 5 mg via INTRAVENOUS
  Filled 2024-07-18: qty 5

## 2024-07-18 MED ORDER — MORPHINE BOLUS VIA INFUSION
2.0000 mg | INTRAVENOUS | Status: DC | PRN
  Administered 2024-07-18: 2 mg via INTRAVENOUS
  Administered 2024-07-18: 4 mg via INTRAVENOUS

## 2024-07-18 MED ORDER — AMIODARONE LOAD VIA INFUSION
150.0000 mg | Freq: Once | INTRAVENOUS | Status: AC
  Administered 2024-07-18: 150 mg via INTRAVENOUS
  Filled 2024-07-18: qty 83.34

## 2024-07-18 MED ORDER — AMIODARONE HCL IN DEXTROSE 360-4.14 MG/200ML-% IV SOLN: 30.0000 mg/h | INTRAVENOUS | Status: DC

## 2024-07-18 MED ORDER — AMIODARONE HCL IN DEXTROSE 360-4.14 MG/200ML-% IV SOLN
60.0000 mg/h | INTRAVENOUS | Status: DC
  Administered 2024-07-18 (×2): 60 mg/h via INTRAVENOUS
  Filled 2024-07-18 (×2): qty 200

## 2024-07-18 MED ORDER — HALOPERIDOL 0.5 MG PO TABS: 0.5000 mg | ORAL_TABLET | ORAL | Status: DC | PRN

## 2024-07-18 MED ORDER — GLYCOPYRROLATE 0.2 MG/ML IJ SOLN
0.4000 mg | INTRAMUSCULAR | Status: DC
  Administered 2024-07-18: 0.4 mg via INTRAVENOUS
  Filled 2024-07-18: qty 2

## 2024-07-18 MED ORDER — LORAZEPAM 2 MG/ML IJ SOLN
2.0000 mg | INTRAMUSCULAR | Status: DC
  Administered 2024-07-18: 2 mg via INTRAVENOUS
  Filled 2024-07-18: qty 1

## 2024-07-18 MED ORDER — POLYVINYL ALCOHOL 1.4 % OP SOLN: 1.0000 [drp] | Freq: Four times a day (QID) | OPHTHALMIC | Status: DC | PRN

## 2024-07-18 MED ORDER — LORAZEPAM 2 MG/ML IJ SOLN
2.0000 mg | INTRAMUSCULAR | Status: DC | PRN
  Administered 2024-07-18: 2 mg via INTRAVENOUS
  Filled 2024-07-18: qty 1

## 2024-07-18 MED ORDER — BIOTENE DRY MOUTH MT LIQD: 15.0000 mL | OROMUCOSAL | Status: DC | PRN

## 2024-07-18 MED ORDER — FREE WATER: 500.0000 mL | Freq: Once | Status: DC

## 2024-07-18 MED ORDER — HALOPERIDOL LACTATE 2 MG/ML PO CONC: 0.5000 mg | ORAL | Status: DC | PRN

## 2024-07-18 MED ORDER — LORAZEPAM 2 MG/ML IJ SOLN: 0.5000 mg | INTRAMUSCULAR | Status: DC | PRN

## 2024-07-18 MED ORDER — MORPHINE SULFATE (PF) 2 MG/ML IV SOLN
1.0000 mg | INTRAVENOUS | Status: DC | PRN
  Administered 2024-07-18: 1 mg via INTRAVENOUS

## 2024-07-18 MED ORDER — ASPIRIN 81 MG PO CHEW: 81.0000 mg | CHEWABLE_TABLET | ORAL | Status: DC

## 2024-07-18 MED ORDER — HALOPERIDOL LACTATE 5 MG/ML IJ SOLN: 0.5000 mg | INTRAMUSCULAR | Status: DC | PRN

## 2024-07-18 MED ORDER — MORPHINE 100MG IN NS 100ML (1MG/ML) PREMIX INFUSION
2.0000 mg/h | INTRAVENOUS | Status: DC
  Administered 2024-07-18: 2 mg/h via INTRAVENOUS
  Filled 2024-07-18: qty 100

## 2024-07-22 LAB — CULTURE, BLOOD (ROUTINE X 2)
Culture: NO GROWTH
Culture: NO GROWTH
Culture: NO GROWTH
Special Requests: ADEQUATE

## 2024-08-07 NOTE — Progress Notes (Signed)
 Patient had 150 to 160s sustaining HR. Dr. Charlton went at bedside after notified. EKG done as ordered. IV lopressor  given.

## 2024-08-07 NOTE — Progress Notes (Signed)
" °   07-27-24 1222  Spiritual Encounters  Type of Visit Initial  Care provided to: Family  Referral source Nurse (RN/NT/LPN)  Reason for visit Routine spiritual support  OnCall Visit No  Spiritual Framework  Presenting Themes Impactful experiences and emotions  Community/Connection Family  Patient Stress Factors Health changes  Family Stress Factors Exhausted;Health changes  Interventions  Spiritual Care Interventions Made Compassionate presence;Established relationship of care and support;Prayer  Intervention Outcomes  Outcomes Awareness of support;Awareness of health;Reduced anxiety   Chaplain met the Pt's husband in the hallway of 3E, who requested  a word of prayer for his wife. Chaplain offered spiritual and emotional support to the Pt's spouse. The husband expressed gratitude for the visit and the spiritual support provided. Chaplain services remain available for support upon request. "

## 2024-08-07 NOTE — Progress Notes (Signed)
 PCCM Progress Note  89 year old female SNF resident with CAD s/p PCI in 1993, HLD, HTN, HFpEF, hypothyroidism, OA, recent left hip fracture admitted with acute hypoxemic respiratory failure secondary for covid-19. CTA neg for PE with bilateral upper opacties, bilateral pleural effusions R>L. Started on BiPAP, antibiotics, IV diuresis, steroids, nebs and remdesivir . Overnight had new onset AFRVR. This morning worsening status and palliative consulted by primary team. Likely planning to transition to comfort care today.  Pulmonary available as needed.

## 2024-08-07 NOTE — Death Summary Note (Signed)
 "  DEATH SUMMARY   Patient Details  Name: Natasha Harper MRN: [id number] DOB: December 31, 1934 ERE:Ejwndy, Apolinar POUR, MD Admission/Discharge Information   Admit Date:  08/06/2024  Date of Death: Date of Death: August 07, 2024  Time of Death: Time of Death: August 14, 1602  Length of Stay: 1      Hospital Course: Natasha Harper is a 89 y.o female with CAD,  HLD, HTN, HFpEF, hypothyroidism, OA  recent left hip fracture, at the SNF--- presented with shortness of breath, hypoxia satting 78% on room air at the facility.  On presentation was denying having any chest pain, or recent illnesses.  Denies ever having fever or chills, nausea or vomiting. Patient is full code not O2 dependent at baseline   ED Evaluation: 78% on room air Blood pressure 115/60, pulse 95, temperature (!) 96.8 F (36 C), temperature source Axillary, resp. rate (!) 21, last menstrual period 07/07/1997, SpO2 95% on BiPAP   Assessment and Plan: * Acute hypoxic respiratory failure (HCC) due to COVID Afib with RVR -requiring maxed out BiPAP - Discussed with family at bedside and he stated she would not want to be intubated or to have CPR. - Asked palliative to meet with family to discuss further workup including thoracentesis versus comfort care-appreciate consultation and it was decided that patient was to transition to comfort care. - Comfort medications ordered and patient was removed from BiPAP and subsequently passed     The results of significant diagnostics from this hospitalization (including imaging, microbiology, ancillary and laboratory) are listed below for reference.   Significant Diagnostic Studies: ECHOCARDIOGRAM COMPLETE Result Date: 08/06/2024    ECHOCARDIOGRAM REPORT   Patient Name:   Natasha Harper Date of Exam: 08/06/2024 Medical Rec #:  [id number]       Height:       62.0 in Accession #:    [id number]      Weight:       120.0 lb Date of Birth:  [birth year]      BSA:          1.539 m Patient Age:    89 years         BP:           115/80 mmHg Patient Gender: F               HR:           96 bpm. Exam Location:  Inpatient Procedure: 2D Echo, Cardiac Doppler, Color Doppler and Intracardiac            Opacification Agent (Both Spectral and Color Flow Doppler were            utilized during procedure). STAT ECHO Indications:    R06.02 SOB  History:        Patient has prior history of Echocardiogram examinations, most                 recent 07/05/2021. CAD; Risk Factors:Dyslipidemia and                 Hypertension. Covid positiive.  Sonographer:    Ellouise Mose RDCS Referring Phys: LAURICE COY, C  Sonographer Comments: Technically difficult study due to poor echo windows. Patient on bIPAP. Could not communicate due to patient hearing loss and my N95. IMPRESSIONS  1. Left ventricular ejection fraction, by estimation, is 35 to 40%. The left ventricle has moderately decreased function. The left ventricle demonstrates regional wall motion abnormalities (see scoring diagram/findings for description). The left  ventricular internal cavity size was moderately to severely dilated. There is severe concentric left ventricular hypertrophy. Left ventricular diastolic parameters are consistent with Grade II diastolic dysfunction (pseudonormalization).  2. Right ventricular systolic function is mildly reduced. The right ventricular size is mildly enlarged. There is moderately elevated pulmonary artery systolic pressure.  3. Right atrial size was mildly dilated.  4. Moderate pleural effusion in the left lateral region.  5. The mitral valve is degenerative. Mild mitral valve regurgitation. No evidence of mitral stenosis.  6. Tricuspid valve regurgitation is moderate.  7. The aortic valve is tricuspid. Aortic valve regurgitation is not visualized. Aortic valve sclerosis is present, with no evidence of aortic valve stenosis.  8. The inferior vena cava is dilated in size with <50% respiratory variability, suggesting right atrial pressure of 15 mmHg.  Comparison(s): Prior images reviewed side by side. EF is now reduced and there are wall motion abnormalities. Conclusion(s)/Recommendation(s): No left ventricular mural or apical thrombus/thrombi. Would recommend ischemic evaluation. FINDINGS  Left Ventricle: Left ventricular ejection fraction, by estimation, is 35 to 40%. The left ventricle has moderately decreased function. The left ventricle demonstrates regional wall motion abnormalities. Definity  contrast agent was given IV to delineate the left ventricular endocardial borders. The left ventricular internal cavity size was moderately to severely dilated. There is severe concentric left ventricular hypertrophy. Left ventricular diastolic parameters are consistent with Grade II diastolic dysfunction (pseudonormalization).  LV Wall Scoring: The mid and distal anterior wall, entire septum, entire apex, and entire inferior wall are hypokinetic. The antero-lateral wall, posterior wall, and basal anterior segment are normal. Right Ventricle: The right ventricular size is mildly enlarged. No increase in right ventricular wall thickness. Right ventricular systolic function is mildly reduced. There is moderately elevated pulmonary artery systolic pressure. The tricuspid regurgitant velocity is 2.91 m/s, and with an assumed right atrial pressure of 15 mmHg, the estimated right ventricular systolic pressure is 48.9 mmHg. Left Atrium: Left atrial size was normal in size. Right Atrium: Right atrial size was mildly dilated. Pericardium: There is no evidence of pericardial effusion. Mitral Valve: The mitral valve is degenerative in appearance. Mild mitral valve regurgitation. No evidence of mitral valve stenosis. Tricuspid Valve: The tricuspid valve is normal in structure. Tricuspid valve regurgitation is moderate . No evidence of tricuspid stenosis. Aortic Valve: The aortic valve is tricuspid. Aortic valve regurgitation is not visualized. Aortic valve sclerosis is present,  with no evidence of aortic valve stenosis. Pulmonic Valve: The pulmonic valve was normal in structure. Pulmonic valve regurgitation is mild. No evidence of pulmonic stenosis. Aorta: The aortic root and ascending aorta are structurally normal, with no evidence of dilitation. Venous: The right upper pulmonary vein is normal. The inferior vena cava is dilated in size with less than 50% respiratory variability, suggesting right atrial pressure of 15 mmHg. IAS/Shunts: No atrial level shunt detected by color flow Doppler. Additional Comments: There is a moderate pleural effusion in the left lateral region.  LEFT VENTRICLE PLAX 2D LVIDd:         4.50 cm      Diastology LVIDs:         3.60 cm      LV e' medial:    5.87 cm/s LV PW:         1.50 cm      LV E/e' medial:  21.1 LV IVS:        1.10 cm      LV e' lateral:   8.92 cm/s LVOT diam:  2.20 cm      LV E/e' lateral: 13.9 LV SV:         61 LV SV Index:   40 LVOT Area:     3.80 cm  LV Volumes (MOD) LV vol d, MOD A2C: 87.5 ml LV vol d, MOD A4C: 110.0 ml LV vol s, MOD A2C: 59.9 ml LV vol s, MOD A4C: 63.9 ml LV SV MOD A2C:     27.6 ml LV SV MOD A4C:     110.0 ml LV SV MOD BP:      40.1 ml RIGHT VENTRICLE            IVC RV S prime:     9.79 cm/s  IVC diam: 2.30 cm TAPSE (M-mode): 1.2 cm                            PULMONARY VEINS                            Diastolic Velocity: 43.30 cm/s                            S/D Velocity:       1.30                            Systolic Velocity:  57.60 cm/s LEFT ATRIUM           Index        RIGHT ATRIUM           Index LA diam:      3.80 cm 2.47 cm/m   RA Area:     17.80 cm LA Vol (A2C): 37.9 ml 24.63 ml/m  RA Volume:   52.20 ml  33.93 ml/m LA Vol (A4C): 40.5 ml 26.32 ml/m  AORTIC VALVE LVOT Vmax:   93.30 cm/s LVOT Vmean:  62.200 cm/s LVOT VTI:    0.161 m  AORTA Ao Root diam: 2.90 cm Ao Asc diam:  3.10 cm MITRAL VALVE                TRICUSPID VALVE MV Area (PHT): 5.97 cm     TR Peak grad:   33.9 mmHg MV Decel Time: 127 msec     TR  Vmax:        291.00 cm/s MV E velocity: 124.00 cm/s MV A velocity: 137.00 cm/s  SHUNTS MV E/A ratio:  0.91         Systemic VTI:  0.16 m                             Systemic Diam: 2.20 cm Joelle Cedars Tonleu Electronically signed by Joelle Cedars Ny Signature Date/Time: 07/17/2024/2:22:02 PM    Final    DG Chest Port 1 View Result Date: 07/17/2024 EXAM: 1 VIEW(S) XRAY OF THE CHEST 07/17/2024 12:10:00 PM COMPARISON: CTA chest earlier today. CLINICAL HISTORY: 89 year old female with shortness of breath. FINDINGS: LUNGS AND PLEURA: Patchy bilateral airspace opacities. Prominent hilar vasculature. Small bilateral pleural effusions. No pneumothorax. HEART AND MEDIASTINUM: Cardiomegaly. Aortic arch atherosclerosis. BONES AND SOFT TISSUES: Midthoracic vertebroplasty noted. No acute osseous abnormality. IMPRESSION: 1. Bilateral pneumonia and pleural effusions; ventilation stable from CTA chest today. Electronically signed by: Helayne Hurst MD MD 07/17/2024 12:51 PM EST RP  Workstation: [id number]   CT Angio Chest PE W and/or Wo Contrast Result Date: 07/17/2024 EXAM: CTA of the Chest with contrast for PE 07/17/2024 11:54:17 AM TECHNIQUE: CTA of the chest was performed after the administration of 75 mL of iohexol  (OMNIPAQUE ) 350 MG/ML injection. Multiplanar reformatted images are provided for review. MIP images are provided for review. Automated exposure control, iterative reconstruction, and/or weight based adjustment of the mA/kV was utilized to reduce the radiation dose to as low as reasonably achievable. COMPARISON: Prior chest CTA 07/05/2021, prior chest CT 10/05/2023 . CLINICAL HISTORY: 89 year old female. Pulmonary embolism suspected, low to intermediate probability, negative D-dimer. Recent hip replacement, increasing SOB. FINDINGS: PULMONARY ARTERIES: Excellent pulmonary artery contrast timing. No pulmonary artery filling defect. Main pulmonary artery is normal in caliber. MEDIASTINUM: Stable cardiomegaly. No  pericardial effusion. Advanced calcified atherosclerosis of the coronary arteries and visible thoracic aorta. LYMPH NODES: No mediastinal, hilar or axillary lymphadenopathy. LUNGS AND PLEURA: Moderate to large layering right pleural effusion with simple fluid density favoring transudate. Smaller layering left pleural effusion with compressive left lower lobe atelectasis. Less pronounced right lower lobe atelectasis. Superimposed confluent bilateral upper lobes peribronchial opacity ranging from ground glass to solid, but little upper lobe consolidation at this time. This most resembles bilateral infection / inflammation. Major airways remain patent. No pneumothorax. UPPER ABDOMEN: Limited images of the upper abdomen demonstrate severe calcified aortic atherosclerosis with evidence of hemodynamically significant aortic stenosis on series 4 image 130. SOFT TISSUES AND BONES: Generalized osteopenia. Chronic left 2nd through 4th rib fractures are stable. Widespread chronic spinal compression fractures, 1 previously augmented. Stable chronic sternal fracture. Mild respiratory motion. IMPRESSION: 1. No pulmonary embolism. 2. Bilateral upper lobe opacities, favor bilateral pneumonia and possible viral/atypical etiology. Superimposed moderate to large right and smaller left pleural effusions with lower lobe atelectasis. 3. Severe calcified aortic atherosclerosis with hemodynamically significant abdominal aortic stenosis suspected. 4. Osteopenia and chronically advanced spinal compression fractures, chronic rib fractures. Electronically signed by: Helayne Hurst MD MD 07/17/2024 12:16 PM EST RP Workstation: [id number]    Microbiology: Recent Results (from the past 240 hours)  Culture, blood (routine x 2)     Status: None (Preliminary result)   Collection Time: 07/17/24 11:18 AM   Specimen: BLOOD  Result Value Ref Range Status   Specimen Description BLOOD RIGHT ANTECUBITAL  Final   Special Requests   Final    BOTTLES  DRAWN AEROBIC AND ANAEROBIC Blood Culture adequate volume   Culture   Final    NO GROWTH 2 DAYS Performed at [facility], [address], [place], [place] [address]    Report Status PENDING  Incomplete  Resp panel by RT-PCR (RSV, Flu A&B, Covid) Peripheral     Status: Abnormal   Collection Time: 07/17/24 11:19 AM   Specimen: Peripheral; Nasal Swab  Result Value Ref Range Status   SARS Coronavirus 2 by RT PCR POSITIVE (A) NEGATIVE Final   Influenza A by PCR NEGATIVE NEGATIVE Final   Influenza B by PCR NEGATIVE NEGATIVE Final    Comment: (NOTE) The Xpert Xpress SARS-CoV-2/FLU/RSV plus assay is intended as an aid in the diagnosis of influenza from Nasopharyngeal swab specimens and should not be used as a sole basis for treatment. Nasal washings and aspirates are unacceptable for Xpert Xpress SARS-CoV-2/FLU/RSV testing.  Fact Sheet for Patients: bloggercourse.com  Fact Sheet for Healthcare Providers: seriousbroker.it  This test is not yet approved or cleared by the [place]  FDA and has been authorized for detection and/or diagnosis  of SARS-CoV-2 by FDA under an Emergency Use Authorization (EUA). This EUA will remain in effect (meaning this test can be used) for the duration of the COVID-19 declaration under Section 564(b)(1) of the Act, 21 U.S.C. section 360bbb-3(b)(1), unless the authorization is terminated or revoked.     Resp Syncytial Virus by PCR NEGATIVE NEGATIVE Final    Comment: (NOTE) Fact Sheet for Patients: bloggercourse.com  Fact Sheet for Healthcare Providers: seriousbroker.it  This test is not yet approved or cleared by the [place]  FDA and has been authorized for detection and/or diagnosis of SARS-CoV-2 by FDA under an Emergency Use Authorization (EUA). This EUA will remain in effect (meaning this test can be used) for the duration of  the COVID-19 declaration under Section 564(b)(1) of the Act, 21 U.S.C. section 360bbb-3(b)(1), unless the authorization is terminated or revoked.  Performed at [facility], [address] [place], [place] [address]   MRSA Next Gen by PCR, Nasal     Status: None   Collection Time: 07/17/24  1:40 PM   Specimen: Nasal Mucosa; Nasal Swab  Result Value Ref Range Status   MRSA by PCR Next Gen NOT DETECTED NOT DETECTED Final    Comment: (NOTE) The GeneXpert MRSA Assay (FDA approved for NASAL specimens only), is one component of a comprehensive MRSA colonization surveillance program. It is not intended to diagnose MRSA infection nor to guide or monitor treatment for MRSA infections. Test performance is not FDA approved in patients less than 68 years old. Performed at [facility], [address], [place], [place] [address]   Culture, blood (x 2)     Status: None (Preliminary result)   Collection Time: 07/17/24  6:28 PM   Specimen: BLOOD  Result Value Ref Range Status   Specimen Description BLOOD BLOOD LEFT ARM  Final   Special Requests   Final    BOTTLES DRAWN AEROBIC AND ANAEROBIC Blood Culture results may not be optimal due to an inadequate volume of blood received in culture bottles   Culture   Final    NO GROWTH 2 DAYS Performed at [facility], [address], [place], [place] [address]    Report Status PENDING  Incomplete  Culture, blood (x 2)     Status: None (Preliminary result)   Collection Time: 07/17/24  6:29 PM   Specimen: BLOOD  Result Value Ref Range Status   Specimen Description BLOOD BLOOD LEFT HAND  Final   Special Requests   Final    BOTTLES DRAWN AEROBIC AND ANAEROBIC Blood Culture results may not be optimal due to an inadequate volume of blood received in culture bottles   Culture   Final    NO GROWTH 2 DAYS Performed at [facility], [address] [place], [place] [address]    Report Status PENDING  Incomplete    Time spent: 35  minutes  Signed: Amaziah Ghosh U Mekiyah Gladwell, DO 07/19/2024   "

## 2024-08-07 NOTE — Progress Notes (Signed)
 PHARMACY - ANTICOAGULATION CONSULT NOTE  Pharmacy Consult for heparin  Indication: chest pain/ACS  Allergies[1]  Patient Measurements: Height: 5' (152.4 cm) Weight: 56.8 kg (125 lb 3.5 oz) IBW/kg (Calculated) : 45.5  Vital Signs: Temp: 98.4 F (36.9 C) 08/15/24 0721) Temp Source: Axillary August 15, 2024 0721) BP: 108/75 08-15-2024 0810) Pulse Rate: 117 08/15/24 0721)  Labs: Recent Labs    07/17/24 1119 07/17/24 1135 07/17/24 1136 07/17/24 1619 07/17/24 2159 08/15/2024 0237  HGB 8.7* 13.3 12.9  --   --  8.2*  HCT 27.0* 39.0 38.0  --   --  25.6*  PLT 270  --   --   --   --  260  APTT  --   --   --   --  83* 85*  LABPROT  --   --   --  20.4*  --  20.3*  INR  --   --   --  1.7*  --  1.6*  HEPARINUNFRC  --   --   --   --  >1.10* >1.10*  CREATININE 0.83 0.90  --   --   --  0.73    Estimated Creatinine Clearance: 39.1 mL/min (by C-G formula based on SCr of 0.73 mg/dL).   Medical History: Past Medical History:  Diagnosis Date   CAD (coronary artery disease)    PCI to RCA and diagonal in remote past, residual 70% LAD  /   nuclear, 2007, no ischemia   Carotid arterial disease    Doppler, June, 2011, stable, 60-79% R. ICA, 40-59% LICA   Compression fracture of thoracic vertebra, closed, initial encounter (HCC) 04/13/2021   Contact lens/glasses fitting    wears contacts or glasses   Dyslipidemia    Significant drop in LDL from Lipitor  even though LDL remains high   Ejection fraction    EF 65%, nuclear, 2007   Fibrocystic breast    Hip fracture (HCC) 09/05/2021   HTN (hypertension)     no med   Hypercholesterolemia    Hypothyroidism    Thyroid  surgery in the past, thyroid  nodules followed by Dr.Ellison   Lump or mass in breast 07/19/2007   Excised 01/12/13. B9 on pathQualifier: Diagnosis of  By: Charlett MD, Apolinar POUR     Osteoporosis    Prominent abdominal aortic pulsation    No abdominal aneurysm by ultrasound   Rectal fissure    Sacral fracture, closed (HCC) 04/13/2021      Assessment: 70 YOF presenting with respiratory distress, elevated troponin, she is s/p ORIF on eliquis  2.5mg  BID for DVT ppx post-op. Heparin  infusion started for ACS.  Heparin  level >1.1 due to apixaban  use, aPTT is therapeutic at 85 seconds. H/H low but stable, pltc wnl.  Goal of Therapy:  Heparin  level 0.3-0.7 units/ml aPTT 66-102 seconds Monitor platelets by anticoagulation protocol: Yes   Plan:  Heparin  700 units/hr Daily heparin  level, APTT, CBC   Natasha Harper, PharmD, BCPS, [facility] Clinical Pharmacist [phone] Please check AMION for all [facility] Pharmacy numbers 2024/08/15     [1]  Allergies Allergen Reactions   Actonel [Risedronate Sodium] Other (See Comments)    Bloating   Evista [Raloxifene] Other (See Comments)    Abdominal cramps   Fosamax [Alendronate Sodium] Other (See Comments)    Abdominal cramps

## 2024-08-07 NOTE — Consult Note (Signed)
 "                                                                                   Consultation Note Date: 08-08-2024   Patient Name: Natasha Harper  DOB: [birth year]  MRN: [id number]  Age / Sex: 89 y.o., female  PCP: Charlett Apolinar POUR, MD Referring Physician: Juvenal Harlene PENNER, DO  Reason for Consultation: Establishing goals of care  HPI/Patient Profile: 89 y.o. female  with past medical history of HFpEF, CAD, HTN, HLD, hypothyroidism, osteoarthritis, recent left hip fracture s/p ORIF 12/11 from [facility] admitted on 07/17/2024 with respiratory distress due to +COVID pneumonia, acute on chronic heart failure exacerbation, right pleural effusion requiring BiPAP support. 1/11 ECHO EF 35-40%, G2DD. 08/08/2024 new rapid atrial fibrillation and concern for NSTEMI (cath deferred due to severity of condition). Palliative care consulted to assist with goals of care and symptom management.    Clinical Assessment and Goals of Care: Consult received and chart review completed. I discussed with Dr. Vann. I met today at bedside with husband, Maude. Maude and I review [name]'s current illness and support. Maude shares that her health has been declining for a few years now and she has very poor quality of life. He shares that she has been telling him to let her go. Peter and I discussed options moving forward with continuing care and treatment vs transition to comfort care. Maude tells me that he feels we need to let her go and focus on comfort care. We discussed process of transition to comfort care, utilizing medication to ensure relief from shortness of breath, anticipating she will be sedated on comfort medication, and removing BiPAP so we are not prolonging her suffering. He agrees with plan discussed. They have 3 children but only one local - Miriam. He reports that he does not feel we need to wait for family to come to bedside. I encouraged that he speak with Eva to allow her to come to bedside if desired as a support  for him as well. He will call and discuss with Eva and let me know timing of transition to full comfort care.   Update: Maude shares that his daughter, Eva, is coming to bedside soon. Her desires comfort medication to be provided for his wife in the meantime.   Update: I returned and spoke with Eva and Maude. They both agree with transition to comfort care and wish to start process now. We discussed morphine  infusion and expectations. Eva shares that this is a quick decline but not unexpected.   Update: I returned to bedside along with RN and RT. Comfort medications have been given and we are proceeding with transition off BiPAP. I discussed with family expectations of what they will see and breathing patterns at end of life. I reassured them that the medication we have provided for pain, breathing, and anxiety will ensure she is not feeling anything or struggling. I remained at bedside during transition. Provided morphine  bolus as indicated and RN provided another dose of Ativan . Symptoms managed well at this time and support provided to family. I gave them some time with her alone.   Later notified by RN  of time of death. I came to provide support to family but they have just left.   Primary Decision Maker NEXT OF KIN husband Maude    SUMMARY OF RECOMMENDATIONS   - DNR - Transition to full comfort care - Hospital death anticipated.   Code Status/Advance Care Planning: DNR   Symptom Management:  Morphine  infusion and PRN bolus, Ativan  PRN, and Robinul  ordered for comfort.   Palliative Prophylaxis:  Frequent Pain Assessment  Additional Recommendations (Limitations, Scope, Preferences): Full Comfort Care  Psycho-social/Spiritual:  Desire for further Chaplaincy support:no Additional Recommendations: Grief/Bereavement Support  Prognosis:  Likely hours or less off BiPAP.   Discharge Planning: Anticipated Hospital Death      Primary Diagnoses: Present on  Admission:  Acute hypoxic respiratory failure (HCC)  Sepsis (HCC)  (HFpEF) heart failure with preserved ejection fraction (HCC)  Elevated troponin  CAD (coronary artery disease)  Femoral fracture (HCC)  Hyperlipidemia  Hypothyroidism  Macrocytic anemia  Primary hypertension  Thoracic compression fracture (HCC)  SARS-CoV-2 positive   I have reviewed the medical record, interviewed the patient and family, and examined the patient. The following aspects are pertinent.  Past Medical History:  Diagnosis Date   CAD (coronary artery disease)    PCI to RCA and diagonal in remote past, residual 70% LAD  /   nuclear, 2007, no ischemia   Carotid arterial disease    Doppler, June, 2011, stable, 60-79% R. ICA, 40-59% LICA   Compression fracture of thoracic vertebra, closed, initial encounter (HCC) 04/13/2021   Contact lens/glasses fitting    wears contacts or glasses   Dyslipidemia    Significant drop in LDL from Lipitor  even though LDL remains high   Ejection fraction    EF 65%, nuclear, 2007   Fibrocystic breast    Hip fracture (HCC) 09/05/2021   HTN (hypertension)     no med   Hypercholesterolemia    Hypothyroidism    Thyroid  surgery in the past, thyroid  nodules followed by Dr.Ellison   Lump or mass in breast 07/19/2007   Excised 01/12/13. B9 on pathQualifier: Diagnosis of  By: Charlett MD, Apolinar POUR     Osteoporosis    Prominent abdominal aortic pulsation    No abdominal aneurysm by ultrasound   Rectal fissure    Sacral fracture, closed (HCC) 04/13/2021   Social History   Socioeconomic History   Marital status: Married    Spouse name: Not on file   Number of children: Not on file   Years of education: Not on file   Highest education level: Not on file  Occupational History   Not on file  Tobacco Use   Smoking status: Former    Current packs/day: 0.00    Types: Cigarettes    Quit date: 07/07/1958    Years since quitting: 66.0   Smokeless tobacco: Never  Vaping Use    Vaping status: Never Used  Substance and Sexual Activity   Alcohol  use: No   Drug use: No   Sexual activity: Yes    Partners: Male  Other Topics Concern   Not on file  Social History Narrative   HH     Of 2  No pets    Family from [place]    Exercise   Walks every day at least 30 minutes  2 miles .   Sleep  Ok 10 - 7    Just moved to [place] [facility]   Tobacco Use: Medium Risk (07/17/2024)  Patient History    Smoking Tobacco Use: Former    Smokeless Tobacco Use: Never    Passive Exposure: Not on Actuary Strain: Not on file  Food Insecurity: No Food Insecurity (07/17/2024)   Epic    Worried About Programme Researcher, Broadcasting/film/video in the Last Year: Never true    Ran Out of Food in the Last Year: Never true  Transportation Needs: No Transportation Needs (07/17/2024)   Epic    Lack of Transportation (Medical): No    Lack of Transportation (Non-Medical): No  Physical Activity: Not on file  Stress: Not on file  Social Connections: Socially Isolated (07/17/2024)   Social Connection and Isolation Panel    Frequency of Communication with Friends and Family: Once a week    Frequency of Social Gatherings with Friends and Family: Once a week    Attends Religious Services: Never    Database Administrator or Organizations: No    Attends Banker Meetings: Never    Marital Status: Married  Depression (PHQ2-9): Low Risk (01/28/2022)   Depression (PHQ2-9)    PHQ-2 Score: 3  Alcohol  Screen: Not on file  Housing: Low Risk (07/17/2024)   Epic    Unable to Pay for Housing in the Last Year: No    Number of Times Moved in the Last Year: 0    Homeless in the Last Year: No  Utilities: Not At Risk (07/17/2024)   Epic    Threatened with loss of utilities: No  Health Literacy: Not on file   Family History  Problem Relation Age of Onset   Heart attack Father    Osteoporosis Mother    Breast cancer Daughter    Scheduled Meds:  ascorbic acid   500 mg  Oral Daily   aspirin   324 mg Oral Once   aspirin   81 mg Oral Pre-Cath   atorvastatin   80 mg Oral Daily   chlorpheniramine-HYDROcodone   5 mL Oral Q12H   docusate sodium   100 mg Oral BID   free water   500 mL Oral Once   furosemide   40 mg Intravenous BID AC   guaiFENesin   10 mL Oral Q8H   ipratropium  0.5 mg Nebulization Q6H   lactobacillus  1 g Oral TID WC   levalbuterol   1.25 mg Nebulization Q6H   levothyroxine   50 mcg Oral Daily   methylPREDNISolone  (SOLU-MEDROL ) injection  40 mg Intravenous Q12H   sodium chloride  flush  3 mL Intravenous Q12H   sodium chloride  flush  3 mL Intravenous Q12H   zinc  sulfate (50mg  elemental zinc )  220 mg Oral Daily   Continuous Infusions:  amiodarone  60 mg/hr (07-23-24 0834)   Followed by   amiodarone      ceFEPime  (MAXIPIME ) IV Stopped (07-23-24 0051)   heparin  700 Units/hr (07/17/24 1320)   remdesivir  100 mg in sodium chloride  0.9 % 100 mL IVPB 100 mg (07/23/24 0934)   vancomycin      PRN Meds:.acetaminophen  **OR** acetaminophen , hydrALAZINE , HYDROmorphone  (DILAUDID ) injection, methocarbamol , metoprolol  tartrate, ondansetron  **OR** ondansetron  (ZOFRAN ) IV, oxyCODONE , senna-docusate, traZODone  Allergies[1] Review of Systems  Unable to perform ROS: Acuity of condition    Physical Exam Vitals and nursing note reviewed.  Constitutional:      Appearance: She is ill-appearing and toxic-appearing.     Comments: BiPAP  Cardiovascular:     Rate and Rhythm: Normal rate. Rhythm irregularly irregular.  Pulmonary:     Effort: Accessory muscle usage present.     Comments: Full support BiPAP Abdominal:  Palpations: Abdomen is soft.  Neurological:     Comments: Opens eyes intermittently     Vital Signs: BP 121/76 (BP Location: Left Arm)   Pulse 90   Temp 98.4 F (36.9 C) (Axillary)   Resp (!) 28   Ht 5' (1.524 m)   Wt 56.8 kg   LMP 07/07/1997   SpO2 98%   BMI 24.46 kg/m  Pain Scale: Faces       SpO2: SpO2: 98 % O2 Device:SpO2: 98  % O2 Flow Rate: .   IO: Intake/output summary:  Intake/Output Summary (Last 24 hours) at 08/07/24 0940 Last data filed at 2024-08-07 9375 Gross per 24 hour  Intake 480.73 ml  Output 721 ml  Net -240.27 ml    LBM: Last BM Date :  (PTA) Baseline Weight: Weight: 59.3 kg Most recent weight: Weight: 56.8 kg      Time Total: 150 min  Signed by: Bernarda Kitty, NP Palliative Medicine Team Pager # [phone] (M-F 8a-5p) Team Phone # [phone] (Nights/Weekends)                     [1]  Allergies Allergen Reactions   Actonel [Risedronate Sodium] Other (See Comments)    Bloating   Evista [Raloxifene] Other (See Comments)    Abdominal cramps   Fosamax [Alendronate Sodium] Other (See Comments)    Abdominal cramps   "

## 2024-08-07 NOTE — Progress Notes (Signed)
 Pt removed from bipap at this time to 2L  for comfort. Rt will continue to monitor as needed.

## 2024-08-07 NOTE — Progress Notes (Signed)
 Wasted IV Morphine  100mg /115mL - the total remaining waste was 60mL. 2nd verified by Doyal Formica, RN.

## 2024-08-07 NOTE — Progress Notes (Signed)
 " PROGRESS NOTE    Natasha Harper  FMW:[id number] DOB: 09/06/1934 DOA: 07/17/2024 PCP: Charlett Apolinar POUR, MD    Brief Narrative:  Natasha Harper is a 89 y.o female with CAD,  HLD, HTN, HFpEF, hypothyroidism, OA  recent left hip fracture, at the SNF--- presented with shortness of breath, hypoxia satting 78% on room air at the facility. After discussion with family and palliative care, plan is to transition to comfort care as patient has been telling her husband that this is no way to live.  Assessment and Plan: * Acute hypoxic respiratory failure (HCC) due to COVID Afib with RVR Acute respiratory failure-requiring maxed out BiPAP - Discussed with family at bedside and he stated she would not want to be intubated or to have CPR. - Asked palliative to meet with family to discuss further workup including thoracentesis versus comfort care-appreciate consultation and it was decided that patient was to transition to comfort care. - Comfort medications will be ordered once family arrives   DVT prophylaxis: Place TED hose Start: 07/17/24 1319 SCDs Start: 07/17/24 1318    Code Status: Limited: Do not attempt resuscitation (DNR) -DNR-LIMITED -Do Not Intubate/DNI  Family Communication: Husband at bedside-offered to call daughter but he declined  Disposition Plan:  Level of care: Progressive Status is: Inpatient     Consultants:  Cardiology Palliative care  Subjective: Patient on BiPAP max oxygenation  Objective: Vitals:   08-07-2024 0900 08-07-2024 0930 07-Aug-2024 1045 07-Aug-2024 1102  BP: 104/76 121/76 122/66   Pulse: (!) 130 90 90   Resp: (!) 30 (!) 28 (!) 30 (!) 31  Temp:   98.4 F (36.9 C)   TempSrc:   Axillary   SpO2: 95% 98% 98%   Weight:      Height:        Intake/Output Summary (Last 24 hours) at Aug 07, 2024 1217 Last data filed at 08/07/24 9375 Gross per 24 hour  Intake 480.73 ml  Output 721 ml  Net -240.27 ml   Filed Weights   07/17/24 1703 08-07-2024 0500  Weight:  59.3 kg 56.8 kg    Examination:   Anxious appearing, on BiPAP Diminished breath sounds Irregular heart rate   Data Reviewed: I have personally reviewed following labs and imaging studies  CBC: Recent Labs  Lab 07/17/24 1119 07/17/24 1135 07/17/24 1136 07-Aug-2024 0237  WBC 7.0  --   --  8.0  NEUTROABS 6.7  --   --  7.5  HGB 8.7* 13.3 12.9 8.2*  HCT 27.0* 39.0 38.0 25.6*  MCV 110.2*  --   --  107.6*  PLT 270  --   --  260   Basic Metabolic Panel: Recent Labs  Lab 07/17/24 1119 07/17/24 1135 07/17/24 1136 07/17/24 1619 08/07/2024 0237  NA 136 138 138  --  141  K 4.7 4.6 4.6  --  3.8  CL 101 105  --   --  106  CO2 21*  --   --   --  21*  GLUCOSE 127* 123*  --   --  103*  BUN 42* 43*  --   --  40*  CREATININE 0.83 0.90  --   --  0.73  CALCIUM  9.2  --   --   --  8.7*  MG  --   --   --  2.1  --   PHOS  --   --   --  3.8  --    GFR: Estimated Creatinine Clearance: 39.1 mL/min (by  C-G formula based on SCr of 0.73 mg/dL). Liver Function Tests: Recent Labs  Lab 07/17/24 1119 08-09-2024 0237  AST 137* 112*  ALT 27 24  ALKPHOS 136* 118  BILITOT 0.4 0.3  PROT 6.6 6.0*  ALBUMIN 3.5 3.1*   No results for input(s): LIPASE, AMYLASE in the last 168 hours. No results for input(s): AMMONIA in the last 168 hours. Coagulation Profile: Recent Labs  Lab 07/17/24 1619 2024-08-09 0237  INR 1.7* 1.6*   Cardiac Enzymes: No results for input(s): CKTOTAL, CKMB, CKMBINDEX, TROPONINI in the last 168 hours. BNP (last 3 results) Recent Labs    07/17/24 1119 09-Aug-2024 0237  PROBNP 28,563.0* 24,182.0*   HbA1C: No results for input(s): HGBA1C in the last 72 hours. CBG: No results for input(s): GLUCAP in the last 168 hours. Lipid Profile: No results for input(s): CHOL, HDL, LDLCALC, TRIG, CHOLHDL, LDLDIRECT in the last 72 hours. Thyroid  Function Tests: No results for input(s): TSH, T4TOTAL, FREET4, T3FREE, THYROIDAB in the last 72  hours. Anemia Panel: No results for input(s): VITAMINB12, FOLATE, FERRITIN, TIBC, IRON, RETICCTPCT in the last 72 hours. Sepsis Labs: Recent Labs  Lab 07/17/24 1136 07/17/24 1304 07/17/24 1327  PROCALCITON  --  0.30  --   LATICACIDVEN 1.6  --  1.9    Recent Results (from the past 240 hours)  Culture, blood (routine x 2)     Status: None (Preliminary result)   Collection Time: 07/17/24 11:18 AM   Specimen: BLOOD  Result Value Ref Range Status   Specimen Description BLOOD RIGHT ANTECUBITAL  Final   Special Requests   Final    BOTTLES DRAWN AEROBIC AND ANAEROBIC Blood Culture adequate volume   Culture   Final    NO GROWTH < 24 HOURS Performed at [facility], [address], [place], [place] [address]    Report Status PENDING  Incomplete  Resp panel by RT-PCR (RSV, Flu A&B, Covid) Peripheral     Status: Abnormal   Collection Time: 07/17/24 11:19 AM   Specimen: Peripheral; Nasal Swab  Result Value Ref Range Status   SARS Coronavirus 2 by RT PCR POSITIVE (A) NEGATIVE Final   Influenza A by PCR NEGATIVE NEGATIVE Final   Influenza B by PCR NEGATIVE NEGATIVE Final    Comment: (NOTE) The Xpert Xpress SARS-CoV-2/FLU/RSV plus assay is intended as an aid in the diagnosis of influenza from Nasopharyngeal swab specimens and should not be used as a sole basis for treatment. Nasal washings and aspirates are unacceptable for Xpert Xpress SARS-CoV-2/FLU/RSV testing.  Fact Sheet for Patients: bloggercourse.com  Fact Sheet for Healthcare Providers: seriousbroker.it  This test is not yet approved or cleared by the [place]  FDA and has been authorized for detection and/or diagnosis of SARS-CoV-2 by FDA under an Emergency Use Authorization (EUA). This EUA will remain in effect (meaning this test can be used) for the duration of the COVID-19 declaration under Section 564(b)(1) of the Act, 21 U.S.C. section  360bbb-3(b)(1), unless the authorization is terminated or revoked.     Resp Syncytial Virus by PCR NEGATIVE NEGATIVE Final    Comment: (NOTE) Fact Sheet for Patients: bloggercourse.com  Fact Sheet for Healthcare Providers: seriousbroker.it  This test is not yet approved or cleared by the [place]  FDA and has been authorized for detection and/or diagnosis of SARS-CoV-2 by FDA under an Emergency Use Authorization (EUA). This EUA will remain in effect (meaning this test can be used) for the duration of the COVID-19 declaration under Section 564(b)(1) of  the Act, 21 U.S.C. section 360bbb-3(b)(1), unless the authorization is terminated or revoked.  Performed at [facility], [address], [place], [place] [address]   MRSA Next Gen by PCR, Nasal     Status: None   Collection Time: 07/17/24  1:40 PM   Specimen: Nasal Mucosa; Nasal Swab  Result Value Ref Range Status   MRSA by PCR Next Gen NOT DETECTED NOT DETECTED Final    Comment: (NOTE) The GeneXpert MRSA Assay (FDA approved for NASAL specimens only), is one component of a comprehensive MRSA colonization surveillance program. It is not intended to diagnose MRSA infection nor to guide or monitor treatment for MRSA infections. Test performance is not FDA approved in patients less than 61 years old. Performed at [facility], [address], [place], [place] [address]   Culture, blood (x 2)     Status: None (Preliminary result)   Collection Time: 07/17/24  6:28 PM   Specimen: BLOOD  Result Value Ref Range Status   Specimen Description BLOOD BLOOD LEFT ARM  Final   Special Requests   Final    BOTTLES DRAWN AEROBIC AND ANAEROBIC Blood Culture results may not be optimal due to an inadequate volume of blood received in culture bottles   Culture   Final    NO GROWTH < 24 HOURS Performed at [facility], [address] [place], [place] [address]    Report  Status PENDING  Incomplete  Culture, blood (x 2)     Status: None (Preliminary result)   Collection Time: 07/17/24  6:29 PM   Specimen: BLOOD  Result Value Ref Range Status   Specimen Description BLOOD BLOOD LEFT HAND  Final   Special Requests   Final    BOTTLES DRAWN AEROBIC AND ANAEROBIC Blood Culture results may not be optimal due to an inadequate volume of blood received in culture bottles   Culture   Final    NO GROWTH < 24 HOURS Performed at [facility], [address], [place], [place] [address]    Report Status PENDING  Incomplete         Radiology Studies: ECHOCARDIOGRAM COMPLETE Result Date: 07/17/2024    ECHOCARDIOGRAM REPORT   Patient Name:   AILEEN AMORE Date of Exam: 07/17/2024 Medical Rec #:  [id number]       Height:       62.0 in Accession #:    [id number]      Weight:       120.0 lb Date of Birth:  02-04-1935      BSA:          1.539 m Patient Age:    89 years        BP:           115/80 mmHg Patient Gender: F               HR:           96 bpm. Exam Location:  Inpatient Procedure: 2D Echo, Cardiac Doppler, Color Doppler and Intracardiac            Opacification Agent (Both Spectral and Color Flow Doppler were            utilized during procedure). STAT ECHO Indications:    R06.02 SOB  History:        Patient has prior history of Echocardiogram examinations, most  recent 07/05/2021. CAD; Risk Factors:Dyslipidemia and                 Hypertension. Covid positiive.  Sonographer:    Ellouise Mose RDCS Referring Phys: LAURICE COY, C  Sonographer Comments: Technically difficult study due to poor echo windows. Patient on bIPAP. Could not communicate due to patient hearing loss and my N95. IMPRESSIONS  1. Left ventricular ejection fraction, by estimation, is 35 to 40%. The left ventricle has moderately decreased function. The left ventricle demonstrates regional wall motion abnormalities (see scoring diagram/findings for description). The left ventricular  internal cavity size was moderately to severely dilated. There is severe concentric left ventricular hypertrophy. Left ventricular diastolic parameters are consistent with Grade II diastolic dysfunction (pseudonormalization).  2. Right ventricular systolic function is mildly reduced. The right ventricular size is mildly enlarged. There is moderately elevated pulmonary artery systolic pressure.  3. Right atrial size was mildly dilated.  4. Moderate pleural effusion in the left lateral region.  5. The mitral valve is degenerative. Mild mitral valve regurgitation. No evidence of mitral stenosis.  6. Tricuspid valve regurgitation is moderate.  7. The aortic valve is tricuspid. Aortic valve regurgitation is not visualized. Aortic valve sclerosis is present, with no evidence of aortic valve stenosis.  8. The inferior vena cava is dilated in size with <50% respiratory variability, suggesting right atrial pressure of 15 mmHg. Comparison(s): Prior images reviewed side by side. EF is now reduced and there are wall motion abnormalities. Conclusion(s)/Recommendation(s): No left ventricular mural or apical thrombus/thrombi. Would recommend ischemic evaluation. FINDINGS  Left Ventricle: Left ventricular ejection fraction, by estimation, is 35 to 40%. The left ventricle has moderately decreased function. The left ventricle demonstrates regional wall motion abnormalities. Definity  contrast agent was given IV to delineate the left ventricular endocardial borders. The left ventricular internal cavity size was moderately to severely dilated. There is severe concentric left ventricular hypertrophy. Left ventricular diastolic parameters are consistent with Grade II diastolic dysfunction (pseudonormalization).  LV Wall Scoring: The mid and distal anterior wall, entire septum, entire apex, and entire inferior wall are hypokinetic. The antero-lateral wall, posterior wall, and basal anterior segment are normal. Right Ventricle: The right  ventricular size is mildly enlarged. No increase in right ventricular wall thickness. Right ventricular systolic function is mildly reduced. There is moderately elevated pulmonary artery systolic pressure. The tricuspid regurgitant velocity is 2.91 m/s, and with an assumed right atrial pressure of 15 mmHg, the estimated right ventricular systolic pressure is 48.9 mmHg. Left Atrium: Left atrial size was normal in size. Right Atrium: Right atrial size was mildly dilated. Pericardium: There is no evidence of pericardial effusion. Mitral Valve: The mitral valve is degenerative in appearance. Mild mitral valve regurgitation. No evidence of mitral valve stenosis. Tricuspid Valve: The tricuspid valve is normal in structure. Tricuspid valve regurgitation is moderate . No evidence of tricuspid stenosis. Aortic Valve: The aortic valve is tricuspid. Aortic valve regurgitation is not visualized. Aortic valve sclerosis is present, with no evidence of aortic valve stenosis. Pulmonic Valve: The pulmonic valve was normal in structure. Pulmonic valve regurgitation is mild. No evidence of pulmonic stenosis. Aorta: The aortic root and ascending aorta are structurally normal, with no evidence of dilitation. Venous: The right upper pulmonary vein is normal. The inferior vena cava is dilated in size with less than 50% respiratory variability, suggesting right atrial pressure of 15 mmHg. IAS/Shunts: No atrial level shunt detected by color flow Doppler. Additional Comments: There is a moderate pleural effusion in the  left lateral region.  LEFT VENTRICLE PLAX 2D LVIDd:         4.50 cm      Diastology LVIDs:         3.60 cm      LV e' medial:    5.87 cm/s LV PW:         1.50 cm      LV E/e' medial:  21.1 LV IVS:        1.10 cm      LV e' lateral:   8.92 cm/s LVOT diam:     2.20 cm      LV E/e' lateral: 13.9 LV SV:         61 LV SV Index:   40 LVOT Area:     3.80 cm  LV Volumes (MOD) LV vol d, MOD A2C: 87.5 ml LV vol d, MOD A4C: 110.0 ml LV  vol s, MOD A2C: 59.9 ml LV vol s, MOD A4C: 63.9 ml LV SV MOD A2C:     27.6 ml LV SV MOD A4C:     110.0 ml LV SV MOD BP:      40.1 ml RIGHT VENTRICLE            IVC RV S prime:     9.79 cm/s  IVC diam: 2.30 cm TAPSE (M-mode): 1.2 cm                            PULMONARY VEINS                            Diastolic Velocity: 43.30 cm/s                            S/D Velocity:       1.30                            Systolic Velocity:  57.60 cm/s LEFT ATRIUM           Index        RIGHT ATRIUM           Index LA diam:      3.80 cm 2.47 cm/m   RA Area:     17.80 cm LA Vol (A2C): 37.9 ml 24.63 ml/m  RA Volume:   52.20 ml  33.93 ml/m LA Vol (A4C): 40.5 ml 26.32 ml/m  AORTIC VALVE LVOT Vmax:   93.30 cm/s LVOT Vmean:  62.200 cm/s LVOT VTI:    0.161 m  AORTA Ao Root diam: 2.90 cm Ao Asc diam:  3.10 cm MITRAL VALVE                TRICUSPID VALVE MV Area (PHT): 5.97 cm     TR Peak grad:   33.9 mmHg MV Decel Time: 127 msec     TR Vmax:        291.00 cm/s MV E velocity: 124.00 cm/s MV A velocity: 137.00 cm/s  SHUNTS MV E/A ratio:  0.91         Systemic VTI:  0.16 m                             Systemic Diam: 2.20 cm Joelle Cedars Tonleu Electronically signed by Joelle Cedars Tonleu Signature Date/Time:  07/17/2024/2:22:02 PM    Final    DG Chest Port 1 View Result Date: 07/17/2024 EXAM: 1 VIEW(S) XRAY OF THE CHEST 07/17/2024 12:10:00 PM COMPARISON: CTA chest earlier today. CLINICAL HISTORY: 89 year old female with shortness of breath. FINDINGS: LUNGS AND PLEURA: Patchy bilateral airspace opacities. Prominent hilar vasculature. Small bilateral pleural effusions. No pneumothorax. HEART AND MEDIASTINUM: Cardiomegaly. Aortic arch atherosclerosis. BONES AND SOFT TISSUES: Midthoracic vertebroplasty noted. No acute osseous abnormality. IMPRESSION: 1. Bilateral pneumonia and pleural effusions; ventilation stable from CTA chest today. Electronically signed by: Helayne Hurst MD MD 07/17/2024 12:51 PM EST RP Workstation: [id number]   CT  Angio Chest PE W and/or Wo Contrast Result Date: 07/17/2024 EXAM: CTA of the Chest with contrast for PE 07/17/2024 11:54:17 AM TECHNIQUE: CTA of the chest was performed after the administration of 75 mL of iohexol  (OMNIPAQUE ) 350 MG/ML injection. Multiplanar reformatted images are provided for review. MIP images are provided for review. Automated exposure control, iterative reconstruction, and/or weight based adjustment of the mA/kV was utilized to reduce the radiation dose to as low as reasonably achievable. COMPARISON: Prior chest CTA 07/05/2021, prior chest CT 10/05/2023 . CLINICAL HISTORY: 89 year old female. Pulmonary embolism suspected, low to intermediate probability, negative D-dimer. Recent hip replacement, increasing SOB. FINDINGS: PULMONARY ARTERIES: Excellent pulmonary artery contrast timing. No pulmonary artery filling defect. Main pulmonary artery is normal in caliber. MEDIASTINUM: Stable cardiomegaly. No pericardial effusion. Advanced calcified atherosclerosis of the coronary arteries and visible thoracic aorta. LYMPH NODES: No mediastinal, hilar or axillary lymphadenopathy. LUNGS AND PLEURA: Moderate to large layering right pleural effusion with simple fluid density favoring transudate. Smaller layering left pleural effusion with compressive left lower lobe atelectasis. Less pronounced right lower lobe atelectasis. Superimposed confluent bilateral upper lobes peribronchial opacity ranging from ground glass to solid, but little upper lobe consolidation at this time. This most resembles bilateral infection / inflammation. Major airways remain patent. No pneumothorax. UPPER ABDOMEN: Limited images of the upper abdomen demonstrate severe calcified aortic atherosclerosis with evidence of hemodynamically significant aortic stenosis on series 4 image 130. SOFT TISSUES AND BONES: Generalized osteopenia. Chronic left 2nd through 4th rib fractures are stable. Widespread chronic spinal compression fractures, 1  previously augmented. Stable chronic sternal fracture. Mild respiratory motion. IMPRESSION: 1. No pulmonary embolism. 2. Bilateral upper lobe opacities, favor bilateral pneumonia and possible viral/atypical etiology. Superimposed moderate to large right and smaller left pleural effusions with lower lobe atelectasis. 3. Severe calcified aortic atherosclerosis with hemodynamically significant abdominal aortic stenosis suspected. 4. Osteopenia and chronically advanced spinal compression fractures, chronic rib fractures. Electronically signed by: Helayne Hurst MD MD 07/17/2024 12:16 PM EST RP Workstation: [id number]        Scheduled Meds:  ascorbic acid   500 mg Oral Daily   aspirin   324 mg Oral Once   aspirin   81 mg Oral Pre-Cath   atorvastatin   80 mg Oral Daily   chlorpheniramine-HYDROcodone   5 mL Oral Q12H   docusate sodium   100 mg Oral BID   free water   500 mL Oral Once   furosemide   40 mg Intravenous BID AC   guaiFENesin   10 mL Oral Q8H   ipratropium  0.5 mg Nebulization Q6H   lactobacillus  1 g Oral TID WC   levalbuterol   1.25 mg Nebulization Q6H   levothyroxine   50 mcg Oral Daily   methylPREDNISolone  (SOLU-MEDROL ) injection  40 mg Intravenous Q12H   sodium chloride  flush  3 mL Intravenous Q12H   sodium chloride  flush  3 mL Intravenous Q12H   zinc  sulfate (  50mg  elemental zinc )  220 mg Oral Daily   Continuous Infusions:  amiodarone  60 mg/hr (2024-08-16 1200)   Followed by   amiodarone      ceFEPime  (MAXIPIME ) IV 2 g (2024/08/16 1051)   heparin  700 Units/hr (07/17/24 1320)   remdesivir  100 mg in sodium chloride  0.9 % 100 mL IVPB 100 mg (August 16, 2024 0934)   vancomycin        LOS: 1 day    Time spent: 45 minutes spent on chart review, discussion with nursing staff, consultants, updating family and interview/physical exam; more than 50% of that time was spent in counseling and/or coordination of care.    Harlene RAYMOND Bowl, DO [facility] Available via Epic secure chat 7am-7pm After  these hours, please refer to coverage provider listed on [email or web address] 08-16-2024, 12:17 PM   "

## 2024-08-07 NOTE — Progress Notes (Signed)
"  °  Progress Note  Patient Name: Natasha Harper Date of Encounter: July 26, 2024 [facility] Cardiologist: Vina Gull, MD   Interval Summary    Laying in bed on Bipap, opens eyes to verbal stimulation and answers yes/no questions. Denies any pain.   Vital Signs Vitals:   07/26/2024 0500 26-Jul-2024 0639 2024/07/26 0650 July 26, 2024 0721  BP:   117/69 101/68  Pulse:  (!) 40  (!) 117  Resp:    (!) 23  Temp:    98.4 F (36.9 C)  TempSrc:    Axillary  SpO2:  93%  93%  Weight: 56.8 kg     Height:        Intake/Output Summary (Last 24 hours) at 07/26/24 0743 Last data filed at Jul 26, 2024 9375 Gross per 24 hour  Intake 480.73 ml  Output 721 ml  Net -240.27 ml      July 26, 2024    5:00 AM 07/17/2024    5:03 PM 06/16/2024   11:12 AM  Last 3 Weights  Weight (lbs) 125 lb 3.5 oz 130 lb 11.7 oz 120 lb  Weight (kg) 56.8 kg 59.3 kg 54.432 kg      Telemetry/ECG  Sinus rhythm--> atrial fibrillation with RVR around 6am - Personally Reviewed  Physical Exam  GEN: on Bipap  Neck: No JVD Cardiac: Irreg Irreg, no murmurs, rubs, or gallops.  Respiratory: diminished throughout  GI: Soft, nontender, non-distended  MS: No edema  Assessment & Plan   89 y.o. female with a hx of CAD s/p PCI (RCA/D1 in 1993) who was seen for the evaluation of troponin elevation at the request of Dr Leanne.   New onset Atrial fibrillation with RVR -- developed this morning around 6am with rates in the 140s. Given IV lopressor  5mg  x1 by primary -- rates remain elevated in the 120-130 range with systolic BP of 100 -- suspect afib is 2/2 to acute illness with COVID  -- EF noted at 35%, therefore will add IV amiodarone  with 150mg  bolus x1, continue IV heparin    Acute hypoxic respiratory failure COVID PNA Sepsis  -- noted to be hypoxic on admission with sats in the 70s on RA, reqiuring Bipap  -- COVID +, on remdesivir /steroids/antibiotics per primary team -- [facility] pending  NSTEMI -- hsTn 622>>[phone]>>[phone]>>660, Q  waves in lateral leads  -- echo with LVEF of 35%, hypokinesis of the anterior wall, septum, apex and inferior wall -- on ASA, IV heparin  -- initial recommendations for possible cath today, but given her respiratory status, this is deferred for now. Continue medical management   Acute HFrEF -- echo 1/11 with LVEF of 35-40%, severe concentric hypertrophy, mildly enlarged RV, mild MR -- proBNP [phone], chest xray with bilateral pleural effusions -- IV lasix  40mg  BID -- GDMT: BP soft this morning, and on Bipap. Will hold on therapy to allow room for diuresis. Optimize as tolerated    For questions or updates, please contact  [facility] Please consult [email or web address] for contact info under         Signed, Manuelita Rummer, NP   "

## 2024-08-07 NOTE — Progress Notes (Signed)
 Patient was seen for rapid a fib with HR 160s.   This appears to be a new diagnosis, possibly triggered by NSTEMI and/or acute respiratory illness.   She is on BiPAP and disoriented. BP is normal. Skin dry and warm. HR ~140 and irregularly irregular.   She had low EF on TTE yesterday. BP is stable. Plan to use IV Lopressor .

## 2024-08-07 NOTE — Plan of Care (Signed)

## 2024-08-07 DEATH — deceased
